# Patient Record
Sex: Female | Born: 1935 | Race: White | Hispanic: No | Smoking: Former smoker
Health system: Southern US, Community
[De-identification: ages and names within clinical notes are randomized; demographics above are authoritative.]

## PROBLEM LIST (undated history)

## (undated) DIAGNOSIS — Z8669 Personal history of other diseases of the nervous system and sense organs: Secondary | ICD-10-CM

## (undated) DIAGNOSIS — K5790 Diverticulosis of intestine, part unspecified, without perforation or abscess without bleeding: Secondary | ICD-10-CM

## (undated) DIAGNOSIS — F32A Depression, unspecified: Secondary | ICD-10-CM

## (undated) DIAGNOSIS — M316 Other giant cell arteritis: Secondary | ICD-10-CM

## (undated) DIAGNOSIS — E785 Hyperlipidemia, unspecified: Secondary | ICD-10-CM

## (undated) DIAGNOSIS — Z8719 Personal history of other diseases of the digestive system: Secondary | ICD-10-CM

## (undated) DIAGNOSIS — F329 Major depressive disorder, single episode, unspecified: Secondary | ICD-10-CM

## (undated) DIAGNOSIS — Z87442 Personal history of urinary calculi: Secondary | ICD-10-CM

## (undated) DIAGNOSIS — K449 Diaphragmatic hernia without obstruction or gangrene: Secondary | ICD-10-CM

## (undated) DIAGNOSIS — Z8616 Personal history of COVID-19: Secondary | ICD-10-CM

## (undated) DIAGNOSIS — K589 Irritable bowel syndrome without diarrhea: Secondary | ICD-10-CM

## (undated) DIAGNOSIS — Z860100 Personal history of colon polyps, unspecified: Secondary | ICD-10-CM

## (undated) DIAGNOSIS — C4490 Unspecified malignant neoplasm of skin, unspecified: Secondary | ICD-10-CM

## (undated) DIAGNOSIS — C50919 Malignant neoplasm of unspecified site of unspecified female breast: Secondary | ICD-10-CM

## (undated) HISTORY — DX: Personal history of other diseases of the digestive system: Z87.19

## (undated) HISTORY — DX: Diverticulosis of intestine, part unspecified, without perforation or abscess without bleeding: K57.90

## (undated) HISTORY — DX: Depression, unspecified: F32.A

## (undated) HISTORY — DX: Hyperlipidemia, unspecified: E78.5

## (undated) HISTORY — DX: Personal history of urinary calculi: Z87.442

## (undated) HISTORY — DX: Personal history of other diseases of the nervous system and sense organs: Z86.69

## (undated) HISTORY — DX: Personal history of colonic polyps: Z86.010

## (undated) HISTORY — DX: Malignant neoplasm of unspecified site of unspecified female breast: C50.919

## (undated) HISTORY — DX: Irritable bowel syndrome, unspecified: K58.9

## (undated) HISTORY — DX: Personal history of colon polyps, unspecified: Z86.0100

## (undated) HISTORY — DX: Diaphragmatic hernia without obstruction or gangrene: K44.9

## (undated) HISTORY — DX: Unspecified malignant neoplasm of skin, unspecified: C44.90

## (undated) HISTORY — DX: Other giant cell arteritis: M31.6

## (undated) HISTORY — DX: Major depressive disorder, single episode, unspecified: F32.9

## (undated) HISTORY — DX: Personal history of COVID-19: Z86.16

---

## 1981-05-29 HISTORY — PX: MASTECTOMY: SHX3

## 1983-05-30 DIAGNOSIS — C50919 Malignant neoplasm of unspecified site of unspecified female breast: Secondary | ICD-10-CM

## 1983-05-30 HISTORY — DX: Malignant neoplasm of unspecified site of unspecified female breast: C50.919

## 1998-05-29 HISTORY — PX: AUGMENTATION MAMMAPLASTY: SUR837

## 1998-10-28 ENCOUNTER — Other Ambulatory Visit: Admission: RE | Admit: 1998-10-28 | Discharge: 1998-10-28 | Payer: Self-pay | Admitting: Internal Medicine

## 1998-10-28 ENCOUNTER — Encounter: Payer: Self-pay | Admitting: Internal Medicine

## 1998-10-28 DIAGNOSIS — K449 Diaphragmatic hernia without obstruction or gangrene: Secondary | ICD-10-CM | POA: Insufficient documentation

## 1998-10-28 DIAGNOSIS — K219 Gastro-esophageal reflux disease without esophagitis: Secondary | ICD-10-CM | POA: Insufficient documentation

## 1998-10-28 DIAGNOSIS — D126 Benign neoplasm of colon, unspecified: Secondary | ICD-10-CM | POA: Insufficient documentation

## 1999-10-17 ENCOUNTER — Other Ambulatory Visit: Admission: RE | Admit: 1999-10-17 | Discharge: 1999-10-17 | Payer: Self-pay | Admitting: Obstetrics and Gynecology

## 2000-11-20 ENCOUNTER — Other Ambulatory Visit: Admission: RE | Admit: 2000-11-20 | Discharge: 2000-11-20 | Payer: Self-pay | Admitting: Obstetrics and Gynecology

## 2001-08-05 ENCOUNTER — Encounter: Payer: Self-pay | Admitting: Internal Medicine

## 2001-08-05 ENCOUNTER — Ambulatory Visit (HOSPITAL_COMMUNITY): Admission: RE | Admit: 2001-08-05 | Discharge: 2001-08-05 | Payer: Self-pay | Admitting: Internal Medicine

## 2001-12-17 ENCOUNTER — Other Ambulatory Visit: Admission: RE | Admit: 2001-12-17 | Discharge: 2001-12-17 | Payer: Self-pay | Admitting: Obstetrics and Gynecology

## 2004-03-02 ENCOUNTER — Other Ambulatory Visit: Admission: RE | Admit: 2004-03-02 | Discharge: 2004-03-02 | Payer: Self-pay | Admitting: Obstetrics and Gynecology

## 2007-02-28 ENCOUNTER — Ambulatory Visit: Payer: Self-pay | Admitting: Internal Medicine

## 2007-03-08 ENCOUNTER — Ambulatory Visit: Payer: Self-pay | Admitting: Internal Medicine

## 2007-03-08 DIAGNOSIS — K5730 Diverticulosis of large intestine without perforation or abscess without bleeding: Secondary | ICD-10-CM | POA: Insufficient documentation

## 2007-05-30 HISTORY — PX: LAPAROSCOPIC CHOLECYSTECTOMY: SUR755

## 2007-08-05 DIAGNOSIS — Z8719 Personal history of other diseases of the digestive system: Secondary | ICD-10-CM

## 2007-08-05 DIAGNOSIS — M810 Age-related osteoporosis without current pathological fracture: Secondary | ICD-10-CM | POA: Insufficient documentation

## 2007-08-05 DIAGNOSIS — M316 Other giant cell arteritis: Secondary | ICD-10-CM | POA: Insufficient documentation

## 2007-08-31 ENCOUNTER — Encounter (INDEPENDENT_AMBULATORY_CARE_PROVIDER_SITE_OTHER): Payer: Self-pay | Admitting: *Deleted

## 2007-08-31 ENCOUNTER — Inpatient Hospital Stay (HOSPITAL_COMMUNITY): Admission: EM | Admit: 2007-08-31 | Discharge: 2007-09-04 | Payer: Self-pay | Admitting: Emergency Medicine

## 2007-09-02 ENCOUNTER — Encounter (INDEPENDENT_AMBULATORY_CARE_PROVIDER_SITE_OTHER): Payer: Self-pay | Admitting: *Deleted

## 2007-09-03 ENCOUNTER — Encounter (INDEPENDENT_AMBULATORY_CARE_PROVIDER_SITE_OTHER): Payer: Self-pay | Admitting: Surgery

## 2007-09-04 ENCOUNTER — Encounter (INDEPENDENT_AMBULATORY_CARE_PROVIDER_SITE_OTHER): Payer: Self-pay | Admitting: *Deleted

## 2007-09-05 ENCOUNTER — Ambulatory Visit: Payer: Self-pay | Admitting: Internal Medicine

## 2007-12-20 ENCOUNTER — Telehealth: Payer: Self-pay | Admitting: Internal Medicine

## 2008-01-20 ENCOUNTER — Ambulatory Visit: Payer: Self-pay | Admitting: Internal Medicine

## 2008-04-28 ENCOUNTER — Telehealth: Payer: Self-pay | Admitting: Internal Medicine

## 2008-05-07 ENCOUNTER — Ambulatory Visit: Payer: Self-pay | Admitting: Internal Medicine

## 2008-05-08 LAB — CONVERTED CEMR LAB
ALT: 15 units/L (ref 0–35)
AST: 21 units/L (ref 0–37)
Albumin: 3.9 g/dL (ref 3.5–5.2)
Amylase: 110 units/L (ref 27–131)
Total Bilirubin: 1 mg/dL (ref 0.3–1.2)

## 2008-05-14 ENCOUNTER — Encounter: Payer: Self-pay | Admitting: Internal Medicine

## 2008-05-15 ENCOUNTER — Telehealth: Payer: Self-pay | Admitting: Internal Medicine

## 2008-05-26 ENCOUNTER — Telehealth: Payer: Self-pay | Admitting: Internal Medicine

## 2008-05-27 ENCOUNTER — Ambulatory Visit: Payer: Self-pay | Admitting: Internal Medicine

## 2008-05-27 ENCOUNTER — Telehealth: Payer: Self-pay | Admitting: Nurse Practitioner

## 2008-05-27 DIAGNOSIS — R110 Nausea: Secondary | ICD-10-CM

## 2008-05-27 DIAGNOSIS — K859 Acute pancreatitis without necrosis or infection, unspecified: Secondary | ICD-10-CM

## 2008-05-27 DIAGNOSIS — R197 Diarrhea, unspecified: Secondary | ICD-10-CM

## 2008-05-27 DIAGNOSIS — R1013 Epigastric pain: Secondary | ICD-10-CM

## 2008-05-27 LAB — CONVERTED CEMR LAB
BUN: 12 mg/dL (ref 6–23)
Chloride: 104 meq/L (ref 96–112)
Creatinine, Ser: 0.9 mg/dL (ref 0.4–1.2)
GFR calc non Af Amer: 66 mL/min

## 2008-06-02 ENCOUNTER — Encounter: Payer: Self-pay | Admitting: Internal Medicine

## 2008-06-08 ENCOUNTER — Ambulatory Visit: Payer: Self-pay | Admitting: Internal Medicine

## 2008-06-16 ENCOUNTER — Ambulatory Visit: Payer: Self-pay | Admitting: Internal Medicine

## 2008-06-16 ENCOUNTER — Encounter: Payer: Self-pay | Admitting: Internal Medicine

## 2008-06-17 ENCOUNTER — Encounter: Payer: Self-pay | Admitting: Internal Medicine

## 2008-09-09 ENCOUNTER — Telehealth: Payer: Self-pay | Admitting: Internal Medicine

## 2010-01-17 ENCOUNTER — Encounter: Payer: Self-pay | Admitting: Internal Medicine

## 2010-01-17 ENCOUNTER — Other Ambulatory Visit: Payer: Self-pay | Admitting: Internal Medicine

## 2010-01-17 ENCOUNTER — Telehealth: Payer: Self-pay | Admitting: Internal Medicine

## 2010-01-18 ENCOUNTER — Ambulatory Visit: Payer: Self-pay | Admitting: Internal Medicine

## 2010-01-18 ENCOUNTER — Encounter: Payer: Self-pay | Admitting: Internal Medicine

## 2010-06-30 NOTE — Progress Notes (Signed)
Summary: triage   Phone Note Call from Patient Call back at Home Phone [phone]   Caller: Patient Call For: Dr Juanda Chance Reason for Call: Talk to Nurse Summary of Call: Patient wants to be seen sooner than first available 9-21 for heaviness on her chest right between her breast, feels like a giant bubble that wont go away. Patient states that she took other family member Nexium and it helperd a little but concern because of the heaviness x 5 days. Initial call taken by: Tawni Levy,  January 17, 2010 10:26 AM  Follow-up for Phone Call        Patient  has had 5 day hx of "indigestions",  "feels like a large gas bubble that won't go away".  Descirbes pain as a heavyness and worse when she lifts her arms.  She is tired also. She started herself on Nexium this weekend with very minimal relief, "it is just not getting better".   Patient questioned about her cardiac hx and she states she has none.  She is asked to see her primary care MD today or go to an ER for evaluation to r/o cardiac causes of CP.  I have asked her to call me back if a cardiac cause of her pain has been ruled out to see Dr Juanda Chance.  Patient verbalized understanding and will call her primary care now. Follow-up by: Darcey Nora RN, CGRN,  January 17, 2010 11:16 AM  Additional Follow-up for Phone Call Additional follow up Details #1::        I have reviewed her chart. last EGD for similar Sx's 05/2008. ? bile gastritis? vs functional dyspepsia. I agree with being checked by her internist. If she is referred back to [place], please send Carafate 1 gm by mouth two times a day, #40, 1 refill and refill Dicyclomine 10 mg, # 40 1 by mouth two times a day, 1 refill. Additional Follow-up by: Hart Carwin MD,  January 17, 2010 7:04 PM    Additional Follow-up for Phone Call Additional follow up Details #2::    I called and spoke with the patient she is scheduled for a stress test this am.  I reviewed carafate and dicyclomine with her she  has both at home, and she will start on this.  She wants to wait to scheduled an appointment until after her stress test today. Follow-up by: Darcey Nora RN, CGRN,  January 18, 2010 8:47 AM  Additional Follow-up for Phone Call Additional follow up Details #3:: Details for Additional Follow-up Action Taken: I agree Additional Follow-up by: Hart Carwin MD,  January 18, 2010 9:21 PM

## 2010-10-11 NOTE — Discharge Summary (Signed)
NAMEBERTIE, MCCONATHY           ACCOUNT NO.:  [id number]   MEDICAL RECORD NO.:  [id number]          PATIENT TYPE:  INP   LOCATION:  5123                         FACILITY:  [facility]   PHYSICIAN:  Rachael Fee, MD   DATE OF BIRTH:  Apr 03, 1935   DATE OF ADMISSION:  08/30/2007  DATE OF DISCHARGE:  09/04/2007                               DISCHARGE SUMMARY   ADMITTING DIAGNOSES:  1. Biliary pancreatitis.  2. History of diverticulosis.  3. History of temporal arteritis with secondary myalgias.  4. Remote history of breast cancer with bilateral mastectomy and      reconstruction at least 25 years previously.  5. She has a history of adenomatous colon polyps and in her latest      colonoscopy October 2003, she had some mild diverticular changes in      the left colon.  The study itself was incomplete and terminated at      the hepatic flexure.  A PE of October 2003 showed markedly      redundant colon, but no masses.   DISCHARGE DIAGNOSES:  1. Biliary pancreatitis, resolved, probably passed a common bile duct      stone.  The patient did not require ERCP or MRCP.  2. Gallstones.  3. Status post laparoscopic cholecystectomy on September 03, 2007, by Dr.      Darnell Level.  Intraoperative cholangiogram showed no abnormality      nor filling defects.  4. Hyperglycemia in the setting of acute pancreatitis, the sugars were      normalizing as the patient's pancreatitis resolved.   CONSULTATIONS:  Dr. Darnell Level from general surgery.   PROCEDURES:  Laparoscopic cholecystectomy with intraoperative  cholangiogram by Dr. Gerrit Friends.   BRIEF HISTORY:  Ms. Elgin is a nice 76 year old female who presented  to the emergency room after 24 hours of intense, 10/10, epigastric  abdominal pain.  This began acutely.  It did not radiate.  It was  associated with nausea.  She did not throw up until she arrived at the  emergency room where she had a single episode of emesis.  She had no  fevers or  chills, and she denied diarrhea.  Up until the time of the  acute symptoms, she had been doing well and did not require any antacid  therapy.  She had not treated herself with any particular medication at  home prior to coming to the emergency room.  She was anorexic and she  had not been eating much in the last 24 hours prior to arrival.  A CT  scan of the abdomen and pelvis showed an inflammatory process centered  around the gallbladder and pancreas.  Differential included acute  cholecystitis and pancreatitis.  There was a large gallstone in the  gallbladder fundus.  There was no pericholecystic fluid.  The common  bile duct measured 8 mm.  There was no obvious stone in the common bile  duct.  The pancreatic duct and intrahepatic ducts were of normal  diameter and appearance.  The patient did then have an ultrasound of the  abdomen showing gallstones.  No gallbladder wall  thickening and a  prominent common bile duct, but no visualization of common duct stone.  Her pancreatic enzymes were particularly elevated with the initial  lipase measuring 4277.  LFTs were not terribly elevated.  The bilirubin  was 1.1, alkaline phos was normal.  The AST was 47, and the ALT was 30.  The patient was initially seen and was to be admitted by the [facility] team.  Dr. Christella Hartigan have the patient transferred to the [facility]  service because this was obviously a very focused GI problem.   IMAGING STUDIES:  Those are noted above in the HPI.  The intraoperative  cholangiogram showed no evidence for retained common bile duct stone.   LABORATORIES:  Initial white blood cell count 6.6.  It was 6.0 on  recheck.  Hemoglobin 12, hematocrit 35.2.  MCV 91.1.  Platelets 231,000.  Sodium 142, potassium 3.6.  Chloride 108, CO2 27.  Glucose ranged from a  high of 153 down to a low of 110.  BUN was 13, creatinine 0.9 on  admission, on recheck the BUN was 3, and creatinine 0.7.  Total  bilirubin went from 1.1 to 0.9.   Alkaline phosphatase range was 79 to  96.  AST range 18 to 47.  ALT range 15 to 30.  Albumin nadir of 2.7.  Calcium 8.2.  Amylase went from 454 to 106.  Lipase dropped from 4277 to  38.  Urinalysis was negative.   HOSPITAL COURSE:  1. Biliary pancreatitis.  The patient was initially kept n.p.o. with      the exception of ice chips.  Diet was advanced to clear liquids.      She then had surgery and after surgery she was tolerating a regular      diet.  The patient required some pain control, but quickly      decelerated in her need for p.r.n. Dilaudid.  She had no further      episodes of vomiting.  Chemistries to follow her pancreatic enzymes      and LFTs were obtained regularly and showed consistent improvement.      Because the LFTs were never significantly elevated, and she      improved so quickly,  decision was made to study the bile duct with      an intraoperative cholangiogram.  This study failed to confirm any      problems with the common bile duct.  It is felt that she probably      had a gallstone which she passed on her own.  2. Cholelithiasis.  The patient underwent laparoscopic cholecystectomy      without incidence on April 7.  By April 8, she was feeling great.      She tolerated clear liquid diet and then a regular diet and was      felt safe and ready for discharge home in improved condition in the      afternoon of April 8.  3. Hyperglycemia.  The patient's serum blood glucose did reach a high      of 153 on April 3 when she first came in.  Thereafter, on recheck,      it was 110 and then 116.  Given her age and the general      population's proclivity for diabetes mellitus type 2, she will need      to be monitored for elevated blood sugars on routine office visits      in the future.  She  did not require any treatment with sliding      scale insulin during this admission.   MEDICATIONS AT DISCHARGE:  1. Ibuprofen 600 mg one p.o. t.i.d. p.r.n. A prescription for  30 of      these without refills was provided by surgery.  2. Vicodin 5/325 mg one to two p.o. q.4 h. p.r.n. pain, 40 of these      were dispensed without refills by general surgery.  3. Bentyl 50 mg one p.o. q. 6 h. p.r.n.  4. Prempro 0.425 mg one p.o. daily.  5. Ultracet one p.o. up to t.i.d. p.r.n. not to be used in conjunction      with the Vicodin.  This was a preexisting prescription.  6. Diet, regular.  If she has diarrhea, she should start avoiding      excessive fatty foods in the diet.   FOLLOW-UP APPOINTMENTS:  Followup appointment with Dr. Gerrit Friends.  The  appointment will be arranged, but occur on or about April 14.  The  surgery office will call the patient with the appointment.  The patient  is to call the surgeon if she has fever over 101.5, new or increased  belly pain, nausea, vomiting, diarrhea, or constipation, or any bloody  or pussy looking discharge from the wounds.  She can return to work in 2  weeks.  She can also apply ice pack to the incision if she has soreness  or discomfort that is milder in nature.  The patient is allowed to  shower  beginning September 05, 2007 at which point it will be okay to remove the  outer bandages.  The surgical tape strips should be allowed to fall off  on their own.  She is not to lift anything more than 10 pounds for 2  weeks and not drive for 1 week.      Jennye Moccasin, PA-C      Rachael Fee, MD  Electronically Signed    SG/MEDQ  D:  09/04/2007  T:  09/05/2007  Job:  [id number]   cc:   Erven Colla. Juanda Chance, MD  Velora Heckler, MD

## 2010-10-11 NOTE — Op Note (Signed)
[name], [name]           ACCOUNT NO.:  [id number]   MEDICAL RECORD NO.:  [id number]          PATIENT TYPE:  INP   LOCATION:  5123                         FACILITY:  [facility]   PHYSICIAN:  Velora Heckler, MD      DATE OF BIRTH:  06-12-1934   DATE OF PROCEDURE:  09/03/2007  DATE OF DISCHARGE:                               OPERATIVE REPORT   PREOPERATIVE DIAGNOSES:  1. Biliary pancreatitis.  2. Cholelithiasis.   POSTOPERATIVE DIAGNOSES:  1. Biliary pancreatitis.  2. Cholelithiasis.   PROCEDURE:  Laparoscopic cholecystectomy with intraoperative  cholangiography.   SURGEON:  Velora Heckler, MD, FACS   ASSISTANT:  Magnus Ivan, RNFA   ANESTHESIA:  General.   ESTIMATED BLOOD LOSS:  Minimal.   PREPARATION:  Betadine.   COMPLICATIONS:  None.   INDICATIONS:  The patient is a 76 year old white female admitted on the  medical service with abdominal pain.  Workup revealed pancreatitis with  an elevated serum lipase level.  Ultrasound showed multiple gallstones.  The patient was seen in consultation by gastroenterology and by general  surgery.  The patient's laboratory studies normalized over the ensuing  48 hours.  A decision was made to proceed with cholecystectomy with  cholangiography and, if necessary, follow that with ERCP.   BODY OF REPORT:  The procedure was done in OR #17 at the [facility].  The patient is brought to the operating room, placed  in a supine position on the operating room table.  Following  administration of general anesthesia, the patient is prepped and draped  in the usual strict aseptic fashion.  After ascertaining that an  adequate level of anesthesia had been achieved, an infraumbilical  incision was made with a #15 blade.  Dissection is carried down to the  fascia.  Fascia is incised in the midline and the peritoneal cavity is  entered cautiously.  A 0 Vicryl pursestring suture is placed in the  fascia.  A Hasson cannula  is introduced and secured with a pursestring  suture.  Abdomen is insufflated with carbon dioxide.  Laparoscope is  introduced and the abdomen explored.  There are some adhesions  throughout the pelvis and lower midline.  The liver is multilobulated  but does not appear cirrhotic.  Operative ports are placed along the  right costal margin in the midline, midclavicular line and anterior  axillary line.  Fundus of the gallbladder is grasped and retracted  cephalad.  There are some omental adhesions to the undersurface of the  liver, which are gently taken down and hemostasis obtained with the  electrocautery.  The peritoneum is incised at the neck of the  gallbladder.  The cystic duct is dissected out.  The cystic artery is  dissected out, doubly clipped, and divided.  A clip is placed at the  junction to the cystic duct and the gallbladder.  The cystic duct is  incised.  Clear yellow bile emanates from the cystic duct.  A Cook  cholangiography catheter is introduced through a stab wound in the right  upper quadrant and inserted into the cystic duct.  It is secured with a  Ligaclip.  Using C-arm fluoroscopy, real-time cholangiography is  performed.  There is rapid filling of a dilated common bile duct.  There  is free flow distally into the duodenum.  There is reflux of contrast  into the distal pancreatic duct.  There is also reflux of contrast into  both the right and left hepatic ductal systems.  There are no filling  defects.  There is no sign of obstruction.  Clip is withdrawn, Cook  catheter is removed.  Cystic duct is clipped with four Ligaclips and  divided.  Posterior branch of the cystic artery is doubly clipped and  divided.  Gallbladder is then excised from the gallbladder bed using the  hook electrocautery for hemostasis.  Gallbladder is completely excised  and then removed through the umbilical port without difficulty.  It  contains multiple gallstones.  The 0 Vicryl  pursestring suture is tied  securely.  Right upper quadrant is irrigated with warm saline, which is  evacuated.  Good hemostasis is noted.  Ports are removed under direct  vision.  Good hemostasis is noted at all port sites.  Pneumoperitoneum  is released.  The port sites are anesthetized with local anesthetic.  All wounds are closed with interrupted 4-0 Vicryl subcuticular sutures.  Wounds are washed and dried and Benzoin and Steri-Strips are applied.  Sterile dressings are applied.  The patient is awakened from anesthesia  and brought to the recovery room in stable condition.  The patient  tolerated the procedure well.      Velora Heckler, MD  Electronically Signed     TMG/MEDQ  D:  09/03/2007  T:  09/03/2007  Job:  [id number]   cc:   Altha Harm, MD  Rachael Fee, MD

## 2010-10-11 NOTE — H&P (Signed)
[name], GUEST           ACCOUNT NO.:  [id number]   MEDICAL RECORD NO.:  [id number]          PATIENT TYPE:  EMS   LOCATION:  MAJO                         FACILITY:  [facility]   PHYSICIAN:  Altha Harm, MDDATE OF BIRTH:  01-Sep-1934   DATE OF ADMISSION:  08/30/2007  DATE OF DISCHARGE:                              HISTORY & PHYSICAL   CHIEF COMPLAINT:  Abdominal pain and vomiting.   HISTORY OF PRESENT ILLNESS:  This is a 76 year old lady who appears  younger than her stated age, who presents to the emergency room after 1  day of acute onset epigastric pain rated 10/10.  The patient states that  the pain is in the epigastric area, nonradiating and associated with  nausea.  The patient states that she initially did not have any emesis,  however just at her arrival to the emergency room, she had 1 episode of  emesis in the waiting room and 1 while in the emergency room.  The  patient denies any fever or chills.  She denies any diarrhea.   The patient has a particularly unremarkable past medical history except  for diverticulosis and temporal arteritis.  The patient has never had  any episodes like this in the past.   PAST MEDICAL HISTORY:  As noted above, significant for:  1. Diverticulosis.  2. History of temporal arteritis with resultant myalgias.  3. Bilateral mastectomy approximately 25 years ago for breast cancer.   FAMILY HISTORY:  Not relevant in a patient at this age.   SOCIAL HISTORY:  The patient resides by herself.  She denies any  tobacco, alcohol or drug use.   CURRENT MEDICATIONS:  1. Bentyl.  2. Prempro.  3. Ultracet, dose is unknown.   ALLERGIES:  NEOSPORIN.   PRIMARY CARE PHYSICIAN:  Yates Decamp, MD in [place], [place].   GASTROENTEROLOGIST:  Hedwig Morton. Juanda Chance, MD.   REVIEW OF SYSTEMS:  The 37 systems are reviewed and all systems are  negative except as noted in the HPI.   DIAGNOSTICS:  The patient had a CT of the abdomen and pelvis  with  contrast which showed inflammatory process centered around the  gallbladder and pancreas.  Differential includes acute cholecystitis and  pancreatitis including gallstone pancreatitis.  Stranding around the  hepatic flexure likely related to the gallbladder or pancreas.  Less  likely diverticulitis.  No acute pelvic process.  There is a large 14 mm  gallstone in the gallbladder fundus.  The gallbladder wall is hyperemic.  The gallbladder is not dilated.  There is pericholecystic fluid.  The  common bile duct is dilated 8 mm to the pancreatic head.  The pancreas  itself does not demonstrate ductal dilatation.   LABORATORY DATA:  White blood cell count 6.6, hemoglobin 14.3,  hematocrit 42.1, platelets 302, sodium 137, potassium 3.9, chloride 101,  bicarb 26, BUN 13, creatinine 0.93, lipase 4,277.  Urinalysis was done  with no elements present consistent with urinary tract infection.   PHYSICAL EXAMINATION:  GENERAL:  The patient is lying in bed comfortably  and nontoxic appearing.  VITAL SIGNS:  Temperature 98.4, blood pressure 125/52, heart  rate 79,  respiratory rate 16, sats 98% on room air.  HEENT:  She is normocephalic, atraumatic.  Pupils are equal, round, and  reactive to light and accommodation.  Extraocular movements are intact.  Fundi are benign.  Oropharynx is moist.  No exudate, erythema or lesions  are noted.  NECK:  Trachea is midline.  No masses.  No thyromegaly.  No JVD.  No  carotid bruit.  RESPIRATORY:  The patient has a normal respiratory effort.  Equal  excursion bilaterally.  No wheezing or rhonchi noted.  CARDIOVASCULAR:  She has got a normal S1 and S2.  No murmurs, rubs or  gallops are noted.  PMI is nondisplaced.  No heaves or thrills on  palpation.  ABDOMEN:  Obese, soft.  The patient has epigastric tenderness.  Negative  Murphy's sign.  No masses.  No hepatosplenomegaly noted.  The patient  has decreased bowel sounds.  LYMPH NODE SURVEY:  She has got no  cervical, axillary or inguinal  lymphadenopathy noted.  MUSCULOSKELETAL:  She has got no warmth, swelling or erythema around the  joints.  Mild kyphosis noted, but no spinal tenderness.  PSYCHIATRIC:  She is alert and oriented x 3.  Good insight and  cognition.  Good recent and remote recall.  NEUROLOGIC:  She has got no focal neurological deficits.  Cranial nerves  II-XII are grossly intact.  Sensation is intact to light touch and  proprioception.  Strength is 4/4 in bilateral upper and lower  extremities.  DTRs are 2+ bilaterally in the lower extremity.   IMPRESSION:  This is a patient who presents with gallstone pancreatitis.  The patient will be made n.p.o. for right now as the pain appears to be  significant.  The patient will be given IV fluid hydration and pain  medication.  The patient seems to be responding well to boluses of pain  on a p.r.n. basis and I will not start the patient on a PCA pump.  However if the pain escalates, then we will proceed to a PCA pump for  better pain control.  I will also seek a GI and possibly speak to the  surgeons about removal of the gallbladder.  However in the meantime, we  will get an ultrasound of the abdomen to better evaluate the gallbladder  and seek better dimensions of the dilatation.  The patient will be  admitted to a med/surge bed.  Further decisions on care will be  dependent upon the initial response to therapy.      Altha Harm, MD  Electronically Signed     MAM/MEDQ  D:  08/31/2007  T:  08/31/2007  Job:  [id number]   cc:   Hedwig Morton. Juanda Chance, MD

## 2010-10-11 NOTE — Assessment & Plan Note (Signed)
[facility]                         GASTROENTEROLOGY OFFICE NOTE   NAME:Solazzo, SHAWNTAVIA SAUNDERS                  MRN:          [id number]  DATE:02/28/2007                            DOB:          09-Nov-1934    Ms. Sobotta is a very nice 76 year old white female here to discuss  repeat colonoscopy.  She had an acute episode of gastroenteritis the  first week in August, which was characterized by nausea, vomiting, and  some diarrhea.  She stayed in bed for about three days but slowly  recovered eventually, and she is now back to normal except for  occasional belching and flatulence.  She denies any rectal bleeding.  Her weight has stabilized.  In fact, she has gradually put on about 10  pounds since I saw her the last time, five years ago.   She has a positive family history of colon cancer in her mother and a  personal history of adenomatous polyp of the colon, based on the  colonoscopy in June, 2000.  The last colonoscopy in 2003 was incomplete  due to hepatic flexure because of tortuosity.  She has subsequently had  a barium enema which showed a normal right colon, but she did have a  marked redundancy of the colon with some diverticulosis documented on  barium enema.   MEDICATIONS:  1. Tramadol 50 mg p.o. daily.  2. Prednisone, started recently for temporal arteritis, 5 mg p.o.      daily.  3. Prempro of 0.45 mg p.o. daily.  4. Multivitamins.  5. Calcium supplements.  6. Vitamin D.  7. Boniva, monthly.   PHYSICAL EXAMINATION:  Blood pressure 144/52, pulse 80.  Weight 155  pounds.  Alert and oriented in no distress.  She did not appear to be cushingoid.  LUNGS:  Clear to auscultation.  COR:  Normal S1 and S2.  ABDOMEN:  Soft with normoactive bowel sounds.  No tenderness.  Left and  right lower quadrants were normal.  RECTAL:  Not done.  EXTREMITIES:  No edema.   IMPRESSION:  92. A 76 year old white female, status post recent  gastroenteritis,      fully recovered.  2. A positive family history of colon cancer in a direct relative.      Patient is due for repeat colonoscopy.  3. A personal history of tubular adenoma of the colon on colonoscopy      in June, 2000.   PLAN:  1. Colonoscopy is scheduled.  We will use a pediatric scope to      facilitate the passage of the scope into the cecum.  She prefers to      have an Osmoprep to avoid using a large amount of liquids.  2. Activia yogurt or probiotic to stabilize bacterial flora.  3. Gaviscon 1 or 2 tablets p.r.n. indigestion.     Hedwig Morton. Juanda Chance, MD  Electronically Signed    DMB/MedQ  DD: 02/28/2007  DT: 02/28/2007  Job #: [id number]   cc:   Jeani Hawking

## 2010-10-11 NOTE — Consult Note (Signed)
[name], Robbins           ACCOUNT NO.:  [id number]   MEDICAL RECORD NO.:  [id number]          PATIENT TYPE:  INP   LOCATION:  5123                         FACILITY:  [facility]   PHYSICIAN:  Velora Heckler, MD      DATE OF BIRTH:  1935/03/22   DATE OF CONSULTATION:  09/02/2007  DATE OF DISCHARGE:                                 CONSULTATION   REASON FOR CONSULTATION:  Biliary pancreatitis and cholelithiasis.   HISTORY OF PRESENT ILLNESS:  [name] is a 76 year old female  patient admitted August 31, 2007 through the ER because of complaints of  abdominal pain.  She was found to have an elevated lipase to 4277.  CT  of the abdomen and pelvis revealed a small amount of fluid in Morison's  pouch and at the tail of the pancreas consistent with pancreatitis.  Ultrasound revealed gallbladder wall thickening, large gallstones some  up to 2.2 within the gallbladder, common bile duct dilated 10.4 mm,  pancreatic duct was normal.  The patient has been admitted by GI, placed  on clear liquids.  Her lipase has subsequently decreased to the normal  range of 35.  Amylase is mildly elevated 131.  Gastroenterology has  requested surgical evaluation for cholecystectomy.   REVIEW OF SYSTEMS:  As per the history present illness.  CONSTITUTIONAL:  No fevers.  No chills.  No myalgias.  GI:  The patient reports about 1  months' worth of postprandial indigestion and reflux.  She thought it  was because of the food she was eating.  She has had nocturnal symptoms  most frequently.  Otherwise review of systems all of the categories of  review of systems are within normal limits.   FAMILY MEDICAL HISTORY:  Significant for colon cancer.  No coronary  artery disease.   SOCIAL HISTORY:  The patient lives alone.  No alcohol.  No tobacco.   PAST MEDICAL HISTORY:  1. Diverticulosis.  2. Temporal arteritis and myalgias.  Question history of polymyalgia      rheumatica.   PAST SURGICAL HISTORY:   Bilateral mastectomy 25 years ago with breast  reconstruction and implant.   ALLERGIES:  NEOSPORIN.   CURRENT MEDICATIONS:  Premarin, Lovenox, Provera, Protonix, Dilaudid and  Zofran.   PHYSICAL EXAMINATION:  CONSTITUTIONAL/GENERAL:  Pleasant female patient  who appears much younger than stated age who still reports epigastric  pain, but no nausea or vomiting.  VITAL SIGNS:  Temperature 98.5, BP 120/65, pulse 78 and regular,  respirations 19.  EYES:  Sclerae are noninjected, nonicteric.  Conjunctivae are pink.  Pupils are equal and to light.  EARS, NOSE, MOUTH AND THROAT:  Ears are symmetrical in appearance.  Nose  is midline without rhinorrhea.  Mouth is pink and moist.  NECK:  Trachea is midline.  Thyroid is nonpalpable.  No appreciable  masses in the neck.  CHEST:  Respiratory effort is normal and nonlabored.  Bilateral lung  sounds are clear to auscultation anteriorly.  CARDIOVASCULAR:  Heart sounds are S1.  No rubs, murmurs, thrills.  No  gallops.  No JVD.  No peripheral edema.  Pulses regular and  non  tachycardiac.  Carotid, radial and pedal pulses are 1+ bilaterally.  CHEST AND BREASTS:  Exam was deferred.  GI:  Abdomen is soft.  Bowel sounds are present.  She is tender in the  epigastric region without rebound.  Minimal guarding.  No  hepatosplenomegaly noted.  LYMPHATIC EXAM:  Deferred.  MUSCULOSKELETAL:  Extremities symmetrical in appearance without clubbing  or cyanosis.  SKIN:  Normal in appearance without any obvious rashes, lesions, masses,  moles or scars of concern.  NEUROLOGIC:  Cranial nerves II-XII grossly intact.  Sensation in the  upper and lower extremities is grossly intact.  Gait and DTRs were not  assessed.  PSYCHIATRIC:  The patient is oriented to person, place, time and  situation.  Her affect is appropriate to current situation.   LABORATORY DATA:  Lipase is now 35, amylase 131.  LFTs are and have been  normal since admission.  Sodium 121, potassium  3.9, CO2 30, BUN 5,  creatinine 0.87, glucose 110.  White count 6900, hemoglobin 12.6,  platelets 229,000.  Diagnostic CT of the abdomen and pelvis and  ultrasound of the abdomen is noted in the history of present illness.   IMPRESSION:  1. Biliary pancreatitis, resolving.  2. Cholelithiasis.   PLAN:  1. I have discussed with Dr. Gerrit Friends who is in the OR.  Because the      patient's amylase is still mildly elevated and she is having      epigastric pain, I will defer operative intervention until this      normalizes.  2. Will go and follow up C-MET, CBC and pancreatic enzymes in the      morning.  3. She is to be n.p.o. after midnight in the event we can proceed with      operative intervention on September 03, 2007.  4. Hold Lovenox tonight.  She is due a 10:00 p.m. dose.      Allison L. Rennis Harding, N.P.      Velora Heckler, MD  Electronically Signed    ALE/MEDQ  D:  09/02/2007  T:  09/02/2007  Job:  [id number]   cc:   Rachael Fee, MD  Yates Decamp

## 2010-10-12 ENCOUNTER — Other Ambulatory Visit: Payer: Self-pay | Admitting: Internal Medicine

## 2010-10-14 ENCOUNTER — Other Ambulatory Visit: Payer: Self-pay | Admitting: Internal Medicine

## 2010-10-14 MED ORDER — DICYCLOMINE HCL 10 MG PO CAPS: 10.0000 mg | ORAL_CAPSULE | Freq: Three times a day (TID) | ORAL | Status: DC | PRN

## 2010-10-14 NOTE — Telephone Encounter (Signed)
Dr Juanda Chance- Patient would like refills of dicyclomine. She has not had any since 2010 (we have not seen her in the office since 2009 and for egd in 05-2008). She states that since "butterbeans and corn are coming into season" she will need more medicine. She has scheduled next available office visit for 11/28/10. Do you want me to refill rx until that date or have her wait until her appointment.

## 2010-10-14 NOTE — Telephone Encounter (Signed)
OK to refill Dicyclomine.

## 2010-10-14 NOTE — Procedures (Signed)
[facility]  Patient:    [name], [name] Visit Number: [id number] MRN: [id number]          Service Type: END Location: ENDO Attending Physician:  Mervin Hack Dictated by:   Hedwig Morton. Juanda Chance, M.D. [facility] Admit Date:  08/05/2001   CC:         Trevor Iha, M.D.  Yates Decamp, M.D., [place], [place]   Procedure Report  PROCEDURE:  Colonoscopy.  INDICATIONS:  This 76 year old white female has a positive family history of colon cancer in her mother and personal history of colon polyps on previous colonoscopy more than five years ago.  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  She is undergoing for follow-up of colon polyps as well as for colorectal screening.  INSTRUMENT:  Olympus single-channel videoscope.  PREMEDICATION:  Versed 10 mg IV, Demerol 100 mg IV.  DESCRIPTION OF PROCEDURE:  The Olympus single-channel videoscope was passed under direct vision through the rectum to the sigmoid colon.  The patient was monitored by pulse oximetry.  Oxygen saturations were normal.  The prep was excellent.  Anal canal and rectal ampulla were normal.  Sigmoid colon was somewhat tortuous with large hypertrophied folds but no definite diverticula. Descending colon appeared normal.  Colonoscope passed easily through descending colon up to the splenic flexure and transverse colon, but the hepatic flexure was difficult to negotiate.  The patient was turned in left and right decubitus positions.  Pressure was applied, but we were unable to advance around the hepatic flexure into the ascending colon.  Significant pressure was exerted to advance the scope.  At that point I thought there was too much risk in possible perforation.  At that point we terminated the procedure.  The patient tolerated the procedure well.  No polyps were found.  IMPRESSION: 1. Prediverticular changes of the left colon. 2. Incomplete exam to hepatic  flexure.  PLAN: 1. Barium enema unprepped today. 2. High fiber diet. 3. Repeat colon exam in five years versus barium enema.  This will be decided    at the time. Dictated by:   Hedwig Morton. Juanda Chance, M.D. [facility] Attending Physician:  Mervin Hack DD:  08/05/01 TD:  08/05/01 Job: [id number]

## 2010-11-28 ENCOUNTER — Ambulatory Visit (INDEPENDENT_AMBULATORY_CARE_PROVIDER_SITE_OTHER): Payer: Medicare Other | Admitting: Internal Medicine

## 2010-11-28 ENCOUNTER — Encounter: Payer: Self-pay | Admitting: Internal Medicine

## 2010-11-28 VITALS — BP 120/62 | HR 68 | Ht 66.0 in | Wt 158.0 lb

## 2010-11-28 DIAGNOSIS — K589 Irritable bowel syndrome without diarrhea: Secondary | ICD-10-CM

## 2010-11-28 DIAGNOSIS — R1013 Epigastric pain: Secondary | ICD-10-CM

## 2010-11-28 NOTE — Progress Notes (Signed)
[name] 04-12-1935 MRN [id number]        History of Present Illness:  This is a 76 year old white female with irritable bowel syndrome, temporal arteritis, status post bilateral mastectomy. She has a  family history of colon cancer in her mother and personal history of tubular  adenoma on colonoscopy in 2000. She has  a history of gallstone pancreatitis in 2009, s/p lap chole. She has no specific complaints other than weight gain or 13 pound, which she attributes to eating too much sweets. She claims that she lost her taste sensation while on Prednisone  for T.A. And only sweet taste satisfies her.. She has occasional leakage of stool and incomplete evacuation. Her last colonoscopy October 2008 showed moderately severe diverticulosis. Last upper endoscopy in January 2010 showed small reducible hiatal hernia and bile reflux. She does not take any PPI's.   Past Medical History  Diagnosis Date  . Diverticulosis   . Adenomatous colon polyp   . GERD (gastroesophageal reflux disease)   . Hiatal hernia   . IBS (irritable bowel syndrome)   . Osteoporosis   . Temporal arteritis   . History of pancreatitis    Past Surgical History  Procedure Date  . Mastectomy     bilateral  . Cholecystectomy     reports that she has quit smoking. She has never used smokeless tobacco. She reports that she does not drink alcohol or use illicit drugs. family history includes Colon cancer in her mother. Allergies  Allergen Reactions  . Triple Antibiotic     REACTION: blisters skin        Review of Systems: Occasional dysphagia. Epigastric discomfort in the mornings irregular bowel habits denies rectal bleeding  The remainder of the 10  point ROS is negative except as outlined in H&P   Physical Exam: General appearance  Well developed in no distress Eyes- non icteric HEENT nontraumatic, normocephalic Mouth no lesions, tongue papillated, no cheilosis Neck supple without adenopathy,  thyroid not enlarged, no carotid bruits, no JVD Lungs Clear to auscultation bilaterally Cor normal S1 normal S2, regular rhythm , no murmur,  quiet precordium Abdomen soft nontender abdomen with normal active bowel sounds. Normal liver at costal margin. No distention Rectal: Not done Extremities no pedal edema Skin no lesions Neurological alert and oriented x3 Psychological normal mood and  affect  Assessment and Plan:  Problems #1 family history of colon cancer. Will be due for repeat colonoscopy in October 2013  Problem #2 epigastric discomfort likely related to gastroesophageal reflux. Recommend Pepcid a.c. 20-40 mg at bedtime when necessary  Problem #3 leakage of stool. Suspect the rectocele and incompetent rectal sphincter. She will try  probiotic and fiber.   11/28/2010 [name]

## 2011-02-21 LAB — DIFFERENTIAL
Basophils Absolute: 0
Basophils Relative: 0
Basophils Relative: 0
Lymphocytes Relative: 15
Lymphocytes Relative: 21
Lymphocytes Relative: 23
Lymphs Abs: 1
Lymphs Abs: 1.4
Monocytes Absolute: 0.3
Monocytes Absolute: 0.6
Monocytes Relative: 10
Monocytes Relative: 4
Neutro Abs: 4
Neutro Abs: 4.8
Neutro Abs: 5.3
Neutrophils Relative %: 66
Neutrophils Relative %: 70

## 2011-02-21 LAB — COMPREHENSIVE METABOLIC PANEL
Albumin: 3 — ABNORMAL LOW
Albumin: 3.8
Alkaline Phosphatase: 79
Alkaline Phosphatase: 96
BUN: 13
BUN: 3 — ABNORMAL LOW
BUN: 5 — ABNORMAL LOW
Calcium: 8.2 — ABNORMAL LOW
Calcium: 9.4
Chloride: 104
Creatinine, Ser: 0.79
Creatinine, Ser: 0.87
Creatinine, Ser: 0.93
Glucose, Bld: 110 — ABNORMAL HIGH
Glucose, Bld: 116 — ABNORMAL HIGH
Potassium: 3.9
Total Bilirubin: 0.8
Total Protein: 5.7 — ABNORMAL LOW
Total Protein: 6.8

## 2011-02-21 LAB — LIPID PANEL
Cholesterol: 175
HDL: 50
LDL Cholesterol: 109 — ABNORMAL HIGH
Total CHOL/HDL Ratio: 3.5

## 2011-02-21 LAB — URINALYSIS, ROUTINE W REFLEX MICROSCOPIC
Glucose, UA: NEGATIVE
Hgb urine dipstick: NEGATIVE
Specific Gravity, Urine: 1.014

## 2011-02-21 LAB — HEPATIC FUNCTION PANEL
Albumin: 2.7 — ABNORMAL LOW
Indirect Bilirubin: 0.7
Total Protein: 5.5 — ABNORMAL LOW

## 2011-02-21 LAB — CBC
HCT: 35.2 — ABNORMAL LOW
HCT: 37.2
HCT: 42.1
Hemoglobin: 12
Hemoglobin: 12.6
MCHC: 34.2
MCV: 91.8
Platelets: 229
Platelets: 231
Platelets: 302
RDW: 12.8
RDW: 13.1
RDW: 13.3
WBC: 6.9

## 2011-02-21 LAB — LIPASE, BLOOD: Lipase: 90 — ABNORMAL HIGH

## 2011-02-21 LAB — AMYLASE: Amylase: 131

## 2011-04-03 ENCOUNTER — Other Ambulatory Visit: Payer: Self-pay | Admitting: Internal Medicine

## 2011-12-26 ENCOUNTER — Other Ambulatory Visit: Payer: Self-pay | Admitting: Obstetrics and Gynecology

## 2012-02-01 ENCOUNTER — Encounter: Payer: Self-pay | Admitting: Internal Medicine

## 2012-04-17 ENCOUNTER — Encounter: Payer: Self-pay | Admitting: Internal Medicine

## 2012-05-17 ENCOUNTER — Ambulatory Visit (AMBULATORY_SURGERY_CENTER): Payer: Medicare Other | Admitting: *Deleted

## 2012-05-17 VITALS — Ht 66.0 in | Wt 170.8 lb

## 2012-05-17 DIAGNOSIS — Z1211 Encounter for screening for malignant neoplasm of colon: Secondary | ICD-10-CM

## 2012-05-17 DIAGNOSIS — Z800 Family history of malignant neoplasm of digestive organs: Secondary | ICD-10-CM

## 2012-05-17 MED ORDER — MOVIPREP 100 G PO SOLR: ORAL | Status: DC

## 2012-06-11 ENCOUNTER — Ambulatory Visit (AMBULATORY_SURGERY_CENTER): Payer: Medicare Other | Admitting: Internal Medicine

## 2012-06-11 ENCOUNTER — Encounter: Payer: Self-pay | Admitting: Internal Medicine

## 2012-06-11 ENCOUNTER — Other Ambulatory Visit: Payer: Self-pay | Admitting: Internal Medicine

## 2012-06-11 VITALS — BP 164/67 | HR 86 | Temp 98.5°F | Resp 13 | Ht 66.0 in | Wt 170.0 lb

## 2012-06-11 DIAGNOSIS — D126 Benign neoplasm of colon, unspecified: Secondary | ICD-10-CM

## 2012-06-11 DIAGNOSIS — Z800 Family history of malignant neoplasm of digestive organs: Secondary | ICD-10-CM

## 2012-06-11 DIAGNOSIS — Z1211 Encounter for screening for malignant neoplasm of colon: Secondary | ICD-10-CM

## 2012-06-11 DIAGNOSIS — Z86010 Personal history of colonic polyps: Secondary | ICD-10-CM

## 2012-06-11 MED ORDER — SODIUM CHLORIDE 0.9 % IV SOLN: 500.0000 mL | INTRAVENOUS | Status: DC

## 2012-06-11 NOTE — Patient Instructions (Addendum)

## 2012-06-11 NOTE — Progress Notes (Signed)
To RR stable 

## 2012-06-11 NOTE — Progress Notes (Signed)
Called to room to assist during endoscopic procedure.  Patient ID and intended procedure confirmed with present staff. Received instructions for my participation in the procedure from the performing physician.  

## 2012-06-11 NOTE — Progress Notes (Signed)
Patient did not experience any of the following events: a burn prior to discharge; a fall within the facility; wrong site/side/patient/procedure/implant event; or a hospital transfer or hospital admission upon discharge from the facility. (G8907) Patient did not have preoperative order for IV antibiotic SSI prophylaxis. (G8918)  

## 2012-06-11 NOTE — Op Note (Signed)
[facility] [address] [place], [address]   COLONOSCOPY PROCEDURE REPORT  PATIENT: Maily, Debarge  MR#: [id number] BIRTHDATE: September 15, 1934 , 77  yrs. old GENDER: Female ENDOSCOPIST: Hart Carwin, MD REFERRED BY:  Yates Decamp, III, M.D. PROCEDURE DATE:  06/11/2012 PROCEDURE:   Colonoscopy with cold biopsy polypectomy ASA CLASS:   Class II INDICATIONS:Patient's immediate family history of colon cancer ,2000-adenomatous  polyp, prior colon 2003 and 2008, mother with colon cancer. MEDICATIONS: MAC sedation, administered by CRNA and propofol (Diprivan) 200mg  IV  DESCRIPTION OF PROCEDURE:   After the risks and benefits and of the procedure were explained, informed consent was obtained.  A digital rectal exam revealed no abnormalities of the rectum.    The LB [id number]-[id number]  endoscope was introduced through the anus and advanced to the cecum, which was identified by both the appendix and ileocecal valve .  The quality of the prep was good, using MoviPrep .  The instrument was then slowly withdrawn as the colon was fully examined.     COLON FINDINGS: Two sessile polyps ranging between 3-80mm in size were found.at 5 cm in the rectum  A polypectomy was performed with cold forceps.  The resection was complete and the polyp tissue was completely retrieved. There was mild diverticulosis of the sigmoid colon, haustral folds were hypertrophied    Retroflexed views revealed no abnormalities.     The scope was then withdrawn from the patient and the procedure completed.  COMPLICATIONS: There were no complications. ENDOSCOPIC IMPRESSION: Two sessile polyps ranging between 3-57mm in size were found; polypectomy was performed with cold forceps mild to moderate sigmoid diverticulosis- hypertrophied haustral folds, no obstruction  RECOMMENDATIONS: 1.  Await pathology results 2.  High fiber diet 3. Psyllium fiber 1 tsp daily   REPEAT EXAM: In 5 year(s)  for  Colonoscopy.  cc:  _______________________________ eSignedHart Carwin, MD 06/11/2012 11:05 AM     PATIENT NAME:  Therese, Rocco MR#: [id number]

## 2012-06-12 ENCOUNTER — Telehealth: Payer: Self-pay

## 2012-06-12 NOTE — Telephone Encounter (Signed)
Left message on answering machine. 

## 2012-06-17 ENCOUNTER — Encounter: Payer: Self-pay | Admitting: Internal Medicine

## 2012-08-11 ENCOUNTER — Observation Stay: Payer: Self-pay | Admitting: Internal Medicine

## 2012-08-11 ENCOUNTER — Ambulatory Visit: Payer: Self-pay | Admitting: Urology

## 2012-08-11 LAB — PROTIME-INR: INR: 0.9

## 2012-08-11 LAB — CBC WITH DIFFERENTIAL/PLATELET
Eosinophil #: 0 10*3/uL (ref 0.0–0.7)
MCH: 30.8 pg (ref 26.0–34.0)
MCHC: 33 g/dL (ref 32.0–36.0)
MCV: 93 fL (ref 80–100)
Monocyte %: 6.1 %
Platelet: 241 10*3/uL (ref 150–440)
RBC: 4.76 10*6/uL (ref 3.80–5.20)
RDW: 13.6 % (ref 11.5–14.5)

## 2012-08-11 LAB — URINALYSIS, COMPLETE
Bilirubin,UR: NEGATIVE
Blood: NEGATIVE
Ph: 9 (ref 4.5–8.0)
Protein: NEGATIVE
RBC,UR: <1 /HPF (ref 0–5)

## 2012-08-11 LAB — COMPREHENSIVE METABOLIC PANEL
Albumin: 3.7 g/dL (ref 3.4–5.0)
Anion Gap: 8 (ref 7–16)
BUN: 14 mg/dL (ref 7–18)
Co2: 26 mmol/L (ref 21–32)
Creatinine: 1.12 mg/dL (ref 0.60–1.30)
EGFR (African American): 56 — ABNORMAL LOW
EGFR (Non-African Amer.): 48 — ABNORMAL LOW
Potassium: 3.9 mmol/L (ref 3.5–5.1)
SGOT(AST): 26 U/L (ref 15–37)
Total Protein: 7.4 g/dL (ref 6.4–8.2)

## 2012-08-11 LAB — LIPASE, BLOOD: Lipase: 151 U/L (ref 73–393)

## 2012-08-12 LAB — BASIC METABOLIC PANEL
Anion Gap: 5 — ABNORMAL LOW (ref 7–16)
Co2: 28 mmol/L (ref 21–32)
Creatinine: 0.78 mg/dL (ref 0.60–1.30)
EGFR (African American): >60
Sodium: 140 mmol/L (ref 136–145)

## 2012-08-12 LAB — CBC WITH DIFFERENTIAL/PLATELET
Basophil #: 0 10*3/uL (ref 0.0–0.1)
Eosinophil #: 0.1 10*3/uL (ref 0.0–0.7)
HGB: 12.9 g/dL (ref 12.0–16.0)
Lymphocyte %: 31.1 %
MCV: 94 fL (ref 80–100)
Monocyte #: 0.5 x10 3/mm (ref 0.2–0.9)
Neutrophil #: 3.1 10*3/uL (ref 1.4–6.5)
Neutrophil %: 57.4 %
RDW: 13.5 % (ref 11.5–14.5)
WBC: 5.3 10*3/uL (ref 3.6–11.0)

## 2013-03-19 ENCOUNTER — Encounter: Payer: Self-pay | Admitting: Neurology

## 2013-03-19 ENCOUNTER — Ambulatory Visit (INDEPENDENT_AMBULATORY_CARE_PROVIDER_SITE_OTHER): Payer: Medicare Other | Admitting: Neurology

## 2013-03-19 VITALS — BP 163/67 | HR 75 | Ht 64.0 in | Wt 166.0 lb

## 2013-03-19 DIAGNOSIS — R430 Anosmia: Secondary | ICD-10-CM | POA: Insufficient documentation

## 2013-03-19 DIAGNOSIS — R439 Unspecified disturbances of smell and taste: Secondary | ICD-10-CM

## 2013-03-19 NOTE — Progress Notes (Signed)
[facility]    Provider:  Dr Hosie Poisson Referring Provider: Rafael Bihari, MD Primary Care Physician:  Elmo Putt, MD  CC:  Anosmia  HPI:  [name] is a 78 y.o. female here as a referral from Dr. Dan Humphreys for anosmia  First noted around 1 year ago, developed progressive loss of smell. Now completely gone. Also notes loss of sense of taste. Can taste sweet/acidic things but little else, specifically notes difficulty with tasting vegetables. Denies any headaches, no visual changes, no dizziness, no focal weakness or motor changes. No prior strokes or TIAs. Has history of chronic sinusitis and rhinorrhea. No known history of nasal polyps, no use of nasal sprays.   On long term steroids for temporal ateritis, notes having a cataract on right eye but no other visual concerns.   Review of Systems: Out of a complete 14 system review, the patient complains of only the following symptoms, and all other reviewed systems are negative. Positive for easy bruising swelling in legs ringing in ears  History   Social History  . Marital Status: Widowed    Spouse Name: N/A    Number of Children: 2  . Years of Education: college   Occupational History  . engineering   . Retired    Social History Main Topics  . Smoking status: Former Smoker    Quit date: 05/17/2005  . Smokeless tobacco: Never Used  . Alcohol Use: No  . Drug Use: No  . Sexual Activity: Not on file   Other Topics Concern  . Not on file   Social History Narrative   Patient lives at home alone.    Patient is retired.    Patient has some college.    Patient has 2 children.    Family History  Problem Relation Age of Onset  . Colon cancer Mother 78  . Stomach cancer Neg Hx     Past Medical History  Diagnosis Date  . Diverticulosis   . Adenomatous colon polyp   . GERD (gastroesophageal reflux disease)   . Hiatal hernia   . IBS (irritable bowel syndrome)   . Osteoporosis   .  Temporal arteritis   . History of pancreatitis     Past Surgical History  Procedure Laterality Date  . Mastectomy  1983    bilateral  . Laparoscopic cholecystectomy  2009    Current Outpatient Prescriptions  Medication Sig Dispense Refill  . ALPRAZolam (XANAX) 0.25 MG tablet As needed       . Biotin 1000 MCG tablet Take 1,000 mcg by mouth 2 (two) times daily.        . Cholecalciferol (VITAMIN D3) 1000 UNITS CAPS Take by mouth 2 (two) times daily.        . Cyanocobalamin (B-12 PO) Take by mouth daily.      Marland Kitchen dicyclomine (BENTYL) 10 MG capsule TAKE 1 CAPSULE (10 MG TOTAL) BY MOUTH 3 TIMES DAILY AS NEEDED  90 capsule  3  . predniSONE (DELTASONE) 5 MG tablet Take 5 mg by mouth daily.       Marland Kitchen PREMPRO 0.45-1.5 MG per tablet One daily by mouth       . traMADol (ULTRAM) 50 MG tablet One tablet by mouth twice daily        No current facility-administered medications for this visit.    Allergies as of 03/19/2013 - Review Complete 03/19/2013  Allergen Reaction Noted  . Neomycin-bacitracin zn-polymyx  06/08/2008    Vitals: BP 163/67  Pulse 75  Ht 5\' 4"  (1.626 m)  Wt 166 lb (75.297 kg)  BMI 28.48 kg/m2 Last Weight:  Wt Readings from Last 1 Encounters:  03/19/13 166 lb (75.297 kg)   Last Height:   Ht Readings from Last 1 Encounters:  03/19/13 5\' 4"  (1.626 m)     Physical exam: Exam: Gen: NAD, conversant Eyes: anicteric sclerae, moist conjunctivae HENT: Atraumatic, oropharynx clear, no visible nasal polyps Neck: Trachea midline; supple,  Lungs: CTA, no wheezing, rales, rhonic                          CV: RRR, no MRG Abdomen: Soft, non-tender;  Extremities: No peripheral edema  Skin: Normal temperature, no rash,  Psych: Appropriate affect, pleasant  Neuro: MS: AA&Ox3, appropriately interactive, normal affect   Attention: WORLD backwards  Speech: fluent w/o paraphasic error  Memory: good recent and remote recall  CN: Unable to smell coffee with either nostril.  Unable to distinguish taste of sugar bilaterally, PERRL, EOMI no nystagmus, no ptosis, sensation intact to LT V1-V3 bilat, face symmetric, no weakness, hearing grossly intact, palate elevates symmetrically, shoulder shrug 5/5 bilat,  tongue protrudes midline, no fasiculations noted.  Motor: normal bulk and tone Strength: 5/5  In all extremities  Coord: rapid alternating and point-to-point (FNF, HTS) movements intact.  Reflexes: symmetrical, bilat downgoing toes  Sens: LT intact in all extremities  Gait: posture, stance, stride and arm-swing normal. Tandem gait intact with some misteps. Able to walk on heels and toes. Romberg absent.   Assessment:  After physical and neurologic examination, review of laboratory studies, imaging, neurophysiology testing and pre-existing records, assessment will be reviewed on the problem list.  Plan:  Treatment plan and additional workup will be reviewed under Problem List.  1)Anosmia  78y/o presenting for initial evaluation of loss of sense of smell and taste. Physical exam pertinent for inability to distinguish smell of coffee and taste of sugar, otherwise unremarkable. Unclear etiology. Have low suspicion of central CNS process due to otherwise normal exam and lack of supporting features. Suspect likely related to chronic sinusitis and aging process. Would hold off on brain imaging at this time and reconsider if change in clinical picture. May benefit from ENT evaluation but patient wishes to hold off at this time. Follow up as needed.

## 2013-03-19 NOTE — Patient Instructions (Signed)
Overall you are doing fairly well but I do want to suggest a few things today:   Remember to drink plenty of fluid, eat healthy meals and do not skip any meals. Try to eat protein with a every meal and eat a healthy snack such as fruit or nuts in between meals. Try to keep a regular sleep-wake schedule and try to exercise daily, particularly in the form of walking, 20-30 minutes a day, if you can.   We will see you back as needed. Please call us with any interim questions, concerns, problems, updates or refill requests.   My clinical assistant and will answer any of your questions and relay your messages to me and also relay most of my messages to you.   Our phone number is [phone]. We also have an after hours call service for urgent matters and there is a physician on-call for urgent questions. For any emergencies you know to call 911 or go to the nearest emergency room

## 2013-04-12 ENCOUNTER — Ambulatory Visit: Payer: Self-pay | Admitting: Otolaryngology

## 2014-01-07 DIAGNOSIS — M810 Age-related osteoporosis without current pathological fracture: Secondary | ICD-10-CM | POA: Insufficient documentation

## 2014-01-07 DIAGNOSIS — K839 Disease of biliary tract, unspecified: Secondary | ICD-10-CM | POA: Insufficient documentation

## 2014-01-07 DIAGNOSIS — M1990 Unspecified osteoarthritis, unspecified site: Secondary | ICD-10-CM | POA: Insufficient documentation

## 2014-04-14 DIAGNOSIS — E785 Hyperlipidemia, unspecified: Secondary | ICD-10-CM | POA: Insufficient documentation

## 2014-07-27 DIAGNOSIS — Z85828 Personal history of other malignant neoplasm of skin: Secondary | ICD-10-CM | POA: Insufficient documentation

## 2014-07-27 DIAGNOSIS — Z87898 Personal history of other specified conditions: Secondary | ICD-10-CM | POA: Insufficient documentation

## 2014-09-18 NOTE — H&P (Signed)
PATIENT NAME:  [name], [name] MR#:  [id number] DATE OF BIRTH:  [birth year]  DATE OF ADMISSION:  08/11/2012  ADMITTING PHYSICIAN: Gladstone Lighter, MD   PRIMARY CARE PHYSICIAN: John B. Gilford Rile III, MD    CHIEF COMPLAINT: Abdominal pain.  HISTORY OF PRESENT ILLNESS: The patient is a pleasant 80 year old Caucasian female with past medical history significant for remote history of breast cancer, status post bilateral mastectomy and finished tamoxifen therapy, giant cell arteritis on maintenance prednisone treatment, presented to the hospital secondary to sudden onset of intense right lower quadrant abdominal pain associated with nausea and vomiting. The patient appears to be in pain and is resting after Dilaudid medication, and most of the history is obtained by daughter at bedside. According to daughter, the patient has been doing well up until this morning. She had her coffee this morning, went to the bathroom, had a bowel movement, following which she started having intense right lower quadrant abdominal pain associated with nausea and an episode of bilious vomiting when she came to the hospital.  She denies any fevers or chills. She did not eat anything unusual last night.  No recent travel.  Her face was completely flushed , lips blue according to daughter when she came in and she was writhing in pain in the bed.   Initial thought was she probably had perforated abdomen diverticulitis, and then she had a CT of the abdomen which showed a 4 mm right renal stone. She is being admitted for pain management secondary to renal colic. No prior history of renal stones. She had an impacted gallbladder stone resulting in biliary pancreatitis for which she required urgent cholecystectomy a few years ago. She has already used 1 dose of morphine and 2 doses of IV Dilaudid and still rates her pain as 6 to 7 out of 10 at this time. She is being admitted for pain management.   PAST MEDICAL HISTORY: 1. Giant cell  arteritis.  2. Osteoarthritis.  3. History of breast cancer, status post bilateral mastectomy.   PAST SURGICAL HISTORY: 1. Bilateral mastectomy.  2. Temporal artery biopsy.  3. Cholecystectomy.   HOME MEDICATIONS:   1. Prednisone 5 mg p.o. daily.  2. Tramadol 50 mg p.o. q. a.m. p.r.n. for pain.  3. Prempro 0.45 mg daily.  4. Multivitamin 1 tablet p.o. daily.  5. Xanax 0.25 mg p.r.n. for anxiety.  6. Bentyl 10 mg p.r.n. for abdominal cramps.   ALLERGIES: No known drug allergies.   SOCIAL HISTORY: She lives at home by herself, very independent at baseline. No smoking or alcohol use.   FAMILY HISTORY: Both parents died from old age. No significant heart disease or cancer is running in the family.   REVIEW OF SYSTEMS:   CONSTITUTIONAL: No fever, fatigue, or weakness.  EYES: No blurred vision, double vision, inflammation, glaucoma, or cataracts.  ENT: No tinnitus, ear pain, hearing loss, epistaxis or discharge.  RESPIRATORY: No cough, wheeze, hemoptysis or COPD.  CARDIOVASCULAR: No chest pain, orthopnea, edema, arrhythmia, palpitations or syncope.  GASTROINTESTINAL: Positive for nausea and vomiting. No diarrhea. Positive for abdominal pain. No hematemesis.  GENITOURINARY: No dysuria, hematuria. Positive for  renal calculus.  ENDOCRINE: No polyuria, nocturia, thyroid problems, heat or cold intolerance.  HEMATOLOGY: No anemia, easy bruising or bleeding.  SKIN: No acne, rash or lesions.  MUSCULOSKELETAL: No neck pain, back pain. Positive for numbness of the left foot that started during the pain.  Positive for arthritis.  NEUROLOGICAL: No CVA, transient ischemic attack, seizures or  ataxia.   PSYCHOLOGICAL: No anxiety, insomnia or depression.  PHYSICAL EXAMINATION: VITAL SIGNS: Temperature 97.5 degrees Fahrenheit, pulse 66, blood pressure 191/51, pulse oximetry 96% on room air. Respiratory rate is 20.   GENERAL: A well built, well nourished female lying in bed in moderate distress  secondary to pain.  HEENT: Normocephalic, atraumatic. Pupils are equal, round, reacting to light. Anicteric sclerae. Extraocular movements intact. Oropharynx clear without erythema, mass or exudates. Very dry mucous membranes seen.  NECK: Supple. No thyromegaly, JVD or carotid bruits.  No lymphadenopathy.  LUNGS: Moving air bilaterally. No wheeze or crackles. No use of accessory muscles for breathing.  CARDIOVASCULAR: S1, S2, regular rate and rhythm. No murmurs, rubs, or gallops.  ABDOMEN: Soft except for tenderness in the right lower quadrant radiating down to the groin with positive CVA tenderness.  Voluntary guarding present. No rigidity or rebound tenderness. Normal bowel sounds.  EXTREMITIES: No pedal edema. No clubbing or cyanosis, 2+ dorsalis pedis pulses palpable bilaterally.  SKIN: No acne, rash or lesions.  LYMPHATICS: No cervical or inguinal lymphadenopathy.  NEUROLOGIC: Cranial nerves intact. Motor power is 5 x 5 in all 4 extremities and 2+ deep tendon reflexes bilateral lower extremities, equal, and no sensory deficits.  PSYCHOLOGICAL: The patient is awake, alert, oriented x 3.   LABORATORY, DIAGNOSTIC AND RADIOLOGICAL DATA:  WBC 8.0, hemoglobin 14.6, hematocrit 44.3, platelet count 241 now.  Sodium 139, potassium 3.9, chloride 105, bicarbonate 26, BUN 14, creatinine 1.12, glucose 140, calcium of 8.6.  ALT 20, AST 26, alk phos 109, total bili 0.7, albumin of 3.7. INR is 0.9. Lipase is 151.   CT of the abdomen and pelvis showing a 4 mm calculus in the distal right ureter causing mild proximal obstruction. Extravasation of excreted contrast material into soft tissue adjacent to the right renal pelvis and right ureter is likely related to forniceal rupture.  Abdomen, flat and erect, showing nonobstructive bowel gas pattern.  Chest x-ray showing no acute cardiopulmonary disease.   ASSESSMENT AND PLAN:  A 80 year old female with no significant past medical history other than giant cell  arteritis and remote history of breast cancer, presents with severe right-sided abdominal pain and CT of the abdomen showing renal stone.   1. Renal colic with intractable pain, 4 mm stone which is nonobstructing other than mild dilatation of right ureter and right renal pelvis present:  We will admit under observation for pain management, start IV fluids, pain medications and nausea medications. Strain the urine for stone, and then if we catch the stone we will send it for test. Urology consult. No acute urological needs at this time per Dr. Maudie Mercury, who was consulted from the ER.  2. Temporal arteritis: Follow up with Dr. Jefm Bryant, on p.o. prednisone.  3. Arthritis: Tramadol p.r.n.  4. Hypertension: No known history of hypertension, elevated blood pressure here secondary to pain. We will do IV hydralazine p.r.n.  5. Gastrointestinal and deep vein thrombosis prophylaxis: On Protonix and TED stockings.   CODE STATUS:  FULL CODE.    TIME SPENT ON ADMISSION: 50 minutes.   ____________________________ Gladstone Lighter, MD rk:cb D: 08/11/2012 13:52:35 ET T: 08/11/2012 16:39:56 ET JOB#: [id number]  cc: Gladstone Lighter, MD, <Dictator> John B. Sarina Ser, MD Gladstone Lighter MD ELECTRONICALLY SIGNED 08/13/2012 12:59

## 2014-09-18 NOTE — Consult Note (Signed)
Urology Consultation Report for Consultation: Obstructing right UVJ calculus MD: Gladstone Lighter, M.D.MD: Darcella Cheshire, M.D. John B. Sarina Ser, M.D. 80 y.o. WF admitted 08/11/2012 with refractory right ureteral colic. Pt denies past h/o stones or FH of stones. Developed the acute onset of 10/10 steady, sharp pain in the RLQ following two large, formed BM's (no blood per rectum). Unrelieved with laying down and taking Bentyl. Fell to her knees due to the pain when attempting to get out of bed. EMS called and the pt was brought to [facility] ER. CT Abd/Pelvis with IV Contrast revealed an obstructing 72mm right UVJ stone with mild hydro and probable forniceal rupture. a 19mm LUP renal calculus was also detected. The pt required multiple doses of parenteral narcotics in the ER and so was admitted fo IV hydration and pain control. Pt developed recurrent 5/10 aching pain at 7pm which was relieved completely with parenteral narcotics. Pt denies any dysuria, gross hematuria, F/C. as per the H&P by Dr. Tressia Miners reviewed with the pt and confirmed with the following additional details: Cervical DJD/DDD with radiculopathy (burning left chest wall pain with muscular fasciculations x 56mos. Treated with ?Neurontin with resolution x 2 weeks now)h/o Skin Cancer 15 yrs ago (pt does not know which type)The prednisone is the last part of a taper for a flare of temporal arteritis.The Tramadol is for chronic (7-8 yrs) total body aching pain (?fibromyalgia vs Temporal Arteritis, per pt)h/o osteoporosis (?steroid-related, per pt)Pt takes the Xanax bid chronically due to anxiety tha results from taking the Tramadol.Takes Vit D, ? 5000 international unit po qDayTakes Vit B12 qDayNeosporin causes skin blistering.Former smoker (d/c'd x 10 yrs; 1/2 ppd x 10 yrs)FH of Colon Cancer (mother).FH of Scleroderma (father) T (97.53F), BP (151/64), P (93), RR (17), O2 sat (94% on 2L [place])WDWN WF in NADNC/AT, EOMI, anictericno masses or bruits: CTA, nL  resp effortRRR w/out M/G/R, 2+ radial pulses b/LNT/NT, no CVAT, NABS x4, no palpable masses/organomegalynL ROM, no edemanon-focalA&O x4, pleaseant, cooperativeno lesions about the head and neck; no cervicalsupraclavicular adenopathy BUN/Cr (14/1.12), Glucose (140), Ca++ (8.6), K+ (3.9); WBC (8k), UA neg (pH 9.0, SG 1.025)Studies (personally reviewed):37mm RIght UVJ Stone visible35mm LUP Stone; 64mm right distal ureteral stone, just proximal to the UVJ with mild to mod hydro  25mm Right Distal Ureteral Stone - no current indications for acute urologic intervention. Excellent pain control currently on parenteral narcotics.50mm LUP Renal Stone - asymptomatic  Trial of PO pain medication (oxycodone)Alpha blockade for medical expulsive therapy (tamsulosin 0.4mg  po qDay after the same meal)Strain all urine 4. May be d/c'd to home if with reasonable pain control on PO oxycodonef/u with Dr. Bernardo Heater you for the opportunity to participate in the care of this most pleasant woman.  Electronic Signatures: Darcella Cheshire (MD)  (Signed on 16-Mar-14 22:46)  Authored  Last Updated: 16-Mar-14 22:46 by Darcella Cheshire (MD)

## 2014-10-05 ENCOUNTER — Telehealth: Payer: Self-pay | Admitting: Internal Medicine

## 2014-10-05 NOTE — Telephone Encounter (Signed)
Left a message for patient to call back. 

## 2014-10-05 NOTE — Telephone Encounter (Signed)
Patient states for about a month she has had left side abdominal pain and constipation. She reports a hx of IBS. Recent UTI also. Scheduled with Dr. Olevia Perches per patient request(only wants MD) on 10/20/14 2:30 PM.

## 2014-10-19 ENCOUNTER — Encounter: Payer: Self-pay | Admitting: *Deleted

## 2014-10-20 ENCOUNTER — Ambulatory Visit (INDEPENDENT_AMBULATORY_CARE_PROVIDER_SITE_OTHER): Payer: Medicare Other | Admitting: Internal Medicine

## 2014-10-20 ENCOUNTER — Encounter: Payer: Self-pay | Admitting: Internal Medicine

## 2014-10-20 VITALS — BP 164/68 | HR 84 | Ht 64.0 in | Wt 163.8 lb

## 2014-10-20 DIAGNOSIS — K5901 Slow transit constipation: Secondary | ICD-10-CM

## 2014-10-20 DIAGNOSIS — R1012 Left upper quadrant pain: Secondary | ICD-10-CM | POA: Diagnosis not present

## 2014-10-20 MED ORDER — DICYCLOMINE HCL 20 MG PO TABS: 20.0000 mg | ORAL_TABLET | Freq: Three times a day (TID) | ORAL | Status: DC

## 2014-10-20 NOTE — Progress Notes (Signed)
.   [name] July 22, 1934 [id number]  Note: This dictation was prepared with Dragon digital system. Any transcriptional errors that result from this procedure are unintentional.   History of Present Illness: This is a 80 year old white female with irritable bowel syndrome now complaining of abdominal pain mostly on the  left side. She has known moderately severe diverticulosis documented  on last colonoscopy in 2014 when she also had a hyperplastic polyp removed. Prior colonoscopies in 2003 and 2008 confirmed diverticulosis. There is a family history of colon cancer in her mother. She has personal history of  breast cancer and a history of temporal arteritis. She has noticed small amount of leakage of the stool on intermittent basis. She also has occasional constipation. She has seen her for gastroesophageal reflux. Upper endoscopy in January 2010 showed small hiatal hernia and bile reflux. She had prior cholecystectomy complicated by biliary  Pancreatitis. She fell off the ladder last year  Injuring her back  And having ongoing pain.She has not been  able to exercise and move. She has become more constipated. She uses glycerin suppository for help evacuation of the stool    Past Medical History  Diagnosis Date  . Diverticulosis   . Tubular adenoma of colon   . GERD (gastroesophageal reflux disease)   . Hiatal hernia   . IBS (irritable bowel syndrome)   . Osteoporosis   . Temporal arteritis   . History of pancreatitis     Past Surgical History  Procedure Laterality Date  . Mastectomy  1983    bilateral  . Laparoscopic cholecystectomy  2009    Allergies  Allergen Reactions  . Escitalopram     Deveioped a twitch  . Neomycin-Bacitracin Zn-Polymyx     REACTION: blisters skin    Family history and social history have been reviewed.  Review of Systems: Left upper quadrant abdominal pain.  The remainder of the 10 point ROS is negative except as outlined in the  H&P  Physical Exam: General Appearance Well developed, in no distress Eyes  Non icteric  HEENT  Non traumatic, normocephalic  Mouth No lesion, tongue papillated, no cheilosis Neck Supple without adenopathy, thyroid not enlarged, no carotid bruits, no JVD Lungs Clear to auscultation bilaterally COR Normal S1, normal S2, regular rhythm, no murmur, quiet precordium Abdomen soft but very tender in left lower and left middle quadrant. No fullness no rebound. Upper abdomen unremarkable. Liver edge at costal margin. Postcholecystectomy scars Rectal decreased rectal sphincter tone. Small amount of  soft Hemoccult-negative stool Extremities  No pedal edema Skin No lesions Neurological Alert and oriented x 3 Psychological Normal mood and affect  Assessment and Plan:   80 year old, white female with  irritable bowel syndrome, now with  constipation aggravated by recent back injury.I encouraged her to start to exercise  to buildup the pelvic muscles. She will also begin Benefiber 1 heaping teaspoon daily and generic probiotics. We will refill dicyclomine 10 mg to take when necessary crampy abdominal pain. She may use Colace  when necessary constipation. I will see her in 3 months    Delfin Edis 10/20/2014

## 2014-10-20 NOTE — Patient Instructions (Addendum)
We will send in your prescription to your pharmacy Follow up in 3 months  Use Probiotic daily  Use Colace and Benfiber dailyDr Lisette Grinder

## 2014-11-03 ENCOUNTER — Encounter: Payer: Self-pay | Admitting: *Deleted

## 2014-11-04 ENCOUNTER — Encounter: Payer: Self-pay | Admitting: Urology

## 2014-11-04 ENCOUNTER — Ambulatory Visit (INDEPENDENT_AMBULATORY_CARE_PROVIDER_SITE_OTHER): Payer: Medicare Other | Admitting: Urology

## 2014-11-04 VITALS — BP 153/66 | HR 73 | Ht 65.0 in | Wt 164.5 lb

## 2014-11-04 DIAGNOSIS — IMO0001 Reserved for inherently not codable concepts without codable children: Secondary | ICD-10-CM

## 2014-11-04 DIAGNOSIS — N952 Postmenopausal atrophic vaginitis: Secondary | ICD-10-CM | POA: Diagnosis not present

## 2014-11-04 DIAGNOSIS — R399 Unspecified symptoms and signs involving the genitourinary system: Secondary | ICD-10-CM | POA: Diagnosis not present

## 2014-11-04 DIAGNOSIS — N8110 Cystocele, unspecified: Secondary | ICD-10-CM | POA: Diagnosis not present

## 2014-11-04 DIAGNOSIS — IMO0002 Reserved for concepts with insufficient information to code with codable children: Secondary | ICD-10-CM

## 2014-11-04 DIAGNOSIS — N200 Calculus of kidney: Secondary | ICD-10-CM | POA: Insufficient documentation

## 2014-11-04 LAB — URINALYSIS, COMPLETE
BILIRUBIN UA: NEGATIVE
Glucose, UA: NEGATIVE
Ketones, UA: NEGATIVE
Nitrite, UA: NEGATIVE
Protein, UA: NEGATIVE
Specific Gravity, UA: 1.02 (ref 1.005–1.030)
UUROB: 0.2 mg/dL (ref 0.2–1.0)
pH, UA: 5.5 (ref 5.0–7.5)

## 2014-11-04 LAB — MICROSCOPIC EXAMINATION: Bacteria, UA: NONE SEEN

## 2014-11-04 LAB — BLADDER SCAN AMB NON-IMAGING: Scan Result: 0

## 2014-11-04 MED ORDER — ESTROGENS, CONJUGATED 0.625 MG/GM VA CREA: 1.0000 | TOPICAL_CREAM | Freq: Every day | VAGINAL | Status: DC

## 2014-11-04 NOTE — Progress Notes (Signed)
11/04/2014 1:53 PM   [name] May 25, 1935 [id number]  Referring provider: Madelyn Brunner, MD [facility] [place], [place] [address]  Chief Complaint  Patient presents with  . Urinary Tract Infection    "urine is thick"    HPI: Mrs. Eddleman is a 80 year old white female who presents today at the request of her primary care office for "thick urine."  She has noticed this phenomenon since March 2016.  She finds it difficult to postpone urination, she has trouble starting her urinary stream and the stream is weak. As she describes it, "it feels like I'm peeing syrup."  She states she's been having a low-grade fever over the last several months and is wondering if it is a result of  her urinary symptoms. She has mild urge incontinence, she does not experience nocturia. She did spontaneously passed a kidney stone 2 years ago. She did not see a urologist for the stone.  She had 4-10RBCs/hpf on 08/31/2014 at her PCP's office. Today's UA, she has 0-2RBC's/hpf and 6-10WBC's/hpf.   She denies any gross hematuria. She does not endorse a h/o recurrent UTI's.  She is currently not experiencing chills, nausea, vomiting, suprapubic pain and/or dysuria.  Urine Culture, Routine - [facility]4/08/2014  [facility]  Component Name Value Range  Urine Culture, Routine - [facility] Final report   Result 1 - [facility] No growth    Specimen  Urine - Urine, Clean Catch   Result Narrative  Performed at:  [address], [place], [place]  [id number] Lab Director: Lindon Romp MD, Phone:  [phone]          PMH: Past Medical History  Diagnosis Date  . Diverticulosis   . Tubular adenoma of colon   . GERD (gastroesophageal reflux disease)   . Hiatal hernia   . IBS (irritable bowel syndrome)   . Osteoporosis   . Temporal arteritis   . History of pancreatitis   . History of colon polyps   . Depression   . History of  nephrolithiasis   . Hyperlipidemia   . Breast cancer   . History of Bell's palsy     Surgical History: Past Surgical History  Procedure Laterality Date  . Mastectomy  1983    bilateral  . Laparoscopic cholecystectomy  2009    Home Medications:    Medication List       This list is accurate as of: 11/04/14  1:53 PM.  Always use your most recent med list.               ALPRAZolam 0.25 MG tablet  Commonly known as:  XANAX  As needed     B-12 PO  Take by mouth daily.     Biotin 1000 MCG tablet  Take 1,000 mcg by mouth 2 (two) times daily.     dicyclomine 20 MG tablet  Commonly known as:  BENTYL  Take 1 tablet (20 mg total) by mouth 3 (three) times daily before meals.     MULTI-VITAMINS Tabs  Take by mouth.     predniSONE 5 MG tablet  Commonly known as:  DELTASONE  Take 5 mg by mouth daily.     traMADol 50 MG tablet  Commonly known as:  ULTRAM  One tablet by mouth twice daily     Vitamin D3 1000 UNITS Caps  Take by mouth 2 (two) times daily.        Allergies:  Allergies  Allergen Reactions  . Escitalopram     Deveioped a twitch  . Neomycin-Bacitracin Zn-Polymyx     REACTION: blisters skin    Family History: Family History  Problem Relation Age of Onset  . Colon cancer Mother 79  . Stomach cancer Neg Hx   . Scleroderma Father     Social History:  reports that she quit smoking about 9 years ago. She has never used smokeless tobacco. She reports that she does not drink alcohol or use illicit drugs.  ROS: Urological Symptom Review  Patient is experiencing the following symptoms: Hard to postpone urination Leakage of urine Trouble starting stream Urinary tract infection Weak stream   Review of Systems  Gastrointestinal (upper)  : Negative for upper GI symptoms  Gastrointestinal (lower) : Constipation  Constitutional : Fever Night Sweats Fatigue  Skin: Negative for skin symptoms  Eyes: Negative for eye  symptoms  Ear/Nose/Throat : Negative for Ear/Nose/Throat symptoms  Hematologic/Lymphatic: Negative for Hematologic/Lymphatic symptoms  Cardiovascular : Negative for cardiovascular symptoms  Respiratory : Negative for respiratory symptoms  Endocrine: Negative for endocrine symptoms  Musculoskeletal: Negative for musculoskeletal symptoms  Neurological: Negative for neurological symptoms  Psychologic: Negative for psychiatric symptoms   Physical Exam: BP 153/66 mmHg  Pulse 73  Ht 5\' 5"  (1.651 m)  Wt 164 lb 8 oz (74.617 kg)  BMI 27.37 kg/m2  Constitutional:  Alert and oriented, No acute distress. HEENT: [place] AT, moist mucus membranes.  Trachea midline, no masses. Cardiovascular: No clubbing, cyanosis, or edema. Respiratory: Normal respiratory effort, no increased work of breathing. GI: Abdomen is soft, nontender, nondistended, no abdominal masses GU: Atrophic lesions of external genitalia, urethral meatus with tiny carunclel, no discharge (Minimal urethral hypermobility without demonstrable SUI), urethra normal, bladder non-tender, no palpable masses (Fairly good vaginal vault support, no evidence of apical descent, Grade II cystocele and rectocele with Valsalva), normal vaginal walls with atrophic mucosa, no lesions, normal cervix, no motion tenderness or discharge and no adnexal tenderness or palpable masses. Skin: No rashes, bruises or suspicious lesions. Lymph: No cervical or inguinal adenopathy. Neurologic: Grossly intact, no focal deficits, moving all 4 extremities. Psychiatric: Normal mood and affect.  Laboratory Data: Lab Results  Component Value Date   WBC 5.3 08/12/2012   HGB 12.9 08/12/2012   HCT 38.5 08/12/2012   MCV 94 08/12/2012   PLT 237 08/12/2012    Lab Results  Component Value Date   CREATININE 0.78 08/12/2012    No results found for: PSA  No results found for: TESTOSTERONE  No results found for: HGBA1C  Urinalysis    Component Value  Date/Time   COLORURINE YELLOW 08/30/2007 2131   APPEARANCEUR CLOUDY* 08/30/2007 2131   LABSPEC 1.014 08/30/2007 2131   PHURINE 7.5 08/30/2007 2131   GLUCOSEU Negative 11/04/2014 0845   HGBUR NEGATIVE 08/30/2007 2131   BILIRUBINUR Negative 11/04/2014 0845   BILIRUBINUR NEGATIVE 08/30/2007 2131   KETONESUR 40* 08/30/2007 2131   PROTEINUR NEGATIVE 08/30/2007 2131   UROBILINOGEN 1.0 08/30/2007 2131   NITRITE Negative 11/04/2014 0845   NITRITE NEGATIVE 08/30/2007 2131   LEUKOCYTESUR 2+* 11/04/2014 0845   LEUKOCYTESUR  08/30/2007 2131    NEGATIVE MICROSCOPIC NOT DONE ON URINES WITH NEGATIVE PROTEIN, BLOOD, LEUKOCYTES, NITRITE, OR GLUCOSE <1000 mg/dL.    Pertinent Imaging: Results for ANALIAH, DRUM (MRN [id number]) as of 11/04/2014 09:00  Ref. Range 11/04/2014 08:55  Scan Result Unknown 0    Assessment & Plan:    1. UTI symptoms- Patient's description of feeling  like she's peeing syrup is most likely due to urinary hesitancy caused by her cystocele. I don't believe her urine has actual thickness to it, but rather urine is coming out at a slow velocity. - Urinalysis, Complete - CULTURE, URINE COMPREHENSIVE - Bladder Scan (Post Void Residual) in office  2. Atrophic vaginitis- Patient was given a sample of vaginal estrogen cream (Premarin) and instructed to apply 0.5mg  (pea-sized amount)  just inside the vaginal introitus with a finger-tip every night for two weeks and then Monday, Wednesday and Friday nights.  I explained to the patient that vaginally administered estrogen, which causes only a slight increase in the blood estrogen levels, have fewer contraindications and adverse systemic effects that oral HT.  I have also sent a prescription to her pharmacy. She will call the office if she finds the prescription cost prohibitive and we will call in the compounded estrogen to [place].  She will return in 3 months for symptom recheck and exam.  3. Cystocele- Patient was found to have a  grade 2 cystocele on exam. She had 0 cc PVR. The stress urinary incontinence. We discussed physical therapy to try to strengthen her pelvic muscles and/or referral to gynecology for a pessary fitting. But since she is not having any leakage or high residuals she would like to defer those options at this time.  4. Hypertension- Patient to follow-up with PCP.  Return in about 3 months (around 02/04/2015) for exam.  Zara Council, [facility]  [facility] [address] [place], [place] [address] [phone]

## 2014-11-06 LAB — CULTURE, URINE COMPREHENSIVE

## 2014-11-09 ENCOUNTER — Telehealth: Payer: Self-pay

## 2014-11-09 NOTE — Telephone Encounter (Signed)
Spoke with pt and made aware of negative urine cx. Pt voiced understanding. Cw,lpn

## 2014-11-09 NOTE — Telephone Encounter (Signed)
-----   Message from Nori Riis, PA-C sent at 11/06/2014  5:00 PM EDT ----- UCx is negative.  I will see her in 3 months.

## 2015-02-04 ENCOUNTER — Ambulatory Visit: Payer: Self-pay | Admitting: Urology

## 2015-08-03 ENCOUNTER — Encounter: Payer: Self-pay | Admitting: Internal Medicine

## 2016-05-02 ENCOUNTER — Encounter (INDEPENDENT_AMBULATORY_CARE_PROVIDER_SITE_OTHER): Payer: Self-pay

## 2016-05-02 ENCOUNTER — Ambulatory Visit (INDEPENDENT_AMBULATORY_CARE_PROVIDER_SITE_OTHER): Payer: Medicare Other | Admitting: Vascular Surgery

## 2016-05-02 ENCOUNTER — Encounter (INDEPENDENT_AMBULATORY_CARE_PROVIDER_SITE_OTHER): Payer: Self-pay | Admitting: Vascular Surgery

## 2016-05-02 VITALS — BP 167/68 | HR 73 | Resp 16 | Ht 65.0 in | Wt 170.4 lb

## 2016-05-02 DIAGNOSIS — M316 Other giant cell arteritis: Secondary | ICD-10-CM

## 2016-05-02 DIAGNOSIS — M79605 Pain in left leg: Secondary | ICD-10-CM

## 2016-05-02 DIAGNOSIS — M79609 Pain in unspecified limb: Secondary | ICD-10-CM | POA: Insufficient documentation

## 2016-05-02 DIAGNOSIS — M79604 Pain in right leg: Secondary | ICD-10-CM

## 2016-05-02 DIAGNOSIS — E785 Hyperlipidemia, unspecified: Secondary | ICD-10-CM

## 2016-05-02 NOTE — Patient Instructions (Signed)
Peripheral Vascular Disease Peripheral vascular disease (PVD) is a disease of the blood vessels that are not part of your heart and brain. A simple term for PVD is poor circulation. In most cases, PVD narrows the blood vessels that carry blood from your heart to the rest of your body. This can result in a decreased supply of blood to your arms, legs, and internal organs, like your stomach or kidneys. However, it most often affects a person's lower legs and feet. There are two types of PVD.  Organic PVD. This is the more common type. It is caused by damage to the structure of blood vessels.  Functional PVD. This is caused by conditions that make blood vessels contract and tighten (spasm).  Without treatment, PVD tends to get worse over time. PVD can also lead to acute ischemic limb. This is when an arm or limb suddenly has trouble getting enough blood. This is a medical emergency. What are the causes? Each type of PVD has many different causes. The most common cause of PVD is buildup of a fatty material (plaque) inside of your arteries (atherosclerosis). Small amounts of plaque can break off from the walls of the blood vessels and become lodged in a smaller artery. This blocks blood flow and can cause acute ischemic limb. Other common causes of PVD include:  Blood clots that form inside of blood vessels.  Injuries to blood vessels.  Diseases that cause inflammation of blood vessels or cause blood vessel spasms.  Health behaviors and health history that increase your risk of developing PVD.  What increases the risk? You may have a greater risk of PVD if you:  Have a family history of PVD.  Have certain medical conditions, including: ? High cholesterol. ? Diabetes. ? High blood pressure (hypertension). ? Coronary heart disease. ? Past problems with blood clots. ? Past injury, such as burns or a broken bone. These may have damaged blood vessels in your limbs. ? Buerger disease. This is  caused by inflamed blood vessels in your hands and feet. ? Some forms of arthritis. ? Rare birth defects that affect the arteries in your legs.  Use tobacco.  Do not get enough exercise.  Are obese.  Are age 50 or older.  What are the signs or symptoms? PVD may cause many different symptoms. Your symptoms depend on what part of your body is not getting enough blood. Some common signs and symptoms include:  Cramps in your lower legs. This may be a symptom of poor leg circulation (claudication).  Pain and weakness in your legs while you are physically active that goes away when you rest (intermittent claudication).  Leg pain when at rest.  Leg numbness, tingling, or weakness.  Coldness in a leg or foot, especially when compared with the other leg.  Skin or hair changes. These can include: ? Hair loss. ? Shiny skin. ? Pale or bluish skin. ? Thick toenails.  Inability to get or maintain an erection (erectile dysfunction).  People with PVD are more prone to developing ulcers and sores on their toes, feet, or legs. These may take longer than normal to heal. How is this diagnosed? Your health care provider may diagnose PVD from your signs and symptoms. The health care provider will also do a physical exam. You may have tests to find out what is causing your PVD and determine its severity. Tests may include:  Blood pressure recordings from your arms and legs and measurements of the strength of your pulses (  pulse volume recordings).  Imaging studies using sound waves to take pictures of the blood flow through your blood vessels (Doppler ultrasound).  Injecting a dye into your blood vessels before having imaging studies using: ? X-rays (angiogram or arteriogram). ? Computer-generated X-rays (CT angiogram). ? A powerful electromagnetic field and a computer (magnetic resonance angiogram or MRA).  How is this treated? Treatment for PVD depends on the cause of your condition and the  severity of your symptoms. It also depends on your age. Underlying causes need to be treated and controlled. These include long-lasting (chronic) conditions, such as diabetes, high cholesterol, and high blood pressure. You may need to first try making lifestyle changes and taking medicines. Surgery may be needed if these do not work. Lifestyle changes may include:  Quitting smoking.  Exercising regularly.  Following a low-fat, low-cholesterol diet.  Medicines may include:  Blood thinners to prevent blood clots.  Medicines to improve blood flow.  Medicines to improve your blood cholesterol levels.  Surgical procedures may include:  A procedure that uses an inflated balloon to open a blocked artery and improve blood flow (angioplasty).  A procedure to put in a tube (stent) to keep a blocked artery open (stent implant).  Surgery to reroute blood flow around a blocked artery (peripheral bypass surgery).  Surgery to remove dead tissue from an infected wound on the affected limb.  Amputation. This is surgical removal of the affected limb. This may be necessary in cases of acute ischemic limb that are not improved through medical or surgical treatments.  Follow these instructions at home:  Take medicines only as directed by your health care provider.  Do not use any tobacco products, including cigarettes, chewing tobacco, or electronic cigarettes. If you need help quitting, ask your health care provider.  Lose weight if you are overweight, and maintain a healthy weight as directed by your health care provider.  Eat a diet that is low in fat and cholesterol. If you need help, ask your health care provider.  Exercise regularly. Ask your health care provider to suggest some good activities for you.  Use compression stockings or other mechanical devices as directed by your health care provider.  Take good care of your feet. ? Wear comfortable shoes that fit well. ? Check your feet  often for any cuts or sores. Contact a health care provider if:  You have cramps in your legs while walking.  You have leg pain when you are at rest.  You have coldness in a leg or foot.  Your skin changes.  You have erectile dysfunction.  You have cuts or sores on your feet that are not healing. Get help right away if:  Your arm or leg turns cold and blue.  Your arms or legs become red, warm, swollen, painful, or numb.  You have chest pain or trouble breathing.  You suddenly have weakness in your face, arm, or leg.  You become very confused or lose the ability to speak.  You suddenly have a very bad headache or lose your vision. This information is not intended to replace advice given to you by your health care provider. Make sure you discuss any questions you have with your health care provider. Document Released: 06/22/2004 Document Revised: 10/21/2015 Document Reviewed: 10/23/2013 Elsevier Interactive Patient Education  2017 [employer]  

## 2016-05-02 NOTE — Assessment & Plan Note (Signed)
lipid control important in reducing the progression of atherosclerotic disease.   

## 2016-05-02 NOTE — Progress Notes (Signed)
Patient ID: [name], female   DOB: [birth year], 81 y.o.   MRN: [id number]  Chief Complaint  Patient presents with  . New Patient (Initial Visit)    HPI [name] is a 81 y.o. female.  I am asked to see the patient by Dr. Jefm Bryant for evaluation of leg pain and possible PAD.  The patient reports cramping and weakness in her lower leg in the calf and ankle area with short distances of walking. It is more prominent in the left lower extremity, but she also notices some similar symptoms to a less severe extent on the right. This has been steadily worsening over the past several months. She did have an episode where she fell off of the lateral landing on her back, and this did seem to markedly worsen her symptoms. She has a previous history of temporal arteritis and is on steroids. She has not had any bypass surgery or stenting for vascular disease to her knowledge. She does not have ulceration or infection. She does not describe ischemic rest pain symptoms. The pain comes on after only about 50-70 feet of walking. When she stops and rests, the pain goes away.   Past Medical History:  Diagnosis Date  . Breast cancer ([place])   . Depression   . Diverticulosis   . GERD (gastroesophageal reflux disease)   . Hiatal hernia   . History of Bell's palsy   . History of colon polyps   . History of nephrolithiasis   . History of pancreatitis   . Hyperlipidemia   . IBS (irritable bowel syndrome)   . Osteoporosis   . Temporal arteritis ([place])   . Tubular adenoma of colon     Past Surgical History:  Procedure Laterality Date  . LAPAROSCOPIC CHOLECYSTECTOMY  2009  . MASTECTOMY  1983   bilateral    Family History  Problem Relation Age of Onset  . Colon cancer Mother 4  . Scleroderma Father   . Stomach cancer Neg Hx   No bleeding or clotting disorders  Social History Social History  Substance Use Topics  . Smoking status: Former Smoker    Quit date: 05/17/2005  .  Smokeless tobacco: Never Used     Comment: quit 11 years ago  . Alcohol use No  No IV drug use  Allergies  Allergen Reactions  . Escitalopram     Deveioped a twitch  . Neomycin-Bacitracin Zn-Polymyx     REACTION: blisters skin    Current Outpatient Prescriptions  Medication Sig Dispense Refill  . ALPRAZolam (XANAX) 0.25 MG tablet As needed     . Biotin 1000 MCG tablet Take 1,000 mcg by mouth 2 (two) times daily.      . Cholecalciferol (VITAMIN D3) 1000 UNITS CAPS Take by mouth 2 (two) times daily.      . Cyanocobalamin (B-12 PO) Take by mouth daily.    . Multiple Vitamin (MULTI-VITAMINS) TABS Take by mouth.    . predniSONE (DELTASONE) 5 MG tablet Take 5 mg by mouth daily.     . traMADol (ULTRAM) 50 MG tablet One tablet by mouth twice daily     . conjugated estrogens (PREMARIN) vaginal cream Place 1 Applicatorful vaginally daily. Patient was given a sample of vaginal estrogen cream and instructed to apply 0.5mg  (pea-sized amount)  just inside the vaginal introitus with a finger-tip every night for two weeks and then Monday, Wednesday and Friday nights. (Patient not taking: Reported on 05/02/2016) 42.5 g 12  .  dicyclomine (BENTYL) 20 MG tablet Take 1 tablet (20 mg total) by mouth 3 (three) times daily before meals. (Patient not taking: Reported on 05/02/2016) 90 tablet 3   No current facility-administered medications for this visit.       REVIEW OF SYSTEMS (Negative unless checked)  Constitutional: [] Weight loss  [] Fever  [] Chills Cardiac: [] Chest pain   [] Chest pressure   [] Palpitations   [] Shortness of breath when laying flat   [] Shortness of breath at rest   [] Shortness of breath with exertion. Vascular:  [x] Pain in legs with walking   [] Pain in legs at rest   [] Pain in legs when laying flat   [x] Claudication   [] Pain in feet when walking  [] Pain in feet at rest  [] Pain in feet when laying flat   [] History of DVT   [] Phlebitis   [] Swelling in legs   [x] Varicose veins   [] Non-healing  ulcers Pulmonary:   [] Uses home oxygen   [] Productive cough   [] Hemoptysis   [] Wheeze  [] COPD   [] Asthma Neurologic:  [] Dizziness  [] Blackouts   [] Seizures   [] History of stroke   [] History of TIA  [] Aphasia   [] Temporary blindness   [] Dysphagia   [] Weakness or numbness in arms   [] Weakness or numbness in legs Musculoskeletal:  [] Arthritis   [] Joint swelling   [] Joint pain   [x] Low back pain Hematologic:  [] Easy bruising  [] Easy bleeding   [] Hypercoagulable state   [] Anemic  [] Hepatitis Gastrointestinal:  [] Blood in stool   [] Vomiting blood  [] Gastroesophageal reflux/heartburn   [] Abdominal pain Genitourinary:  [] Chronic kidney disease   [] Difficult urination  [] Frequent urination  [] Burning with urination   [] Hematuria Skin:  [] Rashes   [] Ulcers   [] Wounds Psychological:  [] History of anxiety   []  History of major depression.    Physical Exam BP (!) 167/68   Pulse 73   Resp 16   Ht 5\' 5"  (1.651 m)   Wt 170 lb 6.4 oz (77.3 kg)   BMI 28.36 kg/m  Gen:  WD/WN, NAD. Appears younger than stated age Head: [place]/AT, No temporalis wasting. Prominent temp pulse not noted. Ear/Nose/Throat: Hearing grossly intact, nares w/o erythema or drainage, oropharynx w/o Erythema/Exudate Eyes: Conjunctiva clear, sclera non-icteric  Neck: trachea midline.  No JVD.  Pulmonary:  Good air movement, no use of accessory muscles, respirations nonlabored Cardiac: RRR, normal S1, S2 Vascular:  Vessel Right Left  Radial Palpable Palpable  Ulnar Palpable Palpable  Brachial Palpable Palpable  Carotid Palpable, without bruit Palpable, without bruit  Aorta Not palpable N/A  Femoral Palpable Palpable  Popliteal Palpable Palpable  PT 1+ Palpable 1+ Palpable  DP Palpable 1+ Palpable   Gastrointestinal: soft, non-tender/non-distended. No guarding/reflex. No masses, surgical incisions, or scars. Musculoskeletal: M/S 5/5 throughout.  Extremities without ischemic changes.  No deformity or atrophy. 1+ bilateral lower  extremity edema. Stasis changes present worse on the left leg on the right leg. Diffuse varicosities bilaterally a little worse on the right than the left. Neurologic: Sensation grossly intact in extremities.  Symmetrical.  Speech is fluent. Motor exam as listed above. Psychiatric: Judgment intact, Mood & affect appropriate for pt's clinical situation. Dermatologic: No rashes or ulcers noted.  No cellulitis or open wounds. Lymph : No Cervical, Axillary, or Inguinal lymphadenopathy.   Radiology No results found.  Labs No results found for this or any previous visit (from the past 2160 hour(s)).  Assessment/Plan:  TEMPORAL ARTERITIS Takes prednisone daily.  HLD (hyperlipidemia) lipid control important in reducing the progression of  atherosclerotic disease.   Pain in limb We had a long discussion today regarding the pathophysiology and natural history of peripheral arterial disease. She has lower extremity symptoms that are not entirely clear in their etiology. The characteristic as far pain certainly sound as if they could be coming from peripheral arterial disease, but neurogenic claudication is also a possibility particularly given her previous back injury. I have discussed the differences between neurogenic and arterial claudication. I have recommended an evaluation of her arterial system as that is much easier to assess for the neurogenic claudication. Given her age and previous tobacco use that is certainly a possibility. I will see her back with her noninvasive studies in the near future to discuss the results and determine further treatment options.      Leotis Pain 05/02/2016, 9:55 AM   This note was created with Dragon medical transcription system.  Any errors from dictation are unintentional.

## 2016-05-02 NOTE — Assessment & Plan Note (Signed)
We had a long discussion today regarding the pathophysiology and natural history of peripheral arterial disease. She has lower extremity symptoms that are not entirely clear in their etiology. The characteristic as far pain certainly sound as if they could be coming from peripheral arterial disease, but neurogenic claudication is also a possibility particularly given her previous back injury. I have discussed the differences between neurogenic and arterial claudication. I have recommended an evaluation of her arterial system as that is much easier to assess for the neurogenic claudication. Given her age and previous tobacco use that is certainly a possibility. I will see her back with her noninvasive studies in the near future to discuss the results and determine further treatment options.

## 2016-05-02 NOTE — Assessment & Plan Note (Signed)
Takes prednisone daily.

## 2016-05-10 ENCOUNTER — Encounter (INDEPENDENT_AMBULATORY_CARE_PROVIDER_SITE_OTHER): Payer: Self-pay | Admitting: Vascular Surgery

## 2016-05-10 ENCOUNTER — Ambulatory Visit (INDEPENDENT_AMBULATORY_CARE_PROVIDER_SITE_OTHER): Payer: Medicare Other

## 2016-05-10 ENCOUNTER — Ambulatory Visit (INDEPENDENT_AMBULATORY_CARE_PROVIDER_SITE_OTHER): Payer: Medicare Other | Admitting: Vascular Surgery

## 2016-05-10 VITALS — BP 148/65 | HR 97 | Resp 16

## 2016-05-10 DIAGNOSIS — M79605 Pain in left leg: Secondary | ICD-10-CM

## 2016-05-10 DIAGNOSIS — M79604 Pain in right leg: Secondary | ICD-10-CM

## 2016-05-10 DIAGNOSIS — E785 Hyperlipidemia, unspecified: Secondary | ICD-10-CM

## 2016-05-10 DIAGNOSIS — I739 Peripheral vascular disease, unspecified: Secondary | ICD-10-CM | POA: Insufficient documentation

## 2016-05-10 NOTE — Progress Notes (Signed)
Subjective:    Patient ID: [name], female    DOB: [birth year], 81 y.o.   MRN: [id number] Chief Complaint  Patient presents with  . Follow-up   Patient presents to review vascular studies. She was last seen on 05/02/16 for evaluation of leg pain and possible PAD. The patient reports cramping and weakness in her left lower leg in the calf and ankle area with short distances of walking. It is more prominent in the left lower extremity, but she also notices some similar symptoms to a less severe extent on the right. This has been steadily worsening over the past several months. She did have an episode where she fell off of the lateral landing on her back, and this did seem to markedly worsen her symptoms. She has a previous history of temporal arteritis and is on steroids. She has not had any bypass surgery or stenting for vascular disease to her knowledge. She does not have ulceration or infection. She does not describe ischemic rest pain symptoms. The pain comes on after only about 50-70 feet of walking. When she stops and rests, the pain goes away. The patient underwent an ABI which showed Right ABI: 1.14 and Left 0.54 (no previous for comparison).   Review of Systems  Constitutional: Negative.   HENT: Negative.   Eyes: Negative.   Respiratory: Negative.   Cardiovascular:       Left Lower Extremity Claudication  Gastrointestinal: Negative.   Endocrine: Negative.   Genitourinary: Negative.   Musculoskeletal: Negative.   Skin: Negative.   Allergic/Immunologic: Negative.   Neurological: Negative.   Hematological: Negative.   Psychiatric/Behavioral: Negative.       Objective:   Physical Exam Gen:  WD/WN, NAD. Appears younger than stated age Head: [place]/AT, No temporalis wasting. Prominent temp pulse not noted. Ear/Nose/Throat: Hearing grossly intact, nares w/o erythema or drainage, oropharynx w/o Erythema/Exudate Eyes: Conjunctiva clear, sclera non-icteric  Neck: trachea  midline.  No JVD.  Pulmonary:  Good air movement, no use of accessory muscles, respirations nonlabored Cardiac: RRR, normal S1, S2 Vascular:  Vessel Right Left  Radial Palpable Palpable  Ulnar Palpable Palpable  Brachial Palpable Palpable  Carotid Palpable, without bruit Palpable, without bruit  Aorta Not palpable N/A  Femoral Palpable Palpable  Popliteal Palpable Palpable  PT 1+ Palpable 1+ Palpable  DP Palpable 1+ Palpable   Gastrointestinal: soft, non-tender/non-distended. No guarding/reflex. No masses, surgical incisions, or scars. Musculoskeletal: M/S 5/5 throughout.  Extremities without ischemic changes.  No deformity or atrophy. 1+ bilateral lower extremity edema. Stasis changes present worse on the left leg on the right leg. Diffuse varicosities bilaterally a little worse on the right than the left. Neurologic: Sensation grossly intact in extremities.  Symmetrical.  Speech is fluent. Motor exam as listed above. Psychiatric: Judgment intact, Mood & affect appropriate for pt's clinical situation. Dermatologic: No rashes or ulcers noted.  No cellulitis or open wounds. Lymph : No Cervical, Axillary, or Inguinal lymphadenopathy  BP (!) 148/65   Pulse 97   Resp 16   Past Medical History:  Diagnosis Date  . Breast cancer ([place])   . Depression   . Diverticulosis   . GERD (gastroesophageal reflux disease)   . Hiatal hernia   . History of Bell's palsy   . History of colon polyps   . History of nephrolithiasis   . History of pancreatitis   . Hyperlipidemia   . IBS (irritable bowel syndrome)   . Osteoporosis   . Temporal arteritis ([place])   .  Tubular adenoma of colon    Social History   Social History  . Marital status: Widowed    Spouse name: N/A  . Number of children: 2  . Years of education: college   Occupational History  . engineering   . Retired    Social History Main Topics  . Smoking status: Former Smoker    Quit date: 05/17/2005  . Smokeless tobacco: Never  Used     Comment: quit 11 years ago  . Alcohol use No  . Drug use: No  . Sexual activity: Not on file   Other Topics Concern  . Not on file   Social History Narrative   Patient lives at home alone.    Patient is retired.    Patient has some college.    Patient has 2 children.   Past Surgical History:  Procedure Laterality Date  . LAPAROSCOPIC CHOLECYSTECTOMY  2009  . MASTECTOMY  1983   bilateral   Family History  Problem Relation Age of Onset  . Colon cancer Mother 76  . Scleroderma Father   . Stomach cancer Neg Hx    Allergies  Allergen Reactions  . Escitalopram     Deveioped a twitch  . Neomycin-Bacitracin Zn-Polymyx     REACTION: blisters skin      Assessment & Plan:  Patient presents to review vascular studies. She was last seen on 05/02/16 for evaluation of leg pain and possible PAD. The patient reports cramping and weakness in her left lower leg in the calf and ankle area with short distances of walking. It is more prominent in the left lower extremity, but she also notices some similar symptoms to a less severe extent on the right. This has been steadily worsening over the past several months. She did have an episode where she fell off of the lateral landing on her back, and this did seem to markedly worsen her symptoms. She has a previous history of temporal arteritis and is on steroids. She has not had any bypass surgery or stenting for vascular disease to her knowledge. She does not have ulceration or infection. She does not describe ischemic rest pain symptoms. The pain comes on after only about 50-70 feet of walking. When she stops and rests, the pain goes away. The patient underwent an ABI which showed Right ABI: 1.14 and Left 0.54 (no previous for comparison).  1. PAD (peripheral artery disease) ([place]) - New Patient symptomatic with severe arterial disease on ABI to LLE. Recommend LLE angiogram in an effort to revascularize the extremity. Patient is very hesitant  and nervous to move forward to angiogram. Will order LLE arterial duplex. Based on those results will revisit need for angiogram.  I have discussed with the patient at length the risk factors for and pathogenesis of atherosclerotic disease and encouraged a healthy diet, regular exercise regimen and blood pressure / glucose control.  The patient was encouraged to call the office in the interim if she experiences any claudication like symptoms, rest pain or ulcers to her feet / toes.  - VAS [place] LOWER EXTREMITY ARTERIAL DUPLEX; Future  2. Hyperlipidemia, unspecified hyperlipidemia type - Stable Encouraged good control as its slows the progression of atherosclerotic disease  3. Pain in both lower extremities - Stable Patient scheduled for LLE arterial duplex to further assess PAD.   Current Outpatient Prescriptions on File Prior to Visit  Medication Sig Dispense Refill  . ALPRAZolam (XANAX) 0.25 MG tablet As needed     . Biotin  1000 MCG tablet Take 1,000 mcg by mouth 2 (two) times daily.      . Cholecalciferol (VITAMIN D3) 1000 UNITS CAPS Take by mouth 2 (two) times daily.      Marland Kitchen conjugated estrogens (PREMARIN) vaginal cream Place 1 Applicatorful vaginally daily. Patient was given a sample of vaginal estrogen cream and instructed to apply 0.5mg  (pea-sized amount)  just inside the vaginal introitus with a finger-tip every night for two weeks and then Monday, Wednesday and Friday nights. 42.5 g 12  . Cyanocobalamin (B-12 PO) Take by mouth daily.    Marland Kitchen dicyclomine (BENTYL) 20 MG tablet Take 1 tablet (20 mg total) by mouth 3 (three) times daily before meals. 90 tablet 3  . Multiple Vitamin (MULTI-VITAMINS) TABS Take by mouth.    . predniSONE (DELTASONE) 5 MG tablet Take 5 mg by mouth daily.     . traMADol (ULTRAM) 50 MG tablet One tablet by mouth twice daily      No current facility-administered medications on file prior to visit.    There are no Patient Instructions on file for this visit. Return  for LLE Arterial Duplex.  Kamika Goodloe A Nikia Mangino, PA-C

## 2016-07-24 ENCOUNTER — Ambulatory Visit (INDEPENDENT_AMBULATORY_CARE_PROVIDER_SITE_OTHER): Payer: Medicare Other | Admitting: Vascular Surgery

## 2016-07-24 ENCOUNTER — Ambulatory Visit (INDEPENDENT_AMBULATORY_CARE_PROVIDER_SITE_OTHER): Payer: Medicare Other

## 2016-07-24 DIAGNOSIS — I739 Peripheral vascular disease, unspecified: Secondary | ICD-10-CM | POA: Diagnosis not present

## 2016-08-01 ENCOUNTER — Encounter (INDEPENDENT_AMBULATORY_CARE_PROVIDER_SITE_OTHER): Payer: Self-pay | Admitting: Vascular Surgery

## 2016-08-01 ENCOUNTER — Ambulatory Visit (INDEPENDENT_AMBULATORY_CARE_PROVIDER_SITE_OTHER): Payer: Medicare Other | Admitting: Vascular Surgery

## 2016-08-01 VITALS — BP 150/63 | HR 99 | Resp 16 | Ht 66.0 in | Wt 173.0 lb

## 2016-08-01 DIAGNOSIS — I70212 Atherosclerosis of native arteries of extremities with intermittent claudication, left leg: Secondary | ICD-10-CM | POA: Diagnosis not present

## 2016-08-01 DIAGNOSIS — E785 Hyperlipidemia, unspecified: Secondary | ICD-10-CM

## 2016-08-01 DIAGNOSIS — I70219 Atherosclerosis of native arteries of extremities with intermittent claudication, unspecified extremity: Secondary | ICD-10-CM | POA: Insufficient documentation

## 2016-08-01 NOTE — Progress Notes (Signed)
MRN : [id number]  [name] is a 82 y.o. (08-13-1934) female who presents with chief complaint of  Chief Complaint  Patient presents with  . Re-evaluation    Follow up to discuss left leg angio  .  History of Present Illness: Patient returns today in follow up of PAD. She does have claudication symptoms and a moderate distance of walking currently. She had an area on her left leg which is now healed. She has known moderate severely reduced ABI on the left of 0.54 with a normal ABI on the right of 1. Her symptoms are stable and not worsened from her last visit 3 months ago.   Current Outpatient Prescriptions on File Prior to Visit  Medication Sig Dispense Refill  . ALPRAZolam (XANAX) 0.25 MG tablet As needed     . Biotin 1000 MCG tablet Take 1,000 mcg by mouth 2 (two) times daily.      . Cholecalciferol (VITAMIN D3) 1000 UNITS CAPS Take by mouth 2 (two) times daily.      Marland Kitchen conjugated estrogens (PREMARIN) vaginal cream Place 1 Applicatorful vaginally daily. Patient was given a sample of vaginal estrogen cream and instructed to apply 0.5mg  (pea-sized amount)  just inside the vaginal introitus with a finger-tip every night for two weeks and then Monday, Wednesday and Friday nights. 42.5 g 12  . Cyanocobalamin (B-12 PO) Take by mouth daily.    Marland Kitchen dicyclomine (BENTYL) 20 MG tablet Take 1 tablet (20 mg total) by mouth 3 (three) times daily before meals. 90 tablet 3  . Multiple Vitamin (MULTI-VITAMINS) TABS Take by mouth.    . predniSONE (DELTASONE) 5 MG tablet Take 5 mg by mouth daily.     . traMADol (ULTRAM) 50 MG tablet One tablet by mouth twice daily        Review of Systems  Constitutional: Negative.   HENT: Negative.   Eyes: Negative.   Respiratory: Negative.   Cardiovascular:       Left Lower Extremity Claudication  Gastrointestinal: Negative.   Endocrine: Negative.   Genitourinary: Negative.   Musculoskeletal: Negative.   Skin: Negative.     Allergic/Immunologic: Negative.   Neurological: Negative.   Hematological: Negative.   Psychiatric/Behavioral: Negative.       Objective:   Physical Exam Gen: WD/WN, NAD. Appears younger than stated age Head: [place]/AT, No temporalis wasting. Prominent temp pulse not noted. Ear/Nose/Throat: Hearing grossly intact, nares w/o erythema or drainage, oropharynx w/o Erythema/Exudate Eyes: Conjunctiva clear, sclera non-icteric  Neck: trachea midline. No JVD.  Pulmonary: Good air movement, no use of accessory muscles, respirations nonlabored Cardiac: RRR, normal S1, S2 Vascular:  Vessel Right Left  Radial Palpable Palpable  Ulnar Palpable Palpable  Brachial Palpable Palpable  Carotid Palpable, without bruit Palpable, without bruit  Aorta Not palpable N/A  Femoral Palpable Palpable  Popliteal Palpable Palpable  PT 1+ Palpable 1+ Palpable  DP Palpable 1+ Palpable   Gastrointestinal: soft, non-tender/non-distended. No guarding/reflex. No masses, surgical incisions, or scars. Musculoskeletal: M/S 5/5 throughout. Extremities without ischemic changes. No deformity or atrophy. 1+ bilateral lower extremityedema.Stasis changes present worse on the left leg on the right leg. Diffuse varicosities bilaterally a little worse on the right than the left. Neurologic: Sensation grossly intact in extremities. Symmetrical. Speech is fluent. Motor exam as listed above. Psychiatric: Judgment intact, Mood & affect appropriate for pt's clinical situation. Dermatologic: No rashes or ulcers noted. No cellulitis or open wounds. Lymph : No Cervical, Axillary, or Inguinal lymphadenopathy  BP Marland Kitchen)  148/65   Pulse 97   Resp 16       Past Medical History:  Diagnosis Date  . Breast cancer ([place])   . Depression   . Diverticulosis   . GERD (gastroesophageal reflux disease)   . Hiatal hernia   . History of Bell's palsy   . History of colon polyps   . History of nephrolithiasis   . History of  pancreatitis   . Hyperlipidemia   . IBS (irritable bowel syndrome)   . Osteoporosis   . Temporal arteritis ([place])   . Tubular adenoma of colon    Social History        Social History  . Marital status: Widowed    Spouse name: N/A  . Number of children: 2  . Years of education: college   Occupational History  . engineering   . Retired          Social History Main Topics  . Smoking status: Former Smoker    Quit date: 05/17/2005  . Smokeless tobacco: Never Used     Comment: quit 11 years ago  . Alcohol use No  . Drug use: No  . Sexual activity: Not on file       Other Topics Concern  . Not on file      Social History Narrative   Patient lives at home alone.    Patient is retired.    Patient has some college.    Patient has 2 children.        Past Surgical History:  Procedure Laterality Date  . LAPAROSCOPIC CHOLECYSTECTOMY  2009  . MASTECTOMY  1983   bilateral        Family History  Problem Relation Age of Onset  . Colon cancer Mother 60  . Scleroderma Father   . Stomach cancer Neg Hx         Allergies  Allergen Reactions  . Escitalopram     Deveioped a twitch  . Neomycin-Bacitracin Zn-Polymyx     REACTION: blisters skin      Labs No results found for this or any previous visit (from the past 2160 hour(s)).  Radiology No results found.    Assessment/Plan  HLD (hyperlipidemia) lipid control important in reducing the progression of atherosclerotic disease.    Atherosclerosis of native arteries of extremity with intermittent claudication ([place]) The patient has reasonably stable, moderate claudication symptoms. We had a long discussion today about options. He does not currently have any limb threatening symptoms. She would like to continue conservative therapy and not have intervention at this point. That is certainly reasonable. I have recommended a scheduled exercise regimen to try to improve her walking  distances. She should take an aspirin daily. We will recheck noninvasive studies in 3 months.    Leotis Pain, MD  08/01/2016 1:24 PM    This note was created with Dragon medical transcription system.  Any errors from dictation are purely unintentional

## 2016-08-01 NOTE — Assessment & Plan Note (Signed)
lipid control important in reducing the progression of atherosclerotic disease.   

## 2016-08-01 NOTE — Assessment & Plan Note (Signed)
The patient has reasonably stable, moderate claudication symptoms. We had a long discussion today about options. He does not currently have any limb threatening symptoms. She would like to continue conservative therapy and not have intervention at this point. That is certainly reasonable. I have recommended a scheduled exercise regimen to try to improve her walking distances. She should take an aspirin daily. We will recheck noninvasive studies in 3 months.

## 2016-11-03 ENCOUNTER — Encounter (INDEPENDENT_AMBULATORY_CARE_PROVIDER_SITE_OTHER): Payer: Medicare Other

## 2016-11-03 ENCOUNTER — Ambulatory Visit (INDEPENDENT_AMBULATORY_CARE_PROVIDER_SITE_OTHER): Payer: Medicare Other | Admitting: Vascular Surgery

## 2016-11-07 ENCOUNTER — Other Ambulatory Visit (INDEPENDENT_AMBULATORY_CARE_PROVIDER_SITE_OTHER): Payer: Medicare Other

## 2016-11-07 ENCOUNTER — Ambulatory Visit (INDEPENDENT_AMBULATORY_CARE_PROVIDER_SITE_OTHER): Payer: Medicare Other | Admitting: Vascular Surgery

## 2016-11-07 ENCOUNTER — Encounter (INDEPENDENT_AMBULATORY_CARE_PROVIDER_SITE_OTHER): Payer: Self-pay | Admitting: Vascular Surgery

## 2016-11-07 VITALS — BP 189/79 | HR 78 | Resp 15 | Ht 65.0 in | Wt 176.0 lb

## 2016-11-07 DIAGNOSIS — I739 Peripheral vascular disease, unspecified: Secondary | ICD-10-CM

## 2016-11-07 DIAGNOSIS — E785 Hyperlipidemia, unspecified: Secondary | ICD-10-CM

## 2016-11-07 DIAGNOSIS — I70212 Atherosclerosis of native arteries of extremities with intermittent claudication, left leg: Secondary | ICD-10-CM

## 2016-11-07 NOTE — Progress Notes (Signed)
Subjective:    Patient ID: [name], female    DOB: [birth year], 82 y.o.   MRN: [id number] Chief Complaint  Patient presents with  . Re-evaluation    Ultrasound follow up   Patient presents for 3 month follow-up. Patient is without complaint with the exception of some minor cramping in her left lower extremity calf. Endorses a history of intermittent cramping with activity localized to the left calf. Patient states that this does not always happen during activity patient denies any rest pain or ulceration to her lower extremity. The patient underwent a bilateral lower extremity arterial duplex which was notable for triphasic waveforms the length of the right lower extremity with an ABI of 1.04. Patient's left lower extremity is biphasic to the distal superficial femoral artery when it then transitions to triphasic distally with ABI 0.71. We'll compared to 05/10/2016 ABIs this is an improvement.   Review of Systems  Constitutional: Negative.   HENT: Negative.   Eyes: Negative.   Respiratory: Negative.   Cardiovascular:       Left lower extremity intermittent calf cramping  Gastrointestinal: Negative.   Endocrine: Negative.   Genitourinary: Negative.   Musculoskeletal: Negative.   Skin: Negative.   Allergic/Immunologic: Negative.   Neurological: Negative.   Hematological: Negative.   Psychiatric/Behavioral: Negative.       Objective:   Physical Exam  Constitutional: She is oriented to person, place, and time. She appears well-developed and well-nourished.  HENT:  Head: Normocephalic and atraumatic.  Eyes: Conjunctivae are normal. Pupils are equal, round, and reactive to light.  Neck: Normal range of motion.  Cardiovascular: Normal rate, regular rhythm, normal heart sounds and intact distal pulses.   Pulses:      Radial pulses are 2+ on the right side, and 2+ on the left side.       Dorsalis pedis pulses are 2+ on the right side, and 2+ on the left side.   Posterior tibial pulses are 2+ on the right side, and 2+ on the left side.  Pulmonary/Chest: Effort normal.  Musculoskeletal: Normal range of motion. She exhibits edema (Mild edema noted bilaterally).  Neurological: She is alert and oriented to person, place, and time.  Skin: Skin is warm and dry.  Psychiatric: She has a normal mood and affect. Her behavior is normal. Judgment and thought content normal.  Vitals reviewed.  BP (!) 189/79 (BP Location: Right Arm)   Pulse 78   Resp 15   Ht 5\' 5"  (1.651 m)   Wt 176 lb (79.8 kg)   BMI 29.29 kg/m   Past Medical History:  Diagnosis Date  . Breast cancer ([place])   . Depression   . Diverticulosis   . GERD (gastroesophageal reflux disease)   . Hiatal hernia   . History of Bell's palsy   . History of colon polyps   . History of nephrolithiasis   . History of pancreatitis   . Hyperlipidemia   . IBS (irritable bowel syndrome)   . Osteoporosis   . Temporal arteritis ([place])   . Tubular adenoma of colon    Social History   Social History  . Marital status: Widowed    Spouse name: N/A  . Number of children: 2  . Years of education: college   Occupational History  . engineering   . Retired    Social History Main Topics  . Smoking status: Former Smoker    Quit date: 05/17/2005  . Smokeless tobacco: Never Used  Comment: quit 11 years ago  . Alcohol use No  . Drug use: No  . Sexual activity: Not on file   Other Topics Concern  . Not on file   Social History Narrative   Patient lives at home alone.    Patient is retired.    Patient has some college.    Patient has 2 children.   Past Surgical History:  Procedure Laterality Date  . LAPAROSCOPIC CHOLECYSTECTOMY  2009  . MASTECTOMY  1983   bilateral   Family History  Problem Relation Age of Onset  . Colon cancer Mother 29  . Scleroderma Father   . Stomach cancer Neg Hx    Allergies  Allergen Reactions  . Benzalkonium Chloride     Other reaction(s): Unknown  .  Escitalopram     Deveioped a twitch  . Neomycin-Bacitracin Zn-Polymyx     REACTION: blisters skin      Assessment & Plan:  Patient presents for 3 month follow-up. Patient is without complaint with the exception of some minor cramping in her left lower extremity calf. Endorses a history of intermittent cramping with activity localized to the left calf. Patient states that this does not always happen during activity patient denies any rest pain or ulceration to her lower extremity. The patient underwent a bilateral lower extremity arterial duplex which was notable for triphasic waveforms the length of the right lower extremity with an ABI of 1.04. Patient's left lower extremity is biphasic to the distal superficial femoral artery when it then transitions to triphasic distally with ABI 0.71. We'll compared to 05/10/2016 ABIs this is an improvement.  1. PAD (peripheral artery disease) ([place]) - Stable Patient with improvement in left lower extremity ABI. Patient does experience minor intermittent left lower extremity calf cramping. The patient states that this does not interfere with her ability to function on a daily basis and she is not ready followed with cholangiogram.  Will bring the patient back in 6 months for repeat ABI and bilateral lower extremity arterial duplex.  - VAS [place] ABI WITH/WO TBI; Future - VAS [place] LOWER EXTREMITY ARTERIAL DUPLEX; Future  2. Hyperlipidemia, unspecified hyperlipidemia type - stable Encouraged good control as its slows the progression of atherosclerotic disease  Current Outpatient Prescriptions on File Prior to Visit  Medication Sig Dispense Refill  . ALPRAZolam (XANAX) 0.25 MG tablet As needed     . Biotin 1000 MCG tablet Take 1,000 mcg by mouth 2 (two) times daily.      . Cholecalciferol (VITAMIN D3) 1000 UNITS CAPS Take by mouth 2 (two) times daily.      . Cyanocobalamin (B-12 PO) Take by mouth daily.    Marland Kitchen dicyclomine (BENTYL) 20 MG tablet Take 1 tablet (20 mg  total) by mouth 3 (three) times daily before meals. 90 tablet 3  . Multiple Vitamin (MULTI-VITAMINS) TABS Take by mouth.    . predniSONE (DELTASONE) 5 MG tablet Take 5 mg by mouth daily.     . traMADol (ULTRAM) 50 MG tablet One tablet by mouth twice daily     . conjugated estrogens (PREMARIN) vaginal cream Place 1 Applicatorful vaginally daily. Patient was given a sample of vaginal estrogen cream and instructed to apply 0.5mg  (pea-sized amount)  just inside the vaginal introitus with a finger-tip every night for two weeks and then Monday, Wednesday and Friday nights. (Patient not taking: Reported on 11/07/2016) 42.5 g 12   No current facility-administered medications on file prior to visit.     There  are no Patient Instructions on file for this visit. No Follow-up on file.   Rutha Melgoza A Garyson Stelly, PA-C

## 2016-12-25 ENCOUNTER — Ambulatory Visit (INDEPENDENT_AMBULATORY_CARE_PROVIDER_SITE_OTHER): Payer: Medicare Other

## 2016-12-25 ENCOUNTER — Telehealth: Payer: Self-pay | Admitting: *Deleted

## 2016-12-25 ENCOUNTER — Ambulatory Visit (INDEPENDENT_AMBULATORY_CARE_PROVIDER_SITE_OTHER): Payer: Medicare Other | Admitting: Podiatry

## 2016-12-25 ENCOUNTER — Encounter: Payer: Self-pay | Admitting: Podiatry

## 2016-12-25 DIAGNOSIS — M779 Enthesopathy, unspecified: Secondary | ICD-10-CM

## 2016-12-25 DIAGNOSIS — I739 Peripheral vascular disease, unspecified: Secondary | ICD-10-CM

## 2016-12-25 NOTE — Telephone Encounter (Addendum)
-----   Message from [place] sent at 12/25/2016  1:57 PM EDT ----- Regarding: Vascular referral This is a  pt, but Dr. Milinda Pointer would like for her to be seen at [facility], instead of [facility]. Can you set this one up for this pt please??   She needs a dopplers and consult! Thanks!! Faxed orders to [facility] - Doppler lab, and referral, clinicals and demographics to main office.12/26/2016-Faxed referral, clinicals and demographics to main fax, and dopplers to dopplers lab.

## 2016-12-25 NOTE — Progress Notes (Signed)
   Subjective:    Patient ID: [name], female    DOB: 1935-02-28, 82 y.o.   MRN: [id number]  HPI: She presents today with a chief complaint of pain to her ankle and leg. She states that she is had 2 years worth of pain to her left leg symptoms fall she took 2 years ago from a letter. She states that I can walk about 200 feet in my calf locks up it is excruciatingly painful and then she consented for a while and then walk another 200 feet before her leg started to hurt again. She states that she has seen vascular doctors who states that her venous status is good but she says that there is only about 50% of the vascular status on the left side I soon she is meaning that she has either an ABI of 0.5 or she has a 50% stenosis.  Review of Systems  All other systems reviewed and are negative.      Objective:   Physical Exam: Vital signs are stable alert and oriented 3. Pulses are palpable right nonpalpable left capillary fill time is immediate bilaterally. The left leg is a little cooler than the right. There is more edema in the left leg and that of the right. She has no reproducible pain of the medial ankle lateral ankle or the calf. There is no pain on medial and lateral compression of the calf. Cutaneous evaluation demonstrates no open lesion. Orthopedic evaluation rectus foot type normal structure.      Assessment & Plan:  Peripheral vascular disease with symptomatic claudication.  Plan: She is slightly discouraged with her current vascular doctors so we will refer her to doctors in [place]. I will follow up with her once the results are in.

## 2016-12-25 NOTE — Telephone Encounter (Signed)
-----   Message from Rip Harbour, [facility] sent at 12/25/2016  4:55 PM EDT ----- Regarding: RE: Vascular referral Arterial - claudication too  Thanks! ----- Message ----- From: Andres Ege, RN Sent: 12/25/2016   4:12 PM To: Rip Harbour, PM[facility] Subject: RE: Vascular referral                          Caryl Pina, I don't have clinicals for today yet, so is it to be arterials or venous dopplers. Marcy Siren ----- Message ----- From: Rip Harbour, PMAC Sent: 12/25/2016   1:57 PM To: Andres Ege, RN Subject: Vascular referral                              This is a [place] pt, but Dr. Milinda Pointer would like for her to be seen at [facility] and V[facility], instead of [facility]. Can you set this one up for this pt please??   She needs a dopplers and consult! Thanks!!

## 2017-01-11 ENCOUNTER — Encounter (HOSPITAL_COMMUNITY): Payer: Medicare Other

## 2017-02-20 ENCOUNTER — Ambulatory Visit (INDEPENDENT_AMBULATORY_CARE_PROVIDER_SITE_OTHER): Payer: Medicare Other | Admitting: Vascular Surgery

## 2017-02-20 ENCOUNTER — Encounter (INDEPENDENT_AMBULATORY_CARE_PROVIDER_SITE_OTHER): Payer: Self-pay | Admitting: Vascular Surgery

## 2017-02-20 VITALS — BP 141/67 | HR 86 | Resp 16 | Wt 174.8 lb

## 2017-02-20 DIAGNOSIS — M316 Other giant cell arteritis: Secondary | ICD-10-CM | POA: Diagnosis not present

## 2017-02-20 DIAGNOSIS — E785 Hyperlipidemia, unspecified: Secondary | ICD-10-CM

## 2017-02-20 DIAGNOSIS — I70212 Atherosclerosis of native arteries of extremities with intermittent claudication, left leg: Secondary | ICD-10-CM | POA: Diagnosis not present

## 2017-02-20 NOTE — Progress Notes (Signed)
MRN : [id number]  [name] is a 82 y.o. (04-Apr-1935) female who presents with chief complaint of  Chief Complaint  Patient presents with  . Leg Pain  .  History of Present Illness: Patient returns today in follow up of leg pain. It sounds like her left leg claudication symptoms are worsening. She says at times she will wake up in the middle the night and her foot is numb. She does not have any right leg claudication symptoms. This would correlate with her noninvasive studies from several months ago showed moderate arterial disease on the left and no significant arterial disease on the right. She does not have ulceration or infection. The pain is mostly in her calf and ankle area.  Current Outpatient Prescriptions  Medication Sig Dispense Refill  . ALPRAZolam (XANAX) 0.25 MG tablet As needed     . Biotin 1000 MCG tablet Take 1,000 mcg by mouth 2 (two) times daily.      . Cholecalciferol (VITAMIN D3) 1000 UNITS CAPS Take by mouth 2 (two) times daily.      . Cyanocobalamin (B-12 PO) Take by mouth daily.    Marland Kitchen dicyclomine (BENTYL) 20 MG tablet Take 1 tablet (20 mg total) by mouth 3 (three) times daily before meals. 90 tablet 3  . estradiol (ESTRACE) 1 MG tablet   0  . LOPREEZA 0.5-0.1 MG tablet   1  . medroxyPROGESTERone (PROVERA) 2.5 MG tablet   0  . predniSONE (DELTASONE) 5 MG tablet Take 5 mg by mouth daily.     . traMADol (ULTRAM) 50 MG tablet One tablet by mouth twice daily     . amoxicillin-clavulanate (AUGMENTIN) 875-125 MG tablet take 1 tablet by mouth twice a day for 7 days  0  . fluticasone (FLONASE) 50 MCG/ACT nasal spray instill 1 spray into each nostril twice a day  0  . Multiple Vitamin (MULTI-VITAMINS) TABS Take by mouth.     No current facility-administered medications for this visit.     Past Medical History:  Diagnosis Date  . Breast cancer ([place])   . Depression   . Diverticulosis   . GERD (gastroesophageal reflux disease)   . Hiatal hernia   . History  of Bell's palsy   . History of colon polyps   . History of nephrolithiasis   . History of pancreatitis   . Hyperlipidemia   . IBS (irritable bowel syndrome)   . Osteoporosis   . Temporal arteritis ([place])   . Tubular adenoma of colon     Past Surgical History:  Procedure Laterality Date  . LAPAROSCOPIC CHOLECYSTECTOMY  2009  . MASTECTOMY  1983   bilateral     Family History  Problem Relation Age of Onset  . Colon cancer Mother 46  . Scleroderma Father   . Stomach cancer Neg Hx   No bleeding or clotting disorders  Social History       Social History  Substance Use Topics  . Smoking status: Former Smoker    Quit date: 05/17/2005  . Smokeless tobacco: Never Used     Comment: quit 11 years ago  . Alcohol use No  No IV drug use       Allergies  Allergen Reactions  . Escitalopram     Deveioped a twitch  . Neomycin-Bacitracin Zn-Polymyx     REACTION: blisters skin       REVIEW OF SYSTEMS (Negative unless checked)  Constitutional: [] Weight loss  [] Fever  [] Chills Cardiac: [] Chest pain   []   Chest pressure   [] Palpitations   [] Shortness of breath when laying flat   [] Shortness of breath at rest   [] Shortness of breath with exertion. Vascular:  [x] Pain in legs with walking   [] Pain in legs at rest   [] Pain in legs when laying flat   [x] Claudication   [] Pain in feet when walking  [] Pain in feet at rest  [] Pain in feet when laying flat   [] History of DVT   [] Phlebitis   [] Swelling in legs   [x] Varicose veins   [] Non-healing ulcers Pulmonary:   [] Uses home oxygen   [] Productive cough   [] Hemoptysis   [] Wheeze  [] COPD   [] Asthma Neurologic:  [] Dizziness  [] Blackouts   [] Seizures   [] History of stroke   [] History of TIA  [] Aphasia   [] Temporary blindness   [] Dysphagia   [] Weakness or numbness in arms   [] Weakness or numbness in legs Musculoskeletal:  [] Arthritis   [] Joint swelling   [] Joint pain   [x] Low back pain Hematologic:  [] Easy bruising  [] Easy  bleeding   [] Hypercoagulable state   [] Anemic  [] Hepatitis Gastrointestinal:  [] Blood in stool   [] Vomiting blood  [] Gastroesophageal reflux/heartburn   [] Abdominal pain Genitourinary:  [] Chronic kidney disease   [] Difficult urination  [] Frequent urination  [] Burning with urination   [] Hematuria Skin:  [] Rashes   [] Ulcers   [] Wounds Psychological:  [] History of anxiety   []  History of major depression.      Physical Examination  BP (!) 141/67 (BP Location: Right Arm)   Pulse 86   Resp 16   Wt 174 lb 12.8 oz (79.3 kg)   BMI 29.09 kg/m  Gen:  WD/WN, NAD. Appears younger than stated age Head: [place]/AT, No temporalis wasting. Ear/Nose/Throat: Hearing grossly intact, nares w/o erythema or drainage, trachea midline Eyes: Conjunctiva clear. Sclera non-icteric Neck: Supple.  No JVD.  Pulmonary:  Good air movement, no use of accessory muscles.  Cardiac: RRR, normal S1, S2 Vascular:  Vessel Right Left  Radial Palpable Palpable                          PT Palpable Trace Palpable  DP Palpable Palpable    Musculoskeletal: M/S 5/5 throughout.  No deformity or atrophy. 1+ left lower extremity edema. Neurologic: Sensation grossly intact in extremities.  Symmetrical.  Speech is fluent.  Psychiatric: Judgment intact, Mood & affect appropriate for pt's clinical situation. Dermatologic: No rashes or ulcers noted.  No cellulitis or open wounds.       Labs No results found for this or any previous visit (from the past 2160 hour(s)).  Radiology No results found.    Assessment/Plan TEMPORAL ARTERITIS Takes prednisone daily.  HLD (hyperlipidemia) lipid control important in reducing the progression of atherosclerotic disease.  Atherosclerosis of native arteries of extremity with intermittent claudication ([place]) Sounds like the patient's claudication symptoms have worsened. She does not have any ulceration or infection or obvious acute limb threat. Some of the numbness with waking  could be the early stages of rest pain. We discussed today options including proceeding with intervention versus continued exercise and medical regimen. She does not want have an intervention at current and would like to keep her follow-up appointment in 2-3 months with noninvasive studies. I have told her if she develops clear ischemic rest pain, nonhealing ulceration, or tissue breakdown she is to contact our office immediately and at that point I would strongly recommend revascularization.    Leotis Pain, MD  02/20/2017 10:54 AM    This note was created with Dragon medical transcription system.  Any errors from dictation are purely unintentional

## 2017-02-20 NOTE — Patient Instructions (Signed)

## 2017-02-20 NOTE — Assessment & Plan Note (Signed)
Sounds like the patient's claudication symptoms have worsened. She does not have any ulceration or infection or obvious acute limb threat. Some of the numbness with waking could be the early stages of rest pain. We discussed today options including proceeding with intervention versus continued exercise and medical regimen. She does not want have an intervention at current and would like to keep her follow-up appointment in 2-3 months with noninvasive studies. I have told her if she develops clear ischemic rest pain, nonhealing ulceration, or tissue breakdown she is to contact our office immediately and at that point I would strongly recommend revascularization.

## 2017-04-16 ENCOUNTER — Encounter: Payer: Self-pay | Admitting: *Deleted

## 2017-04-16 ENCOUNTER — Emergency Department
Admission: EM | Admit: 2017-04-16 | Discharge: 2017-04-16 | Disposition: A | Discharge status: Home / Self Care | Payer: Medicare Other | Attending: Emergency Medicine | Admitting: Emergency Medicine

## 2017-04-16 ENCOUNTER — Emergency Department: Payer: Medicare Other

## 2017-04-16 DIAGNOSIS — R531 Weakness: Secondary | ICD-10-CM | POA: Insufficient documentation

## 2017-04-16 DIAGNOSIS — Z87891 Personal history of nicotine dependence: Secondary | ICD-10-CM | POA: Diagnosis not present

## 2017-04-16 DIAGNOSIS — I2510 Atherosclerotic heart disease of native coronary artery without angina pectoris: Secondary | ICD-10-CM | POA: Insufficient documentation

## 2017-04-16 DIAGNOSIS — R110 Nausea: Secondary | ICD-10-CM | POA: Diagnosis present

## 2017-04-16 LAB — COMPREHENSIVE METABOLIC PANEL
ALBUMIN: 3.7 g/dL (ref 3.5–5.0)
ALT: 12 U/L — ABNORMAL LOW (ref 14–54)
AST: 25 U/L (ref 15–41)
Alkaline Phosphatase: 94 U/L (ref 38–126)
Anion gap: 11 (ref 5–15)
BUN: 11 mg/dL (ref 6–20)
CHLORIDE: 101 mmol/L (ref 101–111)
CO2: 25 mmol/L (ref 22–32)
Calcium: 8.6 mg/dL — ABNORMAL LOW (ref 8.9–10.3)
Creatinine, Ser: 1 mg/dL (ref 0.44–1.00)
GFR calc Af Amer: >60 mL/min (ref >60)
GFR calc non Af Amer: 52 mL/min — ABNORMAL LOW (ref >60)
GLUCOSE: 90 mg/dL (ref 65–99)
POTASSIUM: 3.8 mmol/L (ref 3.5–5.1)
Sodium: 137 mmol/L (ref 135–145)
Total Bilirubin: 1.5 mg/dL — ABNORMAL HIGH (ref 0.3–1.2)
Total Protein: 7 g/dL (ref 6.5–8.1)

## 2017-04-16 LAB — CBC WITH DIFFERENTIAL/PLATELET
BASOS ABS: 0 10*3/uL (ref 0–0.1)
BASOS PCT: 1 %
EOS PCT: 1 %
Eosinophils Absolute: 0 10*3/uL (ref 0–0.7)
HCT: 42.9 % (ref 35.0–47.0)
Hemoglobin: 14 g/dL (ref 12.0–16.0)
Lymphocytes Relative: 25 %
Lymphs Abs: 0.9 10*3/uL — ABNORMAL LOW (ref 1.0–3.6)
MCH: 29.8 pg (ref 26.0–34.0)
MCHC: 32.6 g/dL (ref 32.0–36.0)
MCV: 91.6 fL (ref 80.0–100.0)
MONO ABS: 0.5 10*3/uL (ref 0.2–0.9)
Monocytes Relative: 13 %
NEUTROS ABS: 2.3 10*3/uL (ref 1.4–6.5)
Neutrophils Relative %: 60 %
PLATELETS: 215 10*3/uL (ref 150–440)
RBC: 4.68 MIL/uL (ref 3.80–5.20)
RDW: 14.6 % — AB (ref 11.5–14.5)
WBC: 3.7 10*3/uL (ref 3.6–11.0)

## 2017-04-16 LAB — URINALYSIS, COMPLETE (UACMP) WITH MICROSCOPIC
BACTERIA UA: NONE SEEN
Bilirubin Urine: NEGATIVE
Glucose, UA: NEGATIVE mg/dL
Hgb urine dipstick: NEGATIVE
KETONES UR: NEGATIVE mg/dL
LEUKOCYTES UA: NEGATIVE
Nitrite: NEGATIVE
PROTEIN: NEGATIVE mg/dL
Specific Gravity, Urine: 1.003 — ABNORMAL LOW (ref 1.005–1.030)
WBC, UA: NONE SEEN WBC/hpf (ref 0–5)
pH: 7 (ref 5.0–8.0)

## 2017-04-16 LAB — SEDIMENTATION RATE: Sed Rate: 26 mm/hr (ref 0–30)

## 2017-04-16 LAB — TROPONIN I

## 2017-04-16 MED ORDER — SODIUM CHLORIDE 0.9 % IV SOLN
1000.0000 mL | Freq: Once | INTRAVENOUS | Status: AC
  Administered 2017-04-16: 1000 mL via INTRAVENOUS

## 2017-04-16 MED ORDER — ONDANSETRON 4 MG PO TBDP: 4.0000 mg | ORAL_TABLET | Freq: Three times a day (TID) | ORAL | 0 refills | Status: DC | PRN

## 2017-04-16 MED ORDER — KETOROLAC TROMETHAMINE 30 MG/ML IJ SOLN
15.0000 mg | Freq: Once | INTRAMUSCULAR | Status: AC
  Administered 2017-04-16: 15 mg via INTRAVENOUS
  Filled 2017-04-16: qty 1

## 2017-04-16 MED ORDER — METOCLOPRAMIDE HCL 5 MG/ML IJ SOLN
10.0000 mg | Freq: Once | INTRAMUSCULAR | Status: AC
Start: 2017-04-16 — End: 2017-04-16
  Administered 2017-04-16: 10 mg via INTRAVENOUS
  Filled 2017-04-16: qty 2

## 2017-04-16 NOTE — ED Notes (Signed)
Pt verbalized understanding of discharge instructions. NAD at this time. 

## 2017-04-16 NOTE — ED Notes (Signed)
Pt given ice chips per request. edp ok with.

## 2017-04-16 NOTE — ED Provider Notes (Signed)
[facility] Emergency Department Provider Note       Time seen: ----------------------------------------- 8:55 AM on 04/16/2017 -----------------------------------------   I have reviewed the triage vital signs and the nursing notes.  HISTORY   Chief Complaint Weakness and Nausea    HPI [name] is a 82 y.o. female with a history of depression, temporal arteritis and peripheral vascular disease who presents to the ED for weakness and nausea.  Patient states she was treated in the last month for a sinus infection and had taken antibiotics but is not currently on any.  She was told her sed rate was going up and she continues to feel weak and nauseous.  She is also having a frontal headache.  Pain is 7 out of 10, nothing makes it better or worse.  She main complaint right now is weakness and lack of energy.  Currently she is on 10 mg of prednisone a day.  Past Medical History:  Diagnosis Date  . Breast cancer ([place])   . Depression   . Diverticulosis   . GERD (gastroesophageal reflux disease)   . Hiatal hernia   . History of Bell's palsy   . History of colon polyps   . History of nephrolithiasis   . History of pancreatitis   . Hyperlipidemia   . IBS (irritable bowel syndrome)   . Osteoporosis   . Temporal arteritis ([place])   . Tubular adenoma of colon     Patient Active Problem List   Diagnosis Date Noted  . Atherosclerosis of native arteries of extremity with intermittent claudication ([place]) 08/01/2016  . PAD (peripheral artery disease) ([place]) 05/10/2016  . Pain in limb 05/02/2016  . Atrophic vaginitis 11/04/2014  . Cystocele, grade 2 11/04/2014  . Nephrolithiasis 11/04/2014  . H/O neoplasm 07/27/2014  . History of nonmelanoma skin cancer 07/27/2014  . Hyperlipidemia, unspecified 04/14/2014  . Osteoporosis 01/07/2014  . Osteoarthritis 01/07/2014  . Biliary tract disorder 01/07/2014  . Anosmia 03/19/2013  . PANCREATITIS 05/27/2008  .  NAUSEA 05/27/2008  . DIARRHEA 05/27/2008  . ABDOMINAL PAIN-EPIGASTRIC 05/27/2008  . Temporal arteritis ([place]) 08/05/2007  . OSTEOPOROSIS 08/05/2007  . IRRITABLE BOWEL SYNDROME, HX OF 08/05/2007  . DIVERTICULOSIS, COLON 03/08/2007  . COLONIC POLYPS, ADENOMATOUS 10/28/1998  . GERD 10/28/1998  . HIATAL HERNIA 10/28/1998    Past Surgical History:  Procedure Laterality Date  . LAPAROSCOPIC CHOLECYSTECTOMY  2009  . MASTECTOMY  1983   bilateral    Allergies Benzalkonium chloride; Escitalopram; and Neomycin-bacitracin zn-polymyx  Social History Social History   Tobacco Use  . Smoking status: Former Smoker    Last attempt to quit: 05/17/2005    Years since quitting: 11.9  . Smokeless tobacco: Never Used  . Tobacco comment: quit 11 years ago  Substance Use Topics  . Alcohol use: No    Alcohol/week: 0.0 oz  . Drug use: No    Review of Systems Constitutional: Negative for fever. Eyes: Negative for vision changes ENT:  Negative for congestion, sore throat Cardiovascular: Negative for chest pain. Respiratory: Negative for shortness of breath. Gastrointestinal: Negative for abdominal pain, positive for nausea Genitourinary: Negative for dysuria. Musculoskeletal: Negative for back pain. Skin: Negative for rash. Neurological: Positive for headache and weakness  All systems negative/normal/unremarkable except as stated in the HPI  ____________________________________________   PHYSICAL EXAM:  VITAL SIGNS: ED Triage Vitals [04/16/17 0843]  Enc Vitals Group     BP (!) 146/47     Pulse Rate 94     Resp  18     Temp 98.4 F (36.9 C)     Temp Source Oral     SpO2 98 %     Weight 174 lb (78.9 kg)     Height 5\' 5"  (1.651 m)     Head Circumference      Peak Flow      Pain Score 7     Pain Loc      Pain Edu?      Excl. in [place]?     Constitutional: Alert and oriented. Well appearing and in no distress. Eyes: Conjunctivae are normal. Normal extraocular movements. ENT    Head: Normocephalic and atraumatic.  No tenderness over the temporal arteries   Nose: No congestion/rhinnorhea.   Mouth/Throat: Mucous membranes are moist.   Neck: No stridor. Cardiovascular: Normal rate, regular rhythm. No murmurs, rubs, or gallops. Respiratory: Normal respiratory effort without tachypnea nor retractions. Breath sounds are clear and equal bilaterally. No wheezes/rales/rhonchi. Gastrointestinal: Soft and nontender. Normal bowel sounds Musculoskeletal: Nontender with normal range of motion in extremities. No lower extremity tenderness nor edema. Neurologic:  Normal speech and language. No gross focal neurologic deficits are appreciated.  Strength, sensation, cranial nerves appears to be normal. Skin:  Skin is warm, dry and intact. No rash noted. Psychiatric: Mood and affect are normal. Speech and behavior are normal.  ___________________________________________  ED COURSE:  Pertinent labs & imaging results that were available during my care of the patient were reviewed by me and considered in my medical decision making (see chart for details). Patient presents for weakness and nausea, we will assess with labs and imaging as indicated.   Procedures ____________________________________________   LABS (pertinent positives/negatives)  Labs Reviewed  CBC WITH DIFFERENTIAL/PLATELET - Abnormal; Notable for the following components:      Result Value   RDW 14.6 (*)    Lymphs Abs 0.9 (*)    All other components within normal limits  COMPREHENSIVE METABOLIC PANEL - Abnormal; Notable for the following components:   Calcium 8.6 (*)    ALT 12 (*)    Total Bilirubin 1.5 (*)    GFR calc non Af Amer 52 (*)    All other components within normal limits  URINALYSIS, COMPLETE (UACMP) WITH MICROSCOPIC - Abnormal; Notable for the following components:   Color, Urine STRAW (*)    APPearance CLEAR (*)    Specific Gravity, Urine 1.003 (*)    Squamous Epithelial / LPF 0-5 (*)     All other components within normal limits  TROPONIN I  SEDIMENTATION RATE  CBG MONITORING, ED    RADIOLOGY Images were viewed by me  CT head Is negative ____________________________________________  DIFFERENTIAL DIAGNOSIS   Dehydration, electrolyte abnormality, occult infection, medication side effect, MI, CVA, temporal arteritis  FINAL ASSESSMENT AND PLAN  Weakness, nausea   Plan: Patient had presented for weakness and nausea. Patient's labs were reassuring. Patient's imaging was also negative.  Unclear etiology for her symptoms, perhaps medication related.  She will be given Zofran to take as needed for nausea.  She did receive a liter of fluids and is stable for outpatient follow-up.   Earleen Newport, MD   Note: This note was generated in part or whole with voice recognition software. Voice recognition is usually quite accurate but there are transcription errors that can and very often do occur. I apologize for any typographical errors that were not detected and corrected.     Earleen Newport, MD 04/16/17 1150

## 2017-04-16 NOTE — ED Triage Notes (Signed)
States she recently had a sinus infection and was on abx, states hx of temporal arteritis, states she was told her sed rate was going up and she continues to feel weak and nauseas

## 2017-04-16 NOTE — ED Notes (Signed)
Pt returns from CT at this time.

## 2017-04-16 NOTE — ED Notes (Signed)
Patient transported to CT 

## 2017-05-15 ENCOUNTER — Ambulatory Visit (INDEPENDENT_AMBULATORY_CARE_PROVIDER_SITE_OTHER): Payer: Medicare Other | Admitting: Vascular Surgery

## 2017-05-15 ENCOUNTER — Encounter (INDEPENDENT_AMBULATORY_CARE_PROVIDER_SITE_OTHER): Payer: Medicare Other

## 2017-05-17 ENCOUNTER — Other Ambulatory Visit: Payer: Self-pay | Admitting: Rheumatology

## 2017-05-17 DIAGNOSIS — R899 Unspecified abnormal finding in specimens from other organs, systems and tissues: Secondary | ICD-10-CM

## 2017-05-24 ENCOUNTER — Ambulatory Visit
Admission: RE | Admit: 2017-05-24 | Discharge: 2017-05-24 | Disposition: A | Discharge status: Home / Self Care | Payer: Medicare Other | Source: Ambulatory Visit | Attending: Rheumatology | Admitting: Rheumatology

## 2017-05-24 DIAGNOSIS — L74519 Primary focal hyperhidrosis, unspecified: Secondary | ICD-10-CM | POA: Diagnosis not present

## 2017-05-24 DIAGNOSIS — N200 Calculus of kidney: Secondary | ICD-10-CM | POA: Diagnosis not present

## 2017-05-24 DIAGNOSIS — K449 Diaphragmatic hernia without obstruction or gangrene: Secondary | ICD-10-CM | POA: Diagnosis not present

## 2017-05-24 DIAGNOSIS — R899 Unspecified abnormal finding in specimens from other organs, systems and tissues: Secondary | ICD-10-CM

## 2017-05-24 DIAGNOSIS — K5792 Diverticulitis of intestine, part unspecified, without perforation or abscess without bleeding: Secondary | ICD-10-CM | POA: Diagnosis not present

## 2017-06-07 ENCOUNTER — Encounter: Payer: Self-pay | Admitting: Internal Medicine

## 2017-06-14 ENCOUNTER — Emergency Department: Payer: Medicare Other

## 2017-06-14 ENCOUNTER — Other Ambulatory Visit: Payer: Self-pay

## 2017-06-14 ENCOUNTER — Inpatient Hospital Stay
Admission: EM | Admit: 2017-06-14 | Discharge: 2017-06-17 | DRG: 392 | Disposition: A | Discharge status: Home / Self Care | Payer: Medicare Other | Attending: Internal Medicine | Admitting: Internal Medicine

## 2017-06-14 DIAGNOSIS — Z79890 Hormone replacement therapy (postmenopausal): Secondary | ICD-10-CM

## 2017-06-14 DIAGNOSIS — Z87891 Personal history of nicotine dependence: Secondary | ICD-10-CM

## 2017-06-14 DIAGNOSIS — R4182 Altered mental status, unspecified: Secondary | ICD-10-CM | POA: Diagnosis present

## 2017-06-14 DIAGNOSIS — E860 Dehydration: Secondary | ICD-10-CM | POA: Diagnosis not present

## 2017-06-14 DIAGNOSIS — Z86010 Personal history of colonic polyps: Secondary | ICD-10-CM

## 2017-06-14 DIAGNOSIS — F411 Generalized anxiety disorder: Secondary | ICD-10-CM | POA: Diagnosis present

## 2017-06-14 DIAGNOSIS — E876 Hypokalemia: Secondary | ICD-10-CM | POA: Diagnosis present

## 2017-06-14 DIAGNOSIS — R531 Weakness: Secondary | ICD-10-CM | POA: Diagnosis not present

## 2017-06-14 DIAGNOSIS — K5792 Diverticulitis of intestine, part unspecified, without perforation or abscess without bleeding: Secondary | ICD-10-CM | POA: Diagnosis not present

## 2017-06-14 DIAGNOSIS — K630 Abscess of intestine: Secondary | ICD-10-CM | POA: Diagnosis not present

## 2017-06-14 DIAGNOSIS — N12 Tubulo-interstitial nephritis, not specified as acute or chronic: Secondary | ICD-10-CM

## 2017-06-14 DIAGNOSIS — Z853 Personal history of malignant neoplasm of breast: Secondary | ICD-10-CM | POA: Diagnosis not present

## 2017-06-14 DIAGNOSIS — M316 Other giant cell arteritis: Secondary | ICD-10-CM | POA: Diagnosis present

## 2017-06-14 DIAGNOSIS — Z7982 Long term (current) use of aspirin: Secondary | ICD-10-CM

## 2017-06-14 DIAGNOSIS — K5720 Diverticulitis of large intestine with perforation and abscess without bleeding: Secondary | ICD-10-CM | POA: Diagnosis not present

## 2017-06-14 DIAGNOSIS — K5732 Diverticulitis of large intestine without perforation or abscess without bleeding: Principal | ICD-10-CM

## 2017-06-14 LAB — CBC
HCT: 42.2 % (ref 35.0–47.0)
HEMOGLOBIN: 13.9 g/dL (ref 12.0–16.0)
MCH: 29.8 pg (ref 26.0–34.0)
MCHC: 33 g/dL (ref 32.0–36.0)
MCV: 90.1 fL (ref 80.0–100.0)
Platelets: 240 10*3/uL (ref 150–440)
RBC: 4.68 MIL/uL (ref 3.80–5.20)
RDW: 15 % — ABNORMAL HIGH (ref 11.5–14.5)
WBC: 2.5 10*3/uL — ABNORMAL LOW (ref 3.6–11.0)

## 2017-06-14 LAB — URINALYSIS, COMPLETE (UACMP) WITH MICROSCOPIC
Bilirubin Urine: NEGATIVE
GLUCOSE, UA: NEGATIVE mg/dL
Hgb urine dipstick: NEGATIVE
Ketones, ur: NEGATIVE mg/dL
Nitrite: NEGATIVE
PROTEIN: NEGATIVE mg/dL
Specific Gravity, Urine: 1.023 (ref 1.005–1.030)
pH: 6 (ref 5.0–8.0)

## 2017-06-14 LAB — COMPREHENSIVE METABOLIC PANEL
ALT: 9 U/L — AB (ref 14–54)
AST: 23 U/L (ref 15–41)
Albumin: 3.5 g/dL (ref 3.5–5.0)
Alkaline Phosphatase: 59 U/L (ref 38–126)
Anion gap: 10 (ref 5–15)
BUN: 7 mg/dL (ref 6–20)
CHLORIDE: 104 mmol/L (ref 101–111)
CO2: 24 mmol/L (ref 22–32)
CREATININE: 1.04 mg/dL — AB (ref 0.44–1.00)
Calcium: 8 mg/dL — ABNORMAL LOW (ref 8.9–10.3)
GFR calc Af Amer: 57 mL/min — ABNORMAL LOW (ref >60)
GFR, EST NON AFRICAN AMERICAN: 49 mL/min — AB (ref >60)
Glucose, Bld: 132 mg/dL — ABNORMAL HIGH (ref 65–99)
Potassium: 3.9 mmol/L (ref 3.5–5.1)
Sodium: 138 mmol/L (ref 135–145)
Total Bilirubin: 0.8 mg/dL (ref 0.3–1.2)
Total Protein: 6.4 g/dL — ABNORMAL LOW (ref 6.5–8.1)

## 2017-06-14 MED ORDER — PIPERACILLIN-TAZOBACTAM 3.375 G IVPB 30 MIN
3.3750 g | Freq: Once | INTRAVENOUS | Status: AC
  Administered 2017-06-14: 3.375 g via INTRAVENOUS
  Filled 2017-06-14: qty 50

## 2017-06-14 MED ORDER — SODIUM CHLORIDE 0.9 % IV SOLN
INTRAVENOUS | Status: DC
  Administered 2017-06-14 – 2017-06-16 (×5): via INTRAVENOUS

## 2017-06-14 MED ORDER — ONDANSETRON HCL 4 MG PO TABS: 4.0000 mg | ORAL_TABLET | Freq: Four times a day (QID) | ORAL | Status: DC | PRN

## 2017-06-14 MED ORDER — IOPAMIDOL (ISOVUE-370) INJECTION 76%
75.0000 mL | Freq: Once | INTRAVENOUS | Status: AC | PRN
  Administered 2017-06-14: 75 mL via INTRAVENOUS

## 2017-06-14 MED ORDER — FENTANYL CITRATE (PF) 100 MCG/2ML IJ SOLN
50.0000 ug | Freq: Once | INTRAMUSCULAR | Status: AC
  Administered 2017-06-14: 50 ug via INTRAVENOUS
  Filled 2017-06-14: qty 2

## 2017-06-14 MED ORDER — ACETAMINOPHEN 650 MG RE SUPP: 650.0000 mg | Freq: Four times a day (QID) | RECTAL | Status: DC | PRN

## 2017-06-14 MED ORDER — PREDNISONE 10 MG PO TABS
10.0000 mg | ORAL_TABLET | Freq: Every day | ORAL | Status: DC
  Administered 2017-06-15 – 2017-06-17 (×3): 10 mg via ORAL
  Filled 2017-06-14 (×3): qty 1

## 2017-06-14 MED ORDER — ENOXAPARIN SODIUM 40 MG/0.4ML ~~LOC~~ SOLN
40.0000 mg | SUBCUTANEOUS | Status: DC
  Administered 2017-06-14 – 2017-06-16 (×3): 40 mg via SUBCUTANEOUS
  Filled 2017-06-14 (×3): qty 0.4

## 2017-06-14 MED ORDER — ASPIRIN EC 81 MG PO TBEC
81.0000 mg | DELAYED_RELEASE_TABLET | Freq: Every day | ORAL | Status: DC
  Administered 2017-06-15 – 2017-06-17 (×3): 81 mg via ORAL
  Filled 2017-06-14 (×3): qty 1

## 2017-06-14 MED ORDER — IOPAMIDOL (ISOVUE-300) INJECTION 61%
30.0000 mL | Freq: Once | INTRAVENOUS | Status: AC | PRN
  Administered 2017-06-14: 30 mL via ORAL

## 2017-06-14 MED ORDER — VITAMIN D 1000 UNITS PO TABS
2000.0000 [IU] | ORAL_TABLET | Freq: Every day | ORAL | Status: DC
  Administered 2017-06-15 – 2017-06-17 (×3): 2000 [IU] via ORAL
  Filled 2017-06-14 (×4): qty 2

## 2017-06-14 MED ORDER — DICYCLOMINE HCL 20 MG PO TABS
20.0000 mg | ORAL_TABLET | Freq: Three times a day (TID) | ORAL | Status: DC
  Administered 2017-06-15 – 2017-06-17 (×6): 20 mg via ORAL
  Filled 2017-06-14 (×9): qty 1

## 2017-06-14 MED ORDER — ESTRADIOL 1 MG PO TABS
1.0000 mg | ORAL_TABLET | Freq: Every day | ORAL | Status: DC
  Administered 2017-06-15 – 2017-06-17 (×3): 1 mg via ORAL
  Filled 2017-06-14 (×3): qty 1

## 2017-06-14 MED ORDER — ACETAMINOPHEN 325 MG PO TABS: 650.0000 mg | ORAL_TABLET | Freq: Four times a day (QID) | ORAL | Status: DC | PRN

## 2017-06-14 MED ORDER — SODIUM CHLORIDE 0.9 % IV BOLUS (SEPSIS)
1000.0000 mL | Freq: Once | INTRAVENOUS | Status: AC
  Administered 2017-06-14: 1000 mL via INTRAVENOUS

## 2017-06-14 MED ORDER — ONDANSETRON HCL 4 MG/2ML IJ SOLN: 4.0000 mg | Freq: Four times a day (QID) | INTRAMUSCULAR | Status: DC | PRN

## 2017-06-14 MED ORDER — PIPERACILLIN-TAZOBACTAM 3.375 G IVPB
3.3750 g | Freq: Three times a day (TID) | INTRAVENOUS | Status: DC
  Administered 2017-06-15 – 2017-06-17 (×7): 3.375 g via INTRAVENOUS
  Filled 2017-06-14 (×10): qty 50

## 2017-06-14 MED ORDER — ALPRAZOLAM 0.5 MG PO TABS
0.2500 mg | ORAL_TABLET | Freq: Two times a day (BID) | ORAL | Status: DC | PRN
  Administered 2017-06-14 – 2017-06-15 (×2): 0.25 mg via ORAL
  Filled 2017-06-14 (×2): qty 1

## 2017-06-14 NOTE — ED Triage Notes (Signed)
Pt presentst oday via [facility] from home for generalized weakness. Pt has dx with diverticulitis 2 weeks ago and has been taking Cipro and Levaquin for 2 weeks. Pt denies n/v/d. Pt is afebrile A&ox4

## 2017-06-14 NOTE — Consult Note (Signed)
SURGICAL CONSULTATION NOTE (initial) - cpt: 99254  HISTORY OF PRESENT ILLNESS (HPI):  83 y.o. female presented to [facility] ED for evaluation of generalized fatigue/lethargy and altered mental status/confusion. Patient reports she 2 weeks ago started oral Cipro and Flagyl for acute sigmoid colonic diverticulitis. Since starting the antibiotics, patient reports (and her daughter at bedside confirms) that she has continued to feel better except for developing diarrhea about 1 week after starting the antibiotics. The diarrhea has improved over the past 2 days or so, but over the past 2 days patient's daughter describes the patient "hasn't been herself" with increasing confusion and lethargy, adding that this is sometimes how patient gets "when she is dehydrated". While in the ED, patient and her daughter describe that patient's color, alertness, and energy have all improved with IV fluids she's been receiving. Patient otherwise describes mild improved LLQ abdominal pain and denies N/V, fever/chills, CP, or SOB. She also takes 5 mg Prednisone daily for temporal arteritis. Of note, patient and her daughter also affirm a history of multiple UTI's and prior pyelonephritis. Though patient is unsure whether she's experienced recent burning with urination or urinary frequency, patient's daughter says a UTI with her recent diarrhea wouldn't surprise her.  Surgery is consulted by ED physician Dr. Mariea Clonts in this context for evaluation and management of sigmoid colonic diverticulitis with small pericolonic abscess.  PAST MEDICAL HISTORY (PMH):  Past Medical History:  Diagnosis Date  . Breast cancer ([place])   . Depression   . Diverticulosis   . GERD (gastroesophageal reflux disease)   . Hiatal hernia   . History of Bell's palsy   . History of colon polyps   . History of nephrolithiasis   . History of pancreatitis   . Hyperlipidemia   . IBS (irritable bowel syndrome)   . Osteoporosis   . Temporal arteritis ([place])    . Tubular adenoma of colon      PAST SURGICAL HISTORY ([place]):  Past Surgical History:  Procedure Laterality Date  . LAPAROSCOPIC CHOLECYSTECTOMY  2009  . MASTECTOMY  1983   bilateral     MEDICATIONS:  Prior to Admission medications   Medication Sig Start Date End Date Taking? Authorizing Provider  ALPRAZolam (XANAX) 0.25 MG tablet Take 0.25 mg by mouth 2 (two) times daily as needed.  11/03/10  Yes [provider]  aspirin EC 81 MG tablet Take 81 mg daily by mouth.   Yes [provider]  Cholecalciferol (VITAMIN D3) 2000 units capsule Take 2,000 Units daily by mouth.    Yes [provider]  ciprofloxacin (CIPRO) 500 MG tablet Take 500 mg by mouth 2 (two) times daily. 06/01/17  Yes [provider]  Cyanocobalamin (B-12 PO) Take 2,500 mcg daily by mouth.    Yes [provider]  dicyclomine (BENTYL) 20 MG tablet Take 1 tablet (20 mg total) by mouth 3 (three) times daily before meals. 10/20/14  Yes Lafayette Dragon, MD  estradiol (ESTRACE) 1 MG tablet Take 1 mg daily by mouth.  10/11/16  Yes [provider]  metroNIDAZOLE (FLAGYL) 500 MG tablet Take 500 mg by mouth 3 (three) times daily. 06/01/17  Yes [provider]  predniSONE (DELTASONE) 5 MG tablet Take 10 mg daily by mouth.    Yes [provider]  traMADol (ULTRAM) 50 MG tablet Take 50 mg 2 (two) times daily by mouth. One tablet by mouth twice daily  11/21/10  Yes [provider]  ondansetron (ZOFRAN ODT) 4 MG disintegrating tablet Take  1 tablet (4 mg total) every 8 (eight) hours as needed by mouth for nausea or vomiting. Patient not taking: Reported on 06/14/2017 04/16/17   Earleen Newport, MD     ALLERGIES:  Allergies  Allergen Reactions  . Benzalkonium Chloride     Other reaction(s): Unknown  . Escitalopram     Deveioped a twitch  . Neomycin-Bacitracin Zn-Polymyx     Other reaction(s): Other (See Comments) REACTION: blisters skin REACTION: blisters  skin     SOCIAL HISTORY:  Social History   Socioeconomic History  . Marital status: Widowed    Spouse name: Not on file  . Number of children: 2  . Years of education: college  . Highest education level: Not on file  Social Needs  . Financial resource strain: Not on file  . Food insecurity - worry: Not on file  . Food insecurity - inability: Not on file  . Transportation needs - medical: Not on file  . Transportation needs - non-medical: Not on file  Occupational History  . Occupation: Engineer, production  . Occupation: Retired  Tobacco Use  . Smoking status: Former Smoker    Last attempt to quit: 05/17/2005    Years since quitting: 12.0  . Smokeless tobacco: Never Used  . Tobacco comment: quit 11 years ago  Substance and Sexual Activity  . Alcohol use: No    Alcohol/week: 0.0 oz  . Drug use: No  . Sexual activity: Not on file  Other Topics Concern  . Not on file  Social History Narrative   Patient lives at home alone.    Patient is retired.    Patient has some college.    Patient has 2 children.    The patient currently resides (home / rehab facility / nursing home): Home The patient normally is (ambulatory / bedbound): Ambulatory   FAMILY HISTORY:  Family History  Problem Relation Age of Onset  . Colon cancer Mother 3  . Scleroderma Father   . Stomach cancer Neg Hx      REVIEW OF SYSTEMS:  Constitutional: denies weight loss, fever, chills, or sweats  Eyes: denies any other vision changes, history of eye injury  ENT: denies sore throat, hearing problems  Respiratory: denies shortness of breath, wheezing  Cardiovascular: denies chest pain, palpitations  Gastrointestinal: abdominal pain, N/V, and bowel function as per HPI Genitourinary: denies burning with urination or urinary frequency Musculoskeletal: denies any other joint pains or cramps  Skin: denies any other rashes or skin discolorations  Neurological: weakness as per HPI, denies any other headache or  dizziness  Psychiatric: anxiety baseline, denies any other depression  All other review of systems were negative   VITAL SIGNS:  Temp:  [97.8 F (36.6 C)] 97.8 F (36.6 C) (01/17 1518) Pulse Rate:  [74-93] 74 (01/17 1900) Resp:  [18-24] 19 (01/17 1900) BP: (136-144)/(51-76) 141/69 (01/17 1900) SpO2:  [98 %-100 %] 98 % (01/17 1900) Weight:  [162 lb (73.5 kg)] 162 lb (73.5 kg) (01/17 1518)     Height: 5\' 6"  (167.6 cm) Weight: 162 lb (73.5 kg) BMI (Calculated): 26.16   INTAKE/OUTPUT:  This shift: No intake/output data recorded.  Last 2 shifts: @IOLAST2SHIFTS @   PHYSICAL EXAM:  Constitutional:  -- Normal body habitus  -- Awake, alert, and oriented x3, no apparent distress Eyes:  -- Pupils equally round and reactive to light  -- No scleral icterus, B/L no occular discharge Ear, nose, throat: -- Neck is FROM WNL -- No jugular venous distension  Pulmonary:  --  No wheezes or rhales -- Equal breath sounds bilaterally -- Breathing non-labored at rest Cardiovascular:  -- S1, S2 present  -- No pericardial rubs  Gastrointestinal:  -- Abdomen soft and non-distended with mild-/moderate- LLQ abdominal tenderness to palpation, no guarding or rebound tenderness -- No abdominal masses appreciated, pulsatile or otherwise  Musculoskeletal and Integumentary:  -- Wounds or skin discoloration: None appreciated -- Extremities: B/L UE and LE FROM, hands and feet warm, no edema  Neurologic:  -- Motor function: Intact and symmetric -- Sensation: Intact and symmetric Psychiatric:  -- Mood and affect WNL  Labs:  CBC Latest Ref Rng & Units 06/14/2017 04/16/2017 08/12/2012  WBC 3.6 - 11.0 K/uL 2.5(L) 3.7 5.3  Hemoglobin 12.0 - 16.0 g/dL 13.9 14.0 12.9  Hematocrit 35.0 - 47.0 % 42.2 42.9 38.5  Platelets 150 - 440 K/uL 240 215 237   CMP Latest Ref Rng & Units 06/14/2017 04/16/2017 08/12/2012  Glucose 65 - 99 mg/dL 132(H) 90 80  BUN 6 - 20 mg/dL 7 11 11   Creatinine 0.44 - 1.00 mg/dL 1.04(H) 1.00  0.78  Sodium 135 - 145 mmol/L 138 137 140  Potassium 3.5 - 5.1 mmol/L 3.9 3.8 3.9  Chloride 101 - 111 mmol/L 104 101 107  CO2 22 - 32 mmol/L 24 25 28   Calcium 8.9 - 10.3 mg/dL 8.0(L) 8.6(L) 7.9(L)  Total Protein 6.5 - 8.1 g/dL 6.4(L) 7.0 -  Total Bilirubin 0.3 - 1.2 mg/dL 0.8 1.5(H) -  Alkaline Phos 38 - 126 U/L 59 94 -  AST 15 - 41 U/L 23 25 -  ALT 14 - 54 U/L 9(L) 12(L) -   Imaging studies:  CT Abdomen and Pelvis with IV Contrast (06/14/2017) - personally reviewed and discussed with patient and her daughter Small to moderate hiatal hernia. Stomach otherwise unremarkable. Duodenum is normally positioned as is the ligament of [place]. No small bowel wall thickening. No small bowel dilatation. The terminal ileum is normal. The appendix is normal. Diverticular changes are noted in the left colon. There is a persistent focus of probable small abscess adjacent to the colon on image 58 series 2 measuring 9 x 20 mm and too small to drain. This appears decreased when comparing to the prior study and is better demonstrated given the intravenous contrast used on today's study.  No adrenal nodule or mass. Subtle areas of segmental edema in the kidneys bilaterally are compatible with pyelonephritis. 2 mm nonobstructing stone is identified in the interpolar left kidney and another 2-3 mm nonobstructing stone is identified in the upper pole the left kidney. No evidence for hydroureter. Bladder is distended.  Assessment/Plan: (ICD-10's: K57.20, N10, E86.0) 83 y.o. female with altered mental status and lethargy/fatigue attributable to hypovolemia/dehydration following recent diarrhea associated with ongoing antibiotics for and complicated by subacute sigmoid colonic diverticulitis with small pericolonic abscess (too small to drain or to require drainage), pyelonephritis, neutropenia, and by pertinent comorbidities including HLD, osteoporosis, recurrent UTI's, GERD with small hiatal hernia, irritable  bowel syndrome, chronic low-dose steroids for temporal arteritis, generalized anxiety disorder, and major depression disorder.   - follow up morning WBC  - pain control prn (minimize narcotics)  - IV fluids and antibiotics (Zosyn started in ED)  - monitor cognitive status/function, abdominal exam, and bowel function   - medical management of comorbidities as per medical team   - will follow patient's progress along with primary team  - DVT prophylaxis, ambulation encouraged  All of the above findings and recommendations were discussed with the patient  and her daughter, and all of patient's and her family's questions were answered to their expressed satisfaction.  Thank you for the opportunity to participate in this patient's care.   -- Marilynne Drivers Rosana Hoes, MD, [place]: [facility] - Partnering for exceptional care. Office: [phone]

## 2017-06-14 NOTE — ED Notes (Signed)
MD Davis at bedside

## 2017-06-14 NOTE — Progress Notes (Signed)
ANTIBIOTIC CONSULT NOTE - INITIAL  Pharmacy Consult for Zosyn Indication: intra-abdominal infection  Allergies  Allergen Reactions  . Benzalkonium Chloride     Other reaction(s): Unknown  . Escitalopram     Deveioped a twitch  . Neomycin-Bacitracin Zn-Polymyx     Other reaction(s): Other (See Comments) REACTION: blisters skin REACTION: blisters skin    Patient Measurements: Height: 5\' 6"  (167.6 cm) Weight: 162 lb (73.5 kg) IBW/kg (Calculated) : 59.3 Adjusted Body Weight:   Vital Signs: Temp: 97.8 F (36.6 C) (01/17 1518) Temp Source: Oral (01/17 1518) BP: 147/53 (01/17 2000) Pulse Rate: 80 (01/17 2000) Intake/Output from previous day: No intake/output data recorded. Intake/Output from this shift: No intake/output data recorded.  Labs: Recent Labs    06/14/17 1702  WBC 2.5*  HGB 13.9  PLT 240  CREATININE 1.04*   Estimated Creatinine Clearance: 44.3 mL/min (A) (by C-G formula based on SCr of 1.04 mg/dL (H)). No results for input(s): VANCOTROUGH, VANCOPEAK, VANCORANDOM, GENTTROUGH, GENTPEAK, GENTRANDOM, TOBRATROUGH, TOBRAPEAK, TOBRARND, AMIKACINPEAK, AMIKACINTROU, AMIKACIN in the last 72 hours.   Microbiology: No results found for this or any previous visit (from the past 720 hour(s)).  Medical History: Past Medical History:  Diagnosis Date  . Breast cancer ([place])   . Depression   . Diverticulosis   . GERD (gastroesophageal reflux disease)   . Hiatal hernia   . History of Bell's palsy   . History of colon polyps   . History of nephrolithiasis   . History of pancreatitis   . Hyperlipidemia   . IBS (irritable bowel syndrome)   . Osteoporosis   . Temporal arteritis ([place])   . Tubular adenoma of colon     Medications:   (Not in a hospital admission) Scheduled:  . enoxaparin (LOVENOX) injection  40 mg Subcutaneous Q24H   Assessment: Pharmacy consulted to dose and monitor Zosyn in this 83 year old female being treated for intra-abdominal  infection.  Goal of Therapy:    Plan:  Will start Zosyn 3.375 g IV q8 hours.   Rainey Kahrs D 06/14/2017,8:19 PM

## 2017-06-14 NOTE — ED Notes (Signed)
Second urine specimen sent to the lab.

## 2017-06-14 NOTE — ED Provider Notes (Signed)
[facility] Emergency Department Provider Note  ____________________________________________  Time seen: Approximately 3:52 PM  I have reviewed the triage vital signs and the nursing notes.   HISTORY  Chief Complaint Weakness    HPI [name] is a 83 y.o. female who lives independently currently under treatment for diverticulitis presenting with confusion, continued left lower quadrant pain and shaking chills.  The patient reports that she underwent CT examination 2 weeks ago and has been under treatment with daily Cipro and Flagyl for the past 2 weeks.  Last week, she did have some associated loose stool and diarrhea, which was attributed to the antibiotic use and has completely resolved.  She is now having normal stools but continues to have occasional discomfort in the left lower quadrant.  She has not had any nausea or vomiting, dysuria, urinary frequency.  However, she has had confusion and is "mixing up my days" which is grossly abnormal for her.  Past Medical History:  Diagnosis Date  . Breast cancer ([place])   . Depression   . Diverticulosis   . GERD (gastroesophageal reflux disease)   . Hiatal hernia   . History of Bell's palsy   . History of colon polyps   . History of nephrolithiasis   . History of pancreatitis   . Hyperlipidemia   . IBS (irritable bowel syndrome)   . Osteoporosis   . Temporal arteritis ([place])   . Tubular adenoma of colon     Patient Active Problem List   Diagnosis Date Noted  . Atherosclerosis of native arteries of extremity with intermittent claudication ([place]) 08/01/2016  . PAD (peripheral artery disease) ([place]) 05/10/2016  . Pain in limb 05/02/2016  . Atrophic vaginitis 11/04/2014  . Cystocele, grade 2 11/04/2014  . Nephrolithiasis 11/04/2014  . H/O neoplasm 07/27/2014  . History of nonmelanoma skin cancer 07/27/2014  . Hyperlipidemia, unspecified 04/14/2014  . Osteoporosis 01/07/2014  . Osteoarthritis  01/07/2014  . Biliary tract disorder 01/07/2014  . Anosmia 03/19/2013  . PANCREATITIS 05/27/2008  . NAUSEA 05/27/2008  . DIARRHEA 05/27/2008  . ABDOMINAL PAIN-EPIGASTRIC 05/27/2008  . Temporal arteritis ([place]) 08/05/2007  . OSTEOPOROSIS 08/05/2007  . IRRITABLE BOWEL SYNDROME, HX OF 08/05/2007  . DIVERTICULOSIS, COLON 03/08/2007  . COLONIC POLYPS, ADENOMATOUS 10/28/1998  . GERD 10/28/1998  . HIATAL HERNIA 10/28/1998    Past Surgical History:  Procedure Laterality Date  . LAPAROSCOPIC CHOLECYSTECTOMY  2009  . MASTECTOMY  1983   bilateral    Current Outpatient Rx  . Order #: [id number] Class: Historical Med  . Order #: [id number] Class: Historical Med  . Order #: [id number] Class: Historical Med  . Order #: [id number] Class: Historical Med  . Order #: [id number] Class: Historical Med  . Order #: [id number] Class: Historical Med  . Order #: [id number] Class: Normal  . Order #: [id number] Class: Historical Med  . Order #: [id number] Class: Historical Med  . Order #: [id number] Class: Historical Med  . Order #: [id number] Class: Historical Med  . Order #: [id number] Class: Print  . Order #: [id number] Class: Historical Med  . Order #: [id number] Class: Historical Med    Allergies Benzalkonium chloride; Escitalopram; and Neomycin-bacitracin zn-polymyx  Family History  Problem Relation Age of Onset  . Colon cancer Mother 50  . Scleroderma Father   . Stomach cancer Neg Hx     Social History Social History   Tobacco Use  . Smoking status: Former Smoker    Last attempt to quit: 05/17/2005    Years since quitting: 12.0  . Smokeless tobacco: Never Used  .  Tobacco comment: quit 11 years ago  Substance Use Topics  . Alcohol use: No    Alcohol/week: 0.0 oz  . Drug use: No    Review of Systems Constitutional: Positive shaking chills and general malaise.  Positive altered mental status. Eyes: No visual changes.  No eye discharge. ENT: No sore throat. No congestion or rhinorrhea. Cardiovascular: Denies  chest pain. Denies palpitations. Respiratory: Denies shortness of breath.  No cough. Gastrointestinal: Positive left lower quadrant abdominal pain.  No nausea, no vomiting.  +diarrhea, now resolved..  No constipation. Genitourinary: Negative for dysuria.  No urinary frequency. Musculoskeletal: Negative for back pain. Skin: Negative for rash. Neurological: Negative for headaches. No focal numbness, tingling or weakness.     ____________________________________________   PHYSICAL EXAM:  VITAL SIGNS: ED Triage Vitals [06/14/17 1518]  Enc Vitals Group     BP (!) 143/76     Pulse Rate 93     Resp 19     Temp 97.8 F (36.6 C)     Temp Source Oral     SpO2 98 %     Weight 162 lb (73.5 kg)     Height 5\' 6"  (1.676 m)     Head Circumference      Peak Flow      Pain Score 2     Pain Loc      Pain Edu?      Excl. in [place]?     Constitutional: The patient is alert and oriented x3 but when I ask her questions about the last several days, she is "mixed up."  The patient is tearful but nontoxic.   Eyes: Conjunctivae are normal.  EOMI. No scleral icterus. Head: Atraumatic. Nose: No congestion/rhinnorhea. Mouth/Throat: Mucous membranes are mildly dry.  Neck: No stridor.  Supple.  No JVD.  No meningismus. Cardiovascular: Normal rate, regular rhythm. No murmurs, rubs or gallops.  Respiratory: Normal respiratory effort.  No accessory muscle use or retractions. Lungs CTAB.  No wheezes, rales or ronchi. Gastrointestinal: Soft, and nondistended.  Tenderness to palpation in the left lower quadrant.  No guarding or rebound.  No peritoneal signs. Musculoskeletal: No LE edema.  Neurologic:  A&Ox3.  Speech is clear.  Face and smile are symmetric.  EOMI.  Moves all extremities well. Skin:  Skin is warm, dry and intact. No rash noted. Psychiatric: The patient is tearful.  ____________________________________________   LABS (all labs ordered are listed, but only abnormal results are  displayed)  Labs Reviewed  CBC - Abnormal; Notable for the following components:      Result Value   WBC 2.5 (*)    RDW 15.0 (*)    All other components within normal limits  COMPREHENSIVE METABOLIC PANEL - Abnormal; Notable for the following components:   Glucose, Bld 132 (*)    Creatinine, Ser 1.04 (*)    Calcium 8.0 (*)    Total Protein 6.4 (*)    ALT 9 (*)    GFR calc non Af Amer 49 (*)    GFR calc Af Amer 57 (*)    All other components within normal limits  CULTURE, BLOOD (ROUTINE X 2)  CULTURE, BLOOD (ROUTINE X 2)  URINALYSIS, COMPLETE (UACMP) WITH MICROSCOPIC   ____________________________________________  EKG  ED ECG REPORT I, Eula Listen, the attending physician, personally viewed and interpreted this ECG.   Date: 06/14/2017  EKG Time: 1521  Rate: 76  Rhythm: normal sinus rhythm  Axis: normal  Intervals:none  ST&T Change: No STEMI  ____________________________________________  RADIOLOGY  Ct Abdomen Pelvis W Contrast  Result Date: 06/14/2017 CLINICAL DATA:  Generalized weakness.  History of diverticulitis. EXAM: CT ABDOMEN AND PELVIS WITH CONTRAST TECHNIQUE: Multidetector CT imaging of the abdomen and pelvis was performed using the standard protocol following bolus administration of intravenous contrast. CONTRAST:  59mL ISOVUE-370 IOPAMIDOL (ISOVUE-370) INJECTION 76% COMPARISON:  None. FINDINGS: Lower chest: Small to moderate hiatal hernia. Otherwise unremarkable. Hepatobiliary: No focal abnormality within the liver parenchyma. Gallbladder surgically absent. Mild distention of the common bile duct is within normal limits for patient age. Pancreas: No focal mass lesion. No dilatation of the main duct. No intraparenchymal cyst. No peripancreatic edema. Spleen: 9 mm subcapsular low-density lesion in the spleen is stable since prior study and likely reflects a tiny cyst or pseudocyst. Adrenals/Urinary Tract: No adrenal nodule or mass. Subtle areas of segmental  edema in the kidneys bilaterally are compatible with pyelonephritis. 2 mm nonobstructing stone is identified in the interpolar left kidney and another 2-3 mm nonobstructing stone is identified in the upper pole the left kidney. No evidence for hydroureter. Bladder is distended. Stomach/Bowel: Small to moderate hiatal hernia. Stomach otherwise unremarkable. Duodenum is normally positioned as is the ligament of Treitz. No small bowel wall thickening. No small bowel dilatation. The terminal ileum is normal. The appendix is normal. Diverticular changes are noted in the left colon. There is a persistent focus of probable small abscess adjacent to the colon on image 58 series 2 measuring 9 x 20 mm and too small to drain. This appears decreased when comparing to the prior study and is better demonstrated given the intravenous contrast used on today's study. Vascular/Lymphatic: There is abdominal aortic atherosclerosis without aneurysm. There is no gastrohepatic or hepatoduodenal ligament lymphadenopathy. No intraperitoneal or retroperitoneal lymphadenopathy. No pelvic sidewall lymphadenopathy. Reproductive: The uterus has normal CT imaging appearance. There is no adnexal mass. Other: No intraperitoneal free fluid. Musculoskeletal: Bone windows reveal no worrisome lytic or sclerotic osseous lesions. IMPRESSION: 1. Areas of segmental edema in both kidneys compatible with pyelonephritis. No evidence for intrarenal abscess. 2. Persistent edema/inflammation associated with the sigmoid colon superimposed on a background of diverticulosis. Imaging features remain compatible with diverticulitis and there is a small para colonic abscess (9 x 20 mm, too small to percutaneously drain), better demonstrated on today's study with intravenous contrast material. 3. Aortic Atherosclerois (ICD10-170.0) 4. Small to moderate hiatal hernia. Electronically Signed   By: Misty Stanley M.D.   On: 06/14/2017 18:24     ____________________________________________   PROCEDURES  Procedure(s) performed: None  Procedures  Critical Care performed: No ____________________________________________   INITIAL IMPRESSION / ASSESSMENT AND PLAN / ED COURSE  Pertinent labs & imaging results that were available during my care of the patient were reviewed by me and considered in my medical decision making (see chart for details).  83 y.o. female currently under treatment with Cipro and vaginal for diverticulitis presenting with ongoing left lower quadrant pain, now with confusion.  Overall, the patient is hemodynamically stable and afebrile.  She does continue to have left lower quadrant tenderness on my examination and I suspect she has undertreated diverticulitis.  We will get a CT scan to evaluate for any complication including abscess or perforation.  UTI, especially in the setting of recent diarrheal illness, is also on my differential for her confusion.  We will get a screening EKG.  She will receive intravenous fluids for her dehydration, and fentanyl for pain control.  Plan admission for continued treatment.  ----------------------------------------- 6:32 PM  on 06/14/2017 -----------------------------------------  Patient has continued to be hemodynamically stable.  She does have a low white blood cell count of 2.5, which is likely an infectious reaction.  Her electrolytes are reassuring and she is mildly renally insufficient, likely due to dehydration.  Her CT scan does show inflammation that would be consistent with pyelonephritis, as well as a small pericolonic abscess that is 9 x 22 mm, too small to percutaneously drain.  I spoken with Dr. Burt Knack, the general surgeon on call, regarding her abscess and he will come consult on the patient.  I will plan to admit the patient to the hospitalist for continued antibiotics and serial examinations.   ____________________________________________  FINAL  CLINICAL IMPRESSION(S) / ED DIAGNOSES  Final diagnoses:  Diverticulitis of large intestine with complication  Abscess of sigmoid colon  Pyelonephritis  Altered mental status, unspecified altered mental status type         NEW MEDICATIONS STARTED DURING THIS VISIT:  New Prescriptions   No medications on file      Eula Listen, MD 06/14/17 1911

## 2017-06-14 NOTE — ED Notes (Signed)
Pt is NAD at this time skin is warm and dry, resting in bed. Pt is tearful stating, "I waited too long to go to doctor." Pt is awaiting EDP

## 2017-06-14 NOTE — H&P (Signed)
[facility] [place] NAME: [name]    MR#:  [id number]  DATE OF BIRTH:  April 28, 1935  DATE OF ADMISSION:  06/14/2017  PRIMARY CARE PHYSICIAN: Baxter Hire, MD   REQUESTING/REFERRING PHYSICIAN: Eula Listen, MD  CHIEF COMPLAINT:   Chief Complaint  Patient presents with  . Weakness    HISTORY OF PRESENT ILLNESS: [name]  is a 83 y.o. female with a known history of breast cancer, GERD, hyperlipidemia, history of temporal arteritis presenting with complaining of weakness and some confusion.  Patient stated that she started having some left lower quadrant abdominal pain few weeks ago and 2 weeks ago she was diagnosed with diverticulitis by her primary care provider.  She was started on oral Cipro and Flagyl.  She subsequently developed diarrhea for a few days.  Then she continued to take the medication the diarrhea has resolved now she has semisolid stools however she continued to have abdominal discomfort.  She states that she was also was feeling like she was not getting her days straight.  Patient comes to the ER and had a CT of the abdomen shows diverticulitis with small abscess.  The ED physician discussed the case with the surgeon who stated that patient needed to be admitted to the medicine service with IV antibiotics.  Patient denies any fevers or chills.  Denies any urinary frequency or urgency.       PAST MEDICAL HISTORY:   Past Medical History:  Diagnosis Date  . Breast cancer ([place])   . Depression   . Diverticulosis   . GERD (gastroesophageal reflux disease)   . Hiatal hernia   . History of Bell's palsy   . History of colon polyps   . History of nephrolithiasis   . History of pancreatitis   . Hyperlipidemia   . IBS (irritable bowel syndrome)   . Osteoporosis   . Temporal arteritis ([place])   . Tubular adenoma of colon     PAST SURGICAL HISTORY:  Past Surgical History:  Procedure Laterality Date  .  LAPAROSCOPIC CHOLECYSTECTOMY  2009  . MASTECTOMY  1983   bilateral    SOCIAL HISTORY:  Social History   Tobacco Use  . Smoking status: Former Smoker    Last attempt to quit: 05/17/2005    Years since quitting: 12.0  . Smokeless tobacco: Never Used  . Tobacco comment: quit 11 years ago  Substance Use Topics  . Alcohol use: No    Alcohol/week: 0.0 oz    FAMILY HISTORY:  Family History  Problem Relation Age of Onset  . Colon cancer Mother 34  . Scleroderma Father   . Stomach cancer Neg Hx     DRUG ALLERGIES:  Allergies  Allergen Reactions  . Benzalkonium Chloride     Other reaction(s): Unknown  . Escitalopram     Deveioped a twitch  . Neomycin-Bacitracin Zn-Polymyx     Other reaction(s): Other (See Comments) REACTION: blisters skin REACTION: blisters skin    REVIEW OF SYSTEMS:   CONSTITUTIONAL: No fever, fatigue or weakness.  EYES: No blurred or double vision.  EARS, NOSE, AND THROAT: No tinnitus or ear pain.  RESPIRATORY: No cough, shortness of breath, wheezing or hemoptysis.  CARDIOVASCULAR: No chest pain, orthopnea, edema.  GASTROINTESTINAL: No nausea, vomiting, diarrhea or positive abdominal pain.  GENITOURINARY: No dysuria, hematuria.  ENDOCRINE: No polyuria, nocturia,  HEMATOLOGY: No anemia, easy bruising or bleeding SKIN: No rash or lesion. MUSCULOSKELETAL: No joint pain or arthritis.  NEUROLOGIC: No tingling, numbness, weakness.  PSYCHIATRY: No anxiety or depression.   MEDICATIONS AT HOME:  Prior to Admission medications   Medication Sig Start Date End Date Taking? Authorizing Provider  ALPRAZolam (XANAX) 0.25 MG tablet Take 0.25 mg by mouth 2 (two) times daily as needed.  11/03/10  Yes [provider]  aspirin EC 81 MG tablet Take 81 mg daily by mouth.   Yes [provider]  Cholecalciferol (VITAMIN D3) 2000 units capsule Take 2,000 Units daily by mouth.    Yes [provider]  ciprofloxacin (CIPRO) 500 MG tablet Take 500  mg by mouth 2 (two) times daily. 06/01/17  Yes [provider]  Cyanocobalamin (B-12 PO) Take 2,500 mcg daily by mouth.    Yes [provider]  dicyclomine (BENTYL) 20 MG tablet Take 1 tablet (20 mg total) by mouth 3 (three) times daily before meals. 10/20/14  Yes Lafayette Dragon, MD  estradiol (ESTRACE) 1 MG tablet Take 1 mg daily by mouth.  10/11/16  Yes [provider]  metroNIDAZOLE (FLAGYL) 500 MG tablet Take 500 mg by mouth 3 (three) times daily. 06/01/17  Yes [provider]  predniSONE (DELTASONE) 5 MG tablet Take 10 mg daily by mouth.    Yes [provider]  traMADol (ULTRAM) 50 MG tablet Take 50 mg 2 (two) times daily by mouth. One tablet by mouth twice daily  11/21/10  Yes [provider]  ondansetron (ZOFRAN ODT) 4 MG disintegrating tablet Take 1 tablet (4 mg total) every 8 (eight) hours as needed by mouth for nausea or vomiting. Patient not taking: Reported on 06/14/2017 04/16/17   Earleen Newport, MD      PHYSICAL EXAMINATION:   VITAL SIGNS: Blood pressure (!) 141/69, pulse 74, temperature 97.8 F (36.6 C), temperature source Oral, resp. rate 19, height 5\' 6"  (1.676 m), weight 162 lb (73.5 kg), SpO2 98 %.  GENERAL:  83 y.o.-year-old patient lying in the bed with no acute distress.  EYES: Pupils equal, round, reactive to light and accommodation. No scleral icterus. Extraocular muscles intact.  HEENT: Head atraumatic, normocephalic. Oropharynx and nasopharynx clear.  NECK:  Supple, no jugular venous distention. No thyroid enlargement, no tenderness.  LUNGS: Normal breath sounds bilaterally, no wheezing, rales,rhonchi or crepitation. No use of accessory muscles of respiration.  CARDIOVASCULAR: S1, S2 normal. No murmurs, rubs, or gallops.  ABDOMEN: Soft, left lower quadrant tenderness nondistended. Bowel sounds present. No organomegaly or mass.  EXTREMITIES: No pedal edema, cyanosis, or clubbing.  NEUROLOGIC: Cranial nerves II  through XII are intact. Muscle strength 5/5 in all extremities. Sensation intact. Gait not checked.  PSYCHIATRIC: The patient is alert and oriented x 3.  SKIN: No obvious rash, lesion, or ulcer.   LABORATORY PANEL:   CBC Recent Labs  Lab 06/14/17 1702  WBC 2.5*  HGB 13.9  HCT 42.2  PLT 240  MCV 90.1  MCH 29.8  MCHC 33.0  RDW 15.0*   ------------------------------------------------------------------------------------------------------------------  Chemistries  Recent Labs  Lab 06/14/17 1702  NA 138  K 3.9  CL 104  CO2 24  GLUCOSE 132*  BUN 7  CREATININE 1.04*  CALCIUM 8.0*  AST 23  ALT 9*  ALKPHOS 59  BILITOT 0.8   ------------------------------------------------------------------------------------------------------------------ estimated creatinine clearance is 44.3 mL/min (A) (by C-G formula based on SCr of 1.04 mg/dL (H)). ------------------------------------------------------------------------------------------------------------------ No results for input(s): TSH, T4TOTAL, T3FREE, THYROIDAB in the last 72 hours.  Invalid input(s): FREET3   Coagulation profile No results for input(s):  INR, PROTIME in the last 168 hours. ------------------------------------------------------------------------------------------------------------------- No results for input(s): DDIMER in the last 72 hours. -------------------------------------------------------------------------------------------------------------------  Cardiac Enzymes No results for input(s): CKMB, TROPONINI, MYOGLOBIN in the last 168 hours.  Invalid input(s): CK ------------------------------------------------------------------------------------------------------------------ Invalid input(s): POCBNP  ---------------------------------------------------------------------------------------------------------------  Urinalysis    Component Value Date/Time   COLORURINE YELLOW (A) 06/14/2017 1702    APPEARANCEUR HAZY (A) 06/14/2017 1702   APPEARANCEUR Clear 11/04/2014 0845   LABSPEC 1.023 06/14/2017 1702   LABSPEC 1.025 08/11/2012 1358   PHURINE 6.0 06/14/2017 1702   GLUCOSEU NEGATIVE 06/14/2017 1702   GLUCOSEU Negative 08/11/2012 1358   HGBUR NEGATIVE 06/14/2017 1702   BILIRUBINUR NEGATIVE 06/14/2017 1702   BILIRUBINUR Negative 11/04/2014 0845   BILIRUBINUR Negative 08/11/2012 1358   KETONESUR NEGATIVE 06/14/2017 1702   PROTEINUR NEGATIVE 06/14/2017 1702   UROBILINOGEN 1.0 08/30/2007 2131   NITRITE NEGATIVE 06/14/2017 1702   LEUKOCYTESUR SMALL (A) 06/14/2017 1702   LEUKOCYTESUR 2+ (A) 11/04/2014 0845   LEUKOCYTESUR Negative 08/11/2012 1358     RADIOLOGY: Ct Abdomen Pelvis W Contrast  Result Date: 06/14/2017 CLINICAL DATA:  Generalized weakness.  History of diverticulitis. EXAM: CT ABDOMEN AND PELVIS WITH CONTRAST TECHNIQUE: Multidetector CT imaging of the abdomen and pelvis was performed using the standard protocol following bolus administration of intravenous contrast. CONTRAST:  59mL ISOVUE-370 IOPAMIDOL (ISOVUE-370) INJECTION 76% COMPARISON:  None. FINDINGS: Lower chest: Small to moderate hiatal hernia. Otherwise unremarkable. Hepatobiliary: No focal abnormality within the liver parenchyma. Gallbladder surgically absent. Mild distention of the common bile duct is within normal limits for patient age. Pancreas: No focal mass lesion. No dilatation of the main duct. No intraparenchymal cyst. No peripancreatic edema. Spleen: 9 mm subcapsular low-density lesion in the spleen is stable since prior study and likely reflects a tiny cyst or pseudocyst. Adrenals/Urinary Tract: No adrenal nodule or mass. Subtle areas of segmental edema in the kidneys bilaterally are compatible with pyelonephritis. 2 mm nonobstructing stone is identified in the interpolar left kidney and another 2-3 mm nonobstructing stone is identified in the upper pole the left kidney. No evidence for hydroureter. Bladder is  distended. Stomach/Bowel: Small to moderate hiatal hernia. Stomach otherwise unremarkable. Duodenum is normally positioned as is the ligament of Treitz. No small bowel wall thickening. No small bowel dilatation. The terminal ileum is normal. The appendix is normal. Diverticular changes are noted in the left colon. There is a persistent focus of probable small abscess adjacent to the colon on image 58 series 2 measuring 9 x 20 mm and too small to drain. This appears decreased when comparing to the prior study and is better demonstrated given the intravenous contrast used on today's study. Vascular/Lymphatic: There is abdominal aortic atherosclerosis without aneurysm. There is no gastrohepatic or hepatoduodenal ligament lymphadenopathy. No intraperitoneal or retroperitoneal lymphadenopathy. No pelvic sidewall lymphadenopathy. Reproductive: The uterus has normal CT imaging appearance. There is no adnexal mass. Other: No intraperitoneal free fluid. Musculoskeletal: Bone windows reveal no worrisome lytic or sclerotic osseous lesions. IMPRESSION: 1. Areas of segmental edema in both kidneys compatible with pyelonephritis. No evidence for intrarenal abscess. 2. Persistent edema/inflammation associated with the sigmoid colon superimposed on a background of diverticulosis. Imaging features remain compatible with diverticulitis and there is a small para colonic abscess (9 x 20 mm, too small to percutaneously drain), better demonstrated on today's study with intravenous contrast material. 3. Aortic Atherosclerois (ICD10-170.0) 4. Small to moderate hiatal hernia. Electronically Signed   By: Misty Stanley M.D.   On: 06/14/2017 18:24    EKG:  Orders placed or performed during the hospital encounter of 06/14/17  . ED EKG  . ED EKG    IMPRESSION AND PLAN: Patient is a 83 year old white female with history of diverticulitis presenting with worsening abdominal pain  1.  Diverticulitis with small abscesses She has failed  outpatient antibiotic therapy I will place patient on Zosyn Surgery will see the patient We will place her on full liquid diet Give her IV fluids  2.  History of temporal arthritis Continue prednisone  3.  Generalized anxiety disorder continue alprazolam as needed  4.  Abnormal renal CT with evidence of possible pyelonephritis however urine is not that impressive I will send urine cultures  5.  Miscellaneous Lovenox for DVT prophylaxis   All the records are reviewed and case discussed with ED provider. Management plans discussed with the patient, family and they are in agreement.  CODE STATUS:    Code Status Orders  (From admission, onward)        Start     Ordered   06/14/17 1937  Full code  Continuous     06/14/17 1936    Code Status History    Date Active Date Inactive Code Status Order ID Comments User Context   This patient has a current code status but no historical code status.       TOTAL TIME TAKING CARE OF THIS PATIENT: 55 minutes.    Dustin Flock M.D on 06/14/2017 at 7:41 PM  Between 7am to 6pm - Pager - [phone]  After 6pm go to [email or web address] - password [email or web address] [facility]  Office  [phone]  CC: Primary care physician; Baxter Hire, MD

## 2017-06-15 DIAGNOSIS — K5720 Diverticulitis of large intestine with perforation and abscess without bleeding: Secondary | ICD-10-CM

## 2017-06-15 DIAGNOSIS — N12 Tubulo-interstitial nephritis, not specified as acute or chronic: Secondary | ICD-10-CM

## 2017-06-15 DIAGNOSIS — K630 Abscess of intestine: Secondary | ICD-10-CM

## 2017-06-15 DIAGNOSIS — K5792 Diverticulitis of intestine, part unspecified, without perforation or abscess without bleeding: Secondary | ICD-10-CM

## 2017-06-15 DIAGNOSIS — E860 Dehydration: Secondary | ICD-10-CM

## 2017-06-15 LAB — BASIC METABOLIC PANEL
Anion gap: 10 (ref 5–15)
BUN: 5 mg/dL — ABNORMAL LOW (ref 6–20)
CALCIUM: 7.3 mg/dL — AB (ref 8.9–10.3)
CO2: 21 mmol/L — ABNORMAL LOW (ref 22–32)
Chloride: 109 mmol/L (ref 101–111)
Creatinine, Ser: 0.86 mg/dL (ref 0.44–1.00)
GFR calc Af Amer: >60 mL/min (ref >60)
GLUCOSE: 82 mg/dL (ref 65–99)
Potassium: 2.9 mmol/L — ABNORMAL LOW (ref 3.5–5.1)
Sodium: 140 mmol/L (ref 135–145)

## 2017-06-15 LAB — CBC
HCT: 37.9 % (ref 35.0–47.0)
Hemoglobin: 12.4 g/dL (ref 12.0–16.0)
MCH: 29.2 pg (ref 26.0–34.0)
MCHC: 32.6 g/dL (ref 32.0–36.0)
MCV: 89.6 fL (ref 80.0–100.0)
PLATELETS: 185 10*3/uL (ref 150–440)
RBC: 4.23 MIL/uL (ref 3.80–5.20)
RDW: 14.8 % — ABNORMAL HIGH (ref 11.5–14.5)
WBC: 2 10*3/uL — AB (ref 3.6–11.0)

## 2017-06-15 LAB — MAGNESIUM: Magnesium: 1.8 mg/dL (ref 1.7–2.4)

## 2017-06-15 MED ORDER — ENSURE ENLIVE PO LIQD
237.0000 mL | Freq: Two times a day (BID) | ORAL | Status: DC
  Administered 2017-06-15 – 2017-06-16 (×2): 237 mL via ORAL

## 2017-06-15 MED ORDER — POTASSIUM CHLORIDE CRYS ER 20 MEQ PO TBCR
20.0000 meq | EXTENDED_RELEASE_TABLET | Freq: Two times a day (BID) | ORAL | Status: AC
  Administered 2017-06-15 – 2017-06-16 (×3): 20 meq via ORAL
  Filled 2017-06-15 (×3): qty 1

## 2017-06-15 NOTE — Progress Notes (Signed)
CC: Left lower quadrant abdominal pain Subjective: Patient is admitted with a tiny abscess and diverticulitis.  She was dehydrated yesterday and feels much better today she is mentating better and actually feels so much better with much less pain if any.  No nausea vomiting no fevers or chills  Objective: Vital signs in last 24 hours: Temp:  [97.6 F (36.4 C)-98.3 F (36.8 C)] 98.3 F (36.8 C) (01/18 1158) Pulse Rate:  [74-83] 80 (01/18 1158) Resp:  [16-32] 20 (01/18 1158) BP: (131-149)/(43-83) 136/53 (01/18 1158) SpO2:  [98 %-100 %] 99 % (01/18 1158) Last BM Date: 06/15/17  Intake/Output from previous day: 01/17 0701 - 01/18 0700 In: 743 [I.V.:743] Out: 400 [Urine:400] Intake/Output this shift: Total I/O In: 50 [IV Piggyback:50] Out: 700 [Urine:700]  Physical exam:  Awake alert and oriented vital signs are stable and reviewed afebrile abdomen is soft minimally tender in the left lower quadrant without guarding rebound or percussion tenderness but minimal edema to the calves  Lab Results: CBC  Recent Labs    06/14/17 1702 06/15/17 0442  WBC 2.5* 2.0*  HGB 13.9 12.4  HCT 42.2 37.9  PLT 240 185   BMET Recent Labs    06/14/17 1702 06/15/17 0442  NA 138 140  K 3.9 2.9*  CL 104 109  CO2 24 21*  GLUCOSE 132* 82  BUN 7 5*  CREATININE 1.04* 0.86  CALCIUM 8.0* 7.3*   PT/INR No results for input(s): LABPROT, INR in the last 72 hours. ABG No results for input(s): PHART, HCO3 in the last 72 hours.  Invalid input(s): PCO2, PO2  Studies/Results: Ct Abdomen Pelvis W Contrast  Result Date: 06/14/2017 CLINICAL DATA:  Generalized weakness.  History of diverticulitis. EXAM: CT ABDOMEN AND PELVIS WITH CONTRAST TECHNIQUE: Multidetector CT imaging of the abdomen and pelvis was performed using the standard protocol following bolus administration of intravenous contrast. CONTRAST:  7mL ISOVUE-370 IOPAMIDOL (ISOVUE-370) INJECTION 76% COMPARISON:  None. FINDINGS: Lower chest:  Small to moderate hiatal hernia. Otherwise unremarkable. Hepatobiliary: No focal abnormality within the liver parenchyma. Gallbladder surgically absent. Mild distention of the common bile duct is within normal limits for patient age. Pancreas: No focal mass lesion. No dilatation of the main duct. No intraparenchymal cyst. No peripancreatic edema. Spleen: 9 mm subcapsular low-density lesion in the spleen is stable since prior study and likely reflects a tiny cyst or pseudocyst. Adrenals/Urinary Tract: No adrenal nodule or mass. Subtle areas of segmental edema in the kidneys bilaterally are compatible with pyelonephritis. 2 mm nonobstructing stone is identified in the interpolar left kidney and another 2-3 mm nonobstructing stone is identified in the upper pole the left kidney. No evidence for hydroureter. Bladder is distended. Stomach/Bowel: Small to moderate hiatal hernia. Stomach otherwise unremarkable. Duodenum is normally positioned as is the ligament of Treitz. No small bowel wall thickening. No small bowel dilatation. The terminal ileum is normal. The appendix is normal. Diverticular changes are noted in the left colon. There is a persistent focus of probable small abscess adjacent to the colon on image 58 series 2 measuring 9 x 20 mm and too small to drain. This appears decreased when comparing to the prior study and is better demonstrated given the intravenous contrast used on today's study. Vascular/Lymphatic: There is abdominal aortic atherosclerosis without aneurysm. There is no gastrohepatic or hepatoduodenal ligament lymphadenopathy. No intraperitoneal or retroperitoneal lymphadenopathy. No pelvic sidewall lymphadenopathy. Reproductive: The uterus has normal CT imaging appearance. There is no adnexal mass. Other: No intraperitoneal free fluid. Musculoskeletal: Bone  windows reveal no worrisome lytic or sclerotic osseous lesions. IMPRESSION: 1. Areas of segmental edema in both kidneys compatible with  pyelonephritis. No evidence for intrarenal abscess. 2. Persistent edema/inflammation associated with the sigmoid colon superimposed on a background of diverticulosis. Imaging features remain compatible with diverticulitis and there is a small para colonic abscess (9 x 20 mm, too small to percutaneously drain), better demonstrated on today's study with intravenous contrast material. 3. Aortic Atherosclerois (ICD10-170.0) 4. Small to moderate hiatal hernia. Electronically Signed   By: Misty Stanley M.D.   On: 06/14/2017 18:24    Anti-infectives: Anti-infectives (From admission, onward)   Start     Dose/Rate Route Frequency Ordered Stop   06/15/17 0200  piperacillin-tazobactam (ZOSYN) IVPB 3.375 g     3.375 g 12.5 mL/hr over 240 Minutes Intravenous Every 8 hours 06/14/17 2018     06/14/17 1600  piperacillin-tazobactam (ZOSYN) IVPB 3.375 g     3.375 g 100 mL/hr over 30 Minutes Intravenous  Once 06/14/17 1552 06/14/17 1708      Assessment/Plan:  White blood cell count is remaining quite low at 2.0.  Studies were reviewed and discussed with Dr. Rosana Hoes.  No sign of the need for surgical intervention at this time would recommend continuing IV antibiotics and then ultimately sent home on oral antibiotics to be followed up in clinic.  Will follow while in the hospital.  Florene Glen, MD, FACS  06/15/2017

## 2017-06-15 NOTE — Progress Notes (Signed)
[facility] [place] NAME: Emmalee Solivan    MR#:  [id number]  DATE OF BIRTH:  [birth year]  SUBJECTIVE:   Patient to due to weakness and noted to have sigmoid diverticulitis on CT abdomen with a small abscess. Patient denies any abdominal pain, nausea, vomiting, fever, chills today. Tolerating a full liquid diet well. Patient feels overall weak still.  REVIEW OF SYSTEMS:    Review of Systems  Constitutional: Negative for chills and fever.  HENT: Negative for congestion and tinnitus.   Eyes: Negative for blurred vision and double vision.  Respiratory: Negative for cough, shortness of breath and wheezing.   Cardiovascular: Negative for chest pain, orthopnea and PND.  Gastrointestinal: Negative for abdominal pain, diarrhea, nausea and vomiting.  Genitourinary: Negative for dysuria and hematuria.  Neurological: Positive for weakness. Negative for dizziness, sensory change and focal weakness.  All other systems reviewed and are negative.   Nutrition: Full liquid Tolerating Diet: Yes Tolerating PT: Ambulatory   DRUG ALLERGIES:   Allergies  Allergen Reactions  . Benzalkonium Chloride     Other reaction(s): Unknown  . Escitalopram     Deveioped a twitch  . Neomycin-Bacitracin Zn-Polymyx     Other reaction(s): Other (See Comments) REACTION: blisters skin REACTION: blisters skin    VITALS:  Blood pressure (!) 136/53, pulse 80, temperature 98.3 F (36.8 C), temperature source Oral, resp. rate 20, height 5\' 6"  (1.676 m), weight 73.5 kg (162 lb), SpO2 99 %.  PHYSICAL EXAMINATION:   Physical Exam  GENERAL:  83 y.o.-year-old patient lying in bed in no acute distress.  EYES: Pupils equal, round, reactive to light and accommodation. No scleral icterus. Extraocular muscles intact.  HEENT: Head atraumatic, normocephalic. Oropharynx and nasopharynx clear.  NECK:  Supple, no jugular venous distention. No thyroid enlargement, no tenderness.   LUNGS: Normal breath sounds bilaterally, no wheezing, rales, rhonchi. No use of accessory muscles of respiration.  CARDIOVASCULAR: S1, S2 normal. No murmurs, rubs, or gallops.  ABDOMEN: Soft, mild left lower quadrant tenderness, no rebound, rigidity, nondistended. Bowel sounds present. No organomegaly or mass.  EXTREMITIES: No cyanosis, clubbing or edema b/l.    NEUROLOGIC: Cranial nerves II through XII are intact. No focal Motor or sensory deficits b/l.  PSYCHIATRIC: The patient is alert and oriented x 3.  SKIN: No obvious rash, lesion, or ulcer.    LABORATORY PANEL:   CBC Recent Labs  Lab 06/15/17 0442  WBC 2.0*  HGB 12.4  HCT 37.9  PLT 185   ------------------------------------------------------------------------------------------------------------------  Chemistries  Recent Labs  Lab 06/14/17 1702 06/15/17 0442  NA 138 140  K 3.9 2.9*  CL 104 109  CO2 24 21*  GLUCOSE 132* 82  BUN 7 5*  CREATININE 1.04* 0.86  CALCIUM 8.0* 7.3*  MG  --  1.8  AST 23  --   ALT 9*  --   ALKPHOS 59  --   BILITOT 0.8  --    ------------------------------------------------------------------------------------------------------------------  Cardiac Enzymes No results for input(s): TROPONINI in the last 168 hours. ------------------------------------------------------------------------------------------------------------------  RADIOLOGY:  Ct Abdomen Pelvis W Contrast  Result Date: 06/14/2017 CLINICAL DATA:  Generalized weakness.  History of diverticulitis. EXAM: CT ABDOMEN AND PELVIS WITH CONTRAST TECHNIQUE: Multidetector CT imaging of the abdomen and pelvis was performed using the standard protocol following bolus administration of intravenous contrast. CONTRAST:  79mL ISOVUE-370 IOPAMIDOL (ISOVUE-370) INJECTION 76% COMPARISON:  None. FINDINGS: Lower chest: Small to moderate hiatal hernia. Otherwise unremarkable. Hepatobiliary: No focal abnormality within  the liver parenchyma.  Gallbladder surgically absent. Mild distention of the common bile duct is within normal limits for patient age. Pancreas: No focal mass lesion. No dilatation of the main duct. No intraparenchymal cyst. No peripancreatic edema. Spleen: 9 mm subcapsular low-density lesion in the spleen is stable since prior study and likely reflects a tiny cyst or pseudocyst. Adrenals/Urinary Tract: No adrenal nodule or mass. Subtle areas of segmental edema in the kidneys bilaterally are compatible with pyelonephritis. 2 mm nonobstructing stone is identified in the interpolar left kidney and another 2-3 mm nonobstructing stone is identified in the upper pole the left kidney. No evidence for hydroureter. Bladder is distended. Stomach/Bowel: Small to moderate hiatal hernia. Stomach otherwise unremarkable. Duodenum is normally positioned as is the ligament of Treitz. No small bowel wall thickening. No small bowel dilatation. The terminal ileum is normal. The appendix is normal. Diverticular changes are noted in the left colon. There is a persistent focus of probable small abscess adjacent to the colon on image 58 series 2 measuring 9 x 20 mm and too small to drain. This appears decreased when comparing to the prior study and is better demonstrated given the intravenous contrast used on today's study. Vascular/Lymphatic: There is abdominal aortic atherosclerosis without aneurysm. There is no gastrohepatic or hepatoduodenal ligament lymphadenopathy. No intraperitoneal or retroperitoneal lymphadenopathy. No pelvic sidewall lymphadenopathy. Reproductive: The uterus has normal CT imaging appearance. There is no adnexal mass. Other: No intraperitoneal free fluid. Musculoskeletal: Bone windows reveal no worrisome lytic or sclerotic osseous lesions. IMPRESSION: 1. Areas of segmental edema in both kidneys compatible with pyelonephritis. No evidence for intrarenal abscess. 2. Persistent edema/inflammation associated with the sigmoid colon  superimposed on a background of diverticulosis. Imaging features remain compatible with diverticulitis and there is a small para colonic abscess (9 x 20 mm, too small to percutaneously drain), better demonstrated on today's study with intravenous contrast material. 3. Aortic Atherosclerois (ICD10-170.0) 4. Small to moderate hiatal hernia. Electronically Signed   By: Misty Stanley M.D.   On: 06/14/2017 18:24     ASSESSMENT AND PLAN:   82 year old female with past medical history of irritable bowel syndrome, osteoporosis, hyperlipidemia, GERD, history of breast cancer, history of nephrolithiasis who presented to the hospital due to weakness and abdominal pain and noted to have CT scan findings suggestive of acute diverticulitis.  1. Acute diverticulitis-patient on CT abdomen pelvis on admission noted to have a sigmoid diverticulitis and also a small para colonic abscess. -Continue IV antibiotics with Zosyn. The abscess is too small to drain percutaneously. Continue supportive care. -appreciate surgical input and no acute surgical indication presently. Continue supportive care as mentioned above with fluids, antibiotics and pain medications. -Patient tolerating a full liquid diet and currently afebrile and hemodynamically stable.  2. History of temporal arteritis-continue maintenance prednisone.  3. History of IBS-continue Bentyl.  4. Anxiety-continue Xanax as needed.  5. Hypokalemia-secondary to the underlying diarrhea from sigmoid diverticulitis. We'll continue supplemental repeat level in the morning.  Mg. Level normal.     All the records are reviewed and case discussed with Care Management/Social Worker. Management plans discussed with the patient, family and they are in agreement.  CODE STATUS: Full code  DVT Prophylaxis: Lovenox  TOTAL TIME TAKING CARE OF THIS PATIENT: 30 minutes.   POSSIBLE D/C IN 1-2 DAYS, DEPENDING ON CLINICAL CONDITION.   Henreitta Leber M.D on 06/15/2017 at  2:52 PM  Between 7am to 6pm - Pager - [phone]  After 6pm go to [email or web address] - password  [facility]  [facility] [place] [facility]  Office  [phone]  CC: Primary care physician; Baxter Hire, MD

## 2017-06-15 NOTE — Progress Notes (Signed)
Dr. Verdell Carmine notified K 2.9. See new orders.

## 2017-06-15 NOTE — Progress Notes (Signed)
Pharmacy Antibiotic Note  [name] is a 83 y.o. female admitted on 06/14/2017 with diverticulitis with abscesses.  Pharmacy has been consulted for Zosyn dosing.  Plan: Will contiue Zosyn 3.375g IV q8h (4 hour infusion).  Height: 5\' 6"  (167.6 cm) Weight: 162 lb (73.5 kg) IBW/kg (Calculated) : 59.3  Temp (24hrs), Avg:97.9 F (36.6 C), Min:97.6 F (36.4 C), Max:98.3 F (36.8 C)  Recent Labs  Lab 06/14/17 1702 06/15/17 0442  WBC 2.5* 2.0*  CREATININE 1.04* 0.86    Estimated Creatinine Clearance: 53.5 mL/min (by C-G formula based on SCr of 0.86 mg/dL).    Allergies  Allergen Reactions  . Benzalkonium Chloride     Other reaction(s): Unknown  . Escitalopram     Deveioped a twitch  . Neomycin-Bacitracin Zn-Polymyx     Other reaction(s): Other (See Comments) REACTION: blisters skin REACTION: blisters skin    Antimicrobials this admission: Zosyn 1/17 >>   Dose adjustments this admission:  Microbiology results: 1/17 BCx: NGTD  Thank you for allowing pharmacy to be a part of this patient's care.  Ulice Dash D 06/15/2017 1:33 PM

## 2017-06-16 DIAGNOSIS — K5732 Diverticulitis of large intestine without perforation or abscess without bleeding: Principal | ICD-10-CM

## 2017-06-16 LAB — CBC
HEMATOCRIT: 37.4 % (ref 35.0–47.0)
Hemoglobin: 12.1 g/dL (ref 12.0–16.0)
MCH: 29 pg (ref 26.0–34.0)
MCHC: 32.4 g/dL (ref 32.0–36.0)
MCV: 89.6 fL (ref 80.0–100.0)
PLATELETS: 196 10*3/uL (ref 150–440)
RBC: 4.18 MIL/uL (ref 3.80–5.20)
RDW: 15 % — ABNORMAL HIGH (ref 11.5–14.5)
WBC: 2.3 10*3/uL — AB (ref 3.6–11.0)

## 2017-06-16 LAB — BASIC METABOLIC PANEL
Anion gap: 7 (ref 5–15)
BUN: 5 mg/dL — ABNORMAL LOW (ref 6–20)
CALCIUM: 7.7 mg/dL — AB (ref 8.9–10.3)
CO2: 22 mmol/L (ref 22–32)
CREATININE: 0.91 mg/dL (ref 0.44–1.00)
Chloride: 112 mmol/L — ABNORMAL HIGH (ref 101–111)
GFR calc Af Amer: >60 mL/min (ref >60)
GFR, EST NON AFRICAN AMERICAN: 58 mL/min — AB (ref >60)
GLUCOSE: 81 mg/dL (ref 65–99)
Potassium: 3.3 mmol/L — ABNORMAL LOW (ref 3.5–5.1)
SODIUM: 141 mmol/L (ref 135–145)

## 2017-06-16 LAB — C DIFFICILE QUICK SCREEN W PCR REFLEX
C DIFFICILE (CDIFF) INTERP: NOT DETECTED
C DIFFICILE (CDIFF) TOXIN: NEGATIVE
C Diff antigen: NEGATIVE

## 2017-06-16 NOTE — Progress Notes (Signed)
CC: Diverticulitis Subjective: This patient admitted to the hospital with diverticulitis and UTI.  She is feeling better still has some pain some mild nausea fevers or chills Having diarrhea Objective: Vital signs in last 24 hours: Temp:  [97.7 F (36.5 C)-98.7 F (37.1 C)] 97.8 F (36.6 C) (01/19 0735) Pulse Rate:  [78-87] 81 (01/19 0735) Resp:  [17-20] 18 (01/19 0735) BP: (118-148)/(47-53) 148/50 (01/19 0735) SpO2:  [94 %-100 %] 100 % (01/19 0735) Last BM Date: 06/15/17  Intake/Output from previous day: 01/18 0701 - 01/19 0700 In: 1973.8 [P.O.:240; I.V.:1633.8; IV Piggyback:100] Out: 6962 [Urine:1575] Intake/Output this shift: Total I/O In: -  Out: 50 [Urine:50]  Physical exam:  Afebrile vital signs stable abdomen is soft only minimally tender in the left lower quadrant without peritoneal signs awake alert and oriented  Lab Results: CBC  Recent Labs    06/15/17 0442 06/16/17 0534  WBC 2.0* 2.3*  HGB 12.4 12.1  HCT 37.9 37.4  PLT 185 196   BMET Recent Labs    06/15/17 0442 06/16/17 0534  NA 140 141  K 2.9* 3.3*  CL 109 112*  CO2 21* 22  GLUCOSE 82 81  BUN 5* 5*  CREATININE 0.86 0.91  CALCIUM 7.3* 7.7*   PT/INR No results for input(s): LABPROT, INR in the last 72 hours. ABG No results for input(s): PHART, HCO3 in the last 72 hours.  Invalid input(s): PCO2, PO2  Studies/Results: Ct Abdomen Pelvis W Contrast  Result Date: 06/14/2017 CLINICAL DATA:  Generalized weakness.  History of diverticulitis. EXAM: CT ABDOMEN AND PELVIS WITH CONTRAST TECHNIQUE: Multidetector CT imaging of the abdomen and pelvis was performed using the standard protocol following bolus administration of intravenous contrast. CONTRAST:  63mL ISOVUE-370 IOPAMIDOL (ISOVUE-370) INJECTION 76% COMPARISON:  None. FINDINGS: Lower chest: Small to moderate hiatal hernia. Otherwise unremarkable. Hepatobiliary: No focal abnormality within the liver parenchyma. Gallbladder surgically absent. Mild  distention of the common bile duct is within normal limits for patient age. Pancreas: No focal mass lesion. No dilatation of the main duct. No intraparenchymal cyst. No peripancreatic edema. Spleen: 9 mm subcapsular low-density lesion in the spleen is stable since prior study and likely reflects a tiny cyst or pseudocyst. Adrenals/Urinary Tract: No adrenal nodule or mass. Subtle areas of segmental edema in the kidneys bilaterally are compatible with pyelonephritis. 2 mm nonobstructing stone is identified in the interpolar left kidney and another 2-3 mm nonobstructing stone is identified in the upper pole the left kidney. No evidence for hydroureter. Bladder is distended. Stomach/Bowel: Small to moderate hiatal hernia. Stomach otherwise unremarkable. Duodenum is normally positioned as is the ligament of Treitz. No small bowel wall thickening. No small bowel dilatation. The terminal ileum is normal. The appendix is normal. Diverticular changes are noted in the left colon. There is a persistent focus of probable small abscess adjacent to the colon on image 58 series 2 measuring 9 x 20 mm and too small to drain. This appears decreased when comparing to the prior study and is better demonstrated given the intravenous contrast used on today's study. Vascular/Lymphatic: There is abdominal aortic atherosclerosis without aneurysm. There is no gastrohepatic or hepatoduodenal ligament lymphadenopathy. No intraperitoneal or retroperitoneal lymphadenopathy. No pelvic sidewall lymphadenopathy. Reproductive: The uterus has normal CT imaging appearance. There is no adnexal mass. Other: No intraperitoneal free fluid. Musculoskeletal: Bone windows reveal no worrisome lytic or sclerotic osseous lesions. IMPRESSION: 1. Areas of segmental edema in both kidneys compatible with pyelonephritis. No evidence for intrarenal abscess. 2. Persistent edema/inflammation associated  with the sigmoid colon superimposed on a background of  diverticulosis. Imaging features remain compatible with diverticulitis and there is a small para colonic abscess (9 x 20 mm, too small to percutaneously drain), better demonstrated on today's study with intravenous contrast material. 3. Aortic Atherosclerois (ICD10-170.0) 4. Small to moderate hiatal hernia. Electronically Signed   By: Misty Stanley M.D.   On: 06/14/2017 18:24    Anti-infectives: Anti-infectives (From admission, onward)   Start     Dose/Rate Route Frequency Ordered Stop   06/15/17 0200  piperacillin-tazobactam (ZOSYN) IVPB 3.375 g     3.375 g 12.5 mL/hr over 240 Minutes Intravenous Every 8 hours 06/14/17 2018     06/14/17 1600  piperacillin-tazobactam (ZOSYN) IVPB 3.375 g     3.375 g 100 mL/hr over 30 Minutes Intravenous  Once 06/14/17 1552 06/14/17 1708      Assessment/Plan:  White blood cell count remains depressed.  Otherwise patient is improving.  Recommend continue IV antibiotics.  She has some diarrhea and checking C. difficile may be of value as well.  Florene Glen, MD, FACS  06/16/2017

## 2017-06-16 NOTE — Progress Notes (Signed)
[place] [facility] [place] NAME: Celena Lanius    MR#:  [id number]  DATE OF BIRTH:  11-Sep-1934  SUBJECTIVE:   Patient to due to weakness and noted to have sigmoid diverticulitis on CT abdomen with a small abscess. Patient denies any abdominal pain, nausea, vomiting, fever, chills today. Tolerating a full liquid diet well. Patient feels overall weak still.  REVIEW OF SYSTEMS:    Review of Systems  Constitutional: Negative for chills and fever.  HENT: Negative for congestion and tinnitus.   Eyes: Negative for blurred vision and double vision.  Respiratory: Negative for cough, shortness of breath and wheezing.   Cardiovascular: Negative for chest pain, orthopnea and PND.  Gastrointestinal: Negative for abdominal pain, diarrhea, nausea and vomiting.  Genitourinary: Negative for dysuria and hematuria.  Neurological: Positive for weakness. Negative for dizziness, sensory change and focal weakness.  All other systems reviewed and are negative.   Nutrition: Full liquid Tolerating Diet: Yes Tolerating PT: Ambulatory   DRUG ALLERGIES:   Allergies  Allergen Reactions  . Benzalkonium Chloride     Other reaction(s): Unknown  . Escitalopram     Deveioped a twitch  . Neomycin-Bacitracin Zn-Polymyx     Other reaction(s): Other (See Comments) REACTION: blisters skin REACTION: blisters skin    VITALS:  Blood pressure (!) 144/53, pulse 88, temperature 97.7 F (36.5 C), temperature source Oral, resp. rate 17, height 5\' 6"  (1.676 m), weight 73.5 kg (162 lb), SpO2 98 %.  PHYSICAL EXAMINATION:   Physical Exam  GENERAL:  83 y.o.-year-old patient lying in bed in no acute distress.  EYES: Pupils equal, round, reactive to light and accommodation. No scleral icterus. Extraocular muscles intact.  HEENT: Head atraumatic, normocephalic. Oropharynx and nasopharynx clear.  NECK:  Supple, no jugular venous distention. No thyroid enlargement, no tenderness.   LUNGS: Normal breath sounds bilaterally, no wheezing, rales, rhonchi. No use of accessory muscles of respiration.  CARDIOVASCULAR: S1, S2 normal. No murmurs, rubs, or gallops.  ABDOMEN: Soft, mild left lower quadrant tenderness, no rebound, rigidity, nondistended. Bowel sounds present. No organomegaly or mass.  EXTREMITIES: No cyanosis, clubbing or edema b/l.    NEUROLOGIC: Cranial nerves II through XII are intact. No focal Motor or sensory deficits b/l.  PSYCHIATRIC: The patient is alert and oriented x 3.  SKIN: No obvious rash, lesion, or ulcer.    LABORATORY PANEL:   CBC Recent Labs  Lab 06/16/17 0534  WBC 2.3*  HGB 12.1  HCT 37.4  PLT 196   ------------------------------------------------------------------------------------------------------------------  Chemistries  Recent Labs  Lab 06/14/17 1702 06/15/17 0442 06/16/17 0534  NA 138 140 141  K 3.9 2.9* 3.3*  CL 104 109 112*  CO2 24 21* 22  GLUCOSE 132* 82 81  BUN 7 5* 5*  CREATININE 1.04* 0.86 0.91  CALCIUM 8.0* 7.3* 7.7*  MG  --  1.8  --   AST 23  --   --   ALT 9*  --   --   ALKPHOS 59  --   --   BILITOT 0.8  --   --    ------------------------------------------------------------------------------------------------------------------  Cardiac Enzymes No results for input(s): TROPONINI in the last 168 hours. ------------------------------------------------------------------------------------------------------------------  RADIOLOGY:  Ct Abdomen Pelvis W Contrast  Result Date: 06/14/2017 CLINICAL DATA:  Generalized weakness.  History of diverticulitis. EXAM: CT ABDOMEN AND PELVIS WITH CONTRAST TECHNIQUE: Multidetector CT imaging of the abdomen and pelvis was performed using the standard protocol following bolus administration of intravenous contrast. CONTRAST:  62mL ISOVUE-370 IOPAMIDOL (ISOVUE-370) INJECTION 76% COMPARISON:  None. FINDINGS: Lower chest: Small to moderate hiatal hernia. Otherwise unremarkable.  Hepatobiliary: No focal abnormality within the liver parenchyma. Gallbladder surgically absent. Mild distention of the common bile duct is within normal limits for patient age. Pancreas: No focal mass lesion. No dilatation of the main duct. No intraparenchymal cyst. No peripancreatic edema. Spleen: 9 mm subcapsular low-density lesion in the spleen is stable since prior study and likely reflects a tiny cyst or pseudocyst. Adrenals/Urinary Tract: No adrenal nodule or mass. Subtle areas of segmental edema in the kidneys bilaterally are compatible with pyelonephritis. 2 mm nonobstructing stone is identified in the interpolar left kidney and another 2-3 mm nonobstructing stone is identified in the upper pole the left kidney. No evidence for hydroureter. Bladder is distended. Stomach/Bowel: Small to moderate hiatal hernia. Stomach otherwise unremarkable. Duodenum is normally positioned as is the ligament of Treitz. No small bowel wall thickening. No small bowel dilatation. The terminal ileum is normal. The appendix is normal. Diverticular changes are noted in the left colon. There is a persistent focus of probable small abscess adjacent to the colon on image 58 series 2 measuring 9 x 20 mm and too small to drain. This appears decreased when comparing to the prior study and is better demonstrated given the intravenous contrast used on today's study. Vascular/Lymphatic: There is abdominal aortic atherosclerosis without aneurysm. There is no gastrohepatic or hepatoduodenal ligament lymphadenopathy. No intraperitoneal or retroperitoneal lymphadenopathy. No pelvic sidewall lymphadenopathy. Reproductive: The uterus has normal CT imaging appearance. There is no adnexal mass. Other: No intraperitoneal free fluid. Musculoskeletal: Bone windows reveal no worrisome lytic or sclerotic osseous lesions. IMPRESSION: 1. Areas of segmental edema in both kidneys compatible with pyelonephritis. No evidence for intrarenal abscess. 2.  Persistent edema/inflammation associated with the sigmoid colon superimposed on a background of diverticulosis. Imaging features remain compatible with diverticulitis and there is a small para colonic abscess (9 x 20 mm, too small to percutaneously drain), better demonstrated on today's study with intravenous contrast material. 3. Aortic Atherosclerois (ICD10-170.0) 4. Small to moderate hiatal hernia. Electronically Signed   By: Misty Stanley M.D.   On: 06/14/2017 18:24     ASSESSMENT AND PLAN:   83 year old female with past medical history of irritable bowel syndrome, osteoporosis, hyperlipidemia, GERD, history of breast cancer, history of nephrolithiasis who presented to the hospital due to weakness and abdominal pain and noted to have CT scan findings suggestive of acute diverticulitis.  1. Acute diverticulitis-patient on CT abdomen pelvis on admission noted to have a sigmoid diverticulitis and also a small para colonic abscess. -Continue IV antibiotics with Zosyn. The abscess is too small to drain percutaneously. Continue supportive care. -appreciate surgical input and no acute surgical indication presently. Continue supportive care as mentioned above with fluids, antibiotics and pain medications. -Patient tolerating a full liquid diet and currently afebrile and hemodynamically stable. --Stool for c diff  2. History of temporal arteritis-continue maintenance prednisone.  3. History of IBS-continue Bentyl.  4. Anxiety-continue Xanax as needed.  5. Hypokalemia-secondary to the underlying diarrhea from sigmoid diverticulitis. We'll continue supplemental repeat level in the morning.  Mg. Level normal.     All the records are reviewed and case discussed with Care Management/Social Worker. Management plans discussed with the patient, family and they are in agreement.  CODE STATUS: Full code  DVT Prophylaxis: Lovenox  TOTAL TIME TAKING CARE OF THIS PATIENT: 30 minutes.   POSSIBLE D/C IN  1-2 DAYS, DEPENDING ON CLINICAL  CONDITION.   Fritzi Mandes M.D on 06/16/2017 at 1:26 PM  Between 7am to 6pm - Pager - [phone]  After 6pm go to [email or web address] - Proofreader  [facility] [place] [facility]  Office  [phone]  CC: Primary care physician; Baxter Hire, MD

## 2017-06-17 MED ORDER — ENSURE ENLIVE PO LIQD: 237.0000 mL | Freq: Two times a day (BID) | ORAL | 12 refills | Status: DC

## 2017-06-17 MED ORDER — AMOXICILLIN-POT CLAVULANATE 875-125 MG PO TABS
1.0000 | ORAL_TABLET | Freq: Two times a day (BID) | ORAL | Status: DC
  Administered 2017-06-17: 1 via ORAL
  Filled 2017-06-17 (×3): qty 1

## 2017-06-17 MED ORDER — AMOXICILLIN-POT CLAVULANATE 875-125 MG PO TABS: 1.0000 | ORAL_TABLET | Freq: Two times a day (BID) | ORAL | 0 refills | Status: AC

## 2017-06-17 NOTE — Progress Notes (Signed)
Discharge to home.  To car via auxillary in wheelchair.

## 2017-06-17 NOTE — Discharge Summary (Signed)
[facility] [place] NAME: Andalyn Heckstall    MR#:  [id number]  DATE OF BIRTH:  12-17-1934  DATE OF ADMISSION:  06/14/2017 ADMITTING PHYSICIAN: Dustin Flock, MD  DATE OF DISCHARGE: 06/17/2017  PRIMARY CARE PHYSICIAN: Baxter Hire, MD    ADMISSION DIAGNOSIS:  Pyelonephritis [N12] Altered mental status, unspecified altered mental status type [R41.82] Abscess of sigmoid colon [K63.0] Diverticulitis of large intestine with complication [X32.44]  DISCHARGE DIAGNOSIS:  Acute Sigmoid diverticulitis with small abscess--medical mnx  SECONDARY DIAGNOSIS:   Past Medical History:  Diagnosis Date  . Breast cancer ([place])   . Depression   . Diverticulosis   . GERD (gastroesophageal reflux disease)   . Hiatal hernia   . History of Bell's palsy   . History of colon polyps   . History of nephrolithiasis   . History of pancreatitis   . Hyperlipidemia   . IBS (irritable bowel syndrome)   . Osteoporosis   . Temporal arteritis ([place])   . Tubular adenoma of colon     HOSPITAL COURSE:  83 year old female with past medical history of irritable bowel syndrome, osteoporosis, hyperlipidemia, GERD, history of breast cancer, history of nephrolithiasis who presented to the hospital due to weakness and abdominal pain and noted to have CT scan findings suggestive of acute diverticulitis.  1. Acute diverticulitis-patient on CT abdomen pelvis on admission noted to have a sigmoid diverticulitis and also a small para colonic abscess. -Continue IV antibiotics with Zosyn---change to oral augmentin. The abscess is too small to drain percutaneously. Continue supportive care. -appreciate surgical input and no acute surgical indication presently.  -Patient tolerating a full liquid diet and currently afebrile and hemodynamically stable. --Stool for c diff--negative -pt had small normal stool  2. History of temporal arteritis-continue maintenance  prednisone.  3. History of IBS-continue Bentyl.  4. Anxiety-continue Xanax as needed.  5. Hypokalemia-secondary to the underlying diarrhea from sigmoid diverticulitis. We'll continue supplemental repeat level in the morning.  Mg. Level normal.    Overall improving. D/c home D/w dr cooper  CONSULTS OBTAINED:  Treatment Team:  Florene Glen, MD  DRUG ALLERGIES:   Allergies  Allergen Reactions  . Benzalkonium Chloride     Other reaction(s): Unknown  . Escitalopram     Deveioped a twitch  . Neomycin-Bacitracin Zn-Polymyx     Other reaction(s): Other (See Comments) REACTION: blisters skin REACTION: blisters skin    DISCHARGE MEDICATIONS:   Allergies as of 06/17/2017      Reactions   Benzalkonium Chloride    Other reaction(s): Unknown   Escitalopram    Deveioped a twitch   Neomycin-bacitracin Zn-polymyx    Other reaction(s): Other (See Comments) REACTION: blisters skin REACTION: blisters skin      Medication List    STOP taking these medications   ciprofloxacin 500 MG tablet Commonly known as:  CIPRO   metroNIDAZOLE 500 MG tablet Commonly known as:  FLAGYL     TAKE these medications   ALPRAZolam 0.25 MG tablet Commonly known as:  XANAX Take 0.25 mg by mouth 2 (two) times daily as needed.   amoxicillin-clavulanate 875-125 MG tablet Commonly known as:  AUGMENTIN Take 1 tablet by mouth every 12 (twelve) hours for 7 days.   aspirin EC 81 MG tablet Take 81 mg daily by mouth.   B-12 PO Take 2,500 mcg daily by mouth.   dicyclomine 20 MG tablet Commonly known as:  BENTYL Take 1 tablet (20 mg total) by mouth  3 (three) times daily before meals.   estradiol 1 MG tablet Commonly known as:  ESTRACE Take 1 mg daily by mouth.   feeding supplement (ENSURE ENLIVE) Liqd Take 237 mLs by mouth 2 (two) times daily between meals.   ondansetron 4 MG disintegrating tablet Commonly known as:  ZOFRAN ODT Take 1 tablet (4 mg total) every 8 (eight) hours as needed  by mouth for nausea or vomiting.   predniSONE 5 MG tablet Commonly known as:  DELTASONE Take 10 mg daily by mouth.   traMADol 50 MG tablet Commonly known as:  ULTRAM Take 50 mg 2 (two) times daily by mouth. One tablet by mouth twice daily   Vitamin D3 2000 units capsule Take 2,000 Units daily by mouth.       If you experience worsening of your admission symptoms, develop shortness of breath, life threatening emergency, suicidal or homicidal thoughts you must seek medical attention immediately by calling 911 or calling your MD immediately  if symptoms less severe.  You Must read complete instructions/literature along with all the possible adverse reactions/side effects for all the Medicines you take and that have been prescribed to you. Take any new Medicines after you have completely understood and accept all the possible adverse reactions/side effects.   Please note  You were cared for by a hospitalist during your hospital stay. If you have any questions about your discharge medications or the care you received while you were in the hospital after you are discharged, you can call the unit and asked to speak with the hospitalist on call if the hospitalist that took care of you is not available. Once you are discharged, your primary care physician will handle any further medical issues. Please note that NO REFILLS for any discharge medications will be authorized once you are discharged, as it is imperative that you return to your primary care physician (or establish a relationship with a primary care physician if you do not have one) for your aftercare needs so that they can reassess your need for medications and monitor your lab values. Today   SUBJECTIVE   Some weakness Had small normal BM  VITAL SIGNS:  Blood pressure (!) 140/47, pulse 94, temperature 97.9 F (36.6 C), temperature source Oral, resp. rate 18, height 5\' 6"  (1.676 m), weight 73.5 kg (162 lb), SpO2 96 %.  I/O:     Intake/Output Summary (Last 24 hours) at 06/17/2017 0906 Last data filed at 06/17/2017 0858 Gross per 24 hour  Intake 1080 ml  Output 3100 ml  Net -2020 ml    PHYSICAL EXAMINATION:  GENERAL:  83 y.o.-year-old patient lying in the bed with no acute distress.  EYES: Pupils equal, round, reactive to light and accommodation. No scleral icterus. Extraocular muscles intact.  HEENT: Head atraumatic, normocephalic. Oropharynx and nasopharynx clear.  NECK:  Supple, no jugular venous distention. No thyroid enlargement, no tenderness.  LUNGS: Normal breath sounds bilaterally, no wheezing, rales,rhonchi or crepitation. No use of accessory muscles of respiration.  CARDIOVASCULAR: S1, S2 normal. No murmurs, rubs, or gallops.  ABDOMEN: Soft, non-tender, non-distended. Bowel sounds present. No organomegaly or mass.  EXTREMITIES: No pedal edema, cyanosis, or clubbing.  NEUROLOGIC: Cranial nerves II through XII are intact. Muscle strength 5/5 in all extremities. Sensation intact. Gait not checked.  PSYCHIATRIC: The patient is alert and oriented x 3.  SKIN: No obvious rash, lesion, or ulcer.   DATA REVIEW:   CBC  Recent Labs  Lab 06/16/17 0534  WBC 2.3*  HGB 12.1  HCT 37.4  PLT 196    Chemistries  Recent Labs  Lab 06/14/17 1702 06/15/17 0442 06/16/17 0534  NA 138 140 141  K 3.9 2.9* 3.3*  CL 104 109 112*  CO2 24 21* 22  GLUCOSE 132* 82 81  BUN 7 5* 5*  CREATININE 1.04* 0.86 0.91  CALCIUM 8.0* 7.3* 7.7*  MG  --  1.8  --   AST 23  --   --   ALT 9*  --   --   ALKPHOS 59  --   --   BILITOT 0.8  --   --     Microbiology Results   Recent Results (from the past 240 hour(s))  Blood culture (routine x 2)     Status: None (Preliminary result)   Collection Time: 06/14/17  5:02 PM  Result Value Ref Range Status   Specimen Description BLOOD LEFT HAND  Final   Special Requests   Final    BOTTLES DRAWN AEROBIC AND ANAEROBIC Blood Culture adequate volume   Culture   Final    NO  GROWTH 3 DAYS Performed at [facility], [address] [place], [place] [address]    Report Status PENDING  Incomplete  Blood culture (routine x 2)     Status: None (Preliminary result)   Collection Time: 06/14/17  5:02 PM  Result Value Ref Range Status   Specimen Description BLOOD LEFT ARM  Final   Special Requests   Final    BOTTLES DRAWN AEROBIC AND ANAEROBIC Blood Culture adequate volume   Culture   Final    NO GROWTH 3 DAYS Performed at [facility], [address], [place], [place] [address]    Report Status PENDING  Incomplete  C difficile quick scan w PCR reflex     Status: None   Collection Time: 06/16/17  4:29 PM  Result Value Ref Range Status   C Diff antigen NEGATIVE NEGATIVE Final   C Diff toxin NEGATIVE NEGATIVE Final   C Diff interpretation No C. difficile detected.  Final    Comment: Performed at [facility], [address] [place], [place] [address]    RADIOLOGY:  No results found.   Management plans discussed with the patient, family and they are in agreement.  CODE STATUS:     Code Status Orders  (From admission, onward)        Start     Ordered   06/14/17 1937  Full code  Continuous     06/14/17 1936    Code Status History    Date Active Date Inactive Code Status Order ID Comments User Context   This patient has a current code status but no historical code status.    Advance Directive Documentation     Most Recent Value  Type of Advance Directive  Healthcare Power of Attorney, Living will  Pre-existing out of facility DNR order (yellow form or pink MOST form)  No data  "MOST" Form in Place?  No data      TOTAL TIME TAKING CARE OF THIS PATIENT: *40* minutes.    Fritzi Mandes M.D on 06/17/2017 at 9:06 AM  Between 7am to 6pm - Pager - [phone] After 6pm go to [email or web address] - password [email or web address] [facility]  Office  [phone]  CC: Primary care physician; Baxter Hire,  MD

## 2017-06-17 NOTE — Progress Notes (Signed)
Discharge instr reviewed with patient and her daughter.  Verb u/o of f/u appts

## 2017-06-17 NOTE — Discharge Instructions (Signed)
Full liquid and soft diet for few days

## 2017-06-19 LAB — CULTURE, BLOOD (ROUTINE X 2)
CULTURE: NO GROWTH
CULTURE: NO GROWTH
SPECIAL REQUESTS: ADEQUATE
Special Requests: ADEQUATE

## 2017-07-26 ENCOUNTER — Other Ambulatory Visit: Payer: Self-pay | Admitting: Internal Medicine

## 2017-07-27 ENCOUNTER — Other Ambulatory Visit: Payer: Self-pay | Admitting: Internal Medicine

## 2017-07-27 DIAGNOSIS — K5792 Diverticulitis of intestine, part unspecified, without perforation or abscess without bleeding: Secondary | ICD-10-CM

## 2017-07-30 ENCOUNTER — Ambulatory Visit
Admission: RE | Admit: 2017-07-30 | Discharge: 2017-07-30 | Disposition: A | Discharge status: Home / Self Care | Payer: Medicare Other | Source: Ambulatory Visit | Attending: Internal Medicine | Admitting: Internal Medicine

## 2017-07-30 DIAGNOSIS — N200 Calculus of kidney: Secondary | ICD-10-CM | POA: Insufficient documentation

## 2017-07-30 DIAGNOSIS — K5730 Diverticulosis of large intestine without perforation or abscess without bleeding: Secondary | ICD-10-CM | POA: Insufficient documentation

## 2017-07-30 DIAGNOSIS — K5792 Diverticulitis of intestine, part unspecified, without perforation or abscess without bleeding: Secondary | ICD-10-CM

## 2017-07-30 DIAGNOSIS — K449 Diaphragmatic hernia without obstruction or gangrene: Secondary | ICD-10-CM

## 2017-07-30 DIAGNOSIS — I700 Atherosclerosis of aorta: Secondary | ICD-10-CM

## 2017-07-30 LAB — POCT I-STAT CREATININE: Creatinine, Ser: 0.9 mg/dL (ref 0.44–1.00)

## 2017-07-30 MED ORDER — IOPAMIDOL (ISOVUE-300) INJECTION 61%
100.0000 mL | Freq: Once | INTRAVENOUS | Status: AC | PRN
  Administered 2017-07-30: 100 mL via INTRAVENOUS

## 2017-08-02 ENCOUNTER — Other Ambulatory Visit: Payer: Self-pay

## 2017-08-02 ENCOUNTER — Inpatient Hospital Stay
Admission: AD | Admit: 2017-08-02 | Discharge: 2017-08-04 | DRG: 392 | Disposition: A | Discharge status: Home / Self Care | Payer: Medicare Other | Source: Ambulatory Visit | Attending: Internal Medicine | Admitting: Internal Medicine

## 2017-08-02 DIAGNOSIS — Z87891 Personal history of nicotine dependence: Secondary | ICD-10-CM

## 2017-08-02 DIAGNOSIS — K5792 Diverticulitis of intestine, part unspecified, without perforation or abscess without bleeding: Secondary | ICD-10-CM | POA: Diagnosis present

## 2017-08-02 DIAGNOSIS — K5732 Diverticulitis of large intestine without perforation or abscess without bleeding: Principal | ICD-10-CM | POA: Diagnosis present

## 2017-08-02 DIAGNOSIS — Z853 Personal history of malignant neoplasm of breast: Secondary | ICD-10-CM

## 2017-08-02 DIAGNOSIS — Z888 Allergy status to other drugs, medicaments and biological substances status: Secondary | ICD-10-CM | POA: Diagnosis not present

## 2017-08-02 DIAGNOSIS — K589 Irritable bowel syndrome without diarrhea: Secondary | ICD-10-CM | POA: Diagnosis present

## 2017-08-02 DIAGNOSIS — M316 Other giant cell arteritis: Secondary | ICD-10-CM | POA: Diagnosis present

## 2017-08-02 DIAGNOSIS — Z79899 Other long term (current) drug therapy: Secondary | ICD-10-CM

## 2017-08-02 DIAGNOSIS — F419 Anxiety disorder, unspecified: Secondary | ICD-10-CM | POA: Diagnosis present

## 2017-08-02 DIAGNOSIS — Z9010 Acquired absence of unspecified breast and nipple: Secondary | ICD-10-CM

## 2017-08-02 DIAGNOSIS — Z7952 Long term (current) use of systemic steroids: Secondary | ICD-10-CM | POA: Diagnosis not present

## 2017-08-02 DIAGNOSIS — Z7982 Long term (current) use of aspirin: Secondary | ICD-10-CM

## 2017-08-02 LAB — CBC
HCT: 39.6 % (ref 35.0–47.0)
Hemoglobin: 12.8 g/dL (ref 12.0–16.0)
MCH: 29.5 pg (ref 26.0–34.0)
MCHC: 32.3 g/dL (ref 32.0–36.0)
MCV: 91.4 fL (ref 80.0–100.0)
PLATELETS: 265 10*3/uL (ref 150–440)
RBC: 4.33 MIL/uL (ref 3.80–5.20)
RDW: 16.1 % — AB (ref 11.5–14.5)
WBC: 1.8 10*3/uL — AB (ref 3.6–11.0)

## 2017-08-02 LAB — COMPREHENSIVE METABOLIC PANEL
ALBUMIN: 3.5 g/dL (ref 3.5–5.0)
ALT: 9 U/L — ABNORMAL LOW (ref 14–54)
ANION GAP: 11 (ref 5–15)
AST: 23 U/L (ref 15–41)
Alkaline Phosphatase: 62 U/L (ref 38–126)
BUN: 9 mg/dL (ref 6–20)
CHLORIDE: 104 mmol/L (ref 101–111)
CO2: 24 mmol/L (ref 22–32)
Calcium: 8.7 mg/dL — ABNORMAL LOW (ref 8.9–10.3)
Creatinine, Ser: 0.96 mg/dL (ref 0.44–1.00)
GFR calc Af Amer: >60 mL/min (ref >60)
GFR, EST NON AFRICAN AMERICAN: 54 mL/min — AB (ref >60)
Glucose, Bld: 100 mg/dL — ABNORMAL HIGH (ref 65–99)
POTASSIUM: 4 mmol/L (ref 3.5–5.1)
Sodium: 139 mmol/L (ref 135–145)
Total Bilirubin: 1 mg/dL (ref 0.3–1.2)
Total Protein: 7 g/dL (ref 6.5–8.1)

## 2017-08-02 LAB — C DIFFICILE QUICK SCREEN W PCR REFLEX
C DIFFICILE (CDIFF) INTERP: NOT DETECTED
C DIFFICLE (CDIFF) ANTIGEN: NEGATIVE
C Diff toxin: NEGATIVE

## 2017-08-02 LAB — MAGNESIUM: MAGNESIUM: 2.2 mg/dL (ref 1.7–2.4)

## 2017-08-02 MED ORDER — SODIUM CHLORIDE 0.9 % IV SOLN
INTRAVENOUS | Status: DC
  Administered 2017-08-02 – 2017-08-04 (×3): via INTRAVENOUS

## 2017-08-02 MED ORDER — ENSURE ENLIVE PO LIQD
237.0000 mL | Freq: Two times a day (BID) | ORAL | Status: DC
  Administered 2017-08-03: 237 mL via ORAL

## 2017-08-02 MED ORDER — PREDNISONE 20 MG PO TABS
10.0000 mg | ORAL_TABLET | Freq: Every day | ORAL | Status: DC
  Administered 2017-08-03: 10 mg via ORAL
  Filled 2017-08-02: qty 1

## 2017-08-02 MED ORDER — ASPIRIN EC 81 MG PO TBEC
81.0000 mg | DELAYED_RELEASE_TABLET | Freq: Every day | ORAL | Status: DC
  Administered 2017-08-03 – 2017-08-04 (×2): 81 mg via ORAL
  Filled 2017-08-02 (×2): qty 1

## 2017-08-02 MED ORDER — ONDANSETRON HCL 4 MG/2ML IJ SOLN: 4.0000 mg | Freq: Four times a day (QID) | INTRAMUSCULAR | Status: DC | PRN

## 2017-08-02 MED ORDER — CIPROFLOXACIN IN D5W 200 MG/100ML IV SOLN
200.0000 mg | Freq: Two times a day (BID) | INTRAVENOUS | Status: DC
  Administered 2017-08-02 – 2017-08-04 (×4): 200 mg via INTRAVENOUS
  Filled 2017-08-02 (×5): qty 100

## 2017-08-02 MED ORDER — ALPRAZOLAM 0.25 MG PO TABS
0.2500 mg | ORAL_TABLET | Freq: Two times a day (BID) | ORAL | Status: DC | PRN
  Administered 2017-08-02 – 2017-08-03 (×2): 0.25 mg via ORAL
  Filled 2017-08-02 (×2): qty 1

## 2017-08-02 MED ORDER — DOCUSATE SODIUM 100 MG PO CAPS: 100.0000 mg | ORAL_CAPSULE | Freq: Two times a day (BID) | ORAL | Status: DC | PRN

## 2017-08-02 MED ORDER — VITAMIN D 1000 UNITS PO TABS
2000.0000 [IU] | ORAL_TABLET | Freq: Every day | ORAL | Status: DC
  Administered 2017-08-03 – 2017-08-04 (×2): 2000 [IU] via ORAL
  Filled 2017-08-02 (×2): qty 2

## 2017-08-02 MED ORDER — DICYCLOMINE HCL 20 MG PO TABS
20.0000 mg | ORAL_TABLET | Freq: Three times a day (TID) | ORAL | Status: DC
  Administered 2017-08-03 – 2017-08-04 (×5): 20 mg via ORAL
  Filled 2017-08-02 (×8): qty 1

## 2017-08-02 MED ORDER — METRONIDAZOLE IN NACL 5-0.79 MG/ML-% IV SOLN
500.0000 mg | Freq: Three times a day (TID) | INTRAVENOUS | Status: DC
  Administered 2017-08-02 – 2017-08-04 (×6): 500 mg via INTRAVENOUS
  Filled 2017-08-02 (×7): qty 100

## 2017-08-02 MED ORDER — PANTOPRAZOLE SODIUM 40 MG PO TBEC
40.0000 mg | DELAYED_RELEASE_TABLET | Freq: Every day | ORAL | Status: DC
  Administered 2017-08-03 – 2017-08-04 (×2): 40 mg via ORAL
  Filled 2017-08-02 (×2): qty 1

## 2017-08-02 MED ORDER — HEPARIN SODIUM (PORCINE) 5000 UNIT/ML IJ SOLN
5000.0000 [IU] | Freq: Three times a day (TID) | INTRAMUSCULAR | Status: DC
  Administered 2017-08-02 – 2017-08-03 (×4): 5000 [IU] via SUBCUTANEOUS
  Filled 2017-08-02 (×4): qty 1

## 2017-08-02 MED ORDER — TRAMADOL HCL 50 MG PO TABS: 50.0000 mg | ORAL_TABLET | Freq: Two times a day (BID) | ORAL | Status: DC | PRN

## 2017-08-02 MED ORDER — ESTRADIOL 1 MG PO TABS
1.0000 mg | ORAL_TABLET | Freq: Every day | ORAL | Status: DC
  Administered 2017-08-03 – 2017-08-04 (×2): 1 mg via ORAL
  Filled 2017-08-02 (×2): qty 1

## 2017-08-02 NOTE — H&P (Signed)
[facility] [place] NAME: [name]    MR#:  [id number]  DATE OF BIRTH:  09/07/1934  DATE OF ADMISSION:  08/02/2017  PRIMARY CARE PHYSICIAN: Baxter Hire, MD   REQUESTING/REFERRING PHYSICIAN: Edwina Barth  CHIEF COMPLAINT:  No chief complaint on file.   HISTORY OF PRESENT ILLNESS: [name]  is a 83 y.o. female with a known history of breast cancer, diverticulosis, hiatal hernia, Bell's palsy, nephrolithiasis, hyperlipidemia, irritable bowel syndrome, tubular adenoma of colon, temporal arteritis- was admitted in hospital for diverticulitis and small pericolonic abscess in January 2019, surgery decided to manage medically, IV antibiotics were given and then she was discharged with oral antibiotic course. She started feeling slightly better after finishing the antibiotic course but could never tolerate regular diet and gradually started worsening again.her primary care doctor had prescribed her antibiotics again and after taking that she still continued to feel lower abdominal pain with loose watery diarrhea without her control, chills- but no nausea or vomiting. Also denies any blood in the stool. Her PMD had sent her for a CT scan and that showed Persistent diverticulitis but resolution of the abscess, and patient is still symptomatic in spite of treating twice with antibiotic course so he decided to send the patient for IV antibiotics and admission.  PAST MEDICAL HISTORY:   Past Medical History:  Diagnosis Date  . Breast cancer ([place])   . Depression   . Diverticulosis   . GERD (gastroesophageal reflux disease)   . Hiatal hernia   . History of Bell's palsy   . History of colon polyps   . History of nephrolithiasis   . History of pancreatitis   . Hyperlipidemia   . IBS (irritable bowel syndrome)   . Osteoporosis   . Temporal arteritis ([place])   . Tubular adenoma of colon     PAST SURGICAL HISTORY:  Past Surgical History:   Procedure Laterality Date  . LAPAROSCOPIC CHOLECYSTECTOMY  2009  . MASTECTOMY  1983   bilateral    SOCIAL HISTORY:  Social History   Tobacco Use  . Smoking status: Former Smoker    Last attempt to quit: 05/17/2005    Years since quitting: 12.2  . Smokeless tobacco: Never Used  . Tobacco comment: quit 11 years ago  Substance Use Topics  . Alcohol use: No    Alcohol/week: 0.0 oz    FAMILY HISTORY:  Family History  Problem Relation Age of Onset  . Colon cancer Mother 10  . Scleroderma Father   . Stomach cancer Neg Hx     DRUG ALLERGIES:  Allergies  Allergen Reactions  . Benzalkonium Chloride     Other reaction(s): Unknown  . Escitalopram     Deveioped a twitch  . Neomycin-Bacitracin Zn-Polymyx     Other reaction(s): Other (See Comments) REACTION: blisters skin REACTION: blisters skin    REVIEW OF SYSTEMS:   CONSTITUTIONAL: No fever, positive for fatigue or weakness.  EYES: No blurred or double vision.  EARS, NOSE, AND THROAT: No tinnitus or ear pain.  RESPIRATORY: No cough, shortness of breath, wheezing or hemoptysis.  CARDIOVASCULAR: No chest pain, orthopnea, edema.  GASTROINTESTINAL: No nausea, vomiting,she have diarrhea or abdominal pain.  GENITOURINARY: No dysuria, hematuria.  ENDOCRINE: No polyuria, nocturia,  HEMATOLOGY: No anemia, easy bruising or bleeding SKIN: No rash or lesion. MUSCULOSKELETAL: No joint pain or arthritis.   NEUROLOGIC: No tingling, numbness, weakness.  PSYCHIATRY: No anxiety or depression.   MEDICATIONS AT HOME:  Prior to Admission medications   Medication Sig Start Date End Date Taking? Authorizing Provider  ALPRAZolam (XANAX) 0.25 MG tablet Take 0.25 mg by mouth 2 (two) times daily as needed.  11/03/10   [provider]  aspirin EC 81 MG tablet Take 81 mg daily by mouth.    [provider]  Cholecalciferol (VITAMIN D3) 2000 units capsule Take 2,000 Units daily by mouth.     [provider]   Cyanocobalamin (B-12 PO) Take 2,500 mcg daily by mouth.     [provider]  dicyclomine (BENTYL) 20 MG tablet Take 1 tablet (20 mg total) by mouth 3 (three) times daily before meals. 10/20/14   Lafayette Dragon, MD  estradiol (ESTRACE) 1 MG tablet Take 1 mg daily by mouth.  10/11/16   [provider]  feeding supplement, ENSURE ENLIVE, (ENSURE ENLIVE) LIQD Take 237 mLs by mouth 2 (two) times daily between meals. 06/17/17   Fritzi Mandes, MD  ondansetron (ZOFRAN ODT) 4 MG disintegrating tablet Take 1 tablet (4 mg total) every 8 (eight) hours as needed by mouth for nausea or vomiting. Patient not taking: Reported on 06/14/2017 04/16/17   Earleen Newport, MD  predniSONE (DELTASONE) 5 MG tablet Take 10 mg daily by mouth.     [provider]  traMADol (ULTRAM) 50 MG tablet Take 50 mg 2 (two) times daily by mouth. One tablet by mouth twice daily  11/21/10   [provider]      PHYSICAL EXAMINATION:   VITAL SIGNS: Blood pressure (!) 138/52, pulse 82, temperature 97.7 F (36.5 C), temperature source Oral, resp. rate 16, SpO2 100 %.  GENERAL:  83 y.o.-year-old patient lying in the bed with no acute distress.  EYES: Pupils equal, round, reactive to light and accommodation. No scleral icterus. Extraocular muscles intact.  HEENT: Head atraumatic, normocephalic. Oropharynx and nasopharynx clear.  NECK:  Supple, no jugular venous distention. No thyroid enlargement, no tenderness.  LUNGS: Normal breath sounds bilaterally, no wheezing, rales,rhonchi or crepitation. No use of accessory muscles of respiration.  CARDIOVASCULAR: S1, S2 normal. No murmurs, rubs, or gallops.  ABDOMEN: Soft, mild left lower quadrant tender, nondistended. Bowel sounds present. No organomegaly or mass.  EXTREMITIES: No pedal edema, cyanosis, or clubbing.  NEUROLOGIC: Cranial nerves II through XII are intact. Muscle strength 5/5 in all extremities. Sensation intact. Gait not checked.  PSYCHIATRIC:  The patient is alert and oriented x 3.  SKIN: No obvious rash, lesion, or ulcer.   LABORATORY PANEL:   CBC No results for input(s): WBC, HGB, HCT, PLT, MCV, MCH, MCHC, RDW, LYMPHSABS, MONOABS, EOSABS, BASOSABS, BANDABS in the last 168 hours.  Invalid input(s): NEUTRABS, BANDSABD ------------------------------------------------------------------------------------------------------------------  Chemistries  Recent Labs  Lab 07/30/17 1043  CREATININE 0.90   ------------------------------------------------------------------------------------------------------------------ CrCl cannot be calculated (Unknown ideal weight.). ------------------------------------------------------------------------------------------------------------------ No results for input(s): TSH, T4TOTAL, T3FREE, THYROIDAB in the last 72 hours.  Invalid input(s): FREET3   Coagulation profile No results for input(s): INR, PROTIME in the last 168 hours. ------------------------------------------------------------------------------------------------------------------- No results for input(s): DDIMER in the last 72 hours. -------------------------------------------------------------------------------------------------------------------  Cardiac Enzymes No results for input(s): CKMB, TROPONINI, MYOGLOBIN in the last 168 hours.  Invalid input(s): CK ------------------------------------------------------------------------------------------------------------------ Invalid input(s): POCBNP  ---------------------------------------------------------------------------------------------------------------  Urinalysis    Component Value Date/Time   COLORURINE YELLOW (A) 06/14/2017 1702   APPEARANCEUR HAZY (A) 06/14/2017 1702   APPEARANCEUR Clear 11/04/2014 0845   LABSPEC 1.023 06/14/2017 1702   LABSPEC 1.025 08/11/2012 1358   PHURINE 6.0 06/14/2017 1702  GLUCOSEU NEGATIVE 06/14/2017 1702   GLUCOSEU Negative 08/11/2012  1358   HGBUR NEGATIVE 06/14/2017 1702   BILIRUBINUR NEGATIVE 06/14/2017 1702   BILIRUBINUR Negative 11/04/2014 0845   BILIRUBINUR Negative 08/11/2012 1358   KETONESUR NEGATIVE 06/14/2017 1702   PROTEINUR NEGATIVE 06/14/2017 1702   UROBILINOGEN 1.0 08/30/2007 2131   NITRITE NEGATIVE 06/14/2017 1702   LEUKOCYTESUR SMALL (A) 06/14/2017 1702   LEUKOCYTESUR 2+ (A) 11/04/2014 0845   LEUKOCYTESUR Negative 08/11/2012 1358     RADIOLOGY: No results found.  EKG: Orders placed or performed in visit on 01/17/10  . Converted [facility] EKG    IMPRESSION AND PLAN:  * Acute diverticulitis   Recurrent episode, received treatment once inpatient and then outpatient already.   We will treat with IV Cipro and Flagyl for now, get surgical consult as she may need surgical intervention.   IV fluids and symptomatic supportive care.  * recurrent diarrhea   This could be secondary to her diverticulitis, but she had been on antibiotics repeatedly and I would like to rule out C. Difficile also.  * temporal arteritis   Patient takes prednisone at home, I'll continue the same  * anxiety   Continue alprazolam as needed.  * as she is direct admission, there are no labs available currently, I have ordered her labs for now including magnesium. We will have her follow on them.  All the records are reviewed and case discussed with ED provider. Management plans discussed with the patient, family and they are in agreement.  CODE STATUS:full code. Code Status History    Date Active Date Inactive Code Status Order ID Comments User Context   06/14/2017 19:37 06/17/2017 18:12 Full Code [id number]  Dustin Flock, MD ED     Patient's daughter is in the room.  TOTAL TIME TAKING CARE OF THIS PATIENT: 45 minutes.    Vaughan Basta M.D on 08/02/2017   Between 7am to 6pm - Pager - [phone]  After 6pm go to [email or web address] - password [email or web address] [facility]  Office   [phone]  CC: Primary care physician; Baxter Hire, MD   Note: This dictation was prepared with Dragon dictation along with smaller phrase technology. Any transcriptional errors that result from this process are unintentional.

## 2017-08-02 NOTE — Consult Note (Signed)
Surgical Consultation  08/02/2017  [name] is an 83 y.o. female.   Referring Physician: Dr. Harrel Robbins  CC: Acute diverticulitis  HPI: This patient with multiple episodes of acute diverticulitis.  She has had ongoing left lower quadrant pain and has been on nearly continuous antibiotics since December.  She has been hospitalized multiple times and has had multiple CT scans.  She also had an abscess at one point which is resolved.  She has had unrelenting diarrhea as well.  She was tested for C. difficile in January and it was negative.  It may warrant rechecking this again.  Of additional significance patient has temporal arteritis and has been on prednisone for 16 years.  She is currently on 5 mg/day.  She is also leukopenic she states she had a colonoscopy by [name]  2 years ago  Past Medical History:  Diagnosis Date  . Breast cancer ([place])   . Depression   . Diverticulosis   . GERD (gastroesophageal reflux disease)   . Hiatal hernia   . History of Bell's palsy   . History of colon polyps   . History of nephrolithiasis   . History of pancreatitis   . Hyperlipidemia   . IBS (irritable bowel syndrome)   . Osteoporosis   . Temporal arteritis ([place])   . Tubular adenoma of colon     Past Surgical History:  Procedure Laterality Date  . LAPAROSCOPIC CHOLECYSTECTOMY  2009  . MASTECTOMY  1983   bilateral    Family History  Problem Relation Age of Onset  . Colon cancer Mother 59  . Scleroderma Father   . Stomach cancer Neg Hx     Social History:  reports that she quit smoking about 12 years ago. she has never used smokeless tobacco. She reports that she does not drink alcohol or use drugs. At age 74 she does not work. Allergies:  Allergies  Allergen Reactions  . Benzalkonium Chloride     Other reaction(s): Unknown  . Escitalopram     Deveioped a twitch  . Neomycin-Bacitracin Zn-Polymyx     Other reaction(s): Other (See Comments) REACTION: blisters  skin REACTION: blisters skin    Medications reviewed.   Review of Systems:   Review of Systems  Constitutional: Negative.   HENT: Negative.   Eyes: Negative.   Respiratory: Negative.   Cardiovascular: Negative.   Gastrointestinal: Positive for abdominal pain and diarrhea. Negative for blood in stool, constipation, heartburn, melena, nausea and vomiting.  Genitourinary: Negative.   Musculoskeletal: Negative.   Skin: Negative.   Neurological: Negative.   Endo/Heme/Allergies: Negative.   Psychiatric/Behavioral: Negative.      Physical Exam:  BP (!) 138/52 (BP Location: Right Arm)   Pulse 82   Temp 97.7 F (36.5 C) (Oral)   Resp 16   Ht 5' 5" (1.651 m)   Wt 160 lb 15 oz (73 kg)   SpO2 100%   BMI 26.78 kg/m   Physical Exam  Constitutional: She is oriented to person, place, and time and well-developed, well-nourished, and in no distress. No distress.  HENT:  Head: Normocephalic and atraumatic.  Eyes: Pupils are equal, round, and reactive to light. Right eye exhibits no discharge. Left eye exhibits no discharge. No scleral icterus.  Neck: Normal range of motion. No JVD present.  Cardiovascular: Normal rate.  Pulmonary/Chest: She is in respiratory distress.  Abdominal: Soft. She exhibits no distension. There is no tenderness. There is no rebound and no guarding.  Minimal if any  tenderness in the left lower quadrant certainly no peritoneal signs no mass  Musculoskeletal: Normal range of motion. She exhibits no edema or tenderness.  Lymphadenopathy:    She has no cervical adenopathy.  Neurological: She is alert and oriented to person, place, and time.  Skin: Skin is warm and dry. She is not diaphoretic. No erythema.  Psychiatric: Mood and affect normal.  Vitals reviewed.     Results for orders placed or performed during the Robbins encounter of 08/02/17 (from the past 48 hour(s))  CBC     Status: Abnormal   Collection Time: 08/02/17  5:58 PM  Result Value Ref  Range   WBC 1.8 (L) 3.6 - 11.0 K/uL   RBC 4.33 3.80 - 5.20 MIL/uL   Hemoglobin 12.8 12.0 - 16.0 g/dL   HCT 39.6 35.0 - 47.0 %   MCV 91.4 80.0 - 100.0 fL   MCH 29.5 26.0 - 34.0 pg   MCHC 32.3 32.0 - 36.0 g/dL   RDW 16.1 (H) 11.5 - 14.5 %   Platelets 265 150 - 440 K/uL    Comment: Performed at [facility], [address] [place], [place] [address]  Comprehensive metabolic panel     Status: Abnormal   Collection Time: 08/02/17  5:58 PM  Result Value Ref Range   Sodium 139 135 - 145 mmol/L   Potassium 4.0 3.5 - 5.1 mmol/L   Chloride 104 101 - 111 mmol/L   CO2 24 22 - 32 mmol/L   Glucose, Bld 100 (H) 65 - 99 mg/dL   BUN 9 6 - 20 mg/dL   Creatinine, Ser 0.96 0.44 - 1.00 mg/dL   Calcium 8.7 (L) 8.9 - 10.3 mg/dL   Total Protein 7.0 6.5 - 8.1 g/dL   Albumin 3.5 3.5 - 5.0 g/dL   AST 23 15 - 41 U/L   ALT 9 (L) 14 - 54 U/L   Alkaline Phosphatase 62 38 - 126 U/L   Total Bilirubin 1.0 0.3 - 1.2 mg/dL   GFR calc non Af Amer 54 (L) >60 mL/min   GFR calc Af Amer >60 >60 mL/min    Comment: (NOTE) The eGFR has been calculated using the CKD EPI equation. This calculation has not been validated in all clinical situations. eGFR's persistently <60 mL/min signify possible Chronic Kidney Disease.    Anion gap 11 5 - 15    Comment: Performed at [facility], [address] [place], [place] [address]  Magnesium     Status: None   Collection Time: 08/02/17  5:58 PM  Result Value Ref Range   Magnesium 2.2 1.7 - 2.4 mg/dL    Comment: Performed at [facility], [address] [place], [place] [address]   No results found.  Assessment/Plan:  Patient is profoundly leukopenic and on prednisone.  She has had multiple episodes of diverticulitis.  She warrants colon resection.  However I am very concerned and her ability to heal and anastomosis without leakage and the risk of reoperation with colostomy and hernia etc. with prednisone and the leukopenic state.  This all  needs to be addressed prior to entertaining any sort of colon resection.  I did discuss with her and her family member some of these conditions and options but I also mentioned that a second opinion with a colorectal surgeon at [name] might be indicated as well.  I will review this with Tonya. Dahlia Byes and reexamine the patient but currently she is not acutely ill and does not  require a Hartmann's procedure.  Florene Glen, MD, FACS

## 2017-08-03 DIAGNOSIS — K5792 Diverticulitis of intestine, part unspecified, without perforation or abscess without bleeding: Secondary | ICD-10-CM

## 2017-08-03 LAB — BASIC METABOLIC PANEL
ANION GAP: 11 (ref 5–15)
BUN: 10 mg/dL (ref 6–20)
CALCIUM: 8.2 mg/dL — AB (ref 8.9–10.3)
CHLORIDE: 108 mmol/L (ref 101–111)
CO2: 24 mmol/L (ref 22–32)
Creatinine, Ser: 0.9 mg/dL (ref 0.44–1.00)
GFR calc non Af Amer: 59 mL/min — ABNORMAL LOW (ref >60)
Glucose, Bld: 79 mg/dL (ref 65–99)
Potassium: 3.6 mmol/L (ref 3.5–5.1)
SODIUM: 143 mmol/L (ref 135–145)

## 2017-08-03 LAB — CBC
HCT: 38 % (ref 35.0–47.0)
HEMOGLOBIN: 12.5 g/dL (ref 12.0–16.0)
MCH: 30.1 pg (ref 26.0–34.0)
MCHC: 32.9 g/dL (ref 32.0–36.0)
MCV: 91.3 fL (ref 80.0–100.0)
Platelets: 208 10*3/uL (ref 150–440)
RBC: 4.16 MIL/uL (ref 3.80–5.20)
RDW: 15.5 % — ABNORMAL HIGH (ref 11.5–14.5)
WBC: 2 10*3/uL — AB (ref 3.6–11.0)

## 2017-08-03 MED ORDER — ENSURE ENLIVE PO LIQD
237.0000 mL | Freq: Every day | ORAL | Status: DC
  Administered 2017-08-04: 237 mL via ORAL

## 2017-08-03 MED ORDER — PREDNISONE 5 MG PO TABS
5.0000 mg | ORAL_TABLET | Freq: Every day | ORAL | Status: DC
  Administered 2017-08-04: 5 mg via ORAL
  Filled 2017-08-03: qty 1

## 2017-08-03 NOTE — Progress Notes (Signed)
Initial Nutrition Assessment  DOCUMENTATION CODES:   Not applicable  INTERVENTION:   - Ensure Enlive PO once daily, each supplement provides 350 kcal and 20 grams of protein - Encourage PO intake  NUTRITION DIAGNOSIS:   Inadequate oral intake related to poor appetite, other (see comment)(fear of diarrhea) as evidenced by per patient/family report.  GOAL:   Patient will meet greater than or equal to 90% of their needs  MONITOR:   Supplement acceptance, I & O's, Weight trends, PO intake  REASON FOR ASSESSMENT:   Malnutrition Screening Tool   ASSESSMENT:   83 year old female with PMH significant for breast cancer, diverticulosis, hiatal hernia, Bell's palsy, nephrolithiasis, HLD, GERD, depression, osteoporosis, IBS, and tubular adenoma of colon. Pt is leukopenic on steroids. Pt admitted in January 2019 for diverticulitis and small pericolonic abscess. Pt admitted for antibiotic treatment of recurrent acute diverticulitis.  Spoke with pt at bedside who reports having a poor appetite for the past 4 months. During this time, pt has also had recurrent diverticulitis. Pt states the she has been eating soft foods due to fear of diarrhea. Pt has enjoyed having the Ensure Enlive supplement during current admission but states she will only drink one daily. RD will modify order for once daily.  Pt states she ate a few bites of breakfast and had a sweet potato and a few bites of macaroni and cheese for lunch. Pt drinking vanilla milkshake from family member at time of visit.  Per pt, UBW is 160 lbs. Pt states that she weighed over 175 lbs recently but that the weight was due to swelling around her midsection and fluid retention. RD noted pt weighed 74.3 kg in May 2016 and gained weight up to 79.8 kg in June 2018. RD suspects weight fluctuations due to fluid fluctuations rather than actual weight loss.  I/O's: +1.2  Medications reviewed and include: 2000 unite vitamin D daily, 40 mg Protonix  daily, 5 mg prednisone daily, IV Cipro and Flagyl  Labs reviewed.  NUTRITION - FOCUSED PHYSICAL EXAM:    Most Recent Value  Orbital Region  No depletion  Upper Arm Region  No depletion  Thoracic and Lumbar Region  No depletion  Buccal Region  No depletion  Temple Region  No depletion  Clavicle Bone Region  Mild depletion  Clavicle and Acromion Bone Region  Mild depletion  Scapular Bone Region  No depletion  Dorsal Hand  No depletion  Patellar Region  No depletion  Anterior Thigh Region  No depletion  Posterior Calf Region  No depletion  Edema (RD Assessment)  Mild  Hair  Reviewed  Eyes  Reviewed  Mouth  Reviewed  Skin  Reviewed  Nails  Reviewed       Diet Order:  Diet regular Room service appropriate? Yes; Fluid consistency: Thin  EDUCATION NEEDS:   No education needs have been identified at this time  Skin:  Skin Assessment: Reviewed RN Assessment  Last BM:  08/01/16  Height:   Ht Readings from Last 1 Encounters:  08/02/17 5\' 5"  (1.651 m)    Weight:   Wt Readings from Last 1 Encounters:  08/02/17 160 lb 15 oz (73 kg)    Ideal Body Weight:  56.8 kg  BMI:  Body mass index is 26.78 kg/m.  Estimated Nutritional Needs:   Kcal:  1600-1800 kcal/day  Protein:  90-105 grams/day  Fluid:  1.6-1.8 L/day    Gaynell Face, MS, RD, LDN Pager: [phone] Weekend/After Hours: [phone]

## 2017-08-03 NOTE — Progress Notes (Signed)
83 year old female very active and functional presented with an acute exacerbation of chronic diverticulitis.  The patient is leukopenic and steroids.  She is doing well reports minimal pain today. CT scan personally reviewed showing evidence of diverticulitis. Discussed with the patient detail about her disease process.  I do recommend continuation of antibiotics and she will need an elective sigmoid colectomy but given her risk factors and her age I would probably perform a diverting loop ileostomy with a primary anastomosis. She wishes to think about it and will like to talk to Dr. Jefm Bryant his rheumatologist. Currently there is no need for emergent surgical intervention at this time. I will continue to follow her while she is in-house. I Spent about 40 minutes in this encounter with greater than 50% spent in counseling and coordination of her care

## 2017-08-03 NOTE — Progress Notes (Signed)
[name]: [name]    MR#:  [id number]  DATE OF BIRTH:  1934-06-06  SUBJECTIVE:  Pt denies abdominal pain She is tolerating po regular diet  REVIEW OF SYSTEMS:   Review of Systems  Constitutional: Negative for chills, fever and weight loss.  HENT: Negative for ear discharge, ear pain and nosebleeds.   Eyes: Negative for blurred vision, pain and discharge.  Respiratory: Negative for sputum production, shortness of breath, wheezing and stridor.   Cardiovascular: Negative for chest pain, palpitations, orthopnea and PND.  Gastrointestinal: Negative for abdominal pain, diarrhea, nausea and vomiting.  Genitourinary: Negative for frequency and urgency.  Musculoskeletal: Negative for back pain and joint pain.  Neurological: Positive for weakness. Negative for sensory change, speech change and focal weakness.  Psychiatric/Behavioral: Negative for depression and hallucinations. The patient is not nervous/anxious.    Tolerating Diet:yes Tolerating PT: ambulatory  DRUG ALLERGIES:   Allergies  Allergen Reactions  . Benzalkonium Chloride     Other reaction(s): Unknown  . Escitalopram     Deveioped a twitch  . Neomycin-Bacitracin Zn-Polymyx     Other reaction(s): Other (See Comments) REACTION: blisters skin REACTION: blisters skin    VITALS:  Blood pressure (!) 149/65, pulse 95, temperature 98.6 F (37 C), temperature source Oral, resp. rate 18, height 5\' 5"  (1.651 m), weight 73 kg (160 lb 15 oz), SpO2 98 %.  PHYSICAL EXAMINATION:   Physical Exam  GENERAL:  83 y.o.-year-old patient lying in the bed with no acute distress.  EYES: Pupils equal, round, reactive to light and accommodation. No scleral icterus. Extraocular muscles intact.  HEENT: Head atraumatic, normocephalic. Oropharynx and nasopharynx clear.  NECK:  Supple, no jugular venous distention. No thyroid enlargement, no tenderness.  LUNGS: Normal  breath sounds bilaterally, no wheezing, rales, rhonchi. No use of accessory muscles of respiration.  CARDIOVASCULAR: S1, S2 normal. No murmurs, rubs, or gallops.  ABDOMEN: Soft, nontender, nondistended. Bowel sounds present. No organomegaly or mass.  EXTREMITIES: No cyanosis, clubbing or edema b/l.    NEUROLOGIC: Cranial nerves II through XII are intact. No focal Motor or sensory deficits b/l.   PSYCHIATRIC:  patient is alert and oriented x 3.  SKIN: No obvious rash, lesion, or ulcer.   LABORATORY PANEL:  CBC Recent Labs  Lab 08/03/17 0404  WBC 2.0*  HGB 12.5  HCT 38.0  PLT 208    Chemistries  Recent Labs  Lab 08/02/17 1758 08/03/17 0404  NA 139 143  K 4.0 3.6  CL 104 108  CO2 24 24  GLUCOSE 100* 79  BUN 9 10  CREATININE 0.96 0.90  CALCIUM 8.7* 8.2*  MG 2.2  --   AST 23  --   ALT 9*  --   ALKPHOS 62  --   BILITOT 1.0  --    Cardiac Enzymes No results for input(s): TROPONINI in the last 168 hours. RADIOLOGY:  No results found. ASSESSMENT AND PLAN:   [name]  is a 83 y.o. female with a known history of breast cancer, diverticulosis, hiatal hernia, Bell's palsy, nephrolithiasis, hyperlipidemia, irritable bowel syndrome, tubular adenoma of colon, temporal arteritis on chronic steroids came to the ER with abd pain and diarrhea  * Acute sigmoid diverticulitis--recurrent   Recurrent episode, received treatment once inpatient and then outpatient already.   We will treat with IV Cipro and Flagyl for now, get surgical consult as she may need surgical intervention--recommends elective sigmoid colon resection. Pt  also advised second opinion with [facility] colorectal surgery -IV fluids and symptomatic supportive care.  * recurrent diarrhea   This could be secondary to her diverticulitis, but she had been on antibiotics repeatedly-c diff negative  * temporal arteritis   Patient takes prednisone at home--chronically. Currently on 5 mg qd  * anxiety   Continue  alprazolam as needed.  Case discussed with Care Management/Social Worker. Management plans discussed with the patient, family and they are in agreement.  CODE STATUS: full  DVT Prophylaxis: lovenox  TOTAL TIME TAKING CARE OF THIS PATIENT: 30 minutes.  >50% time spent on counselling and coordination of care  POSSIBLE D/C IN 1-2 DAYS, DEPENDING ON CLINICAL CONDITION.  Note: This dictation was prepared with Dragon dictation along with smaller phrase technology. Any transcriptional errors that result from this process are unintentional.  Fritzi Mandes M.D on 08/03/2017 at 3:26 PM  Between 7am to 6pm - Pager - [phone]  After 6pm go to [email or web address] - password [email or web address] [facility]  Office  [phone]  CC: Primary care physician; Baxter Hire, MDPatient ID: [name], female   DOB: [birth year], 83 y.o.   MRN: [id number]

## 2017-08-04 MED ORDER — METRONIDAZOLE 500 MG PO TABS
500.0000 mg | ORAL_TABLET | Freq: Three times a day (TID) | ORAL | Status: DC
  Filled 2017-08-04 (×2): qty 1

## 2017-08-04 MED ORDER — METRONIDAZOLE 500 MG PO TABS: 500.0000 mg | ORAL_TABLET | Freq: Three times a day (TID) | ORAL | 0 refills | Status: AC

## 2017-08-04 MED ORDER — CIPROFLOXACIN HCL 500 MG PO TABS: 500.0000 mg | ORAL_TABLET | Freq: Two times a day (BID) | ORAL | Status: DC

## 2017-08-04 MED ORDER — CIPROFLOXACIN HCL 500 MG PO TABS: 500.0000 mg | ORAL_TABLET | Freq: Two times a day (BID) | ORAL | 0 refills | Status: AC

## 2017-08-04 NOTE — Discharge Instructions (Signed)
Patient advised to get second opinion from [facility] colorectal surgeon

## 2017-08-04 NOTE — Progress Notes (Signed)
CC: diverticulitis Subjective: Feeling better, + bm. Improving pain  Objective: Vital signs in last 24 hours: Temp:  [98.6 F (37 C)-98.9 F (37.2 C)] 98.9 F (37.2 C) (03/09 0449) Pulse Rate:  [74-95] 74 (03/09 0449) Resp:  [16-20] 20 (03/09 0449) BP: (149-154)/(47-65) 154/54 (03/09 0449) SpO2:  [98 %] 98 % (03/09 0449) Last BM Date: 08/03/17  Intake/Output from previous day: 03/08 0701 - 03/09 0700 In: 2092.3 [P.O.:120; I.V.:1572.3; IV Piggyback:400] Out: 1050 [Urine:1050] Intake/Output this shift: Total I/O In: 174 [IV Piggyback:174] Out: -   Physical exam: NAD, awake alert Abd: soft, minimal tenderness , no peritonitis Ext: well perfused and warm   Lab Results: CBC  Recent Labs    08/02/17 1758 08/03/17 0404  WBC 1.8* 2.0*  HGB 12.8 12.5  HCT 39.6 38.0  PLT 265 208   BMET Recent Labs    08/02/17 1758 08/03/17 0404  NA 139 143  K 4.0 3.6  CL 104 108  CO2 24 24  GLUCOSE 100* 79  BUN 9 10  CREATININE 0.96 0.90  CALCIUM 8.7* 8.2*   PT/INR No results for input(s): LABPROT, INR in the last 72 hours. ABG No results for input(s): PHART, HCO3 in the last 72 hours.  Invalid input(s): PCO2, PO2  Studies/Results: No results found.  Anti-infectives: Anti-infectives (From admission, onward)   Start     Dose/Rate Route Frequency Ordered Stop   08/02/17 1800  metroNIDAZOLE (FLAGYL) IVPB 500 mg     500 mg 100 mL/hr over 60 Minutes Intravenous Every 8 hours 08/02/17 1747     08/02/17 1800  ciprofloxacin (CIPRO) IVPB 200 mg     200 mg 100 mL/hr over 60 Minutes Intravenous Every 12 hours 08/02/17 1747        Assessment/Plan:  improving diverticulitis May dc home no emergent surgical needs Elective sigmoidectomy as [place], MD, FACS  08/04/2017

## 2017-08-04 NOTE — Discharge Summary (Signed)
[name] at [facility] NAME: [name]    MR#:  [id number]  DATE OF BIRTH:  March 28, 1935  DATE OF ADMISSION:  08/02/2017 ADMITTING PHYSICIAN: Epifanio Lesches, MD  DATE OF DISCHARGE: 08/04/2017  PRIMARY CARE PHYSICIAN: Baxter Hire, MD    ADMISSION DIAGNOSIS:  divercticulitis   DISCHARGE DIAGNOSIS:  Recurrent sigmoid diverticulitis Severe diverticulosis sigmoid SECONDARY DIAGNOSIS:   Past Medical History:  Diagnosis Date  . Breast cancer ([place])   . Depression   . Diverticulosis   . GERD (gastroesophageal reflux disease)   . Hiatal hernia   . History of Bell's palsy   . History of colon polyps   . History of nephrolithiasis   . History of pancreatitis   . Hyperlipidemia   . IBS (irritable bowel syndrome)   . Osteoporosis   . Temporal arteritis ([place])   . Tubular adenoma of colon     HOSPITAL COURSE:   [name] a82 y.o.femalewith a known history of breast cancer, diverticulosis, hiatal hernia, Bell's palsy, nephrolithiasis, hyperlipidemia, irritable bowel syndrome, tubular adenoma of colon, temporal arteritis on chronic steroids came to the ER with abd pain and diarrhea  * Acute sigmoid diverticulitis--recurrent -recieved IV Cipro and Flagyl for now--change to oral  abxs - appreciate surgical consult as she may need surgical intervention--recommends elective sigmoid colon resection. Pt also advised second opinion with [facility] colorectal surgery -IV fluids and symptomatic supportive care.  * recurrent diarrhea This could be secondary to her diverticulitis, but she had been on antibiotics repeatedly-c diff negative  * temporal arteritis Patient takes prednisone at home--chronically. Currently on 5 mg qd  * anxiety Continue alprazolam as needed.  Overall improving.  Tolerating regular diet.  Had a BM.  Discharge to home okay from surgical standpoint with Dr. Dahlia Byes.  CONSULTS OBTAINED:   Treatment Team:  Jules Husbands, MD  DRUG ALLERGIES:   Allergies  Allergen Reactions  . Benzalkonium Chloride     Other reaction(s): Unknown  . Escitalopram     Deveioped a twitch  . Neomycin-Bacitracin Zn-Polymyx     Other reaction(s): Other (See Comments) REACTION: blisters skin REACTION: blisters skin    DISCHARGE MEDICATIONS:   Allergies as of 08/04/2017      Reactions   Benzalkonium Chloride    Other reaction(s): Unknown   Escitalopram    Deveioped a twitch   Neomycin-bacitracin Zn-polymyx    Other reaction(s): Other (See Comments) REACTION: blisters skin REACTION: blisters skin      Medication List    TAKE these medications   ALPRAZolam 0.25 MG tablet Commonly known as:  XANAX Take 0.25 mg by mouth 2 (two) times daily as needed.   aspirin EC 81 MG tablet Take 81 mg daily by mouth.   B-12 PO Take 2,500 mcg daily by mouth.   ciprofloxacin 500 MG tablet Commonly known as:  CIPRO Take 1 tablet (500 mg total) by mouth 2 (two) times daily for 8 days.   dicyclomine 20 MG tablet Commonly known as:  BENTYL Take 1 tablet (20 mg total) by mouth 3 (three) times daily before meals.   estradiol 1 MG tablet Commonly known as:  ESTRACE Take 1 mg daily by mouth.   feeding supplement (ENSURE ENLIVE) Liqd Take 237 mLs by mouth 2 (two) times daily between meals.   metroNIDAZOLE 500 MG tablet Commonly known as:  FLAGYL Take 1 tablet (500 mg total) by mouth every 8 (eight) hours for 8 days.   ondansetron 4  MG disintegrating tablet Commonly known as:  ZOFRAN ODT Take 1 tablet (4 mg total) every 8 (eight) hours as needed by mouth for nausea or vomiting.   predniSONE 5 MG tablet Commonly known as:  DELTASONE Take 5 mg by mouth daily.   traMADol 50 MG tablet Commonly known as:  ULTRAM Take 50 mg by mouth daily as needed. One tablet by mouth twice daily   Vitamin D3 2000 units capsule Take 2,000 Units daily by mouth.       If you experience worsening of  your admission symptoms, develop shortness of breath, life threatening emergency, suicidal or homicidal thoughts you must seek medical attention immediately by calling 911 or calling your MD immediately  if symptoms less severe.  You Must read complete instructions/literature along with all the possible adverse reactions/side effects for all the Medicines you take and that have been prescribed to you. Take any new Medicines after you have completely understood and accept all the possible adverse reactions/side effects.   Please note  You were cared for by a hospitalist during your hospital stay. If you have any questions about your discharge medications or the care you received while you were in the hospital after you are discharged, you can call the unit and asked to speak with the hospitalist on call if the hospitalist that took care of you is not available. Once you are discharged, your primary care physician will handle any further medical issues. Please note that NO REFILLS for any discharge medications will be authorized once you are discharged, as it is imperative that you return to your primary care physician (or establish a relationship with a primary care physician if you do not have one) for your aftercare needs so that they can reassess your need for medications and monitor your lab values. Today   SUBJECTIVE   No new complaints Had regular BM  VITAL SIGNS:  Blood pressure (!) 154/54, pulse 74, temperature 98.9 F (37.2 C), temperature source Oral, resp. rate 20, height 5\' 5"  (1.651 m), weight 73 kg (160 lb 15 oz), SpO2 98 %.  I/O:    Intake/Output Summary (Last 24 hours) at 08/04/2017 1254 Last data filed at 08/04/2017 1020 Gross per 24 hour  Intake 1729 ml  Output 800 ml  Net 929 ml    PHYSICAL EXAMINATION:  GENERAL:  83 y.o.-year-old patient lying in the bed with no acute distress.  EYES: Pupils equal, round, reactive to light and accommodation. No scleral icterus.  Extraocular muscles intact.  HEENT: Head atraumatic, normocephalic. Oropharynx and nasopharynx clear.  NECK:  Supple, no jugular venous distention. No thyroid enlargement, no tenderness.  LUNGS: Normal breath sounds bilaterally, no wheezing, rales,rhonchi or crepitation. No use of accessory muscles of respiration.  CARDIOVASCULAR: S1, S2 normal. No murmurs, rubs, or gallops.  ABDOMEN: Soft, non-tender, non-distended. Bowel sounds present. No organomegaly or mass.  EXTREMITIES: No pedal edema, cyanosis, or clubbing.  NEUROLOGIC: Cranial nerves II through XII are intact. Muscle strength 5/5 in all extremities. Sensation intact. Gait not checked.  PSYCHIATRIC: The patient is alert and oriented x 3.  SKIN: No obvious rash, lesion, or ulcer.   DATA REVIEW:   CBC  Recent Labs  Lab 08/03/17 0404  WBC 2.0*  HGB 12.5  HCT 38.0  PLT 208    Chemistries  Recent Labs  Lab 08/02/17 1758 08/03/17 0404  NA 139 143  K 4.0 3.6  CL 104 108  CO2 24 24  GLUCOSE 100* 79  BUN 9  10  CREATININE 0.96 0.90  CALCIUM 8.7* 8.2*  MG 2.2  --   AST 23  --   ALT 9*  --   ALKPHOS 62  --   BILITOT 1.0  --     Microbiology Results   Recent Results (from the past 240 hour(s))  C difficile quick scan w PCR reflex     Status: None   Collection Time: 08/02/17  8:03 PM  Result Value Ref Range Status   C Diff antigen NEGATIVE NEGATIVE Final   C Diff toxin NEGATIVE NEGATIVE Final   C Diff interpretation No C. difficile detected.  Final    Comment: Performed at [facility], [address] [place], [place] [address]    RADIOLOGY:  No results found.   Management plans discussed with the patient, family and they are in agreement.  CODE STATUS:     Code Status Orders  (From admission, onward)        Start     Ordered   08/02/17 1751  Full code  Continuous     08/02/17 1750    Code Status History    Date Active Date Inactive Code Status Order ID Comments User Context   06/14/2017  19:37 06/17/2017 18:12 Full Code [id number]  Dustin Flock, MD ED    Advance Directive Documentation     Most Recent Value  Type of Advance Directive  Healthcare Power of Attorney  Pre-existing out of facility DNR order (yellow form or pink MOST form)  No data  "MOST" Form in Place?  No data      TOTAL TIME TAKING CARE OF THIS PATIENT: *40* minutes.    Fritzi Mandes M.D on 08/04/2017 at 12:54 PM  Between 7am to 6pm - Pager - [phone] After 6pm go to [email or web address] - password [email or web address]  Office  [phone]  CC: Primary care physician; Baxter Hire, MD

## 2017-08-04 NOTE — Progress Notes (Signed)
Patient discharge teaching given, including activity, diet, follow-up appoints, and medications. Patient verbalized understanding of all discharge instructions. IV access was d/c'd. Vitals are stable. Skin is intact except as charted in most recent assessments. Pt to be escorted out by NT, to be driven home by family.  [name]  

## 2017-08-20 ENCOUNTER — Inpatient Hospital Stay: Payer: Medicare Other | Attending: Hematology and Oncology | Admitting: Hematology and Oncology

## 2017-08-20 ENCOUNTER — Other Ambulatory Visit: Payer: Self-pay

## 2017-08-20 ENCOUNTER — Encounter: Payer: Self-pay | Admitting: Hematology and Oncology

## 2017-08-20 VITALS — BP 152/67 | HR 92 | Temp 97.3°F | Resp 18 | Ht 64.57 in | Wt 156.4 lb

## 2017-08-20 DIAGNOSIS — D709 Neutropenia, unspecified: Secondary | ICD-10-CM | POA: Insufficient documentation

## 2017-08-20 DIAGNOSIS — Z79899 Other long term (current) drug therapy: Secondary | ICD-10-CM | POA: Diagnosis not present

## 2017-08-20 DIAGNOSIS — K5732 Diverticulitis of large intestine without perforation or abscess without bleeding: Secondary | ICD-10-CM

## 2017-08-20 DIAGNOSIS — Z800 Family history of malignant neoplasm of digestive organs: Secondary | ICD-10-CM

## 2017-08-20 DIAGNOSIS — Z87442 Personal history of urinary calculi: Secondary | ICD-10-CM | POA: Insufficient documentation

## 2017-08-20 DIAGNOSIS — R32 Unspecified urinary incontinence: Secondary | ICD-10-CM | POA: Diagnosis not present

## 2017-08-20 DIAGNOSIS — Z7952 Long term (current) use of systemic steroids: Secondary | ICD-10-CM | POA: Diagnosis not present

## 2017-08-20 DIAGNOSIS — Z85828 Personal history of other malignant neoplasm of skin: Secondary | ICD-10-CM | POA: Diagnosis not present

## 2017-08-20 DIAGNOSIS — Z87891 Personal history of nicotine dependence: Secondary | ICD-10-CM | POA: Insufficient documentation

## 2017-08-20 DIAGNOSIS — Z7982 Long term (current) use of aspirin: Secondary | ICD-10-CM | POA: Insufficient documentation

## 2017-08-20 DIAGNOSIS — Z86010 Personal history of colonic polyps: Secondary | ICD-10-CM | POA: Diagnosis not present

## 2017-08-20 DIAGNOSIS — Z853 Personal history of malignant neoplasm of breast: Secondary | ICD-10-CM | POA: Diagnosis not present

## 2017-08-20 DIAGNOSIS — D72819 Decreased white blood cell count, unspecified: Secondary | ICD-10-CM

## 2017-08-20 LAB — VITAMIN B12: Vitamin B-12: 3389 pg/mL — ABNORMAL HIGH (ref 180–914)

## 2017-08-20 LAB — CBC WITH DIFFERENTIAL/PLATELET
Basophils Absolute: 0 10*3/uL (ref 0–0.1)
Basophils Relative: 0 %
Eosinophils Absolute: 0 10*3/uL (ref 0–0.7)
Eosinophils Relative: 0 %
HEMATOCRIT: 40.8 % (ref 35.0–47.0)
HEMOGLOBIN: 13.3 g/dL (ref 12.0–16.0)
LYMPHS ABS: 0.5 10*3/uL — AB (ref 1.0–3.6)
LYMPHS PCT: 27 %
MCH: 29.9 pg (ref 26.0–34.0)
MCHC: 32.6 g/dL (ref 32.0–36.0)
MCV: 91.6 fL (ref 80.0–100.0)
Monocytes Absolute: 0.2 10*3/uL (ref 0.2–0.9)
Monocytes Relative: 9 %
NEUTROS ABS: 1.1 10*3/uL — AB (ref 1.4–6.5)
NEUTROS PCT: 64 %
Platelets: 221 10*3/uL (ref 150–440)
RBC: 4.45 MIL/uL (ref 3.80–5.20)
RDW: 16.1 % — ABNORMAL HIGH (ref 11.5–14.5)
WBC: 1.8 10*3/uL — AB (ref 3.6–11.0)

## 2017-08-20 LAB — TSH: TSH: 0.548 u[IU]/mL (ref 0.350–4.500)

## 2017-08-20 LAB — COMPREHENSIVE METABOLIC PANEL
ALK PHOS: 68 U/L (ref 38–126)
ALT: 12 U/L — ABNORMAL LOW (ref 14–54)
AST: 26 U/L (ref 15–41)
Albumin: 4 g/dL (ref 3.5–5.0)
Anion gap: 11 (ref 5–15)
BILIRUBIN TOTAL: 0.8 mg/dL (ref 0.3–1.2)
BUN: 10 mg/dL (ref 6–20)
CALCIUM: 8.8 mg/dL — AB (ref 8.9–10.3)
CO2: 25 mmol/L (ref 22–32)
Chloride: 100 mmol/L — ABNORMAL LOW (ref 101–111)
Creatinine, Ser: 0.87 mg/dL (ref 0.44–1.00)
Glucose, Bld: 103 mg/dL — ABNORMAL HIGH (ref 65–99)
Potassium: 3.8 mmol/L (ref 3.5–5.1)
Sodium: 136 mmol/L (ref 135–145)
Total Protein: 7.7 g/dL (ref 6.5–8.1)

## 2017-08-20 LAB — FOLATE: FOLATE: 12.1 ng/mL (ref >5.9)

## 2017-08-20 NOTE — Progress Notes (Signed)
[facility] day:  08/20/2017  Chief Complaint: [name] is a 83 y.o. female with neutropenia who is referred in consultation by Dr. Windell Moment for assessment and management.  HPI:   The patient presented to her PCP in 03/2017 for what she thought was a stomach virus. In December, patient was seen by her rheumatologist, where she was found to have an elevated WBC count.   She was admitted to [facility] from 06/14/2017 - 06/17/2017 with acute sigmoid diverticulitis with small abscess.  Abdomen and pelvic CT on 06/14/2017 revealed areas of segmental edema in both kidneys compatible with pyelonephritis.  There was no evidence for intrarenal abscess.  There was persistent edema/inflammation associated with the sigmoid colon superimposed on a background of diverticulosis. Imaging features remain compatible with diverticulitis.  There was a small para colonic abscess (9 x 20 mm, too small to percutaneously drain).  She was treated with Zosyn then discharged on Augmentin.    She was admitted to [facility] from 08/02/2017 - 08/04/2017 with diverticulitis.  She presented with abdominal pain and diarrhea. Abdominal and pelvic CT on 07/30/2017 revealed extensive sigmoid diverticulosis. Inflammatory stranding around the mid sigmoid colon was in a similar location to prior study compatible with diverticulitis. Previously seen pericolonic abscess was not visualized. She has a normal spleen.  She was treated with ciprofloxacin and Flagyl.    The patient is followed in the rheumatology clinic by Dr. Jefm Bryant for temporal arteritis.  She was diagnosed 17 years ago.  She presented with headache and temporal swelling.   She has been on long standing prednisone for over 16 years. She was on a higher doses (up to 80 mg/day).  She describes gaining 40 pounds and "swelling up".  She was on a slow taper (3 years to get to 20 mg/day). Her maintenance dose has been 2.5 mg a day.  Labs  are as follows: 08/30/2007:  Hematocrit 42.1, hemoglobin 14.3, MCV 90.3, platelets 302,000, WBC 6600 with an ANC of 5300. 08/12/2012:  Hematocrit 38.5, hemoglobin 12.9, MCV 94.0, platelets 237,000, WBC 5300 with an ANC of 3100. 04/16/2017:  Hematocrit 42.6, hemoglobin 14.0, MCV 91.6, platelets 215,000, WBC 3700 with an ANC of 2300. 06/14/2017:  Hematocrit 42.2, hemoglobin 13.9, MCV 90.1, platelets 240,000, WBC 2500. 07/24/2017:  Hematocrit 45.6, hemoglobin 14.5, MCV 93.1, platelets 246,000, WBC 1600 with an ANC of 880.  Diff:  56% segs, 32% lymphs, 11% monocytes, and 1.3% immature granulocytes. 08/02/2017:  Hematocrit 39.6, hemoglobin 12.8, MCV 91.4, platelets 265,000, WBC 1800. 08/03/2017:  Hematocrit 38.0, hemoglobin 12.5, MCV 91.3, platelets 208,000, WBC 2000.  Additional labs on 07/24/2017:  Creatinine 0.9, calcium 9.1, albumen 4.1, and normal LFTs.  Patient notes that she has chronically had an infection since 03/2017. She has had diverticulitis, vulvovaginal candidiasis, and a urinary tract infections. She has has a poor appetite recently. Patient is self limiting her diet citing that she is "afraid to eat" for fears of having diarrhea. Patient notes that as of Saturday, her bowel movements have been formed. She is having leakage at this point, which requires that she wear incontinence pads. She has only had a single bowel movement per day since Saturday. Prior to that, patient states, "I was going 24/7 it seemed. I couldn't hold it. It never stopped".   Patient is tired, and spends a great deal of her day sleeping. Patient has lost 20 pounds since 03/2017. Patient has constant rhinorrhea. She denies cold symptoms; no fevers, cough, or  shortness of breath. Patient has early morning arthritic symptoms. Patient takes Tramadol in the morning and "that works".   Last colonoscopy was "about a year ago" with Dr. Rayann Heman.   She is being considered for a bowel resection.  She denies any new medications  or herbal products.  She denies gingivitis.  She was diagnosed with bilateral DCIS in 1984 or 1985.  She underwent mastectomy.  She was on tamoxifen briefly.  She denies any family history of breast or ovarian cancer.  Her mother had colon cancer at the age of 70. Father passed away at age 34 from scleroderma. Brother died of post-surgical sepsis.    Past Medical History:  Diagnosis Date  . Breast cancer ([place])    1985  (bilateral mastectomy )  . Depression   . Diverticulosis   . GERD (gastroesophageal reflux disease)   . Hiatal hernia   . History of Bell's palsy   . History of colon polyps   . History of nephrolithiasis   . History of pancreatitis   . Hyperlipidemia   . IBS (irritable bowel syndrome)   . Osteoporosis   . Skin cancer   . Temporal arteritis ([place])   . Tubular adenoma of colon     Past Surgical History:  Procedure Laterality Date  . LAPAROSCOPIC CHOLECYSTECTOMY  2009  . MASTECTOMY  1983   bilateral    Family History  Problem Relation Age of Onset  . Colon cancer Mother 41  . Scleroderma Father 11  . Alcoholism Brother   . Stomach cancer Neg Hx     Social History:  reports that she quit smoking about 12 years ago. Her smoking use included cigarettes. She has a 7.50 pack-year smoking history. She has never used smokeless tobacco. She reports that she does not drink alcohol or use drugs. Patient smoked about 5 cigarettes a day for about 30 years; stopped in 2006. 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 Patient denies known exposures to radiation on toxins. Patient has 2 adult children. The patient is accompanied by her daughter [name]) today.  Allergies:  Allergies  Allergen Reactions  . Benzalkonium Chloride Dermatitis    Other reaction(s): Unknown  . Escitalopram     Deveioped a twitch  . Neomycin-Bacitracin Zn-Polymyx Dermatitis    Other reaction(s): Other (See Comments) REACTION: blisters skin REACTION: blisters skin REACTION:  blisters skin    Current Medications: Current Outpatient Medications  Medication Sig Dispense Refill  . ALPRAZolam (XANAX) 0.25 MG tablet Take 0.25 mg by mouth 2 (two) times daily as needed.     Marland Kitchen aspirin EC 81 MG tablet Take 81 mg daily by mouth.    . Biotin 1000 MCG tablet Take by mouth.    . Cholecalciferol (VITAMIN D3) 2000 units capsule Take 2,000 Units daily by mouth.     . Cyanocobalamin (B-12 PO) Take 2,500 mcg daily by mouth.     . estradiol (ESTRACE) 1 MG tablet Take 1 mg daily by mouth.   0  . feeding supplement, ENSURE ENLIVE, (ENSURE ENLIVE) LIQD Take 237 mLs by mouth 2 (two) times daily between meals. 237 mL 12  . fluticasone (FLONASE) 50 MCG/ACT nasal spray Place into the nose.    . medroxyPROGESTERone (PROVERA) 2.5 MG tablet Take 2.5 mg by mouth daily.     . predniSONE (DELTASONE) 5 MG tablet Take 5 mg by mouth daily.     . traMADol (ULTRAM) 50 MG tablet Take 50 mg by mouth daily as needed. One tablet  by mouth twice daily     . dicyclomine (BENTYL) 20 MG tablet Take 1 tablet (20 mg total) by mouth 3 (three) times daily before meals. (Patient not taking: Reported on 08/20/2017) 90 tablet 3  . ondansetron (ZOFRAN ODT) 4 MG disintegrating tablet Take 1 tablet (4 mg total) every 8 (eight) hours as needed by mouth for nausea or vomiting. (Patient not taking: Reported on 06/14/2017) 20 tablet 0   No current facility-administered medications for this visit.     Review of Systems:  GENERAL:  Feels tired.  Sleeps a lot.  No fevers or sweats. 20 pound weight loss since 03/2017. PERFORMANCE STATUS (ECOG):  2 HEENT:  Chronic rhinorrhea. Vision improving.  No visual changes, sore throat, mouth sores or tenderness. Lungs: No shortness of breath or cough.  No hemoptysis. Cardiac:  No chest pain, palpitations, orthopnea, or PND. GI:  Diarrhea; resolving (see HPI). Rare LLQ pain (sharp).  No nausea, vomiting, constipation, melena or hematochezia. GU:  No urgency, frequency, dysuria, or  hematuria. Musculoskeletal:  Early morning arthritic pains. No back pain.  No muscle tenderness.  Leakage. Extremities:  No pain or swelling. Skin:  No rashes or skin changes. Neuro:  No headache, numbness or weakness, balance or coordination issues. Endocrine:  No diabetes, thyroid issues, hot flashes or night sweats. Psych:  No mood changes, depression or anxiety.  Does not move in sleep then "hard to open hands". Pain:  3/10 - abdomen. Review of systems:  All other systems reviewed and found to be negative.  Physical Exam: Blood pressure (!) 152/67, pulse 92, temperature (!) 97.3 F (36.3 C), temperature source Tympanic, resp. rate 18, height 5' 4.57" (1.64 m), weight 156 lb 6.4 oz (70.9 kg). GENERAL:  Well developed, well nourished, elderly woman sitting comfortably in the exam room in no acute distress. MENTAL STATUS:  Alert and oriented to person, place and time. HEAD:  Short blonde hair.  Normocephalic, atraumatic, face symmetric, no Cushingoid features. EYES:  Blue eyes.  Pupils equal round and reactive to light and accomodation.  No conjunctivitis or scleral icterus. ENT:  Oropharynx clear without lesion.  Tongue normal. Mucous membranes moist.  RESPIRATORY:  Clear to auscultation without rales, wheezes or rhonchi. CARDIOVASCULAR:  Regular rate and rhythm without murmur, rub or gallop. ABDOMEN:  Soft, non-tender, with active bowel sounds, and no hepatosplenomegaly.  No masses. SKIN:  No rashes, ulcers or lesions. EXTREMITIES: No edema, no skin discoloration or tenderness.  No palpable cords. LYMPH NODES: No palpable cervical, supraclavicular, axillary or inguinal adenopathy  NEUROLOGICAL: Unremarkable. PSYCH:  Appropriate.   No visits with results within 3 Day(s) from this visit.  Latest known visit with results is:  Admission on 08/02/2017, Discharged on 08/04/2017  Component Date Value Ref Range Status  . WBC 08/02/2017 1.8* 3.6 - 11.0 K/uL Final  . RBC 08/02/2017 4.33   3.80 - 5.20 MIL/uL Final  . Hemoglobin 08/02/2017 12.8  12.0 - 16.0 g/dL Final  . HCT 08/02/2017 39.6  35.0 - 47.0 % Final  . MCV 08/02/2017 91.4  80.0 - 100.0 fL Final  . MCH 08/02/2017 29.5  26.0 - 34.0 pg Final  . MCHC 08/02/2017 32.3  32.0 - 36.0 g/dL Final  . RDW 08/02/2017 16.1* 11.5 - 14.5 % Final  . Platelets 08/02/2017 265  150 - 440 K/uL Final   Performed at [facility], [address] [place], [place] [address]  . Sodium 08/02/2017 139  135 - 145 mmol/L Final  . Potassium  08/02/2017 4.0  3.5 - 5.1 mmol/L Final  . Chloride 08/02/2017 104  101 - 111 mmol/L Final  . CO2 08/02/2017 24  22 - 32 mmol/L Final  . Glucose, Bld 08/02/2017 100* 65 - 99 mg/dL Final  . BUN 08/02/2017 9  6 - 20 mg/dL Final  . Creatinine, Ser 08/02/2017 0.96  0.44 - 1.00 mg/dL Final  . Calcium 08/02/2017 8.7* 8.9 - 10.3 mg/dL Final  . Total Protein 08/02/2017 7.0  6.5 - 8.1 g/dL Final  . Albumin 08/02/2017 3.5  3.5 - 5.0 g/dL Final  . AST 08/02/2017 23  15 - 41 U/L Final  . ALT 08/02/2017 9* 14 - 54 U/L Final  . Alkaline Phosphatase 08/02/2017 62  38 - 126 U/L Final  . Total Bilirubin 08/02/2017 1.0  0.3 - 1.2 mg/dL Final  . GFR calc non Af Amer 08/02/2017 54* >60 mL/min Final  . GFR calc Af Amer 08/02/2017 >60  >60 mL/min Final   Comment: (NOTE) The eGFR has been calculated using the CKD EPI equation. This calculation has not been validated in all clinical situations. eGFR's persistently <60 mL/min signify possible Chronic Kidney Disease.   Georgiann Hahn gap 08/02/2017 11  5 - 15 Final   Performed at [facility], [address] [place], [place] [address]  . C Diff antigen 08/02/2017 NEGATIVE  NEGATIVE Final  . C Diff toxin 08/02/2017 NEGATIVE  NEGATIVE Final  . C Diff interpretation 08/02/2017 No C. difficile detected.   Final   Performed at [facility], [address], [place], [place] [address]  . Magnesium 08/02/2017 2.2  1.7 - 2.4 mg/dL Final   Performed at [facility], [address] [place], [place] [address]  . Sodium 08/03/2017 143  135 - 145 mmol/L Final  . Potassium 08/03/2017 3.6  3.5 - 5.1 mmol/L Final  . Chloride 08/03/2017 108  101 - 111 mmol/L Final  . CO2 08/03/2017 24  22 - 32 mmol/L Final  . Glucose, Bld 08/03/2017 79  65 - 99 mg/dL Final  . BUN 08/03/2017 10  6 - 20 mg/dL Final  . Creatinine, Ser 08/03/2017 0.90  0.44 - 1.00 mg/dL Final  . Calcium 08/03/2017 8.2* 8.9 - 10.3 mg/dL Final  . GFR calc non Af Amer 08/03/2017 59* >60 mL/min Final  . GFR calc Af Amer 08/03/2017 >60  >60 mL/min Final   Comment: (NOTE) The eGFR has been calculated using the CKD EPI equation. This calculation has not been validated in all clinical situations. eGFR's persistently <60 mL/min signify possible Chronic Kidney Disease.   Georgiann Hahn gap 08/03/2017 11  5 - 15 Final   Performed at [facility], [address] [place], [place] [address]  . WBC 08/03/2017 2.0* 3.6 - 11.0 K/uL Final  . RBC 08/03/2017 4.16  3.80 - 5.20 MIL/uL Final  . Hemoglobin 08/03/2017 12.5  12.0 - 16.0 g/dL Final  . HCT 08/03/2017 38.0  35.0 - 47.0 % Final  . MCV 08/03/2017 91.3  80.0 - 100.0 fL Final  . MCH 08/03/2017 30.1  26.0 - 34.0 pg Final  . MCHC 08/03/2017 32.9  32.0 - 36.0 g/dL Final  . RDW 08/03/2017 15.5* 11.5 - 14.5 % Final  . Platelets 08/03/2017 208  150 - 440 K/uL Final   Performed at [facility], [address] [place], [place] [address]    Assessment:  QUEENA MONRREAL is a 83 y.o. female with progressive leukopenia since 03/2017.  She has had ongoing issues with diverticulitis and has been on multiple rounds of antibiotics.  She is being considered for a bowel resection.  She denies any new medications or herbal products.  She was diagnosed with temporal arteritis 17 years ago.  She has been on chronic prednisone.  Her current maintenance dose is 2.5 mg a day.  Symptomatically, she is fatigued.  She has lost 20 pounds since  03/2017.  Exam is unremarkable.  Plan: 1.  Discuss diagnosis and work-up of leukopenia.  Etiology may be secondary to infection and age (poor marrow reserve).  Diet has been poor, thus consider dietary issues or possible marrow replacement processes.  She has never been transfused and denies any history of hepatitis or HIV disease.  Patient has an underlying autoimmune disorder. 2.  Labs today: CBC with diff, CMP, myeloma panel, copper, B12, folate, TSH, ANA with reflex, HBV sAg, HBV cAb, HCV Ab, and HIV testing. Patient provides verbal consent for hepatitis and HIV testing.  3.  Peripheral smear for path review. 4.  Discuss potential need for bone marrow testing if labs workup inconclusive.  5.  RTC in 1 week for MD assessment, review of workup, and discussion regarding direction of therapy.    Honor Loh, NP  08/20/2017, 4:21 PM   I saw and evaluated the patient, participating in the key portions of the service and reviewing pertinent diagnostic studies and records.  I reviewed the nurse practitioner's note and agree with the findings and the plan.  The assessment and plan were discussed with the patient.  Multiple questions were asked by the patient and answered.   Nolon Stalls, MD 08/20/2017, 4:21 PM

## 2017-08-20 NOTE — Progress Notes (Signed)
Here for new pt evaluation accomp by daughter .

## 2017-08-21 ENCOUNTER — Telehealth: Payer: Self-pay | Admitting: *Deleted

## 2017-08-21 LAB — PATHOLOGIST SMEAR REVIEW

## 2017-08-21 LAB — HEPATITIS B CORE ANTIBODY, TOTAL: HEP B C TOTAL AB: NEGATIVE

## 2017-08-21 LAB — HEPATITIS C ANTIBODY: HCV Ab: 0.1 s/co ratio (ref 0.0–0.9)

## 2017-08-21 LAB — HEPATITIS B SURFACE ANTIGEN: HEP B S AG: NEGATIVE

## 2017-08-21 NOTE — Telephone Encounter (Signed)
Called patient to inform her that her B-12 levels are very high.  MD wants her to stop all oral supplements until further notice.  Patient understands.

## 2017-08-21 NOTE — Telephone Encounter (Signed)
-----   Message from Karen Kitchens, NP sent at 08/21/2017  1:12 PM EDT ----- B12 is very high. Stop oral supplement under further directed.   Gaspar Bidding  ----- Message ----- From: Buel Ream, [facility] [place] Sent: 08/20/2017   5:53 PM To: Karen Kitchens, NP

## 2017-08-22 LAB — MULTIPLE MYELOMA PANEL, SERUM
ALBUMIN/GLOB SERPL: 1 (ref 0.7–1.7)
ALPHA 1: 0.3 g/dL (ref 0.0–0.4)
Albumin SerPl Elph-Mcnc: 3.3 g/dL (ref 2.9–4.4)
Alpha2 Glob SerPl Elph-Mcnc: 0.9 g/dL (ref 0.4–1.0)
B-GLOBULIN SERPL ELPH-MCNC: 1.1 g/dL (ref 0.7–1.3)
GLOBULIN, TOTAL: 3.6 g/dL (ref 2.2–3.9)
Gamma Glob SerPl Elph-Mcnc: 1.2 g/dL (ref 0.4–1.8)
IgA: 285 mg/dL (ref 64–422)
IgG (Immunoglobin G), Serum: 1080 mg/dL (ref 700–1600)
IgM (Immunoglobulin M), Srm: 158 mg/dL (ref 26–217)
Total Protein ELP: 6.9 g/dL (ref 6.0–8.5)

## 2017-08-23 LAB — HIV ANTIBODY (ROUTINE TESTING W REFLEX): HIV SCREEN 4TH GENERATION: NONREACTIVE

## 2017-08-23 LAB — ANA W/REFLEX: ANA: NEGATIVE

## 2017-08-23 LAB — COPPER, SERUM: Copper: 137 ug/dL (ref 72–166)

## 2017-08-27 ENCOUNTER — Encounter: Payer: Self-pay | Admitting: Hematology and Oncology

## 2017-08-27 ENCOUNTER — Inpatient Hospital Stay: Payer: Medicare Other | Attending: Hematology and Oncology | Admitting: Hematology and Oncology

## 2017-08-27 VITALS — BP 149/83 | HR 97 | Temp 97.8°F | Resp 20 | Wt 154.6 lb

## 2017-08-27 DIAGNOSIS — Z800 Family history of malignant neoplasm of digestive organs: Secondary | ICD-10-CM | POA: Insufficient documentation

## 2017-08-27 DIAGNOSIS — Z7952 Long term (current) use of systemic steroids: Secondary | ICD-10-CM | POA: Insufficient documentation

## 2017-08-27 DIAGNOSIS — R21 Rash and other nonspecific skin eruption: Secondary | ICD-10-CM | POA: Insufficient documentation

## 2017-08-27 DIAGNOSIS — Z7982 Long term (current) use of aspirin: Secondary | ICD-10-CM | POA: Diagnosis not present

## 2017-08-27 DIAGNOSIS — D72819 Decreased white blood cell count, unspecified: Secondary | ICD-10-CM | POA: Diagnosis not present

## 2017-08-27 DIAGNOSIS — Z79899 Other long term (current) drug therapy: Secondary | ICD-10-CM | POA: Insufficient documentation

## 2017-08-27 DIAGNOSIS — R5383 Other fatigue: Secondary | ICD-10-CM | POA: Insufficient documentation

## 2017-08-27 DIAGNOSIS — D709 Neutropenia, unspecified: Secondary | ICD-10-CM | POA: Diagnosis present

## 2017-08-27 DIAGNOSIS — Z85828 Personal history of other malignant neoplasm of skin: Secondary | ICD-10-CM | POA: Insufficient documentation

## 2017-08-27 DIAGNOSIS — Z853 Personal history of malignant neoplasm of breast: Secondary | ICD-10-CM | POA: Diagnosis not present

## 2017-08-27 DIAGNOSIS — Z87891 Personal history of nicotine dependence: Secondary | ICD-10-CM

## 2017-08-27 DIAGNOSIS — R634 Abnormal weight loss: Secondary | ICD-10-CM | POA: Diagnosis not present

## 2017-08-27 NOTE — Progress Notes (Signed)
[facility] day:  08/27/2017  Chief Complaint: [name] is a 83 y.o. female with neutropenia who is seen for review of work-up and discussion regarding direction of therapy.  HPI:   The patient was last seen in the hematology clinic on 08/20/2017 for initial consultation. She had progressive leukopenia since 03/2017.  She had ongoing issues with diverticulitis and had been on multiple rounds of antibiotics.  She was being considered for a bowel resection.  She denied any new medications or herbal products.  She was on chronic low dose prednisone for temporal arteritis.  She underwent a work-up.  CBC revealed a hematocrit of 40.8, hemoglobin 13.3, MCV 91.6, platelets 221,000, WBC 1800 with an ANC of 1100.  CMP was unremarkable.  ANA was negative.  HIV antibody, hepatitis C antibody, hepatitis B core antibody total, and hepatitis B surface antigen were normal.  Folate and copper were normal.  B12 was 3389 (high).  SPEP revealed no monoclonal protein. TSH was normal.  Peripheral smear revealed leukopenia with absolute lymphopenia and neutropenia.  WBC morphology was unremarkable.  RBC and platelet morphology were unremarkable.  During the interim, patient has done well. She has a diffuse rash to her torso. Rash is dry. She denies new medications, soaps, cosmetics, foods, and medications.  Patient denies bruising and bleeding. Patient has no B symptoms. Her bowel movements are daily at this point, and she notes that her stools are formed. Patient denies bleeding; no hematochezia, melena, or gross hematuria.   Patient is eating "better". She has stopped her Ensure shakes. Her weight has decreased by 2 pounds. Patient complains of pain rated 2/10.    Past Medical History:  Diagnosis Date  . Breast cancer ([place])    1985  (bilateral mastectomy )  . Depression   . Diverticulosis   . GERD (gastroesophageal reflux disease)   . Hiatal hernia   .  History of Bell's palsy   . History of colon polyps   . History of nephrolithiasis   . History of pancreatitis   . Hyperlipidemia   . IBS (irritable bowel syndrome)   . Osteoporosis   . Skin cancer   . Temporal arteritis ([place])   . Tubular adenoma of colon     Past Surgical History:  Procedure Laterality Date  . LAPAROSCOPIC CHOLECYSTECTOMY  2009  . MASTECTOMY  1983   bilateral    Family History  Problem Relation Age of Onset  . Colon cancer Mother 69  . Scleroderma Father 20  . Alcoholism Brother   . Stomach cancer Neg Hx     Social History:  reports that she quit smoking about 12 years ago. Her smoking use included cigarettes. She has a 7.50 pack-year smoking history. She has never used smokeless tobacco. She reports that she does not drink alcohol or use drugs. Patient smoked about 5 cigarettes a day for about 30 years; stopped in 2006. 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 Patient denies known exposures to radiation on toxins. Patient has 2 adult children. The patient is accompanied by her daughter Levada Dy) today.  Allergies:  Allergies  Allergen Reactions  . Benzalkonium Chloride Dermatitis    Other reaction(s): Unknown  . Escitalopram     Deveioped a twitch  . Neomycin-Bacitracin Zn-Polymyx Dermatitis    Other reaction(s): Other (See Comments) REACTION: blisters skin REACTION: blisters skin REACTION: blisters skin    Current Medications: Current Outpatient Medications  Medication Sig Dispense Refill  .  ALPRAZolam (XANAX) 0.25 MG tablet Take 0.25 mg by mouth 2 (two) times daily as needed.     Marland Kitchen aspirin EC 81 MG tablet Take 81 mg daily by mouth.    . Biotin 1000 MCG tablet Take by mouth.    . Cholecalciferol (VITAMIN D3) 2000 units capsule Take 2,000 Units daily by mouth.     . Cyanocobalamin (B-12 PO) Take 2,500 mcg daily by mouth.     . dicyclomine (BENTYL) 20 MG tablet Take 1 tablet (20 mg total) by mouth 3 (three) times daily before meals.  90 tablet 3  . estradiol (ESTRACE) 1 MG tablet Take 1 mg daily by mouth.   0  . feeding supplement, ENSURE ENLIVE, (ENSURE ENLIVE) LIQD Take 237 mLs by mouth 2 (two) times daily between meals. 237 mL 12  . fluticasone (FLONASE) 50 MCG/ACT nasal spray Place into the nose.    . medroxyPROGESTERone (PROVERA) 2.5 MG tablet Take 2.5 mg by mouth daily.     . predniSONE (DELTASONE) 5 MG tablet Take 5 mg by mouth daily.     . traMADol (ULTRAM) 50 MG tablet Take 50 mg by mouth daily as needed. One tablet by mouth twice daily     . ondansetron (ZOFRAN ODT) 4 MG disintegrating tablet Take 1 tablet (4 mg total) every 8 (eight) hours as needed by mouth for nausea or vomiting. (Patient not taking: Reported on 06/14/2017) 20 tablet 0   No current facility-administered medications for this visit.     Review of Systems:  GENERAL:  Feels tired.  Sleeps a lot.  No fevers or sweats. 22 pound weight loss since 03/2017. Weight down 2 pounds since last visit.  PERFORMANCE STATUS (ECOG):  2 HEENT:  Chronic rhinorrhea. Vision improving.  No visual changes, sore throat, mouth sores or tenderness. Lungs: No shortness of breath or cough.  No hemoptysis. Cardiac:  No chest pain, palpitations, orthopnea, or PND. GI:  Diarrhea; resolving. Rare LLQ pain (sharp).  No nausea, vomiting, constipation, melena or hematochezia. GU:  No urgency, frequency, dysuria, or hematuria. Musculoskeletal:  Early morning arthritic pains. No back pain.  No muscle tenderness.  Leakage. Extremities:  No pain or swelling. Skin:  New maculopapular rash (diffuse).  Neuro:  No headache, numbness or weakness, balance or coordination issues. Endocrine:  No diabetes, thyroid issues, hot flashes or night sweats. Psych:  No mood changes, depression or anxiety.  Does not move in sleep then "hard to open hands". Pain:  2/10 - abdomen. Review of systems:  All other systems reviewed and found to be negative.  Physical Exam: Blood pressure (!) 149/83,  pulse 97, temperature 97.8 F (36.6 C), temperature source Tympanic, resp. rate 20, weight 154 lb 9 oz (70.1 kg). GENERAL:  Well developed, well nourished, elderly woman sitting comfortably in the exam room in no acute distress. MENTAL STATUS:  Alert and oriented to person, place and time. HEAD:  Short blonde hair.  Normocephalic, atraumatic, face symmetric, no Cushingoid features. EYES:  Blue eyes.  No conjunctivitis or scleral icterus. RESPIRATORY:  Clear to auscultation without rales, wheezes or rhonchi. CARDIOVASCULAR:  Regular rate and rhythm without murmur, rub or gallop. ABDOMEN:  Soft, non-tender, with active bowel sounds, and no hepatosplenomegaly.  No masses. SKIN:  Diffuse dry maculopapular rash. EXTREMITIES: No edema, no skin discoloration or tenderness.  No palpable cords. NEUROLOGICAL: Unremarkable. PSYCH:  Appropriate.   No visits with results within 3 Day(s) from this visit.  Latest known visit with results is:  Office  Visit on 08/20/2017  Component Date Value Ref Range Status  . WBC 08/20/2017 1.8* 3.6 - 11.0 K/uL Final  . RBC 08/20/2017 4.45  3.80 - 5.20 MIL/uL Final  . Hemoglobin 08/20/2017 13.3  12.0 - 16.0 g/dL Final  . HCT 08/20/2017 40.8  35.0 - 47.0 % Final  . MCV 08/20/2017 91.6  80.0 - 100.0 fL Final  . MCH 08/20/2017 29.9  26.0 - 34.0 pg Final  . MCHC 08/20/2017 32.6  32.0 - 36.0 g/dL Final  . RDW 08/20/2017 16.1* 11.5 - 14.5 % Final  . Platelets 08/20/2017 221  150 - 440 K/uL Final  . Neutrophils Relative % 08/20/2017 64  % Final  . Neutro Abs 08/20/2017 1.1* 1.4 - 6.5 K/uL Final  . Lymphocytes Relative 08/20/2017 27  % Final  . Lymphs Abs 08/20/2017 0.5* 1.0 - 3.6 K/uL Final  . Monocytes Relative 08/20/2017 9  % Final  . Monocytes Absolute 08/20/2017 0.2  0.2 - 0.9 K/uL Final  . Eosinophils Relative 08/20/2017 0  % Final  . Eosinophils Absolute 08/20/2017 0.0  0 - 0.7 K/uL Final  . Basophils Relative 08/20/2017 0  % Final  . Basophils Absolute  08/20/2017 0.0  0 - 0.1 K/uL Final   Performed at [facility], [address] [place], [place] [address]  . Sodium 08/20/2017 136  135 - 145 mmol/L Final  . Potassium 08/20/2017 3.8  3.5 - 5.1 mmol/L Final  . Chloride 08/20/2017 100* 101 - 111 mmol/L Final  . CO2 08/20/2017 25  22 - 32 mmol/L Final  . Glucose, Bld 08/20/2017 103* 65 - 99 mg/dL Final  . BUN 08/20/2017 10  6 - 20 mg/dL Final  . Creatinine, Ser 08/20/2017 0.87  0.44 - 1.00 mg/dL Final  . Calcium 08/20/2017 8.8* 8.9 - 10.3 mg/dL Final  . Total Protein 08/20/2017 7.7  6.5 - 8.1 g/dL Final  . Albumin 08/20/2017 4.0  3.5 - 5.0 g/dL Final  . AST 08/20/2017 26  15 - 41 U/L Final  . ALT 08/20/2017 12* 14 - 54 U/L Final  . Alkaline Phosphatase 08/20/2017 68  38 - 126 U/L Final  . Total Bilirubin 08/20/2017 0.8  0.3 - 1.2 mg/dL Final  . GFR calc non Af Amer 08/20/2017 >60  >60 mL/min Final  . GFR calc Af Amer 08/20/2017 >60  >60 mL/min Final   Comment: (NOTE) The eGFR has been calculated using the CKD EPI equation. This calculation has not been validated in all clinical situations. eGFR's persistently <60 mL/min signify possible Chronic Kidney Disease.   Georgiann Hahn gap 08/20/2017 11  5 - 15 Final   Performed at [facility], [address] [place], [place] [address]  . Copper 08/20/2017 137  72 - 166 ug/dL Final   Comment: (NOTE) This test was developed and its performance characteristics determined by [facility]. It has not been cleared or approved by the Food and Drug Administration.                                Detection Limit = 5 Performed At: [facility] [place], [place] [id number] Rush Farmer MD GQ:[id number] Performed at [facility], [address], [place], [place] [address]   . Folate 08/20/2017 12.1  >5.9 ng/mL Final   Performed at [facility], [address] [place], [place] [address]  . Hep B Core Total Ab 08/20/2017 Negative  Negative Final    Comment: (NOTE) Performed At: [facility] [place], [place] [id number] Rush Farmer MD [id number] Performed at [facility], [address], [place], [place] [address]   . Hepatitis B Surface Ag 08/20/2017 Negative  Negative Final   Comment: (NOTE) Performed At: [facility] [place], [place] [id number] Rush Farmer MD XI:[id number] Performed at [facility], [address], [place], [place] [address]   . HCV Ab 08/20/2017 0.1  0.0 - 0.9 s/co ratio Final   Comment: (NOTE)                                  Negative:     < 0.8                             Indeterminate: 0.8 - 0.9                                  Positive:     > 0.9 The CDC recommends that a positive HCV antibody result be followed up with a HCV Nucleic Acid Amplification test (937902). Performed At: [facility] [place], [place] [id number] Rush Farmer MD [id number] Performed at [facility], [address], [place], [place] [address]   . Vitamin B-12 08/20/2017 3,389* 180 - 914 pg/mL Final   Comment: (NOTE) This assay is not validated for testing neonatal or myeloproliferative syndrome specimens for Vitamin B12 levels. Performed at [facility], [place] [address] [place], [place] [address]   . Path Review 08/20/2017 Peripheral blood smear reviewed per clinician request.   Final   Comment: 83 year old patient undergoing evaluation for leukopenia and recurrent infections. Leukopenia with absolute lymphopenia and neutropenia. Unremarkable WBC morphology. Unremarkable platelet and RBC morphology. Dr. Mike Gip has ordered serologic tests for further evaluation and those results are pending. Reviewed by Dellia Nims Reuel Derby, M.D. Performed at [facility], [address], [place], [place] [address]   . Anit Nuclear Antibody(ANA) 08/20/2017 Negative  Negative Final   Comment: (NOTE) Performed At: [facility] [place], [place] [id number] Rush Farmer MD UD:[id number] Performed at [facility], [address] [place], [place] [address]   . IgG (Immunoglobin G), Serum 08/20/2017 1,080  700 - 1,600 mg/dL Final  . IgA 08/20/2017 285  64 - 422 mg/dL Final  . IgM (Immunoglobulin M), Srm 08/20/2017 158  26 - 217 mg/dL Final  . Total Protein ELP 08/20/2017 6.9  6.0 - 8.5 g/dL Corrected  . Albumin SerPl Elph-Mcnc 08/20/2017 3.3  2.9 - 4.4 g/dL Corrected  . Alpha 1 08/20/2017 0.3  0.0 - 0.4 g/dL Corrected  . Alpha2 Glob SerPl Elph-Mcnc 08/20/2017 0.9  0.4 - 1.0 g/dL Corrected  . B-Globulin SerPl Elph-Mcnc 08/20/2017 1.1  0.7 - 1.3 g/dL Corrected  . Gamma Glob SerPl Elph-Mcnc 08/20/2017 1.2  0.4 - 1.8 g/dL Corrected  . M Protein SerPl Elph-Mcnc 08/20/2017 Not Observed  Not Observed g/dL Corrected  . Globulin, Total 08/20/2017 3.6  2.2 - 3.9 g/dL Corrected  . Albumin/Glob SerPl 08/20/2017 1.0  0.7 - 1.7 Corrected  . IFE 1 08/20/2017 Comment   Corrected   An apparent normal immunofixation pattern.  . Please Note 08/20/2017 Comment   Corrected  Comment: (NOTE) Protein electrophoresis scan will follow via computer, mail, or courier delivery. Performed At: [facility] [place], [place] [id number] Rush Farmer MD [id number] Performed at [facility], [address], [place], [place] [address]   . HIV Screen 4th Generation wRfx 08/20/2017 Non Reactive  Non Reactive Final   Comment: (NOTE) Performed At: [facility] [place], [place] [id number] Rush Farmer MD YO:[id number] Performed at [facility], [address], [place], [place] [address]   . TSH 08/20/2017 0.548  0.350 - 4.500 uIU/mL Final   Comment: Performed by a 3rd Generation assay with a functional sensitivity of <=0.01 uIU/mL. Performed at [facility], [address] [place],  [address]     Assessment:   [name] is a 83 y.o. female with progressive leukopenia since 03/2017.  She has had ongoing issues with diverticulitis and has been on multiple rounds of antibiotics.  She is being considered for a bowel resection.  She denies any new medications or herbal products.  Work-up on 08/20/2017 revealed a hematocrit of 40.8, hemoglobin 13.3, MCV 91.6, platelets 221,000, WBC 1800 with an ANC of 1100.  CMP was unremarkable.  ANA was negative.  HIV antibody, hepatitis C antibody, hepatitis B core antibody total, and hepatitis B surface antigen were normal.  Folate and copper were normal.  B12 was 3389 (high).  SPEP revealed no monoclonal protein. TSH was normal.  She was diagnosed with temporal arteritis 17 years ago.  She has been on chronic prednisone.  Her current maintenance dose is 2.5 mg a day.  Symptomatically, she is fatigued.  Patient has a diffuse rash to her torso. She has lost 22 pounds since 03/2017 lost and 2 pounds since her last visit.  Exam reveals a diffuse maculopapular rash.   Plan: 1.  Review work-up.  Etiology of leukopenia unclear. 2.  Discuss bone marrow to further evaluate leukopenia.  3.  Discuss diffuse rash. Patient will need skin biopsy. Sees Dr. Evorn Gong. Appointment scheduled on 08/28/2017 at 1630.  4.  RTC 1 week after bone marrow for MD assessment and review of workup.    Honor Loh, NP  08/27/2017, 11:31 AM   I saw and evaluated the patient, participating in the key portions of the service and reviewing pertinent diagnostic studies and records.  I reviewed the nurse practitioner's note and agree with the findings and the plan.  The assessment and plan were discussed with the patient.  Multiple questions were asked by the patient and answered.   Nolon Stalls, MD 08/27/2017, 11:31 AM

## 2017-08-27 NOTE — Progress Notes (Signed)
Pt in for follow up, reports having a rash for 10 days extends under both arms and breasts.

## 2017-09-02 DIAGNOSIS — D72819 Decreased white blood cell count, unspecified: Secondary | ICD-10-CM | POA: Insufficient documentation

## 2017-09-02 DIAGNOSIS — R21 Rash and other nonspecific skin eruption: Secondary | ICD-10-CM | POA: Insufficient documentation

## 2017-09-03 ENCOUNTER — Other Ambulatory Visit: Payer: Self-pay | Admitting: Radiology

## 2017-09-04 ENCOUNTER — Ambulatory Visit
Admission: RE | Admit: 2017-09-04 | Discharge: 2017-09-04 | Disposition: A | Discharge status: Home / Self Care | Payer: Medicare Other | Source: Ambulatory Visit | Attending: Urgent Care | Admitting: Urgent Care

## 2017-09-04 ENCOUNTER — Other Ambulatory Visit (HOSPITAL_COMMUNITY)
Admission: RE | Admit: 2017-09-04 | Disposition: A | Discharge status: Home / Self Care | Payer: Medicare Other | Source: Ambulatory Visit | Attending: Hematology and Oncology | Admitting: Hematology and Oncology

## 2017-09-04 DIAGNOSIS — D72819 Decreased white blood cell count, unspecified: Secondary | ICD-10-CM | POA: Diagnosis not present

## 2017-09-04 LAB — CBC WITH DIFFERENTIAL/PLATELET
BASOS ABS: 0 10*3/uL (ref 0–0.1)
Basophils Relative: 1 %
EOS ABS: 0 10*3/uL (ref 0–0.7)
Eosinophils Relative: 1 %
HCT: 41.9 % (ref 35.0–47.0)
HEMOGLOBIN: 13.6 g/dL (ref 12.0–16.0)
LYMPHS ABS: 1 10*3/uL (ref 1.0–3.6)
LYMPHS PCT: 40 %
MCH: 29.6 pg (ref 26.0–34.0)
MCHC: 32.4 g/dL (ref 32.0–36.0)
MCV: 91.2 fL (ref 80.0–100.0)
Monocytes Absolute: 0.4 10*3/uL (ref 0.2–0.9)
Monocytes Relative: 14 %
NEUTROS PCT: 44 %
Neutro Abs: 1.1 10*3/uL — ABNORMAL LOW (ref 1.4–6.5)
Platelets: 236 10*3/uL (ref 150–440)
RBC: 4.59 MIL/uL (ref 3.80–5.20)
RDW: 15.7 % — ABNORMAL HIGH (ref 11.5–14.5)
WBC: 2.5 10*3/uL — AB (ref 3.6–11.0)

## 2017-09-04 LAB — PROTIME-INR
INR: 0.87
PROTHROMBIN TIME: 11.7 s (ref 11.4–15.2)

## 2017-09-04 MED ORDER — SODIUM CHLORIDE 0.9 % IV SOLN
INTRAVENOUS | Status: DC
  Administered 2017-09-04: 09:00:00 via INTRAVENOUS

## 2017-09-04 MED ORDER — MIDAZOLAM HCL 5 MG/5ML IJ SOLN
INTRAMUSCULAR | Status: AC | PRN
  Administered 2017-09-04: 1 mg via INTRAVENOUS

## 2017-09-04 MED ORDER — HEPARIN SOD (PORK) LOCK FLUSH 100 UNIT/ML IV SOLN
INTRAVENOUS | Status: AC
  Filled 2017-09-04: qty 5

## 2017-09-04 MED ORDER — FENTANYL CITRATE (PF) 100 MCG/2ML IJ SOLN
INTRAMUSCULAR | Status: AC
  Filled 2017-09-04: qty 2

## 2017-09-04 MED ORDER — FENTANYL CITRATE (PF) 100 MCG/2ML IJ SOLN
INTRAMUSCULAR | Status: AC | PRN
  Administered 2017-09-04: 50 ug via INTRAVENOUS

## 2017-09-04 MED ORDER — MIDAZOLAM HCL 5 MG/5ML IJ SOLN
INTRAMUSCULAR | Status: AC
  Filled 2017-09-04: qty 5

## 2017-09-04 NOTE — Procedures (Signed)
Pre-procedure Diagnosis: Leukopenia Post-procedure Diagnosis: Same  Technically successful CT guided bone marrow aspiration and biopsy of left iliac crest.   Complications: None Immediate  EBL: None  SignedSandi Mariscal Pager: [phone] 09/04/2017, 10:05 AM

## 2017-09-04 NOTE — Sedation Documentation (Signed)
Total sedation: Versed 1 mg IV, Fentanyl 50 mcg IV. Pt. Tolerated procedure well 

## 2017-09-04 NOTE — H&P (Signed)
Chief Complaint: Leukopenia  Referring Physician(s): Ellen Henri, Melissa  Supervising Physician: Sandi Mariscal  Patient Status: [facility] - Out-pt  History of Present Illness: [name] is a 83 y.o. female with progressive leukopenia since 03/2017.    Most recent WBC was 1.8 on 08/20/2017.  She has also had ongoing issues with diverticulitis and has been on multiple rounds of antibiotics.   She is being considered for a bowel resection.    She was diagnosed with temporal arteritis 17 years ago and has been on chronic prednisone.    She reports fatigue and weight loss of 20 pounds since 03/2017.  We are asked to perform a bone marrow biopsy.   Past Medical History:  Diagnosis Date  . Breast cancer ([place])    1985  (bilateral mastectomy )  . Depression   . Diverticulosis   . GERD (gastroesophageal reflux disease)   . Hiatal hernia   . History of Bell's palsy   . History of colon polyps   . History of nephrolithiasis   . History of pancreatitis   . Hyperlipidemia   . IBS (irritable bowel syndrome)   . Osteoporosis   . Skin cancer   . Temporal arteritis ([place])   . Tubular adenoma of colon     Past Surgical History:  Procedure Laterality Date  . LAPAROSCOPIC CHOLECYSTECTOMY  2009  . MASTECTOMY  1983   bilateral    Allergies: Benzalkonium chloride; Escitalopram; and Neomycin-bacitracin zn-polymyx  Medications: Prior to Admission medications   Medication Sig Start Date End Date Taking? Authorizing Provider  ALPRAZolam (XANAX) 0.25 MG tablet Take 0.25 mg by mouth 2 (two) times daily as needed.  11/03/10   [provider]  aspirin EC 81 MG tablet Take 81 mg daily by mouth.    [provider]  Biotin 1000 MCG tablet Take by mouth.    [provider]  Cholecalciferol (VITAMIN D3) 2000 units capsule Take 2,000 Units daily by mouth.     [provider]  Cyanocobalamin (B-12 PO) Take 2,500 mcg daily by mouth.      [provider]  dicyclomine (BENTYL) 20 MG tablet Take 1 tablet (20 mg total) by mouth 3 (three) times daily before meals. 10/20/14   Lafayette Dragon, MD  estradiol (ESTRACE) 1 MG tablet Take 1 mg daily by mouth.  10/11/16   [provider]  feeding supplement, ENSURE ENLIVE, (ENSURE ENLIVE) LIQD Take 237 mLs by mouth 2 (two) times daily between meals. 06/17/17   Fritzi Mandes, MD  fluticasone (FLONASE) 50 MCG/ACT nasal spray Place into the nose. 12/24/16   [provider]  medroxyPROGESTERone (PROVERA) 2.5 MG tablet Take 2.5 mg by mouth daily.  12/11/16   [provider]  ondansetron (ZOFRAN ODT) 4 MG disintegrating tablet Take 1 tablet (4 mg total) every 8 (eight) hours as needed by mouth for nausea or vomiting. Patient not taking: Reported on 06/14/2017 04/16/17   Earleen Newport, MD  predniSONE (DELTASONE) 5 MG tablet Take 5 mg by mouth daily.     [provider]  traMADol (ULTRAM) 50 MG tablet Take 50 mg by mouth daily as needed. One tablet by mouth twice daily  11/21/10   [provider]     Family History  Problem Relation Age of Onset  . Colon cancer Mother 66  . Scleroderma Father 73  . Alcoholism Brother   . Stomach cancer Neg Hx     Social History   Socioeconomic History  .  Marital status: Widowed    Spouse name: Not on file  . Number of children: 2  . Years of education: college  . Highest education level: Not on file  Occupational History  . 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 Financial resource strain: Not on file  . Food insecurity:    Worry: Not on file    Inability: Not on file  . Transportation needs:    Medical: Not on file    Non-medical: Not on file  Tobacco Use  . Smoking status: Former Smoker    Packs/day: 0.25    Years: 30.00    Pack years: 7.50    Types: Cigarettes    Last attempt to quit: 05/17/2005    Years since quitting: 12.3  . Smokeless tobacco: Never Used  .  Tobacco comment: quit 11 years ago  Substance and Sexual Activity  . Alcohol use: No    Alcohol/week: 0.0 oz  . Drug use: No  . Sexual activity: Not Currently  Lifestyle  . Physical activity:    Days per week: Not on file    Minutes per session: Not on file  . Stress: Not on file  Relationships  . Social connections:    Talks on phone: Not on file    Gets together: Not on file    Attends religious service: Not on file    Active member of club or organization: Not on file    Attends meetings of clubs or organizations: Not on file    Relationship status: Not on file  Other Topics Concern  . Not on file  Social History Narrative   Patient lives at home alone.    Patient is retired.    Patient has some college.    Patient has 2 children.    Review of Systems: A 12 point ROS discussed and pertinent positives are indicated in the HPI above.  All other systems are negative. Review of Systems  Vital Signs: BP (!) 145/65   Temp 97.8 F (36.6 C) (Oral)   Resp 18   Ht '5\' 5"'  (1.651 m)   SpO2 100%   BMI 25.72 kg/m   Physical Exam  Constitutional: She is oriented to person, place, and time.  Appears younger than chronological age, NAD  HENT:  Head: Normocephalic and atraumatic.  Eyes: EOM are normal.  Neck: Normal range of motion.  Cardiovascular: Normal rate and regular rhythm.  Pulmonary/Chest: Effort normal and breath sounds normal.  Abdominal: She exhibits no distension.  Musculoskeletal: Normal range of motion.  Neurological: She is alert and oriented to person, place, and time.  Skin: Skin is warm and dry.  Psychiatric: She has a normal mood and affect. Her behavior is normal. Judgment and thought content normal.  Vitals reviewed.   Imaging: No results found.  Labs:  CBC: Recent Labs    06/16/17 0534 08/02/17 1758 08/03/17 0404 08/20/17 1650  WBC 2.3* 1.8* 2.0* 1.8*  HGB 12.1 12.8 12.5 13.3  HCT 37.4 39.6 38.0 40.8  PLT 196 265 208 221    COAGS: No  results for input(s): INR, APTT in the last 8760 hours.  BMP: Recent Labs    06/16/17 0534 07/30/17 1043 08/02/17 1758 08/03/17 0404 08/20/17 1650  NA 141  --  139 143 136  K 3.3*  --  4.0 3.6 3.8  CL 112*  --  104 108 100*  CO2 22  --  '24 24 25  ' GLUCOSE 81  --  100* 79 103*  BUN 5*  --  '9 10 10  ' CALCIUM 7.7*  --  8.7* 8.2* 8.8*  CREATININE 0.91 0.90 0.96 0.90 0.87  GFRNONAA 58*  --  54* 59* >60  GFRAA >60  --  >60 >60 >60    LIVER FUNCTION TESTS: Recent Labs    04/16/17 0916 06/14/17 1702 08/02/17 1758 08/20/17 1650  BILITOT 1.5* 0.8 1.0 0.8  AST '25 23 23 26  ' ALT 12* 9* 9* 12*  ALKPHOS 94 59 62 68  PROT 7.0 6.4* 7.0 7.7  ALBUMIN 3.7 3.5 3.5 4.0    TUMOR MARKERS: No results for input(s): AFPTM, CEA, CA199, CHROMGRNA in the last 8760 hours.  Assessment and Plan:  Progressive leukopenia since 03/2017.    Will proceed with bone marrow biopsy today by Dr. Pascal Lux.  Risks and benefits discussed with the patient including, but not limited to bleeding, infection, damage to adjacent structures or low yield requiring additional tests.  All of the patient's questions were answered, patient is agreeable to proceed. Consent signed and in chart.  Thank you for this interesting consult.  I greatly enjoyed meeting [name] and look forward to participating in their care.  A copy of this report was sent to the requesting provider on this date.  Electronically Signed: Murrell Redden, PA-C 09/04/2017, 8:37 AM   I spent a total of  30 Minutes in face to face in clinical consultation, greater than 50% of which was counseling/coordinating care for bone marrow biopsy.

## 2017-09-04 NOTE — Discharge Instructions (Signed)
Needle Biopsy of the Bone  A bone biopsy is a procedure in which a small sample of bone is removed. The sample is taken with a needle. Then, the bone sample is looked at under a microscope to check for abnormalities. The sample is usually taken from a bone that is close to the skin. This procedure may be done to check for various problems with the bone. You may need this procedure if imaging tests or blood tests have indicated a possible problem. This procedure may be done to help determine if a bone tumor is cancerous (malignant). A bone biopsy can help to diagnose problems such as:  · Tumors of the bone (sarcomas) and bone marrow (multiple myeloma).  · Bone that forms abnormally (Paget disease).  · Noncancerous (benign) bone cysts.  · Bony growths.  · Infections in the bone.    Tell a health care provider about:  · Any allergies you have.  · All medicines you are taking, including vitamins, herbs, eye drops, creams, and over-the-counter medicines.  · Any problems you or family members have had with anesthetic medicines.  · Any blood disorders you have.  · Any surgeries you have had.  · Any medical conditions you have.  What are the risks?  Generally, this is a safe procedure. However, problems may occur, including:  · Excessive bleeding.  · Infection.  · Injury to surrounding tissue.    What happens before the procedure?  · Ask your health care provider about:  ? Changing or stopping your regular medicines. This is especially important if you are taking diabetes medicines or blood thinners.  ? Taking medicines such as aspirin and ibuprofen. These medicines can thin your blood. Do not take these medicines before your procedure if your health care provider instructs you not to.  · Follow instructions from your health care provider about eating or drinking restrictions.  · Plan to have someone take you home after the procedure.  · If you go home right after the procedure, plan to have someone with you for 24  hours.  What happens during the procedure?  · An IV tube may be inserted into one of your veins.  · The injection site will be cleaned with a germ-killing solution (antiseptic).  · You will be given one or more of the following:  ? A medicine to help you relax (sedative).  ? A medicine to numb the area (local anesthetic).  · The sample of bone will be removed by putting a large needle through the skin and into the bone.  · The needle will be removed.  · A bandage (dressing) will be placed over the insertion site and taped in place.  The procedure may vary among health care providers and hospitals.  What happens after the procedure?  · Your blood pressure, heart rate, breathing rate, and blood oxygen level will be monitored often until the medicines you were given have worn off.  · Return to your normal activities as told by your health care provider.  This information is not intended to replace advice given to you by your health care provider. Make sure you discuss any questions you have with your health care provider.  Document Released: 03/23/2004 Document Revised: 10/21/2015 Document Reviewed: 06/22/2014  Elsevier Interactive Patient Education © 2018 [employer]vier Inc[employer]

## 2017-09-10 ENCOUNTER — Inpatient Hospital Stay: Payer: Medicare Other | Admitting: Hematology and Oncology

## 2017-09-11 ENCOUNTER — Other Ambulatory Visit: Payer: Self-pay

## 2017-09-11 ENCOUNTER — Inpatient Hospital Stay: Payer: Medicare Other

## 2017-09-11 ENCOUNTER — Encounter: Payer: Self-pay | Admitting: Hematology and Oncology

## 2017-09-11 ENCOUNTER — Inpatient Hospital Stay: Payer: Medicare Other | Admitting: Hematology and Oncology

## 2017-09-11 VITALS — BP 178/72 | HR 103 | Temp 97.2°F | Wt 153.8 lb

## 2017-09-11 DIAGNOSIS — R61 Generalized hyperhidrosis: Secondary | ICD-10-CM

## 2017-09-11 DIAGNOSIS — R21 Rash and other nonspecific skin eruption: Secondary | ICD-10-CM

## 2017-09-11 DIAGNOSIS — Z85828 Personal history of other malignant neoplasm of skin: Secondary | ICD-10-CM | POA: Diagnosis not present

## 2017-09-11 DIAGNOSIS — Z800 Family history of malignant neoplasm of digestive organs: Secondary | ICD-10-CM

## 2017-09-11 DIAGNOSIS — D72819 Decreased white blood cell count, unspecified: Secondary | ICD-10-CM | POA: Diagnosis not present

## 2017-09-11 DIAGNOSIS — Z7982 Long term (current) use of aspirin: Secondary | ICD-10-CM

## 2017-09-11 DIAGNOSIS — Z79899 Other long term (current) drug therapy: Secondary | ICD-10-CM

## 2017-09-11 DIAGNOSIS — D709 Neutropenia, unspecified: Secondary | ICD-10-CM

## 2017-09-11 DIAGNOSIS — R5383 Other fatigue: Secondary | ICD-10-CM | POA: Diagnosis not present

## 2017-09-11 DIAGNOSIS — Z87891 Personal history of nicotine dependence: Secondary | ICD-10-CM | POA: Diagnosis not present

## 2017-09-11 DIAGNOSIS — L259 Unspecified contact dermatitis, unspecified cause: Secondary | ICD-10-CM

## 2017-09-11 DIAGNOSIS — L308 Other specified dermatitis: Secondary | ICD-10-CM

## 2017-09-11 DIAGNOSIS — Z853 Personal history of malignant neoplasm of breast: Secondary | ICD-10-CM

## 2017-09-11 DIAGNOSIS — Z7952 Long term (current) use of systemic steroids: Secondary | ICD-10-CM | POA: Diagnosis not present

## 2017-09-11 DIAGNOSIS — R634 Abnormal weight loss: Secondary | ICD-10-CM | POA: Diagnosis not present

## 2017-09-11 LAB — CBC WITH DIFFERENTIAL/PLATELET
Basophils Absolute: 0 10*3/uL (ref 0–0.1)
Basophils Relative: 1 %
Eosinophils Absolute: 0 10*3/uL (ref 0–0.7)
Eosinophils Relative: 0 %
HCT: 40.2 % (ref 35.0–47.0)
Hemoglobin: 13.6 g/dL (ref 12.0–16.0)
Lymphocytes Relative: 19 %
Lymphs Abs: 0.6 10*3/uL — ABNORMAL LOW (ref 1.0–3.6)
MCH: 31 pg (ref 26.0–34.0)
MCHC: 33.9 g/dL (ref 32.0–36.0)
MCV: 91.4 fL (ref 80.0–100.0)
Monocytes Absolute: 0.2 10*3/uL (ref 0.2–0.9)
Monocytes Relative: 5 %
Neutro Abs: 2.3 10*3/uL (ref 1.4–6.5)
Neutrophils Relative %: 75 %
Platelets: 267 10*3/uL (ref 150–440)
RBC: 4.4 MIL/uL (ref 3.80–5.20)
RDW: 15.3 % — ABNORMAL HIGH (ref 11.5–14.5)
WBC: 3.1 10*3/uL — ABNORMAL LOW (ref 3.6–11.0)

## 2017-09-11 LAB — CK: CK TOTAL: 36 U/L — AB (ref 38–234)

## 2017-09-11 NOTE — Progress Notes (Signed)
[facility] day:  09/11/2017  Chief Complaint: [name] is a 83 y.o. female with neutropenia who is seen for review of interval bone marrow and discussion regarding direction of therapy.  HPI:   The patient was last seen in the hematology clinic on 08/27/2017.  She was fatigued. She had a diffuse rash to her torso. She had lost 22 pounds since 03/2017 and 2 pounds since her last visit.  Exam revealed a diffuse maculopapular rash.   Bone marrow aspirate and biopsy on 09/04/2017 revealed a hypercellular marrow for age with trilineage hematopoiesis.  There was abundant mature neutrophils.  Significant dyspoiesis or increased blasts was not identified.  Flow cytometry revealed a predominance of T lymphocytes (16% of all cells) with no abnormal phenotype.  There was a minor B-cell population (13% of lymphocytes) with slight kappa light chain excess.  Cytogenetics are pending.  She underwent skin biopsies by Dr. Evorn Gong on 08/29/2017.  Rgiht upper lip cutaneous shave biopsy revealed squamous cell carcinoma in situ extending to edge of biopsy.  Repeat excision needed.  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  This pattern could be c/w lichen planus, lichenoid drug eruption, lichenoid variant of connective tissue disease, and pityriasis lichenoides.  During the interim, she has felt good.  She states that her night sweats stopped for awhile.  Sweats mostly involved her head.  She denies any adenopathy, bruising or bleeding.  She has had no infections.   Past Medical History:  Diagnosis Date  . Breast cancer ([place])    1985  (bilateral mastectomy )  . Depression   . Diverticulosis   . GERD (gastroesophageal reflux disease)   . Hiatal hernia   . History of Bell's palsy    . History of colon polyps   . History of nephrolithiasis   . History of pancreatitis   . Hyperlipidemia   . IBS (irritable bowel syndrome)   . Osteoporosis   . Skin cancer   . Temporal arteritis ([place])   . Tubular adenoma of colon     Past Surgical History:  Procedure Laterality Date  . LAPAROSCOPIC CHOLECYSTECTOMY  2009  . MASTECTOMY  1983   bilateral    Family History  Problem Relation Age of Onset  . Colon cancer Mother 6  . Scleroderma Father 58  . Alcoholism Brother   . Stomach cancer Neg Hx     Social History:  reports that she quit smoking about 12 years ago. Her smoking use included cigarettes. She has a 7.50 pack-year smoking history. She has never used smokeless tobacco. She reports that she does not drink alcohol or use drugs. Patient smoked about 5 cigarettes a day for about 30 years; stopped in 2006. 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 Patient denies known exposures to radiation on toxins. Patient has 2 adult children. The patient is accompanied by her son and his wife today.  Allergies:  Allergies  Allergen Reactions  . Benzalkonium Chloride Dermatitis    Other reaction(s): Unknown  . Escitalopram     Deveioped a twitch  . Neomycin-Bacitracin Zn-Polymyx Dermatitis    Other reaction(s): Other (See Comments) REACTION: blisters skin REACTION: blisters skin REACTION: blisters skin    Current Medications: Current Outpatient Medications  Medication Sig Dispense Refill  . aspirin EC 81 MG tablet Take 81 mg daily by  mouth.    . Biotin 1000 MCG tablet Take by mouth.    . Cholecalciferol (VITAMIN D3) 2000 units capsule Take 2,000 Units daily by mouth.     . estradiol (ESTRACE) 1 MG tablet Take 1 mg daily by mouth.   0  . feeding supplement, ENSURE ENLIVE, (ENSURE ENLIVE) LIQD Take 237 mLs by mouth 2 (two) times daily between meals. 237 mL 12  . medroxyPROGESTERone (PROVERA) 2.5 MG tablet Take 2.5 mg by mouth daily.     . predniSONE  (DELTASONE) 5 MG tablet Take 5 mg by mouth daily.     . traMADol (ULTRAM) 50 MG tablet Take 50 mg by mouth daily as needed. One tablet by mouth twice daily     . ALPRAZolam (XANAX) 0.25 MG tablet Take 0.25 mg by mouth 2 (two) times daily as needed.     . Cyanocobalamin (B-12 PO) Take 2,500 mcg daily by mouth.     . dicyclomine (BENTYL) 20 MG tablet Take 1 tablet (20 mg total) by mouth 3 (three) times daily before meals. (Patient not taking: Reported on 09/11/2017) 90 tablet 3  . fluticasone (FLONASE) 50 MCG/ACT nasal spray Place into the nose.    . ondansetron (ZOFRAN ODT) 4 MG disintegrating tablet Take 1 tablet (4 mg total) every 8 (eight) hours as needed by mouth for nausea or vomiting. (Patient not taking: Reported on 06/14/2017) 20 tablet 0   No current facility-administered medications for this visit.     Review of Systems:  GENERAL:  Feels good.  No fevers.  Night sweats stopped.  Head sweats.  Weight loss of 22 pounds since 03/2017. PERFORMANCE STATUS (ECOG):  1 HEENT:  Chronic runny nose.  No visual changes, sore throat, mouth sores or tenderness. Lungs: No shortness of breath or cough.  No hemoptysis. Cardiac:  No chest pain, palpitations, orthopnea, or PND. GI:  Bowel movements 6/week.  No nausea, vomiting, diarrhea, constipation, melena or hematochezia. GU:  No urgency, frequency, dysuria, or hematuria. Musculoskeletal:  No back pain.  Arthritis pain.  No muscle tenderness. Extremities:  No pain or swelling. Skin:  Diffuse maculopapular rash.  Skin biopsies (see HPI). Neuro:  No headache, numbness or weakness, balance or coordination issues. Endocrine:  No diabetes, thyroid issues, hot flashes or night sweats. Psych:  No mood changes, depression or anxiety. Pain:  No focal pain. Review of systems:  All other systems reviewed and found to be negative.   Physical Exam: Blood pressure (!) 178/72, pulse (!) 103, temperature (!) 97.2 F (36.2 C), temperature source Tympanic, weight  153 lb 12.8 oz (69.8 kg). GENERAL:  Well developed, well nourished, elderly woman sitting comfortably in the exam room in no acute distress. MENTAL STATUS:  Alert and oriented to person, place and time. HEAD: Short blonde hair.  Normocephalic, atraumatic, face symmetric, no Cushingoid features. EYES:  Blue eyes.  No conjunctivitis or scleral icterus. SKIN: Diffuse maculopapular rash.   NEUROLOGICAL: Unremarkable. PSYCH:  Appropriate.    Appointment on 09/11/2017  Component Date Value Ref Range Status  . Complement C4, Body Fluid 09/11/2017 33  14 - 44 mg/dL Final   Comment: (NOTE) Performed At: [facility] [place], [place] [id number] Rush Farmer MD [id number] Performed at [facility], [address] [place], [place] [address]   . ds DNA Ab 09/11/2017 2  0 - 9 IU/mL Final   Comment: (NOTE)  Negative      <5                                   Equivocal  5 - 9                                   Positive      >9   . Ribonucleic Protein 09/11/2017 <0.2  0.0 - 0.9 AI Final  . ENA SM Ab Ser-aCnc 09/11/2017 <0.2  0.0 - 0.9 AI Final  . Scleroderma (Scl-70) (ENA) Antibod* 09/11/2017 <0.2  0.0 - 0.9 AI Final  . SSA (Ro) (ENA) Antibody, IgG 09/11/2017 <0.2  0.0 - 0.9 AI Final  . SSB (La) (ENA) Antibody, IgG 09/11/2017 <0.2  0.0 - 0.9 AI Final  . Chromatin Ab SerPl-aCnc 09/11/2017 0.3  0.0 - 0.9 AI Final  . Anti JO-1 09/11/2017 <0.2  0.0 - 0.9 AI Final  . Centromere Ab Screen 09/11/2017 <0.2  0.0 - 0.9 AI Final  . See below: 09/11/2017 Comment   Final   Comment: (NOTE) Autoantibody                       Disease Association ------------------------------------------------------------                        Condition                  Frequency ---------------------   ------------------------   --------- Antinuclear Antibody,    SLE, mixed connective Direct (ANA-D)           tissue diseases ---------------------    ------------------------   --------- dsDNA                    SLE                        40 - 60% ---------------------   ------------------------   --------- Chromatin                Drug induced SLE                90%                         SLE                        48 - 97% ---------------------   ------------------------   --------- SSA (Ro)                 SLE                        25 - 35%                         Sjogren's Syndrome         40 - 70%                         Neonatal Lupus                 100% ---------------------   ------------------------   --------- SSB (La)  SLE                                                       10%                         Sjogren's Syndrome              30% ---------------------   -----------------------    --------- Sm (anti-Smith)          SLE                        15 - 30% ---------------------   -----------------------    --------- RNP                      Mixed Connective Tissue                         Disease                         95% (U1 nRNP,                SLE                        30 - 50% anti-ribonucleoprotein)  Polymyositis and/or                         Dermatomyositis                 20% ---------------------   ------------------------   --------- Scl-70 (antiDNA          Scleroderma (diffuse)      20 - 35% topoisomerase)           Crest                           13% ---------------------   ------------------------   --------- Jo-1                     Polymyositis and/or                         Dermatomyositis            20 - 40% ---------------------   ------------------------   --------- Centromere B             Scleroderma -                           Crest                         variant                         80% Performed At: [facility] [place], [place] [id number] Rush Farmer MD [phone] Performed at [facility], [address], [place],  [place] [address]   . C3 Complement 09/11/2017 167  82 - 167 mg/dL Final   Comment: (NOTE) Performed At: [facility]  [place], [place] [id number] Rush Farmer MD [id number] Performed at [facility], [address], [place], [place] [address]   . Aldolase 09/11/2017 4.1  3.3 - 10.3 U/L Final   Comment: (NOTE) Performed At: [facility] [place], [place] [id number] Rush Farmer MD [id number] Performed at [facility], [address], [place], [place] [address]   . Total CK 09/11/2017 36* 38 - 234 U/L Final   Performed at [facility], [address] [place], [place] [address]  . Anit Nuclear Antibody(ANA) 09/11/2017 Negative  Negative Final   Comment: (NOTE) Performed At: [facility] [place], [place] [id number] Rush Farmer MD [id number] Performed at [facility], [address], [place], [place] [address]   . WBC 09/11/2017 3.1* 3.6 - 11.0 K/uL Final  . RBC 09/11/2017 4.40  3.80 - 5.20 MIL/uL Final  . Hemoglobin 09/11/2017 13.6  12.0 - 16.0 g/dL Final  . HCT 09/11/2017 40.2  35.0 - 47.0 % Final  . MCV 09/11/2017 91.4  80.0 - 100.0 fL Final  . MCH 09/11/2017 31.0  26.0 - 34.0 pg Final  . MCHC 09/11/2017 33.9  32.0 - 36.0 g/dL Final  . RDW 09/11/2017 15.3* 11.5 - 14.5 % Final  . Platelets 09/11/2017 267  150 - 440 K/uL Final  . Neutrophils Relative % 09/11/2017 75  % Final  . Neutro Abs 09/11/2017 2.3  1.4 - 6.5 K/uL Final  . Lymphocytes Relative 09/11/2017 19  % Final  . Lymphs Abs 09/11/2017 0.6* 1.0 - 3.6 K/uL Final  . Monocytes Relative 09/11/2017 5  % Final  . Monocytes Absolute 09/11/2017 0.2  0.2 - 0.9 K/uL Final  . Eosinophils Relative 09/11/2017 0  % Final  . Eosinophils Absolute 09/11/2017 0.0  0 - 0.7 K/uL Final  . Basophils Relative 09/11/2017 1  % Final  . Basophils Absolute 09/11/2017 0.0  0 - 0.1 K/uL Final   Performed at [facility], [address], [place], [place] [address]    Assessment:  NOVELLE ADDAIR is a 83 y.o. female with progressive leukopenia since 03/2017.  She has had ongoing issues with diverticulitis and has been on multiple rounds of antibiotics.  She is being considered for a bowel resection.  She denies any new medications or herbal products.  Work-up on 08/20/2017 revealed a hematocrit of 40.8, hemoglobin 13.3, MCV 91.6, platelets 221,000, WBC 1800 with an ANC of 1100.  CMP was unremarkable.  ANA was negative.  HIV antibody, hepatitis C antibody, hepatitis B core antibody total, and hepatitis B surface antigen were normal.  Folate and copper were normal.  B12 was 3389 (high).  SPEP revealed no monoclonal protein. TSH was normal.  Bone marrow aspirate and biopsy on 09/04/2017 revealed a hypercellular marrow for age with trilineage hematopoiesis.  There was abundant mature neutrophils.  Significant dyspoiesis or increased blasts was not identified.  Flow cytometry revealed a predominance of T lymphocytes (16% of all cells) with no abnormal phenotype.  There was a minor B-cell population (13% of lymphocytes) with slight kappa light chain excess.  Cytogenetics is pending.  She was diagnosed with temporal arteritis 17 years ago.  She has been on chronic prednisone.  Her current maintenance dose is 2.5 mg a day.  She has a diffuse maculopapular rash.  Skin biopsy on 79/89/2119 revealed lichenoid interface dermatitis with patchy band-like infiltrate of lymphocytes in papillary dermis beneath an acanthotic epidermis that contained foci of  pankeratosis and hyperkeratosis.  This pattern could be c/w lichen planus, lichenoid drug eruption, lichenoid variant of connective tissue disease, and pityriasis lichenoides.  Symptomatically, she is fatigued.  Patient has a diffuse rash to her torso. She has lost 22 pounds since 03/2017.  She has some sweats.  Exam reveals a diffuse maculopapular rash.   Plan: 1.  Review bone  marrow.  Etiology of leukopenia unclear.  Suspect autoimmune. No maturation arrest.  No leukemia or lymphoma. 2.  Review skin biopsies.  Additional labs requested by Dr. Evorn Gong. 3.  Labs today:  CBC with diff, ANA with reflex, ENA, CK and aldolase. 4.  Schedule chest CT (non-contrast)- r/o adenopathy, thymoma. 5.  Contact Dr. Precious Reel. 6.  RTC in 1 week for MD assessment and to review results. CT needs to be done BEFORE visit.    Lequita Asal, MD  09/11/2017, 4:35 PM

## 2017-09-11 NOTE — Progress Notes (Signed)
Here for follow up. Biopsy of rash to mid abdomen done by Dr. Evorn Gong at [facility] dermatology.

## 2017-09-12 LAB — ANA COMPREHENSIVE PANEL
CHROMATIN AB SERPL-ACNC: 0.3 AI (ref 0.0–0.9)
ENA SM Ab Ser-aCnc: <0.2 AI (ref 0.0–0.9)
Ribonucleic Protein: <0.2 AI (ref 0.0–0.9)
SSA (Ro) (ENA) Antibody, IgG: <0.2 AI (ref 0.0–0.9)
SSB (La) (ENA) Antibody, IgG: <0.2 AI (ref 0.0–0.9)
Scleroderma (Scl-70) (ENA) Antibody, IgG: <0.2 AI (ref 0.0–0.9)
ds DNA Ab: 2 IU/mL (ref 0–9)

## 2017-09-12 LAB — ANA W/REFLEX: ANA: NEGATIVE

## 2017-09-12 LAB — ALDOLASE: ALDOLASE: 4.1 U/L (ref 3.3–10.3)

## 2017-09-12 LAB — C3 COMPLEMENT: C3 COMPLEMENT: 167 mg/dL (ref 82–167)

## 2017-09-12 LAB — C4 COMPLEMENT: COMPLEMENT C4, BODY FLUID: 33 mg/dL (ref 14–44)

## 2017-09-17 ENCOUNTER — Encounter (HOSPITAL_COMMUNITY): Payer: Self-pay | Admitting: Hematology and Oncology

## 2017-09-20 LAB — CHROMOSOME ANALYSIS, BONE MARROW

## 2017-09-21 ENCOUNTER — Other Ambulatory Visit: Payer: Self-pay

## 2017-09-21 ENCOUNTER — Emergency Department
Admission: EM | Admit: 2017-09-21 | Discharge: 2017-09-21 | Disposition: A | Discharge status: Home / Self Care | Payer: Medicare Other | Attending: Emergency Medicine | Admitting: Emergency Medicine

## 2017-09-21 ENCOUNTER — Emergency Department: Payer: Medicare Other

## 2017-09-21 ENCOUNTER — Ambulatory Visit
Admission: RE | Admit: 2017-09-21 | Discharge: 2017-09-21 | Disposition: A | Discharge status: Home / Self Care | Payer: Medicare Other | Source: Ambulatory Visit | Attending: Urgent Care | Admitting: Urgent Care

## 2017-09-21 DIAGNOSIS — I700 Atherosclerosis of aorta: Secondary | ICD-10-CM | POA: Insufficient documentation

## 2017-09-21 DIAGNOSIS — N200 Calculus of kidney: Secondary | ICD-10-CM | POA: Insufficient documentation

## 2017-09-21 DIAGNOSIS — D72819 Decreased white blood cell count, unspecified: Secondary | ICD-10-CM | POA: Insufficient documentation

## 2017-09-21 DIAGNOSIS — Z79899 Other long term (current) drug therapy: Secondary | ICD-10-CM | POA: Insufficient documentation

## 2017-09-21 DIAGNOSIS — R200 Anesthesia of skin: Secondary | ICD-10-CM | POA: Diagnosis not present

## 2017-09-21 DIAGNOSIS — Z87891 Personal history of nicotine dependence: Secondary | ICD-10-CM | POA: Insufficient documentation

## 2017-09-21 DIAGNOSIS — R410 Disorientation, unspecified: Secondary | ICD-10-CM | POA: Diagnosis not present

## 2017-09-21 DIAGNOSIS — Z9013 Acquired absence of bilateral breasts and nipples: Secondary | ICD-10-CM | POA: Diagnosis not present

## 2017-09-21 DIAGNOSIS — Z7982 Long term (current) use of aspirin: Secondary | ICD-10-CM | POA: Diagnosis not present

## 2017-09-21 DIAGNOSIS — Z853 Personal history of malignant neoplasm of breast: Secondary | ICD-10-CM | POA: Insufficient documentation

## 2017-09-21 DIAGNOSIS — Z85828 Personal history of other malignant neoplasm of skin: Secondary | ICD-10-CM | POA: Diagnosis not present

## 2017-09-21 DIAGNOSIS — R531 Weakness: Secondary | ICD-10-CM | POA: Diagnosis not present

## 2017-09-21 LAB — CBC WITH DIFFERENTIAL/PLATELET
Basophils Absolute: 0 10*3/uL (ref 0–0.1)
Basophils Relative: 1 %
EOS ABS: 0 10*3/uL (ref 0–0.7)
EOS PCT: 1 %
HCT: 37.4 % (ref 35.0–47.0)
Hemoglobin: 12.6 g/dL (ref 12.0–16.0)
LYMPHS ABS: 1 10*3/uL (ref 1.0–3.6)
Lymphocytes Relative: 49 %
MCH: 30.8 pg (ref 26.0–34.0)
MCHC: 33.7 g/dL (ref 32.0–36.0)
MCV: 91.5 fL (ref 80.0–100.0)
MONOS PCT: 17 %
Monocytes Absolute: 0.3 10*3/uL (ref 0.2–0.9)
Neutro Abs: 0.7 10*3/uL — ABNORMAL LOW (ref 1.4–6.5)
Neutrophils Relative %: 32 %
PLATELETS: 226 10*3/uL (ref 150–440)
RBC: 4.09 MIL/uL (ref 3.80–5.20)
RDW: 15.3 % — ABNORMAL HIGH (ref 11.5–14.5)
WBC: 2.1 10*3/uL — AB (ref 3.6–11.0)

## 2017-09-21 LAB — COMPREHENSIVE METABOLIC PANEL
ALK PHOS: 111 U/L (ref 38–126)
ALT: 12 U/L — AB (ref 14–54)
AST: 22 U/L (ref 15–41)
Albumin: 3.4 g/dL — ABNORMAL LOW (ref 3.5–5.0)
Anion gap: 5 (ref 5–15)
BUN: 8 mg/dL (ref 6–20)
CHLORIDE: 106 mmol/L (ref 101–111)
CO2: 28 mmol/L (ref 22–32)
CREATININE: 0.95 mg/dL (ref 0.44–1.00)
Calcium: 8.5 mg/dL — ABNORMAL LOW (ref 8.9–10.3)
GFR calc Af Amer: >60 mL/min (ref >60)
GFR, EST NON AFRICAN AMERICAN: 55 mL/min — AB (ref >60)
Glucose, Bld: 92 mg/dL (ref 65–99)
Potassium: 3.7 mmol/L (ref 3.5–5.1)
Sodium: 139 mmol/L (ref 135–145)
Total Bilirubin: 0.9 mg/dL (ref 0.3–1.2)
Total Protein: 6.6 g/dL (ref 6.5–8.1)

## 2017-09-21 LAB — PROTIME-INR
INR: 0.92
PROTHROMBIN TIME: 12.3 s (ref 11.4–15.2)

## 2017-09-21 LAB — TROPONIN I: Troponin I: <0.03 ng/mL (ref <0.03)

## 2017-09-21 LAB — AMMONIA: Ammonia: 9 umol/L (ref 9–35)

## 2017-09-21 NOTE — ED Triage Notes (Signed)
Pt arrives to ED via POV from home with c/o weakness, "fullness/pressure" on right side of face/head and excessive thirst. Daughter also reports some AMS.

## 2017-09-21 NOTE — ED Provider Notes (Signed)
[facility] Emergency Department Provider Note  ____________________________________________   First MD Initiated Contact with Patient 09/21/17 0601     (approximate)  I have reviewed the triage vital signs and the nursing notes.   HISTORY  Chief Complaint Weakness   HPI [name] is a 83 y.o. female is brought to the emergency department by her children with an episode of confusion and weakness this morning.  The patient is currently being worked up for leukopenia of unclear etiology.  According to the patient's daughter she has intermittent episodes of confusion at night and the daughter is concerned that her white blood cell count might be lower than devious.  The patient also reports a brief episode of numbness to the right side of her face.  No headache.  No double vision or blurred vision.  No ear pain.  No chest pain shortness of breath abdominal pain nausea or vomiting.  Her symptoms came on suddenly and have now resolved.  Nothing particular seemed to make them better.  They are clearly worse at night.  Past Medical History:  Diagnosis Date  . Breast cancer ([place])    1985  (bilateral mastectomy )  . Depression   . Diverticulosis   . GERD (gastroesophageal reflux disease)   . Hiatal hernia   . History of Bell's palsy   . History of colon polyps   . History of nephrolithiasis   . History of pancreatitis   . Hyperlipidemia   . IBS (irritable bowel syndrome)   . Osteoporosis   . Skin cancer   . Temporal arteritis ([place])   . Tubular adenoma of colon     Patient Active Problem List   Diagnosis Date Noted  . Leukopenia 09/02/2017  . Maculopapular rash 09/02/2017  . Diverticulitis 08/02/2017  . Diverticulitis of large intestine with complication   . Diverticulitis of large intestine with perforation and abscess without bleeding   . Pyelonephritis   . Dehydration   . Abscess of sigmoid colon   . Acute diverticulitis 06/14/2017  .  Atherosclerosis of native arteries of extremity with intermittent claudication ([place]) 08/01/2016  . PAD (peripheral artery disease) ([place]) 05/10/2016  . Pain in limb 05/02/2016  . Atrophic vaginitis 11/04/2014  . Cystocele, grade 2 11/04/2014  . Nephrolithiasis 11/04/2014  . H/O neoplasm 07/27/2014  . History of nonmelanoma skin cancer 07/27/2014  . Hyperlipidemia, unspecified 04/14/2014  . Osteoporosis 01/07/2014  . Osteoarthritis 01/07/2014  . Biliary tract disorder 01/07/2014  . Anosmia 03/19/2013  . PANCREATITIS 05/27/2008  . NAUSEA 05/27/2008  . DIARRHEA 05/27/2008  . ABDOMINAL PAIN-EPIGASTRIC 05/27/2008  . Temporal arteritis ([place]) 08/05/2007  . OSTEOPOROSIS 08/05/2007  . IRRITABLE BOWEL SYNDROME, HX OF 08/05/2007  . DIVERTICULOSIS, COLON 03/08/2007  . COLONIC POLYPS, ADENOMATOUS 10/28/1998  . GERD 10/28/1998  . HIATAL HERNIA 10/28/1998    Past Surgical History:  Procedure Laterality Date  . LAPAROSCOPIC CHOLECYSTECTOMY  2009  . MASTECTOMY  1983   bilateral    Prior to Admission medications   Medication Sig Start Date End Date Taking? Authorizing Provider  ALPRAZolam (XANAX) 0.25 MG tablet Take 0.25 mg by mouth 2 (two) times daily as needed.  11/03/10   [provider]  aspirin EC 81 MG tablet Take 81 mg daily by mouth.    [provider]  Biotin 1000 MCG tablet Take by mouth.    [provider]  Cholecalciferol (VITAMIN D3) 2000 units capsule Take 2,000 Units daily by mouth.  [provider]  Cyanocobalamin (B-12 PO) Take 2,500 mcg daily by mouth.     [provider]  dicyclomine (BENTYL) 20 MG tablet Take 1 tablet (20 mg total) by mouth 3 (three) times daily before meals. Patient not taking: Reported on 09/11/2017 10/20/14   Lafayette Dragon, MD  estradiol (ESTRACE) 1 MG tablet Take 1 mg daily by mouth.  10/11/16   [provider]  feeding supplement, ENSURE ENLIVE, (ENSURE ENLIVE) LIQD Take 237 mLs by mouth 2 (two)  times daily between meals. 06/17/17   Fritzi Mandes, MD  fluticasone (FLONASE) 50 MCG/ACT nasal spray Place into the nose. 12/24/16   [provider]  medroxyPROGESTERone (PROVERA) 2.5 MG tablet Take 2.5 mg by mouth daily.  12/11/16   [provider]  ondansetron (ZOFRAN ODT) 4 MG disintegrating tablet Take 1 tablet (4 mg total) every 8 (eight) hours as needed by mouth for nausea or vomiting. Patient not taking: Reported on 06/14/2017 04/16/17   Earleen Newport, MD  predniSONE (DELTASONE) 5 MG tablet Take 5 mg by mouth daily.     [provider]  traMADol (ULTRAM) 50 MG tablet Take 50 mg by mouth daily as needed. One tablet by mouth twice daily  11/21/10   [provider]    Allergies Benzalkonium chloride; Escitalopram; and Neomycin-bacitracin zn-polymyx  Family History  Problem Relation Age of Onset  . Colon cancer Mother 52  . Scleroderma Father 100  . Alcoholism Brother   . Stomach cancer Neg Hx     Social History Social History   Tobacco Use  . Smoking status: Former Smoker    Packs/day: 0.25    Years: 30.00    Pack years: 7.50    Types: Cigarettes    Last attempt to quit: 05/17/2005    Years since quitting: 12.3  . Smokeless tobacco: Never Used  . Tobacco comment: quit 11 years ago  Substance Use Topics  . Alcohol use: No    Alcohol/week: 0.0 oz  . Drug use: No    Review of Systems Constitutional: No fever/chills Eyes: No visual changes. ENT: No sore throat. Cardiovascular: Denies chest pain. Respiratory: Denies shortness of breath. Gastrointestinal: No abdominal pain.  No nausea, no vomiting.  No diarrhea.  No constipation. Genitourinary: Negative for dysuria. Musculoskeletal: Negative for back pain. Skin: Negative for rash. Neurological: Positive for numbness  ____________________________________________   PHYSICAL EXAM:  VITAL SIGNS: ED Triage Vitals  Enc Vitals Group     BP 09/21/17 0556 (!) 146/53     Pulse Rate  09/21/17 0556 80     Resp 09/21/17 0556 18     Temp 09/21/17 0556 98 F (36.7 C)     Temp Source 09/21/17 0556 Oral     SpO2 09/21/17 0556 98 %     Weight 09/21/17 0557 152 lb (68.9 kg)     Height 09/21/17 0557 5\' 5"  (1.651 m)     Head Circumference --      Peak Flow --      Pain Score 09/21/17 0556 5     Pain Loc --      Pain Edu? --      Excl. in [place]? --     Constitutional: Alert and oriented x4 well-appearing nontoxic no diaphoresis speaks full clear sentences Eyes: PERRL EOMI. Head: Atraumatic.  Normal tympanic membranes bilaterally Nose: No congestion/rhinnorhea. Mouth/Throat: No trismus Neck: No stridor.   Cardiovascular: Normal rate, regular rhythm. Grossly normal heart sounds.  Good peripheral circulation.  Respiratory: Normal respiratory effort.  No retractions. Lungs CTAB and moving good air Gastrointestinal: Soft nontender Musculoskeletal: No lower extremity edema   Neurologic:  Normal speech and language. No gross focal neurologic deficits are appreciated. Skin:  Skin is warm, dry and intact. No rash noted. Psychiatric: Mood and affect are normal. Speech and behavior are normal.    ____________________________________________   DIFFERENTIAL includes but not limited to  TIA, stroke, intracerebral hemorrhage, metabolic derangement ____________________________________________   LABS (all labs ordered are listed, but only abnormal results are displayed)  Labs Reviewed  COMPREHENSIVE METABOLIC PANEL - Abnormal; Notable for the following components:      Result Value   Calcium 8.5 (*)    Albumin 3.4 (*)    ALT 12 (*)    GFR calc non Af Amer 55 (*)    All other components within normal limits  CBC WITH DIFFERENTIAL/PLATELET - Abnormal; Notable for the following components:   WBC 2.1 (*)    RDW 15.3 (*)    Neutro Abs 0.7 (*)    All other components within normal limits  PROTIME-INR  TROPONIN I  AMMONIA  URINALYSIS, COMPLETE (UACMP) WITH MICROSCOPIC     Lab work reviewed by me with leukopenia although not neutropenic __________________________________________  EKG  ED ECG REPORT I, Darel Hong, the attending physician, personally viewed and interpreted this ECG.  Date: 09/21/2017 EKG Time:  Rate: 82 Rhythm: normal sinus rhythm QRS Axis: normal Intervals: normal ST/T Wave abnormalities: normal Narrative Interpretation: no evidence of acute ischemia  ____________________________________________  RADIOLOGY  Chest x-ray reviewed by me with no acute disease Head CT reviewed by me with no acute disease ____________________________________________   PROCEDURES  Procedure(s) performed: no  Procedures  Critical Care performed: no  Observation: no ____________________________________________   INITIAL IMPRESSION / ASSESSMENT AND PLAN / ED COURSE  Pertinent labs & imaging results that were available during my care of the patient were reviewed by me and considered in my medical decision making (see chart for details).  By the time I saw the patient family said her symptoms were nearly completely resolved.  Unclear etiology of confusion and generalized right facial numbness so a head CT obtained emergently given concern for stroke versus intracerebral mass versus hemorrhage.  Fortunately the imaging is negative.  She has no metabolic derangement.  She does have leukopenia although not significantly changed from previous.  The patient has a follow-up CT scan scheduled in 1 hour and family and the patient would like to keep her outpatient work-up.  Strict return precautions have been given and the patient verbalizes understanding and agreement with the plan.      ____________________________________________   FINAL CLINICAL IMPRESSION(S) / ED DIAGNOSES  Final diagnoses:  Weakness  Numbness      NEW MEDICATIONS STARTED DURING THIS VISIT:  Discharge Medication List as of 09/21/2017  7:11 AM       Note:  This  document was prepared using Dragon voice recognition software and may include unintentional dictation errors.     Darel Hong, MD 09/21/17 [phone]

## 2017-09-21 NOTE — Discharge Instructions (Signed)
Please keep your appointment for your CT scan today at 8 AM as scheduled and keep your appointment to follow-up with Dr. Mike Gip as scheduled.  Return to the emergency department sooner for any concerns whatsoever.  It was a pleasure to take care of you today, and thank you for coming to our emergency department.  If you have any questions or concerns before leaving please ask the nurse to grab me and I'm more than happy to go through your aftercare instructions again.  If you were prescribed any opioid pain medication today such as Norco, Vicodin, Percocet, morphine, hydrocodone, or oxycodone please make sure you do not drive when you are taking this medication as it can alter your ability to drive safely.  If you have any concerns once you are home that you are not improving or are in fact getting worse before you can make it to your follow-up appointment, please do not hesitate to call 911 and come back for further evaluation.  Darel Hong, MD  Results for orders placed or performed during the hospital encounter of 09/21/17  Comprehensive metabolic panel  Result Value Ref Range   Sodium 139 135 - 145 mmol/L   Potassium 3.7 3.5 - 5.1 mmol/L   Chloride 106 101 - 111 mmol/L   CO2 28 22 - 32 mmol/L   Glucose, Bld 92 65 - 99 mg/dL   BUN 8 6 - 20 mg/dL   Creatinine, Ser 0.95 0.44 - 1.00 mg/dL   Calcium 8.5 (L) 8.9 - 10.3 mg/dL   Total Protein 6.6 6.5 - 8.1 g/dL   Albumin 3.4 (L) 3.5 - 5.0 g/dL   AST 22 15 - 41 U/L   ALT 12 (L) 14 - 54 U/L   Alkaline Phosphatase 111 38 - 126 U/L   Total Bilirubin 0.9 0.3 - 1.2 mg/dL   GFR calc non Af Amer 55 (L) >60 mL/min   GFR calc Af Amer >60 >60 mL/min   Anion gap 5 5 - 15  CBC with Differential  Result Value Ref Range   WBC 2.1 (L) 3.6 - 11.0 K/uL   RBC 4.09 3.80 - 5.20 MIL/uL   Hemoglobin 12.6 12.0 - 16.0 g/dL   HCT 37.4 35.0 - 47.0 %   MCV 91.5 80.0 - 100.0 fL   MCH 30.8 26.0 - 34.0 pg   MCHC 33.7 32.0 - 36.0 g/dL   RDW 15.3 (H) 11.5 -  14.5 %   Platelets 226 150 - 440 K/uL   Neutrophils Relative % 32 %   Neutro Abs 0.7 (L) 1.4 - 6.5 K/uL   Lymphocytes Relative 49 %   Lymphs Abs 1.0 1.0 - 3.6 K/uL   Monocytes Relative 17 %   Monocytes Absolute 0.3 0.2 - 0.9 K/uL   Eosinophils Relative 1 %   Eosinophils Absolute 0.0 0 - 0.7 K/uL   Basophils Relative 1 %   Basophils Absolute 0.0 0 - 0.1 K/uL  Protime-INR  Result Value Ref Range   Prothrombin Time 12.3 11.4 - 15.2 seconds   INR 0.92   Troponin I  Result Value Ref Range   Troponin I <0.03 <0.03 ng/mL  Ammonia  Result Value Ref Range   Ammonia 9 9 - 35 umol/L   Dg Chest 2 View  Result Date: 09/21/2017 CLINICAL DATA:  Weakness.  Altered mental status. EXAM: CHEST - 2 VIEW COMPARISON:  08/11/2012 FINDINGS: Heart size and pulmonary vascularity are normal. Probable emphysematous changes in the lungs. No airspace disease or consolidation.  No blunting of costophrenic angles. No pneumothorax. Soft tissue attenuation over the mid lungs. Degenerative changes in the shoulders. IMPRESSION: Probable emphysematous changes in the lungs. No evidence of active pulmonary disease. Electronically Signed   By: Lucienne Capers M.D.   On: 09/21/2017 06:26   Ct Head Wo Contrast  Result Date: 09/21/2017 CLINICAL DATA:  Weakness, RIGHT facial pressure, altered mental status. History of breast cancer. EXAM: CT HEAD WITHOUT CONTRAST TECHNIQUE: Contiguous axial images were obtained from the base of the skull through the vertex without intravenous contrast. COMPARISON:  CT HEAD April 16, 2017 FINDINGS: BRAIN: No intraparenchymal hemorrhage, mass effect nor midline shift. The ventricles and sulci are normal for age. Patchy supratentorial white matter hypodensities less than expected for patient's age, though non-specific are most compatible with chronic small vessel ischemic disease. No acute large vascular territory infarcts. No abnormal extra-axial fluid collections. Basal cisterns are patent.  VASCULAR: Trace calcific atherosclerosis of the carotid siphons. SKULL: No skull fracture. Moderate temporomandibular osteoarthrosis. No significant scalp soft tissue swelling. SINUSES/ORBITS: The mastoid air-cells and included paranasal sinuses are well-aerated.The included ocular globes and orbital contents are non-suspicious. Status post bilateral ocular lens implants. OTHER: None. IMPRESSION: Normal noncontrast CT HEAD for age. Electronically Signed   By: Elon Alas M.D.   On: 09/21/2017 06:32   Ct Biopsy  Result Date: 09/04/2017 INDICATION: Leukopenia of uncertain etiology. Please perform CT-guided bone marrow biopsy for tissue diagnostic purposes. EXAM: CT-GUIDED BONE MARROW BIOPSY AND ASPIRATION MEDICATIONS: None ANESTHESIA/SEDATION: Fentanyl 50 mcg IV; Versed 1 mg IV Sedation Time: 10 Minutes; The patient was continuously monitored during the procedure by the interventional radiology nurse under my direct supervision. COMPLICATIONS: None immediate. PROCEDURE: Informed consent was obtained from the patient following an explanation of the procedure, risks, benefits and alternatives. The patient understands, agrees and consents for the procedure. All questions were addressed. A time out was performed prior to the initiation of the procedure. The patient was positioned prone and non-contrast localization CT was performed of the pelvis to demonstrate the iliac marrow spaces. The operative site was prepped and draped in the usual sterile fashion. Under sterile conditions and local anesthesia, a 22 gauge spinal needle was utilized for procedural planning. Next, an 11 gauge coaxial bone biopsy needle was advanced into the left iliac marrow space. Needle position was confirmed with CT imaging. Initially, bone marrow aspiration was performed. Next, a bone marrow biopsy was obtained with the 11 gauge outer bone marrow device. The 11 gauge coaxial bone biopsy needle was re-advanced into a slightly different  location within the left iliac marrow space, positioning was confirmed and an additional bone marrow biopsy was obtained. Samples were prepared with the cytotechnologist and deemed adequate. The needle was removed intact. Hemostasis was obtained with compression and a dressing was placed. The patient tolerated the procedure well without immediate post procedural complication. IMPRESSION: Successful CT guided left iliac bone marrow aspiration and core biopsy. Electronically Signed   By: Sandi Mariscal M.D.   On: 09/04/2017 10:33

## 2017-09-30 DIAGNOSIS — L308 Other specified dermatitis: Secondary | ICD-10-CM | POA: Insufficient documentation

## 2017-09-30 DIAGNOSIS — R61 Generalized hyperhidrosis: Secondary | ICD-10-CM | POA: Insufficient documentation

## 2017-09-30 DIAGNOSIS — R634 Abnormal weight loss: Secondary | ICD-10-CM | POA: Insufficient documentation

## 2017-09-30 DIAGNOSIS — L259 Unspecified contact dermatitis, unspecified cause: Secondary | ICD-10-CM | POA: Insufficient documentation

## 2017-09-30 NOTE — Progress Notes (Signed)
[facility] day:  10/01/2017   Chief Complaint: [name] is a 83 y.o. female with neutropenia who is seen for assessment and review of interval imaging.   HPI:  The patient was last seen in the hematology clinic on 09/11/2017. At that time, patient has felt well overall. Her night sweats were more intermittent and less frequent. She was having intermittent head sweating. She denies fevers, weight loss, or recurrent infections. Exam revealed a diffuse maculopapular rash. Additional lab studies were ordered.  Chest CT was ordered.  Labs on 09/11/2017 revealed a WBC of 3100 ([place] 2300). Hemoglobin 13.6, hematocrit 40.2, MCV 91.4, and platelets 267,000. ANA comprehensive panel was negative. CK was 36 (38-234).  Aldolase, C3 complement, and C4 complement were all normal.   Patient was seen in the [facility] ED on 09/21/2017 by Dr. Darel Hong for altered mental status and weakness. She reported RIGHT facial numbness.  She denied other focal neurological symptoms. WBC was 2100 ([place] 700). INR, troponin, and ammonia level all normal. EKG showed NSR without evidence of ST segment depression or elevation. CXR was normal.  Head CT revealed no acute intracranial abnormalities.  Patient and family wanted to be discharged in order to obtain CT scan ordered by oncology. Discharged home with strict return precautions.   Chest CT without contrast on 09/21/2017 provided no explanation for her symptoms. No evidence of metastatic breast cancer or other thoracic malignancy. Incidental finding of a 5 mm non-obstructing calculus in the upper pole of the LEFT kidney.   In the interim, patient has been "ok". She complains of more pain in the LEFT lower quadrant of her abdomen. Patient is eating more solid foods as of late. She continues to have intermittent head sweating. Her previously reported rash has resolved with the use of "that jar of stuff that Dr. Evorn Gong gave her".    She denies fevers and weight loss. She is eating well. Weight remains stable.   She denies pain in the clinic today.    Past Medical History:  Diagnosis Date  . Breast cancer ([facility])    1985  (bilateral mastectomy )  . Depression   . Diverticulosis   . GERD (gastroesophageal reflux disease)   . Hiatal hernia   . History of Bell's palsy   . History of colon polyps   . History of nephrolithiasis   . History of pancreatitis   . Hyperlipidemia   . IBS (irritable bowel syndrome)   . Osteoporosis   . Skin cancer   . Temporal arteritis ([place])   . Tubular adenoma of colon     Past Surgical History:  Procedure Laterality Date  . LAPAROSCOPIC CHOLECYSTECTOMY  2009  . MASTECTOMY  1983   bilateral    Family History  Problem Relation Age of Onset  . Colon cancer Mother 57  . Scleroderma Father 62  . Alcoholism Brother   . Stomach cancer Neg Hx     Social History:  reports that she quit smoking about 12 years ago. Her smoking use included cigarettes. She has a 7.50 pack-year smoking history. She has never used smokeless tobacco. She reports that she does not drink alcohol or use drugs. Patient smoked about 5 cigarettes a day for about 30 years; stopped in 2006. 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 Patient denies known exposures to radiation on toxins. Patient has 2 adult children. The patient is accompanied by her daughter today.  Allergies:  Allergies  Allergen  Reactions  . Benzalkonium Chloride Dermatitis    Other reaction(s): Unknown  . Escitalopram     Deveioped a twitch  . Neomycin-Bacitracin Zn-Polymyx Dermatitis    Other reaction(s): Other (See Comments) REACTION: blisters skin REACTION: blisters skin REACTION: blisters skin    Current Medications: Current Outpatient Medications  Medication Sig Dispense Refill  . ALPRAZolam (XANAX) 0.25 MG tablet Take 0.25 mg by mouth 2 (two) times daily as needed.     Marland Kitchen aspirin EC 81 MG tablet Take 81 mg  daily by mouth.    . Biotin 1000 MCG tablet Take by mouth.    . Cholecalciferol (VITAMIN D3) 2000 units capsule Take 2,000 Units daily by mouth.     . Cyanocobalamin (B-12 PO) Take 2,500 mcg daily by mouth.     . estradiol (ESTRACE) 1 MG tablet Take 1 mg daily by mouth.   0  . feeding supplement, ENSURE ENLIVE, (ENSURE ENLIVE) LIQD Take 237 mLs by mouth 2 (two) times daily between meals. 237 mL 12  . medroxyPROGESTERone (PROVERA) 2.5 MG tablet Take 2.5 mg by mouth daily.     . predniSONE (DELTASONE) 5 MG tablet Take 5 mg by mouth daily.     . traMADol (ULTRAM) 50 MG tablet Take 50 mg by mouth daily as needed. One tablet by mouth twice daily     . dicyclomine (BENTYL) 20 MG tablet Take 1 tablet (20 mg total) by mouth 3 (three) times daily before meals. (Patient not taking: Reported on 09/11/2017) 90 tablet 3  . fluticasone (FLONASE) 50 MCG/ACT nasal spray Place into the nose.    . ondansetron (ZOFRAN ODT) 4 MG disintegrating tablet Take 1 tablet (4 mg total) every 8 (eight) hours as needed by mouth for nausea or vomiting. (Patient not taking: Reported on 06/14/2017) 20 tablet 0   No current facility-administered medications for this visit.     Review of Systems  Constitutional: Positive for diaphoresis (head sweats; night sweats have resolved) and weight loss (Weight down 22 pounds since 03/2017). Negative for fever and malaise/fatigue.  HENT: Negative for nosebleeds and sore throat.        Chronic rhinorrhea  Eyes: Negative.   Respiratory: Negative for cough, hemoptysis, sputum production and shortness of breath.   Cardiovascular: Negative for chest pain, palpitations, orthopnea, leg swelling and PND.  Gastrointestinal: Positive for abdominal pain (LEFT lower quadrant). Negative for blood in stool, constipation, diarrhea, melena, nausea and vomiting.  Genitourinary: Negative for dysuria, frequency, hematuria and urgency.       Known 5 mm nephrolithiasis in LEFT kidney  Musculoskeletal:  Negative for back pain, falls, joint pain and myalgias.  Skin: Positive for rash (resolved). Negative for itching.  Neurological: Negative for dizziness, tremors, weakness and headaches.  Endo/Heme/Allergies: Does not bruise/bleed easily.  Psychiatric/Behavioral: Negative for depression, memory loss and suicidal ideas. The patient is not nervous/anxious and does not have insomnia.   All other systems reviewed and are negative.  Physical Exam: Blood pressure (!) 159/79, pulse (!) 106, temperature 99.4 F (37.4 C), temperature source Tympanic, weight 153 lb 7 oz (69.6 kg). GENERAL:  Well developed, well nourished, elderly woman sitting comfortably in the exam room in no acute distress. MENTAL STATUS:  Alert and oriented to person, place and time. HEAD:  Short blonde hair.  Normocephalic, atraumatic, face symmetric, no Cushingoid features. EYES:  Blue eyes.  Pupils equal round and reactive to light and accomodation.  No conjunctivitis or scleral icterus. ENT:  Oropharynx clear without lesion.  Tongue  normal. Mucous membranes moist.  RESPIRATORY:  Clear to auscultation without rales, wheezes or rhonchi. CARDIOVASCULAR:  Regular rate and rhythm without murmur, rub or gallop. ABDOMEN:  Soft, non-tender, with active bowel sounds, and no hepatosplenomegaly.  No masses. SKIN:  No rashes, ulcers or lesions. EXTREMITIES: No edema, no skin discoloration or tenderness.  No palpable cords. LYMPH NODES: No palpable cervical, supraclavicular, axillary or inguinal adenopathy  NEUROLOGICAL: Unremarkable. PSYCH:  Appropriate.    No visits with results within 3 Day(s) from this visit.  Latest known visit with results is:  Admission on 09/21/2017, Discharged on 09/21/2017  Component Date Value Ref Range Status  . Sodium 09/21/2017 139  135 - 145 mmol/L Final  . Potassium 09/21/2017 3.7  3.5 - 5.1 mmol/L Final  . Chloride 09/21/2017 106  101 - 111 mmol/L Final  . CO2 09/21/2017 28  22 - 32 mmol/L Final  .  Glucose, Bld 09/21/2017 92  65 - 99 mg/dL Final  . BUN 09/21/2017 8  6 - 20 mg/dL Final  . Creatinine, Ser 09/21/2017 0.95  0.44 - 1.00 mg/dL Final  . Calcium 09/21/2017 8.5* 8.9 - 10.3 mg/dL Final  . Total Protein 09/21/2017 6.6  6.5 - 8.1 g/dL Final  . Albumin 09/21/2017 3.4* 3.5 - 5.0 g/dL Final  . AST 09/21/2017 22  15 - 41 U/L Final  . ALT 09/21/2017 12* 14 - 54 U/L Final  . Alkaline Phosphatase 09/21/2017 111  38 - 126 U/L Final  . Total Bilirubin 09/21/2017 0.9  0.3 - 1.2 mg/dL Final  . GFR calc non Af Amer 09/21/2017 55* >60 mL/min Final  . GFR calc Af Amer 09/21/2017 >60  >60 mL/min Final   Comment: (NOTE) The eGFR has been calculated using the CKD EPI equation. This calculation has not been validated in all clinical situations. eGFR's persistently <60 mL/min signify possible Chronic Kidney Disease.   Georgiann Hahn gap 09/21/2017 5  5 - 15 Final   Performed at [facility], [address] [place], [place] [address]  . WBC 09/21/2017 2.1* 3.6 - 11.0 K/uL Final  . RBC 09/21/2017 4.09  3.80 - 5.20 MIL/uL Final  . Hemoglobin 09/21/2017 12.6  12.0 - 16.0 g/dL Final  . HCT 09/21/2017 37.4  35.0 - 47.0 % Final  . MCV 09/21/2017 91.5  80.0 - 100.0 fL Final  . MCH 09/21/2017 30.8  26.0 - 34.0 pg Final  . MCHC 09/21/2017 33.7  32.0 - 36.0 g/dL Final  . RDW 09/21/2017 15.3* 11.5 - 14.5 % Final  . Platelets 09/21/2017 226  150 - 440 K/uL Final  . Neutrophils Relative % 09/21/2017 32  % Final  . Neutro Abs 09/21/2017 0.7* 1.4 - 6.5 K/uL Final  . Lymphocytes Relative 09/21/2017 49  % Final  . Lymphs Abs 09/21/2017 1.0  1.0 - 3.6 K/uL Final  . Monocytes Relative 09/21/2017 17  % Final  . Monocytes Absolute 09/21/2017 0.3  0.2 - 0.9 K/uL Final  . Eosinophils Relative 09/21/2017 1  % Final  . Eosinophils Absolute 09/21/2017 0.0  0 - 0.7 K/uL Final  . Basophils Relative 09/21/2017 1  % Final  . Basophils Absolute 09/21/2017 0.0  0 - 0.1 K/uL Final   Performed at [facility], [address] [place], [place] [address]  . Prothrombin Time 09/21/2017 12.3  11.4 - 15.2 seconds Final  . INR 09/21/2017 0.92   Final   Performed at [facility], [address] [place], [place] [address]  .  Troponin I 09/21/2017 <0.03  <0.03 ng/mL Final   Performed at [facility], [address] [place], [place] [address]  . Ammonia 09/21/2017 9  9 - 35 umol/L Final   Performed at [facility], [address] [place], [place] [address]    Assessment:  [name] is a 83 y.o. female with progressive leukopenia since 03/2017.  She has had ongoing issues with diverticulitis and has been on multiple rounds of antibiotics.  She is being considered for a bowel resection.  She denies any new medications or herbal products.  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  Work-up on 08/20/2017 revealed a hematocrit of 40.8, hemoglobin 13.3, MCV 91.6, platelets 221,000, WBC 1800 with an ANC of 1100.  CMP was unremarkable.  ANA was negative.  HIV antibody, hepatitis C antibody, hepatitis B core antibody total, and hepatitis B surface antigen were normal.  Folate and copper were normal.  B12 was 3389 (high).  SPEP revealed no monoclonal protein. TSH was normal.  Bone marrow aspirate and biopsy on 09/04/2017 revealed a hypercellular marrow for age with trilineage hematopoiesis.  There was abundant mature neutrophils.  Significant dyspoiesis or increased blasts was not identified.  Flow cytometry revealed a predominance of T lymphocytes (16% of all cells) with no abnormal phenotype.  There was a minor B-cell population (13% of lymphocytes) with slight kappa light chain excess.  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  She was diagnosed with temporal arteritis 17 years ago.  She has been on chronic prednisone.  Her current maintenance dose is 2.5 mg a day.  She has a diffuse maculopapular rash.  Skin biopsy on 30/16/0109 revealed lichenoid  interface dermatitis with patchy band-like infiltrate of lymphocytes in papillary dermis beneath an acanthotic epidermis that contained foci of pankeratosis and hyperkeratosis.  This pattern could be c/w lichen planus, lichenoid drug eruption, lichenoid variant of connective tissue disease, and pityriasis lichenoides.  Head CT on 04.26/2019 revealed no acute intracranial abnormalities.  Chest CT on 09/21/2017 revealed no evidence of metastatic breast cancer or other thoracic malignancy. Incidental finding of a 5 mm non-obstructing calculus in the upper pole of the LEFT kidney.   Symptomatically, she is doing well. Rash has resolved with prescribed topical.  She is eating more solids foods, which seems to have increased LLQ abdominal pain. She has lost 22 pounds since 03/2017.  She has some head sweats.  Exam is stable.    Plan: 1.  Discuss interval ED visit. Head CT negative. Leukopenia persists ([place] 700).  2.  Review chest CT results - no evidence of metastatic breast cancer or other thoracic malignancy. 5 mm nephrolithiasis in LEFT kidney.  3.  Review labs from 09/11/2017. 4.  Discuss cytogenetics.  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  No evidence of MDS on marrow. 5.  Discuss leukopenia. Discuss likely autoimmune etiology.  Will remain on corticosteroid dose (2.5 mg prednisone) as prescribed by Dr. Jefm Bryant. Dicussed treatment with GCSF injections. Discuss initial GCSF every other day with monitoring of CBC.  Patient will need to take loratadine to prevent bone pain. Patient agrees with proposed plan of care.  6.  Preauthorize GCSF. Will call patient when medication approved.  7.  RTC in 1 week for MD assessment and labs (CBC with diff) and GCSF.    Honor Loh, NP  10/01/17, 4:20 PM  I saw and evaluated the patient, participating in the key portions of the service and reviewing pertinent  diagnostic studies and records.  I reviewed the nurse practitioner's note and agree with the findings and the plan.  The assessment and plan were discussed with the patient.  Multiple questions were asked by the patient and answered.   Nolon Stalls, MD 10/01/17, 4:20 PM

## 2017-10-01 ENCOUNTER — Encounter: Payer: Self-pay | Admitting: Hematology and Oncology

## 2017-10-01 ENCOUNTER — Inpatient Hospital Stay: Payer: Medicare Other | Attending: Hematology and Oncology | Admitting: Hematology and Oncology

## 2017-10-01 VITALS — BP 159/79 | HR 106 | Temp 99.4°F | Wt 153.4 lb

## 2017-10-01 DIAGNOSIS — N390 Urinary tract infection, site not specified: Secondary | ICD-10-CM | POA: Diagnosis not present

## 2017-10-01 DIAGNOSIS — I776 Arteritis, unspecified: Secondary | ICD-10-CM | POA: Diagnosis not present

## 2017-10-01 DIAGNOSIS — Z853 Personal history of malignant neoplasm of breast: Secondary | ICD-10-CM | POA: Insufficient documentation

## 2017-10-01 DIAGNOSIS — D709 Neutropenia, unspecified: Secondary | ICD-10-CM | POA: Insufficient documentation

## 2017-10-01 DIAGNOSIS — Z800 Family history of malignant neoplasm of digestive organs: Secondary | ICD-10-CM | POA: Insufficient documentation

## 2017-10-01 DIAGNOSIS — R1032 Left lower quadrant pain: Secondary | ICD-10-CM | POA: Diagnosis not present

## 2017-10-01 DIAGNOSIS — D72819 Decreased white blood cell count, unspecified: Secondary | ICD-10-CM

## 2017-10-01 DIAGNOSIS — Z87891 Personal history of nicotine dependence: Secondary | ICD-10-CM

## 2017-10-01 DIAGNOSIS — Z7952 Long term (current) use of systemic steroids: Secondary | ICD-10-CM | POA: Diagnosis not present

## 2017-10-01 DIAGNOSIS — Z7982 Long term (current) use of aspirin: Secondary | ICD-10-CM | POA: Insufficient documentation

## 2017-10-01 DIAGNOSIS — M316 Other giant cell arteritis: Secondary | ICD-10-CM

## 2017-10-01 DIAGNOSIS — Z79899 Other long term (current) drug therapy: Secondary | ICD-10-CM | POA: Diagnosis not present

## 2017-10-01 DIAGNOSIS — L259 Unspecified contact dermatitis, unspecified cause: Secondary | ICD-10-CM

## 2017-10-01 DIAGNOSIS — L308 Other specified dermatitis: Secondary | ICD-10-CM

## 2017-10-01 NOTE — Progress Notes (Signed)
Patient here today for CT results.  Offers no complaints today. 

## 2017-10-05 ENCOUNTER — Other Ambulatory Visit: Payer: Self-pay | Admitting: Urgent Care

## 2017-10-05 DIAGNOSIS — D709 Neutropenia, unspecified: Secondary | ICD-10-CM

## 2017-10-07 NOTE — Progress Notes (Signed)
[facility] day:  10/08/2017   Chief Complaint: [name] is a 83 y.o. female with neutropenia who is seen for 1 week assessment and initiation of GCSF.  HPI:  The patient was last seen in the hematology clinic on 10/01/2017. At that time, patient complained of pain in the LLQ of her abdomen. She attributed increase in pain to eating more solid foods. She continued to have head sweats (intermittent). Rash had improved with topical prescribed by dermatology. Exam was stable.  In the interim, patient is "wobbly" today. She was diagnosed with a urinary tract infection on Friday, 10/05/2017.  She is currently on a course of Cipro. Patient denies fevers. She is having chills. She continues to have LLQ abdominal pain and head sweats.   Patient eating ok. Weight is down 4 pounds. She denies pain.    Past Medical History:  Diagnosis Date  . Breast cancer ([place])    1985  (bilateral mastectomy )  . Depression   . Diverticulosis   . GERD (gastroesophageal reflux disease)   . Hiatal hernia   . History of Bell's palsy   . History of colon polyps   . History of nephrolithiasis   . History of pancreatitis   . Hyperlipidemia   . IBS (irritable bowel syndrome)   . Osteoporosis   . Skin cancer   . Temporal arteritis ([place])   . Tubular adenoma of colon     Past Surgical History:  Procedure Laterality Date  . LAPAROSCOPIC CHOLECYSTECTOMY  2009  . MASTECTOMY  1983   bilateral    Family History  Problem Relation Age of Onset  . Colon cancer Mother 35  . Scleroderma Father 49  . Alcoholism Brother   . Stomach cancer Neg Hx     Social History:  reports that she quit smoking about 12 years ago. Her smoking use included cigarettes. She has a 7.50 pack-year smoking history. She has never used smokeless tobacco. She reports that she does not drink alcohol or use drugs. Patient smoked about 5 cigarettes a day for about 30 years; stopped in 2006.  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 Patient denies known exposures to radiation on toxins. Patient has 2 adult children. The patient is accompanied by her daughter today.  Allergies:  Allergies  Allergen Reactions  . Benzalkonium Chloride Dermatitis    Other reaction(s): Unknown  . Escitalopram     Deveioped a twitch  . Neomycin-Bacitracin Zn-Polymyx Dermatitis    Other reaction(s): Other (See Comments) REACTION: blisters skin REACTION: blisters skin REACTION: blisters skin    Current Medications: Current Outpatient Medications  Medication Sig Dispense Refill  . ALPRAZolam (XANAX) 0.25 MG tablet Take 0.25 mg by mouth 2 (two) times daily as needed.     Marland Kitchen aspirin EC 81 MG tablet Take 81 mg daily by mouth.    . Biotin 1000 MCG tablet Take by mouth.    . Cholecalciferol (VITAMIN D3) 2000 units capsule Take 2,000 Units daily by mouth.     . ciprofloxacin (CIPRO) 500 MG/5ML (10%) suspension Take 500 mg by mouth 2 (two) times daily.    . Cyanocobalamin (B-12 PO) Take 2,500 mcg daily by mouth.     . estradiol (ESTRACE) 1 MG tablet Take 1 mg daily by mouth.   0  . feeding supplement, ENSURE ENLIVE, (ENSURE ENLIVE) LIQD Take 237 mLs by mouth 2 (two) times daily between meals. 237 mL 12  . fluticasone (FLONASE) 50  MCG/ACT nasal spray Place into the nose.    . medroxyPROGESTERone (PROVERA) 2.5 MG tablet Take 2.5 mg by mouth daily.     . predniSONE (DELTASONE) 5 MG tablet Take 5 mg by mouth daily.     . traMADol (ULTRAM) 50 MG tablet Take 50 mg by mouth daily as needed. One tablet by mouth twice daily     . dicyclomine (BENTYL) 20 MG tablet Take 1 tablet (20 mg total) by mouth 3 (three) times daily before meals. (Patient not taking: Reported on 09/11/2017) 90 tablet 3  . ondansetron (ZOFRAN ODT) 4 MG disintegrating tablet Take 1 tablet (4 mg total) every 8 (eight) hours as needed by mouth for nausea or vomiting. (Patient not taking: Reported on 06/14/2017) 20 tablet 0   No  current facility-administered medications for this visit.     Review of Systems  Constitutional: Positive for chills, diaphoresis (night sweats resolved) and weight loss (down 22 pounds since 03/2017; 4 pounds since last visit). Negative for fever and malaise/fatigue.       Feels "fine except not on game"  HENT: Negative.  Negative for congestion, ear pain, nosebleeds, sinus pain and sore throat.        Chronic rhinorrhea  Eyes: Negative.  Negative for blurred vision and redness.  Respiratory: Negative.  Negative for cough, hemoptysis, sputum production and shortness of breath.   Cardiovascular: Negative.  Negative for chest pain, palpitations, orthopnea, leg swelling and PND.  Gastrointestinal: Positive for abdominal pain (LLQ). Negative for blood in stool, constipation, diarrhea, melena, nausea and vomiting.  Genitourinary: Positive for dysuria, frequency and urgency. Negative for hematuria.       No CVA tenderness. Currently on Cipro for UTI. Known 29mm nephrolithiasis in LEFT kidney  Musculoskeletal: Negative for back pain, falls, joint pain and myalgias.  Skin: Positive for rash (resolved). Negative for itching.  Neurological: Positive for weakness ("wobbly" ). Negative for dizziness, tremors and headaches.  Endo/Heme/Allergies: Does not bruise/bleed easily.  Psychiatric/Behavioral: Negative for depression, memory loss and suicidal ideas. The patient is not nervous/anxious and does not have insomnia.   All other systems reviewed and are negative.  Physical Exam: Blood pressure (!) 154/69, pulse (!) 101, temperature 98.4 F (36.9 C), temperature source Tympanic, resp. rate 18, weight 149 lb 4 oz (67.7 kg). GENERAL:  Well developed, well nourished, woman sitting comfortably in the exam room in no acute distress. MENTAL STATUS:  Alert and oriented to person, place and time. HEAD:  Short blonde hair.  Normocephalic, atraumatic, face symmetric, no Cushingoid features. EYES:  Blue eyes.   Pupils equal round and reactive to light and accomodation.  No conjunctivitis or scleral icterus. ENT:  Oropharynx clear without lesion.  Tongue normal. Mucous membranes moist.  RESPIRATORY:  Clear to auscultation without rales, wheezes or rhonchi. CARDIOVASCULAR:  Regular rate and rhythm without murmur, rub or gallop. ABDOMEN:  Soft, non-tender, with active bowel sounds, and no hepatosplenomegaly.  No masses. SKIN:  No rashes, ulcers or lesions. EXTREMITIES: No edema, no skin discoloration or tenderness.  No palpable cords. LYMPH NODES: No palpable cervical, supraclavicular, axillary or inguinal adenopathy  NEUROLOGICAL: Unremarkable. PSYCH:  Appropriate.    Appointment on 10/08/2017  Component Date Value Ref Range Status  . WBC 10/08/2017 3.6  3.6 - 11.0 K/uL Final  . RBC 10/08/2017 4.26  3.80 - 5.20 MIL/uL Final  . Hemoglobin 10/08/2017 13.1  12.0 - 16.0 g/dL Final  . HCT 10/08/2017 39.1  35.0 - 47.0 % Final  . MCV  10/08/2017 91.8  80.0 - 100.0 fL Final  . MCH 10/08/2017 30.6  26.0 - 34.0 pg Final  . MCHC 10/08/2017 33.4  32.0 - 36.0 g/dL Final  . RDW 10/08/2017 13.8  11.5 - 14.5 % Final  . Platelets 10/08/2017 310  150 - 440 K/uL Final  . Neutrophils Relative % 10/08/2017 67  % Final  . Neutro Abs 10/08/2017 2.4  1.4 - 6.5 K/uL Final  . Lymphocytes Relative 10/08/2017 24  % Final  . Lymphs Abs 10/08/2017 0.9* 1.0 - 3.6 K/uL Final  . Monocytes Relative 10/08/2017 7  % Final  . Monocytes Absolute 10/08/2017 0.2  0.2 - 0.9 K/uL Final  . Eosinophils Relative 10/08/2017 1  % Final  . Eosinophils Absolute 10/08/2017 0.0  0 - 0.7 K/uL Final  . Basophils Relative 10/08/2017 1  % Final  . Basophils Absolute 10/08/2017 0.0  0 - 0.1 K/uL Final   Performed at [facility], [address] [place], [place] [address]    Assessment:  [name] is a 83 y.o. female with progressive leukopenia since 03/2017.  She has had ongoing issues with diverticulitis and has been on  multiple rounds of antibiotics.  She is being considered for a bowel resection.  She denies any new medications or herbal products.  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  Work-up on 08/20/2017 revealed a hematocrit of 40.8, hemoglobin 13.3, MCV 91.6, platelets 221,000, WBC 1800 with an ANC of 1100.  CMP was unremarkable.  ANA was negative.  HIV antibody, hepatitis C antibody, hepatitis B core antibody total, and hepatitis B surface antigen were normal.  Folate and copper were normal.  B12 was 3389 (high).  SPEP revealed no monoclonal protein. TSH was normal.  Bone marrow aspirate and biopsy on 09/04/2017 revealed a hypercellular marrow for age with trilineage hematopoiesis.  There was abundant mature neutrophils.  Significant dyspoiesis or increased blasts was not identified.  Flow cytometry revealed a predominance of T lymphocytes (16% of all cells) with no abnormal phenotype.  There was a minor B-cell population (13% of lymphocytes) with slight kappa light chain excess.  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  She was diagnosed with temporal arteritis 17 years ago.  She has been on chronic prednisone.  Her current maintenance dose is 2.5 mg a day.  She has a diffuse maculopapular rash.  Skin biopsy on 61/95/0932 revealed lichenoid interface dermatitis with patchy band-like infiltrate of lymphocytes in papillary dermis beneath an acanthotic epidermis that contained foci of pankeratosis and hyperkeratosis.  This pattern could be c/w lichen planus, lichenoid drug eruption, lichenoid variant of connective tissue disease, and pityriasis lichenoides.  Head CT on 04.26/2019 revealed no acute intracranial abnormalities.  Chest CT on 09/21/2017 revealed no evidence of metastatic breast cancer or other thoracic malignancy. Incidental finding of a 5 mm non-obstructing calculus in the upper pole of the LEFT kidney.   Symptomatically, she is currently on Cipro for a UTI.  She denies  fevers, but has chills. She is "wobbly".  Rash has resolved. She has continued LLQ abdominal pain. Weight is down 4 pounds. Exam is stable. WBC 3600 ([place] 2400).    Plan: 1.  Labs today: CBC with diff 2.  Discuss leukopenia. Discuss likely autoimmune etiology.  Will remain on corticosteroid dose (2.5 mg prednisone) as prescribed by Dr. Jefm Bryant. Dicussed treatment with GCSF injections. Discuss initial GCSF every other day with monitoring of CBC.  Patient will need to take loratadine to prevent bone  pain. Patient agrees with proposed plan of care. Counts up, which could be secondary to her acute infection. Will defer initial GCSF injection.   3.  RTC on Wednesday and Friday for labs (CBC with diff) and GCSF. 4.  RTC in 1 week for MD assessment and labs (CBC with diff) and GCSF.    Honor Loh, NP  10/08/17, 10:29 AM   I saw and evaluated the patient, participating in the key portions of the service and reviewing pertinent diagnostic studies and records.  I reviewed the nurse practitioner's note and agree with the findings and the plan.  The assessment and plan were discussed with the patient.  Several questions were asked by the patient and answered.   Nolon Stalls, MD 10/08/17, 10:29 AM

## 2017-10-08 ENCOUNTER — Inpatient Hospital Stay: Payer: Medicare Other

## 2017-10-08 ENCOUNTER — Inpatient Hospital Stay (HOSPITAL_BASED_OUTPATIENT_CLINIC_OR_DEPARTMENT_OTHER): Payer: Medicare Other | Admitting: Hematology and Oncology

## 2017-10-08 ENCOUNTER — Encounter: Payer: Self-pay | Admitting: Hematology and Oncology

## 2017-10-08 VITALS — BP 154/69 | HR 101 | Temp 98.4°F | Resp 18 | Wt 149.2 lb

## 2017-10-08 DIAGNOSIS — Z87891 Personal history of nicotine dependence: Secondary | ICD-10-CM | POA: Diagnosis not present

## 2017-10-08 DIAGNOSIS — Z7982 Long term (current) use of aspirin: Secondary | ICD-10-CM

## 2017-10-08 DIAGNOSIS — Z79899 Other long term (current) drug therapy: Secondary | ICD-10-CM

## 2017-10-08 DIAGNOSIS — D708 Other neutropenia: Secondary | ICD-10-CM

## 2017-10-08 DIAGNOSIS — R1032 Left lower quadrant pain: Secondary | ICD-10-CM

## 2017-10-08 DIAGNOSIS — D709 Neutropenia, unspecified: Secondary | ICD-10-CM

## 2017-10-08 DIAGNOSIS — Z800 Family history of malignant neoplasm of digestive organs: Secondary | ICD-10-CM | POA: Diagnosis not present

## 2017-10-08 DIAGNOSIS — N390 Urinary tract infection, site not specified: Secondary | ICD-10-CM

## 2017-10-08 DIAGNOSIS — Z7952 Long term (current) use of systemic steroids: Secondary | ICD-10-CM | POA: Diagnosis not present

## 2017-10-08 DIAGNOSIS — Z853 Personal history of malignant neoplasm of breast: Secondary | ICD-10-CM | POA: Diagnosis not present

## 2017-10-08 LAB — CBC WITH DIFFERENTIAL/PLATELET
BASOS ABS: 0 10*3/uL (ref 0–0.1)
Basophils Relative: 1 %
EOS ABS: 0 10*3/uL (ref 0–0.7)
Eosinophils Relative: 1 %
HCT: 39.1 % (ref 35.0–47.0)
HEMOGLOBIN: 13.1 g/dL (ref 12.0–16.0)
LYMPHS PCT: 24 %
Lymphs Abs: 0.9 10*3/uL — ABNORMAL LOW (ref 1.0–3.6)
MCH: 30.6 pg (ref 26.0–34.0)
MCHC: 33.4 g/dL (ref 32.0–36.0)
MCV: 91.8 fL (ref 80.0–100.0)
Monocytes Absolute: 0.2 10*3/uL (ref 0.2–0.9)
Monocytes Relative: 7 %
NEUTROS PCT: 67 %
Neutro Abs: 2.4 10*3/uL (ref 1.4–6.5)
Platelets: 310 10*3/uL (ref 150–440)
RBC: 4.26 MIL/uL (ref 3.80–5.20)
RDW: 13.8 % (ref 11.5–14.5)
WBC: 3.6 10*3/uL (ref 3.6–11.0)

## 2017-10-08 NOTE — Progress Notes (Signed)
Patient saw PCP on Friday for UTI.  Currently on Cipro 500 mg bid.  Otherwise, offers no complaints.

## 2017-10-10 ENCOUNTER — Inpatient Hospital Stay: Payer: Medicare Other

## 2017-10-10 DIAGNOSIS — D709 Neutropenia, unspecified: Secondary | ICD-10-CM | POA: Diagnosis not present

## 2017-10-10 DIAGNOSIS — D72819 Decreased white blood cell count, unspecified: Secondary | ICD-10-CM

## 2017-10-10 LAB — CBC WITH DIFFERENTIAL/PLATELET
Basophils Absolute: 0 10*3/uL (ref 0–0.1)
Basophils Relative: 0 %
Eosinophils Absolute: 0 10*3/uL (ref 0–0.7)
Eosinophils Relative: 0 %
HCT: 38.1 % (ref 35.0–47.0)
Hemoglobin: 12.6 g/dL (ref 12.0–16.0)
Lymphocytes Relative: 9 %
Lymphs Abs: 0.5 10*3/uL — ABNORMAL LOW (ref 1.0–3.6)
MCH: 30.4 pg (ref 26.0–34.0)
MCHC: 33.2 g/dL (ref 32.0–36.0)
MCV: 91.7 fL (ref 80.0–100.0)
Monocytes Absolute: 0.2 10*3/uL (ref 0.2–0.9)
Monocytes Relative: 4 %
Neutro Abs: 4.9 10*3/uL (ref 1.4–6.5)
Neutrophils Relative %: 87 %
Platelets: 305 10*3/uL (ref 150–440)
RBC: 4.15 MIL/uL (ref 3.80–5.20)
RDW: 13.9 % (ref 11.5–14.5)
WBC: 5.7 10*3/uL (ref 3.6–11.0)

## 2017-10-11 ENCOUNTER — Inpatient Hospital Stay
Admission: EM | Admit: 2017-10-11 | Discharge: 2017-10-18 | DRG: 329 | Disposition: A | Discharge status: Home Health | Payer: Medicare Other | Attending: Internal Medicine | Admitting: Internal Medicine

## 2017-10-11 ENCOUNTER — Other Ambulatory Visit: Payer: Self-pay

## 2017-10-11 ENCOUNTER — Emergency Department: Payer: Medicare Other

## 2017-10-11 ENCOUNTER — Encounter: Payer: Self-pay | Admitting: Emergency Medicine

## 2017-10-11 DIAGNOSIS — Z811 Family history of alcohol abuse and dependence: Secondary | ICD-10-CM | POA: Diagnosis not present

## 2017-10-11 DIAGNOSIS — N200 Calculus of kidney: Secondary | ICD-10-CM | POA: Diagnosis present

## 2017-10-11 DIAGNOSIS — Z800 Family history of malignant neoplasm of digestive organs: Secondary | ICD-10-CM

## 2017-10-11 DIAGNOSIS — Z85828 Personal history of other malignant neoplasm of skin: Secondary | ICD-10-CM | POA: Diagnosis not present

## 2017-10-11 DIAGNOSIS — Z7982 Long term (current) use of aspirin: Secondary | ICD-10-CM

## 2017-10-11 DIAGNOSIS — D709 Neutropenia, unspecified: Secondary | ICD-10-CM | POA: Diagnosis present

## 2017-10-11 DIAGNOSIS — Z881 Allergy status to other antibiotic agents status: Secondary | ICD-10-CM | POA: Diagnosis not present

## 2017-10-11 DIAGNOSIS — Z7952 Long term (current) use of systemic steroids: Secondary | ICD-10-CM | POA: Diagnosis not present

## 2017-10-11 DIAGNOSIS — I700 Atherosclerosis of aorta: Secondary | ICD-10-CM | POA: Diagnosis present

## 2017-10-11 DIAGNOSIS — K5720 Diverticulitis of large intestine with perforation and abscess without bleeding: Secondary | ICD-10-CM | POA: Diagnosis present

## 2017-10-11 DIAGNOSIS — K651 Peritoneal abscess: Secondary | ICD-10-CM | POA: Diagnosis present

## 2017-10-11 DIAGNOSIS — I739 Peripheral vascular disease, unspecified: Secondary | ICD-10-CM | POA: Diagnosis present

## 2017-10-11 DIAGNOSIS — A419 Sepsis, unspecified organism: Secondary | ICD-10-CM | POA: Diagnosis present

## 2017-10-11 DIAGNOSIS — E785 Hyperlipidemia, unspecified: Secondary | ICD-10-CM | POA: Diagnosis present

## 2017-10-11 DIAGNOSIS — Z9013 Acquired absence of bilateral breasts and nipples: Secondary | ICD-10-CM

## 2017-10-11 DIAGNOSIS — M316 Other giant cell arteritis: Secondary | ICD-10-CM | POA: Diagnosis present

## 2017-10-11 DIAGNOSIS — Z87891 Personal history of nicotine dependence: Secondary | ICD-10-CM | POA: Diagnosis not present

## 2017-10-11 DIAGNOSIS — N390 Urinary tract infection, site not specified: Secondary | ICD-10-CM | POA: Diagnosis present

## 2017-10-11 DIAGNOSIS — Z9049 Acquired absence of other specified parts of digestive tract: Secondary | ICD-10-CM

## 2017-10-11 DIAGNOSIS — K5740 Diverticulitis of both small and large intestine with perforation and abscess without bleeding: Secondary | ICD-10-CM | POA: Diagnosis present

## 2017-10-11 DIAGNOSIS — Z86010 Personal history of colonic polyps: Secondary | ICD-10-CM | POA: Diagnosis not present

## 2017-10-11 DIAGNOSIS — K219 Gastro-esophageal reflux disease without esophagitis: Secondary | ICD-10-CM | POA: Diagnosis present

## 2017-10-11 DIAGNOSIS — Z87442 Personal history of urinary calculi: Secondary | ICD-10-CM | POA: Diagnosis not present

## 2017-10-11 DIAGNOSIS — M810 Age-related osteoporosis without current pathological fracture: Secondary | ICD-10-CM | POA: Diagnosis present

## 2017-10-11 DIAGNOSIS — K660 Peritoneal adhesions (postprocedural) (postinfection): Secondary | ICD-10-CM | POA: Diagnosis present

## 2017-10-11 DIAGNOSIS — F329 Major depressive disorder, single episode, unspecified: Secondary | ICD-10-CM | POA: Diagnosis present

## 2017-10-11 DIAGNOSIS — I10 Essential (primary) hypertension: Secondary | ICD-10-CM | POA: Diagnosis present

## 2017-10-11 DIAGNOSIS — Z888 Allergy status to other drugs, medicaments and biological substances status: Secondary | ICD-10-CM | POA: Diagnosis not present

## 2017-10-11 DIAGNOSIS — E860 Dehydration: Secondary | ICD-10-CM | POA: Diagnosis present

## 2017-10-11 DIAGNOSIS — Z853 Personal history of malignant neoplasm of breast: Secondary | ICD-10-CM | POA: Diagnosis not present

## 2017-10-11 DIAGNOSIS — Z7951 Long term (current) use of inhaled steroids: Secondary | ICD-10-CM

## 2017-10-11 LAB — TROPONIN I

## 2017-10-11 LAB — URINALYSIS, COMPLETE (UACMP) WITH MICROSCOPIC
BILIRUBIN URINE: NEGATIVE
GLUCOSE, UA: NEGATIVE mg/dL
KETONES UR: 20 mg/dL — AB
NITRITE: NEGATIVE
PH: 6 (ref 5.0–8.0)
Protein, ur: 100 mg/dL — AB
Specific Gravity, Urine: 1.012 (ref 1.005–1.030)

## 2017-10-11 LAB — COMPREHENSIVE METABOLIC PANEL
ALBUMIN: 3.4 g/dL — AB (ref 3.5–5.0)
ALK PHOS: 90 U/L (ref 38–126)
ALT: 11 U/L — ABNORMAL LOW (ref 14–54)
AST: 24 U/L (ref 15–41)
Anion gap: 10 (ref 5–15)
BILIRUBIN TOTAL: 1.1 mg/dL (ref 0.3–1.2)
BUN: 10 mg/dL (ref 6–20)
CO2: 24 mmol/L (ref 22–32)
Calcium: 8.7 mg/dL — ABNORMAL LOW (ref 8.9–10.3)
Chloride: 103 mmol/L (ref 101–111)
Creatinine, Ser: 0.93 mg/dL (ref 0.44–1.00)
GFR calc Af Amer: >60 mL/min (ref >60)
GFR, EST NON AFRICAN AMERICAN: 56 mL/min — AB (ref >60)
Glucose, Bld: 100 mg/dL — ABNORMAL HIGH (ref 65–99)
POTASSIUM: 3.7 mmol/L (ref 3.5–5.1)
Sodium: 137 mmol/L (ref 135–145)
Total Protein: 7.5 g/dL (ref 6.5–8.1)

## 2017-10-11 LAB — CBC
HEMATOCRIT: 37.6 % (ref 35.0–47.0)
Hemoglobin: 12.9 g/dL (ref 12.0–16.0)
MCH: 31 pg (ref 26.0–34.0)
MCHC: 34.3 g/dL (ref 32.0–36.0)
MCV: 90.4 fL (ref 80.0–100.0)
PLATELETS: 309 10*3/uL (ref 150–440)
RBC: 4.16 MIL/uL (ref 3.80–5.20)
RDW: 13.8 % (ref 11.5–14.5)
WBC: 3.7 10*3/uL (ref 3.6–11.0)

## 2017-10-11 LAB — LACTIC ACID, PLASMA
Lactic Acid, Venous: 1.4 mmol/L (ref 0.5–1.9)
Lactic Acid, Venous: 1 mmol/L (ref 0.5–1.9)

## 2017-10-11 LAB — LIPASE, BLOOD: Lipase: 28 U/L (ref 11–51)

## 2017-10-11 MED ORDER — KCL IN DEXTROSE-NACL 20-5-0.45 MEQ/L-%-% IV SOLN
INTRAVENOUS | Status: DC
  Administered 2017-10-11 – 2017-10-15 (×9): via INTRAVENOUS
  Filled 2017-10-11 (×11): qty 1000

## 2017-10-11 MED ORDER — ACETAMINOPHEN 650 MG RE SUPP: 650.0000 mg | Freq: Four times a day (QID) | RECTAL | Status: DC | PRN

## 2017-10-11 MED ORDER — IOPAMIDOL (ISOVUE-370) INJECTION 76%
75.0000 mL | Freq: Once | INTRAVENOUS | Status: AC | PRN
  Administered 2017-10-11: 75 mL via INTRAVENOUS

## 2017-10-11 MED ORDER — LACTATED RINGERS IV BOLUS: 500.0000 mL | Freq: Once | INTRAVENOUS | Status: DC

## 2017-10-11 MED ORDER — ACETAMINOPHEN 325 MG PO TABS: 650.0000 mg | ORAL_TABLET | Freq: Four times a day (QID) | ORAL | Status: DC | PRN

## 2017-10-11 MED ORDER — HYDROCORTISONE NA SUCCINATE PF 100 MG IJ SOLR
100.0000 mg | Freq: Four times a day (QID) | INTRAMUSCULAR | Status: DC
  Administered 2017-10-11 – 2017-10-12 (×4): 100 mg via INTRAVENOUS
  Filled 2017-10-11 (×4): qty 2

## 2017-10-11 MED ORDER — FAMOTIDINE IN NACL 20-0.9 MG/50ML-% IV SOLN
20.0000 mg | INTRAVENOUS | Status: DC
  Administered 2017-10-11 – 2017-10-16 (×5): 20 mg via INTRAVENOUS
  Filled 2017-10-11 (×5): qty 50

## 2017-10-11 MED ORDER — VANCOMYCIN HCL IN DEXTROSE 1-5 GM/200ML-% IV SOLN
1000.0000 mg | Freq: Once | INTRAVENOUS | Status: AC
  Administered 2017-10-11: 1000 mg via INTRAVENOUS
  Filled 2017-10-11: qty 200

## 2017-10-11 MED ORDER — ONDANSETRON HCL 4 MG/2ML IJ SOLN
4.0000 mg | Freq: Four times a day (QID) | INTRAMUSCULAR | Status: DC | PRN
  Administered 2017-10-13 (×2): 4 mg via INTRAVENOUS
  Filled 2017-10-11 (×2): qty 2

## 2017-10-11 MED ORDER — PREDNISONE 2.5 MG PO TABS: 2.5000 mg | ORAL_TABLET | Freq: Every day | ORAL | Status: DC

## 2017-10-11 MED ORDER — ONDANSETRON HCL 4 MG PO TABS: 4.0000 mg | ORAL_TABLET | Freq: Four times a day (QID) | ORAL | Status: DC | PRN

## 2017-10-11 MED ORDER — HYDROCODONE-ACETAMINOPHEN 5-325 MG PO TABS: 1.0000 | ORAL_TABLET | ORAL | Status: DC | PRN

## 2017-10-11 MED ORDER — ALPRAZOLAM 0.25 MG PO TABS
0.2500 mg | ORAL_TABLET | Freq: Two times a day (BID) | ORAL | Status: DC | PRN
  Administered 2017-10-11 – 2017-10-17 (×8): 0.25 mg via ORAL
  Filled 2017-10-11 (×8): qty 1

## 2017-10-11 MED ORDER — FLUTICASONE PROPIONATE 50 MCG/ACT NA SUSP
1.0000 | Freq: Every day | NASAL | Status: DC | PRN
  Filled 2017-10-11: qty 16

## 2017-10-11 MED ORDER — PIPERACILLIN-TAZOBACTAM 3.375 G IVPB 30 MIN: 3.3750 g | Freq: Four times a day (QID) | INTRAVENOUS | Status: DC

## 2017-10-11 MED ORDER — PIPERACILLIN-TAZOBACTAM 3.375 G IVPB
3.3750 g | Freq: Three times a day (TID) | INTRAVENOUS | Status: DC
  Administered 2017-10-11 – 2017-10-17 (×17): 3.375 g via INTRAVENOUS
  Filled 2017-10-11 (×17): qty 50

## 2017-10-11 MED ORDER — PIPERACILLIN-TAZOBACTAM 3.375 G IVPB 30 MIN
3.3750 g | Freq: Once | INTRAVENOUS | Status: AC
  Administered 2017-10-11: 3.375 g via INTRAVENOUS
  Filled 2017-10-11: qty 50

## 2017-10-11 MED ORDER — MORPHINE SULFATE (PF) 2 MG/ML IV SOLN
2.0000 mg | INTRAVENOUS | Status: DC | PRN
  Administered 2017-10-13: 2 mg via INTRAVENOUS
  Filled 2017-10-11: qty 1

## 2017-10-11 NOTE — Care Management (Signed)
Patient shows frequent presentations to hospital; last stay was in March. I do not see that patient needed outpatient services through home health. CM team will need to follow. Patient presents this time from PCP with concerns for worsening infection.

## 2017-10-11 NOTE — ED Triage Notes (Signed)
Pt in from Dr. Tillman Sers office. He reports pt with intermittent episodes of diverticulitis and abd abscess. Pt worse today and he is concerned for increasing infection.   Pt daughter reports pt also neutropenic. Pt c/o pain to her abdomen left side intermittently.

## 2017-10-11 NOTE — ED Provider Notes (Signed)
[facility] Emergency Department Provider Note       Time seen: ----------------------------------------- 9:51 AM on 10/11/2017 -----------------------------------------   I have reviewed the triage vital signs and the nursing notes.  HISTORY   Chief Complaint Abdominal Robbins    HPI [name] is a 83 y.o. female with a history of breast cancer, depression, GERD, IBS, temporal arteritis who presents to the ED for Reiger's and weakness.  She is also having left-sided abdominal Robbins.  She was sent by wheelchair from [facility] due to the symptoms.  Daughter reports she is also neutropenic, has intermittent left-sided abdominal Robbins and was recently treated for UTI with 5 days of Cipro.  Patient states she cannot get warm and she cannot stop shivering.  She tells me she had a bone marrow biopsy for neutropenia but no specific etiology was discovered.  Past Medical History:  Diagnosis Date  . Breast cancer ([place])    1985  (bilateral mastectomy )  . Depression   . Diverticulosis   . GERD (gastroesophageal reflux disease)   . Hiatal hernia   . History of Bell's palsy   . History of colon polyps   . History of nephrolithiasis   . History of pancreatitis   . Hyperlipidemia   . IBS (irritable bowel syndrome)   . Osteoporosis   . Skin cancer   . Temporal arteritis ([place])   . Tubular adenoma of colon     Patient Active Problem List   Diagnosis Date Noted  . Weight loss 09/30/2017  . Night sweats 09/30/2017  . Interface dermatitis, lichenoid type 78/29/5621  . Leukopenia 09/02/2017  . Maculopapular rash 09/02/2017  . Diverticulitis 08/02/2017  . Diverticulitis of large intestine with complication   . Diverticulitis of large intestine with perforation and abscess without bleeding   . Pyelonephritis   . Dehydration   . Abscess of sigmoid colon   . Acute diverticulitis 06/14/2017  . Atherosclerosis of native arteries of extremity with  intermittent claudication ([place]) 08/01/2016  . PAD (peripheral artery disease) ([place]) 05/10/2016  . Robbins in limb 05/02/2016  . Atrophic vaginitis 11/04/2014  . Cystocele, grade 2 11/04/2014  . Nephrolithiasis 11/04/2014  . H/O neoplasm 07/27/2014  . History of nonmelanoma skin cancer 07/27/2014  . Hyperlipidemia, unspecified 04/14/2014  . Osteoporosis 01/07/2014  . Osteoarthritis 01/07/2014  . Biliary tract disorder 01/07/2014  . Anosmia 03/19/2013  . PANCREATITIS 05/27/2008  . NAUSEA 05/27/2008  . DIARRHEA 05/27/2008  . ABDOMINAL Robbins-EPIGASTRIC 05/27/2008  . Temporal arteritis ([place]) 08/05/2007  . OSTEOPOROSIS 08/05/2007  . IRRITABLE BOWEL SYNDROME, HX OF 08/05/2007  . DIVERTICULOSIS, COLON 03/08/2007  . COLONIC POLYPS, ADENOMATOUS 10/28/1998  . GERD 10/28/1998  . HIATAL HERNIA 10/28/1998    Past Surgical History:  Procedure Laterality Date  . LAPAROSCOPIC CHOLECYSTECTOMY  2009  . MASTECTOMY  1983   bilateral    Allergies Benzalkonium chloride; Escitalopram; and Neomycin-bacitracin zn-polymyx  Social History Social History   Tobacco Use  . Smoking status: Former Smoker    Packs/day: 0.25    Years: 30.00    Pack years: 7.50    Types: Cigarettes    Last attempt to quit: 05/17/2005    Years since quitting: 12.4  . Smokeless tobacco: Never Used  . Tobacco comment: quit 11 years ago  Substance Use Topics  . Alcohol use: No    Alcohol/week: 0.0 oz  . Drug use: No   Review of Systems Constitutional: Negative for fever, positive for chills Cardiovascular: Negative for  chest Robbins. Respiratory: Negative for shortness of breath. Gastrointestinal: Positive for abdominal Robbins Genitourinary: Positive for dysuria Musculoskeletal: Negative for back Robbins. Skin: Negative for rash. Neurological: Negative for headaches, positive for weakness  All systems negative/normal/unremarkable except as stated in the HPI  ____________________________________________   PHYSICAL  EXAM:  VITAL SIGNS: ED Triage Vitals [10/11/17 0912]  Enc Vitals Group     BP (!) 152/46     Pulse Rate 96     Resp 20     Temp 98.4 F (36.9 C)     Temp Source Oral     SpO2 98 %     Weight 149 lb (67.6 kg)     Height '5\' 5"'  (1.651 m)     Head Circumference      Peak Flow      Robbins Score 5     Robbins Loc      Robbins Edu?      Excl. in [place]?    Constitutional: Alert and oriented.  Found to moderate distress with Rigors Eyes: Conjunctivae are normal. Normal extraocular movements. ENT   Head: Normocephalic and atraumatic.   Nose: No congestion/rhinnorhea.   Mouth/Throat: Mucous membranes are moist.   Neck: No stridor. Cardiovascular: Rapid rate, regular rhythm. No murmurs, rubs, or gallops. Respiratory: Normal respiratory effort without tachypnea nor retractions. Breath sounds are clear and equal bilaterally. No wheezes/rales/rhonchi. Gastrointestinal: Lower quadrant and suprapubic tenderness, normal bowel sounds. Musculoskeletal: Nontender with normal range of motion in extremities. No lower extremity tenderness nor edema. Neurologic:  Normal speech and language. No gross focal neurologic deficits are appreciated.  Skin:  Skin is warm, dry and intact. No rash noted. Psychiatric: Mood and affect are normal. Speech and behavior are normal.  ____________________________________________  EKG: Interpreted by me.  Sinus rhythm rate 93 bpm, normal PR interval, normal QRS, normal QT.  ____________________________________________  ED COURSE:  As part of my medical decision making, I reviewed the following data within the [place] History obtained from family if available, nursing notes, old chart and ekg, as well as notes from prior ED visits. Patient presented for possible sepsis, we will assess with labs and imaging as indicated at this time.   Procedures ____________________________________________   LABS (pertinent positives/negatives)  Labs  Reviewed  COMPREHENSIVE METABOLIC PANEL - Abnormal; Notable for the following components:      Result Value   Glucose, Bld 100 (*)    Calcium 8.7 (*)    Albumin 3.4 (*)    ALT 11 (*)    GFR calc non Af Amer 56 (*)    All other components within normal limits  URINALYSIS, COMPLETE (UACMP) WITH MICROSCOPIC - Abnormal; Notable for the following components:   Color, Urine YELLOW (*)    APPearance CLOUDY (*)    Hgb urine dipstick MODERATE (*)    Ketones, ur 20 (*)    Protein, ur 100 (*)    Leukocytes, UA TRACE (*)    Bacteria, UA RARE (*)    All other components within normal limits  BLOOD GAS, VENOUS - Abnormal; Notable for the following components:   pCO2, Ven 41 (*)    All other components within normal limits  CULTURE, BLOOD (ROUTINE X 2)  CULTURE, BLOOD (ROUTINE X 2)  URINE CULTURE  LIPASE, BLOOD  CBC  LACTIC ACID, PLASMA  TROPONIN I  LACTIC ACID, PLASMA    RADIOLOGY Images were viewed by me  CT the abdomen pelvis with IV contrast IMPRESSION:  1. Mucosal  edema remains at the site of diverticulitis of the  sigmoid colon previously described. However there are now 3  pericolonic abscesses associated with that site of rectosigmoid  colon diverticulitis.  2. There is a new focus of mild diverticulitis involving the  junction of the distal descending and proximal sigmoid colon without  complication.  3. Edema of the dome of the urinary bladder probably due to the  adjacent abscess. No fistulous communication is evident.  4. Nonobstructing renal calculi on the left.  5. Moderate abdominal aortic atherosclerosis.   CRITICAL CARE Performed by: Laurence Aly   Total critical care time: 30 minutes  Critical care time was exclusive of separately billable procedures and treating other patients.  Critical care was necessary to treat or prevent imminent or life-threatening deterioration.  Critical care was time spent personally by me on the following activities:  development of treatment plan with patient and/or surrogate as well as nursing, discussions with consultants, evaluation of patient's response to treatment, examination of patient, obtaining history from patient or surrogate, ordering and performing treatments and interventions, ordering and review of laboratory studies, ordering and review of radiographic studies, pulse oximetry and re-evaluation of patient's condition.  ____________________________________________  DIFFERENTIAL DIAGNOSIS   Sepsis, UTI, diverticulitis, diverticular abscess, pyelonephritis, dehydration, electrolyte abnormality, neutropenic fever  FINAL ASSESSMENT AND PLAN  Diverticular abscess, cystitis   Plan: The patient had presented for intermittent episodes of diverticulitis and abdominal abscess. Patient's labs were surprisingly good with the exception of possible UTI. Patient's imaging did reveal 3 pericolonic abscesses.  We had initiated sepsis protocol with her shaking chills on arrival and given IV vancomycin and Zosyn.  I will discuss with the hospitalist and surgery for admission.   Laurence Aly, MD   Note: This note was generated in part or whole with voice recognition software. Voice recognition is usually quite accurate but there are transcription errors that can and very often do occur. I apologize for any typographical errors that were not detected and corrected.     Earleen Newport, MD 10/11/17 1126

## 2017-10-11 NOTE — ED Notes (Signed)
First Nurse Note:  Patient to ED via [place] from [facility] with complaints of shivering and weakness.  Given a warm blanket.

## 2017-10-11 NOTE — Progress Notes (Signed)
Family Meeting Note  Advance Directive:yes  Today a meeting took place with the Patient and patient's daughter.  Patient is able to participate   The following clinical team members were present during this meeting:MD  The following were discussed:Patient's diagnosis: Diverticulitis with abscess/peritonitis, neutropenia, GERD, history of breast cancer, abdominal pain, Patient's progosis: Unable to determine and Goals for treatment: Full Code  Additional follow-up to be provided: prn  Time spent during discussion:20 minutes  Gorden Harms, MD

## 2017-10-11 NOTE — H&P (Signed)
[facility] [place] NAME: [name]    MR#:  [id number]  DATE OF BIRTH:  08-22-1934  DATE OF ADMISSION:  10/11/2017  PRIMARY CARE PHYSICIAN: Baxter Hire, MD   REQUESTING/REFERRING PHYSICIAN:   CHIEF COMPLAINT:   Chief Complaint  Patient presents with  . Abdominal Pain    HISTORY OF PRESENT ILLNESS: [name]  is a 83 y.o. female with a known history per below which also includes chronic neutropenia followed by oncology, history of diverticulitis-2 episodes of exacerbation within the last year, chronic temporal arteritis on chronic steroids for 17 years, presents from [facility] to the emergency room for Reiger's, generalized weakness, fatigue, left-sided abdominal pain, ill feeling since Friday last week, started on Cipro for UTI at the time, in the emergency room urinalysis noted for ketones/trace leukocyte esterase, CT abdomen noted for sigmoid diverticulitis with 3 new abscesses, ED attending discussed the case with the patient's general surgeon/Dr. Ferrel Logan who recommended hospitalist admission with surgical consultation, patient evaluated in the emergency room, daughter at the bedside, patient now be admitted for acute recurrent sigmoid diverticulitis with intraperitoneal abscess and peritonitis.  PAST MEDICAL HISTORY:   Past Medical History:  Diagnosis Date  . Breast cancer ([place])    1985  (bilateral mastectomy )  . Depression   . Diverticulosis   . GERD (gastroesophageal reflux disease)   . Hiatal hernia   . History of Bell's palsy   . History of colon polyps   . History of nephrolithiasis   . History of pancreatitis   . Hyperlipidemia   . IBS (irritable bowel syndrome)   . Osteoporosis   . Skin cancer   . Temporal arteritis ([place])   . Tubular adenoma of colon     PAST SURGICAL HISTORY:  Past Surgical History:  Procedure Laterality Date  . LAPAROSCOPIC CHOLECYSTECTOMY  2009  . MASTECTOMY  1983    bilateral    SOCIAL HISTORY:  Social History   Tobacco Use  . Smoking status: Former Smoker    Packs/day: 0.25    Years: 30.00    Pack years: 7.50    Types: Cigarettes    Last attempt to quit: 05/17/2005    Years since quitting: 12.4  . Smokeless tobacco: Never Used  . Tobacco comment: quit 11 years ago  Substance Use Topics  . Alcohol use: No    Alcohol/week: 0.0 oz    FAMILY HISTORY:  Family History  Problem Relation Age of Onset  . Colon cancer Mother 94  . Scleroderma Father 92  . Alcoholism Brother   . Stomach cancer Neg Hx     DRUG ALLERGIES:  Allergies  Allergen Reactions  . Benzalkonium Chloride Dermatitis    Other reaction(s): Unknown  . Escitalopram     Deveioped a twitch  . Neomycin-Bacitracin Zn-Polymyx Dermatitis    Other reaction(s): Other (See Comments) REACTION: blisters skin REACTION: blisters skin REACTION: blisters skin    REVIEW OF SYSTEMS:   CONSTITUTIONAL: No fever, +fatigue, weakness.  EYES: No blurred or double vision.  EARS, NOSE, AND THROAT: No tinnitus or ear pain.  RESPIRATORY: No cough, shortness of breath, wheezing or hemoptysis.  CARDIOVASCULAR: No chest pain, orthopnea, edema.  GASTROINTESTINAL: + nausea,no vomiting, diarrhea, + abdominal pain.  GENITOURINARY: No dysuria, hematuria.  ENDOCRINE: No polyuria, nocturia,  HEMATOLOGY: No anemia, easy bruising or bleeding SKIN: No rash or lesion. MUSCULOSKELETAL: No joint pain or arthritis.   NEUROLOGIC: No tingling, numbness, weakness.  PSYCHIATRY:  No anxiety or depression.   MEDICATIONS AT HOME:  Prior to Admission medications   Medication Sig Start Date End Date Taking? Authorizing Provider  ALPRAZolam (XANAX) 0.25 MG tablet Take 0.25 mg by mouth 2 (two) times daily as needed.  11/03/10  Yes [provider]  aspirin EC 81 MG tablet Take 81 mg daily by mouth.   Yes [provider]  Biotin 1000 MCG tablet Take 1,000 mcg by mouth daily.    Yes [provider]  Cholecalciferol (VITAMIN D3) 2000 units capsule Take 2,000 Units daily by mouth.    Yes [provider]  Cyanocobalamin (B-12 PO) Take 2,500 mcg daily by mouth.    Yes [provider]  estradiol (ESTRACE) 1 MG tablet Take 1 mg daily by mouth.  10/11/16  Yes [provider]  feeding supplement, ENSURE ENLIVE, (ENSURE ENLIVE) LIQD Take 237 mLs by mouth 2 (two) times daily between meals. 06/17/17  Yes Fritzi Mandes, MD  medroxyPROGESTERone (PROVERA) 2.5 MG tablet Take 2.5 mg by mouth daily.  12/11/16  Yes [provider]  predniSONE (DELTASONE) 5 MG tablet Take 2.5 mg by mouth daily.    Yes [provider]  fluticasone (FLONASE) 50 MCG/ACT nasal spray Place 1 spray into the nose daily.  12/24/16   [provider]  traMADol (ULTRAM) 50 MG tablet Take 50 mg by mouth 2 (two) times daily as needed for moderate pain.  11/21/10   [provider]      PHYSICAL EXAMINATION:   VITAL SIGNS: Blood pressure (!) 131/50, pulse (!) 101, temperature 98.4 F (36.9 C), temperature source Oral, resp. rate 17, height 5\' 5"  (1.651 m), weight 67.6 kg (149 lb), SpO2 97 %.  GENERAL:  83 y.o.-year-old patient lying in the bed with no acute distress.  Frail appearing, toxic appearing EYES: Pupils equal, round, reactive to light and accommodation. No scleral icterus. Extraocular muscles intact.  HEENT: Head atraumatic, normocephalic. Oropharynx and nasopharynx clear.  NECK:  Supple, no jugular venous distention. No thyroid enlargement, no tenderness.  LUNGS: Normal breath sounds bilaterally, no wheezing, rales,rhonchi or crepitation. No use of accessory muscles of respiration.  CARDIOVASCULAR: S1, S2 normal. No murmurs, rubs, or gallops.  ABDOMEN: Left-sided peritonitis with rebound, soft, nondistended. Bowel sounds present. No organomegaly or mass.  EXTREMITIES: No pedal edema, cyanosis, or clubbing.  NEUROLOGIC: Cranial nerves II through XII are  intact. MAES. Gait not checked.  PSYCHIATRIC: The patient is alert and oriented x 3.  SKIN: No obvious rash, lesion, or ulcer.   LABORATORY PANEL:   CBC Recent Labs  Lab 10/08/17 0921 10/10/17 1017 10/11/17 0915  WBC 3.6 5.7 3.7  HGB 13.1 12.6 12.9  HCT 39.1 38.1 37.6  PLT 310 305 309  MCV 91.8 91.7 90.4  MCH 30.6 30.4 31.0  MCHC 33.4 33.2 34.3  RDW 13.8 13.9 13.8  LYMPHSABS 0.9* 0.5*  --   MONOABS 0.2 0.2  --   EOSABS 0.0 0.0  --   BASOSABS 0.0 0.0  --    ------------------------------------------------------------------------------------------------------------------  Chemistries  Recent Labs  Lab 10/11/17 0915  NA 137  K 3.7  CL 103  CO2 24  GLUCOSE 100*  BUN 10  CREATININE 0.93  CALCIUM 8.7*  AST 24  ALT 11*  ALKPHOS 90  BILITOT 1.1   ------------------------------------------------------------------------------------------------------------------ estimated creatinine clearance is 42.7 mL/min (by C-G formula based on SCr of 0.93 mg/dL). ------------------------------------------------------------------------------------------------------------------ No results for input(s): TSH, T4TOTAL, T3FREE, THYROIDAB in the last 72 hours.  Invalid input(s): FREET3   Coagulation profile No results for input(s): INR, PROTIME in the last 168 hours. ------------------------------------------------------------------------------------------------------------------- No results for input(s): DDIMER in the last 72 hours. -------------------------------------------------------------------------------------------------------------------  Cardiac Enzymes Recent Labs  Lab 10/11/17 0949  TROPONINI <0.03   ------------------------------------------------------------------------------------------------------------------ Invalid input(s): POCBNP  ---------------------------------------------------------------------------------------------------------------  Urinalysis     Component Value Date/Time   COLORURINE YELLOW (A) 10/11/2017 0949   APPEARANCEUR CLOUDY (A) 10/11/2017 0949   APPEARANCEUR Clear 11/04/2014 0845   LABSPEC 1.012 10/11/2017 0949   LABSPEC 1.025 08/11/2012 1358   PHURINE 6.0 10/11/2017 0949   GLUCOSEU NEGATIVE 10/11/2017 0949   GLUCOSEU Negative 08/11/2012 1358   HGBUR MODERATE (A) 10/11/2017 0949   BILIRUBINUR NEGATIVE 10/11/2017 0949   BILIRUBINUR Negative 11/04/2014 0845   BILIRUBINUR Negative 08/11/2012 1358   KETONESUR 20 (A) 10/11/2017 0949   PROTEINUR 100 (A) 10/11/2017 0949   UROBILINOGEN 1.0 08/30/2007 2131   NITRITE NEGATIVE 10/11/2017 0949   LEUKOCYTESUR TRACE (A) 10/11/2017 0949   LEUKOCYTESUR 2+ (A) 11/04/2014 0845   LEUKOCYTESUR Negative 08/11/2012 1358     RADIOLOGY: Ct Abdomen Pelvis W Contrast  Result Date: 10/11/2017 CLINICAL DATA:  Abdominal pain, leukopenia of uncertain etiology EXAM: CT ABDOMEN AND PELVIS WITH CONTRAST TECHNIQUE: Multidetector CT imaging of the abdomen and pelvis was performed using the standard protocol following bolus administration of intravenous contrast. CONTRAST:  38mL ISOVUE-370 IOPAMIDOL (ISOVUE-370) INJECTION 76% COMPARISON:  CT abdomen and pelvis of 07/30/2017 and CT abdomen and pelvis of 05/24/2017 FINDINGS: Lower chest: The lung bases remain clear. The heart is within normal limits in size. There is a small hiatal hernia again noted. Hepatobiliary: There is a low-attenuation anteriorly in the left lobe of liver on image 5 series 2 most likely representing a small cyst but difficult to evaluate on the present study. This does appear to have been present on the prior CT from 05/24/2017 and has not changed. Surgical clips are noted from prior cholecystectomy. Pancreas: The pancreas is normal in size and the pancreatic duct is not dilated. Spleen: The spleen is unremarkable with a small cyst again noted posteriorly and inferiorly. Adrenals/Urinary Tract: The adrenal glands appear normal. The  kidneys enhance and there are at least 2 nonobstructing left renal calculi present. No definite right renal calculi are seen. The larger left renal calculus is within the upper pole measuring 5 mm in diameter. On delayed images, the pelvocaliceal systems are unremarkable. The ureters appear normal in caliber. The urinary bladder is not well distended but no abnormality is evident. The larger of the pericolonic abscess is lies on the dome of the urinary bladder which appears somewhat thickened. No fistulous communication is seen and no air is noted within the urinary bladder. The urinary bladder wall thickening most likely is due to edema from the adjacent inflammatory process. Stomach/Bowel: The stomach is not well distended. No small bowel abnormality is seen. There is persistent mucosal thickening of the mid sigmoid colon where diverticulitis has recently been described. However there are now 3 low-attenuation areas which appear pericolonic most consistent with small abscesses. The larger is seen on image 62 series 2 measuring 3.4 x 2.5 cm. This collection contains air and fluid. A second collection is noted involving the sigmoid colon of 2.2 x 0.8 cm on image 57, with a third smaller collection of 11 mm more anteriorly on image 58. These are most consistent with pericolonic abscesses. Multiple rectosigmoid colon diverticula are again noted. In addition, there is now some pericolonic strandiness at the junction of  the distal descending and proximal sigmoid colon which was not present previously. This most likely represents mild acute diverticulitis without complicating features at that site. The right colon is unremarkable. The appendix and terminal ileum appear normal. Vascular/Lymphatic: The abdominal aorta is normal in caliber with moderate abdominal aortic atherosclerosis present. No adenopathy is seen. Reproductive: The uterus is normal in size. No adnexal lesion is seen. No free fluid is seen within the  pelvis. Other: None. Musculoskeletal: There is mild 5 mm anterolisthesis of L4 on L5 which appears to be due to degenerative change of the facet joints. The SI joints appear well corticated. IMPRESSION: 1. Mucosal edema remains at the site of diverticulitis of the sigmoid colon previously described. However there are now 3 pericolonic abscesses associated with that site of rectosigmoid colon diverticulitis. 2. There is a new focus of mild diverticulitis involving the junction of the distal descending and proximal sigmoid colon without complication. 3. Edema of the dome of the urinary bladder probably due to the adjacent abscess. No fistulous communication is evident. 4. Nonobstructing renal calculi on the left. 5. Moderate abdominal aortic atherosclerosis. Electronically Signed   By: Ivar Drape M.D.   On: 10/11/2017 11:21    EKG: Orders placed or performed during the hospital encounter of 10/11/17  . ED EKG 12-Lead  . ED EKG 12-Lead    IMPRESSION AND PLAN: *Acute recurrent sigmoid diverticulitis with multiple associated abscesses, peritonitis Admit to regular nursing floor bed, empiric Zosyn IV, IV fluids for rehydration, general surgery consulted for expert opinion, adult pain protocol, follow-up on cultures, continue close medical monitoring  *Acute urinary tract infection Exacerbated by above Empiric Zosyn as stated above, follow-up on cultures  *Chronic neutropenia Oncology consulted for expert opinion White count is normal and may be due to ongoing infection  *Acute possible adrenal insufficiency  patient on 17 years of chronic steroids for temporal arteritis Hold prednisone, start hydrocortisone every 6 hours for now  *Chronic GERD without esophagitis Stable PPI daily  *Acute dehydration IV fluids for rehydration   All the records are reviewed and case discussed with ED provider. Management plans discussed with the patient, family and they are in agreement.  CODE  STATUS:full Code Status History    Date Active Date Inactive Code Status Order ID Comments User Context   08/02/2017 1750 08/04/2017 1825 Full Code [id number]  Vaughan Basta, MD Inpatient   06/14/2017 1937 06/17/2017 1812 Full Code [id number]  Dustin Flock, MD ED       TOTAL TIME TAKING CARE OF THIS PATIENT: 45 minutes.    Avel Peace Salary M.D on 10/11/2017   Between 7am to 6pm - Pager - [phone]  After 6pm go to [email or web address] - password [email or web address] [facility]  Office  [phone]  CC: Primary care physician; Baxter Hire, MD   Note: This dictation was prepared with Dragon dictation along with smaller phrase technology. Any transcriptional errors that result from this process are unintentional.

## 2017-10-11 NOTE — H&P (View-Only) (Signed)
SURGICAL CONSULTATION NOTE   HISTORY OF PRESENT ILLNESS (HPI):  83 y.o. female presented to [facility] ED for evaluation of complicated diverticulits. Patient reports has been feeling bad with abdominal pain and weak since 7 days ago. The patient refers that she thought it was an urinary tract infection and started with Cipro at home. Even on antibiotic therapy patient has not been feeling better and ever weaker the past 4 days being on bed all the time. Pain has localized to the left lower quadrant and radiates sometimes to the suprapubic area. Pain has not improved with anything. Pain is aggravated with movement. Patient has not been able to eat much in the past few day only some soft diet.  Denies denies fever, refers chills.   Patient has history of multiple diverticulitis episodes treated with antibiotic therapy. Recently evaluated by myself at the clinic to discuss surgery alternatives. Due to multiple comorbidity, chronic steroid use and significant leukopenia of unknown etiology, surgery has been on hold until patient was optimized of his medical condition and improved her WBC count. Now patient with another episode of complicated diverticulitis.   Patient seen today at ER were CT scan was done. I personally reviewed the images seeing small intraluminal abscess which will not benefit of percutaneous drainage since are small and seem to be intraluminal.   Surgery is consulted by Dr. Jimmye Norman in this context for evaluation and management of complicated diverticulitis.  PAST MEDICAL HISTORY (PMH):  Past Medical History:  Diagnosis Date  . Breast cancer ([place])    1985  (bilateral mastectomy )  . Depression   . Diverticulosis   . GERD (gastroesophageal reflux disease)   . Hiatal hernia   . History of Bell's palsy   . History of colon polyps   . History of nephrolithiasis   . History of pancreatitis   . Hyperlipidemia   . IBS (irritable bowel syndrome)   . Osteoporosis   . Skin cancer   .  Temporal arteritis ([place])   . Tubular adenoma of colon      PAST SURGICAL HISTORY ([place]):  Past Surgical History:  Procedure Laterality Date  . LAPAROSCOPIC CHOLECYSTECTOMY  2009  . MASTECTOMY  1983   bilateral     MEDICATIONS:  Prior to Admission medications   Medication Sig Start Date End Date Taking? Authorizing Provider  ALPRAZolam (XANAX) 0.25 MG tablet Take 0.25 mg by mouth 2 (two) times daily as needed.  11/03/10  Yes [provider]  aspirin EC 81 MG tablet Take 81 mg daily by mouth.   Yes [provider]  Biotin 1000 MCG tablet Take 1,000 mcg by mouth daily.    Yes [provider]  Cholecalciferol (VITAMIN D3) 2000 units capsule Take 2,000 Units daily by mouth.    Yes [provider]  Cyanocobalamin (B-12 PO) Take 2,500 mcg daily by mouth.    Yes [provider]  estradiol (ESTRACE) 1 MG tablet Take 1 mg daily by mouth.  10/11/16  Yes [provider]  feeding supplement, ENSURE ENLIVE, (ENSURE ENLIVE) LIQD Take 237 mLs by mouth 2 (two) times daily between meals. 06/17/17  Yes Fritzi Mandes, MD  medroxyPROGESTERone (PROVERA) 2.5 MG tablet Take 2.5 mg by mouth daily.  12/11/16  Yes [provider]  predniSONE (DELTASONE) 5 MG tablet Take 2.5 mg by mouth daily.    Yes [provider]  fluticasone (FLONASE) 50 MCG/ACT nasal spray Place 1 spray into the nose daily.  12/24/16   [provider]  traMADol (ULTRAM) 50 MG tablet Take 50 mg by mouth 2 (two) times daily as needed for moderate pain.  11/21/10   [provider]     ALLERGIES:  Allergies  Allergen Reactions  . Benzalkonium Chloride Dermatitis    Other reaction(s): Unknown  . Escitalopram     Deveioped a twitch  . Neomycin-Bacitracin Zn-Polymyx Dermatitis    Other reaction(s): Other (See Comments) REACTION: blisters skin REACTION: blisters skin REACTION: blisters skin     SOCIAL HISTORY:  Social History   Socioeconomic History  .  Marital status: Widowed    Spouse name: Not on file  . Number of children: 2  . Years of education: college  . Highest education level: Not on file  Occupational History  . 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 Financial resource strain: Not on file  . Food insecurity:    Worry: Not on file    Inability: Not on file  . Transportation needs:    Medical: Not on file    Non-medical: Not on file  Tobacco Use  . Smoking status: Former Smoker    Packs/day: 0.25    Years: 30.00    Pack years: 7.50    Types: Cigarettes    Last attempt to quit: 05/17/2005    Years since quitting: 12.4  . Smokeless tobacco: Never Used  . Tobacco comment: quit 11 years ago  Substance and Sexual Activity  . Alcohol use: No    Alcohol/week: 0.0 oz  . Drug use: No  . Sexual activity: Not Currently  Lifestyle  . Physical activity:    Days per week: Not on file    Minutes per session: Not on file  . Stress: Not on file  Relationships  . Social connections:    Talks on phone: Not on file    Gets together: Not on file    Attends religious service: Not on file    Active member of club or organization: Not on file    Attends meetings of clubs or organizations: Not on file    Relationship status: Not on file  . Intimate partner violence:    Fear of current or ex partner: Not on file    Emotionally abused: Not on file    Physically abused: Not on file    Forced sexual activity: Not on file  Other Topics Concern  . Not on file  Social History Narrative   Patient lives at home alone.    Patient is retired.    Patient has some college.    Patient has 2 children.    The patient currently resides (home / rehab facility / nursing home): Home The patient normally is (ambulatory / bedbound): Ambulatory   FAMILY HISTORY:  Family History  Problem Relation Age of Onset  . Colon cancer Mother 22  . Scleroderma Father 80  . Alcoholism Brother   . Stomach cancer Neg Hx       REVIEW OF SYSTEMS:  Constitutional: Positive forweight loss, chills, or sweats. Denies fever Eyes: denies any other vision changes, history of eye injury  ENT: denies sore throat, hearing problems  Respiratory: denies shortness of breath, wheezing  Cardiovascular: denies chest pain, palpitations  Gastrointestinal: positive for abdominal pain, negative for N/V, (see HPI) Genitourinary: positive for burning with urination or urinary frequency Musculoskeletal: denies any other joint pains or cramps  Skin: denies any other rashes or skin discolorations  Neurological: denies any other headache, dizziness, weakness  Psychiatric: denies any other depression, anxiety   All other review of systems were negative   VITAL SIGNS:  Temp:  [98.4 F (36.9 C)-99.5 F (37.5 C)] 99.5 F (37.5 C) (05/16 1310) Pulse Rate:  [96-109] 98 (05/16 1310) Resp:  [16-24] 17 (05/16 1310) BP: (116-156)/(46-59) 116/46 (05/16 1310) SpO2:  [95 %-100 %] 100 % (05/16 1310) Weight:  [67.6 kg (149 lb)] 67.6 kg (149 lb) (05/16 0912)     Height: 5\' 5"  (165.1 cm) Weight: 67.6 kg (149 lb) BMI (Calculated): 24.79   PHYSICAL EXAM:  Constitutional:  -- Normal body habitus  -- Awake, alert, and oriented x3, generalized weakness  Eyes:  -- Pupils equally round and reactive to light  -- No scleral icterus  Ear, nose, and throat:  -- No jugular venous distension  Pulmonary:  -- No crackles  -- Equal breath sounds bilaterally -- Breathing non-labored at rest Cardiovascular:  -- S1, S2 present  -- No pericardial rubs Gastrointestinal:  -- Abdomen soft, significant tender to palpation on left lower quadrant, non-distended, no guarding. Positive for rebound tenderness on left lower quadrant -- No abdominal masses appreciated, pulsatile or otherwise  Musculoskeletal and Integumentary:  -- Wounds or skin discoloration: None appreciated -- Extremities:B/L UE and LE FROM, hands and feet warm,no edema  Neurologic:  --  Motor function: intact and symmetric. Strength 4/5 in all extremities -- Sensation: intact and symmetric   Labs:  CBC Latest Ref Rng & Units 10/11/2017 10/10/2017 10/08/2017  WBC 3.6 - 11.0 K/uL 3.7 5.7 3.6  Hemoglobin 12.0 - 16.0 g/dL 12.9 12.6 13.1  Hematocrit 35.0 - 47.0 % 37.6 38.1 39.1  Platelets 150 - 440 K/uL 309 305 310   CMP Latest Ref Rng & Units 10/11/2017 09/21/2017 08/20/2017  Glucose 65 - 99 mg/dL 100(H) 92 103(H)  BUN 6 - 20 mg/dL 10 8 10   Creatinine 0.44 - 1.00 mg/dL 0.93 0.95 0.87  Sodium 135 - 145 mmol/L 137 139 136  Potassium 3.5 - 5.1 mmol/L 3.7 3.7 3.8  Chloride 101 - 111 mmol/L 103 106 100(L)  CO2 22 - 32 mmol/L 24 28 25   Calcium 8.9 - 10.3 mg/dL 8.7(L) 8.5(L) 8.8(L)  Total Protein 6.5 - 8.1 g/dL 7.5 6.6 7.7  Total Bilirubin 0.3 - 1.2 mg/dL 1.1 0.9 0.8  Alkaline Phos 38 - 126 U/L 90 111 68  AST 15 - 41 U/L 24 22 26   ALT 14 - 54 U/L 11(L) 12(L) 12(L)   Imaging studies:  Abdominal CT scan reviewed and found with sigmoid diverticulitis with multiple pericolonic small abscesses. No free air, no free fluid. No bowel distention.   Assessment/Plan:  83 y.o. female with complicated diverticulitis, complicated by pertinent comorbidities including urinary tract infection, chronic neutropenia, chronic use of oral systemic steroid, suspected adrenal insufficiency. Patient with significant pain started on Zosyn. Agree with current treatment. A thorough discussion with patient and daughter was done and after orienting patient about the significant risk of surgery due to the mentioned comorbidities, patient refers that she cannot live like that anymore due to the frequent episodes of pain, weakness, deliriums. I oriented the patient that in this acute process the only alternative that I can offer is a sigmoidectomy with end colostomy. Patient oriented that any complication from surgery, bowel perforation, obstruction, adjacent organ injuries, pneumonia, DVT,cardiac complications, may  deteriorate patient condition. I consider taht if patient is optimized, the benefits of the surgery may outweigh the already high risk patient. Patient and her daughter refers that will  think about it. Also patient need to be optimized which I consider that with current management will be possible. I will discuss the case with Hospitalist, for clearance evaluation. Will discuss again tomorrow with patient and daughter surgical alternatives, benefits and risks.   Agree with nothing by mouth status, patient may chew ice chips. When pain improve may consider starting clear liquid diet.   All of the above findings and recommendations were discussed with the patient and her daughter, and all questions were answered to their expressed satisfaction.  Arnold Long, MD

## 2017-10-11 NOTE — Consult Note (Signed)
SURGICAL CONSULTATION NOTE   HISTORY OF PRESENT ILLNESS (HPI):  83 y.o. female presented to [facility] ED for evaluation of complicated diverticulits. Patient reports has been feeling bad with abdominal pain and weak since 7 days ago. The patient refers that she thought it was an urinary tract infection and started with Cipro at home. Even on antibiotic therapy patient has not been feeling better and ever weaker the past 4 days being on bed all the time. Pain has localized to the left lower quadrant and radiates sometimes to the suprapubic area. Pain has not improved with anything. Pain is aggravated with movement. Patient has not been able to eat much in the past few day only some soft diet.  Denies denies fever, refers chills.   Patient has history of multiple diverticulitis episodes treated with antibiotic therapy. Recently evaluated by myself at the clinic to discuss surgery alternatives. Due to multiple comorbidity, chronic steroid use and significant leukopenia of unknown etiology, surgery has been on hold until patient was optimized of his medical condition and improved her WBC count. Now patient with another episode of complicated diverticulitis.   Patient seen today at ER were CT scan was done. I personally reviewed the images seeing small intraluminal abscess which will not benefit of percutaneous drainage since are small and seem to be intraluminal.   Surgery is consulted by Dr. Jimmye Norman in this context for evaluation and management of complicated diverticulitis.  PAST MEDICAL HISTORY (PMH):  Past Medical History:  Diagnosis Date  . Breast cancer ([place])    1985  (bilateral mastectomy )  . Depression   . Diverticulosis   . GERD (gastroesophageal reflux disease)   . Hiatal hernia   . History of Bell's palsy   . History of colon polyps   . History of nephrolithiasis   . History of pancreatitis   . Hyperlipidemia   . IBS (irritable bowel syndrome)   . Osteoporosis   . Skin cancer   .  Temporal arteritis ([place])   . Tubular adenoma of colon      PAST SURGICAL HISTORY ([place]):  Past Surgical History:  Procedure Laterality Date  . LAPAROSCOPIC CHOLECYSTECTOMY  2009  . MASTECTOMY  1983   bilateral     MEDICATIONS:  Prior to Admission medications   Medication Sig Start Date End Date Taking? Authorizing Provider  ALPRAZolam (XANAX) 0.25 MG tablet Take 0.25 mg by mouth 2 (two) times daily as needed.  11/03/10  Yes [provider]  aspirin EC 81 MG tablet Take 81 mg daily by mouth.   Yes [provider]  Biotin 1000 MCG tablet Take 1,000 mcg by mouth daily.    Yes [provider]  Cholecalciferol (VITAMIN D3) 2000 units capsule Take 2,000 Units daily by mouth.    Yes [provider]  Cyanocobalamin (B-12 PO) Take 2,500 mcg daily by mouth.    Yes [provider]  estradiol (ESTRACE) 1 MG tablet Take 1 mg daily by mouth.  10/11/16  Yes [provider]  feeding supplement, ENSURE ENLIVE, (ENSURE ENLIVE) LIQD Take 237 mLs by mouth 2 (two) times daily between meals. 06/17/17  Yes Fritzi Mandes, MD  medroxyPROGESTERone (PROVERA) 2.5 MG tablet Take 2.5 mg by mouth daily.  12/11/16  Yes [provider]  predniSONE (DELTASONE) 5 MG tablet Take 2.5 mg by mouth daily.    Yes [provider]  fluticasone (FLONASE) 50 MCG/ACT nasal spray Place 1 spray into the nose daily.  12/24/16   [provider]  traMADol (ULTRAM) 50 MG tablet Take 50 mg by mouth 2 (two) times daily as needed for moderate pain.  11/21/10   [provider]     ALLERGIES:  Allergies  Allergen Reactions  . Benzalkonium Chloride Dermatitis    Other reaction(s): Unknown  . Escitalopram     Deveioped a twitch  . Neomycin-Bacitracin Zn-Polymyx Dermatitis    Other reaction(s): Other (See Comments) REACTION: blisters skin REACTION: blisters skin REACTION: blisters skin     SOCIAL HISTORY:  Social History   Socioeconomic History  .  Marital status: Widowed    Spouse name: Not on file  . Number of children: 2  . Years of education: college  . Highest education level: Not on file  Occupational History  . 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 Financial resource strain: Not on file  . Food insecurity:    Worry: Not on file    Inability: Not on file  . Transportation needs:    Medical: Not on file    Non-medical: Not on file  Tobacco Use  . Smoking status: Former Smoker    Packs/day: 0.25    Years: 30.00    Pack years: 7.50    Types: Cigarettes    Last attempt to quit: 05/17/2005    Years since quitting: 12.4  . Smokeless tobacco: Never Used  . Tobacco comment: quit 11 years ago  Substance and Sexual Activity  . Alcohol use: No    Alcohol/week: 0.0 oz  . Drug use: No  . Sexual activity: Not Currently  Lifestyle  . Physical activity:    Days per week: Not on file    Minutes per session: Not on file  . Stress: Not on file  Relationships  . Social connections:    Talks on phone: Not on file    Gets together: Not on file    Attends religious service: Not on file    Active member of club or organization: Not on file    Attends meetings of clubs or organizations: Not on file    Relationship status: Not on file  . Intimate partner violence:    Fear of current or ex partner: Not on file    Emotionally abused: Not on file    Physically abused: Not on file    Forced sexual activity: Not on file  Other Topics Concern  . Not on file  Social History Narrative   Patient lives at home alone.    Patient is retired.    Patient has some college.    Patient has 2 children.    The patient currently resides (home / rehab facility / nursing home): Home The patient normally is (ambulatory / bedbound): Ambulatory   FAMILY HISTORY:  Family History  Problem Relation Age of Onset  . Colon cancer Mother 47  . Scleroderma Father 45  . Alcoholism Brother   . Stomach cancer Neg Hx       REVIEW OF SYSTEMS:  Constitutional: Positive forweight loss, chills, or sweats. Denies fever Eyes: denies any other vision changes, history of eye injury  ENT: denies sore throat, hearing problems  Respiratory: denies shortness of breath, wheezing  Cardiovascular: denies chest pain, palpitations  Gastrointestinal: positive for abdominal pain, negative for N/V, (see HPI) Genitourinary: positive for burning with urination or urinary frequency Musculoskeletal: denies any other joint pains or cramps  Skin: denies any other rashes or skin discolorations  Neurological: denies any other headache, dizziness, weakness  Psychiatric: denies any other depression, anxiety   All other review of systems were negative   VITAL SIGNS:  Temp:  [98.4 F (36.9 C)-99.5 F (37.5 C)] 99.5 F (37.5 C) (05/16 1310) Pulse Rate:  [96-109] 98 (05/16 1310) Resp:  [16-24] 17 (05/16 1310) BP: (116-156)/(46-59) 116/46 (05/16 1310) SpO2:  [95 %-100 %] 100 % (05/16 1310) Weight:  [67.6 kg (149 lb)] 67.6 kg (149 lb) (05/16 0912)     Height: 5\' 5"  (165.1 cm) Weight: 67.6 kg (149 lb) BMI (Calculated): 24.79   PHYSICAL EXAM:  Constitutional:  -- Normal body habitus  -- Awake, alert, and oriented x3, generalized weakness  Eyes:  -- Pupils equally round and reactive to light  -- No scleral icterus  Ear, nose, and throat:  -- No jugular venous distension  Pulmonary:  -- No crackles  -- Equal breath sounds bilaterally -- Breathing non-labored at rest Cardiovascular:  -- S1, S2 present  -- No pericardial rubs Gastrointestinal:  -- Abdomen soft, significant tender to palpation on left lower quadrant, non-distended, no guarding. Positive for rebound tenderness on left lower quadrant -- No abdominal masses appreciated, pulsatile or otherwise  Musculoskeletal and Integumentary:  -- Wounds or skin discoloration: None appreciated -- Extremities:B/L UE and LE FROM, hands and feet warm,no edema  Neurologic:  --  Motor function: intact and symmetric. Strength 4/5 in all extremities -- Sensation: intact and symmetric   Labs:  CBC Latest Ref Rng & Units 10/11/2017 10/10/2017 10/08/2017  WBC 3.6 - 11.0 K/uL 3.7 5.7 3.6  Hemoglobin 12.0 - 16.0 g/dL 12.9 12.6 13.1  Hematocrit 35.0 - 47.0 % 37.6 38.1 39.1  Platelets 150 - 440 K/uL 309 305 310   CMP Latest Ref Rng & Units 10/11/2017 09/21/2017 08/20/2017  Glucose 65 - 99 mg/dL 100(H) 92 103(H)  BUN 6 - 20 mg/dL 10 8 10   Creatinine 0.44 - 1.00 mg/dL 0.93 0.95 0.87  Sodium 135 - 145 mmol/L 137 139 136  Potassium 3.5 - 5.1 mmol/L 3.7 3.7 3.8  Chloride 101 - 111 mmol/L 103 106 100(L)  CO2 22 - 32 mmol/L 24 28 25   Calcium 8.9 - 10.3 mg/dL 8.7(L) 8.5(L) 8.8(L)  Total Protein 6.5 - 8.1 g/dL 7.5 6.6 7.7  Total Bilirubin 0.3 - 1.2 mg/dL 1.1 0.9 0.8  Alkaline Phos 38 - 126 U/L 90 111 68  AST 15 - 41 U/L 24 22 26   ALT 14 - 54 U/L 11(L) 12(L) 12(L)   Imaging studies:  Abdominal CT scan reviewed and found with sigmoid diverticulitis with multiple pericolonic small abscesses. No free air, no free fluid. No bowel distention.   Assessment/Plan:  83 y.o. female with complicated diverticulitis, complicated by pertinent comorbidities including urinary tract infection, chronic neutropenia, chronic use of oral systemic steroid, suspected adrenal insufficiency. Patient with significant pain started on Zosyn. Agree with current treatment. A thorough discussion with patient and daughter was done and after orienting patient about the significant risk of surgery due to the mentioned comorbidities, patient refers that she cannot live like that anymore due to the frequent episodes of pain, weakness, deliriums. I oriented the patient that in this acute process the only alternative that I can offer is a sigmoidectomy with end colostomy. Patient oriented that any complication from surgery, bowel perforation, obstruction, adjacent organ injuries, pneumonia, DVT,cardiac complications, may  deteriorate patient condition. I consider taht if patient is optimized, the benefits of the surgery may outweigh the already high risk patient. Patient and her daughter refers that will  think about it. Also patient need to be optimized which I consider that with current management will be possible. I will discuss the case with Hospitalist, for clearance evaluation. Will discuss again tomorrow with patient and daughter surgical alternatives, benefits and risks.   Agree with nothing by mouth status, patient may chew ice chips. When pain improve may consider starting clear liquid diet.   All of the above findings and recommendations were discussed with the patient and her daughter, and all questions were answered to their expressed satisfaction.  Arnold Long, MD

## 2017-10-12 ENCOUNTER — Inpatient Hospital Stay: Payer: Medicare Other

## 2017-10-12 LAB — BASIC METABOLIC PANEL
ANION GAP: 7 (ref 5–15)
BUN: 7 mg/dL (ref 6–20)
CALCIUM: 8.2 mg/dL — AB (ref 8.9–10.3)
CHLORIDE: 107 mmol/L (ref 101–111)
CO2: 24 mmol/L (ref 22–32)
Creatinine, Ser: 0.86 mg/dL (ref 0.44–1.00)
GFR calc non Af Amer: >60 mL/min (ref >60)
Glucose, Bld: 214 mg/dL — ABNORMAL HIGH (ref 65–99)
POTASSIUM: 3.7 mmol/L (ref 3.5–5.1)
Sodium: 138 mmol/L (ref 135–145)

## 2017-10-12 LAB — CBC
HCT: 37.7 % (ref 35.0–47.0)
HEMOGLOBIN: 12.5 g/dL (ref 12.0–16.0)
MCH: 29.8 pg (ref 26.0–34.0)
MCHC: 33.2 g/dL (ref 32.0–36.0)
MCV: 89.7 fL (ref 80.0–100.0)
Platelets: 273 10*3/uL (ref 150–440)
RBC: 4.21 MIL/uL (ref 3.80–5.20)
RDW: 13.4 % (ref 11.5–14.5)
WBC: 1.8 10*3/uL — ABNORMAL LOW (ref 3.6–11.0)

## 2017-10-12 LAB — URINE CULTURE: Culture: NO GROWTH

## 2017-10-12 LAB — SURGICAL PCR SCREEN
MRSA, PCR: NEGATIVE
Staphylococcus aureus: NEGATIVE

## 2017-10-12 MED ORDER — MUPIROCIN 2 % EX OINT
1.0000 "application " | TOPICAL_OINTMENT | Freq: Two times a day (BID) | CUTANEOUS | Status: DC
  Filled 2017-10-12: qty 22

## 2017-10-12 NOTE — Progress Notes (Signed)
Inpatient Diabetes Program Recommendations  AACE/ADA: New Consensus Statement on Inpatient Glycemic Control (2019)  Target Ranges:  Prepandial:   less than 140 mg/dL      Peak postprandial:   less than 180 mg/dL (1-2 hours)      Critically ill patients:  140 - 180 mg/dL   Results for BETTI, GOODENOW (MRN [id number]) as of 10/12/2017 10:17  Ref. Range 10/11/2017 09:15 10/12/2017 05:28  Glucose Latest Ref Range: 65 - 99 mg/dL 100 (H) 214 (H)   Review of Glycemic Control  Diabetes history: No Outpatient Diabetes medications: NA Current orders for Inpatient glycemic control: None  Inpatient Diabetes Program Recommendations: Correction ([facility]): While inpatient and ordered steroids, please consider ordering CBGs with Novolog correction scale ACHS.  Thanks, Barnie Alderman, RN, MSN, CDE Diabetes Coordinator Inpatient Diabetes Program [phone] (Team Pager from 8am to 5pm)

## 2017-10-12 NOTE — Progress Notes (Signed)
Patient is visited by her own clergy from [place].  Chaplain reminded the patient that chaplains are available 24/7.

## 2017-10-12 NOTE — Progress Notes (Signed)
Initial Nutrition Assessment  DOCUMENTATION CODES:   Not applicable  INTERVENTION:   RD will order supplements when diet advanced.   If surgery is postponed until next week and pt unable to be placed on a full liquid diet in the interim, recommend initiation of TPN.  If TPN initiated, would recommend Clinimix 5/15 with electrolytes at goal rate of 53m/hr and 20% lipids at 280mhr x 12 hrs  Recommend add MVI and trace elements daily in TPN  Regimen would provide 1588kcal/day, 78g/day protein, and 180061molume  NUTRITION DIAGNOSIS:   Inadequate oral intake related to acute illness as evidenced by NPO status.  GOAL:   Patient will meet greater than or equal to 90% of their needs  MONITOR:   Diet advancement, Labs, Weight trends, Skin, I & O's  REASON FOR ASSESSMENT:   Malnutrition Screening Tool    ASSESSMENT:   83 27o. female with complicated diverticulitis, complicated by pertinent comorbidities including urinary tract infection, chronic neutropenia, chronic use of oral systemic steroid, suspected adrenal insufficiency.   Met with pt in room today, pt reports intermittent nausea, vomiting, diarrhea, and poor oral intake since November 2018. Pt found to have diverticulitis in 04/2017. Pt states that ever since then she has been having intermittent pain and a change in her taste and appetite. Pt reports that at baseline, she is a good eater and loves food. Per chart, pt has lost 26lbs(14%) over the past 6 months; this is significant given the time frame. Pt reports that the last time she ate was Tuesday 5/14. Pt does like Ensure and has been drinking them intermittently since November. Spoke with MD, plan is for pt to have colostomy sometime in the next several days. If surgery is postponed until next week and pt unable to be placed on a full liquid diet in the interim, recommend initiation of TPN. It will be beneficial to maximize pt's nutrition prior to surgery as she is  already with compromised immune function, poor nutrition x 6 months, on steroids, and of advanced age. Pt likely at low to moderate refeeding risk as she is already receiving IV dextrose.   Medications reviewed and include: solu-corteff, NaCl with 5% dextrose with KCl '@100ml' /hr, pepcid, zosyn  Labs reviewed: K 3.7 wnl, Ca 8.2(L) Wbc- 1.8(L)  NUTRITION - FOCUSED PHYSICAL EXAM:    Most Recent Value  Orbital Region  No depletion  Upper Arm Region  No depletion  Thoracic and Lumbar Region  No depletion  Buccal Region  No depletion  Temple Region  No depletion  Clavicle Bone Region  No depletion  Clavicle and Acromion Bone Region  No depletion  Scapular Bone Region  No depletion  Dorsal Hand  No depletion  Patellar Region  No depletion  Anterior Thigh Region  No depletion  Posterior Calf Region  No depletion  Edema (RD Assessment)  None  Hair  Reviewed  Eyes  Reviewed  Mouth  Reviewed  Skin  Reviewed  Nails  Reviewed     Diet Order:   Diet Order           Diet NPO time specified Except for: Sips with Meds  Diet effective now         EDUCATION NEEDS:   Education needs have been addressed  Skin:  Skin Assessment: Reviewed RN Assessment(Ecchymosis )  Last BM:  5/15  Height:   Ht Readings from Last 1 Encounters:  10/11/17 '5\' 5"'  (1.651 m)    Weight:   Wt Readings  from Last 1 Encounters:  10/12/17 149 lb 4.8 oz (67.7 kg)    Ideal Body Weight:  56.8 kg  BMI:  Body mass index is 24.84 kg/m.  Estimated Nutritional Needs:   Kcal:  1400-1600kcal/day   Protein:  68-81g/day   Fluid:  >1.4L/day   Koleen Distance MS, RD, LDN Pager #- [phone] Office#- [phone] After Hours Pager: [phone]

## 2017-10-12 NOTE — Progress Notes (Signed)
Patient ID: [name], female   DOB: 1934/11/09, 83 y.o.   MRN: [id number]  Patient evaluated today and found with some improvement. Still with significant pain on left lower quadrant but is more active and more comfortable. Patient is alert and oriented x 3  Vitals:   10/12/17 1331 10/12/17 2017  BP: (!) 154/58 (!) 144/67  Pulse: 89 94  Resp: 18 18  Temp: 97.7 F (36.5 C) 98.3 F (36.8 C)  SpO2: 100% 98%   General: alert, oriented x 3, more active, afebrile, more comfortable than yesterday. Abdomen: soft, depressible, moderate tenderness with rebound tenderness localized to the left lower quadrant. Non distended.   Patient and her daughter were oriented about surgical alternatives. A long discussion was taken. Patient refers that she is miserable with the recurrent symptoms and the multiple admission to the hospital. Patient gets to the point of getting altered mental status and sepsis signs. During the long discussion, the patient was oriented that in the acute process the only alternative that I can offer is a sigmoidectomy with end colostomy. Patient oriented that his comorbidities of chronic prednisone, chronic leukopenia and her advance age make her a high risk patient if any complications develop. Patient and daughter were oriented about possible complication such as: infection, bleeding, abscess, ileus, small bowel obstruction, injury to adjacent organs such as small bowel, kidney, ureter, bladder, vascular structures, spleen, among others. Internal medicine doctor performed the pre operative evaluation and assess patient is optimized for the procedure. Will schedule surgery for tomorrow.

## 2017-10-12 NOTE — Progress Notes (Addendum)
[place] [facility] [place] NAME: [name]    MR#:  [id number]  DATE OF BIRTH:  1934-08-13  SUBJECTIVE:  patient came in with increasing left lower abdominal pain. Found to have diverticulitis with possible abscess. No nausea vomiting  REVIEW OF SYSTEMS:   Review of Systems  Constitutional: Negative for chills, fever and weight loss.  HENT: Negative for ear discharge, ear pain and nosebleeds.   Eyes: Negative for blurred vision, pain and discharge.  Respiratory: Negative for sputum production, shortness of breath, wheezing and stridor.   Cardiovascular: Negative for chest pain, palpitations, orthopnea and PND.  Gastrointestinal: Positive for abdominal pain. Negative for diarrhea, nausea and vomiting.  Genitourinary: Negative for frequency and urgency.  Musculoskeletal: Negative for back pain and joint pain.  Neurological: Negative for sensory change, speech change, focal weakness and weakness.  Psychiatric/Behavioral: Negative for depression and hallucinations. The patient is not nervous/anxious.    Tolerating Diet: clear liquid Tolerating PT: ambulatory  DRUG ALLERGIES:   Allergies  Allergen Reactions  . Benzalkonium Chloride Dermatitis    Other reaction(s): Unknown  . Escitalopram     Deveioped a twitch  . Neomycin-Bacitracin Zn-Polymyx Dermatitis    Other reaction(s): Other (See Comments) REACTION: blisters skin REACTION: blisters skin REACTION: blisters skin    VITALS:  Blood pressure (!) 154/58, pulse 89, temperature 97.7 F (36.5 C), temperature source Oral, resp. rate 18, height 5\' 5"  (1.651 m), weight 67.7 kg (149 lb 4.8 oz), SpO2 100 %.  PHYSICAL EXAMINATION:   Physical Exam  GENERAL:  83 y.o.-year-old patient lying in the bed with no acute distress.  EYES: Pupils equal, round, reactive to light and accommodation. No scleral icterus. Extraocular muscles intact.  HEENT: Head atraumatic, normocephalic.  Oropharynx and nasopharynx clear.  NECK:  Supple, no jugular venous distention. No thyroid enlargement, no tenderness.  LUNGS: Normal breath sounds bilaterally, no wheezing, rales, rhonchi. No use of accessory muscles of respiration.  CARDIOVASCULAR: S1, S2 normal. No murmurs, rubs, or gallops.  ABDOMEN: Soft, nontender, nondistended. Bowel sounds present. No organomegaly or mass.  EXTREMITIES: No cyanosis, clubbing or edema b/l.    NEUROLOGIC: Cranial nerves II through XII are intact. No focal Motor or sensory deficits b/l.   PSYCHIATRIC:  patient is alert and oriented x 3.  SKIN: No obvious rash, lesion, or ulcer.   LABORATORY PANEL:  CBC Recent Labs  Lab 10/12/17 0528  WBC 1.8*  HGB 12.5  HCT 37.7  PLT 273    Chemistries  Recent Labs  Lab 10/11/17 0915 10/12/17 0528  NA 137 138  K 3.7 3.7  CL 103 107  CO2 24 24  GLUCOSE 100* 214*  BUN 10 7  CREATININE 0.93 0.86  CALCIUM 8.7* 8.2*  AST 24  --   ALT 11*  --   ALKPHOS 90  --   BILITOT 1.1  --    Cardiac Enzymes Recent Labs  Lab 10/11/17 0949  TROPONINI <0.03   RADIOLOGY:  Ct Abdomen Pelvis W Contrast  Result Date: 10/11/2017 CLINICAL DATA:  Abdominal pain, leukopenia of uncertain etiology EXAM: CT ABDOMEN AND PELVIS WITH CONTRAST TECHNIQUE: Multidetector CT imaging of the abdomen and pelvis was performed using the standard protocol following bolus administration of intravenous contrast. CONTRAST:  41mL ISOVUE-370 IOPAMIDOL (ISOVUE-370) INJECTION 76% COMPARISON:  CT abdomen and pelvis of 07/30/2017 and CT abdomen and pelvis of 05/24/2017 FINDINGS: Lower chest: The lung bases remain clear. The heart is within normal limits in size.  There is a small hiatal hernia again noted. Hepatobiliary: There is a low-attenuation anteriorly in the left lobe of liver on image 5 series 2 most likely representing a small cyst but difficult to evaluate on the present study. This does appear to have been present on the prior CT from  05/24/2017 and has not changed. Surgical clips are noted from prior cholecystectomy. Pancreas: The pancreas is normal in size and the pancreatic duct is not dilated. Spleen: The spleen is unremarkable with a small cyst again noted posteriorly and inferiorly. Adrenals/Urinary Tract: The adrenal glands appear normal. The kidneys enhance and there are at least 2 nonobstructing left renal calculi present. No definite right renal calculi are seen. The larger left renal calculus is within the upper pole measuring 5 mm in diameter. On delayed images, the pelvocaliceal systems are unremarkable. The ureters appear normal in caliber. The urinary bladder is not well distended but no abnormality is evident. The larger of the pericolonic abscess is lies on the dome of the urinary bladder which appears somewhat thickened. No fistulous communication is seen and no air is noted within the urinary bladder. The urinary bladder wall thickening most likely is due to edema from the adjacent inflammatory process. Stomach/Bowel: The stomach is not well distended. No small bowel abnormality is seen. There is persistent mucosal thickening of the mid sigmoid colon where diverticulitis has recently been described. However there are now 3 low-attenuation areas which appear pericolonic most consistent with small abscesses. The larger is seen on image 62 series 2 measuring 3.4 x 2.5 cm. This collection contains air and fluid. A second collection is noted involving the sigmoid colon of 2.2 x 0.8 cm on image 57, with a third smaller collection of 11 mm more anteriorly on image 58. These are most consistent with pericolonic abscesses. Multiple rectosigmoid colon diverticula are again noted. In addition, there is now some pericolonic strandiness at the junction of the distal descending and proximal sigmoid colon which was not present previously. This most likely represents mild acute diverticulitis without complicating features at that site. The  right colon is unremarkable. The appendix and terminal ileum appear normal. Vascular/Lymphatic: The abdominal aorta is normal in caliber with moderate abdominal aortic atherosclerosis present. No adenopathy is seen. Reproductive: The uterus is normal in size. No adnexal lesion is seen. No free fluid is seen within the pelvis. Other: None. Musculoskeletal: There is mild 5 mm anterolisthesis of L4 on L5 which appears to be due to degenerative change of the facet joints. The SI joints appear well corticated. IMPRESSION: 1. Mucosal edema remains at the site of diverticulitis of the sigmoid colon previously described. However there are now 3 pericolonic abscesses associated with that site of rectosigmoid colon diverticulitis. 2. There is a new focus of mild diverticulitis involving the junction of the distal descending and proximal sigmoid colon without complication. 3. Edema of the dome of the urinary bladder probably due to the adjacent abscess. No fistulous communication is evident. 4. Nonobstructing renal calculi on the left. 5. Moderate abdominal aortic atherosclerosis. Electronically Signed   By: Ivar Drape M.D.   On: 10/11/2017 11:21   ASSESSMENT AND PLAN:   [name]  is a 83 y.o. female with a known history per below which also includes chronic neutropenia followed by oncology, history of diverticulitis-2 episodes of exacerbation within the last year, chronic temporal arteritis on chronic steroids for 17 years, presents from [place] [facility] to the emergency room for generalized weakness, fatigue, left-sided abdominal pain, ill  feeling since Friday last week  *Acute recurrent sigmoid diverticulitis with multiple associated abscesses - empiric Zosyn IV, IV fluids for rehydration - general surgery consulted--appreciate input. Patient and family are now agreeable with sigmoid colectomy with an colostomy. - pain protocol prn -patient does not have any cardiac problems. She has no hypertension or  diabetes to COPD. She is at a low to intermediate risk for surgery. This was discussed with surgeon.  *mild neutropenia White count is normal and may be due to ongoing infection  *Chronic GERD without esophagitis Stable PPI daily  * temporal arthritis. Patient is on low dose prednisone. She follows with Dr. Jefm Bryant.  D/w dr Peyton Najjar and pt's dter  Case discussed with Care Management/Social Worker. Management plans discussed with the patient, family and they are in agreement.  CODE STATUS: FULL  DVT Prophylaxis: lovenox after surgery  TOTAL TIME TAKING CARE OF THIS PATIENT: *30* minutes.  >50% time spent on counselling and coordination of care  POSSIBLE D/C IN *few* DAYS, DEPENDING ON CLINICAL CONDITION.  Note: This dictation was prepared with Dragon dictation along with smaller phrase technology. Any transcriptional errors that result from this process are unintentional.  Fritzi Mandes M.D on 10/12/2017 at 2:20 PM  Between 7am to 6pm - Pager - [phone]  After 6pm go to [email or web address] - password [email or web address] [facility]  Office  [phone]  CC: Primary care physician; Baxter Hire, MDPatient ID: [name], female   DOB: 03/29/1935, 83 y.o.   MRN: [id number]

## 2017-10-13 ENCOUNTER — Inpatient Hospital Stay: Payer: Medicare Other | Admitting: Anesthesiology

## 2017-10-13 ENCOUNTER — Encounter: Payer: Self-pay | Admitting: Anesthesiology

## 2017-10-13 ENCOUNTER — Encounter
Admission: EM | Disposition: A | Discharge status: Home Health | Payer: Self-pay | Source: Home / Self Care | Attending: Internal Medicine

## 2017-10-13 DIAGNOSIS — K5720 Diverticulitis of large intestine with perforation and abscess without bleeding: Secondary | ICD-10-CM

## 2017-10-13 HISTORY — PX: COLON RESECTION SIGMOID: SHX6737

## 2017-10-13 HISTORY — PX: COLOSTOMY: SHX63

## 2017-10-13 SURGERY — COLECTOMY, SIGMOID, OPEN
Anesthesia: General

## 2017-10-13 MED ORDER — SUGAMMADEX SODIUM 200 MG/2ML IV SOLN
INTRAVENOUS | Status: AC
  Filled 2017-10-13: qty 2

## 2017-10-13 MED ORDER — LIDOCAINE HCL (PF) 2 % IJ SOLN
INTRAMUSCULAR | Status: AC
  Filled 2017-10-13: qty 10

## 2017-10-13 MED ORDER — SUGAMMADEX SODIUM 200 MG/2ML IV SOLN
INTRAVENOUS | Status: DC | PRN
  Administered 2017-10-13: 200 mg via INTRAVENOUS

## 2017-10-13 MED ORDER — HYDROMORPHONE HCL 1 MG/ML IJ SOLN
0.5000 mg | INTRAMUSCULAR | Status: DC | PRN
  Administered 2017-10-13 (×2): 0.5 mg via INTRAVENOUS

## 2017-10-13 MED ORDER — PROPOFOL 10 MG/ML IV BOLUS
INTRAVENOUS | Status: AC
  Filled 2017-10-13: qty 20

## 2017-10-13 MED ORDER — ACETAMINOPHEN 500 MG PO TABS
1000.0000 mg | ORAL_TABLET | Freq: Three times a day (TID) | ORAL | Status: DC
  Administered 2017-10-13 – 2017-10-18 (×12): 1000 mg via ORAL
  Filled 2017-10-13 (×13): qty 2

## 2017-10-13 MED ORDER — BUPIVACAINE LIPOSOME 1.3 % IJ SUSP
INTRAMUSCULAR | Status: AC
  Filled 2017-10-13: qty 20

## 2017-10-13 MED ORDER — FENTANYL CITRATE (PF) 100 MCG/2ML IJ SOLN
INTRAMUSCULAR | Status: AC
  Filled 2017-10-13: qty 2

## 2017-10-13 MED ORDER — ACETAMINOPHEN 10 MG/ML IV SOLN
INTRAVENOUS | Status: AC
  Filled 2017-10-13: qty 100

## 2017-10-13 MED ORDER — FENTANYL CITRATE (PF) 100 MCG/2ML IJ SOLN
25.0000 ug | INTRAMUSCULAR | Status: DC | PRN
  Administered 2017-10-13 (×2): 50 ug via INTRAVENOUS

## 2017-10-13 MED ORDER — SODIUM CHLORIDE FLUSH 0.9 % IV SOLN
INTRAVENOUS | Status: AC
  Filled 2017-10-13: qty 10

## 2017-10-13 MED ORDER — ROCURONIUM BROMIDE 100 MG/10ML IV SOLN
INTRAVENOUS | Status: DC | PRN
  Administered 2017-10-13: 40 mg via INTRAVENOUS

## 2017-10-13 MED ORDER — PROPOFOL 10 MG/ML IV BOLUS
INTRAVENOUS | Status: DC | PRN
  Administered 2017-10-13: 120 mg via INTRAVENOUS

## 2017-10-13 MED ORDER — LACTATED RINGERS IV SOLN
INTRAVENOUS | Status: DC | PRN
  Administered 2017-10-13: 13:00:00 via INTRAVENOUS

## 2017-10-13 MED ORDER — PREDNISONE 2.5 MG PO TABS
2.5000 mg | ORAL_TABLET | Freq: Every day | ORAL | Status: DC
  Administered 2017-10-14 – 2017-10-18 (×5): 2.5 mg via ORAL
  Filled 2017-10-13 (×5): qty 1

## 2017-10-13 MED ORDER — PHENYLEPHRINE HCL 10 MG/ML IJ SOLN
INTRAMUSCULAR | Status: DC | PRN
  Administered 2017-10-13: 100 ug via INTRAVENOUS
  Administered 2017-10-13 (×4): 200 ug via INTRAVENOUS

## 2017-10-13 MED ORDER — TRAMADOL HCL 50 MG PO TABS
50.0000 mg | ORAL_TABLET | Freq: Four times a day (QID) | ORAL | Status: AC | PRN
  Administered 2017-10-14: 50 mg via ORAL
  Filled 2017-10-13: qty 1

## 2017-10-13 MED ORDER — LIDOCAINE HCL (CARDIAC) PF 100 MG/5ML IV SOSY
PREFILLED_SYRINGE | INTRAVENOUS | Status: DC | PRN
  Administered 2017-10-13: 70 mg via INTRAVENOUS

## 2017-10-13 MED ORDER — PROMETHAZINE HCL 25 MG/ML IJ SOLN
INTRAMUSCULAR | Status: AC
  Filled 2017-10-13: qty 1

## 2017-10-13 MED ORDER — FENTANYL CITRATE (PF) 100 MCG/2ML IJ SOLN
INTRAMUSCULAR | Status: DC | PRN
  Administered 2017-10-13 (×4): 50 ug via INTRAVENOUS

## 2017-10-13 MED ORDER — GABAPENTIN 300 MG PO CAPS
300.0000 mg | ORAL_CAPSULE | Freq: Two times a day (BID) | ORAL | Status: DC
  Administered 2017-10-13 – 2017-10-18 (×10): 300 mg via ORAL
  Filled 2017-10-13 (×10): qty 1

## 2017-10-13 MED ORDER — BUPIVACAINE-EPINEPHRINE (PF) 0.25% -1:200000 IJ SOLN
INTRAMUSCULAR | Status: AC
  Filled 2017-10-13: qty 30

## 2017-10-13 MED ORDER — SODIUM CHLORIDE 0.9 % IJ SOLN
INTRAMUSCULAR | Status: AC
  Filled 2017-10-13: qty 50

## 2017-10-13 MED ORDER — KETOROLAC TROMETHAMINE 15 MG/ML IJ SOLN
15.0000 mg | Freq: Three times a day (TID) | INTRAMUSCULAR | Status: AC | PRN
  Administered 2017-10-13 – 2017-10-16 (×3): 15 mg via INTRAVENOUS
  Filled 2017-10-13 (×3): qty 1

## 2017-10-13 MED ORDER — ONDANSETRON HCL 4 MG/2ML IJ SOLN
INTRAMUSCULAR | Status: DC | PRN
  Administered 2017-10-13: 4 mg via INTRAVENOUS

## 2017-10-13 MED ORDER — PROMETHAZINE HCL 25 MG/ML IJ SOLN: 6.2500 mg | Freq: Once | INTRAMUSCULAR | Status: DC

## 2017-10-13 MED ORDER — DEXAMETHASONE SODIUM PHOSPHATE 10 MG/ML IJ SOLN
INTRAMUSCULAR | Status: DC | PRN
  Administered 2017-10-13: 10 mg via INTRAVENOUS

## 2017-10-13 MED ORDER — ONDANSETRON HCL 4 MG/2ML IJ SOLN: 4.0000 mg | Freq: Once | INTRAMUSCULAR | Status: DC | PRN

## 2017-10-13 MED ORDER — SODIUM CHLORIDE 0.9 % IV SOLN
INTRAVENOUS | Status: DC | PRN
  Administered 2017-10-13: 70 mL

## 2017-10-13 MED ORDER — HYDROMORPHONE HCL 1 MG/ML IJ SOLN
INTRAMUSCULAR | Status: AC
  Filled 2017-10-13: qty 1

## 2017-10-13 MED ORDER — ACETAMINOPHEN 10 MG/ML IV SOLN
INTRAVENOUS | Status: DC | PRN
  Administered 2017-10-13: 1000 mg via INTRAVENOUS

## 2017-10-13 SURGICAL SUPPLY — 57 items
APPLIER CLIP 11 MED OPEN (CLIP)
APPLIER CLIP 13 LRG OPEN (CLIP)
APR CLP LRG 13 20 CLIP (CLIP)
APR CLP MED 11 20 MLT OPN (CLIP)
CANISTER SUCT 1200ML W/VALVE (MISCELLANEOUS) ×3 IMPLANT
CATH URET ROBINSON 16FR STRL (CATHETERS) ×1 IMPLANT
CHLORAPREP W/TINT 26ML (MISCELLANEOUS) ×3 IMPLANT
CLIP APPLIE 11 MED OPEN (CLIP) IMPLANT
CLIP APPLIE 13 LRG OPEN (CLIP) IMPLANT
DRAPE LAPAROTOMY 100X77 ABD (DRAPES) ×3 IMPLANT
DRAPE LEGGINS SURG 28X43 STRL (DRAPES) ×3 IMPLANT
DRAPE UNDER BUTTOCK W/FLU (DRAPES) ×1 IMPLANT
DRSG OPSITE POSTOP 4X10 (GAUZE/BANDAGES/DRESSINGS) ×2 IMPLANT
DRSG OPSITE POSTOP 4X8 (GAUZE/BANDAGES/DRESSINGS) ×3 IMPLANT
ELECT BLADE 6.5 EXT (BLADE) ×3 IMPLANT
ELECT CAUTERY BLADE 6.4 (BLADE) ×3 IMPLANT
ELECT REM PT RETURN 9FT ADLT (ELECTROSURGICAL) ×3
ELECTRODE REM PT RTRN 9FT ADLT (ELECTROSURGICAL) ×1 IMPLANT
GLOVE BIO SURGEON STRL SZ7 (GLOVE) ×20 IMPLANT
GOWN STRL REUS W/ TWL LRG LVL3 (GOWN DISPOSABLE) ×4 IMPLANT
GOWN STRL REUS W/TWL LRG LVL3 (GOWN DISPOSABLE) ×18
HANDLE SUCTION POOLE (INSTRUMENTS) ×1 IMPLANT
HANDLE YANKAUER SUCT BULB TIP (MISCELLANEOUS) ×2 IMPLANT
HOLDER FOLEY CATH W/STRAP (MISCELLANEOUS) ×3 IMPLANT
KIT OSTOMY 2 PC DRNBL 2.25 STR (WOUND CARE) IMPLANT
KIT OSTOMY DRAINABLE 2.25 STR (WOUND CARE) ×3
KIT TURNOVER CYSTO (KITS) ×3 IMPLANT
LIGASURE IMPACT 36 18CM CVD LR (INSTRUMENTS) ×3 IMPLANT
NEEDLE HYPO 22GX1.5 SAFETY (NEEDLE) ×3 IMPLANT
NS IRRIG 1000ML POUR BTL (IV SOLUTION) ×3 IMPLANT
PACK BASIN MAJOR ARMC (MISCELLANEOUS) ×3 IMPLANT
PACK COLON CLEAN CLOSURE (MISCELLANEOUS) ×3 IMPLANT
PAD PREP 24X41 OB/GYN DISP (PERSONAL CARE ITEMS) ×3 IMPLANT
SOL PREP PVP 2OZ (MISCELLANEOUS)
SOLUTION PREP PVP 2OZ (MISCELLANEOUS) ×1 IMPLANT
SPONGE LAP 18X18 5 PK (GAUZE/BANDAGES/DRESSINGS) ×3 IMPLANT
STAPLER CUT CVD 40MM BLUE (STAPLE) ×2 IMPLANT
STAPLER CUT RELOAD BLUE (STAPLE) ×2 IMPLANT
STAPLER SKIN PROX 35W (STAPLE) IMPLANT
SUCTION POOLE HANDLE (INSTRUMENTS) ×3
SURGILUBE 2OZ TUBE FLIPTOP (MISCELLANEOUS) ×3 IMPLANT
SUT PDS AB 1 TP1 96 (SUTURE) ×4 IMPLANT
SUT PDS PLUS 0 (SUTURE) ×8
SUT PDS PLUS AB 0 CT-2 (SUTURE) ×4 IMPLANT
SUT PROLENE 2 0 FS (SUTURE) ×2 IMPLANT
SUT SILK 0 SH 30 (SUTURE) IMPLANT
SUT SILK 2 0 (SUTURE) ×3
SUT SILK 2 0SH CR/8 30 (SUTURE) ×3 IMPLANT
SUT SILK 2-0 18XBRD TIE 12 (SUTURE) ×1 IMPLANT
SUT VIC AB 3-0 SH 27 (SUTURE) ×6
SUT VIC AB 3-0 SH 27X BRD (SUTURE) ×2 IMPLANT
SUT VICRYL 3-0 CR8 SH (SUTURE) ×2 IMPLANT
SWAB DUAL CULTURE TRANS RED ST (MISCELLANEOUS) ×2 IMPLANT
SYR 20CC LL (SYRINGE) ×6 IMPLANT
SYRINGE IRR TOOMEY STRL 70CC (SYRINGE) ×3 IMPLANT
TOWEL OR 17X26 4PK STRL BLUE (TOWEL DISPOSABLE) ×3 IMPLANT
TRAY FOLEY MTR SLVR 16FR STAT (SET/KITS/TRAYS/PACK) ×3 IMPLANT

## 2017-10-13 NOTE — Op Note (Signed)
Preoperative diagnosis: Diverticular disease with perforation and abscess.  Postoperative diagnosis: Diverticular disease with perforation and abscess.  Procedure: Sigmoind and  Left colon resection with colostomy (Hartmann's procedure).  Splenic flexure takedown  Anesthesia: GETA  Surgeon: Dr. Windell Moment, MD  Assistant Surgeon: Dr. Dahlia Byes (due to the complexity of the case with the immunocompromised patient, chronic steroid, perforated diverticulitis with abscess, assistant surgeon was necessary.   Wound Classification: Dirty  Indications:  Patient is a 83 y.o. female with abdominal pain, history of fever, recurrent bouts of diverticulitis was found to have an acute perforation with contained abscess of the sigmoid colon. Due to the recurrent episodes and failure to be out of the hospital for 6 weeks without recurrence and the severity of the recurrence with episodes of sepsis, resection in this admission was indicated.  Description of procedure:  The patient was placed in the supine position and general endotracheal anesthesia was induced. A time-out was completed verifying correct patient, procedure, site, positioning, and implant(s) and/or special equipment prior to beginning this procedure. A Foley catheter and orogastric tube were placed. The abdomen was prepped and draped in the usual sterile fashion. A vertical midline incision was made from supraumbilical area to just above the pubis. This was deepened through the subcutaneous tissues and hemostasis was achieved with electrocautery. The linea alba was identified and incised and the peritoneal cavity entered. The abdomen was explored. Adhesions were lysed sharply under direct vision with Metzenbaum scissors. Localized peritonitis was found. The sigmoid colon was adhere to the uterus and bladder with contained abscess drained and cultured.  The small bowel was inspected and retracted to the right using a moist towel and self-retaining  retractor. Using electrocautery, the colon was freed from its peritoneal attachments along the line of Toldt proximally from the splenic flexure and distally to the pelvic inlet. Both ureters were identified and protected. Omentum was freed from the transverse colon and the splenic flexure was taken down from its adhesions to decrease the tension of the descending colon that will be used as colostomy.  Points of transection were selected proximally and distally. An induration was palpated on the descending colon and is suspected to be a mass. The point of transection was 5 cm proximal to that suspected mass.  The bowel was divided with the Contour cutting stapler. The peritoneum overlying the mesentery was then scored with electrocautery and the left colic artery was identified, double ligated with 2-0 vicryl sutures, and transected. The peritoneum overlying the mesentery was scored and remaining mesentery divided and ligated with Ligasure device.  The specimen was removed, proximal and distal ends tagged, and sent to pathology. The abdominal cavity was then copiously irrigated and hemostasis was checked.  The proximal colon reached easily to the proposed colostomy site without tension. A disk of skin was removed from the colostomy site in the left lower quadrant. The incision was deepened through all layers of the abdominal wall and dilated to admit two fingers. The colon was passed out through the ostomy site without torsion or tension.    A closed suction drain was placed in the pelvis and brought out through separate stab wounds lateral to the incision. These were secured with 2-0 prolene.  The fascia was closed with a running suture of PDS 1. The skin was closed with skin staples.  The colostomy was matured with multiple interrupted sutures of 3-0 Vicryl. An ostomy bag was applied.  The patient tolerated the procedure well and was taken to the  postanesthesia care unit in stable condition.   Specimen:  Descending and Sigmoid colon  Complications: None  EBL: 100 mL

## 2017-10-13 NOTE — Transfer of Care (Signed)
Immediate Anesthesia Transfer of Care Note  Patient: [name]  Procedure(s) Performed: COLON RESECTION SIGMOID (N/A ) COLOSTOMY (N/A )  Patient Location: PACU  Anesthesia Type:General  Level of Consciousness: awake, alert  and patient cooperative  Airway & Oxygen Therapy: Patient Spontanous Breathing and Patient connected to nasal cannula oxygen  Post-op Assessment: Report given to RN and Post -op Vital signs reviewed and stable  Post vital signs: Reviewed and stable  Last Vitals:  Vitals Value Taken Time  BP 148/42 10/13/2017  3:36 PM  Temp    Pulse 91 10/13/2017  3:38 PM  Resp 21 10/13/2017  3:38 PM  SpO2 100 % 10/13/2017  3:38 PM  Vitals shown include unvalidated device data.  Last Pain:  Vitals:   10/13/17 0800  TempSrc:   PainSc: 0-No pain         Complications: No apparent anesthesia complications

## 2017-10-13 NOTE — Anesthesia Procedure Notes (Signed)
Procedure Name: Intubation Date/Time: 10/13/2017 1:00 PM Performed by: Sherrine Maples, CRNA Pre-anesthesia Checklist: Patient identified, Emergency Drugs available, Suction available, Patient being monitored and Timeout performed Patient Re-evaluated:Patient Re-evaluated prior to induction Oxygen Delivery Method: Circle system utilized Preoxygenation: Pre-oxygenation with 100% oxygen Induction Type: IV induction Ventilation: Mask ventilation without difficulty Laryngoscope Size: Miller and 2 Grade View: Grade I Tube type: Oral Tube size: 7.0 mm Number of attempts: 1 Airway Equipment and Method: Stylet Placement Confirmation: ETT inserted through vocal cords under direct vision and positive ETCO2 Secured at: 21 cm Tube secured with: Tape Dental Injury: Teeth and Oropharynx as per pre-operative assessment

## 2017-10-13 NOTE — Anesthesia Preprocedure Evaluation (Signed)
Anesthesia Evaluation  Patient identified by MRN, date of birth, ID band Patient awake    Reviewed: Allergy & Precautions, H&P , NPO status , Patient's Chart, lab work & pertinent test results, reviewed documented beta blocker date and time   History of Anesthesia Complications Negative for: history of anesthetic complications  Airway Mallampati: I  TM Distance: >3 FB Neck ROM: full    Dental  (+) Dental Advidsory Given, Caps, Teeth Intact   Pulmonary neg pulmonary ROS, former smoker,           Cardiovascular Exercise Tolerance: Good negative cardio ROS       Neuro/Psych PSYCHIATRIC DISORDERS Depression negative neurological ROS     GI/Hepatic Neg liver ROS, hiatal hernia, GERD  ,  Endo/Other  negative endocrine ROS  Renal/GU Renal disease (kidney stones)  negative genitourinary   Musculoskeletal   Abdominal   Peds  Hematology negative hematology ROS (+)   Anesthesia Other Findings Past Medical History: No date: Breast cancer ([facility])     Comment:  1985  (bilateral mastectomy ) No date: Depression No date: Diverticulosis No date: GERD (gastroesophageal reflux disease) No date: Hiatal hernia No date: History of Bell's palsy No date: History of colon polyps No date: History of nephrolithiasis No date: History of pancreatitis No date: Hyperlipidemia No date: IBS (irritable bowel syndrome) No date: Osteoporosis No date: Skin cancer No date: Temporal arteritis ([facility]) No date: Tubular adenoma of colon   Reproductive/Obstetrics negative OB ROS                             Anesthesia Physical Anesthesia Plan  ASA: II  Anesthesia Plan: General   Post-op Pain Management:    Induction: Intravenous  PONV Risk Score and Plan: 3 and Ondansetron and Dexamethasone  Airway Management Planned: Oral ETT  Additional Equipment:   Intra-op Plan:   Post-operative Plan: Extubation in  OR  Informed Consent: I have reviewed the patients History and Physical, chart, labs and discussed the procedure including the risks, benefits and alternatives for the proposed anesthesia with the patient or authorized representative who has indicated his/her understanding and acceptance.   Dental Advisory Given  Plan Discussed with: Anesthesiologist, CRNA and Surgeon  Anesthesia Plan Comments:         Anesthesia Quick Evaluation

## 2017-10-13 NOTE — Progress Notes (Signed)
Daughter is asking if Toradol can be ordered as Morphine has caused some confusion in the past.  Dr. Windell Moment was paged, notified and provided an order for Toradol.

## 2017-10-13 NOTE — Anesthesia Post-op Follow-up Note (Signed)
Anesthesia QCDR form completed.        

## 2017-10-13 NOTE — Anesthesia Postprocedure Evaluation (Signed)
Anesthesia Post Note  Patient: [name]  Procedure(s) Performed: COLON RESECTION SIGMOID (N/A ) COLOSTOMY (N/A )  Patient location during evaluation: PACU Anesthesia Type: General Level of consciousness: awake and alert Pain management: pain level controlled Vital Signs Assessment: post-procedure vital signs reviewed and stable Respiratory status: spontaneous breathing, nonlabored ventilation, respiratory function stable and patient connected to nasal cannula oxygen Cardiovascular status: blood pressure returned to baseline and stable Postop Assessment: no apparent nausea or vomiting Anesthetic complications: no     Last Vitals:  Vitals:   10/13/17 1713 10/13/17 1848  BP: (!) 160/77 (!) 131/55  Pulse: 90 85  Resp:    Temp: (!) 36.4 C (!) 36.4 C  SpO2: 96% 96%    Last Pain:  Vitals:   10/13/17 1848  TempSrc: Oral  PainSc:                  Martha Clan

## 2017-10-13 NOTE — Progress Notes (Signed)
[facility] [place] NAME: [name]    MR#:  [id number]  DATE OF BIRTH:  June 12, 1934  SUBJECTIVE:  I'm ready for the surgery  REVIEW OF SYSTEMS:   Review of Systems  Constitutional: Negative for chills, fever and weight loss.  HENT: Negative for ear discharge, ear pain and nosebleeds.   Eyes: Negative for blurred vision, pain and discharge.  Respiratory: Negative for sputum production, shortness of breath, wheezing and stridor.   Cardiovascular: Negative for chest pain, palpitations, orthopnea and PND.  Gastrointestinal: Positive for abdominal pain. Negative for diarrhea, nausea and vomiting.  Genitourinary: Negative for frequency and urgency.  Musculoskeletal: Negative for back pain and joint pain.  Neurological: Negative for sensory change, speech change, focal weakness and weakness.  Psychiatric/Behavioral: Negative for depression and hallucinations. The patient is not nervous/anxious.    Tolerating Diet: clear liquid Tolerating PT: ambulatory  DRUG ALLERGIES:   Allergies  Allergen Reactions  . Benzalkonium Chloride Dermatitis    Other reaction(s): Unknown  . Escitalopram     Deveioped a twitch  . Neomycin-Bacitracin Zn-Polymyx Dermatitis    Other reaction(s): Other (See Comments) REACTION: blisters skin REACTION: blisters skin REACTION: blisters skin    VITALS:  Blood pressure (!) 133/58, pulse 84, temperature 97.7 F (36.5 C), temperature source Oral, resp. rate 20, height 5\' 5"  (1.651 m), weight 67.7 kg (149 lb 4.8 oz), SpO2 99 %.  PHYSICAL EXAMINATION:   Physical Exam  GENERAL:  83 y.o.-year-old patient lying in the bed with no acute distress.  EYES: Pupils equal, round, reactive to light and accommodation. No scleral icterus. Extraocular muscles intact.  HEENT: Head atraumatic, normocephalic. Oropharynx and nasopharynx clear.  NECK:  Supple, no jugular venous distention. No thyroid enlargement, no  tenderness.  LUNGS: Normal breath sounds bilaterally, no wheezing, rales, rhonchi. No use of accessory muscles of respiration.  CARDIOVASCULAR: S1, S2 normal. No murmurs, rubs, or gallops.  ABDOMEN: Soft, nontender, nondistended. Bowel sounds present. No organomegaly or mass.  EXTREMITIES: No cyanosis, clubbing or edema b/l.    NEUROLOGIC: Cranial nerves II through XII are intact. No focal Motor or sensory deficits b/l.   PSYCHIATRIC:  patient is alert and oriented x 3.  SKIN: No obvious rash, lesion, or ulcer.   LABORATORY PANEL:  CBC Recent Labs  Lab 10/12/17 0528  WBC 1.8*  HGB 12.5  HCT 37.7  PLT 273    Chemistries  Recent Labs  Lab 10/11/17 0915 10/12/17 0528  NA 137 138  K 3.7 3.7  CL 103 107  CO2 24 24  GLUCOSE 100* 214*  BUN 10 7  CREATININE 0.93 0.86  CALCIUM 8.7* 8.2*  AST 24  --   ALT 11*  --   ALKPHOS 90  --   BILITOT 1.1  --    Cardiac Enzymes Recent Labs  Lab 10/11/17 0949  TROPONINI <0.03   RADIOLOGY:  No results found. ASSESSMENT AND PLAN:   Keiarra Charon  is a 83 y.o. female with a known history per below which also includes chronic neutropenia followed by oncology, history of diverticulitis-2 episodes of exacerbation within the last year, chronic temporal arteritis on chronic steroids for 17 years, presents from [place] [facility] to the emergency room for generalized weakness, fatigue, left-sided abdominal pain, ill feeling since Friday last week  *Acute recurrent sigmoid diverticulitis with multiple associated abscesses - empiric Zosyn IV -IV fluids for rehydration - general surgery consulted--appreciate input. Patient and family are now agreeable with  sigmoid colectomy with an colostomy-- scheduled for today. - pain protocol prn -patient does not have any cardiac problems. She has no hypertension or diabetes to COPD. She is at a low to intermediate risk for surgery. This was discussed with surgeon.  *mild neutropenia White count is  normal and may be due to ongoing infection  *Chronic GERD without esophagitis Stable PPI daily  * temporal arthritis. Patient is on low dose prednisone. She follows with Dr. Jefm Bryant.  D/w dr Peyton Najjar and pt's dter  Case discussed with Care Management/Social Worker. Management plans discussed with the patient, family and they are in agreement.  CODE STATUS: FULL  DVT Prophylaxis: lovenox after surgery  TOTAL TIME TAKING CARE OF THIS PATIENT: *30* minutes.  >50% time spent on counselling and coordination of care  POSSIBLE D/C IN *few* DAYS, DEPENDING ON CLINICAL CONDITION.  Note: This dictation was prepared with Dragon dictation along with smaller phrase technology. Any transcriptional errors that result from this process are unintentional.  Fritzi Mandes M.D on 10/13/2017 at 1:39 PM  Between 7am to 6pm - Pager - [phone]  After 6pm go to [email or web address] - password [email or web address] [facility]  Office  [phone]  CC: Primary care physician; Baxter Hire, MDPatient ID: [name], female   DOB: 03/24/1935, 83 y.o.   MRN: [id number]

## 2017-10-13 NOTE — Interval H&P Note (Signed)
History and Physical Interval Note:  10/13/2017 12:41 PM  [name]  has presented today for surgery, with the diagnosis of diverticulitis  The various methods of treatment have been discussed with the patient and family. After consideration of risks, benefits and other options for treatment, the patient has consented to  Procedure(s): COLON RESECTION SIGMOID (N/A) COLOSTOMY (N/A) as a surgical intervention .  The patient's history has been reviewed, patient examined, no change in status, stable for surgery.  I have reviewed the patient's chart and labs.  Questions were answered to the patient's satisfaction.     Herbert Pun

## 2017-10-14 ENCOUNTER — Encounter: Payer: Self-pay | Admitting: General Surgery

## 2017-10-14 LAB — CBC WITH DIFFERENTIAL/PLATELET
Basophils Absolute: 0 10*3/uL (ref 0–0.1)
Basophils Relative: 0 %
Eosinophils Absolute: 0 10*3/uL (ref 0–0.7)
Eosinophils Relative: 0 %
HEMATOCRIT: 35.7 % (ref 35.0–47.0)
Hemoglobin: 11.9 g/dL — ABNORMAL LOW (ref 12.0–16.0)
Lymphocytes Relative: 7 %
Lymphs Abs: 0.6 10*3/uL — ABNORMAL LOW (ref 1.0–3.6)
MCH: 30.4 pg (ref 26.0–34.0)
MCHC: 33.3 g/dL (ref 32.0–36.0)
MCV: 91.3 fL (ref 80.0–100.0)
MONO ABS: 0.4 10*3/uL (ref 0.2–0.9)
MONOS PCT: 5 %
NEUTROS ABS: 7.1 10*3/uL — AB (ref 1.4–6.5)
NEUTROS PCT: 88 %
Platelets: 265 10*3/uL (ref 150–440)
RBC: 3.91 MIL/uL (ref 3.80–5.20)
RDW: 14.2 % (ref 11.5–14.5)
WBC: 8.1 10*3/uL (ref 3.6–11.0)

## 2017-10-14 LAB — COMPREHENSIVE METABOLIC PANEL
ALT: 23 U/L (ref 14–54)
ANION GAP: 6 (ref 5–15)
AST: 48 U/L — ABNORMAL HIGH (ref 15–41)
Albumin: 2.6 g/dL — ABNORMAL LOW (ref 3.5–5.0)
Alkaline Phosphatase: 71 U/L (ref 38–126)
BILIRUBIN TOTAL: 0.6 mg/dL (ref 0.3–1.2)
BUN: 11 mg/dL (ref 6–20)
CO2: 23 mmol/L (ref 22–32)
Calcium: 7.7 mg/dL — ABNORMAL LOW (ref 8.9–10.3)
Chloride: 111 mmol/L (ref 101–111)
Creatinine, Ser: 0.98 mg/dL (ref 0.44–1.00)
GFR calc Af Amer: >60 mL/min (ref >60)
GFR calc non Af Amer: 53 mL/min — ABNORMAL LOW (ref >60)
Glucose, Bld: 189 mg/dL — ABNORMAL HIGH (ref 65–99)
POTASSIUM: 4.1 mmol/L (ref 3.5–5.1)
Sodium: 140 mmol/L (ref 135–145)
TOTAL PROTEIN: 5.8 g/dL — AB (ref 6.5–8.1)

## 2017-10-14 MED ORDER — ENOXAPARIN SODIUM 40 MG/0.4ML ~~LOC~~ SOLN
40.0000 mg | SUBCUTANEOUS | Status: DC
Start: 2017-10-14 — End: 2017-10-18
  Administered 2017-10-14 – 2017-10-17 (×4): 40 mg via SUBCUTANEOUS
  Filled 2017-10-14 (×4): qty 0.4

## 2017-10-14 NOTE — Progress Notes (Signed)
Patient ID: [name], female   DOB: Feb 20, 1935, 83 y.o.   MRN: [id number]     [facility] Hospital Day(s): 3.   Post op day(s): 1 Day Post-Op.   Interval History: Patient seen and examined, no acute events or new complaints overnight. Patient reports still has significant pain but is feeling better than yesterday. Denies nausea or vomiting. Has not been out of bed yet. Denies passing gas per ostomy.   Vital signs in last 24 hours: [min-max] current  Temp:  [97.5 F (36.4 C)-98.4 F (36.9 C)] 97.5 F (36.4 C) (05/19 0551) Pulse Rate:  [74-92] 74 (05/19 0551) Resp:  [17-24] 20 (05/19 0551) BP: (126-165)/(42-77) 126/44 (05/19 0551) SpO2:  [96 %-100 %] 100 % (05/19 0551)     Height: 5\' 5"  (165.1 cm) Weight: 67.7 kg (149 lb 4.8 oz) BMI (Calculated): 24.84   Physical Exam:  Constitutional: alert, cooperative and no distress  Respiratory: breathing non-labored at rest  Cardiovascular: regular rate and sinus rhythm  Gastrointestinal: soft, moderate tenderness on lower quadrants, and non-distended. Ostomy pink and patent. Drain on right lower quadrant in place with dark bloody material.   Labs:  CBC Latest Ref Rng & Units 10/14/2017 10/12/2017 10/11/2017  WBC 3.6 - 11.0 K/uL 8.1 1.8(L) 3.7  Hemoglobin 12.0 - 16.0 g/dL 11.9(L) 12.5 12.9  Hematocrit 35.0 - 47.0 % 35.7 37.7 37.6  Platelets 150 - 440 K/uL 265 273 309   CMP Latest Ref Rng & Units 10/14/2017 10/12/2017 10/11/2017  Glucose 65 - 99 mg/dL 189(H) 214(H) 100(H)  BUN 6 - 20 mg/dL 11 7 10   Creatinine 0.44 - 1.00 mg/dL 0.98 0.86 0.93  Sodium 135 - 145 mmol/L 140 138 137  Potassium 3.5 - 5.1 mmol/L 4.1 3.7 3.7  Chloride 101 - 111 mmol/L 111 107 103  CO2 22 - 32 mmol/L 23 24 24   Calcium 8.9 - 10.3 mg/dL 7.7(L) 8.2(L) 8.7(L)  Total Protein 6.5 - 8.1 g/dL 5.8(L) - 7.5  Total Bilirubin 0.3 - 1.2 mg/dL 0.6 - 1.1  Alkaline Phos 38 - 126 U/L 71 - 90  AST 15 - 41 U/L 48(H) - 24  ALT 14 - 54 U/L 23 - 11(L)    Imaging  studies: No new pertinent imaging studies   Assessment/Plan:  83 y.o. female with complicated diverticulitis, recurrent with abscess 1 Day Post-Op s/p Hartman's procedure, complicated by pertinent comorbidities including chronic neutropenia, chronic prednisone, HTN.  Patient today doing a little bit better. Still with significant pain. Not nauseous. Tolerated sips of water. The ostomy has not started passing gas yet. Encourage the patient to get out of bed to a chair today. The urine output is adequate with yellow urine. Adequate JP output, no sign of active bleeding or abscess formation.   Foley needs to stay in place due to significant manipulation around the bladder with abscess connected to the bladder. No sign of fistula.   Arnold Long, MD

## 2017-10-14 NOTE — Progress Notes (Signed)
[name] at [place] NAME: [name]    MR#:  [id number]  DATE OF BIRTH:  10-23-1934  SUBJECTIVE:  patient doing well postop day one. She tolerated clear liquid diet. Some abdominal pain at surgical site. Daughter in the room.  REVIEW OF SYSTEMS:   Review of Systems  Constitutional: Negative for chills, fever and weight loss.  HENT: Negative for ear discharge, ear pain and nosebleeds.   Eyes: Negative for blurred vision, pain and discharge.  Respiratory: Negative for sputum production, shortness of breath, wheezing and stridor.   Cardiovascular: Negative for chest pain, palpitations, orthopnea and PND.  Gastrointestinal: Positive for abdominal pain. Negative for diarrhea, nausea and vomiting.  Genitourinary: Negative for frequency and urgency.  Musculoskeletal: Negative for back pain and joint pain.  Neurological: Negative for sensory change, speech change, focal weakness and weakness.  Psychiatric/Behavioral: Negative for depression and hallucinations. The patient is not nervous/anxious.    Tolerating Diet: clear liquid Tolerating PT: ambulatory  DRUG ALLERGIES:   Allergies  Allergen Reactions  . Benzalkonium Chloride Dermatitis    Other reaction(s): Unknown  . Escitalopram     Deveioped a twitch  . Neomycin-Bacitracin Zn-Polymyx Dermatitis    Other reaction(s): Other (See Comments) REACTION: blisters skin REACTION: blisters skin REACTION: blisters skin    VITALS:  Blood pressure (!) 126/44, pulse 74, temperature (!) 97.5 F (36.4 C), temperature source Oral, resp. rate 20, height 5\' 5"  (1.651 m), weight 67.7 kg (149 lb 4.8 oz), SpO2 100 %.  PHYSICAL EXAMINATION:   Physical Exam  GENERAL:  83 y.o.-year-old patient lying in the bed with no acute distress.  EYES: Pupils equal, round, reactive to light and accommodation. No scleral icterus. Extraocular muscles intact.  HEENT: Head atraumatic, normocephalic.  Oropharynx and nasopharynx clear.  NECK:  Supple, no jugular venous distention. No thyroid enlargement, no tenderness.  LUNGS: Normal breath sounds bilaterally, no wheezing, rales, rhonchi. No use of accessory muscles of respiration.  CARDIOVASCULAR: S1, S2 normal. No murmurs, rubs, or gallops.  ABDOMEN: Soft, nontender, nondistended. Bowel sounds present. No organomegaly or mass. Colostomy+, dressing present EXTREMITIES: No cyanosis, clubbing or edema b/l.    NEUROLOGIC: Cranial nerves II through XII are intact. No focal Motor or sensory deficits b/l.   PSYCHIATRIC:  patient is alert and oriented x 3.  SKIN: No obvious rash, lesion, or ulcer.   LABORATORY PANEL:  CBC Recent Labs  Lab 10/14/17 0428  WBC 8.1  HGB 11.9*  HCT 35.7  PLT 265    Chemistries  Recent Labs  Lab 10/14/17 0428  NA 140  K 4.1  CL 111  CO2 23  GLUCOSE 189*  BUN 11  CREATININE 0.98  CALCIUM 7.7*  AST 48*  ALT 23  ALKPHOS 71  BILITOT 0.6   Cardiac Enzymes Recent Labs  Lab 10/11/17 0949  TROPONINI <0.03   RADIOLOGY:  No results found. ASSESSMENT AND PLAN:   [name]  is a 83 y.o. female with a known history per below which also includes chronic neutropenia followed by oncology, history of diverticulitis-2 episodes of exacerbation within the last year, chronic temporal arteritis on chronic steroids for 17 years, presents from [place] [facility] to the emergency room for generalized weakness, fatigue, left-sided abdominal pain, ill feeling since Friday last week  *Acute recurrent sigmoid diverticulitis with multiple associated abscesses - empiric Zosyn IV -IV fluids for rehydration - general surgery consulted--appreciate input.  -POD#1 Sigmoind and  Left colon resection with colostomy (  Hartmann's procedure).  Splenic flexure takedown - pain protocol prn  *mild neutropenia White count is now normal and may be due to ongoing infection  *Chronic GERD without esophagitis Stable PPI  daily  * temporal arthritis. Patient is on low dose prednisone. She follows with Dr. Jefm Bryant.  D/w dr Peyton Najjar and pt's dter  Case discussed with Care Management/Social Worker. Management plans discussed with the patient, family and they are in agreement.  CODE STATUS: FULL  DVT Prophylaxis: lovenox after surgery  TOTAL TIME TAKING CARE OF THIS PATIENT: *30* minutes.  >50% time spent on counselling and coordination of care  POSSIBLE D/C IN *few* DAYS, DEPENDING ON CLINICAL CONDITION.  Note: This dictation was prepared with Dragon dictation along with smaller phrase technology. Any transcriptional errors that result from this process are unintentional.  Fritzi Mandes M.D on 10/14/2017 at 12:40 PM  Between 7am to 6pm - Pager - [phone]  After 6pm go to [email or web address] - password [email or web address] [facility]  Office  [phone]  CC: Primary care physician; Baxter Hire, MDPatient ID: [name], female   DOB: 09/09/1934, 83 y.o.   MRN: [id number]

## 2017-10-15 ENCOUNTER — Inpatient Hospital Stay: Payer: Medicare Other

## 2017-10-15 ENCOUNTER — Inpatient Hospital Stay: Payer: Medicare Other | Admitting: Hematology and Oncology

## 2017-10-15 MED ORDER — SODIUM CHLORIDE 0.9 % IV SOLN
INTRAVENOUS | Status: AC
  Administered 2017-10-15 – 2017-10-16 (×2): via INTRAVENOUS

## 2017-10-15 MED ORDER — ADULT MULTIVITAMIN W/MINERALS CH
1.0000 | ORAL_TABLET | Freq: Every day | ORAL | Status: DC
  Administered 2017-10-15 – 2017-10-18 (×4): 1 via ORAL
  Filled 2017-10-15 (×4): qty 1

## 2017-10-15 MED ORDER — BOOST / RESOURCE BREEZE PO LIQD CUSTOM
1.0000 | Freq: Three times a day (TID) | ORAL | Status: DC
  Administered 2017-10-15 – 2017-10-16 (×3): 1 via ORAL

## 2017-10-15 NOTE — Progress Notes (Signed)
Patient ID: [name], female   DOB: 1934/07/08, 83 y.o.   MRN: [id number]      [facility] Hospital Day(s): 4.   Post op day(s): 2 Days Post-Op.   Interval History: Patient seen and examined, no acute events or new complaints overnight. Patient reports pain controlled. Denies nausea or vomiting. Denies passing gas or stool through the colostomy.   Vital signs in last 24 hours: [min-max] current  Temp:  [97.5 F (36.4 C)-97.8 F (36.6 C)] 97.5 F (36.4 C) (05/20 0456) Pulse Rate:  [63-75] 75 (05/20 0456) Resp:  [18-20] 18 (05/20 0456) BP: (125-137)/(51-62) 125/51 (05/20 0456) SpO2:  [100 %] 100 % (05/20 0456)     Height: 5\' 5"  (165.1 cm) Weight: 67.7 kg (149 lb 4.8 oz) BMI (Calculated): 24.84   Intake/Output this shift:  Total I/O In: 248.6 [I.V.:248.6] Out: 10 [Drains:10]   Physical Exam:  Constitutional: alert, cooperative and no distress  Respiratory: breathing non-labored at rest  Cardiovascular: regular rate and sinus rhythm  Gastrointestinal: soft, mild-tender, and non-distended. Ostomy pink and patent.   Labs:  CBC Latest Ref Rng & Units 10/14/2017 10/12/2017 10/11/2017  WBC 3.6 - 11.0 K/uL 8.1 1.8(L) 3.7  Hemoglobin 12.0 - 16.0 g/dL 11.9(L) 12.5 12.9  Hematocrit 35.0 - 47.0 % 35.7 37.7 37.6  Platelets 150 - 440 K/uL 265 273 309   CMP Latest Ref Rng & Units 10/14/2017 10/12/2017 10/11/2017  Glucose 65 - 99 mg/dL 189(H) 214(H) 100(H)  BUN 6 - 20 mg/dL 11 7 10   Creatinine 0.44 - 1.00 mg/dL 0.98 0.86 0.93  Sodium 135 - 145 mmol/L 140 138 137  Potassium 3.5 - 5.1 mmol/L 4.1 3.7 3.7  Chloride 101 - 111 mmol/L 111 107 103  CO2 22 - 32 mmol/L 23 24 24   Calcium 8.9 - 10.3 mg/dL 7.7(L) 8.2(L) 8.7(L)  Total Protein 6.5 - 8.1 g/dL 5.8(L) - 7.5  Total Bilirubin 0.3 - 1.2 mg/dL 0.6 - 1.1  Alkaline Phos 38 - 126 U/L 71 - 90  AST 15 - 41 U/L 48(H) - 24  ALT 14 - 54 U/L 23 - 11(L)   Imaging studies: No new pertinent imaging studies  Assessment/Plan:  83  y.o. female  2 Days Post-Op   Acute diverticulitis with abscess status post Hartmans procedure -Recovering properly. Passed gas tonight. Will progress diet. Will continue abx therapy. -Will continue with foley catheter due to severe manipulation of the bladder during surgery. Adequate urine output. No blood on urine.  Patient ambulated today out of the room.  Adequate progress.   Arnold Long, MD

## 2017-10-15 NOTE — Progress Notes (Signed)
[facility] at [place] NAME: [name]    MR#:  [id number]  DATE OF BIRTH:  1935-02-27  SUBJECTIVE:  patient doing well postop day #2. She tolerated clear liquid diet. Some abdominal pain at surgical site. Daughter in the room.  REVIEW OF SYSTEMS:   Review of Systems  Constitutional: Negative for chills, fever and weight loss.  HENT: Negative for ear discharge, ear pain and nosebleeds.   Eyes: Negative for blurred vision, pain and discharge.  Respiratory: Negative for sputum production, shortness of breath, wheezing and stridor.   Cardiovascular: Negative for chest pain, palpitations, orthopnea and PND.  Gastrointestinal: Positive for abdominal pain. Negative for diarrhea, nausea and vomiting.  Genitourinary: Negative for frequency and urgency.  Musculoskeletal: Negative for back pain and joint pain.  Neurological: Negative for sensory change, speech change, focal weakness and weakness.  Psychiatric/Behavioral: Negative for depression and hallucinations. The patient is not nervous/anxious.    Tolerating Diet: clear liquid Tolerating PT: ambulatory  DRUG ALLERGIES:   Allergies  Allergen Reactions  . Benzalkonium Chloride Dermatitis    Other reaction(s): Unknown  . Escitalopram     Deveioped a twitch  . Neomycin-Bacitracin Zn-Polymyx Dermatitis    Other reaction(s): Other (See Comments) REACTION: blisters skin REACTION: blisters skin REACTION: blisters skin    VITALS:  Blood pressure (!) 149/60, pulse 85, temperature 98 F (36.7 C), temperature source Oral, resp. rate 14, height 5\' 5"  (1.651 m), weight 67.7 kg (149 lb 4.8 oz), SpO2 100 %.  PHYSICAL EXAMINATION:   Physical Exam  GENERAL:  83 y.o.-year-old patient lying in the bed with no acute distress.  EYES: Pupils equal, round, reactive to light and accommodation. No scleral icterus. Extraocular muscles intact.  HEENT: Head atraumatic, normocephalic. Oropharynx  and nasopharynx clear.  NECK:  Supple, no jugular venous distention. No thyroid enlargement, no tenderness.  LUNGS: Normal breath sounds bilaterally, no wheezing, rales, rhonchi. No use of accessory muscles of respiration.  CARDIOVASCULAR: S1, S2 normal. No murmurs, rubs, or gallops.  ABDOMEN: Soft, nontender, nondistended. Bowel sounds present. No organomegaly or mass. Colostomy+, dressing present EXTREMITIES: No cyanosis, clubbing or edema b/l.    NEUROLOGIC: Cranial nerves II through XII are intact. No focal Motor or sensory deficits b/l.   PSYCHIATRIC:  patient is alert and oriented x 3.  SKIN: No obvious rash, lesion, or ulcer.   LABORATORY PANEL:  CBC Recent Labs  Lab 10/14/17 0428  WBC 8.1  HGB 11.9*  HCT 35.7  PLT 265    Chemistries  Recent Labs  Lab 10/14/17 0428  NA 140  K 4.1  CL 111  CO2 23  GLUCOSE 189*  BUN 11  CREATININE 0.98  CALCIUM 7.7*  AST 48*  ALT 23  ALKPHOS 71  BILITOT 0.6   Cardiac Enzymes Recent Labs  Lab 10/11/17 0949  TROPONINI <0.03   RADIOLOGY:  No results found. ASSESSMENT AND PLAN:   [name]  is a 83 y.o. female with a known history per below which also includes chronic neutropenia followed by oncology, history of diverticulitis-2 episodes of exacerbation within the last year, chronic temporal arteritis on chronic steroids for 17 years, presents from [place] [facility] to the emergency room for generalized weakness, fatigue, left-sided abdominal pain, ill feeling since Friday last week  *Acute recurrent sigmoid diverticulitis with multiple associated abscesses - empiric Zosyn IV -POD# 2 s/p  Sigmoind and  Left colon resection with colostomy (Hartmann's procedure) with Splenic flexure takedown - pain  protocol prn  *mild neutropenia White count is now normal and may be due to ongoing infection  *Chronic GERD without esophagitis Stable PPI daily  * temporal arthritis. Patient is on low dose prednisone. She follows  with Dr. Jefm Bryant.  Ambulate patient.  D/w dr Peyton Najjar and pt's dter  Case discussed with Care Management/Social Worker. Management plans discussed with the patient, family and they are in agreement.  CODE STATUS: FULL  DVT Prophylaxis: lovenox after surgery  TOTAL TIME TAKING CARE OF THIS PATIENT: *30* minutes.  >50% time spent on counselling and coordination of care  POSSIBLE D/C IN *few* DAYS, DEPENDING ON CLINICAL CONDITION.  Note: This dictation was prepared with Dragon dictation along with smaller phrase technology. Any transcriptional errors that result from this process are unintentional.  Fritzi Mandes M.D on 10/15/2017 at 1:54 PM  Between 7am to 6pm - Pager - [phone]  After 6pm go to [email or web address] - password [email or web address] [facility]  Office  [phone]  CC: Primary care physician; Baxter Hire, MDPatient ID: [name], female   DOB: 06/16/1934, 83 y.o.   MRN: [id number]

## 2017-10-15 NOTE — Progress Notes (Signed)
Nutrition Follow Up Note   DOCUMENTATION CODES:   Not applicable  INTERVENTION:   Boost Breeze po TID, each supplement provides 250 kcal and 9 grams of protein   MVI daily   NUTRITION DIAGNOSIS:   Inadequate oral intake related to acute illness as evidenced by NPO status. - advanced to clears  GOAL:   Patient will meet greater than or equal to 90% of their needs  - not met  MONITOR:   PO intake, Supplement acceptance, Diet advancement, Labs, Weight trends, Skin, I & O's  ASSESSMENT:   83 y.o. female with complicated diverticulitis, complicated by pertinent comorbidities including urinary tract infection, chronic neutropenia, chronic use of oral systemic steroid, suspected adrenal insufficiency.   Pt s/p sigmoind andleft colon resection with colostomy (Hartmann's procedure) with Splenic flexure takedown 5/18  Pt advanced to clear liquids on 5/18; eating only sips and bites of meals. No ostomy output in bag. No new weight since 5/17; recommend obtain new weight. RD will order Boost Breeze to help pt meet her estimated needs. Pt without adequate nutrition for 6 days. If unable to advance pt past clear liquids in 1-2 days recommend TPN. Pt likely at moderate refeeding risk; recommend monitor K, Mg, and P labs.   Medications reviewed and include: prednisone, NaCl @ 98m/hr, lovenox, pepcid, zosyn  Labs reviewed:   Diet Order:   Diet Order           Diet clear liquid Room service appropriate? Yes; Fluid consistency: Thin  Diet effective now         EDUCATION NEEDS:   Education needs have been addressed  Skin:  Skin Assessment: Reviewed RN Assessment(Ecchymosis, abdominal incision )  Last BM:  5/16  Height:   Ht Readings from Last 1 Encounters:  10/11/17 _0  (1.651 m)    Weight:   Wt Readings from Last 1 Encounters:  10/12/17 149 lb 4.8 oz (67.7 kg)    Ideal Body Weight:  56.8 kg  BMI:  Body mass index is 24.84 kg/m.  Estimated Nutritional Needs:    Kcal:  1400-1600kcal/day   Protein:  75-88g/day   Fluid:  >1.4L/day   CKoleen DistanceMS, RD, LDN Pager #- [phone]#- [phone] Hours Pager: [phone]

## 2017-10-16 LAB — CULTURE, BLOOD (ROUTINE X 2)
Culture: NO GROWTH
Culture: NO GROWTH
SPECIAL REQUESTS: ADEQUATE
SPECIAL REQUESTS: ADEQUATE

## 2017-10-16 LAB — SURGICAL PATHOLOGY

## 2017-10-16 MED ORDER — ENSURE ENLIVE PO LIQD
237.0000 mL | Freq: Two times a day (BID) | ORAL | Status: DC
  Administered 2017-10-16 – 2017-10-18 (×5): 237 mL via ORAL

## 2017-10-16 MED ORDER — FAMOTIDINE 20 MG PO TABS
20.0000 mg | ORAL_TABLET | Freq: Every day | ORAL | Status: DC
  Administered 2017-10-17 – 2017-10-18 (×2): 20 mg via ORAL
  Filled 2017-10-16 (×2): qty 1

## 2017-10-16 NOTE — Evaluation (Signed)
Physical Therapy Evaluation Patient Details Name: [name] MRN: [id number] DOB: 09/16/1934 Today's Date: 10/16/2017   History of Present Illness  Pt is an 83 y.o. female with a known history including chronic neutropenia followed by oncology, history of diverticulitis-2 episodes of exacerbation within the last year, chronic temporal arteritis on chronic steroids for 17 years, presents from [facility] to the emergency room for Reiger's, generalized weakness, fatigue, left-sided abdominal pain, recently started on Cipro for UTI.  In the emergency room urinalysis noted for ketones/trace leukocyte esterase, CT abdomen noted for sigmoid diverticulitis with 3 new abscesses, ED attending discussed the case with the patient's general surgeon/Dr. Ferrel Logan who recommended hospitalist admission with surgical consultation, patient evaluated in the emergency room, daughter at the bedside, patient now be admitted for acute recurrent sigmoid diverticulitis with intraperitoneal abscess and peritonitis.  Pt now s/p sigmoid and  left colon resection with colostomy (Hartmann's procedure) and splenic flexure takedown.    Clinical Impression  Pt presents with mild deficits in strength, transfers, mobility, gait, balance, and activity tolerance.  Pt training provided for transfers and bed mobility using the log roll technique for decreased abdominal strain.  Pt ambulated both with and without UE support with slow, cautious cadence and short B step length.  Pt with noticeable decrease in stability while walking without holding the IV pole that presented with mild drifting left/right and a slower cadence.  Pt overall did well during the session but will benefit from HHPT services upon discharge to safely address above deficits for decreased caregiver assistance and eventual return to PLOF.      Follow Up Recommendations Home health PT;Supervision for mobility/OOB    Equipment Recommendations  None recommended  by PT    Recommendations for Other Services       Precautions / Restrictions Precautions Precautions: Fall Restrictions Weight Bearing Restrictions: No      Mobility  Bed Mobility Overal bed mobility: Needs Assistance Bed Mobility: Supine to Sit;Sit to Supine     Supine to sit: Supervision Sit to supine: Supervision   General bed mobility comments: Bed mobility training with log roll technique with min verbal cues for sequencing  Transfers Overall transfer level: Needs assistance Equipment used: None Transfers: Sit to/from Stand Sit to Stand: Supervision         General transfer comment: Good control and stability during transfers  Ambulation/Gait Ambulation/Gait assistance: Supervision Ambulation Distance (Feet): 200 Feet Assistive device: IV Pole;None Gait Pattern/deviations: Step-through pattern;Decreased step length - right;Decreased step length - left;Drifts right/left Gait velocity: Decreased   General Gait Details: Slow, cautious cadence with short B step length with holding the IV pole and without UE support  Stairs            Wheelchair Mobility    Modified Rankin (Stroke Patients Only)       Balance Overall balance assessment: Mild deficits observed, not formally tested                                           Pertinent Vitals/Pain Pain Assessment: No/denies pain    Home Living Family/patient expects to be discharged to:: Private residence Living Arrangements: Alone Available Help at Discharge: Family;Friend(s);Available PRN/intermittently;Available 24 hours/day(24 hours/day supervision for the first several days and then intermittent help) Type of Home: House       Home Layout: One level Home Equipment: Walker - 2 wheels  Prior Function Level of Independence: Independent         Comments: Ind Amb without AD, no fall history, Ind with ADLs     Hand Dominance   Dominant Hand: Right     Extremity/Trunk Assessment   Upper Extremity Assessment Upper Extremity Assessment: Overall WFL for tasks assessed    Lower Extremity Assessment Lower Extremity Assessment: Generalized weakness       Communication   Communication: No difficulties  Cognition Arousal/Alertness: Awake/alert Behavior During Therapy: WFL for tasks assessed/performed Overall Cognitive Status: Within Functional Limits for tasks assessed                                        General Comments      Exercises     Assessment/Plan    PT Assessment Patient needs continued PT services  PT Problem List Decreased strength;Decreased activity tolerance;Decreased mobility;Decreased knowledge of use of DME       PT Treatment Interventions DME instruction;Stair training;Gait training;Functional mobility training;Balance training;Therapeutic exercise;Therapeutic activities;Patient/family education    PT Goals (Current goals can be found in the Care Plan section)  Acute Rehab PT Goals Patient Stated Goal: Improved strength PT Goal Formulation: With patient Time For Goal Achievement: 10/29/17 Potential to Achieve Goals: Good    Frequency Min 2X/week   Barriers to discharge        Co-evaluation               AM-PAC PT "6 Clicks" Daily Activity  Outcome Measure Difficulty turning over in bed (including adjusting bedclothes, sheets and blankets)?: A Little Difficulty moving from lying on back to sitting on the side of the bed? : A Little Difficulty sitting down on and standing up from a chair with arms (e.g., wheelchair, bedside commode, etc,.)?: None Help needed moving to and from a bed to chair (including a wheelchair)?: None Help needed walking in hospital room?: A Little Help needed climbing 3-5 steps with a railing? : A Little 6 Click Score: 20    End of Session Equipment Utilized During Treatment: Gait belt(With careful placement of gait belt to avoid surgical  site) Activity Tolerance: Patient tolerated treatment well Patient left: in chair;with call bell/phone within reach Nurse Communication: Mobility status PT Visit Diagnosis: Difficulty in walking, not elsewhere classified (R26.2);Muscle weakness (generalized) (M62.81)    Time: 3614-4315 PT Time Calculation (min) (ACUTE ONLY): 25 min   Charges:   PT Evaluation $PT Eval Low Complexity: 1 Low PT Treatments $Therapeutic Activity: 8-22 mins   PT G Codes:        DRoyetta Asal PT, DPT 10/16/17, 4:16 PM

## 2017-10-16 NOTE — Progress Notes (Signed)
[place] at [place] NAME: [name]    MR#:  [id number]  DATE OF BIRTH:  1934/10/04  SUBJECTIVE:  patient doing well postop day # 3. She tolerated clear liquid diet. Some abdominal pain at surgical site. Daughter in the room.  REVIEW OF SYSTEMS:   Review of Systems  Constitutional: Negative for chills, fever and weight loss.  HENT: Negative for ear discharge, ear pain and nosebleeds.   Eyes: Negative for blurred vision, pain and discharge.  Respiratory: Negative for sputum production, shortness of breath, wheezing and stridor.   Cardiovascular: Negative for chest pain, palpitations, orthopnea and PND.  Gastrointestinal: Positive for abdominal pain. Negative for diarrhea, nausea and vomiting.  Genitourinary: Negative for frequency and urgency.  Musculoskeletal: Negative for back pain and joint pain.  Neurological: Negative for sensory change, speech change, focal weakness and weakness.  Psychiatric/Behavioral: Negative for depression and hallucinations. The patient is not nervous/anxious.    Tolerating Diet: clear liquid Tolerating PT: ambulatory  DRUG ALLERGIES:   Allergies  Allergen Reactions  . Benzalkonium Chloride Dermatitis    Other reaction(s): Unknown  . Escitalopram     Deveioped a twitch  . Neomycin-Bacitracin Zn-Polymyx Dermatitis    Other reaction(s): Other (See Comments) REACTION: blisters skin REACTION: blisters skin REACTION: blisters skin    VITALS:  Blood pressure (!) 146/55, pulse 87, temperature 98.2 F (36.8 C), temperature source Oral, resp. rate 16, height 5\' 5"  (1.651 m), weight 67.7 kg (149 lb 4.8 oz), SpO2 98 %.  PHYSICAL EXAMINATION:   Physical Exam  GENERAL:  83 y.o.-year-old patient lying in the bed with no acute distress.  EYES: Pupils equal, round, reactive to light and accommodation. No scleral icterus. Extraocular muscles intact.  HEENT: Head atraumatic, normocephalic.  Oropharynx and nasopharynx clear.  NECK:  Supple, no jugular venous distention. No thyroid enlargement, no tenderness.  LUNGS: Normal breath sounds bilaterally, no wheezing, rales, rhonchi. No use of accessory muscles of respiration.  CARDIOVASCULAR: S1, S2 normal. No murmurs, rubs, or gallops.  ABDOMEN: Soft, nontender, nondistended. Bowel sounds present. No organomegaly or mass. Colostomy+, dressing present EXTREMITIES: No cyanosis, clubbing or edema b/l.    NEUROLOGIC: Cranial nerves II through XII are intact. No focal Motor or sensory deficits b/l.   PSYCHIATRIC:  patient is alert and oriented x 3.  SKIN: No obvious rash, lesion, or ulcer.   LABORATORY PANEL:  CBC Recent Labs  Lab 10/14/17 0428  WBC 8.1  HGB 11.9*  HCT 35.7  PLT 265    Chemistries  Recent Labs  Lab 10/14/17 0428  NA 140  K 4.1  CL 111  CO2 23  GLUCOSE 189*  BUN 11  CREATININE 0.98  CALCIUM 7.7*  AST 48*  ALT 23  ALKPHOS 71  BILITOT 0.6   Cardiac Enzymes Recent Labs  Lab 10/11/17 0949  TROPONINI <0.03   RADIOLOGY:  No results found. ASSESSMENT AND PLAN:   [name]  is a 83 y.o. female with a known history per below which also includes chronic neutropenia followed by oncology, history of diverticulitis-2 episodes of exacerbation within the last year, chronic temporal arteritis on chronic steroids for 17 years, presents from [facility] to the emergency room for generalized weakness, fatigue, left-sided abdominal pain, ill feeling since Friday last week  *Acute recurrent sigmoid diverticulitis with multiple associated abscesses - empiric Zosyn IV -POD# 3 s/p  Sigmoind and  Left colon resection with colostomy (Hartmann's procedure) with Splenic flexure takedown -  pain protocol prn -now on FLD  *mild neutropenia White count is now normal and may be due to ongoing infection  *Chronic GERD without esophagitis Stable PPI daily  * temporal arthritis. Patient is on low dose  prednisone. She follows with Dr. Jefm Bryant.  Ambulate patient.  D/w dr Peyton Najjar and pt's dter  Case discussed with Care Management/Social Worker. Management plans discussed with the patient, family and they are in agreement.  CODE STATUS: FULL  DVT Prophylaxis: lovenox after surgery  TOTAL TIME TAKING CARE OF THIS PATIENT: *30* minutes.  >50% time spent on counselling and coordination of care  POSSIBLE D/C IN *few* DAYS, DEPENDING ON CLINICAL CONDITION.  Note: This dictation was prepared with Dragon dictation along with smaller phrase technology. Any transcriptional errors that result from this process are unintentional.  Fritzi Mandes M.D on 10/16/2017 at 3:36 PM  Between 7am to 6pm - Pager - [phone]  After 6pm go to [email or web address] - password [email or web address] [facility]  Office  [phone]  CC: Primary care physician; Baxter Hire, MDPatient ID: [name], female   DOB: Jul 03, 1934, 83 y.o.   MRN: [id number]

## 2017-10-16 NOTE — Care Management Important Message (Signed)
Copy of signed IM left in patient's room.    

## 2017-10-16 NOTE — Progress Notes (Signed)
Patient ID: [name], female   DOB: 24-Aug-1934, 83 y.o.   MRN: [id number]     [place] Hospital Day(s): 5.   Post op day(s): 3 Days Post-Op.   Interval History: Patient seen and examined, no acute events or new complaints overnight. Patient reports feeling tired today, denies nausea and vomiting. Refer tolerating clear liquid.   Vital signs in last 24 hours: [min-max] current  Temp:  [97.7 F (36.5 C)-98.3 F (36.8 C)] 97.7 F (36.5 C) (05/21 0534) Pulse Rate:  [75-85] 76 (05/21 0534) Resp:  [14-20] 20 (05/21 0534) BP: (149-151)/(51-60) 149/59 (05/21 0534) SpO2:  [99 %-100 %] 99 % (05/21 0534)     Height: 5\' 5"  (165.1 cm) Weight: 67.7 kg (149 lb 4.8 oz) BMI (Calculated): 24.84   Intake/Output this shift:  Total I/O In: 240 [P.O.:240] Out: 10 [Drains:10]   Physical Exam:  Constitutional: alert, cooperative and no distress  Respiratory: breathing non-labored at rest  Cardiovascular: regular rate and sinus rhythm  Gastrointestinal: soft, mild-tender, and non-distended. Ostomy pink and patent with scant fecal material and air in bag.   Labs:  CBC Latest Ref Rng & Units 10/14/2017 10/12/2017 10/11/2017  WBC 3.6 - 11.0 K/uL 8.1 1.8(L) 3.7  Hemoglobin 12.0 - 16.0 g/dL 11.9(L) 12.5 12.9  Hematocrit 35.0 - 47.0 % 35.7 37.7 37.6  Platelets 150 - 440 K/uL 265 273 309   CMP Latest Ref Rng & Units 10/14/2017 10/12/2017 10/11/2017  Glucose 65 - 99 mg/dL 189(H) 214(H) 100(H)  BUN 6 - 20 mg/dL 11 7 10   Creatinine 0.44 - 1.00 mg/dL 0.98 0.86 0.93  Sodium 135 - 145 mmol/L 140 138 137  Potassium 3.5 - 5.1 mmol/L 4.1 3.7 3.7  Chloride 101 - 111 mmol/L 111 107 103  CO2 22 - 32 mmol/L 23 24 24   Calcium 8.9 - 10.3 mg/dL 7.7(L) 8.2(L) 8.7(L)  Total Protein 6.5 - 8.1 g/dL 5.8(L) - 7.5  Total Bilirubin 0.3 - 1.2 mg/dL 0.6 - 1.1  Alkaline Phos 38 - 126 U/L 71 - 90  AST 15 - 41 U/L 48(H) - 24  ALT 14 - 54 U/L 23 - 11(L)   Imaging studies: No new pertinent imaging  studies  Assessment/Plan:  83 y.o. female 3 Days Post-Op   Acute diverticulitis with abscess status post Hartmans procedure -Recovering properly. Passed gas yesterday and continue passing gasses today with scant fecal materia. Diet progressed to full liquid today to see how she tolerates. Will continue abx therapy. Seropurulent drain, getting more clear.  -Ostomy nurse evaluating the patient. Will teach how to use and take care of ostomy and appliances.  -Will continue with foley catheter due to severe manipulation of the bladder during surgery. Adequate urine output. No blood on urine.  Encourage to ambulate. PT and OT evaluation and recommendations.  Adequate progress.   Arnold Long, MD

## 2017-10-16 NOTE — Consult Note (Addendum)
[place] Nurse ostomy consult note Colostomy surgery performed 5/18; consult requested for [place] today Stoma type/location:  Stoma is red and viable, slightly above skin level, 1 1/4 inches Peristomal assessment: intact skin surrounding Output: Small amt liquid brown stool  Ostomy pouching: 1pc.  Education provided: Demonstrated pouch change using barrier ring to assist with maintaining a seal, and one piece pouch.  Pt asked appropriate questions and participated in pouch application.  She was able to open and close velcro to empty.  Supplies left at the bedside for staff nurse use if leakage occurs.  Will plan to see if family members want to come tomorrow for a teaching session. Enrolled patient in [place] program: No Julien Girt MSN, [place], [place], [place], [place]

## 2017-10-17 LAB — CREATININE, SERUM
Creatinine, Ser: 0.94 mg/dL (ref 0.44–1.00)
GFR calc Af Amer: >60 mL/min (ref >60)
GFR calc non Af Amer: 55 mL/min — ABNORMAL LOW (ref >60)

## 2017-10-17 MED ORDER — FLUCONAZOLE 100 MG PO TABS
200.0000 mg | ORAL_TABLET | Freq: Once | ORAL | Status: AC
  Administered 2017-10-17: 200 mg via ORAL
  Filled 2017-10-17: qty 2

## 2017-10-17 MED ORDER — AMOXICILLIN-POT CLAVULANATE 875-125 MG PO TABS
1.0000 | ORAL_TABLET | Freq: Two times a day (BID) | ORAL | Status: DC
  Administered 2017-10-17 – 2017-10-18 (×3): 1 via ORAL
  Filled 2017-10-17 (×3): qty 1

## 2017-10-17 NOTE — Care Management (Signed)
Patient admitted with Acute diverticulitis with abscess status post Hartmans procedure and creation of ostomy.  Patient lives at home alone.  Daughter lives locally for support. PT has seen patient and recommends home health PT. Patient agreeable to home health services.  Will need RN ant PT.  No medical equipment recommended. Patient states that she does not have a preference of home health agencies. Heads up referral made to [facility] with [place].

## 2017-10-17 NOTE — Progress Notes (Signed)
[place] at [place] NAME: Cristol Engdahl    MR#:  [id number]  DATE OF BIRTH:  1935/01/11  SUBJECTIVE:  patient doing well postop day # 4 She tolerated full liquid diet--. Some abdominal pain at surgical site.  A bit emotional this am---answered all questions REVIEW OF SYSTEMS:   Review of Systems  Constitutional: Negative for chills, fever and weight loss.  HENT: Negative for ear discharge, ear pain and nosebleeds.   Eyes: Negative for blurred vision, pain and discharge.  Respiratory: Negative for sputum production, shortness of breath, wheezing and stridor.   Cardiovascular: Negative for chest pain, palpitations, orthopnea and PND.  Gastrointestinal: Positive for abdominal pain. Negative for diarrhea, nausea and vomiting.  Genitourinary: Negative for frequency and urgency.  Musculoskeletal: Negative for back pain and joint pain.  Neurological: Negative for sensory change, speech change, focal weakness and weakness.  Psychiatric/Behavioral: Negative for depression and hallucinations. The patient is not nervous/anxious.    Tolerating Diet: soft diet Tolerating PT: ambulatory  DRUG ALLERGIES:   Allergies  Allergen Reactions  . Benzalkonium Chloride Dermatitis    Other reaction(s): Unknown  . Escitalopram     Deveioped a twitch  . Neomycin-Bacitracin Zn-Polymyx Dermatitis    Other reaction(s): Other (See Comments) REACTION: blisters skin REACTION: blisters skin REACTION: blisters skin    VITALS:  Blood pressure (!) 156/53, pulse 77, temperature 97.6 F (36.4 C), temperature source Oral, resp. rate 20, height 5\' 5"  (1.651 m), weight 67.7 kg (149 lb 4.8 oz), SpO2 98 %.  PHYSICAL EXAMINATION:   Physical Exam  GENERAL:  83 y.o.-year-old patient lying in the bed with no acute distress.  EYES: Pupils equal, round, reactive to light and accommodation. No scleral icterus. Extraocular muscles intact.  HEENT: Head atraumatic,  normocephalic. Oropharynx and nasopharynx clear.  NECK:  Supple, no jugular venous distention. No thyroid enlargement, no tenderness.  LUNGS: Normal breath sounds bilaterally, no wheezing, rales, rhonchi. No use of accessory muscles of respiration.  CARDIOVASCULAR: S1, S2 normal. No murmurs, rubs, or gallops.  ABDOMEN: Soft, nontender, nondistended. Bowel sounds present. No organomegaly or mass. Colostomy+, dressing present, drain+ EXTREMITIES: No cyanosis, clubbing or edema b/l.    NEUROLOGIC: Cranial nerves II through XII are intact. No focal Motor or sensory deficits b/l.   PSYCHIATRIC:  patient is alert and oriented x 3.  SKIN: No obvious rash, lesion, or ulcer.   LABORATORY PANEL:  CBC Recent Labs  Lab 10/14/17 0428  WBC 8.1  HGB 11.9*  HCT 35.7  PLT 265    Chemistries  Recent Labs  Lab 10/14/17 0428 10/17/17 1000  NA 140  --   K 4.1  --   CL 111  --   CO2 23  --   GLUCOSE 189*  --   BUN 11  --   CREATININE 0.98 0.94  CALCIUM 7.7*  --   AST 48*  --   ALT 23  --   ALKPHOS 71  --   BILITOT 0.6  --    Cardiac Enzymes Recent Labs  Lab 10/11/17 0949  TROPONINI <0.03   RADIOLOGY:  No results found. ASSESSMENT AND PLAN:   Evarose Altland  is a 83 y.o. female with a known history per below which also includes chronic neutropenia followed by oncology, history of diverticulitis-2 episodes of exacerbation within the last year, chronic temporal arteritis on chronic steroids for 17 years, presents from [facility] to the emergency room for generalized weakness, fatigue, left-sided  abdominal pain, ill feeling since Friday last week  *Acute recurrent sigmoid diverticulitis with multiple associated abscesses - empiric Zosyn IV -POD# 4 s/p  Sigmoind and  Left colon resection with colostomy (Hartmann's procedure) with Splenic flexure takedown - pain protocol prn -now on FLD---today soft diet  *mild neutropenia White count is now normal and may be due to ongoing  infection  *Chronic GERD without esophagitis Stable PPI daily  * temporal arthritis. Patient is on low dose prednisone. She follows with Dr. Jefm Bryant.  Ambulate patient.--pt recommends HHPT  D/w dr Peyton Najjar and pt'  Case discussed with Care Management/Social Worker. Management plans discussed with the patient, family and they are in agreement.  CODE STATUS: FULL  DVT Prophylaxis: lovenox after surgery  TOTAL TIME TAKING CARE OF THIS PATIENT: *30* minutes.  >50% time spent on counselling and coordination of care  POSSIBLE D/C IN AM DEPENDING ON CLINICAL CONDITION.  Note: This dictation was prepared with Dragon dictation along with smaller phrase technology. Any transcriptional errors that result from this process are unintentional.  Fritzi Mandes M.D on 10/17/2017 at 12:08 PM  Between 7am to 6pm - Pager - [phone]  After 6pm go to [email or web address] - password [email or web address] [facility]  Office  [phone]  CC: Primary care physician; Baxter Hire, MDPatient ID: [name], female   DOB: 05-21-1935, 83 y.o.   MRN: [id number]

## 2017-10-17 NOTE — Consult Note (Addendum)
[place] Nurse ostomy consult note Colostomy surgery performed 5/18  Stoma type/location:  Stoma is red and viable, slightly above skin level, 1 1/4 inches Peristomal assessment: intact skin surrounding Output: Small amt liquid brown stool  Ostomy pouching: 2 pc.  Education provided: Demonstrated pouch change using barrier ring to assist with maintaining a seal, and two piece pouching system.  Pt asked appropriate questions and assisted with pouch application.  She was able to open and close velcro to empty.  4 sets of supplies left at the bedside with educational materials.  Daughter present for teaching session. Recommend home health for additional assistance after discharge. Enrolled patient in [place] program: Yes Julien Girt MSN, RN, [place], [place], [place]

## 2017-10-18 LAB — AEROBIC/ANAEROBIC CULTURE W GRAM STAIN (SURGICAL/DEEP WOUND)

## 2017-10-18 LAB — AEROBIC/ANAEROBIC CULTURE (SURGICAL/DEEP WOUND)

## 2017-10-18 MED ORDER — ADULT MULTIVITAMIN W/MINERALS CH: 1.0000 | ORAL_TABLET | Freq: Every day | ORAL | 0 refills | Status: DC

## 2017-10-18 NOTE — Progress Notes (Signed)
Physical Therapy Treatment Patient Details Name: [name] MRN: [id number] DOB: March 11, 1935 Today's Date: 10/18/2017    History of Present Illness Pt is an 83 y.o. female with a known history including chronic neutropenia followed by oncology, history of diverticulitis-2 episodes of exacerbation within the last year, chronic temporal arteritis on chronic steroids for 17 years, presents from [facility] to the emergency room for Reiger's, generalized weakness, fatigue, left-sided abdominal pain, recently started on Cipro for UTI.  In the emergency room urinalysis noted for ketones/trace leukocyte esterase, CT abdomen noted for sigmoid diverticulitis with 3 new abscesses, ED attending discussed the case with the patient's general surgeon/Dr. Ferrel Logan who recommended hospitalist admission with surgical consultation, patient evaluated in the emergency room, daughter at the bedside, patient now be admitted for acute recurrent sigmoid diverticulitis with intraperitoneal abscess and peritonitis.  Pt now s/p sigmoid and  left colon resection with colostomy (Hartmann's procedure) and splenic flexure takedown.    PT Comments    Pt anticipating discharge home today.  Has been ambulating to/from bathroom without assist and demonstrated on during session.  Pt was able to ambulate around nursing unit this am without AD and supervision.  She did have one minor LOB at 150 but she was able to recover with min guard.  Pt stated she has a RW at home.  Pt was encouraged to use walker for outside ambulation or in community until strength increases.  Overall she did well and voices no further concerns for therapy.   Follow Up Recommendations  Home health PT;Supervision for mobility/OOB     Equipment Recommendations  None recommended by PT    Recommendations for Other Services       Precautions / Restrictions Precautions Precautions: Fall Restrictions Weight Bearing Restrictions: No    Mobility  Bed  Mobility Overal bed mobility: Modified Independent                Transfers Overall transfer level: Modified independent                  Ambulation/Gait Ambulation/Gait assistance: Modified independent (Device/Increase time);Min guard Ambulation Distance (Feet): 200 Feet Assistive device: None Gait Pattern/deviations: Step-through pattern Gait velocity: Decreased       Stairs             Wheelchair Mobility    Modified Rankin (Stroke Patients Only)       Balance Overall balance assessment: Mild deficits observed, not formally tested                                          Cognition Arousal/Alertness: Awake/alert Behavior During Therapy: WFL for tasks assessed/performed Overall Cognitive Status: Within Functional Limits for tasks assessed                                        Exercises      General Comments General comments (skin integrity, edema, etc.): does well short distances.  Once LOB recovered with min guard in hallway at 130'      Pertinent Vitals/Pain Pain Assessment: No/denies pain    Home Living                      Prior Function  PT Goals (current goals can now be found in the care plan section) Progress towards PT goals: Progressing toward goals    Frequency    Min 2X/week      PT Plan Current plan remains appropriate    Co-evaluation              AM-PAC PT "6 Clicks" Daily Activity  Outcome Measure  Difficulty turning over in bed (including adjusting bedclothes, sheets and blankets)?: None Difficulty moving from lying on back to sitting on the side of the bed? : None Difficulty sitting down on and standing up from a chair with arms (e.g., wheelchair, bedside commode, etc,.)?: None Help needed moving to and from a bed to chair (including a wheelchair)?: None Help needed walking in hospital room?: A Little Help needed climbing 3-5 steps with a  railing? : A Little 6 Click Score: 22    End of Session Equipment Utilized During Treatment: Gait belt Activity Tolerance: Patient tolerated treatment well Patient left: in bed;with call bell/phone within reach         Time: 1013-1021 PT Time Calculation (min) (ACUTE ONLY): 8 min  Charges:  $Gait Training: 8-22 mins                    G Codes:      Chesley Noon, PTA 10/18/17, 10:25 AM

## 2017-10-18 NOTE — Progress Notes (Signed)
Patient discharge teaching given, including activity, diet, follow-up appoints, and medications. Patient verbalized understanding of all discharge instructions. IV access was d/c'd. Vitals are stable. Skin is intact except as charted in most recent assessments. Measuring cup for ostomy, paper pads, wipes, and gloves were given to pt to help her when she empties and changes her ostomy bag. Pt to be escorted out by volunteer, to be driven home by family.  Graydon Fofana [employer]

## 2017-10-18 NOTE — Progress Notes (Signed)
Patient ID: [name], female   DOB: 1934-11-23, 83 y.o.   MRN: [id number]     [place] Hospital Day(s): 7.   Post op day(s): 5 Days Post-Op.   Interval History: Patient seen and examined, no acute events or new complaints overnight. Patient reports tolerated soft diet yesterday. Patient eating small amounts frequent meals. Denies nausea or vomiting. Refers has an episode of urinary incontinence. Patient oriented about doing pelvic floor excercises.  Vital signs in last 24 hours: [min-max] current  Temp:  [97.5 F (36.4 C)-98.1 F (36.7 C)] 97.5 F (36.4 C) (05/23 0453) Pulse Rate:  [77-91] 91 (05/23 0453) Resp:  [18] 18 (05/23 0453) BP: (152-160)/(53-62) 152/56 (05/23 0453) SpO2:  [97 %-98 %] 97 % (05/23 0453)     Height: 5\' 5"  (165.1 cm) Weight: 67.7 kg (149 lb 4.8 oz) BMI (Calculated): 24.84    Physical Exam:  Constitutional: alert, cooperative and no distress  HENT: normocephalic without obvious abnormality  Eyes: PERRL, EOM's grossly intact and symmetric  Respiratory: breathing non-labored at rest  Cardiovascular: regular rate and sinus rhythm  Gastrointestinal: soft, non-tender, and non-distended. Wound dry and clean. Ostomy pink and patent with adequate stool on bag. JP removed today.   Labs:  CBC Latest Ref Rng & Units 10/14/2017 10/12/2017 10/11/2017  WBC 3.6 - 11.0 K/uL 8.1 1.8(L) 3.7  Hemoglobin 12.0 - 16.0 g/dL 11.9(L) 12.5 12.9  Hematocrit 35.0 - 47.0 % 35.7 37.7 37.6  Platelets 150 - 440 K/uL 265 273 309   CMP Latest Ref Rng & Units 10/17/2017 10/14/2017 10/12/2017  Glucose 65 - 99 mg/dL - 189(H) 214(H)  BUN 6 - 20 mg/dL - 11 7  Creatinine 0.44 - 1.00 mg/dL 0.94 0.98 0.86  Sodium 135 - 145 mmol/L - 140 138  Potassium 3.5 - 5.1 mmol/L - 4.1 3.7  Chloride 101 - 111 mmol/L - 111 107  CO2 22 - 32 mmol/L - 23 24  Calcium 8.9 - 10.3 mg/dL - 7.7(L) 8.2(L)  Total Protein 6.5 - 8.1 g/dL - 5.8(L) -  Total Bilirubin 0.3 - 1.2 mg/dL - 0.6 -  Alkaline  Phos 38 - 126 U/L - 71 -  AST 15 - 41 U/L - 48(H) -  ALT 14 - 54 U/L - 23 -   Imaging studies: No new pertinent imaging studies   Assessment/Plan:  83 y.o. female 5 Days Post-Op   Acute diverticulitis with abscess status post Hartmans procedure -Recovering properly. Adequate stool on colostomy bag. Tolerated soft diet yesterday. JP removed. Pain under control.  -Patient trained on how to use the ostomy bag.  -Foley catheter removed yesterday, had incontinence which is expected. Oriented to do pelvic floor exercises.  -Ambulating with help. PT and OT to be continued at home.  Adequate progress. -From surgical standpoint may be discharged if medically stable.   Arnold Long, MD

## 2017-10-18 NOTE — Discharge Summary (Signed)
[facility] NAME: [name]    MR#:  [id number]  DATE OF BIRTH:  20-Mar-1935  DATE OF ADMISSION:  10/11/2017 ADMITTING PHYSICIAN: Gorden Harms, MD  DATE OF DISCHARGE:10/18/2017  PRIMARY CARE PHYSICIAN: Baxter Hire, MD    ADMISSION DIAGNOSIS:  Pericolonic abscess due to diverticulitis [K57.20]  DISCHARGE DIAGNOSIS:  Diverticular abscess, recurrent s/p Sigmoidectomy and colostomy   SECONDARY DIAGNOSIS:   Past Medical History:  Diagnosis Date  . Breast cancer ([place])    1985  (bilateral mastectomy )  . Depression   . Diverticulosis   . GERD (gastroesophageal reflux disease)   . Hiatal hernia   . History of Bell's palsy   . History of colon polyps   . History of nephrolithiasis   . History of pancreatitis   . Hyperlipidemia   . IBS (irritable bowel syndrome)   . Osteoporosis   . Skin cancer   . Temporal arteritis ([place])   . Tubular adenoma of colon     HOSPITAL COURSE:   [name] a83 y.o.femalewith a known history per below which also includes chronic neutropenia followed by oncology, history of diverticulitis-2 episodes of exacerbation within the last year, chronic temporal arteritis on chronic steroids for 17 years, presentsfrom [facility] to the emergency room for generalized weakness, fatigue, left-sided abdominal pain, ill feeling since Friday last week  *Acute recurrent sigmoid diverticulitis with multiple associated abscesses - empiric Zosyn IV--augmentin ( completed course) -POD# 5 s/p  Sigmoind andLeft colon resection with colostomy (Hartmann's procedure) with Splenic flexure takedown - pain protocol prn -now on FLD---tolerates soft diet  *mild neutropenia Wbc 8.1  *Chronic GERD without esophagitis Stable PPI daily  * temporal arthritis. Patient is on low dose prednisone. She follows with Dr. Jefm Bryant.  Ambulate patient.--pt recommends HHPT D/c home.  [place] with surgery.   CONSULTS OBTAINED:  Treatment Team:  Herbert Pun, MD  DRUG ALLERGIES:   Allergies  Allergen Reactions  . Benzalkonium Chloride Dermatitis    Other reaction(s): Unknown  . Escitalopram     Deveioped a twitch  . Neomycin-Bacitracin Zn-Polymyx Dermatitis    Other reaction(s): Other (See Comments) REACTION: blisters skin REACTION: blisters skin REACTION: blisters skin    DISCHARGE MEDICATIONS:   Allergies as of 10/18/2017      Reactions   Benzalkonium Chloride Dermatitis   Other reaction(s): Unknown   Escitalopram    Deveioped a twitch   Neomycin-bacitracin Zn-polymyx Dermatitis   Other reaction(s): Other (See Comments) REACTION: blisters skin REACTION: blisters skin REACTION: blisters skin      Medication List    STOP taking these medications   estradiol 1 MG tablet Commonly known as:  ESTRACE   medroxyPROGESTERone 2.5 MG tablet Commonly known as:  PROVERA     TAKE these medications   ALPRAZolam 0.25 MG tablet Commonly known as:  XANAX Take 0.25 mg by mouth 2 (two) times daily as needed.   aspirin EC 81 MG tablet Take 81 mg daily by mouth.   B-12 PO Take 2,500 mcg daily by mouth.   Biotin 1000 MCG tablet Take 1,000 mcg by mouth daily.   feeding supplement (ENSURE ENLIVE) Liqd Take 237 mLs by mouth 2 (two) times daily between meals.   fluticasone 50 MCG/ACT nasal spray Commonly known as:  FLONASE Place 1 spray into the nose daily.   multivitamin with minerals Tabs tablet Take 1 tablet by mouth daily.   predniSONE 5 MG tablet Commonly known as:  DELTASONE Take 2.5 mg by mouth daily.   traMADol 50 MG tablet Commonly known as:  ULTRAM Take 50 mg by mouth 2 (two) times daily as needed for moderate pain.   Vitamin D3 2000 units capsule Take 2,000 Units daily by mouth.       If you experience worsening of your admission symptoms, develop shortness of breath, life threatening emergency, suicidal or homicidal  thoughts you must seek medical attention immediately by calling 911 or calling your MD immediately  if symptoms less severe.  You Must read complete instructions/literature along with all the possible adverse reactions/side effects for all the Medicines you take and that have been prescribed to you. Take any new Medicines after you have completely understood and accept all the possible adverse reactions/side effects.   Please note  You were cared for by a hospitalist during your hospital stay. If you have any questions about your discharge medications or the care you received while you were in the hospital after you are discharged, you can call the unit and asked to speak with the hospitalist on call if the hospitalist that took care of you is not available. Once you are discharged, your primary care physician will handle any further medical issues. Please note that NO REFILLS for any discharge medications will be authorized once you are discharged, as it is imperative that you return to your primary care physician (or establish a relationship with a primary care physician if you do not have one) for your aftercare needs so that they can reassess your need for medications and monitor your lab values. Today   SUBJECTIVE   Doing well  VITAL SIGNS:  Blood pressure (!) 152/56, pulse 91, temperature (!) 97.5 F (36.4 C), temperature source Oral, resp. rate 18, height 5\' 5"  (1.651 m), weight 67.7 kg (149 lb 4.8 oz), SpO2 97 %.  I/O:    Intake/Output Summary (Last 24 hours) at 10/18/2017 1025 Last data filed at 10/18/2017 0452 Gross per 24 hour  Intake 80 ml  Output 595 ml  Net -515 ml    PHYSICAL EXAMINATION:  GENERAL:  83 y.o.-year-old patient lying in the bed with no acute distress.  EYES: Pupils equal, round, reactive to light and accommodation. No scleral icterus. Extraocular muscles intact.  HEENT: Head atraumatic, normocephalic. Oropharynx and nasopharynx clear.  NECK:  Supple, no  jugular venous distention. No thyroid enlargement, no tenderness.  LUNGS: Normal breath sounds bilaterally, no wheezing, rales,rhonchi or crepitation. No use of accessory muscles of respiration.  CARDIOVASCULAR: S1, S2 normal. No murmurs, rubs, or gallops.  ABDOMEN: Soft, non-tender, non-distended. Bowel sounds present. Colostomy Left Lower quadrant.  EXTREMITIES: No pedal edema, cyanosis, or clubbing.  NEUROLOGIC: Cranial nerves II through XII are intact. Muscle strength 5/5 in all extremities. Sensation intact. Gait not checked.  PSYCHIATRIC: The patient is alert and oriented x 3.  SKIN: No obvious rash, lesion, or ulcer.   DATA REVIEW:   CBC  Recent Labs  Lab 10/14/17 0428  WBC 8.1  HGB 11.9*  HCT 35.7  PLT 265    Chemistries  Recent Labs  Lab 10/14/17 0428 10/17/17 1000  NA 140  --   K 4.1  --   CL 111  --   CO2 23  --   GLUCOSE 189*  --   BUN 11  --   CREATININE 0.98 0.94  CALCIUM 7.7*  --   AST 48*  --   ALT 23  --   ALKPHOS 71  --  BILITOT 0.6  --     Microbiology Results   Recent Results (from the past 240 hour(s))  Blood Culture (routine x 2)     Status: None   Collection Time: 10/11/17  9:49 AM  Result Value Ref Range Status   Specimen Description BLOOD RIGHT [facility]  Final   Special Requests   Final    BOTTLES DRAWN AEROBIC AND ANAEROBIC Blood Culture adequate volume   Culture   Final    NO GROWTH 5 DAYS Performed at [facility], [address] [place], [place] [address]    Report Status 10/16/2017 FINAL  Final  Blood Culture (routine x 2)     Status: None   Collection Time: 10/11/17  9:49 AM  Result Value Ref Range Status   Specimen Description BLOOD LEFT [facility]  Final   Special Requests   Final    BOTTLES DRAWN AEROBIC AND ANAEROBIC Blood Culture adequate volume   Culture   Final    NO GROWTH 5 DAYS Performed at [facility], [address], [place], [place] [address]    Report Status 10/16/2017 FINAL  Final  Urine culture      Status: None   Collection Time: 10/11/17  9:49 AM  Result Value Ref Range Status   Specimen Description   Final    URINE, RANDOM Performed at [facility], [address], [place], [place] [address]    Special Requests   Final    NONE Performed at [facility], [address] [place], [place] [address]    Culture   Final    NO GROWTH Performed at [facility], [place] [address], [place], [place] [address]    Report Status 10/12/2017 FINAL  Final  Surgical PCR screen     Status: None   Collection Time: 10/12/17  9:32 PM  Result Value Ref Range Status   MRSA, PCR NEGATIVE NEGATIVE Final   Staphylococcus aureus NEGATIVE NEGATIVE Final    Comment: (NOTE) The Xpert SA Assay (FDA approved for NASAL specimens in patients 41 years of age and older), is one component of a comprehensive surveillance program. It is not intended to diagnose infection nor to guide or monitor treatment. Performed at [facility], [address], [place], [place] [address]   Aerobic/Anaerobic Culture (surgical/deep wound)     Status: None (Preliminary result)   Collection Time: 10/13/17  1:38 PM  Result Value Ref Range Status   Specimen Description   Final    WOUND Performed at [facility], [address], [place], [place] [address]    Special Requests   Final    diverticular abscess Performed at [facility], [address] [place],  [address]    Gram Stain   Final    ABUNDANT WBC PRESENT, PREDOMINANTLY PMN NO ORGANISMS SEEN    Culture   Final    RARE CANDIDA ALBICANS FEW BACTEROIDES VULGATUS BETA LACTAMASE POSITIVE Performed at [facility], [place] [address], [place],  [address]    Report Status PENDING  Incomplete    RADIOLOGY:  No results found.   Management plans discussed with the patient, family and they are in agreement.  CODE STATUS:     Code Status Orders  (From admission, onward)        Start      Ordered   10/11/17 1308  Full code  Continuous     10/11/17 1307    Code Status History  Date Active Date Inactive Code Status Order ID Comments User Context   08/02/2017 1750 08/04/2017 1825 Full Code [id number]  Vaughan Basta, MD Inpatient   06/14/2017 1937 06/17/2017 1812 Full Code [id number]  Dustin Flock, MD ED    Advance Directive Documentation     Most Recent Value  Type of Advance Directive  Healthcare Power of Attorney, Living will  Pre-existing out of facility DNR order (yellow form or pink MOST form)  -  "MOST" Form in Place?  -      TOTAL TIME TAKING CARE OF THIS PATIENT: *40* minutes.    Fritzi Mandes M.D on 10/18/2017 at 10:25 AM  Between 7am to 6pm - Pager - [phone] After 6pm go to [email or web address] - password [email or web address] [facility]  Office  [phone]  CC: Primary care physician; Baxter Hire, MD

## 2017-10-18 NOTE — Progress Notes (Addendum)
Nutrition Follow Up Note   DOCUMENTATION CODES:   Not applicable  INTERVENTION:   Continue Ensure Enlive po BID, each supplement provides 350 kcal and 20 grams of protein  MVI daily   Recommend check K, Mg, and P labs as pt at moderate risk for refeeding  NUTRITION DIAGNOSIS:   Inadequate oral intake related to acute illness as evidenced by NPO status. - pt tolerating soft diet   GOAL:   Patient will meet greater than or equal to 90% of their needs  - progressing   MONITOR:   PO intake, Supplement acceptance, Diet advancement, Labs, Weight trends, Skin, I & O's  ASSESSMENT:   83 y.o. female with complicated diverticulitis, complicated by pertinent comorbidities including urinary tract infection, chronic neutropenia, chronic use of oral systemic steroid, suspected adrenal insufficiency.   Pt s/p sigmoind andleft colon resection with colostomy (Hartmann's procedure) with Splenic flexure takedown 5/18  Pt advanced to soft diet 5/22; pt eating small frequent meals and drinking Ensure. No new weight since 5/17; will request new weight. Pt +8.5L since admit. Pt passing flatus and small amounts of stool in her ostomy bag. JP drain removed. Continue supplements and MVI. Recommend check K, Mg, and P labs as pt at moderate risk for refeeding  Medications reviewed and include: augmentin, lovenox, pepcid, MVI, prednisone  Labs reviewed:   Diet Order:   Diet Order           DIET SOFT Room service appropriate? Yes; Fluid consistency: Thin  Diet effective now         EDUCATION NEEDS:   Education needs have been addressed  Skin:  Skin Assessment: Reviewed RN Assessment(Ecchymosis, abdominal incision )  Last BM:  5/23- colostomy   Height:   Ht Readings from Last 1 Encounters:  10/11/17 5\' 5"  (1.651 m)    Weight:   Wt Readings from Last 1 Encounters:  10/12/17 149 lb 4.8 oz (67.7 kg)    Ideal Body Weight:  56.8 kg  BMI:  Body mass index is 24.84 kg/m.  Estimated  Nutritional Needs:   Kcal:  1400-1600kcal/day   Protein:  75-88g/day   Fluid:  >1.4L/day   Koleen Distance MS, RD, LDN Pager #- [phone] Office#- [phone] After Hours Pager: [phone]

## 2017-10-18 NOTE — Care Management Important Message (Signed)
Copy of signed IM left in patient's room.    

## 2017-10-18 NOTE — Discharge Instructions (Signed)
Colostomy care per protocol

## 2017-10-18 NOTE — Care Management Note (Signed)
Case Management Note  Patient Details  Name: POLA FURNO MRN: [id number] Date of Birth: 01/28/1935   Corene Cornea with [facility] Notified of discharge.   Subjective/Objective:                    Action/Plan:   Expected Discharge Date:  10/18/17               Expected Discharge Plan:  [place]  In-House Referral:     Discharge planning Services  CM Consult  Post Acute Care Choice:  Home Health Choice offered to:  Patient  DME Arranged:    DME Agency:     HH Arranged:  RN, PT [place] Agency:  [facility]  Status of Service:  Completed, signed off  If discussed at [place] of Stay Meetings, dates discussed:    Additional Comments:  Beverly Sessions, RN 10/18/2017, 2:39 PM

## 2017-10-19 LAB — BLOOD GAS, VENOUS
ACID-BASE EXCESS: 1.2 mmol/L (ref 0.0–2.0)
BICARBONATE: 26 mmol/L (ref 20.0–28.0)
PATIENT TEMPERATURE: 37
pCO2, Ven: 41 mmHg — ABNORMAL LOW (ref 44.0–60.0)
pH, Ven: 7.41 (ref 7.250–7.430)

## 2017-12-18 ENCOUNTER — Encounter: Payer: Self-pay | Admitting: Hematology and Oncology

## 2018-02-04 ENCOUNTER — Inpatient Hospital Stay: Payer: Medicare Other | Admitting: Hematology and Oncology

## 2018-02-04 ENCOUNTER — Other Ambulatory Visit: Payer: Self-pay | Admitting: Hematology and Oncology

## 2018-02-04 ENCOUNTER — Inpatient Hospital Stay: Payer: Medicare Other | Attending: Hematology and Oncology

## 2018-02-04 ENCOUNTER — Encounter: Payer: Self-pay | Admitting: Hematology and Oncology

## 2018-02-04 VITALS — BP 164/80 | HR 97 | Temp 97.0°F | Resp 18 | Wt 150.4 lb

## 2018-02-04 DIAGNOSIS — D72819 Decreased white blood cell count, unspecified: Secondary | ICD-10-CM

## 2018-02-04 DIAGNOSIS — Z853 Personal history of malignant neoplasm of breast: Secondary | ICD-10-CM | POA: Diagnosis not present

## 2018-02-04 DIAGNOSIS — D709 Neutropenia, unspecified: Secondary | ICD-10-CM

## 2018-02-04 DIAGNOSIS — Z79899 Other long term (current) drug therapy: Secondary | ICD-10-CM | POA: Diagnosis not present

## 2018-02-04 DIAGNOSIS — R531 Weakness: Secondary | ICD-10-CM | POA: Diagnosis not present

## 2018-02-04 DIAGNOSIS — Z7982 Long term (current) use of aspirin: Secondary | ICD-10-CM

## 2018-02-04 DIAGNOSIS — Z85828 Personal history of other malignant neoplasm of skin: Secondary | ICD-10-CM | POA: Insufficient documentation

## 2018-02-04 DIAGNOSIS — Z7952 Long term (current) use of systemic steroids: Secondary | ICD-10-CM

## 2018-02-04 DIAGNOSIS — Z87891 Personal history of nicotine dependence: Secondary | ICD-10-CM | POA: Diagnosis not present

## 2018-02-04 DIAGNOSIS — Z800 Family history of malignant neoplasm of digestive organs: Secondary | ICD-10-CM | POA: Insufficient documentation

## 2018-02-04 DIAGNOSIS — M316 Other giant cell arteritis: Secondary | ICD-10-CM | POA: Insufficient documentation

## 2018-02-04 DIAGNOSIS — D708 Other neutropenia: Secondary | ICD-10-CM

## 2018-02-04 LAB — CBC WITH DIFFERENTIAL/PLATELET
Basophils Absolute: 0 10*3/uL (ref 0–0.1)
Basophils Relative: 1 %
Eosinophils Absolute: 0 10*3/uL (ref 0–0.7)
Eosinophils Relative: 2 %
HCT: 39.4 % (ref 35.0–47.0)
Hemoglobin: 12.8 g/dL (ref 12.0–16.0)
Lymphocytes Relative: 47 %
Lymphs Abs: 1 10*3/uL (ref 1.0–3.6)
MCH: 28.9 pg (ref 26.0–34.0)
MCHC: 32.5 g/dL (ref 32.0–36.0)
MCV: 89 fL (ref 80.0–100.0)
Monocytes Absolute: 0.2 10*3/uL (ref 0.2–0.9)
Monocytes Relative: 10 %
Neutro Abs: 0.9 10*3/uL — ABNORMAL LOW (ref 1.4–6.5)
Neutrophils Relative %: 40 %
Platelets: 257 10*3/uL (ref 150–440)
RBC: 4.43 MIL/uL (ref 3.80–5.20)
RDW: 14.6 % — ABNORMAL HIGH (ref 11.5–14.5)
WBC: 2.2 10*3/uL — ABNORMAL LOW (ref 3.6–11.0)

## 2018-02-04 NOTE — Progress Notes (Signed)
Patient states she had to have an emergency colostomy in May.  Patient states she had abceses in her stomach that went into her colon.  She is doing well.  Having some problems with accepting the colostomy.  States she is very fortunate and contributes Dr. Ferrel Logan with saving her life.

## 2018-02-04 NOTE — Patient Instructions (Signed)
Filgrastim, G-CSF injection  What is this medicine?  FILGRASTIM, G-CSF (fil GRA stim) is a granulocyte colony-stimulating factor that stimulates the growth of neutrophils, a type of white blood cell (WBC) important in the body's fight against infection. It is used to reduce the incidence of fever and infection in patients with certain types of cancer who are receiving chemotherapy that affects the bone marrow, to stimulate blood cell production for removal of WBCs from the body prior to a bone marrow transplantation, to reduce the incidence of fever and infection in patients who have severe chronic neutropenia, and to improve survival outcomes following high-dose radiation exposure that is toxic to the bone marrow.  This medicine may be used for other purposes; ask your health care provider or pharmacist if you have questions.  COMMON BRAND NAME(S): Neupogen, Zarxio  What should I tell my health care provider before I take this medicine?  They need to know if you have any of these conditions:  -kidney disease  -latex allergy  -ongoing radiation therapy  -sickle cell disease  -an unusual or allergic reaction to filgrastim, pegfilgrastim, other medicines, foods, dyes, or preservatives  -pregnant or trying to get pregnant  -breast-feeding  How should I use this medicine?  This medicine is for injection under the skin or infusion into a vein. As an infusion into a vein, it is usually given by a health care professional in a hospital or clinic setting. If you get this medicine at home, you will be taught how to prepare and give this medicine. Refer to the Instructions for Use that come with your medication packaging. Use exactly as directed. Take your medicine at regular intervals. Do not take your medicine more often than directed.  It is important that you put your used needles and syringes in a special sharps container. Do not put them in a trash can. If you do not have a sharps container, call your pharmacist or  healthcare provider to get one.  Talk to your pediatrician regarding the use of this medicine in children. While this drug may be prescribed for children as young as 7 months for selected conditions, precautions do apply.  Overdosage: If you think you have taken too much of this medicine contact a poison control center or emergency room at once.  NOTE: This medicine is only for you. Do not share this medicine with others.  What if I miss a dose?  It is important not to miss your dose. Call your doctor or health care professional if you miss a dose.  What may interact with this medicine?  This medicine may interact with the following medications:  -medicines that may cause a release of neutrophils, such as lithium  This list may not describe all possible interactions. Give your health care provider a list of all the medicines, herbs, non-prescription drugs, or dietary supplements you use. Also tell them if you smoke, drink alcohol, or use illegal drugs. Some items may interact with your medicine.  What should I watch for while using this medicine?  You may need blood work done while you are taking this medicine.  What side effects may I notice from receiving this medicine?  Side effects that you should report to your doctor or health care professional as soon as possible:  -allergic reactions like skin rash, itching or hives, swelling of the face, lips, or tongue  -dizziness or feeling faint  -fever  -pain, redness, or irritation at site where injected  -pinpoint red spots   on the skin  -shortness of breath or breathing problems  -signs and symptoms of kidney injury like trouble passing urine, change in the amount of urine, or red or dark-brown urine  -stomach or side pain, or pain at the shoulder  -swelling  -tiredness  -  unusual bleeding or bruising  Side effects that usually do not require medical attention (report to your doctor or health care professional if they continue or are bothersome):  -bone  pain  -headache  -muscle pain  This list may not describe all possible side effects. Call your doctor for medical advice about side effects. You may report side effects to FDA at 1-800-FDA-1088.  Where should I keep my medicine?  Keep out of the reach of children.  Store in a refrigerator between 2 and 8 degrees C (36 and 46 degrees F). Do not freeze. Keep in carton to protect from light. Throw away this medicine if vials or syringes are left out of the refrigerator for more than 24 hours. Throw away any unused medicine after the expiration date.  NOTE: This sheet is a summary. It may not cover all possible information. If you have questions about this medicine, talk to your doctor, pharmacist, or health care provider.  © 2018 [employer]vier/Gold Standard (2014-12-02 10:57:13)

## 2018-02-04 NOTE — Progress Notes (Signed)
[facility] day:  02/04/2018   Chief Complaint: [name] is a 83 y.o. female with neutropenia who is seen for 4 month assessment.  HPI:  The patient was last seen in the hematology clinic on 10/08/2017.  At that time, she was currently on ciprofloxacin for a UTI.  She denied fevers, but has chills. She was "wobbly".  Rash had resolved. She had continued LLQ abdominal pain. Weight was down 4 pounds. Exam was stable. WBC was 3600 ([place] 2400).    She was to begin GCSF with follow-up counts.  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  She presented with generalized fatigue and left sided abdominal pain.  Abdomen and pelvic CT revealed 3 pericolonic abscesses associated with diverticulitis of the rectosigmoid colon.  She underwent sigmoid and left colon resection with colostomy (Hartmann's procedure).  She was treated with Zosyn then switched to Augmentin to complete an outpatient course of antibiotics.  While hospitalized WBC was 1800 - 8100.  ANC was 7100.  CBC on 10/25/2017:  Hematocrit 38.8, hemoglobin 11.9, MCV 96.8, platelets 319,000, WBC 6000.  [place] 4470. CBC on 01/22/2018:  Hematocrit 43.7, hemoglobin 13.2, MCV 94.4, platelets 252,000, WBC 2100.  [place] 1050.  She was seen by Dr. Jefm Bryant on 01/31/2018.  Notes reviewed.  Temporal arteritis was quiet.  She was on prednisone 2.5 mg a day.  During the interim, she has done well.  She denies any other infections.  She notes some soreness to the right side of her head.  She may have a cold.  She has clear nasal discharge.   Past Medical History:  Diagnosis Date  . Breast cancer ([place])    1985  (bilateral mastectomy )  . Depression   . Diverticulosis   . GERD (gastroesophageal reflux disease)   . Hiatal hernia   . History of Bell's palsy   . History of colon polyps   . History of nephrolithiasis   . History of pancreatitis   .  Hyperlipidemia   . IBS (irritable bowel syndrome)   . Osteoporosis   . Skin cancer   . Temporal arteritis ([place])   . Tubular adenoma of colon     Past Surgical History:  Procedure Laterality Date  . COLON RESECTION SIGMOID N/A 10/13/2017   Procedure: COLON RESECTION SIGMOID;  Surgeon: Herbert Pun, MD;  Location: [facility] ORS;  Service: General;  Laterality: N/A;  . COLOSTOMY N/A 10/13/2017   Procedure: COLOSTOMY;  Surgeon: Herbert Pun, MD;  Location: [facility] ORS;  Service: General;  Laterality: N/A;  . LAPAROSCOPIC CHOLECYSTECTOMY  2009  . MASTECTOMY  1983   bilateral    Family History  Problem Relation Age of Onset  . Colon cancer Mother 7  . Scleroderma Father 54  . Alcoholism Brother   . Stomach cancer Neg Hx     Social History:  reports that she quit smoking about 12 years ago. Her smoking use included cigarettes. She has a 7.50 pack-year smoking history. She has never used smokeless tobacco. She reports that she does not drink alcohol or use drugs. Patient smoked about 5 cigarettes a day for about 30 years; stopped in 2006. 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 Patient denies known exposures to radiation on toxins. Patient has 2 adult children. The patient is alone today.  Allergies:  Allergies  Allergen Reactions  . Benzalkonium Chloride Dermatitis    Other reaction(s): Unknown  .  Escitalopram     Deveioped a twitch  . Neomycin-Bacitracin Zn-Polymyx Dermatitis    Other reaction(s): Other (See Comments) REACTION: blisters skin REACTION: blisters skin REACTION: blisters skin    Current Medications: Current Outpatient Medications  Medication Sig Dispense Refill  . ALPRAZolam (XANAX) 0.25 MG tablet Take 0.25 mg by mouth 2 (two) times daily as needed.     Marland Kitchen aspirin EC 81 MG tablet Take 81 mg daily by mouth.    . Biotin 1000 MCG tablet Take 1,000 mcg by mouth daily.     . Cholecalciferol (VITAMIN D3) 2000 units capsule Take 2,000 Units  daily by mouth.     . Cyanocobalamin (B-12 PO) Take 2,500 mcg daily by mouth.     . fluticasone (FLONASE) 50 MCG/ACT nasal spray Place 1 spray into the nose daily.     . Ostomy Supplies (COLOPLAST SKIN BARRIER) WAFR Use 1 each every other day    . Ostomy Supplies (PREMIER DRAINABLE) Pouch MISC Use 1 Bag every other day    . Ostomy Supplies (SKIN PREP WIPES) MISC Use 1 each every other day    . predniSONE (DELTASONE) 5 MG tablet Take 2.5 mg by mouth daily.     . traMADol (ULTRAM) 50 MG tablet Take 50 mg by mouth 2 (two) times daily as needed for moderate pain.      No current facility-administered medications for this visit.     Review of Systems  Constitutional: Positive for weight loss (1 pound). Negative for chills, diaphoresis, fever and malaise/fatigue.       Feels "fine".  HENT: Positive for congestion (possible early cold symptoms). Negative for ear discharge, ear pain, hearing loss, nosebleeds, sinus pain, sore throat and tinnitus.        Chronic rhinorrhea  Eyes: Negative.  Negative for blurred vision, double vision, photophobia, pain, discharge and redness.  Respiratory: Negative.  Negative for cough, hemoptysis, sputum production and shortness of breath.   Cardiovascular: Negative.  Negative for chest pain, palpitations, orthopnea, leg swelling and PND.  Gastrointestinal: Negative.  Negative for abdominal pain, blood in stool, constipation, diarrhea, melena, nausea and vomiting.       Colostomy.  Genitourinary: Negative.  Negative for dysuria, frequency, hematuria and urgency.  Musculoskeletal: Negative.  Negative for back pain, falls, joint pain, myalgias and neck pain.  Skin: Negative.  Negative for itching and rash.  Neurological: Positive for headaches (right sided temporal pain). Negative for dizziness, tingling, tremors, sensory change, speech change and weakness.  Endo/Heme/Allergies: Negative.  Does not bruise/bleed easily.  Psychiatric/Behavioral: Negative for  depression, memory loss and suicidal ideas. The patient is not nervous/anxious and does not have insomnia.   All other systems reviewed and are negative.  Physical Exam: Blood pressure (!) 164/80, pulse 97, temperature (!) 97 F (36.1 C), temperature source Tympanic, resp. rate 18, weight 150 lb 6 oz (68.2 kg). GENERAL:  Well developed, well nourished, woman sitting comfortably in the exam room in no acute distress. MENTAL STATUS:  Alert and oriented to person, place and time. HEAD:  Short blonde hair.  Normocephalic, atraumatic, face symmetric, no Cushingoid features. EYES:  Blue eyes.  Pupils equal round and reactive to light and accomodation.  No conjunctivitis or scleral icterus. ENT:  Oropharynx clear without lesion.  Tongue normal. Mucous membranes moist.  RESPIRATORY:  Clear to auscultation without rales, wheezes or rhonchi. CARDIOVASCULAR:  Regular rate and rhythm without murmur, rub or gallop. ABDOMEN:  Colostomy.  Soft, non-tender, with active bowel sounds, and no  hepatosplenomegaly.  No masses. SKIN:  No rashes, ulcers or lesions. EXTREMITIES: No edema, no skin discoloration or tenderness.  No palpable cords. LYMPH NODES: No palpable cervical, supraclavicular, axillary or inguinal adenopathy  NEUROLOGICAL: Unremarkable. PSYCH:  Appropriate.    Appointment on 02/04/2018  Component Date Value Ref Range Status  . WBC 02/04/2018 2.2* 3.6 - 11.0 K/uL Final  . RBC 02/04/2018 4.43  3.80 - 5.20 MIL/uL Final  . Hemoglobin 02/04/2018 12.8  12.0 - 16.0 g/dL Final  . HCT 02/04/2018 39.4  35.0 - 47.0 % Final  . MCV 02/04/2018 89.0  80.0 - 100.0 fL Final  . MCH 02/04/2018 28.9  26.0 - 34.0 pg Final  . MCHC 02/04/2018 32.5  32.0 - 36.0 g/dL Final  . RDW 02/04/2018 14.6* 11.5 - 14.5 % Final  . Platelets 02/04/2018 257  150 - 440 K/uL Final  . Neutrophils Relative % 02/04/2018 40  % Final  . Neutro Abs 02/04/2018 0.9* 1.4 - 6.5 K/uL Final  . Lymphocytes Relative 02/04/2018 47  % Final  .  Lymphs Abs 02/04/2018 1.0  1.0 - 3.6 K/uL Final  . Monocytes Relative 02/04/2018 10  % Final  . Monocytes Absolute 02/04/2018 0.2  0.2 - 0.9 K/uL Final  . Eosinophils Relative 02/04/2018 2  % Final  . Eosinophils Absolute 02/04/2018 0.0  0 - 0.7 K/uL Final  . Basophils Relative 02/04/2018 1  % Final  . Basophils Absolute 02/04/2018 0.0  0 - 0.1 K/uL Final   Performed at [facility], [address] [place], [place] [address]    Assessment:  Tonya STALDER is a 83 y.o. female with progressive leukopenia since 03/2017.  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  She denies any new medications or herbal products.  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  Work-up on 08/20/2017 revealed a hematocrit of 40.8, hemoglobin 13.3, MCV 91.6, platelets 221,000, WBC 1800 with an ANC of 1100.  CMP was unremarkable.  ANA was negative.  HIV antibody, hepatitis C antibody, hepatitis B core antibody total, and hepatitis B surface antigen were normal.  Folate and copper were normal.  B12 was 3389 (high).  SPEP revealed no monoclonal protein. TSH was normal.  Bone marrow aspirate and biopsy on 09/04/2017 revealed a hypercellular marrow for age with trilineage hematopoiesis.  There was abundant mature neutrophils.  Significant dyspoiesis or increased blasts was not identified.  Flow cytometry revealed a predominance of T lymphocytes (16% of all cells) with no abnormal phenotype.  There was a minor B-cell population (13% of lymphocytes) with slight kappa light chain excess.  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  She was diagnosed with temporal arteritis 17 years ago.  She has been on chronic prednisone.  Her current maintenance dose is 2.5 mg a day.  She has a diffuse maculopapular rash.  Skin biopsy on 35/36/1443 revealed lichenoid interface dermatitis with patchy band-like infiltrate of lymphocytes in  papillary dermis beneath an acanthotic epidermis that contained foci of pankeratosis and hyperkeratosis.  This pattern could be c/w lichen planus, lichenoid drug eruption, lichenoid variant of connective tissue disease, and pityriasis lichenoides.  Head CT on 04.26/2019 revealed no acute intracranial abnormalities.  Chest CT on 09/21/2017 revealed no evidence of metastatic breast cancer or other thoracic malignancy. Incidental finding of a 5 mm non-obstructing calculus in the upper pole of the LEFT kidney.   She was admitted to [facility] from 10/11/2017 - 10/18/2017 with  pericolonic abscess due to diverticulitis.  She underwent sigmoid and left colon resection with colostomy (Hartmann's procedure) on 10/13/2017.  She was treated with Zosyn then switched to Augmentin to complete an outpatient course of antibiotics.  While hospitalized WBC was 1800 - 8100.  ANC was 7100.  Symptomatically, she is doing well.  She has some right sided temporal soreness.  Exam is unremarkable.    Plan: 1.  Labs today:  CBC with diff. 2.  Leukopenia:  Review bone marrow with Dr. Gari Crown.  No evidence of MDS.  Marrow was hypercellular.  Send to [facility] for second opinion.  Suspect leukopenia is autoimmune.  She remains on prednisone 2.5 mg/day per Dr. Jefm Bryant.  WBC appropriately increased post surgery.  Patient would like to try GCSF injections to prevent future infections. 3.  Preauth Neupogen (GCSF). 4.  Await pathology review of bone marrow from [facility]. 5.  RTC in 2 weeks for MD assessment, labs (CBC with diff, CMP), and +/- Neupogen.   Lequita Asal, MD  02/04/18, 3:17 PM

## 2018-02-15 ENCOUNTER — Telehealth: Payer: Self-pay | Admitting: *Deleted

## 2018-02-15 NOTE — Telephone Encounter (Signed)
-----   Message from Karen Kitchens, NP sent at 02/14/2018  4:54 PM EDT ----- Neutropenic. Sees [place] next week to discuss marrow re-read from [facility]. Will likely need GCSF.   Gaspar Bidding  ----- Message ----- From: Shirlean Kelly, RN Sent: 02/14/2018   4:33 PM EDT To: Karen Kitchens, NP  Would you look at these labs and tell me what to tell the patient.  There were a few abnormals. ----- Message ----- From: Donnita Falls, RN Sent: 02/14/2018   9:25 AM EDT To: Shirlean Kelly, RN  Pt called wants lab results please call her   Ts  tw

## 2018-02-15 NOTE — Telephone Encounter (Signed)
Called patient to inform her that she is neutropenic.  MD would like to see her next week.  Patient states she received a call yesterday and has an appointment to see [place] on Tuesday.

## 2018-02-18 NOTE — Progress Notes (Signed)
[facility] day:  02/19/2018   Chief Complaint: [name] is a 83 y.o. female with neutropenia who is seen for 2 week assessment.  HPI:  The patient was last seen in the hematology clinic on 02/04/2018.  At that time, patient was doing well overall. She denies any recent infections. She complained of a "soreness" to the RIGHT side of her head; PMH (+) for GCA. She also complained for cold symptoms with (+) clear nasal discharge. Exam was unremarkable. WBC 2200 ([place] 900).   Bone marrow was sent to [facility] pathology lab for a second opinion diagnostic review. [facility] marrow results revealed a hypercellular marrow with trilineage hematopoiesis showing no significant morphologic dyspoiesis and no increased blasts. Alternative causes should be considered including drug/toxin, autoimmune, infection, and nutritional deficiency.  In the interim, patient is doing well overall. She is having problems with her seasonal allergies. She is using Sudafed, which is effective. Patient complains of generalized weakness in her lower extremities. Patient denies that she has experienced any B symptoms. She denies any interval infections. She denies any issues with her colostomy. Patient states, "I am psychologically having problems with it". Patient is not a candidate for ostomy revision per patient.   Patient advises that she maintains an adequate appetite. She is eating well. Weight today is 154 lb 2 oz (69.9 kg), which compared to her last visit to the clinic, represents a  4 pound increase.  Patient denies pain in the clinic today.   Past Medical History:  Diagnosis Date  . Breast cancer ([place])    1985  (bilateral mastectomy )  . Depression   . Diverticulosis   . GERD (gastroesophageal reflux disease)   . Hiatal hernia   . History of Bell's palsy   . History of colon polyps   . History of nephrolithiasis   . History of pancreatitis   . Hyperlipidemia   . IBS  (irritable bowel syndrome)   . Osteoporosis   . Skin cancer   . Temporal arteritis ([place])   . Tubular adenoma of colon     Past Surgical History:  Procedure Laterality Date  . COLON RESECTION SIGMOID N/A 10/13/2017   Procedure: COLON RESECTION SIGMOID;  Surgeon: Herbert Pun, MD;  Location: [facility] ORS;  Service: General;  Laterality: N/A;  . COLOSTOMY N/A 10/13/2017   Procedure: COLOSTOMY;  Surgeon: Herbert Pun, MD;  Location: [facility] ORS;  Service: General;  Laterality: N/A;  . LAPAROSCOPIC CHOLECYSTECTOMY  2009  . MASTECTOMY  1983   bilateral    Family History  Problem Relation Age of Onset  . Colon cancer Mother 38  . Scleroderma Father 38  . Alcoholism Brother   . Stomach cancer Neg Hx     Social History:  reports that she quit smoking about 12 years ago. Her smoking use included cigarettes. She has a 7.50 pack-year smoking history. She has never used smokeless tobacco. She reports that she does not drink alcohol or use drugs. Patient smoked about 5 cigarettes a day for about 30 years; stopped in 2006. 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 Patient denies known exposures to radiation on toxins. Patient has 2 adult children. The patient is alone today.  Allergies:  Allergies  Allergen Reactions  . Benzalkonium Chloride Dermatitis    Other reaction(s): Unknown  . Escitalopram     Deveioped a twitch  . Neomycin-Bacitracin Zn-Polymyx Dermatitis    Other reaction(s): Other (See Comments) REACTION: blisters skin REACTION:  blisters skin REACTION: blisters skin    Current Medications: Current Outpatient Medications  Medication Sig Dispense Refill  . ALPRAZolam (XANAX) 0.25 MG tablet Take 0.25 mg by mouth 2 (two) times daily as needed.     Marland Kitchen aspirin EC 81 MG tablet Take 81 mg daily by mouth.    . Biotin 1000 MCG tablet Take 1,000 mcg by mouth daily.     . Cholecalciferol (VITAMIN D3) 2000 units capsule Take 2,000 Units daily by mouth.     .  Cyanocobalamin (B-12 PO) Take 2,500 mcg daily by mouth.     . fluticasone (FLONASE) 50 MCG/ACT nasal spray Place 1 spray into the nose daily.     . Ostomy Supplies (COLOPLAST SKIN BARRIER) WAFR Use 1 each every other day    . Ostomy Supplies (PREMIER DRAINABLE) Pouch MISC Use 1 Bag every other day    . Ostomy Supplies (SKIN PREP WIPES) MISC Use 1 each every other day    . predniSONE (DELTASONE) 5 MG tablet Take 2.5 mg by mouth daily.     . traMADol (ULTRAM) 50 MG tablet Take 50 mg by mouth 2 (two) times daily as needed for moderate pain.      No current facility-administered medications for this visit.     Review of Systems  Constitutional: Negative for diaphoresis, fever, malaise/fatigue and weight loss (up 4 pounds).  HENT: Negative.  Negative for nosebleeds, sinus pain and sore throat.        Chronic rhinorrhea  Eyes: Negative.   Respiratory: Negative for cough, hemoptysis, sputum production and shortness of breath.   Cardiovascular: Negative for chest pain, palpitations, orthopnea, leg swelling and PND.  Gastrointestinal: Negative for abdominal pain, blood in stool, constipation, diarrhea, melena, nausea and vomiting.       Colostomy  Genitourinary: Negative for dysuria, frequency, hematuria and urgency.  Musculoskeletal: Negative for back pain, falls, joint pain and myalgias.  Skin: Negative for itching and rash.       Slow healing wound to LEFT distal tib/fib area; no s/s of infection; scabbed  Neurological: Positive for weakness (BLE) and headaches (RIGHT temoporal pain. PMH (+) for GCA). Negative for dizziness and tremors.  Endo/Heme/Allergies: Positive for environmental allergies (seasonal ). Does not bruise/bleed easily.  Psychiatric/Behavioral: Negative for depression, memory loss and suicidal ideas. The patient is not nervous/anxious and does not have insomnia.   All other systems reviewed and are negative.  Performance status (ECOG): 1 - Symptomatic but completely  ambulatory  Vital Signs: BP (!) 147/77 (BP Location: Left Arm, Patient Position: Sitting)   Pulse 85   Temp 99 F (37.2 C) (Tympanic)   Resp 18   Wt 154 lb 2 oz (69.9 kg)   BMI 25.65 kg/m   Physical Exam  Constitutional: She is oriented to person, place, and time and well-developed, well-nourished, and in no distress.  HENT:  Head: Normocephalic and atraumatic.  Nose: Rhinorrhea present. No sinus tenderness.  Mouth/Throat: Mucous membranes are normal.  Short blonde hair  Eyes: Pupils are equal, round, and reactive to light. EOM are normal. No scleral icterus.  Blue eyes  Neck: Normal range of motion. Neck supple. No tracheal deviation present. No thyromegaly present.  Cardiovascular: Normal rate, regular rhythm and normal heart sounds. Exam reveals no gallop and no friction rub.  No murmur heard. Pulmonary/Chest: Effort normal and breath sounds normal. No respiratory distress. She has no wheezes. She has no rales.  Abdominal: Soft. Bowel sounds are normal. She exhibits no distension. There  is no tenderness.  LLQ colostomy  Musculoskeletal: Normal range of motion. She exhibits no edema or tenderness.  Neurological: She is alert and oriented to person, place, and time.  Skin: Skin is warm and dry. Abrasion (scabbed areas to LEFT distal tib/fib; slow healing; no erythema or signs of infection) noted. No rash noted. No erythema.  Psychiatric: Mood, affect and judgment normal.  Nursing note and vitals reviewed.   Orders Only on 02/19/2018  Component Date Value Ref Range Status  . Sodium 02/19/2018 140  135 - 145 mmol/L Final  . Potassium 02/19/2018 3.8  3.5 - 5.1 mmol/L Final  . Chloride 02/19/2018 105  98 - 111 mmol/L Final  . CO2 02/19/2018 25  22 - 32 mmol/L Final  . Glucose, Bld 02/19/2018 94  70 - 99 mg/dL Final  . BUN 02/19/2018 17  8 - 23 mg/dL Final  . Creatinine, Ser 02/19/2018 0.88  0.44 - 1.00 mg/dL Final  . Calcium 02/19/2018 9.0  8.9 - 10.3 mg/dL Final  . Total  Protein 02/19/2018 7.1  6.5 - 8.1 g/dL Final  . Albumin 02/19/2018 3.8  3.5 - 5.0 g/dL Final  . AST 02/19/2018 24  15 - 41 U/L Final  . ALT 02/19/2018 13  0 - 44 U/L Final  . Alkaline Phosphatase 02/19/2018 110  38 - 126 U/L Final  . Total Bilirubin 02/19/2018 0.7  0.3 - 1.2 mg/dL Final  . GFR calc non Af Amer 02/19/2018 60* >60 mL/min Final  . GFR calc Af Amer 02/19/2018 >60  >60 mL/min Final   Comment: (NOTE) The eGFR has been calculated using the CKD EPI equation. This calculation has not been validated in all clinical situations. eGFR's persistently <60 mL/min signify possible Chronic Kidney Disease.   Georgiann Hahn gap 02/19/2018 10  5 - 15 Final   Performed at [facility], [address] [place], [place] [address]  . WBC 02/19/2018 2.5* 3.6 - 11.0 K/uL Final  . RBC 02/19/2018 4.53  3.80 - 5.20 MIL/uL Final  . Hemoglobin 02/19/2018 13.2  12.0 - 16.0 g/dL Final  . HCT 02/19/2018 40.0  35.0 - 47.0 % Final  . MCV 02/19/2018 88.3  80.0 - 100.0 fL Final  . MCH 02/19/2018 29.2  26.0 - 34.0 pg Final  . MCHC 02/19/2018 33.0  32.0 - 36.0 g/dL Final  . RDW 02/19/2018 14.8* 11.5 - 14.5 % Final  . Platelets 02/19/2018 239  150 - 440 K/uL Final  . Neutrophils Relative % 02/19/2018 64  % Final  . Neutro Abs 02/19/2018 1.6  1.4 - 6.5 K/uL Final  . Lymphocytes Relative 02/19/2018 27  % Final  . Lymphs Abs 02/19/2018 0.7* 1.0 - 3.6 K/uL Final  . Monocytes Relative 02/19/2018 7  % Final  . Monocytes Absolute 02/19/2018 0.2  0.2 - 0.9 K/uL Final  . Eosinophils Relative 02/19/2018 1  % Final  . Eosinophils Absolute 02/19/2018 0.0  0 - 0.7 K/uL Final  . Basophils Relative 02/19/2018 1  % Final  . Basophils Absolute 02/19/2018 0.0  0 - 0.1 K/uL Final   Performed at [facility], [address], [place], [place] [address]    Assessment:  KEALANI LECKEY is a 83 y.o. female with progressive leukopenia since 03/2017.  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  She denies any new medications or herbal products.  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  Work-up on 08/20/2017  revealed a hematocrit of 40.8, hemoglobin 13.3, MCV 91.6, platelets 221,000, WBC 1800 with an ANC of 1100.  CMP was unremarkable.  ANA was negative.  HIV antibody, hepatitis C antibody, hepatitis B core antibody total, and hepatitis B surface antigen were normal.  Folate and copper were normal.  B12 was 3389 (high).  SPEP revealed no monoclonal protein. TSH was normal.  Bone marrow aspirate and biopsy on 09/04/2017 revealed a hypercellular marrow for age with trilineage hematopoiesis.  There was abundant mature neutrophils.  Significant dyspoiesis or increased blasts was not identified.  Flow cytometry revealed a predominance of T lymphocytes (16% of all cells) with no abnormal phenotype.  There was a minor B-cell population (13% of lymphocytes) with slight kappa light chain excess.  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  She was diagnosed with temporal arteritis 17 years ago.  She has been on chronic prednisone.  Her current maintenance dose is 2.5 mg a day.  She has a diffuse maculopapular rash.  Skin biopsy on 74/25/9563 revealed lichenoid interface dermatitis with patchy band-like infiltrate of lymphocytes in papillary dermis beneath an acanthotic epidermis that contained foci of pankeratosis and hyperkeratosis.  This pattern could be c/w lichen planus, lichenoid drug eruption, lichenoid variant of connective tissue disease, and pityriasis lichenoides.  Head CT on 04.26/2019 revealed no acute intracranial abnormalities.  Chest CT on 09/21/2017 revealed no evidence of metastatic breast cancer or other thoracic malignancy. Incidental finding of a 5 mm non-obstructing calculus in the upper pole of the LEFT kidney.   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  She underwent sigmoid and left colon resection with colostomy (Hartmann's procedure) on 10/13/2017.  She was treated with Zosyn then switched to Augmentin to complete an outpatient course of antibiotics.  While hospitalized WBC was 1800 - 8100.  ANC was 7100.  Symptomatically, she is doing well overall. She denies any acute concerns. Patient denies B symptoms or recent infections. Exam reveal a slow healing wound to her LEFT distal tib/fib without signs of infection.  Plan: 1. Labs today: CBC with diff, CMP. 2. Leukopenia  Labs reviewed. WBC 2500 (ANC 1600; ALC 700).  Review [facility] pathology results.   Discuss likely autoimmune etiology. Remains on chronic systemic steroids (prednisone 2.5 mg daily) as prescribed by rheumatology Jefm Bryant, MD) for temporal arteritis.  Discussed with Dr. Jefm Bryant today.  Review options to treat autoimmune leukopenia (steroids, IVIG, GCSF).  Patient wishes to avoid higher dose steroids.  Review use of GCSF injections to support blood counts. Side effects discussed. Patient wishes to proceed with injections as needed.  No GCSF today as ANC > 1500. 3. Discuss influenza and pneumonia vaccinations. Advised patient to hold off at this time due to low ANC/ALC.  4. RTC on Tuesdays for labs (CBC with diff) and +/- GCSF for ANC < 1500. 5. If patient receives GCSF on Tuesdays, then she will check Friday labs (CBC with diff) and +/- GCSF for ANC < 1500. 6. RTC in 6 weeks for MD assessment, labs (CBC with diff, CMP) and +/- GCSF.    Honor Loh, NP  02/19/18, 10:55 AM   I saw and evaluated the patient, participating in the key portions of the service and reviewing pertinent diagnostic studies and records.  I reviewed the nurse practitioner's note and agree with the findings and the plan.  The assessment and plan were discussed with the patient.  Multiple questions were asked by the patient and  answered.   Nolon Stalls, MD 02/19/18, 10:55 AM

## 2018-02-19 ENCOUNTER — Inpatient Hospital Stay: Payer: Medicare Other

## 2018-02-19 ENCOUNTER — Encounter: Payer: Self-pay | Admitting: Hematology and Oncology

## 2018-02-19 ENCOUNTER — Other Ambulatory Visit: Payer: Self-pay

## 2018-02-19 ENCOUNTER — Inpatient Hospital Stay: Payer: Medicare Other | Admitting: Hematology and Oncology

## 2018-02-19 VITALS — BP 147/77 | HR 85 | Temp 99.0°F | Resp 18 | Wt 154.1 lb

## 2018-02-19 DIAGNOSIS — Z85828 Personal history of other malignant neoplasm of skin: Secondary | ICD-10-CM

## 2018-02-19 DIAGNOSIS — M316 Other giant cell arteritis: Secondary | ICD-10-CM

## 2018-02-19 DIAGNOSIS — D708 Other neutropenia: Secondary | ICD-10-CM

## 2018-02-19 DIAGNOSIS — Z853 Personal history of malignant neoplasm of breast: Secondary | ICD-10-CM

## 2018-02-19 DIAGNOSIS — D709 Neutropenia, unspecified: Secondary | ICD-10-CM

## 2018-02-19 DIAGNOSIS — Z800 Family history of malignant neoplasm of digestive organs: Secondary | ICD-10-CM

## 2018-02-19 DIAGNOSIS — Z7952 Long term (current) use of systemic steroids: Secondary | ICD-10-CM

## 2018-02-19 DIAGNOSIS — D72819 Decreased white blood cell count, unspecified: Secondary | ICD-10-CM

## 2018-02-19 DIAGNOSIS — R531 Weakness: Secondary | ICD-10-CM | POA: Diagnosis not present

## 2018-02-19 DIAGNOSIS — Z79899 Other long term (current) drug therapy: Secondary | ICD-10-CM

## 2018-02-19 DIAGNOSIS — Z7982 Long term (current) use of aspirin: Secondary | ICD-10-CM

## 2018-02-19 DIAGNOSIS — Z87891 Personal history of nicotine dependence: Secondary | ICD-10-CM

## 2018-02-19 LAB — CBC WITH DIFFERENTIAL/PLATELET
Basophils Absolute: 0 10*3/uL (ref 0–0.1)
Basophils Relative: 1 %
Eosinophils Absolute: 0 10*3/uL (ref 0–0.7)
Eosinophils Relative: 1 %
HCT: 40 % (ref 35.0–47.0)
Hemoglobin: 13.2 g/dL (ref 12.0–16.0)
Lymphocytes Relative: 27 %
Lymphs Abs: 0.7 10*3/uL — ABNORMAL LOW (ref 1.0–3.6)
MCH: 29.2 pg (ref 26.0–34.0)
MCHC: 33 g/dL (ref 32.0–36.0)
MCV: 88.3 fL (ref 80.0–100.0)
Monocytes Absolute: 0.2 10*3/uL (ref 0.2–0.9)
Monocytes Relative: 7 %
Neutro Abs: 1.6 10*3/uL (ref 1.4–6.5)
Neutrophils Relative %: 64 %
Platelets: 239 10*3/uL (ref 150–440)
RBC: 4.53 MIL/uL (ref 3.80–5.20)
RDW: 14.8 % — ABNORMAL HIGH (ref 11.5–14.5)
WBC: 2.5 10*3/uL — ABNORMAL LOW (ref 3.6–11.0)

## 2018-02-19 LAB — COMPREHENSIVE METABOLIC PANEL
ALT: 13 U/L (ref 0–44)
AST: 24 U/L (ref 15–41)
Albumin: 3.8 g/dL (ref 3.5–5.0)
Alkaline Phosphatase: 110 U/L (ref 38–126)
Anion gap: 10 (ref 5–15)
BUN: 17 mg/dL (ref 8–23)
CO2: 25 mmol/L (ref 22–32)
Calcium: 9 mg/dL (ref 8.9–10.3)
Chloride: 105 mmol/L (ref 98–111)
Creatinine, Ser: 0.88 mg/dL (ref 0.44–1.00)
GFR calc Af Amer: >60 mL/min (ref >60)
GFR calc non Af Amer: 60 mL/min — ABNORMAL LOW (ref >60)
Glucose, Bld: 94 mg/dL (ref 70–99)
Potassium: 3.8 mmol/L (ref 3.5–5.1)
Sodium: 140 mmol/L (ref 135–145)
Total Bilirubin: 0.7 mg/dL (ref 0.3–1.2)
Total Protein: 7.1 g/dL (ref 6.5–8.1)

## 2018-02-19 NOTE — Progress Notes (Signed)
Patent states her allergies have flared up.  States she feels weak and not able to do very much.

## 2018-02-26 ENCOUNTER — Other Ambulatory Visit: Payer: Self-pay | Admitting: Hematology and Oncology

## 2018-02-26 ENCOUNTER — Inpatient Hospital Stay: Payer: Medicare Other | Attending: Hematology and Oncology

## 2018-02-26 ENCOUNTER — Inpatient Hospital Stay: Payer: Medicare Other

## 2018-02-26 DIAGNOSIS — D709 Neutropenia, unspecified: Secondary | ICD-10-CM | POA: Insufficient documentation

## 2018-02-26 DIAGNOSIS — D708 Other neutropenia: Secondary | ICD-10-CM

## 2018-02-26 LAB — CBC WITH DIFFERENTIAL/PLATELET
Basophils Absolute: 0 10*3/uL (ref 0–0.1)
Basophils Relative: 1 %
Eosinophils Absolute: 0 10*3/uL (ref 0–0.7)
Eosinophils Relative: 1 %
HCT: 41.3 % (ref 35.0–47.0)
Hemoglobin: 13.4 g/dL (ref 12.0–16.0)
Lymphocytes Relative: 31 %
Lymphs Abs: 0.8 10*3/uL — ABNORMAL LOW (ref 1.0–3.6)
MCH: 28.7 pg (ref 26.0–34.0)
MCHC: 32.4 g/dL (ref 32.0–36.0)
MCV: 88.7 fL (ref 80.0–100.0)
Monocytes Absolute: 0.2 10*3/uL (ref 0.2–0.9)
Monocytes Relative: 9 %
Neutro Abs: 1.6 10*3/uL (ref 1.4–6.5)
Neutrophils Relative %: 58 %
Platelets: 248 10*3/uL (ref 150–440)
RBC: 4.66 MIL/uL (ref 3.80–5.20)
RDW: 14.8 % — ABNORMAL HIGH (ref 11.5–14.5)
WBC: 2.7 10*3/uL — ABNORMAL LOW (ref 3.6–11.0)

## 2018-03-01 ENCOUNTER — Inpatient Hospital Stay: Payer: Medicare Other

## 2018-03-01 DIAGNOSIS — D708 Other neutropenia: Secondary | ICD-10-CM

## 2018-03-01 DIAGNOSIS — D72819 Decreased white blood cell count, unspecified: Secondary | ICD-10-CM

## 2018-03-01 DIAGNOSIS — D709 Neutropenia, unspecified: Secondary | ICD-10-CM | POA: Diagnosis not present

## 2018-03-01 LAB — CBC WITH DIFFERENTIAL/PLATELET
Basophils Absolute: 0 10*3/uL (ref 0–0.1)
Basophils Relative: 1 %
Eosinophils Absolute: 0 10*3/uL (ref 0–0.7)
Eosinophils Relative: 1 %
HCT: 39.6 % (ref 35.0–47.0)
Hemoglobin: 12.9 g/dL (ref 12.0–16.0)
Lymphocytes Relative: 38 %
Lymphs Abs: 0.7 10*3/uL — ABNORMAL LOW (ref 1.0–3.6)
MCH: 29.1 pg (ref 26.0–34.0)
MCHC: 32.7 g/dL (ref 32.0–36.0)
MCV: 89.1 fL (ref 80.0–100.0)
Monocytes Absolute: 0.1 10*3/uL — ABNORMAL LOW (ref 0.2–0.9)
Monocytes Relative: 7 %
Neutro Abs: 1.1 10*3/uL — ABNORMAL LOW (ref 1.4–6.5)
Neutrophils Relative %: 53 %
Platelets: 252 10*3/uL (ref 150–440)
RBC: 4.44 MIL/uL (ref 3.80–5.20)
RDW: 14.9 % — ABNORMAL HIGH (ref 11.5–14.5)
WBC: 2 10*3/uL — ABNORMAL LOW (ref 3.6–11.0)

## 2018-03-01 MED ORDER — TBO-FILGRASTIM 480 MCG/0.8ML ~~LOC~~ SOSY
300.0000 ug | PREFILLED_SYRINGE | Freq: Once | SUBCUTANEOUS | Status: AC
  Administered 2018-03-01: 300 ug via SUBCUTANEOUS

## 2018-03-05 ENCOUNTER — Inpatient Hospital Stay: Payer: Medicare Other

## 2018-03-05 DIAGNOSIS — D709 Neutropenia, unspecified: Secondary | ICD-10-CM | POA: Diagnosis not present

## 2018-03-05 DIAGNOSIS — D708 Other neutropenia: Secondary | ICD-10-CM

## 2018-03-05 LAB — CBC WITH DIFFERENTIAL/PLATELET
Abs Immature Granulocytes: 0 10*3/uL (ref 0.00–0.07)
Basophils Absolute: 0 10*3/uL (ref 0.0–0.1)
Basophils Relative: 0 %
Eosinophils Absolute: 0 10*3/uL (ref 0.0–0.5)
Eosinophils Relative: 0 %
HCT: 44.6 % (ref 36.0–46.0)
Hemoglobin: 13.6 g/dL (ref 12.0–15.0)
Lymphocytes Relative: 30 %
Lymphs Abs: 0.8 10*3/uL (ref 0.7–4.0)
MCH: 28.2 pg (ref 26.0–34.0)
MCHC: 30.5 g/dL (ref 30.0–36.0)
MCV: 92.3 fL (ref 80.0–100.0)
Monocytes Absolute: 0.1 10*3/uL (ref 0.1–1.0)
Monocytes Relative: 4 %
Neutro Abs: 1.7 10*3/uL (ref 1.7–7.7)
Neutrophils Relative %: 66 %
Platelets: 227 10*3/uL (ref 150–400)
RBC: 4.83 MIL/uL (ref 3.87–5.11)
RDW: 14.4 % (ref 11.5–15.5)
Smear Review: ADEQUATE
WBC: 2.6 10*3/uL — ABNORMAL LOW (ref 4.0–10.5)
nRBC: 0 % (ref 0.0–0.2)

## 2018-03-08 ENCOUNTER — Other Ambulatory Visit: Payer: Self-pay

## 2018-03-08 ENCOUNTER — Inpatient Hospital Stay: Payer: Medicare Other

## 2018-03-08 DIAGNOSIS — D72819 Decreased white blood cell count, unspecified: Secondary | ICD-10-CM

## 2018-03-08 DIAGNOSIS — D709 Neutropenia, unspecified: Secondary | ICD-10-CM | POA: Diagnosis not present

## 2018-03-08 LAB — CBC WITH DIFFERENTIAL/PLATELET
Abs Immature Granulocytes: 0.05 10*3/uL (ref 0.00–0.07)
BASOS PCT: 1 %
Basophils Absolute: 0 10*3/uL (ref 0.0–0.1)
EOS ABS: 0 10*3/uL (ref 0.0–0.5)
EOS PCT: 1 %
HCT: 43.5 % (ref 36.0–46.0)
Hemoglobin: 13.5 g/dL (ref 12.0–15.0)
IMMATURE GRANULOCYTES: 2 %
Lymphocytes Relative: 37 %
Lymphs Abs: 0.8 10*3/uL (ref 0.7–4.0)
MCH: 28.4 pg (ref 26.0–34.0)
MCHC: 31 g/dL (ref 30.0–36.0)
MCV: 91.6 fL (ref 80.0–100.0)
MONO ABS: 0.2 10*3/uL (ref 0.1–1.0)
MONOS PCT: 8 %
NEUTROS PCT: 51 %
Neutro Abs: 1.1 10*3/uL — ABNORMAL LOW (ref 1.7–7.7)
PLATELETS: 211 10*3/uL (ref 150–400)
RBC: 4.75 MIL/uL (ref 3.87–5.11)
RDW: 14.6 % (ref 11.5–15.5)
WBC: 2.2 10*3/uL — ABNORMAL LOW (ref 4.0–10.5)
nRBC: 0 % (ref 0.0–0.2)

## 2018-03-08 MED ORDER — TBO-FILGRASTIM 480 MCG/0.8ML ~~LOC~~ SOSY
300.0000 ug | PREFILLED_SYRINGE | Freq: Once | SUBCUTANEOUS | Status: AC
  Administered 2018-03-08: 300 ug via SUBCUTANEOUS

## 2018-03-12 ENCOUNTER — Inpatient Hospital Stay: Payer: Medicare Other

## 2018-03-12 ENCOUNTER — Other Ambulatory Visit: Payer: Self-pay

## 2018-03-12 DIAGNOSIS — D709 Neutropenia, unspecified: Secondary | ICD-10-CM | POA: Diagnosis not present

## 2018-03-12 DIAGNOSIS — D708 Other neutropenia: Secondary | ICD-10-CM

## 2018-03-12 LAB — CBC WITH DIFFERENTIAL/PLATELET
Abs Immature Granulocytes: 0.14 10*3/uL — ABNORMAL HIGH (ref 0.00–0.07)
Basophils Absolute: 0 10*3/uL (ref 0.0–0.1)
Basophils Relative: 0 %
Eosinophils Absolute: 0 10*3/uL (ref 0.0–0.5)
Eosinophils Relative: 1 %
HCT: 45.1 % (ref 36.0–46.0)
Hemoglobin: 13.5 g/dL (ref 12.0–15.0)
Immature Granulocytes: 4 %
Lymphocytes Relative: 27 %
Lymphs Abs: 1.1 10*3/uL (ref 0.7–4.0)
MCH: 27.7 pg (ref 26.0–34.0)
MCHC: 29.9 g/dL — ABNORMAL LOW (ref 30.0–36.0)
MCV: 92.4 fL (ref 80.0–100.0)
Monocytes Absolute: 0.4 10*3/uL (ref 0.1–1.0)
Monocytes Relative: 9 %
Neutro Abs: 2.4 10*3/uL (ref 1.7–7.7)
Neutrophils Relative %: 59 %
Platelets: 220 10*3/uL (ref 150–400)
RBC: 4.88 MIL/uL (ref 3.87–5.11)
RDW: 14.6 % (ref 11.5–15.5)
WBC: 4 10*3/uL (ref 4.0–10.5)
nRBC: 0 % (ref 0.0–0.2)

## 2018-03-15 ENCOUNTER — Inpatient Hospital Stay: Payer: Medicare Other

## 2018-03-19 ENCOUNTER — Inpatient Hospital Stay: Payer: Medicare Other

## 2018-03-19 DIAGNOSIS — D72819 Decreased white blood cell count, unspecified: Secondary | ICD-10-CM

## 2018-03-19 DIAGNOSIS — D708 Other neutropenia: Secondary | ICD-10-CM

## 2018-03-19 DIAGNOSIS — D709 Neutropenia, unspecified: Secondary | ICD-10-CM | POA: Diagnosis not present

## 2018-03-19 LAB — CBC WITH DIFFERENTIAL/PLATELET
Abs Immature Granulocytes: 0.03 10*3/uL (ref 0.00–0.07)
Basophils Absolute: 0 10*3/uL (ref 0.0–0.1)
Basophils Relative: 0 %
Eosinophils Absolute: 0 10*3/uL (ref 0.0–0.5)
Eosinophils Relative: 0 %
HCT: 42.2 % (ref 36.0–46.0)
Hemoglobin: 12.9 g/dL (ref 12.0–15.0)
Immature Granulocytes: 1 %
Lymphocytes Relative: 30 %
Lymphs Abs: 0.7 10*3/uL (ref 0.7–4.0)
MCH: 28 pg (ref 26.0–34.0)
MCHC: 30.6 g/dL (ref 30.0–36.0)
MCV: 91.5 fL (ref 80.0–100.0)
Monocytes Absolute: 0.3 10*3/uL (ref 0.1–1.0)
Monocytes Relative: 11 %
Neutro Abs: 1.3 10*3/uL — ABNORMAL LOW (ref 1.7–7.7)
Neutrophils Relative %: 58 %
Platelets: 237 10*3/uL (ref 150–400)
RBC: 4.61 MIL/uL (ref 3.87–5.11)
RDW: 14.9 % (ref 11.5–15.5)
WBC: 2.3 10*3/uL — ABNORMAL LOW (ref 4.0–10.5)
nRBC: 0 % (ref 0.0–0.2)

## 2018-03-19 MED ORDER — TBO-FILGRASTIM 300 MCG/0.5ML ~~LOC~~ SOSY
300.0000 ug | PREFILLED_SYRINGE | Freq: Once | SUBCUTANEOUS | Status: AC
  Administered 2018-03-19: 300 ug via SUBCUTANEOUS
  Filled 2018-03-19: qty 0.5

## 2018-03-19 MED ORDER — TBO-FILGRASTIM 480 MCG/0.8ML ~~LOC~~ SOSY: 300.0000 ug | PREFILLED_SYRINGE | Freq: Once | SUBCUTANEOUS | Status: DC

## 2018-03-22 ENCOUNTER — Other Ambulatory Visit: Payer: Self-pay

## 2018-03-22 ENCOUNTER — Inpatient Hospital Stay: Payer: Medicare Other

## 2018-03-22 DIAGNOSIS — D709 Neutropenia, unspecified: Secondary | ICD-10-CM | POA: Diagnosis not present

## 2018-03-22 DIAGNOSIS — D708 Other neutropenia: Secondary | ICD-10-CM

## 2018-03-22 LAB — CBC WITH DIFFERENTIAL/PLATELET
Abs Immature Granulocytes: 0.11 10*3/uL — ABNORMAL HIGH (ref 0.00–0.07)
Basophils Absolute: 0 10*3/uL (ref 0.0–0.1)
Basophils Relative: 1 %
Eosinophils Absolute: 0 10*3/uL (ref 0.0–0.5)
Eosinophils Relative: 1 %
HCT: 43 % (ref 36.0–46.0)
Hemoglobin: 13.1 g/dL (ref 12.0–15.0)
Immature Granulocytes: 3 %
Lymphocytes Relative: 29 %
Lymphs Abs: 0.9 10*3/uL (ref 0.7–4.0)
MCH: 28.4 pg (ref 26.0–34.0)
MCHC: 30.5 g/dL (ref 30.0–36.0)
MCV: 93.1 fL (ref 80.0–100.0)
Monocytes Absolute: 0.2 10*3/uL (ref 0.1–1.0)
Monocytes Relative: 7 %
Neutro Abs: 1.9 10*3/uL (ref 1.7–7.7)
Neutrophils Relative %: 59 %
Platelets: 246 10*3/uL (ref 150–400)
RBC: 4.62 MIL/uL (ref 3.87–5.11)
RDW: 14.9 % (ref 11.5–15.5)
WBC: 3.2 10*3/uL — ABNORMAL LOW (ref 4.0–10.5)
nRBC: 0 % (ref 0.0–0.2)

## 2018-03-26 ENCOUNTER — Inpatient Hospital Stay: Payer: Medicare Other

## 2018-03-26 DIAGNOSIS — D708 Other neutropenia: Secondary | ICD-10-CM

## 2018-03-26 DIAGNOSIS — D709 Neutropenia, unspecified: Secondary | ICD-10-CM | POA: Diagnosis not present

## 2018-03-26 DIAGNOSIS — D72819 Decreased white blood cell count, unspecified: Secondary | ICD-10-CM

## 2018-03-26 LAB — CBC WITH DIFFERENTIAL/PLATELET
Abs Immature Granulocytes: 0.03 10*3/uL (ref 0.00–0.07)
Basophils Absolute: 0 10*3/uL (ref 0.0–0.1)
Basophils Relative: 1 %
Eosinophils Absolute: 0 10*3/uL (ref 0.0–0.5)
Eosinophils Relative: 1 %
HCT: 42.9 % (ref 36.0–46.0)
Hemoglobin: 13.1 g/dL (ref 12.0–15.0)
Immature Granulocytes: 1 %
Lymphocytes Relative: 33 %
Lymphs Abs: 0.8 10*3/uL (ref 0.7–4.0)
MCH: 28.1 pg (ref 26.0–34.0)
MCHC: 30.5 g/dL (ref 30.0–36.0)
MCV: 91.9 fL (ref 80.0–100.0)
Monocytes Absolute: 0.3 10*3/uL (ref 0.1–1.0)
Monocytes Relative: 12 %
Neutro Abs: 1.3 10*3/uL — ABNORMAL LOW (ref 1.7–7.7)
Neutrophils Relative %: 52 %
Platelets: 223 10*3/uL (ref 150–400)
RBC: 4.67 MIL/uL (ref 3.87–5.11)
RDW: 14.9 % (ref 11.5–15.5)
WBC: 2.5 10*3/uL — ABNORMAL LOW (ref 4.0–10.5)
nRBC: 0 % (ref 0.0–0.2)

## 2018-03-26 MED ORDER — TBO-FILGRASTIM 480 MCG/0.8ML ~~LOC~~ SOSY
300.0000 ug | PREFILLED_SYRINGE | Freq: Once | SUBCUTANEOUS | Status: AC
  Administered 2018-03-26: 300 ug via SUBCUTANEOUS

## 2018-03-29 ENCOUNTER — Inpatient Hospital Stay: Payer: Medicare Other

## 2018-03-29 ENCOUNTER — Other Ambulatory Visit: Payer: Self-pay

## 2018-03-29 ENCOUNTER — Inpatient Hospital Stay: Payer: Medicare Other | Attending: Hematology and Oncology

## 2018-03-29 DIAGNOSIS — D708 Other neutropenia: Secondary | ICD-10-CM | POA: Insufficient documentation

## 2018-03-29 DIAGNOSIS — M316 Other giant cell arteritis: Secondary | ICD-10-CM | POA: Insufficient documentation

## 2018-03-29 DIAGNOSIS — Z7952 Long term (current) use of systemic steroids: Secondary | ICD-10-CM | POA: Insufficient documentation

## 2018-03-29 LAB — CBC WITH DIFFERENTIAL/PLATELET
Abs Immature Granulocytes: 0.51 10*3/uL — ABNORMAL HIGH (ref 0.00–0.07)
Basophils Absolute: 0 10*3/uL (ref 0.0–0.1)
Basophils Relative: 0 %
Eosinophils Absolute: 0 10*3/uL (ref 0.0–0.5)
Eosinophils Relative: 0 %
HCT: 45.5 % (ref 36.0–46.0)
Hemoglobin: 13.7 g/dL (ref 12.0–15.0)
Immature Granulocytes: 8 %
Lymphocytes Relative: 13 %
Lymphs Abs: 0.8 10*3/uL (ref 0.7–4.0)
MCH: 28.1 pg (ref 26.0–34.0)
MCHC: 30.1 g/dL (ref 30.0–36.0)
MCV: 93.2 fL (ref 80.0–100.0)
Monocytes Absolute: 0.2 10*3/uL (ref 0.1–1.0)
Monocytes Relative: 4 %
Neutro Abs: 4.7 10*3/uL (ref 1.7–7.7)
Neutrophils Relative %: 75 %
Platelets: 222 10*3/uL (ref 150–400)
RBC: 4.88 MIL/uL (ref 3.87–5.11)
RDW: 15.1 % (ref 11.5–15.5)
WBC: 6.2 10*3/uL (ref 4.0–10.5)
nRBC: 0 % (ref 0.0–0.2)

## 2018-04-02 ENCOUNTER — Inpatient Hospital Stay (HOSPITAL_BASED_OUTPATIENT_CLINIC_OR_DEPARTMENT_OTHER): Payer: Medicare Other | Admitting: Hematology and Oncology

## 2018-04-02 ENCOUNTER — Inpatient Hospital Stay: Payer: Medicare Other

## 2018-04-02 ENCOUNTER — Encounter: Payer: Self-pay | Admitting: Hematology and Oncology

## 2018-04-02 VITALS — BP 147/72 | HR 87 | Temp 97.4°F | Resp 18 | Wt 155.0 lb

## 2018-04-02 DIAGNOSIS — D708 Other neutropenia: Secondary | ICD-10-CM

## 2018-04-02 DIAGNOSIS — D72818 Other decreased white blood cell count: Secondary | ICD-10-CM

## 2018-04-02 DIAGNOSIS — M316 Other giant cell arteritis: Secondary | ICD-10-CM | POA: Diagnosis not present

## 2018-04-02 DIAGNOSIS — Z7952 Long term (current) use of systemic steroids: Secondary | ICD-10-CM | POA: Diagnosis not present

## 2018-04-02 LAB — CBC WITH DIFFERENTIAL/PLATELET
Abs Immature Granulocytes: 0.18 10*3/uL — ABNORMAL HIGH (ref 0.00–0.07)
Basophils Absolute: 0 10*3/uL (ref 0.0–0.1)
Basophils Relative: 0 %
Eosinophils Absolute: 0 10*3/uL (ref 0.0–0.5)
Eosinophils Relative: 1 %
HCT: 42.5 % (ref 36.0–46.0)
Hemoglobin: 13.1 g/dL (ref 12.0–15.0)
Immature Granulocytes: 6 %
Lymphocytes Relative: 20 %
Lymphs Abs: 0.6 10*3/uL — ABNORMAL LOW (ref 0.7–4.0)
MCH: 28.3 pg (ref 26.0–34.0)
MCHC: 30.8 g/dL (ref 30.0–36.0)
MCV: 91.8 fL (ref 80.0–100.0)
Monocytes Absolute: 0.2 10*3/uL (ref 0.1–1.0)
Monocytes Relative: 8 %
Neutro Abs: 2 10*3/uL (ref 1.7–7.7)
Neutrophils Relative %: 65 %
Platelets: 211 10*3/uL (ref 150–400)
RBC: 4.63 MIL/uL (ref 3.87–5.11)
RDW: 15.1 % (ref 11.5–15.5)
WBC: 3.1 10*3/uL — ABNORMAL LOW (ref 4.0–10.5)
nRBC: 0 % (ref 0.0–0.2)

## 2018-04-02 LAB — COMPREHENSIVE METABOLIC PANEL
ALT: 13 U/L (ref 0–44)
AST: 21 U/L (ref 15–41)
Albumin: 4 g/dL (ref 3.5–5.0)
Alkaline Phosphatase: 120 U/L (ref 38–126)
Anion gap: 7 (ref 5–15)
BUN: 18 mg/dL (ref 8–23)
CO2: 27 mmol/L (ref 22–32)
Calcium: 9 mg/dL (ref 8.9–10.3)
Chloride: 102 mmol/L (ref 98–111)
Creatinine, Ser: 0.85 mg/dL (ref 0.44–1.00)
GFR calc Af Amer: >60 mL/min (ref >60)
GFR calc non Af Amer: >60 mL/min (ref >60)
Glucose, Bld: 114 mg/dL — ABNORMAL HIGH (ref 70–99)
Potassium: 4.1 mmol/L (ref 3.5–5.1)
Sodium: 136 mmol/L (ref 135–145)
Total Bilirubin: 1.2 mg/dL (ref 0.3–1.2)
Total Protein: 7.2 g/dL (ref 6.5–8.1)

## 2018-04-02 NOTE — Progress Notes (Signed)
Patient has had a hernia come up at her stoma site. She has been to see Dr. Ferrel Logan.  Otherwise offers no complaints.

## 2018-04-02 NOTE — Progress Notes (Signed)
[facility] day:  04/02/2018   Chief Complaint: [name] is a 83 y.o. female with neutropenia who is seen for 6 week assessment.  HPI:  The patient was last seen in the hematology clinic on 02/19/2018.  At that time, she was doing well. She denied any acute concerns. Patient denied B symptoms or recent infections. Exam revealed a slow healing wound to her LEFT distal tib/fib without signs of infection.  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  She was on steroids for temporal arteritis.  Decision was made to pursue a trial of GCSF.  CBC has been followed.  WBC was 2500 (ANC 1600) on 02/19/2018, 2700 (ANC 1600) on 02/26/2018, 2000 (ANC 1100) on 03/01/2018, 2600 (ANC 1700) on 03/05/2018, 2200 (ANC 1100) on 03/08/2018, 4000 (ANC 2400) on 03/12/2018, 2300 ([place] 1300) on 03/19/2018, 3200 ([place] 1900) on 03/22/2018, 2500 ([place] 1300) on 03/20/2018, 2500 ([place] 1300) on 03/26/2018, and 6200 ([place] 4700) on 03/29/2018.  She has received GCSF 300 mcg when her Needles has been < 1500 on 03/01/2018, 03/08/2018, 03/19/2018, and 03/26/2018.  During the interim, patient has been doing well for the most part.  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  Patient has been seen in follow-up consult by surgery Windell Moment, MD). She advises that there are no planned interventions at this time.  Patient denies that she has experienced any B symptoms. She denies any interval infections.  She denies any nausea or vomiting. Stools noted to be liquid brown in colostomy bag.  Patient advises that she maintains an adequate appetite. She is eating well. Weight today is 155 lb (70.3 kg), which compared to her last visit to the clinic, represents a 1 pound weight increase.  Patient denies pain in the clinic today.   Past Medical History:   Diagnosis Date  . Breast cancer ([place])    1985  (bilateral mastectomy )  . Depression   . Diverticulosis   . GERD (gastroesophageal reflux disease)   . Hiatal hernia   . History of Bell's palsy   . History of colon polyps   . History of nephrolithiasis   . History of pancreatitis   . Hyperlipidemia   . IBS (irritable bowel syndrome)   . Osteoporosis   . Skin cancer   . Temporal arteritis ([place])   . Tubular adenoma of colon     Past Surgical History:  Procedure Laterality Date  . COLON RESECTION SIGMOID N/A 10/13/2017   Procedure: COLON RESECTION SIGMOID;  Surgeon: Herbert Pun, MD;  Location: [facility] ORS;  Service: General;  Laterality: N/A;  . COLOSTOMY N/A 10/13/2017   Procedure: COLOSTOMY;  Surgeon: Herbert Pun, MD;  Location: [facility] ORS;  Service: General;  Laterality: N/A;  . LAPAROSCOPIC CHOLECYSTECTOMY  2009  . MASTECTOMY  1983   bilateral    Family History  Problem Relation Age of Onset  . Colon cancer Mother 32  . Scleroderma Father 33  . Alcoholism Brother   . Stomach cancer Neg Hx     Social History:  reports that she quit smoking about 12 years ago. Her smoking use included cigarettes. She has a 7.50 pack-year smoking history. She has never used smokeless tobacco. She reports that she does not drink alcohol or use drugs. Patient smoked about 5 cigarettes a day for about 30 years; stopped in 2006. 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 Patient denies known exposures to radiation on toxins. Patient has 2 adult children. The patient is alone today.  Allergies:  Allergies  Allergen Reactions  . Benzalkonium Chloride Dermatitis    Other reaction(s): Unknown  . Escitalopram     Deveioped a twitch  . Neomycin-Bacitracin Zn-Polymyx Dermatitis    Other reaction(s): Other (See Comments) REACTION: blisters skin REACTION: blisters skin REACTION: blisters skin    Current Medications: Current Outpatient Medications  Medication Sig  Dispense Refill  . ALPRAZolam (XANAX) 0.25 MG tablet Take 0.25 mg by mouth 2 (two) times daily as needed.     Marland Kitchen aspirin EC 81 MG tablet Take 81 mg daily by mouth.    . Biotin 1000 MCG tablet Take 1,000 mcg by mouth daily.     . Cholecalciferol (VITAMIN D3) 2000 units capsule Take 2,000 Units daily by mouth.     . Cyanocobalamin (B-12 PO) Take 2,500 mcg daily by mouth.     . fluticasone (FLONASE) 50 MCG/ACT nasal spray Place 1 spray into the nose daily.     . Ostomy Supplies (COLOPLAST SKIN BARRIER) WAFR Use 1 each every other day    . Ostomy Supplies (PREMIER DRAINABLE) Pouch MISC Use 1 Bag every other day    . Ostomy Supplies (SKIN PREP WIPES) MISC Use 1 each every other day    . predniSONE (DELTASONE) 5 MG tablet Take 2.5 mg by mouth daily.     . traMADol (ULTRAM) 50 MG tablet Take 50 mg by mouth 2 (two) times daily as needed for moderate pain.      No current facility-administered medications for this visit.     Review of Systems  Constitutional: Negative.  Negative for chills, diaphoresis, fever, malaise/fatigue and weight loss (up 1 pound).       "I feel good".  HENT: Negative for congestion, nosebleeds, sinus pain and sore throat.        Chronic rhinorrhea  Eyes: Negative.  Negative for double vision, photophobia, pain, discharge and redness.  Respiratory: Negative for cough, hemoptysis, sputum production and shortness of breath.   Cardiovascular: Negative for chest pain, palpitations, orthopnea, leg swelling and PND.  Gastrointestinal: Positive for abdominal pain (herniated area slightly tender). Negative for blood in stool, constipation, diarrhea, melena, nausea and vomiting.       Colostomy. LLQ peri-stomal hernia.  Genitourinary: Negative for dysuria, frequency, hematuria and urgency.  Musculoskeletal: Negative for back pain, falls, joint pain and myalgias.  Skin: Negative.  Negative for itching and rash.  Neurological: Positive for headaches (chronic RIGHT sided; PMH (+) for  GCA). Negative for dizziness, tremors, focal weakness and weakness.  Endo/Heme/Allergies: Positive for environmental allergies (seasonal). Does not bruise/bleed easily.  Psychiatric/Behavioral: Negative for depression and memory loss. The patient is not nervous/anxious and does not have insomnia.   All other systems reviewed and are negative.  Performance status (ECOG): 1 - Symptomatic but completely ambulatory  Vital Signs: BP (!) 147/72 (BP Location: Left Arm, Patient Position: Sitting)   Pulse 87   Temp (!) 97.4 F (36.3 C) (Tympanic)   Resp 18   Wt 155 lb (70.3 kg)   BMI 25.79 kg/m   Physical Exam  Constitutional: She is oriented to person, place, and time and well-developed, well-nourished, and in no distress. No distress.  HENT:  Head: Normocephalic and atraumatic.  Nose: Rhinorrhea present.  Mouth/Throat: Oropharynx is clear and moist and mucous membranes are normal.  Short blonde hair.  Eyes: Pupils are  equal, round, and reactive to light. Conjunctivae and EOM are normal. Right eye exhibits no discharge. Left eye exhibits no discharge. No scleral icterus.  Neck: Normal range of motion. Neck supple. No JVD present.  Cardiovascular: Normal rate, regular rhythm, normal heart sounds and intact distal pulses. Exam reveals no gallop and no friction rub.  No murmur heard. Pulmonary/Chest: Effort normal and breath sounds normal. No respiratory distress. She has no wheezes. She has no rales.  Abdominal: Soft. Bowel sounds are normal. She exhibits no distension and no mass. There is no abdominal tenderness. There is no rebound and no guarding. A hernia (peri-stomal) is present.  LLQ colostomy  Musculoskeletal: Normal range of motion.        General: No tenderness or edema.  Lymphadenopathy:    She has no cervical adenopathy.    She has no axillary adenopathy.       Right: No inguinal and no supraclavicular adenopathy present.       Left: No inguinal and no supraclavicular  adenopathy present.  Neurological: She is alert and oriented to person, place, and time. Gait normal.  Skin: Skin is warm and dry. No rash noted. She is not diaphoretic. No erythema.  Psychiatric: Mood, affect and judgment normal.  Nursing note and vitals reviewed.   Appointment on 04/02/2018  Component Date Value Ref Range Status  . WBC 04/02/2018 3.1* 4.0 - 10.5 K/uL Final  . RBC 04/02/2018 4.63  3.87 - 5.11 MIL/uL Final  . Hemoglobin 04/02/2018 13.1  12.0 - 15.0 g/dL Final  . HCT 04/02/2018 42.5  36.0 - 46.0 % Final  . MCV 04/02/2018 91.8  80.0 - 100.0 fL Final  . MCH 04/02/2018 28.3  26.0 - 34.0 pg Final  . MCHC 04/02/2018 30.8  30.0 - 36.0 g/dL Final  . RDW 04/02/2018 15.1  11.5 - 15.5 % Final  . Platelets 04/02/2018 211  150 - 400 K/uL Final  . nRBC 04/02/2018 0.0  0.0 - 0.2 % Final  . Neutrophils Relative % 04/02/2018 65  % Final  . Neutro Abs 04/02/2018 2.0  1.7 - 7.7 K/uL Final  . Lymphocytes Relative 04/02/2018 20  % Final  . Lymphs Abs 04/02/2018 0.6* 0.7 - 4.0 K/uL Final  . Monocytes Relative 04/02/2018 8  % Final  . Monocytes Absolute 04/02/2018 0.2  0.1 - 1.0 K/uL Final  . Eosinophils Relative 04/02/2018 1  % Final  . Eosinophils Absolute 04/02/2018 0.0  0.0 - 0.5 K/uL Final  . Basophils Relative 04/02/2018 0  % Final  . Basophils Absolute 04/02/2018 0.0  0.0 - 0.1 K/uL Final  . WBC Morphology 04/02/2018 METAS NOTED ON SMEAR   Final  . Immature Granulocytes 04/02/2018 6  % Final   Increased IG's, likely caused by Bone Marrow Colony Stimulating Factor received within 30 days.  . Abs Immature Granulocytes 04/02/2018 0.18* 0.00 - 0.07 K/uL Final   Performed at [facility], [address] [place], [place] [address]  . Sodium 04/02/2018 136  135 - 145 mmol/L Final  . Potassium 04/02/2018 4.1  3.5 - 5.1 mmol/L Final  . Chloride 04/02/2018 102  98 - 111 mmol/L Final  . CO2 04/02/2018 27  22 - 32 mmol/L Final  . Glucose, Bld 04/02/2018 114* 70 - 99 mg/dL Final  .  BUN 04/02/2018 18  8 - 23 mg/dL Final  . Creatinine, Ser 04/02/2018 0.85  0.44 - 1.00 mg/dL Final  . Calcium 04/02/2018 9.0  8.9 - 10.3 mg/dL Final  .  Total Protein 04/02/2018 7.2  6.5 - 8.1 g/dL Final  . Albumin 04/02/2018 4.0  3.5 - 5.0 g/dL Final  . AST 04/02/2018 21  15 - 41 U/L Final  . ALT 04/02/2018 13  0 - 44 U/L Final  . Alkaline Phosphatase 04/02/2018 120  38 - 126 U/L Final  . Total Bilirubin 04/02/2018 1.2  0.3 - 1.2 mg/dL Final  . GFR calc non Af Amer 04/02/2018 >60  >60 mL/min Final  . GFR calc Af Amer 04/02/2018 >60  >60 mL/min Final   Comment: (NOTE) The eGFR has been calculated using the CKD EPI equation. This calculation has not been validated in all clinical situations. eGFR's persistently <60 mL/min signify possible Chronic Kidney Disease.   Georgiann Hahn gap 04/02/2018 7  5 - 15 Final   Performed at [facility], [address] [place], [place] [address]    Assessment:  LARK LANGENFELD is a 83 y.o. female with progressive leukopenia since 03/2017.  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  She denies any new medications or herbal products.  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  Work-up on 08/20/2017 revealed a hematocrit of 40.8, hemoglobin 13.3, MCV 91.6, platelets 221,000, WBC 1800 with an ANC of 1100.  CMP was unremarkable.  ANA was negative.  HIV antibody, hepatitis C antibody, hepatitis B core antibody total, and hepatitis B surface antigen were normal.  Folate and copper were normal.  B12 was 3389 (high).  SPEP revealed no monoclonal protein. TSH was normal.  Bone marrow aspirate and biopsy on 09/04/2017 revealed a hypercellular marrow for age with trilineage hematopoiesis.  There was abundant mature neutrophils.  Significant dyspoiesis or increased blasts was not identified.  Flow cytometry revealed a predominance of T lymphocytes (16% of all cells) with no abnormal  phenotype.  There was a minor B-cell population (13% of lymphocytes) with slight kappa light chain excess.  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  She was diagnosed with temporal arteritis 17 years ago.  She has been on chronic prednisone.  Her current maintenance dose is 2.5 mg a day.  She has a diffuse maculopapular rash.  Skin biopsy on 44/96/7591 revealed lichenoid interface dermatitis with patchy band-like infiltrate of lymphocytes in papillary dermis beneath an acanthotic epidermis that contained foci of pankeratosis and hyperkeratosis.  This pattern could be c/w lichen planus, lichenoid drug eruption, lichenoid variant of connective tissue disease, and pityriasis lichenoides.  Head CT on 04.26/2019 revealed no acute intracranial abnormalities.  Chest CT on 09/21/2017 revealed no evidence of metastatic breast cancer or other thoracic malignancy. Incidental finding of a 5 mm non-obstructing calculus in the upper pole of the LEFT kidney.   She was admitted to [facility] from 10/11/2017 - 10/18/2017 with pericolonic abscess due to diverticulitis.  She underwent sigmoid and left colon resection with colostomy (Hartmann's procedure) on 10/13/2017.  She was treated with Zosyn then switched to Augmentin to complete an outpatient course of antibiotics.  While hospitalized WBC was 1800 - 8100.  ANC was 7100.  She began a GCSF trial (300 mcg SQ q week if ANC < 1500).  She last received GCSF on 03/26/2018  Symptomatically, she is doing well overall.  She complains of developing a peristomal hernia to her LLQ colostomy site.  She has been evaluated by surgery Windell Moment, MD), with no planned intervention at this time.  She denies any B symptoms or recent infections.  Exam reveals  the aforementioned peristomal hernia, which is slightly tender to palpation.  WBC 3100 ([place] 2000).  Plan: 1. Labs today:  CBC with diff, CMP. 2. Leukopenia:  Labs reviewed.  WBC 3100 ([place] 2000, ALC  600).  Discuss that persistent leukopenia is likely autoimmune in nature.    Patient remains on chronic systemic steroids (prednisone 2.5 mg daily) as prescribed by rheumatology Jefm Bryant, MD) for GCA.  Review options to treat autoimmune leukopenia (steroids, IVIG, GCSF).    Patient wishes to avoid higher doses of steroids.  Discussed continuation of GCSF injections to support blood counts.  No G-CSF today as ANC is > 2000. 3. Peristomal hernia:  Acute development.  Area is slightly tender to palpation.  Not affecting normal functioning of colostomy.  Surgery has evaluated area, and there are no plans for immediate intervention at this time.  Follow-up with surgery Windell Moment, MD) for ongoing evaluation and intervention as needed. 4. Prophylactic immunization against influenza:  Patient requesting annual influenza vaccination.   Discussed risks and benefits of vaccination.   Given the fact the patient is chronically immunocompromised, and on chronic steroid therapy, patient is at increased risk for negative side effects related to vaccination.    Patient advised to discuss influenza prophylaxis with rheumatology Jefm Bryant, MD).   5. RTC on Tuesdays for labs (CBC with diff) and +/- GCSF for ANC < 1500. 6. RTC in 6 weeks for MD assessment, labs (CBC with diff, CMP) and +/- GCSF.    Honor Loh, NP  04/02/18, 12:08 PM   I saw and evaluated the patient, participating in the key portions of the service and reviewing pertinent diagnostic studies and records.  I reviewed the nurse practitioner's note and agree with the findings and the plan.  The assessment and plan were discussed with the patient.  Several questions were asked by the patient and answered.   Nolon Stalls, MD 04/02/2018, 12:08 PM

## 2018-04-09 ENCOUNTER — Inpatient Hospital Stay: Payer: Medicare Other

## 2018-04-09 DIAGNOSIS — D72819 Decreased white blood cell count, unspecified: Secondary | ICD-10-CM

## 2018-04-09 DIAGNOSIS — D708 Other neutropenia: Secondary | ICD-10-CM

## 2018-04-09 LAB — CBC WITH DIFFERENTIAL/PLATELET
Abs Immature Granulocytes: 0.06 10*3/uL (ref 0.00–0.07)
Basophils Absolute: 0 10*3/uL (ref 0.0–0.1)
Basophils Relative: 1 %
Eosinophils Absolute: 0 10*3/uL (ref 0.0–0.5)
Eosinophils Relative: 1 %
HCT: 44 % (ref 36.0–46.0)
Hemoglobin: 13.4 g/dL (ref 12.0–15.0)
Immature Granulocytes: 3 %
Lymphocytes Relative: 41 %
Lymphs Abs: 1 10*3/uL (ref 0.7–4.0)
MCH: 28.1 pg (ref 26.0–34.0)
MCHC: 30.5 g/dL (ref 30.0–36.0)
MCV: 92.2 fL (ref 80.0–100.0)
Monocytes Absolute: 0.3 10*3/uL (ref 0.1–1.0)
Monocytes Relative: 12 %
Neutro Abs: 1 10*3/uL — ABNORMAL LOW (ref 1.7–7.7)
Neutrophils Relative %: 42 %
Platelets: 251 10*3/uL (ref 150–400)
RBC: 4.77 MIL/uL (ref 3.87–5.11)
RDW: 14.9 % (ref 11.5–15.5)
WBC: 2.4 10*3/uL — ABNORMAL LOW (ref 4.0–10.5)
nRBC: 0 % (ref 0.0–0.2)

## 2018-04-09 MED ORDER — TBO-FILGRASTIM 480 MCG/0.8ML ~~LOC~~ SOSY
300.0000 ug | PREFILLED_SYRINGE | Freq: Once | SUBCUTANEOUS | Status: AC
  Administered 2018-04-09: 300 ug via SUBCUTANEOUS

## 2018-04-16 ENCOUNTER — Inpatient Hospital Stay: Payer: Medicare Other

## 2018-04-16 DIAGNOSIS — D72819 Decreased white blood cell count, unspecified: Secondary | ICD-10-CM

## 2018-04-16 DIAGNOSIS — D708 Other neutropenia: Secondary | ICD-10-CM | POA: Diagnosis not present

## 2018-04-16 LAB — CBC WITH DIFFERENTIAL/PLATELET
Abs Immature Granulocytes: 0.03 10*3/uL (ref 0.00–0.07)
Basophils Absolute: 0 10*3/uL (ref 0.0–0.1)
Basophils Relative: 1 %
Eosinophils Absolute: 0.1 10*3/uL (ref 0.0–0.5)
Eosinophils Relative: 2 %
HCT: 44.8 % (ref 36.0–46.0)
Hemoglobin: 13.5 g/dL (ref 12.0–15.0)
Immature Granulocytes: 1 %
Lymphocytes Relative: 50 %
Lymphs Abs: 1.4 10*3/uL (ref 0.7–4.0)
MCH: 27.7 pg (ref 26.0–34.0)
MCHC: 30.1 g/dL (ref 30.0–36.0)
MCV: 92 fL (ref 80.0–100.0)
Monocytes Absolute: 0.3 10*3/uL (ref 0.1–1.0)
Monocytes Relative: 12 %
Neutro Abs: 0.9 10*3/uL — ABNORMAL LOW (ref 1.7–7.7)
Neutrophils Relative %: 34 %
Platelets: 240 10*3/uL (ref 150–400)
RBC: 4.87 MIL/uL (ref 3.87–5.11)
RDW: 15 % (ref 11.5–15.5)
WBC: 2.8 10*3/uL — ABNORMAL LOW (ref 4.0–10.5)
nRBC: 0 % (ref 0.0–0.2)

## 2018-04-16 MED ORDER — TBO-FILGRASTIM 480 MCG/0.8ML ~~LOC~~ SOSY
300.0000 ug | PREFILLED_SYRINGE | Freq: Once | SUBCUTANEOUS | Status: AC
  Administered 2018-04-16: 300 ug via SUBCUTANEOUS

## 2018-04-18 ENCOUNTER — Other Ambulatory Visit: Payer: Self-pay | Admitting: General Surgery

## 2018-04-18 DIAGNOSIS — K469 Unspecified abdominal hernia without obstruction or gangrene: Principal | ICD-10-CM

## 2018-04-18 DIAGNOSIS — K9409 Other complications of colostomy: Secondary | ICD-10-CM

## 2018-04-23 ENCOUNTER — Inpatient Hospital Stay: Payer: Medicare Other

## 2018-04-23 DIAGNOSIS — D708 Other neutropenia: Secondary | ICD-10-CM | POA: Diagnosis not present

## 2018-04-23 DIAGNOSIS — D72819 Decreased white blood cell count, unspecified: Secondary | ICD-10-CM

## 2018-04-23 LAB — CBC WITH DIFFERENTIAL/PLATELET
Abs Immature Granulocytes: 0.01 10*3/uL (ref 0.00–0.07)
Basophils Absolute: 0 10*3/uL (ref 0.0–0.1)
Basophils Relative: 1 %
Eosinophils Absolute: 0.1 10*3/uL (ref 0.0–0.5)
Eosinophils Relative: 2 %
HCT: 42.6 % (ref 36.0–46.0)
Hemoglobin: 13.2 g/dL (ref 12.0–15.0)
Immature Granulocytes: 0 %
Lymphocytes Relative: 48 %
Lymphs Abs: 1.2 10*3/uL (ref 0.7–4.0)
MCH: 28.6 pg (ref 26.0–34.0)
MCHC: 31 g/dL (ref 30.0–36.0)
MCV: 92.2 fL (ref 80.0–100.0)
Monocytes Absolute: 0.4 10*3/uL (ref 0.1–1.0)
Monocytes Relative: 16 %
Neutro Abs: 0.8 10*3/uL — ABNORMAL LOW (ref 1.7–7.7)
Neutrophils Relative %: 33 %
Platelets: 211 10*3/uL (ref 150–400)
RBC: 4.62 MIL/uL (ref 3.87–5.11)
RDW: 14.9 % (ref 11.5–15.5)
WBC: 2.4 10*3/uL — ABNORMAL LOW (ref 4.0–10.5)
nRBC: 0 % (ref 0.0–0.2)

## 2018-04-23 MED ORDER — TBO-FILGRASTIM 480 MCG/0.8ML ~~LOC~~ SOSY
300.0000 ug | PREFILLED_SYRINGE | Freq: Once | SUBCUTANEOUS | Status: AC
  Administered 2018-04-23: 300 ug via SUBCUTANEOUS

## 2018-04-30 ENCOUNTER — Inpatient Hospital Stay: Payer: Medicare Other

## 2018-04-30 ENCOUNTER — Ambulatory Visit
Admission: RE | Admit: 2018-04-30 | Discharge: 2018-04-30 | Disposition: A | Discharge status: Home / Self Care | Payer: Medicare Other | Source: Ambulatory Visit | Attending: General Surgery | Admitting: General Surgery

## 2018-04-30 ENCOUNTER — Inpatient Hospital Stay: Payer: Medicare Other | Attending: Hematology and Oncology

## 2018-04-30 DIAGNOSIS — K469 Unspecified abdominal hernia without obstruction or gangrene: Secondary | ICD-10-CM | POA: Insufficient documentation

## 2018-04-30 DIAGNOSIS — K9409 Other complications of colostomy: Secondary | ICD-10-CM | POA: Insufficient documentation

## 2018-04-30 DIAGNOSIS — D708 Other neutropenia: Secondary | ICD-10-CM | POA: Insufficient documentation

## 2018-04-30 DIAGNOSIS — D72819 Decreased white blood cell count, unspecified: Secondary | ICD-10-CM

## 2018-04-30 LAB — CBC WITH DIFFERENTIAL/PLATELET
Abs Immature Granulocytes: 0.04 10*3/uL (ref 0.00–0.07)
Basophils Absolute: 0 10*3/uL (ref 0.0–0.1)
Basophils Relative: 1 %
Eosinophils Absolute: 0 10*3/uL (ref 0.0–0.5)
Eosinophils Relative: 1 %
HCT: 45.4 % (ref 36.0–46.0)
Hemoglobin: 13.8 g/dL (ref 12.0–15.0)
Immature Granulocytes: 2 %
Lymphocytes Relative: 36 %
Lymphs Abs: 0.9 10*3/uL (ref 0.7–4.0)
MCH: 28.1 pg (ref 26.0–34.0)
MCHC: 30.4 g/dL (ref 30.0–36.0)
MCV: 92.5 fL (ref 80.0–100.0)
Monocytes Absolute: 0.3 10*3/uL (ref 0.1–1.0)
Monocytes Relative: 12 %
Neutro Abs: 1.3 10*3/uL — ABNORMAL LOW (ref 1.7–7.7)
Neutrophils Relative %: 48 %
Platelets: 254 10*3/uL (ref 150–400)
RBC: 4.91 MIL/uL (ref 3.87–5.11)
RDW: 15 % (ref 11.5–15.5)
WBC: 2.6 10*3/uL — ABNORMAL LOW (ref 4.0–10.5)
nRBC: 0 % (ref 0.0–0.2)

## 2018-04-30 MED ORDER — IOPAMIDOL (ISOVUE-300) INJECTION 61%
100.0000 mL | Freq: Once | INTRAVENOUS | Status: AC | PRN
  Administered 2018-04-30: 100 mL via INTRAVENOUS

## 2018-04-30 MED ORDER — TBO-FILGRASTIM 480 MCG/0.8ML ~~LOC~~ SOSY
300.0000 ug | PREFILLED_SYRINGE | Freq: Once | SUBCUTANEOUS | Status: AC
  Administered 2018-04-30: 300 ug via SUBCUTANEOUS

## 2018-05-07 ENCOUNTER — Inpatient Hospital Stay: Payer: Medicare Other

## 2018-05-07 DIAGNOSIS — D708 Other neutropenia: Secondary | ICD-10-CM

## 2018-05-07 DIAGNOSIS — D72819 Decreased white blood cell count, unspecified: Secondary | ICD-10-CM

## 2018-05-07 LAB — CBC WITH DIFFERENTIAL/PLATELET
Abs Immature Granulocytes: 0.05 10*3/uL (ref 0.00–0.07)
Basophils Absolute: 0 10*3/uL (ref 0.0–0.1)
Basophils Relative: 1 %
Eosinophils Absolute: 0.1 10*3/uL (ref 0.0–0.5)
Eosinophils Relative: 2 %
HCT: 45.8 % (ref 36.0–46.0)
Hemoglobin: 13.9 g/dL (ref 12.0–15.0)
Immature Granulocytes: 2 %
Lymphocytes Relative: 40 %
Lymphs Abs: 1.2 10*3/uL (ref 0.7–4.0)
MCH: 28.1 pg (ref 26.0–34.0)
MCHC: 30.3 g/dL (ref 30.0–36.0)
MCV: 92.5 fL (ref 80.0–100.0)
Monocytes Absolute: 0.3 10*3/uL (ref 0.1–1.0)
Monocytes Relative: 12 %
Neutro Abs: 1.3 10*3/uL — ABNORMAL LOW (ref 1.7–7.7)
Neutrophils Relative %: 43 %
Platelets: 252 10*3/uL (ref 150–400)
RBC: 4.95 MIL/uL (ref 3.87–5.11)
RDW: 15.3 % (ref 11.5–15.5)
WBC: 2.9 10*3/uL — ABNORMAL LOW (ref 4.0–10.5)
nRBC: 0 % (ref 0.0–0.2)

## 2018-05-07 MED ORDER — TBO-FILGRASTIM 480 MCG/0.8ML ~~LOC~~ SOSY
300.0000 ug | PREFILLED_SYRINGE | Freq: Once | SUBCUTANEOUS | Status: AC
  Administered 2018-05-07: 300 ug via SUBCUTANEOUS

## 2018-05-14 ENCOUNTER — Encounter: Payer: Self-pay | Admitting: Hematology and Oncology

## 2018-05-14 ENCOUNTER — Inpatient Hospital Stay: Payer: Medicare Other

## 2018-05-14 ENCOUNTER — Other Ambulatory Visit: Payer: Self-pay | Admitting: Hematology and Oncology

## 2018-05-14 ENCOUNTER — Inpatient Hospital Stay (HOSPITAL_BASED_OUTPATIENT_CLINIC_OR_DEPARTMENT_OTHER): Payer: Medicare Other | Admitting: Hematology and Oncology

## 2018-05-14 VITALS — BP 159/84 | HR 84 | Temp 97.4°F | Resp 18 | Wt 158.4 lb

## 2018-05-14 DIAGNOSIS — Z7982 Long term (current) use of aspirin: Secondary | ICD-10-CM | POA: Diagnosis not present

## 2018-05-14 DIAGNOSIS — Z79899 Other long term (current) drug therapy: Secondary | ICD-10-CM

## 2018-05-14 DIAGNOSIS — D708 Other neutropenia: Secondary | ICD-10-CM

## 2018-05-14 DIAGNOSIS — Z9013 Acquired absence of bilateral breasts and nipples: Secondary | ICD-10-CM

## 2018-05-14 DIAGNOSIS — Z853 Personal history of malignant neoplasm of breast: Secondary | ICD-10-CM | POA: Diagnosis not present

## 2018-05-14 LAB — COMPREHENSIVE METABOLIC PANEL
ALT: 16 U/L (ref 0–44)
AST: 23 U/L (ref 15–41)
Albumin: 4.2 g/dL (ref 3.5–5.0)
Alkaline Phosphatase: 126 U/L (ref 38–126)
Anion gap: 11 (ref 5–15)
BUN: 18 mg/dL (ref 8–23)
CO2: 27 mmol/L (ref 22–32)
Calcium: 9.5 mg/dL (ref 8.9–10.3)
Chloride: 103 mmol/L (ref 98–111)
Creatinine, Ser: 0.93 mg/dL (ref 0.44–1.00)
GFR calc Af Amer: >60 mL/min (ref >60)
GFR calc non Af Amer: 58 mL/min — ABNORMAL LOW (ref >60)
Glucose, Bld: 100 mg/dL — ABNORMAL HIGH (ref 70–99)
Potassium: 4.1 mmol/L (ref 3.5–5.1)
Sodium: 141 mmol/L (ref 135–145)
Total Bilirubin: 1 mg/dL (ref 0.3–1.2)
Total Protein: 7.5 g/dL (ref 6.5–8.1)

## 2018-05-14 LAB — CBC WITH DIFFERENTIAL/PLATELET
Abs Immature Granulocytes: 0.03 10*3/uL (ref 0.00–0.07)
Basophils Absolute: 0 10*3/uL (ref 0.0–0.1)
Basophils Relative: 0 %
Eosinophils Absolute: 0.1 10*3/uL (ref 0.0–0.5)
Eosinophils Relative: 2 %
HCT: 44.9 % (ref 36.0–46.0)
Hemoglobin: 13.6 g/dL (ref 12.0–15.0)
Immature Granulocytes: 1 %
Lymphocytes Relative: 32 %
Lymphs Abs: 0.9 10*3/uL (ref 0.7–4.0)
MCH: 28 pg (ref 26.0–34.0)
MCHC: 30.3 g/dL (ref 30.0–36.0)
MCV: 92.6 fL (ref 80.0–100.0)
Monocytes Absolute: 0.3 10*3/uL (ref 0.1–1.0)
Monocytes Relative: 11 %
Neutro Abs: 1.6 10*3/uL — ABNORMAL LOW (ref 1.7–7.7)
Neutrophils Relative %: 54 %
Platelets: 238 10*3/uL (ref 150–400)
RBC: 4.85 MIL/uL (ref 3.87–5.11)
RDW: 14.9 % (ref 11.5–15.5)
WBC: 3 10*3/uL — ABNORMAL LOW (ref 4.0–10.5)
nRBC: 0 % (ref 0.0–0.2)

## 2018-05-14 NOTE — Progress Notes (Signed)
Patient states she has developed a hernia at her ostomy site.  States she is not sleeping well. Wakes up every morning around 3 and finds it hard to get back to sleep.  Offers no other complaints.

## 2018-05-14 NOTE — Progress Notes (Signed)
[facility] day:  05/14/2018   Chief Complaint: [name] is a 83 y.o. 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  HPI:  The patient was last seen in the hematology clinic on 04/02/2018.  At that time, she was doing well overall.  She complained of a developing peristomal hernia to her LLQ colostomy site.  She had been evaluated by surgery Windell Moment, MD), with no planned intervention at this time.  She denied any B symptoms or recent infections.  Exam revealed the peristomal hernia, which was slightly tender to palpation.  WBC was 3100 ([place] 2000).   Counts have been followed.  WBC has ranged between 2400 - 3100.  Dora has ranged between 800 - 2000.  She has received GCSF weekly x 5 (if ANC < 1500).  During the interim, patient is doing well overall. She denies any acute concerns. Patient with LLQ abdominal hernia. Plans are for surgical repair "once her white cells cooperative".  Area is intermittently tender, however no concern for incarceration/stangulation. Patient denies that she has experienced any B symptoms. She denies any interval infections.   Patient advises that she maintains an adequate appetite. She is eating well. Weight today is 158 lb 7 oz (71.9 kg), which compared to her last visit to the clinic, represents a 3 pound increase.    Patient denies pain in the clinic today.   Past Medical History:  Diagnosis Date  . Breast cancer ([place]) 1985   bilateral mastectomies  . Depression   . Diverticulosis   . GERD (gastroesophageal reflux disease)   . Hiatal hernia   . History of Bell's palsy   . History of colon polyps   . History of nephrolithiasis   . History of pancreatitis   . Hyperlipidemia   . IBS (irritable bowel syndrome)   . Osteoporosis   . Skin cancer   . Temporal arteritis ([place])   . Tubular adenoma of colon     Past Surgical History:  Procedure Laterality Date  . COLON  RESECTION SIGMOID N/A 10/13/2017   Procedure: COLON RESECTION SIGMOID;  Surgeon: Herbert Pun, MD;  Location: [facility] ORS;  Service: General;  Laterality: N/A;  . COLOSTOMY N/A 10/13/2017   Procedure: COLOSTOMY;  Surgeon: Herbert Pun, MD;  Location: [facility] ORS;  Service: General;  Laterality: N/A;  . LAPAROSCOPIC CHOLECYSTECTOMY  2009  . MASTECTOMY  1983   bilateral    Family History  Problem Relation Age of Onset  . Colon cancer Mother 86  . Scleroderma Father 69  . Alcoholism Brother   . Stomach cancer Neg Hx     Social History:  reports that she quit smoking about 13 years ago. Her smoking use included cigarettes. She has a 7.50 pack-year smoking history. She has never used smokeless tobacco. She reports that she does not drink alcohol or use drugs. Patient smoked about 5 cigarettes a day for about 30 years; stopped in 2006. 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 Patient denies known exposures to radiation on toxins. Patient has 2 adult children. The patient is alone today.  Allergies:  Allergies  Allergen Reactions  . Benzalkonium Chloride Dermatitis    Other reaction(s): Unknown  . Escitalopram     Deveioped a twitch  . Neomycin-Bacitracin Zn-Polymyx Dermatitis    Other reaction(s): Other (See Comments) REACTION: blisters skin REACTION: blisters skin REACTION: blisters skin    Current Medications: Current Outpatient Medications  Medication  Sig Dispense Refill  . ALPRAZolam (XANAX) 0.25 MG tablet Take 0.25 mg by mouth 2 (two) times daily as needed.     Marland Kitchen aspirin EC 81 MG tablet Take 81 mg daily by mouth.    . Biotin 1000 MCG tablet Take 1,000 mcg by mouth daily.     . Cholecalciferol (VITAMIN D3) 2000 units capsule Take 2,000 Units daily by mouth.     . Cyanocobalamin (B-12 PO) Take 2,500 mcg daily by mouth.     . fluticasone (FLONASE) 50 MCG/ACT nasal spray Place 1 spray into the nose daily.     . Ostomy Supplies (COLOPLAST SKIN BARRIER)  WAFR Use 1 each every other day    . Ostomy Supplies (PREMIER DRAINABLE) Pouch MISC Use 1 Bag every other day    . Ostomy Supplies (SKIN PREP WIPES) MISC Use 1 each every other day    . predniSONE (DELTASONE) 5 MG tablet Take 2.5 mg by mouth daily.     . traMADol (ULTRAM) 50 MG tablet Take 50 mg by mouth 2 (two) times daily as needed for moderate pain.      No current facility-administered medications for this visit.     Review of Systems  Constitutional: Negative.  Negative for chills, diaphoresis, fever, malaise/fatigue and weight loss (up 3 pounds).       "I'm good".  HENT: Negative for congestion, ear pain, nosebleeds, sinus pain, sore throat and tinnitus.        Chronic rhinorrhea.  Eyes: Negative.  Negative for double vision, photophobia, pain, discharge and redness.  Respiratory: Negative.  Negative for cough, hemoptysis, sputum production and shortness of breath.   Cardiovascular: Negative.  Negative for chest pain, palpitations, orthopnea, leg swelling and PND.  Gastrointestinal: Positive for abdominal pain (hernia slightly tender). Negative for blood in stool, constipation, diarrhea, melena, nausea and vomiting.       Colostomy. LLQ peri-stomal hernia.  Planning hernia surgery after 1st of year.  Genitourinary: Negative.  Negative for dysuria, frequency and hematuria.  Musculoskeletal: Negative.  Negative for back pain, falls, joint pain, myalgias and neck pain.  Skin: Negative.  Negative for rash.  Neurological: Positive for headaches (chronic RIGHT sided; PMH (+) for GCA). Negative for dizziness, tingling, tremors, sensory change, speech change, focal weakness and weakness.  Endo/Heme/Allergies: Positive for environmental allergies (seasonal). Does not bruise/bleed easily.  Psychiatric/Behavioral: Negative.  Negative for depression and memory loss. The patient is not nervous/anxious and does not have insomnia.   All other systems reviewed and are negative.  Performance status  (ECOG): 1  Vital Signs: BP (!) 159/84 (BP Location: Left Arm, Patient Position: Sitting)   Pulse 84   Temp (!) 97.4 F (36.3 C) (Tympanic)   Resp 18   Wt 158 lb 7 oz (71.9 kg)   BMI 26.37 kg/m   Physical Exam  Constitutional: She is oriented to person, place, and time and well-developed, well-nourished, and in no distress. No distress.  HENT:  Head: Normocephalic and atraumatic.  Mouth/Throat: Oropharynx is clear and moist and mucous membranes are normal. No oropharyngeal exudate.  Short blonde hair.  Eyes: Pupils are equal, round, and reactive to light. Conjunctivae and EOM are normal. No scleral icterus.  Blue eyes.  Neck: Neck supple. No JVD present.  Cardiovascular: Normal rate, regular rhythm, normal heart sounds and intact distal pulses. Exam reveals no gallop and no friction rub.  No murmur heard. Pulmonary/Chest: Effort normal and breath sounds normal. No respiratory distress. She has no wheezes. She has  no rales.  Abdominal: Soft. Bowel sounds are normal. She exhibits no distension and no mass. There is no abdominal tenderness. There is no rebound and no guarding. A hernia (peri-stomal) is present.  LLQ colostomy  Musculoskeletal: Normal range of motion.        General: No tenderness or edema.  Lymphadenopathy:    She has no cervical adenopathy.    She has no axillary adenopathy.       Right: No inguinal and no supraclavicular adenopathy present.       Left: No inguinal and no supraclavicular adenopathy present.  Neurological: She is alert and oriented to person, place, and time. Gait normal.  Skin: Skin is warm and dry. No rash noted. She is not diaphoretic. No erythema. No pallor.  Psychiatric: Mood, affect and judgment normal.  Nursing note and vitals reviewed.   Appointment on 05/14/2018  Component Date Value Ref Range Status  . Sodium 05/14/2018 141  135 - 145 mmol/L Final  . Potassium 05/14/2018 4.1  3.5 - 5.1 mmol/L Final  . Chloride 05/14/2018 103  98 - 111  mmol/L Final  . CO2 05/14/2018 27  22 - 32 mmol/L Final  . Glucose, Bld 05/14/2018 100* 70 - 99 mg/dL Final  . BUN 05/14/2018 18  8 - 23 mg/dL Final  . Creatinine, Ser 05/14/2018 0.93  0.44 - 1.00 mg/dL Final  . Calcium 05/14/2018 9.5  8.9 - 10.3 mg/dL Final  . Total Protein 05/14/2018 7.5  6.5 - 8.1 g/dL Final  . Albumin 05/14/2018 4.2  3.5 - 5.0 g/dL Final  . AST 05/14/2018 23  15 - 41 U/L Final  . ALT 05/14/2018 16  0 - 44 U/L Final  . Alkaline Phosphatase 05/14/2018 126  38 - 126 U/L Final  . Total Bilirubin 05/14/2018 1.0  0.3 - 1.2 mg/dL Final  . GFR calc non Af Amer 05/14/2018 58* >60 mL/min Final  . GFR calc Af Amer 05/14/2018 >60  >60 mL/min Final  . Anion gap 05/14/2018 11  5 - 15 Final   Performed at [facility], [address] [place], [place] [address]  . WBC 05/14/2018 3.0* 4.0 - 10.5 K/uL Final  . RBC 05/14/2018 4.85  3.87 - 5.11 MIL/uL Final  . Hemoglobin 05/14/2018 13.6  12.0 - 15.0 g/dL Final  . HCT 05/14/2018 44.9  36.0 - 46.0 % Final  . MCV 05/14/2018 92.6  80.0 - 100.0 fL Final  . MCH 05/14/2018 28.0  26.0 - 34.0 pg Final  . MCHC 05/14/2018 30.3  30.0 - 36.0 g/dL Final  . RDW 05/14/2018 14.9  11.5 - 15.5 % Final  . Platelets 05/14/2018 238  150 - 400 K/uL Final  . nRBC 05/14/2018 0.0  0.0 - 0.2 % Final  . Neutrophils Relative % 05/14/2018 54  % Final  . Neutro Abs 05/14/2018 1.6* 1.7 - 7.7 K/uL Final  . Lymphocytes Relative 05/14/2018 32  % Final  . Lymphs Abs 05/14/2018 0.9  0.7 - 4.0 K/uL Final  . Monocytes Relative 05/14/2018 11  % Final  . Monocytes Absolute 05/14/2018 0.3  0.1 - 1.0 K/uL Final  . Eosinophils Relative 05/14/2018 2  % Final  . Eosinophils Absolute 05/14/2018 0.1  0.0 - 0.5 K/uL Final  . Basophils Relative 05/14/2018 0  % Final  . Basophils Absolute 05/14/2018 0.0  0.0 - 0.1 K/uL Final  . Smear Review 05/14/2018 SMEAR SCANNED   Final  . Immature Granulocytes 05/14/2018 1  % Final  .  Abs Immature Granulocytes 05/14/2018 0.03  0.00  - 0.07 K/uL Final   Performed at [facility], [address] [place], [place] [address]    Assessment:  TERISSA HAFFEY is a 83 y.o. female with progressive leukopenia since 03/2017.  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  She denies any new medications or herbal products.  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  Work-up on 08/20/2017 revealed a hematocrit of 40.8, hemoglobin 13.3, MCV 91.6, platelets 221,000, WBC 1800 with an ANC of 1100.  CMP was unremarkable.  ANA was negative.  HIV antibody, hepatitis C antibody, hepatitis B core antibody total, and hepatitis B surface antigen were normal.  Folate and copper were normal.  B12 was 3389 (high).  SPEP revealed no monoclonal protein. TSH was normal.  Bone marrow aspirate and biopsy on 09/04/2017 revealed a hypercellular marrow for age with trilineage hematopoiesis.  There was abundant mature neutrophils.  Significant dyspoiesis or increased blasts was not identified.  Flow cytometry revealed a predominance of T lymphocytes (16% of all cells) with no abnormal phenotype.  There was a minor B-cell population (13% of lymphocytes) with slight kappa light chain excess.  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  She was diagnosed with temporal arteritis 17 years ago.  She has been on chronic prednisone.  Her current maintenance dose is 2.5 mg a day.  She has a diffuse maculopapular rash.  Skin biopsy on 03/50/0938 revealed lichenoid interface dermatitis with patchy band-like infiltrate of lymphocytes in papillary dermis beneath an acanthotic epidermis that contained foci of pankeratosis and hyperkeratosis.  This pattern could be c/w lichen planus, lichenoid drug eruption, lichenoid variant of connective tissue disease, and pityriasis lichenoides.  Head CT on 04.26/2019 revealed no acute intracranial abnormalities.  Chest CT on 09/21/2017  revealed no evidence of metastatic breast cancer or other thoracic malignancy. Incidental finding of a 5 mm non-obstructing calculus in the upper pole of the LEFT kidney.   She was admitted to [facility] from 10/11/2017 - 10/18/2017 with pericolonic abscess due to diverticulitis.  She underwent sigmoid and left colon resection with colostomy (Hartmann's procedure) on 10/13/2017.  She was treated with Zosyn then switched to Augmentin to complete an outpatient course of antibiotics.  While hospitalized WBC was 1800 - 8100.  ANC was 7100.  She began a GCSF trial (300 mcg SQ q week if ANC < 1500).  She last received GCSF on 05/07/2018  Symptomatically, she is doing well.  She has had no interval infections.  She plans to have hernia surgery after the first of the year.  Plan: 1. Labs today:  CBC with diff, CMP, LDH, SPEP, FLCA. 2. Leukopenia: Labs reviewed.  WBC 3000 (ANC 1600, ALC 900). Etiology of leukopenia is likely autoimmune.   Patient continues on chronic systemic steroids (prednisone 2.5 mg daily) as prescribed by Dr. Jefm Bryant for [place]. Patient has an excellent response to minimal GCSF support.  Plan to continue. No G-CSF today as ANC is > 1500. 3. Peristomal hernia: Patient notes plans for surgery next year. Discuss GCSF support as needed to avoid infection. Patient to follow-up with surgery (Dr. Windell Moment) for ongoing evaluation and intervention. 4. RTC on Tuesdays for labs (CBC with diff) and +/- GCSF for ANC < 1500. 5. RTC in 6 weeks for MD assessment and labs (CBC with diff, CMP), and +/- GCSF.    Honor Loh, NP  05/14/18, 11:35 AM   I saw  and evaluated the patient, participating in the key portions of the service and reviewing pertinent diagnostic studies and records.  I reviewed the nurse practitioner's note and agree with the findings and the plan.  The assessment and plan were discussed with the patient.  Several questions were asked by the patient and answered.   Nolon Stalls, MD 05/14/18, 11:35 AM

## 2018-05-15 LAB — KAPPA/LAMBDA LIGHT CHAINS
Kappa free light chain: 18.5 mg/L (ref 3.3–19.4)
Kappa, lambda light chain ratio: 1.36 (ref 0.26–1.65)
Lambda free light chains: 13.6 mg/L (ref 5.7–26.3)

## 2018-05-21 ENCOUNTER — Other Ambulatory Visit: Payer: Self-pay | Admitting: Hematology and Oncology

## 2018-05-21 ENCOUNTER — Inpatient Hospital Stay: Payer: Medicare Other

## 2018-05-21 ENCOUNTER — Other Ambulatory Visit: Payer: Self-pay

## 2018-05-21 DIAGNOSIS — D708 Other neutropenia: Secondary | ICD-10-CM

## 2018-05-21 DIAGNOSIS — D72819 Decreased white blood cell count, unspecified: Secondary | ICD-10-CM

## 2018-05-21 LAB — MULTIPLE MYELOMA PANEL, SERUM
Albumin SerPl Elph-Mcnc: 3.9 g/dL (ref 2.9–4.4)
Albumin/Glob SerPl: 1.3 (ref 0.7–1.7)
Alpha 1: 0.3 g/dL (ref 0.0–0.4)
Alpha2 Glob SerPl Elph-Mcnc: 0.9 g/dL (ref 0.4–1.0)
B-Globulin SerPl Elph-Mcnc: 1.2 g/dL (ref 0.7–1.3)
Gamma Glob SerPl Elph-Mcnc: 0.9 g/dL (ref 0.4–1.8)
Globulin, Total: 3.2 g/dL (ref 2.2–3.9)
IgA: 259 mg/dL (ref 64–422)
IgG (Immunoglobin G), Serum: 1005 mg/dL (ref 700–1600)
IgM (Immunoglobulin M), Srm: 129 mg/dL (ref 26–217)
Total Protein ELP: 7.1 g/dL (ref 6.0–8.5)

## 2018-05-21 LAB — CBC WITH DIFFERENTIAL/PLATELET
Abs Immature Granulocytes: 0.01 10*3/uL (ref 0.00–0.07)
Basophils Absolute: 0 10*3/uL (ref 0.0–0.1)
Basophils Relative: 0 %
Eosinophils Absolute: 0 10*3/uL (ref 0.0–0.5)
Eosinophils Relative: 1 %
HCT: 42.8 % (ref 36.0–46.0)
Hemoglobin: 13.2 g/dL (ref 12.0–15.0)
Immature Granulocytes: 0 %
Lymphocytes Relative: 36 %
Lymphs Abs: 0.9 10*3/uL (ref 0.7–4.0)
MCH: 28.4 pg (ref 26.0–34.0)
MCHC: 30.8 g/dL (ref 30.0–36.0)
MCV: 92.2 fL (ref 80.0–100.0)
Monocytes Absolute: 0.4 10*3/uL (ref 0.1–1.0)
Monocytes Relative: 15 %
Neutro Abs: 1.2 10*3/uL — ABNORMAL LOW (ref 1.7–7.7)
Neutrophils Relative %: 48 %
Platelets: 227 10*3/uL (ref 150–400)
RBC: 4.64 MIL/uL (ref 3.87–5.11)
RDW: 15 % (ref 11.5–15.5)
WBC: 2.5 10*3/uL — ABNORMAL LOW (ref 4.0–10.5)
nRBC: 0 % (ref 0.0–0.2)

## 2018-05-21 MED ORDER — TBO-FILGRASTIM 480 MCG/0.8ML ~~LOC~~ SOSY
300.0000 ug | PREFILLED_SYRINGE | Freq: Once | SUBCUTANEOUS | Status: AC
  Administered 2018-05-21: 300 ug via SUBCUTANEOUS

## 2018-05-28 ENCOUNTER — Inpatient Hospital Stay: Payer: Medicare Other

## 2018-05-28 ENCOUNTER — Other Ambulatory Visit: Payer: Self-pay

## 2018-05-28 DIAGNOSIS — D708 Other neutropenia: Secondary | ICD-10-CM

## 2018-05-28 DIAGNOSIS — D72819 Decreased white blood cell count, unspecified: Secondary | ICD-10-CM

## 2018-05-28 LAB — DIFFERENTIAL
Abs Immature Granulocytes: 0.14 10*3/uL — ABNORMAL HIGH (ref 0.00–0.07)
Basophils Absolute: 0 10*3/uL (ref 0.0–0.1)
Basophils Relative: 0 %
Eosinophils Absolute: 0 10*3/uL (ref 0.0–0.5)
Eosinophils Relative: 1 %
Immature Granulocytes: 4 %
Lymphocytes Relative: 23 %
Lymphs Abs: 0.8 10*3/uL (ref 0.7–4.0)
Monocytes Absolute: 0.2 10*3/uL (ref 0.1–1.0)
Monocytes Relative: 6 %
Neutro Abs: 2.1 10*3/uL (ref 1.7–7.7)
Neutrophils Relative %: 66 %

## 2018-05-28 LAB — CBC
HCT: 43.8 % (ref 36.0–46.0)
Hemoglobin: 13.5 g/dL (ref 12.0–15.0)
MCH: 28.4 pg (ref 26.0–34.0)
MCHC: 30.8 g/dL (ref 30.0–36.0)
MCV: 92 fL (ref 80.0–100.0)
Platelets: 233 10*3/uL (ref 150–400)
RBC: 4.76 MIL/uL (ref 3.87–5.11)
RDW: 14.6 % (ref 11.5–15.5)
WBC: 3.4 10*3/uL — ABNORMAL LOW (ref 4.0–10.5)
nRBC: 0 % (ref 0.0–0.2)

## 2018-06-04 ENCOUNTER — Inpatient Hospital Stay: Payer: Medicare Other

## 2018-06-04 ENCOUNTER — Other Ambulatory Visit: Payer: Self-pay

## 2018-06-04 ENCOUNTER — Inpatient Hospital Stay: Payer: Medicare Other | Attending: Hematology and Oncology

## 2018-06-04 DIAGNOSIS — Z853 Personal history of malignant neoplasm of breast: Secondary | ICD-10-CM | POA: Diagnosis not present

## 2018-06-04 DIAGNOSIS — Z85828 Personal history of other malignant neoplasm of skin: Secondary | ICD-10-CM | POA: Diagnosis not present

## 2018-06-04 DIAGNOSIS — D72819 Decreased white blood cell count, unspecified: Secondary | ICD-10-CM

## 2018-06-04 DIAGNOSIS — Z87891 Personal history of nicotine dependence: Secondary | ICD-10-CM | POA: Diagnosis not present

## 2018-06-04 DIAGNOSIS — Z800 Family history of malignant neoplasm of digestive organs: Secondary | ICD-10-CM | POA: Diagnosis not present

## 2018-06-04 DIAGNOSIS — D708 Other neutropenia: Secondary | ICD-10-CM

## 2018-06-04 DIAGNOSIS — Z79899 Other long term (current) drug therapy: Secondary | ICD-10-CM | POA: Diagnosis not present

## 2018-06-04 DIAGNOSIS — Z7952 Long term (current) use of systemic steroids: Secondary | ICD-10-CM | POA: Diagnosis not present

## 2018-06-04 DIAGNOSIS — Z7982 Long term (current) use of aspirin: Secondary | ICD-10-CM | POA: Diagnosis not present

## 2018-06-04 DIAGNOSIS — Z933 Colostomy status: Secondary | ICD-10-CM | POA: Diagnosis not present

## 2018-06-04 LAB — CBC WITH DIFFERENTIAL/PLATELET
Abs Immature Granulocytes: 0 10*3/uL (ref 0.00–0.07)
Basophils Absolute: 0 10*3/uL (ref 0.0–0.1)
Basophils Relative: 0 %
Eosinophils Absolute: 0 10*3/uL (ref 0.0–0.5)
Eosinophils Relative: 1 %
HCT: 42.2 % (ref 36.0–46.0)
Hemoglobin: 13 g/dL (ref 12.0–15.0)
Immature Granulocytes: 0 %
Lymphocytes Relative: 38 %
Lymphs Abs: 0.9 10*3/uL (ref 0.7–4.0)
MCH: 28.2 pg (ref 26.0–34.0)
MCHC: 30.8 g/dL (ref 30.0–36.0)
MCV: 91.5 fL (ref 80.0–100.0)
Monocytes Absolute: 0.3 10*3/uL (ref 0.1–1.0)
Monocytes Relative: 15 %
Neutro Abs: 1 10*3/uL — ABNORMAL LOW (ref 1.7–7.7)
Neutrophils Relative %: 46 %
Platelets: 215 10*3/uL (ref 150–400)
RBC: 4.61 MIL/uL (ref 3.87–5.11)
RDW: 14.6 % (ref 11.5–15.5)
WBC: 2.3 10*3/uL — ABNORMAL LOW (ref 4.0–10.5)
nRBC: 0 % (ref 0.0–0.2)

## 2018-06-04 MED ORDER — TBO-FILGRASTIM 480 MCG/0.8ML ~~LOC~~ SOSY
300.0000 ug | PREFILLED_SYRINGE | Freq: Once | SUBCUTANEOUS | Status: AC
  Administered 2018-06-04: 300 ug via SUBCUTANEOUS

## 2018-06-11 ENCOUNTER — Inpatient Hospital Stay: Payer: Medicare Other

## 2018-06-11 DIAGNOSIS — D708 Other neutropenia: Secondary | ICD-10-CM

## 2018-06-11 LAB — CBC WITH DIFFERENTIAL/PLATELET
Abs Immature Granulocytes: 0.01 10*3/uL (ref 0.00–0.07)
Basophils Absolute: 0 10*3/uL (ref 0.0–0.1)
Basophils Relative: 0 %
Eosinophils Absolute: 0 10*3/uL (ref 0.0–0.5)
Eosinophils Relative: 0 %
HCT: 45.6 % (ref 36.0–46.0)
Hemoglobin: 13.9 g/dL (ref 12.0–15.0)
Immature Granulocytes: 0 %
Lymphocytes Relative: 27 %
Lymphs Abs: 0.7 10*3/uL (ref 0.7–4.0)
MCH: 28.3 pg (ref 26.0–34.0)
MCHC: 30.5 g/dL (ref 30.0–36.0)
MCV: 92.7 fL (ref 80.0–100.0)
Monocytes Absolute: 0.1 10*3/uL (ref 0.1–1.0)
Monocytes Relative: 4 %
Neutro Abs: 1.8 10*3/uL (ref 1.7–7.7)
Neutrophils Relative %: 69 %
Platelets: 223 10*3/uL (ref 150–400)
RBC: 4.92 MIL/uL (ref 3.87–5.11)
RDW: 14.7 % (ref 11.5–15.5)
WBC: 2.6 10*3/uL — ABNORMAL LOW (ref 4.0–10.5)
nRBC: 0 % (ref 0.0–0.2)

## 2018-06-18 ENCOUNTER — Inpatient Hospital Stay: Payer: Medicare Other

## 2018-06-18 DIAGNOSIS — D708 Other neutropenia: Secondary | ICD-10-CM | POA: Diagnosis not present

## 2018-06-18 DIAGNOSIS — D72819 Decreased white blood cell count, unspecified: Secondary | ICD-10-CM

## 2018-06-18 LAB — CBC WITH DIFFERENTIAL/PLATELET
Abs Immature Granulocytes: 0.01 10*3/uL (ref 0.00–0.07)
Basophils Absolute: 0 10*3/uL (ref 0.0–0.1)
Basophils Relative: 0 %
Eosinophils Absolute: 0 10*3/uL (ref 0.0–0.5)
Eosinophils Relative: 1 %
HCT: 43.9 % (ref 36.0–46.0)
Hemoglobin: 13.5 g/dL (ref 12.0–15.0)
Immature Granulocytes: 0 %
Lymphocytes Relative: 38 %
Lymphs Abs: 0.9 10*3/uL (ref 0.7–4.0)
MCH: 28.5 pg (ref 26.0–34.0)
MCHC: 30.8 g/dL (ref 30.0–36.0)
MCV: 92.8 fL (ref 80.0–100.0)
Monocytes Absolute: 0.2 10*3/uL (ref 0.1–1.0)
Monocytes Relative: 9 %
Neutro Abs: 1.3 10*3/uL — ABNORMAL LOW (ref 1.7–7.7)
Neutrophils Relative %: 52 %
Platelets: 212 10*3/uL (ref 150–400)
RBC: 4.73 MIL/uL (ref 3.87–5.11)
RDW: 14.7 % (ref 11.5–15.5)
WBC: 2.4 10*3/uL — ABNORMAL LOW (ref 4.0–10.5)
nRBC: 0 % (ref 0.0–0.2)

## 2018-06-18 MED ORDER — TBO-FILGRASTIM 480 MCG/0.8ML ~~LOC~~ SOSY
300.0000 ug | PREFILLED_SYRINGE | Freq: Once | SUBCUTANEOUS | Status: AC
  Administered 2018-06-18: 300 ug via SUBCUTANEOUS

## 2018-06-24 NOTE — Progress Notes (Signed)
[facility] day:  06/25/2018   Chief Complaint: [name] is a 84 y.o. 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  HPI:  The patient was last seen in the hematology clinic on 05/14/2018.  At that time, she was doing well.  She had no interval infections.  She planned to have hernia surgery after the first of the year.  SPEP and FLCA were normal.  Hematocrit was 44.9, hemoglobin 13.6, MCV 92.6, platelets 238,000, WBC 3000 with an ANC of 1600.   CBC has been followed weekly.  WBC was 3400 ([place] 2100) on 05/28/2018, 2300 ([place] 1000) on 06/04/2018, 2600 ([place] 1800) on 06/11/2018, and 2400 ([place] 1300) on 06/18/2018.  She received GCSF on 05/21/2018, 01/107/2020, and 06/18/2018.  During the interim, patient has been feeling "fine". She has no acute concerns. She is feeling generally well. She describes 1 day of bone pain associated with GCSF injections. She has recently started taking the recommended loratadine, which helps tremendously. Patient denies that she has experienced any B symptoms. She denies any interval infections.   Patient has been seen in consult by Dr. Zachery Dauer regarding peri-stomal hernia repair. Patient advising that she was told that she was going to be referred to a surgeon at [facility] for evaluation and subsequent surgery.   Patient advises that she maintains an adequate appetite. She is eating well. Weight today is 160 lb 11.2 oz (72.9 kg), which compared to her last visit to the clinic, represents a 2 pound increase.    Patient denies pain in the clinic today.   Past Medical History:  Diagnosis Date  . Breast cancer ([place]) 1985   bilateral mastectomies  . Depression   . Diverticulosis   . GERD (gastroesophageal reflux disease)   . Hiatal hernia   . History of Bell's palsy   . History of colon polyps   . History of nephrolithiasis   . History of pancreatitis   . Hyperlipidemia   . IBS  (irritable bowel syndrome)   . Osteoporosis   . Skin cancer   . Temporal arteritis ([place])   . Tubular adenoma of colon     Past Surgical History:  Procedure Laterality Date  . COLON RESECTION SIGMOID N/A 10/13/2017   Procedure: COLON RESECTION SIGMOID;  Surgeon: Herbert Pun, MD;  Location: [facility] ORS;  Service: General;  Laterality: N/A;  . COLOSTOMY N/A 10/13/2017   Procedure: COLOSTOMY;  Surgeon: Herbert Pun, MD;  Location: [facility] ORS;  Service: General;  Laterality: N/A;  . LAPAROSCOPIC CHOLECYSTECTOMY  2009  . MASTECTOMY  1983   bilateral    Family History  Problem Relation Age of Onset  . Colon cancer Mother 70  . Scleroderma Father 50  . Alcoholism Brother   . Stomach cancer Neg Hx     Social History:  reports that she quit smoking about 13 years ago. Her smoking use included cigarettes. She has a 7.50 pack-year smoking history. She has never used smokeless tobacco. She reports that she does not drink alcohol or use drugs. Patient smoked about 5 cigarettes a day for about 30 years; stopped in 2006. 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 Patient denies known exposures to radiation on toxins. Patient has 2 adult children. The patient is alone today.  Allergies:  Allergies  Allergen Reactions  . Benzalkonium Chloride Dermatitis    Other reaction(s): Unknown  . Escitalopram     Deveioped a twitch  .  Neomycin-Bacitracin Zn-Polymyx Dermatitis    Other reaction(s): Other (See Comments) REACTION: blisters skin REACTION: blisters skin REACTION: blisters skin    Current Medications: Current Outpatient Medications  Medication Sig Dispense Refill  . aspirin EC 81 MG tablet Take 81 mg daily by mouth.    . Biotin 1000 MCG tablet Take 1,000 mcg by mouth daily.     . Cholecalciferol (VITAMIN D3) 2000 units capsule Take 2,000 Units daily by mouth.     . Cyanocobalamin (B-12 PO) Take 2,500 mcg daily by mouth.     . predniSONE (DELTASONE) 5 MG tablet  Take 2.5 mg by mouth daily.     . traMADol (ULTRAM) 50 MG tablet Take 50 mg by mouth daily.     Marland Kitchen ALPRAZolam (XANAX) 0.25 MG tablet Take 0.25 mg by mouth 2 (two) times daily as needed.     . fluticasone (FLONASE) 50 MCG/ACT nasal spray Place 1 spray into the nose daily.     . Ostomy Supplies (COLOPLAST SKIN BARRIER) WAFR Use 1 each every other day    . Ostomy Supplies (PREMIER DRAINABLE) Pouch MISC Use 1 Bag every other day    . Ostomy Supplies (SKIN PREP WIPES) MISC Use 1 each every other day     No current facility-administered medications for this visit.     Review of Systems  Constitutional: Negative.  Negative for chills, diaphoresis, fever, malaise/fatigue and weight loss (up 2 pounds).       Feels "good".  HENT: Negative.  Negative for congestion, ear pain, nosebleeds, sinus pain, sore throat and tinnitus.   Eyes: Negative.  Negative for blurred vision, double vision and photophobia.  Respiratory: Negative.  Negative for cough, hemoptysis, sputum production and shortness of breath.   Cardiovascular: Negative.  Negative for chest pain, palpitations, orthopnea, leg swelling and PND.  Gastrointestinal: Negative for abdominal pain, blood in stool, constipation, diarrhea, heartburn, melena, nausea and vomiting.       Colostomy. LLQ peri-stomal hernia.  Planning hernia surgery [facility] referral).  Genitourinary: Negative.  Negative for dysuria, frequency, hematuria and urgency.  Musculoskeletal: Negative.  Negative for back pain, falls, joint pain, myalgias and neck pain.  Skin: Negative.  Negative for itching and rash.  Neurological: Negative.  Negative for dizziness, tingling, tremors, sensory change, speech change, focal weakness, weakness and headaches.  Endo/Heme/Allergies: Positive for environmental allergies (seasonal). Does not bruise/bleed easily.  Psychiatric/Behavioral: Negative.  Negative for memory loss. The patient is not nervous/anxious and does not have insomnia.   All other  systems reviewed and are negative.  Performance status (ECOG): 1  Vital Signs: BP (!) 169/79 (BP Location: Left Arm, Patient Position: Sitting)   Pulse 83   Temp 98.1 F (36.7 C) (Tympanic)   Resp 18   Ht '5\' 5"'  (1.651 m)   Wt 160 lb 11.2 oz (72.9 kg)   SpO2 99%   BMI 26.74 kg/m   Physical Exam  Constitutional: She is oriented to person, place, and time and well-developed, well-nourished, and in no distress. No distress.  HENT:  Head: Normocephalic and atraumatic.  Mouth/Throat: Oropharynx is clear and moist and mucous membranes are normal. No oropharyngeal exudate.  Short blonde hair.  Eyes: Pupils are equal, round, and reactive to light. Conjunctivae and EOM are normal. No scleral icterus.  Blue eyes.  Neck: Normal range of motion. Neck supple. No JVD present.  Cardiovascular: Normal rate, regular rhythm and normal heart sounds. Exam reveals no gallop and no friction rub.  No murmur heard. Pulmonary/Chest: Breath  sounds normal. No respiratory distress. She has no wheezes. She has no rales.  Abdominal: Soft. Bowel sounds are normal. She exhibits no distension and no mass. There is no abdominal tenderness. There is no rebound and no guarding. A hernia (peri-stomal) is present.  LLQ colostomy.  Musculoskeletal: Normal range of motion.        General: No tenderness or edema.  Lymphadenopathy:    She has no cervical adenopathy.    She has no axillary adenopathy.       Right: No supraclavicular adenopathy present.       Left: No supraclavicular adenopathy present.  Neurological: She is alert and oriented to person, place, and time. Gait normal.  Skin: Skin is warm and dry. No rash noted. She is not diaphoretic. No erythema. No pallor.  Psychiatric: Mood, affect and judgment normal.  Nursing note and vitals reviewed.   Appointment on 06/25/2018  Component Date Value Ref Range Status  . Sodium 06/25/2018 142  135 - 145 mmol/L Final  . Potassium 06/25/2018 4.6  3.5 - 5.1 mmol/L  Final  . Chloride 06/25/2018 104  98 - 111 mmol/L Final  . CO2 06/25/2018 27  22 - 32 mmol/L Final  . Glucose, Bld 06/25/2018 110* 70 - 99 mg/dL Final  . BUN 06/25/2018 15  8 - 23 mg/dL Final  . Creatinine, Ser 06/25/2018 0.92  0.44 - 1.00 mg/dL Final  . Calcium 06/25/2018 9.4  8.9 - 10.3 mg/dL Final  . Total Protein 06/25/2018 7.9  6.5 - 8.1 g/dL Final  . Albumin 06/25/2018 4.3  3.5 - 5.0 g/dL Final  . AST 06/25/2018 28  15 - 41 U/L Final  . ALT 06/25/2018 18  0 - 44 U/L Final  . Alkaline Phosphatase 06/25/2018 118  38 - 126 U/L Final  . Total Bilirubin 06/25/2018 1.0  0.3 - 1.2 mg/dL Final  . GFR calc non Af Amer 06/25/2018 58* >60 mL/min Final  . GFR calc Af Amer 06/25/2018 >60  >60 mL/min Final  . Anion gap 06/25/2018 11  5 - 15 Final   Performed at [facility], [address] [place],  [address]  . WBC 06/25/2018 2.5* 4.0 - 10.5 K/uL Final  . RBC 06/25/2018 4.98  3.87 - 5.11 MIL/uL Final  . Hemoglobin 06/25/2018 14.2  12.0 - 15.0 g/dL Final  . HCT 06/25/2018 46.1* 36.0 - 46.0 % Final  . MCV 06/25/2018 92.6  80.0 - 100.0 fL Final  . MCH 06/25/2018 28.5  26.0 - 34.0 pg Final  . MCHC 06/25/2018 30.8  30.0 - 36.0 g/dL Final  . RDW 06/25/2018 14.4  11.5 - 15.5 % Final  . Platelets 06/25/2018 224  150 - 400 K/uL Final  . nRBC 06/25/2018 0.0  0.0 - 0.2 % Final  . Neutrophils Relative % 06/25/2018 61  % Final  . Neutro Abs 06/25/2018 1.5* 1.7 - 7.7 K/uL Final  . Lymphocytes Relative 06/25/2018 31  % Final  . Lymphs Abs 06/25/2018 0.8  0.7 - 4.0 K/uL Final  . Monocytes Relative 06/25/2018 7  % Final  . Monocytes Absolute 06/25/2018 0.2  0.1 - 1.0 K/uL Final  . Eosinophils Relative 06/25/2018 1  % Final  . Eosinophils Absolute 06/25/2018 0.0  0.0 - 0.5 K/uL Final  . Basophils Relative 06/25/2018 0  % Final  . Basophils Absolute 06/25/2018 0.0  0.0 - 0.1 K/uL Final  . Immature Granulocytes 06/25/2018 0  % Final  . Abs Immature Granulocytes 06/25/2018 0.00  0.00 - 0.07  K/uL Final   Performed at [facility], [address] [place], [place] [address]    Assessment:  QUANESHA KLIMASZEWSKI is a 84 y.o. female with progressive leukopenia since 03/2017.  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  She denies any new medications or herbal products.  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  Work-up on 08/20/2017 revealed a hematocrit of 40.8, hemoglobin 13.3, MCV 91.6, platelets 221,000, WBC 1800 with an ANC of 1100.  CMP was unremarkable.  ANA was negative.  HIV antibody, hepatitis C antibody, hepatitis B core antibody total, and hepatitis B surface antigen were normal.  Folate and copper were normal.  B12 was 3389 (high).  SPEP revealed no monoclonal protein. TSH was normal.  Bone marrow aspirate and biopsy on 09/04/2017 revealed a hypercellular marrow for age with trilineage hematopoiesis.  There was abundant mature neutrophils.  Significant dyspoiesis or increased blasts was not identified.  Flow cytometry revealed a predominance of T lymphocytes (16% of all cells) with no abnormal phenotype.  There was a minor B-cell population (13% of lymphocytes) with slight kappa light chain excess.  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  She was diagnosed with temporal arteritis 17 years ago.  She has been on chronic prednisone.  Her current maintenance dose is 2.5 mg a day.  She has a diffuse maculopapular rash.  Skin biopsy on 76/72/0947 revealed lichenoid interface dermatitis with patchy band-like infiltrate of lymphocytes in papillary dermis beneath an acanthotic epidermis that contained foci of pankeratosis and hyperkeratosis.  This pattern could be c/w lichen planus, lichenoid drug eruption, lichenoid variant of connective tissue disease, and pityriasis lichenoides.  Head CT on 04.26/2019 revealed no acute intracranial abnormalities.  Chest CT on 09/21/2017 revealed no  evidence of metastatic breast cancer or other thoracic malignancy. Incidental finding of a 5 mm non-obstructing calculus in the upper pole of the LEFT kidney.   She was admitted to [facility] from 10/11/2017 - 10/18/2017 with pericolonic abscess due to diverticulitis.  She underwent sigmoid and left colon resection with colostomy (Hartmann's procedure) on 10/13/2017.  She was treated with Zosyn then switched to Augmentin to complete an outpatient course of antibiotics.  While hospitalized WBC was 1800 - 8100.  ANC was 7100.  [place] not checked when WBC was 1800.  She began a GCSF trial (300 mcg SQ q week if ANC < 1500).  She last received GCSF on 06/18/2018  Symptomatically, she continues to do well.  She has had no infections.  [place] has remained adequate with intermittent GCSF.  ANC is 1500.  Plan: 1. Labs today:  CBC with diff, CMP. 2. Leukopenia: Labs reviewed.  WBC is 2500 with an [place] of 1500. Etiology of leukopenia is likely autoimmune.   Patient remains on chronic systemic steroids (prednisone 2.5 mg daily) as prescribed by Dr. Jefm Bryant for [place]. Patient continues to have an excellent response to minimal GCSF support.    She typically requires GCSF every other week to maintain an [place] >= 1500. No G-CSF today as ANC is > 1500. 3. Peristomal hernia: Patient notes plans for surgery. Patient has met with Dr. Windell Moment and per her report is being referred to [facility] for surgery. Anticipate GCSF pre and post surgery support to increase ANC per surgery goal and avoid infection. Anticipate coordinating care with surgery. 4. Discuss transition to [place].  Patient wishes to stay in [place].  Patient will be seen  by Dr. Rogue Bussing. 5. RTC on Tuesdays for labs (CBC with diff) and +/- GCSF for ANC < 1500. 6. RTC in 4 weeks for MD assessment (Dr Rogue Bussing), labs (CBC with diff, CMP), and +/- GCSF.    Honor Loh, NP  06/25/18, 10:42 AM   I saw and evaluated the patient, participating in the key portions  of the service and reviewing pertinent diagnostic studies and records.  I reviewed the nurse practitioner's note and agree with the findings and the plan.  The assessment and plan were discussed with the patient.  Several questions were asked by the patient and answered.   Nolon Stalls, MD 06/25/18, 10:42 AM

## 2018-06-25 ENCOUNTER — Encounter: Payer: Self-pay | Admitting: Hematology and Oncology

## 2018-06-25 ENCOUNTER — Inpatient Hospital Stay: Payer: Medicare Other

## 2018-06-25 ENCOUNTER — Inpatient Hospital Stay: Payer: Medicare Other | Admitting: Hematology and Oncology

## 2018-06-25 VITALS — BP 169/79 | HR 83 | Temp 98.1°F | Resp 18 | Ht 65.0 in | Wt 160.7 lb

## 2018-06-25 DIAGNOSIS — Z79899 Other long term (current) drug therapy: Secondary | ICD-10-CM

## 2018-06-25 DIAGNOSIS — Z85828 Personal history of other malignant neoplasm of skin: Secondary | ICD-10-CM

## 2018-06-25 DIAGNOSIS — D708 Other neutropenia: Secondary | ICD-10-CM

## 2018-06-25 DIAGNOSIS — Z853 Personal history of malignant neoplasm of breast: Secondary | ICD-10-CM

## 2018-06-25 DIAGNOSIS — Z87891 Personal history of nicotine dependence: Secondary | ICD-10-CM

## 2018-06-25 DIAGNOSIS — Z7952 Long term (current) use of systemic steroids: Secondary | ICD-10-CM | POA: Diagnosis not present

## 2018-06-25 DIAGNOSIS — Z7982 Long term (current) use of aspirin: Secondary | ICD-10-CM | POA: Diagnosis not present

## 2018-06-25 DIAGNOSIS — Z933 Colostomy status: Secondary | ICD-10-CM

## 2018-06-25 DIAGNOSIS — Z800 Family history of malignant neoplasm of digestive organs: Secondary | ICD-10-CM

## 2018-06-25 DIAGNOSIS — D709 Neutropenia, unspecified: Secondary | ICD-10-CM | POA: Diagnosis not present

## 2018-06-25 LAB — COMPREHENSIVE METABOLIC PANEL
ALT: 18 U/L (ref 0–44)
AST: 28 U/L (ref 15–41)
Albumin: 4.3 g/dL (ref 3.5–5.0)
Alkaline Phosphatase: 118 U/L (ref 38–126)
Anion gap: 11 (ref 5–15)
BUN: 15 mg/dL (ref 8–23)
CO2: 27 mmol/L (ref 22–32)
Calcium: 9.4 mg/dL (ref 8.9–10.3)
Chloride: 104 mmol/L (ref 98–111)
Creatinine, Ser: 0.92 mg/dL (ref 0.44–1.00)
GFR calc Af Amer: >60 mL/min (ref >60)
GFR calc non Af Amer: 58 mL/min — ABNORMAL LOW (ref >60)
Glucose, Bld: 110 mg/dL — ABNORMAL HIGH (ref 70–99)
Potassium: 4.6 mmol/L (ref 3.5–5.1)
Sodium: 142 mmol/L (ref 135–145)
Total Bilirubin: 1 mg/dL (ref 0.3–1.2)
Total Protein: 7.9 g/dL (ref 6.5–8.1)

## 2018-06-25 LAB — CBC WITH DIFFERENTIAL/PLATELET
Abs Immature Granulocytes: 0 10*3/uL (ref 0.00–0.07)
Basophils Absolute: 0 10*3/uL (ref 0.0–0.1)
Basophils Relative: 0 %
Eosinophils Absolute: 0 10*3/uL (ref 0.0–0.5)
Eosinophils Relative: 1 %
HCT: 46.1 % — ABNORMAL HIGH (ref 36.0–46.0)
Hemoglobin: 14.2 g/dL (ref 12.0–15.0)
Immature Granulocytes: 0 %
Lymphocytes Relative: 31 %
Lymphs Abs: 0.8 10*3/uL (ref 0.7–4.0)
MCH: 28.5 pg (ref 26.0–34.0)
MCHC: 30.8 g/dL (ref 30.0–36.0)
MCV: 92.6 fL (ref 80.0–100.0)
Monocytes Absolute: 0.2 10*3/uL (ref 0.1–1.0)
Monocytes Relative: 7 %
Neutro Abs: 1.5 10*3/uL — ABNORMAL LOW (ref 1.7–7.7)
Neutrophils Relative %: 61 %
Platelets: 224 10*3/uL (ref 150–400)
RBC: 4.98 MIL/uL (ref 3.87–5.11)
RDW: 14.4 % (ref 11.5–15.5)
WBC: 2.5 10*3/uL — ABNORMAL LOW (ref 4.0–10.5)
nRBC: 0 % (ref 0.0–0.2)

## 2018-06-25 NOTE — Progress Notes (Signed)
No new changes noted today 

## 2018-06-27 ENCOUNTER — Other Ambulatory Visit: Payer: Self-pay | Admitting: Obstetrics and Gynecology

## 2018-06-27 DIAGNOSIS — R928 Other abnormal and inconclusive findings on diagnostic imaging of breast: Secondary | ICD-10-CM

## 2018-07-02 ENCOUNTER — Inpatient Hospital Stay: Payer: Medicare Other | Attending: Internal Medicine

## 2018-07-02 ENCOUNTER — Inpatient Hospital Stay: Payer: Medicare Other

## 2018-07-02 DIAGNOSIS — K435 Parastomal hernia without obstruction or  gangrene: Secondary | ICD-10-CM | POA: Diagnosis not present

## 2018-07-02 DIAGNOSIS — Z853 Personal history of malignant neoplasm of breast: Secondary | ICD-10-CM | POA: Diagnosis not present

## 2018-07-02 DIAGNOSIS — D708 Other neutropenia: Secondary | ICD-10-CM

## 2018-07-02 DIAGNOSIS — D709 Neutropenia, unspecified: Secondary | ICD-10-CM | POA: Insufficient documentation

## 2018-07-02 DIAGNOSIS — Z79899 Other long term (current) drug therapy: Secondary | ICD-10-CM | POA: Diagnosis not present

## 2018-07-02 DIAGNOSIS — D72819 Decreased white blood cell count, unspecified: Secondary | ICD-10-CM

## 2018-07-02 DIAGNOSIS — Z87891 Personal history of nicotine dependence: Secondary | ICD-10-CM | POA: Insufficient documentation

## 2018-07-02 DIAGNOSIS — Z7982 Long term (current) use of aspirin: Secondary | ICD-10-CM | POA: Insufficient documentation

## 2018-07-02 LAB — CBC WITH DIFFERENTIAL/PLATELET
Abs Immature Granulocytes: 0.05 10*3/uL (ref 0.00–0.07)
Basophils Absolute: 0 10*3/uL (ref 0.0–0.1)
Basophils Relative: 0 %
Eosinophils Absolute: 0 10*3/uL (ref 0.0–0.5)
Eosinophils Relative: 2 %
HCT: 43.9 % (ref 36.0–46.0)
Hemoglobin: 13.4 g/dL (ref 12.0–15.0)
Immature Granulocytes: 2 %
Lymphocytes Relative: 41 %
Lymphs Abs: 1.1 10*3/uL (ref 0.7–4.0)
MCH: 28.2 pg (ref 26.0–34.0)
MCHC: 30.5 g/dL (ref 30.0–36.0)
MCV: 92.2 fL (ref 80.0–100.0)
Monocytes Absolute: 0.3 10*3/uL (ref 0.1–1.0)
Monocytes Relative: 11 %
Neutro Abs: 1.2 10*3/uL — ABNORMAL LOW (ref 1.7–7.7)
Neutrophils Relative %: 44 %
Platelets: 198 10*3/uL (ref 150–400)
RBC: 4.76 MIL/uL (ref 3.87–5.11)
RDW: 14.4 % (ref 11.5–15.5)
WBC: 2.6 10*3/uL — ABNORMAL LOW (ref 4.0–10.5)
nRBC: 0 % (ref 0.0–0.2)

## 2018-07-02 MED ORDER — TBO-FILGRASTIM 480 MCG/0.8ML ~~LOC~~ SOSY
300.0000 ug | PREFILLED_SYRINGE | Freq: Once | SUBCUTANEOUS | Status: AC
  Administered 2018-07-02: 300 ug via SUBCUTANEOUS

## 2018-07-08 ENCOUNTER — Ambulatory Visit
Admission: RE | Admit: 2018-07-08 | Discharge: 2018-07-08 | Disposition: A | Discharge status: Home / Self Care | Payer: Medicare Other | Source: Ambulatory Visit | Attending: Obstetrics and Gynecology | Admitting: Obstetrics and Gynecology

## 2018-07-08 ENCOUNTER — Ambulatory Visit: Payer: Medicare Other

## 2018-07-08 ENCOUNTER — Other Ambulatory Visit: Payer: Self-pay | Admitting: Obstetrics and Gynecology

## 2018-07-08 DIAGNOSIS — R928 Other abnormal and inconclusive findings on diagnostic imaging of breast: Secondary | ICD-10-CM

## 2018-07-09 ENCOUNTER — Inpatient Hospital Stay: Payer: Medicare Other

## 2018-07-09 DIAGNOSIS — D709 Neutropenia, unspecified: Secondary | ICD-10-CM | POA: Diagnosis not present

## 2018-07-09 DIAGNOSIS — D708 Other neutropenia: Secondary | ICD-10-CM

## 2018-07-09 DIAGNOSIS — D72819 Decreased white blood cell count, unspecified: Secondary | ICD-10-CM

## 2018-07-09 LAB — CBC WITH DIFFERENTIAL/PLATELET
Abs Immature Granulocytes: 0.28 10*3/uL — ABNORMAL HIGH (ref 0.00–0.07)
Basophils Absolute: 0 10*3/uL (ref 0.0–0.1)
Basophils Relative: 0 %
Eosinophils Absolute: 0 10*3/uL (ref 0.0–0.5)
Eosinophils Relative: 1 %
HCT: 43.4 % (ref 36.0–46.0)
Hemoglobin: 13.1 g/dL (ref 12.0–15.0)
Immature Granulocytes: 10 %
Lymphocytes Relative: 25 %
Lymphs Abs: 0.7 10*3/uL (ref 0.7–4.0)
MCH: 27.9 pg (ref 26.0–34.0)
MCHC: 30.2 g/dL (ref 30.0–36.0)
MCV: 92.5 fL (ref 80.0–100.0)
Monocytes Absolute: 0.2 10*3/uL (ref 0.1–1.0)
Monocytes Relative: 7 %
Neutro Abs: 1.6 10*3/uL — ABNORMAL LOW (ref 1.7–7.7)
Neutrophils Relative %: 57 %
Platelets: 193 10*3/uL (ref 150–400)
RBC: 4.69 MIL/uL (ref 3.87–5.11)
RDW: 14.8 % (ref 11.5–15.5)
WBC: 2.8 10*3/uL — ABNORMAL LOW (ref 4.0–10.5)
nRBC: 0 % (ref 0.0–0.2)

## 2018-07-09 MED ORDER — TBO-FILGRASTIM 480 MCG/0.8ML ~~LOC~~ SOSY
300.0000 ug | PREFILLED_SYRINGE | Freq: Once | SUBCUTANEOUS | Status: AC
  Administered 2018-07-09: 300 ug via SUBCUTANEOUS

## 2018-07-16 ENCOUNTER — Inpatient Hospital Stay: Payer: Medicare Other | Attending: Internal Medicine

## 2018-07-16 ENCOUNTER — Inpatient Hospital Stay: Payer: Medicare Other

## 2018-07-16 DIAGNOSIS — D709 Neutropenia, unspecified: Secondary | ICD-10-CM | POA: Diagnosis not present

## 2018-07-16 DIAGNOSIS — Z79899 Other long term (current) drug therapy: Secondary | ICD-10-CM | POA: Diagnosis not present

## 2018-07-16 DIAGNOSIS — D708 Other neutropenia: Secondary | ICD-10-CM

## 2018-07-16 DIAGNOSIS — D72819 Decreased white blood cell count, unspecified: Secondary | ICD-10-CM

## 2018-07-16 LAB — CBC WITH DIFFERENTIAL/PLATELET
Abs Immature Granulocytes: 0.02 10*3/uL (ref 0.00–0.07)
Basophils Absolute: 0 10*3/uL (ref 0.0–0.1)
Basophils Relative: 0 %
Eosinophils Absolute: 0 10*3/uL (ref 0.0–0.5)
Eosinophils Relative: 1 %
HCT: 46.2 % — ABNORMAL HIGH (ref 36.0–46.0)
Hemoglobin: 14.4 g/dL (ref 12.0–15.0)
Immature Granulocytes: 1 %
Lymphocytes Relative: 48 %
Lymphs Abs: 1.4 10*3/uL (ref 0.7–4.0)
MCH: 28.7 pg (ref 26.0–34.0)
MCHC: 31.2 g/dL (ref 30.0–36.0)
MCV: 92.2 fL (ref 80.0–100.0)
Monocytes Absolute: 0.4 10*3/uL (ref 0.1–1.0)
Monocytes Relative: 14 %
Neutro Abs: 1 10*3/uL — ABNORMAL LOW (ref 1.7–7.7)
Neutrophils Relative %: 36 %
Platelets: 234 10*3/uL (ref 150–400)
RBC: 5.01 MIL/uL (ref 3.87–5.11)
RDW: 14.9 % (ref 11.5–15.5)
WBC: 2.8 10*3/uL — ABNORMAL LOW (ref 4.0–10.5)
nRBC: 0 % (ref 0.0–0.2)

## 2018-07-16 MED ORDER — TBO-FILGRASTIM 480 MCG/0.8ML ~~LOC~~ SOSY
300.0000 ug | PREFILLED_SYRINGE | Freq: Once | SUBCUTANEOUS | Status: AC
  Administered 2018-07-16: 300 ug via SUBCUTANEOUS

## 2018-07-17 ENCOUNTER — Other Ambulatory Visit: Payer: Self-pay

## 2018-07-17 ENCOUNTER — Inpatient Hospital Stay: Payer: Medicare Other

## 2018-07-17 ENCOUNTER — Encounter: Payer: Self-pay | Admitting: Internal Medicine

## 2018-07-17 ENCOUNTER — Inpatient Hospital Stay (HOSPITAL_BASED_OUTPATIENT_CLINIC_OR_DEPARTMENT_OTHER): Payer: Medicare Other | Admitting: Internal Medicine

## 2018-07-17 DIAGNOSIS — D72819 Decreased white blood cell count, unspecified: Secondary | ICD-10-CM

## 2018-07-17 DIAGNOSIS — D709 Neutropenia, unspecified: Secondary | ICD-10-CM | POA: Diagnosis not present

## 2018-07-17 DIAGNOSIS — Z79899 Other long term (current) drug therapy: Secondary | ICD-10-CM | POA: Diagnosis not present

## 2018-07-17 DIAGNOSIS — Z7982 Long term (current) use of aspirin: Secondary | ICD-10-CM

## 2018-07-17 DIAGNOSIS — D708 Other neutropenia: Secondary | ICD-10-CM | POA: Insufficient documentation

## 2018-07-17 DIAGNOSIS — K435 Parastomal hernia without obstruction or  gangrene: Secondary | ICD-10-CM

## 2018-07-17 DIAGNOSIS — Z87891 Personal history of nicotine dependence: Secondary | ICD-10-CM

## 2018-07-17 DIAGNOSIS — Z853 Personal history of malignant neoplasm of breast: Secondary | ICD-10-CM

## 2018-07-17 MED ORDER — TBO-FILGRASTIM 480 MCG/0.8ML ~~LOC~~ SOSY
300.0000 ug | PREFILLED_SYRINGE | Freq: Once | SUBCUTANEOUS | Status: AC
  Administered 2018-07-17: 300 ug via SUBCUTANEOUS

## 2018-07-17 NOTE — Progress Notes (Signed)
[place] NOTE  Patient Care Team: Baxter Hire, MD as PCP - General (Internal Medicine) Emmaline Kluver., MD (Rheumatology) Dasher, Rayvon Char, MD (Dermatology)  CHIEF COMPLAINTS/PURPOSE OF CONSULTATION: Autoimmune neutropenia  #Autoimmune neutropenia-weekly Granix/CBC [Dr.Crocoran]; BMBx- no malignancy [Bone marrow aspirate and biopsy on 09/04/2017 revealed a hypercellular marrow for age with trilineage hematopoiesis.  There was abundant mature neutrophils.  Significant dyspoiesis or increased blasts was not identified.  Flow cytometry revealed a predominance of T lymphocytes (16% of all cells) with no abnormal phenotype.  There was a minor B-cell population (13% of lymphocytes) with slight kappa light chain excess.  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   #   temporal arteritis [2003] chronic prednisone/ 2.5 mg a day.  No history exists.   This is my first interaction with the patient as patient's primary oncologist has been [place]. I reviewed the patient's prior charts/pertinent labs/imaging in detail; findings are summarized above.    HISTORY OF PRESENTING ILLNESS:  [name] 84 y.o.  female upper history of autoimmune neutropenia is here for follow-up/anxious about her upcoming surgery for parastomal hernia/possible takedown of colostomy.  Patient has been evaluated at [facility] above mentioned surgery plan on February 21st.   She last had an injection of Granix yesterday on 2/18th.   Patient has not had any fevers or chills.  No nausea no vomiting.  Anxious.   Review of Systems  Constitutional: Negative for chills, diaphoresis, fever, malaise/fatigue and weight loss.  HENT: Negative for nosebleeds and sore throat.   Eyes: Negative for double vision.  Respiratory: Negative for cough, hemoptysis, sputum production, shortness of breath and wheezing.   Cardiovascular: Negative for  chest pain, palpitations, orthopnea and leg swelling.  Gastrointestinal: Positive for abdominal pain. Negative for blood in stool, constipation, diarrhea, heartburn, melena, nausea and vomiting.       Around parastomal hernia.  Genitourinary: Negative for dysuria, frequency and urgency.  Musculoskeletal: Negative for back pain and joint pain.  Skin: Negative.  Negative for itching and rash.  Neurological: Negative for dizziness, tingling, focal weakness, weakness and headaches.  Endo/Heme/Allergies: Does not bruise/bleed easily.  Psychiatric/Behavioral: Negative for depression. The patient is nervous/anxious. The patient does not have insomnia.      MEDICAL HISTORY:  Past Medical History:  Diagnosis Date  . Breast cancer ([place]) 1985   bilateral mastectomies  . Depression    1 time specific situation that she did not know how to deal wiht  . Diverticulosis   . Hiatal hernia   . History of Bell's palsy   . History of colon polyps   . History of nephrolithiasis   . History of pancreatitis   . Hyperlipidemia   . IBS (irritable bowel syndrome)   . Osteoporosis    osteopenia in neck  . Skin cancer   . Temporal arteritis ([place])     SURGICAL HISTORY: Past Surgical History:  Procedure Laterality Date  . AUGMENTATION MAMMAPLASTY Bilateral 2000  . COLON RESECTION SIGMOID N/A 10/13/2017   Procedure: COLON RESECTION SIGMOID;  Surgeon: Herbert Pun, MD;  Location: [facility] ORS;  Service: General;  Laterality: N/A;  . COLOSTOMY N/A 10/13/2017   Procedure: COLOSTOMY;  Surgeon: Herbert Pun, MD;  Location: [facility] ORS;  Service: General;  Laterality: N/A;  . LAPAROSCOPIC CHOLECYSTECTOMY  2009  . MASTECTOMY  1983   bilateral    SOCIAL HISTORY: Social History   Socioeconomic History  . Marital status: Widowed  Spouse name: Not on file  . Number of children: 2  . Years of education: college  . Highest education level: Not on file  Occupational History  . Occupation:  Engineer, production  . Occupation: Retired  Scientific laboratory technician  . Financial resource strain: Not on file  . Food insecurity:    Worry: Not on file    Inability: Not on file  . Transportation needs:    Medical: Not on file    Non-medical: Not on file  Tobacco Use  . Smoking status: Former Smoker    Packs/day: 0.25    Years: 30.00    Pack years: 7.50    Types: Cigarettes    Last attempt to quit: 05/17/2005    Years since quitting: 13.1  . Smokeless tobacco: Never Used  . Tobacco comment: quit 11 years ago  Substance and Sexual Activity  . Alcohol use: No    Alcohol/week: 0.0 standard drinks  . Drug use: No  . Sexual activity: Not Currently  Lifestyle  . Physical activity:    Days per week: Not on file    Minutes per session: Not on file  . Stress: Not on file  Relationships  . Social connections:    Talks on phone: Not on file    Gets together: Not on file    Attends religious service: Not on file    Active member of club or organization: Not on file    Attends meetings of clubs or organizations: Not on file    Relationship status: Not on file  . Intimate partner violence:    Fear of current or ex partner: Not on file    Emotionally abused: Not on file    Physically abused: Not on file    Forced sexual activity: Not on file  Other Topics Concern  . Not on file  Social History Narrative   Patient lives at home alone.    Patient is retired.    Patient has some college.    Patient has 2 children.    FAMILY HISTORY: Family History  Problem Relation Age of Onset  . Colon cancer Mother 74  . Scleroderma Father 53  . Alcoholism Brother   . Stomach cancer Neg Hx     ALLERGIES:  is allergic to benzalkonium chloride; escitalopram; and neomycin-bacitracin zn-polymyx.  MEDICATIONS:  Current Outpatient Medications  Medication Sig Dispense Refill  . ALPRAZolam (XANAX) 0.25 MG tablet Take 0.25 mg by mouth 2 (two) times daily as needed.     Marland Kitchen aspirin EC 81 MG tablet Take 81 mg  daily by mouth.    . Biotin 1000 MCG tablet Take 1,000 mcg by mouth daily.     . Cholecalciferol (VITAMIN D3) 2000 units capsule Take 2,000 Units daily by mouth.     . Cyanocobalamin (B-12 PO) Take 2,500 mcg daily by mouth.     . Ostomy Supplies (COLOPLAST SKIN BARRIER) WAFR Use 1 each every other day    . Ostomy Supplies (PREMIER DRAINABLE) Pouch MISC Use 1 Bag every other day    . Ostomy Supplies (SKIN PREP WIPES) MISC Use 1 each every other day    . predniSONE (DELTASONE) 5 MG tablet Take 2.5 mg by mouth daily.     . traMADol (ULTRAM) 50 MG tablet Take 50 mg by mouth daily.      No current facility-administered medications for this visit.       Marland Kitchen  PHYSICAL EXAMINATION: ECOG PERFORMANCE STATUS: 0 - Asymptomatic  Vitals:  07/17/18 1023  BP: (!) 152/78  Pulse: 99  Resp: 18  Temp: 97.8 F (36.6 C)   Filed Weights   07/17/18 1023  Weight: 160 lb (72.6 kg)    Physical Exam  Constitutional: She is oriented to person, place, and time and well-developed, well-nourished, and in no distress.  Alone.  HENT:  Head: Normocephalic and atraumatic.  Mouth/Throat: Oropharynx is clear and moist. No oropharyngeal exudate.  Eyes: Pupils are equal, round, and reactive to light.  Neck: Normal range of motion. Neck supple.  Cardiovascular: Normal rate and regular rhythm.  Pulmonary/Chest: No respiratory distress. She has no wheezes.  Abdominal: Soft. Bowel sounds are normal. She exhibits no distension and no mass. There is no abdominal tenderness. There is no rebound and no guarding.  Colostomy.  Parastomal hernia.  Musculoskeletal: Normal range of motion.        General: No tenderness or edema.  Neurological: She is alert and oriented to person, place, and time.  Skin: Skin is warm.  Psychiatric: Affect normal.  Anxious.     LABORATORY DATA:  I have reviewed the data as listed Lab Results  Component Value Date   WBC 2.8 (L) 07/16/2018   HGB 14.4 07/16/2018   HCT 46.2 (H)  07/16/2018   MCV 92.2 07/16/2018   PLT 234 07/16/2018   Recent Labs    04/02/18 1104 05/14/18 0935 06/25/18 0943  NA 136 141 142  K 4.1 4.1 4.6  CL 102 103 104  CO2 27 27 27   GLUCOSE 114* 100* 110*  BUN 18 18 15   CREATININE 0.85 0.93 0.92  CALCIUM 9.0 9.5 9.4  GFRNONAA >60 58* 58*  GFRAA >60 >60 >60  PROT 7.2 7.5 7.9  ALBUMIN 4.0 4.2 4.3  AST 21 23 28   ALT 13 16 18   ALKPHOS 120 126 118  BILITOT 1.2 1.0 1.0    RADIOGRAPHIC STUDIES: I have personally reviewed the radiological images as listed and agreed with the findings in the report. Mm Diag Breast Tomo Uni Right  Result Date: 07/08/2018 CLINICAL DATA:  84 year old patient with history of bilateral subcutaneous nipple sparing mastectomies in bilateral breast implants had a recent screening mammogram with possible mass identified in the right breast on the MLO view. EXAM: DIGITAL DIAGNOSTIC UNILATERAL RIGHT MAMMOGRAM WITH CAD AND TOMO COMPARISON:  June 25, 2018 ACR Breast Density Category b: There are scattered areas of fibroglandular density. FINDINGS: Spot compression views of the superior right breast with tomography, with the implant displaced, show no persistent mass. Given history of mastectomy, only a small amount of subcutaneous tissue is present. Multiple skin folds are seen. 90 degree lateral view of the right breast is negative. Mammographic images were processed with CAD. IMPRESSION: No evidence of malignancy in the right breast. Possible mass identified on recent screening mammogram was likely related to overlapping skin folds. RECOMMENDATION: Screening mammogram in one year.(Code:SM-B-01Y) I have discussed the findings and recommendations with the patient. Results were also provided in writing at the conclusion of the visit. If applicable, a reminder letter will be sent to the patient regarding the next appointment. BI-RADS CATEGORY  1: Negative. Electronically Signed   By: Curlene Dolphin M.D.   On: 07/08/2018 11:24     ASSESSMENT & PLAN:   Autoimmune neutropenia (HCC) # Autoimmune neutropenia- once a week- if ANC < 1.5- granix; patient's [place] on February 18 1.2.  Patient has had Granix yesterday February 18.  Proceed with Granix today.  Hopefully this will keep the [place]  above 1.5 for the upcoming surgery on February 21.   # parastomal hernia on 2/21st-   #I left a message for patient's surgeon Dr. Audie Clear to discuss regarding the patient's hematologic situation.  Patient is also asked to call surgeon's office to inquire about the inclement weather.   # 25 minutes face-to-face with the patient discussing the above plan of care; more than 50% of time spent on prognosis/ natural history; counseling and coordination.   # DISPOSITION: # granix today # follow up with MD on 2/25- cbc/granix-Dr.B  All questions were answered. The patient knows to call the clinic with any problems, questions or concerns.    Cammie Sickle, MD 07/17/2018 9:42 PM

## 2018-07-17 NOTE — Progress Notes (Signed)
Low white count, no fevers and no infections that she knows of

## 2018-07-17 NOTE — Assessment & Plan Note (Deleted)
#   Neutropenia- Gaston Islam

## 2018-07-17 NOTE — Assessment & Plan Note (Addendum)
#   Autoimmune neutropenia- once a week- if ANC < 1.5- granix; patient's [place] on February 18 1.2.  Patient has had Granix yesterday February 18.  Proceed with Granix today.  Hopefully this will keep the [place] above 1.5 for the upcoming surgery on February 21.   # parastomal hernia on 2/21st-   #I left a message for patient's surgeon Dr. Audie Clear to discuss regarding the patient's hematologic situation.  Patient is also asked to call surgeon's office to inquire about the inclement weather.   # 25 minutes face-to-face with the patient discussing the above plan of care; more than 50% of time spent on prognosis/ natural history; counseling and coordination.   # DISPOSITION: # granix today # follow up with MD on 2/25- cbc/granix-Dr.B

## 2018-07-23 ENCOUNTER — Inpatient Hospital Stay: Payer: Medicare Other

## 2018-07-23 ENCOUNTER — Inpatient Hospital Stay: Payer: Medicare Other | Admitting: Internal Medicine

## 2018-08-19 ENCOUNTER — Telehealth: Payer: Self-pay | Admitting: *Deleted

## 2018-08-19 ENCOUNTER — Other Ambulatory Visit: Payer: Self-pay

## 2018-08-19 ENCOUNTER — Inpatient Hospital Stay: Payer: Medicare Other | Attending: Internal Medicine

## 2018-08-19 DIAGNOSIS — D72819 Decreased white blood cell count, unspecified: Secondary | ICD-10-CM | POA: Insufficient documentation

## 2018-08-19 DIAGNOSIS — D708 Other neutropenia: Secondary | ICD-10-CM

## 2018-08-19 LAB — CBC WITH DIFFERENTIAL/PLATELET
Abs Immature Granulocytes: 0.02 10*3/uL (ref 0.00–0.07)
Basophils Absolute: 0 10*3/uL (ref 0.0–0.1)
Basophils Relative: 0 %
Eosinophils Absolute: 0 10*3/uL (ref 0.0–0.5)
Eosinophils Relative: 0 %
HCT: 43.2 % (ref 36.0–46.0)
Hemoglobin: 13.3 g/dL (ref 12.0–15.0)
Immature Granulocytes: 1 %
Lymphocytes Relative: 34 %
Lymphs Abs: 1 10*3/uL (ref 0.7–4.0)
MCH: 29 pg (ref 26.0–34.0)
MCHC: 30.8 g/dL (ref 30.0–36.0)
MCV: 94.3 fL (ref 80.0–100.0)
Monocytes Absolute: 0.2 10*3/uL (ref 0.1–1.0)
Monocytes Relative: 7 %
Neutro Abs: 1.6 10*3/uL — ABNORMAL LOW (ref 1.7–7.7)
Neutrophils Relative %: 58 %
PLATELETS: 213 10*3/uL (ref 150–400)
RBC: 4.58 MIL/uL (ref 3.87–5.11)
RDW: 14.9 % (ref 11.5–15.5)
SMEAR REVIEW: NORMAL
WBC: 2.8 10*3/uL — ABNORMAL LOW (ref 4.0–10.5)
nRBC: 0 % (ref 0.0–0.2)

## 2018-08-19 NOTE — Telephone Encounter (Signed)
Daughter called reporting that patient is 5 weeks post op and has been told that patient cannot get appointment for her neutropenia. Concern regarding her possibility for infection. Discussed with Dr B and he has ordered a CBC be drawn and then we will go from there

## 2018-08-20 ENCOUNTER — Other Ambulatory Visit: Payer: Self-pay | Admitting: Internal Medicine

## 2018-08-20 ENCOUNTER — Telehealth: Payer: Self-pay | Admitting: Internal Medicine

## 2018-08-20 DIAGNOSIS — D708 Other neutropenia: Secondary | ICD-10-CM

## 2018-08-20 NOTE — Addendum Note (Signed)
Addended by: Sabino Gasser on: 08/20/2018 02:43 PM   Modules accepted: Orders

## 2018-08-20 NOTE — Progress Notes (Signed)
x

## 2018-08-20 NOTE — Telephone Encounter (Signed)
I Spoke to patient regarding results of absolute neutrophil count of 1.6; hold Granix.  #Recommend checking CBC with differential weekly/Tuesdays x 8; possibly Granix  #Follow-up with me in 8 weeks/CBC/possible Granix  Thanks, GB

## 2018-08-27 ENCOUNTER — Inpatient Hospital Stay: Payer: Medicare Other

## 2018-08-27 ENCOUNTER — Other Ambulatory Visit: Payer: Self-pay

## 2018-08-27 DIAGNOSIS — D72819 Decreased white blood cell count, unspecified: Secondary | ICD-10-CM | POA: Diagnosis not present

## 2018-08-27 DIAGNOSIS — D708 Other neutropenia: Secondary | ICD-10-CM

## 2018-08-27 LAB — CBC WITH DIFFERENTIAL/PLATELET
Abs Immature Granulocytes: 0.01 10*3/uL (ref 0.00–0.07)
Basophils Absolute: 0 10*3/uL (ref 0.0–0.1)
Basophils Relative: 1 %
Eosinophils Absolute: 0 10*3/uL (ref 0.0–0.5)
Eosinophils Relative: 1 %
HCT: 41 % (ref 36.0–46.0)
Hemoglobin: 12.5 g/dL (ref 12.0–15.0)
Immature Granulocytes: 1 %
Lymphocytes Relative: 28 %
Lymphs Abs: 0.5 10*3/uL — ABNORMAL LOW (ref 0.7–4.0)
MCH: 28.9 pg (ref 26.0–34.0)
MCHC: 30.5 g/dL (ref 30.0–36.0)
MCV: 94.7 fL (ref 80.0–100.0)
Monocytes Absolute: 0.2 10*3/uL (ref 0.1–1.0)
Monocytes Relative: 9 %
Neutro Abs: 1.2 10*3/uL — ABNORMAL LOW (ref 1.7–7.7)
Neutrophils Relative %: 60 %
Platelets: 216 10*3/uL (ref 150–400)
RBC: 4.33 MIL/uL (ref 3.87–5.11)
RDW: 14.9 % (ref 11.5–15.5)
WBC: 2 10*3/uL — ABNORMAL LOW (ref 4.0–10.5)
nRBC: 0 % (ref 0.0–0.2)

## 2018-08-27 MED ORDER — TBO-FILGRASTIM 480 MCG/0.8ML ~~LOC~~ SOSY
300.0000 ug | PREFILLED_SYRINGE | Freq: Once | SUBCUTANEOUS | Status: AC
  Administered 2018-08-27: 300 ug via SUBCUTANEOUS

## 2018-09-02 ENCOUNTER — Other Ambulatory Visit: Payer: Self-pay

## 2018-09-03 ENCOUNTER — Other Ambulatory Visit: Payer: Self-pay

## 2018-09-03 ENCOUNTER — Inpatient Hospital Stay: Payer: Medicare Other | Attending: Internal Medicine

## 2018-09-03 ENCOUNTER — Inpatient Hospital Stay: Payer: Medicare Other

## 2018-09-03 DIAGNOSIS — D72819 Decreased white blood cell count, unspecified: Secondary | ICD-10-CM

## 2018-09-03 DIAGNOSIS — M316 Other giant cell arteritis: Secondary | ICD-10-CM | POA: Insufficient documentation

## 2018-09-03 DIAGNOSIS — D708 Other neutropenia: Secondary | ICD-10-CM | POA: Diagnosis present

## 2018-09-03 LAB — CBC WITH DIFFERENTIAL/PLATELET
Abs Immature Granulocytes: 0.06 10*3/uL (ref 0.00–0.07)
Basophils Absolute: 0 10*3/uL (ref 0.0–0.1)
Basophils Relative: 0 %
Eosinophils Absolute: 0 10*3/uL (ref 0.0–0.5)
Eosinophils Relative: 1 %
HCT: 40.9 % (ref 36.0–46.0)
Hemoglobin: 12.6 g/dL (ref 12.0–15.0)
Immature Granulocytes: 2 %
Lymphocytes Relative: 35 %
Lymphs Abs: 0.9 10*3/uL (ref 0.7–4.0)
MCH: 28.6 pg (ref 26.0–34.0)
MCHC: 30.8 g/dL (ref 30.0–36.0)
MCV: 93 fL (ref 80.0–100.0)
Monocytes Absolute: 0.2 10*3/uL (ref 0.1–1.0)
Monocytes Relative: 8 %
Neutro Abs: 1.4 10*3/uL — ABNORMAL LOW (ref 1.7–7.7)
Neutrophils Relative %: 54 %
Platelets: 259 10*3/uL (ref 150–400)
RBC: 4.4 MIL/uL (ref 3.87–5.11)
RDW: 14.6 % (ref 11.5–15.5)
WBC: 2.5 10*3/uL — ABNORMAL LOW (ref 4.0–10.5)
nRBC: 0 % (ref 0.0–0.2)

## 2018-09-03 MED ORDER — TBO-FILGRASTIM 480 MCG/0.8ML ~~LOC~~ SOSY
300.0000 ug | PREFILLED_SYRINGE | Freq: Once | SUBCUTANEOUS | Status: AC
  Administered 2018-09-03: 300 ug via SUBCUTANEOUS

## 2018-09-10 ENCOUNTER — Other Ambulatory Visit: Payer: Self-pay

## 2018-09-10 ENCOUNTER — Inpatient Hospital Stay: Payer: Medicare Other

## 2018-09-10 ENCOUNTER — Telehealth: Payer: Self-pay | Admitting: Internal Medicine

## 2018-09-10 DIAGNOSIS — D708 Other neutropenia: Secondary | ICD-10-CM

## 2018-09-10 DIAGNOSIS — D72819 Decreased white blood cell count, unspecified: Secondary | ICD-10-CM

## 2018-09-10 DIAGNOSIS — M316 Other giant cell arteritis: Secondary | ICD-10-CM | POA: Diagnosis not present

## 2018-09-10 LAB — CBC WITH DIFFERENTIAL/PLATELET
Abs Immature Granulocytes: 0.07 10*3/uL (ref 0.00–0.07)
Basophils Absolute: 0 10*3/uL (ref 0.0–0.1)
Basophils Relative: 0 %
Eosinophils Absolute: 0 10*3/uL (ref 0.0–0.5)
Eosinophils Relative: 0 %
HCT: 40.8 % (ref 36.0–46.0)
Hemoglobin: 12.7 g/dL (ref 12.0–15.0)
Immature Granulocytes: 3 %
Lymphocytes Relative: 33 %
Lymphs Abs: 0.8 10*3/uL (ref 0.7–4.0)
MCH: 28.7 pg (ref 26.0–34.0)
MCHC: 31.1 g/dL (ref 30.0–36.0)
MCV: 92.3 fL (ref 80.0–100.0)
Monocytes Absolute: 0.2 10*3/uL (ref 0.1–1.0)
Monocytes Relative: 10 %
Neutro Abs: 1.3 10*3/uL — ABNORMAL LOW (ref 1.7–7.7)
Neutrophils Relative %: 54 %
Platelets: 238 10*3/uL (ref 150–400)
RBC: 4.42 MIL/uL (ref 3.87–5.11)
RDW: 14.8 % (ref 11.5–15.5)
Smear Review: ADEQUATE
WBC: 2.4 10*3/uL — ABNORMAL LOW (ref 4.0–10.5)
nRBC: 0 % (ref 0.0–0.2)

## 2018-09-10 MED ORDER — TBO-FILGRASTIM 480 MCG/0.8ML ~~LOC~~ SOSY
300.0000 ug | PREFILLED_SYRINGE | Freq: Once | SUBCUTANEOUS | Status: AC
  Administered 2018-09-10: 12:00:00 300 ug via SUBCUTANEOUS

## 2018-09-10 NOTE — Telephone Encounter (Signed)
Keep labs/shots as planned; change MD to Virtual Visit. Thx

## 2018-09-16 ENCOUNTER — Other Ambulatory Visit: Payer: Self-pay

## 2018-09-17 ENCOUNTER — Inpatient Hospital Stay: Payer: Medicare Other

## 2018-09-17 ENCOUNTER — Other Ambulatory Visit: Payer: Self-pay

## 2018-09-17 DIAGNOSIS — D72819 Decreased white blood cell count, unspecified: Secondary | ICD-10-CM

## 2018-09-17 DIAGNOSIS — D708 Other neutropenia: Secondary | ICD-10-CM

## 2018-09-17 DIAGNOSIS — M316 Other giant cell arteritis: Secondary | ICD-10-CM | POA: Diagnosis not present

## 2018-09-17 LAB — CBC WITH DIFFERENTIAL/PLATELET
Abs Immature Granulocytes: 0.01 10*3/uL (ref 0.00–0.07)
Basophils Absolute: 0 10*3/uL (ref 0.0–0.1)
Basophils Relative: 0 %
Eosinophils Absolute: 0 10*3/uL (ref 0.0–0.5)
Eosinophils Relative: 1 %
HCT: 41 % (ref 36.0–46.0)
Hemoglobin: 12.7 g/dL (ref 12.0–15.0)
Immature Granulocytes: 0 %
Lymphocytes Relative: 32 %
Lymphs Abs: 0.7 10*3/uL (ref 0.7–4.0)
MCH: 28.7 pg (ref 26.0–34.0)
MCHC: 31 g/dL (ref 30.0–36.0)
MCV: 92.8 fL (ref 80.0–100.0)
Monocytes Absolute: 0.2 10*3/uL (ref 0.1–1.0)
Monocytes Relative: 8 %
Neutro Abs: 1.3 10*3/uL — ABNORMAL LOW (ref 1.7–7.7)
Neutrophils Relative %: 59 %
Platelets: 228 10*3/uL (ref 150–400)
RBC: 4.42 MIL/uL (ref 3.87–5.11)
RDW: 14.6 % (ref 11.5–15.5)
WBC: 2.3 10*3/uL — ABNORMAL LOW (ref 4.0–10.5)
nRBC: 0 % (ref 0.0–0.2)

## 2018-09-17 MED ORDER — TBO-FILGRASTIM 480 MCG/0.8ML ~~LOC~~ SOSY
300.0000 ug | PREFILLED_SYRINGE | Freq: Once | SUBCUTANEOUS | Status: AC
  Administered 2018-09-17: 12:00:00 300 ug via SUBCUTANEOUS

## 2018-09-24 ENCOUNTER — Other Ambulatory Visit: Payer: Self-pay

## 2018-09-24 ENCOUNTER — Inpatient Hospital Stay: Payer: Medicare Other

## 2018-09-24 DIAGNOSIS — D708 Other neutropenia: Secondary | ICD-10-CM

## 2018-09-24 DIAGNOSIS — D72819 Decreased white blood cell count, unspecified: Secondary | ICD-10-CM

## 2018-09-24 DIAGNOSIS — M316 Other giant cell arteritis: Secondary | ICD-10-CM | POA: Diagnosis not present

## 2018-09-24 LAB — CBC WITH DIFFERENTIAL/PLATELET
Abs Immature Granulocytes: 0.08 10*3/uL — ABNORMAL HIGH (ref 0.00–0.07)
Basophils Absolute: 0 10*3/uL (ref 0.0–0.1)
Basophils Relative: 1 %
Eosinophils Absolute: 0 10*3/uL (ref 0.0–0.5)
Eosinophils Relative: 1 %
HCT: 43.3 % (ref 36.0–46.0)
Hemoglobin: 13.4 g/dL (ref 12.0–15.0)
Immature Granulocytes: 4 %
Lymphocytes Relative: 37 %
Lymphs Abs: 0.9 10*3/uL (ref 0.7–4.0)
MCH: 28.5 pg (ref 26.0–34.0)
MCHC: 30.9 g/dL (ref 30.0–36.0)
MCV: 92.1 fL (ref 80.0–100.0)
Monocytes Absolute: 0.2 10*3/uL (ref 0.1–1.0)
Monocytes Relative: 10 %
Neutro Abs: 1.1 10*3/uL — ABNORMAL LOW (ref 1.7–7.7)
Neutrophils Relative %: 47 %
Platelets: 252 10*3/uL (ref 150–400)
RBC: 4.7 MIL/uL (ref 3.87–5.11)
RDW: 14.6 % (ref 11.5–15.5)
WBC: 2.3 10*3/uL — ABNORMAL LOW (ref 4.0–10.5)
nRBC: 0 % (ref 0.0–0.2)

## 2018-09-24 MED ORDER — TBO-FILGRASTIM 480 MCG/0.8ML ~~LOC~~ SOSY
300.0000 ug | PREFILLED_SYRINGE | Freq: Once | SUBCUTANEOUS | Status: AC
  Administered 2018-09-24: 11:00:00 300 ug via SUBCUTANEOUS

## 2018-09-30 ENCOUNTER — Other Ambulatory Visit: Payer: Self-pay

## 2018-10-01 ENCOUNTER — Inpatient Hospital Stay: Payer: Medicare Other | Attending: Internal Medicine

## 2018-10-01 ENCOUNTER — Other Ambulatory Visit: Payer: Self-pay

## 2018-10-01 ENCOUNTER — Inpatient Hospital Stay: Payer: Medicare Other

## 2018-10-01 DIAGNOSIS — D709 Neutropenia, unspecified: Secondary | ICD-10-CM | POA: Insufficient documentation

## 2018-10-01 DIAGNOSIS — M316 Other giant cell arteritis: Secondary | ICD-10-CM | POA: Insufficient documentation

## 2018-10-01 DIAGNOSIS — D708 Other neutropenia: Secondary | ICD-10-CM

## 2018-10-01 LAB — CBC WITH DIFFERENTIAL/PLATELET
Abs Immature Granulocytes: 0.02 10*3/uL (ref 0.00–0.07)
Basophils Absolute: 0 10*3/uL (ref 0.0–0.1)
Basophils Relative: 0 %
Eosinophils Absolute: 0 10*3/uL (ref 0.0–0.5)
Eosinophils Relative: 1 %
HCT: 42.5 % (ref 36.0–46.0)
Hemoglobin: 13 g/dL (ref 12.0–15.0)
Immature Granulocytes: 1 %
Lymphocytes Relative: 32 %
Lymphs Abs: 0.9 10*3/uL (ref 0.7–4.0)
MCH: 28.3 pg (ref 26.0–34.0)
MCHC: 30.6 g/dL (ref 30.0–36.0)
MCV: 92.6 fL (ref 80.0–100.0)
Monocytes Absolute: 0.3 10*3/uL (ref 0.1–1.0)
Monocytes Relative: 11 %
Neutro Abs: 1.5 10*3/uL — ABNORMAL LOW (ref 1.7–7.7)
Neutrophils Relative %: 55 %
Platelets: 237 10*3/uL (ref 150–400)
RBC: 4.59 MIL/uL (ref 3.87–5.11)
RDW: 14.8 % (ref 11.5–15.5)
WBC: 2.8 10*3/uL — ABNORMAL LOW (ref 4.0–10.5)
nRBC: 0 % (ref 0.0–0.2)

## 2018-10-08 ENCOUNTER — Inpatient Hospital Stay: Payer: Medicare Other

## 2018-10-08 ENCOUNTER — Other Ambulatory Visit: Payer: Self-pay

## 2018-10-08 DIAGNOSIS — D708 Other neutropenia: Secondary | ICD-10-CM

## 2018-10-08 DIAGNOSIS — D709 Neutropenia, unspecified: Secondary | ICD-10-CM | POA: Diagnosis not present

## 2018-10-08 LAB — CBC WITH DIFFERENTIAL/PLATELET
Abs Immature Granulocytes: 0.04 10*3/uL (ref 0.00–0.07)
Basophils Absolute: 0 10*3/uL (ref 0.0–0.1)
Basophils Relative: 0 %
Eosinophils Absolute: 0 10*3/uL (ref 0.0–0.5)
Eosinophils Relative: 0 %
HCT: 41.3 % (ref 36.0–46.0)
Hemoglobin: 12.9 g/dL (ref 12.0–15.0)
Immature Granulocytes: 2 %
Lymphocytes Relative: 28 %
Lymphs Abs: 0.7 10*3/uL (ref 0.7–4.0)
MCH: 28.8 pg (ref 26.0–34.0)
MCHC: 31.2 g/dL (ref 30.0–36.0)
MCV: 92.2 fL (ref 80.0–100.0)
Monocytes Absolute: 0.2 10*3/uL (ref 0.1–1.0)
Monocytes Relative: 8 %
Neutro Abs: 1.5 10*3/uL — ABNORMAL LOW (ref 1.7–7.7)
Neutrophils Relative %: 62 %
Platelets: 240 10*3/uL (ref 150–400)
RBC: 4.48 MIL/uL (ref 3.87–5.11)
RDW: 14.7 % (ref 11.5–15.5)
WBC: 2.5 10*3/uL — ABNORMAL LOW (ref 4.0–10.5)
nRBC: 0 % (ref 0.0–0.2)

## 2018-10-14 ENCOUNTER — Other Ambulatory Visit: Payer: Self-pay

## 2018-10-15 ENCOUNTER — Other Ambulatory Visit: Payer: Self-pay

## 2018-10-15 ENCOUNTER — Inpatient Hospital Stay: Payer: Medicare Other

## 2018-10-15 ENCOUNTER — Inpatient Hospital Stay (HOSPITAL_BASED_OUTPATIENT_CLINIC_OR_DEPARTMENT_OTHER): Payer: Medicare Other | Admitting: Internal Medicine

## 2018-10-15 ENCOUNTER — Encounter: Payer: Self-pay | Admitting: Internal Medicine

## 2018-10-15 DIAGNOSIS — D709 Neutropenia, unspecified: Secondary | ICD-10-CM | POA: Diagnosis not present

## 2018-10-15 DIAGNOSIS — D708 Other neutropenia: Secondary | ICD-10-CM

## 2018-10-15 DIAGNOSIS — D72819 Decreased white blood cell count, unspecified: Secondary | ICD-10-CM

## 2018-10-15 DIAGNOSIS — M316 Other giant cell arteritis: Secondary | ICD-10-CM | POA: Diagnosis not present

## 2018-10-15 LAB — CBC WITH DIFFERENTIAL/PLATELET
Abs Immature Granulocytes: 0.01 10*3/uL (ref 0.00–0.07)
Basophils Absolute: 0 10*3/uL (ref 0.0–0.1)
Basophils Relative: 0 %
Eosinophils Absolute: 0 10*3/uL (ref 0.0–0.5)
Eosinophils Relative: 1 %
HCT: 42.9 % (ref 36.0–46.0)
Hemoglobin: 13.3 g/dL (ref 12.0–15.0)
Immature Granulocytes: 0 %
Lymphocytes Relative: 43 %
Lymphs Abs: 1 10*3/uL (ref 0.7–4.0)
MCH: 28.5 pg (ref 26.0–34.0)
MCHC: 31 g/dL (ref 30.0–36.0)
MCV: 92.1 fL (ref 80.0–100.0)
Monocytes Absolute: 0.2 10*3/uL (ref 0.1–1.0)
Monocytes Relative: 11 %
Neutro Abs: 1 10*3/uL — ABNORMAL LOW (ref 1.7–7.7)
Neutrophils Relative %: 45 %
Platelets: 226 10*3/uL (ref 150–400)
RBC: 4.66 MIL/uL (ref 3.87–5.11)
RDW: 14.6 % (ref 11.5–15.5)
WBC: 2.3 10*3/uL — ABNORMAL LOW (ref 4.0–10.5)
nRBC: 0 % (ref 0.0–0.2)

## 2018-10-15 MED ORDER — TBO-FILGRASTIM 480 MCG/0.8ML ~~LOC~~ SOSY
300.0000 ug | PREFILLED_SYRINGE | Freq: Once | SUBCUTANEOUS | Status: AC
  Administered 2018-10-15: 300 ug via SUBCUTANEOUS

## 2018-10-15 NOTE — Progress Notes (Signed)
I connected with [name] on 10/15/18 at  1:00 PM EDT by telephone visit and verified that I am speaking with the correct person using two identifiers.  I discussed the limitations, risks, security and privacy concerns of performing an evaluation and management service by telemedicine and the availability of in-person appointments. I also discussed with the patient that there may be a patient responsible charge related to this service. The patient expressed understanding and agreed to proceed.    Other persons participating in the visit and their role in the encounter: none  Patient's location: home  Provider's location: home    No history exists.     Chief Complaint: neutropenia on granix.     History of present illness:[name] 84 y.o.  female with history of likely autoimmune [prior history of infections]-currently on Granix.  Patient underwent parastomal hernia surgery in spring of this year.  Recovery was uneventful.  Patient has not had any infections through the last few months.  Her appetite is good.  No fevers or chills.  Observation/objective: Absolute neutrophil count 1.0.  Assessment and plan: Autoimmune neutropenia (HCC) # Autoimmune neutropenia- once a week- if ANC < 1.5- granix; patient's [place] on February 18 1.2. continue Granix weekly.   # Hx of temporal arteritis- [ x 16 years on low dose ]  # Covid precautions again reviewed with pt. Discussed with pt/ re: vaccinations [pt had previous "flu" from flu vaccine]  # DISPOSITION: # weekly cbc/granix x 12 times [Tuesdays] # follow up with MD in 12 week cbc-bmp/granix-Dr.B    Follow-up instructions:  I discussed the assessment and treatment plan with the patient.  The patient was provided an opportunity to ask questions and all were answered.  The patient agreed with the plan and demonstrated understanding of instructions.  The patient was advised to call back or seek an in person evaluation if  the symptoms worsen or if the condition fails to improve as anticipated.  I provided 12 minutes of non face-to-face telephone visit time during this encounter, and > 50% was spent counseling as documented under my assessment & plan.   Dr. Charlaine Dalton [place] at [facility] 10/15/2018 1:25 PM

## 2018-10-15 NOTE — Assessment & Plan Note (Addendum)
#   Autoimmune neutropenia- once a week- if ANC < 1.5- granix; patient's [place] on February 18 1.2. continue Granix weekly.   # Hx of temporal arteritis- [ x 16 years on low dose ]  # Covid precautions again reviewed with pt. Discussed with pt/ re: vaccinations [pt had previous "flu" from flu vaccine]  # DISPOSITION: # weekly cbc/granix x 12 times [Tuesdays] # follow up with MD in 12 week cbc-bmp/granix-Dr.B

## 2018-10-22 ENCOUNTER — Other Ambulatory Visit: Payer: Self-pay

## 2018-10-22 ENCOUNTER — Inpatient Hospital Stay: Payer: Medicare Other

## 2018-10-22 DIAGNOSIS — D708 Other neutropenia: Secondary | ICD-10-CM

## 2018-10-22 DIAGNOSIS — D709 Neutropenia, unspecified: Secondary | ICD-10-CM | POA: Diagnosis not present

## 2018-10-22 LAB — CBC WITH DIFFERENTIAL/PLATELET
Abs Immature Granulocytes: 0.19 10*3/uL — ABNORMAL HIGH (ref 0.00–0.07)
Basophils Absolute: 0 10*3/uL (ref 0.0–0.1)
Basophils Relative: 0 %
Eosinophils Absolute: 0 10*3/uL (ref 0.0–0.5)
Eosinophils Relative: 1 %
HCT: 42.3 % (ref 36.0–46.0)
Hemoglobin: 13.3 g/dL (ref 12.0–15.0)
Immature Granulocytes: 7 %
Lymphocytes Relative: 28 %
Lymphs Abs: 0.8 10*3/uL (ref 0.7–4.0)
MCH: 28.9 pg (ref 26.0–34.0)
MCHC: 31.4 g/dL (ref 30.0–36.0)
MCV: 91.8 fL (ref 80.0–100.0)
Monocytes Absolute: 0.2 10*3/uL (ref 0.1–1.0)
Monocytes Relative: 7 %
Neutro Abs: 1.6 10*3/uL — ABNORMAL LOW (ref 1.7–7.7)
Neutrophils Relative %: 57 %
Platelets: 228 10*3/uL (ref 150–400)
RBC: 4.61 MIL/uL (ref 3.87–5.11)
RDW: 14.7 % (ref 11.5–15.5)
WBC: 2.8 10*3/uL — ABNORMAL LOW (ref 4.0–10.5)
nRBC: 0 % (ref 0.0–0.2)

## 2018-10-28 ENCOUNTER — Other Ambulatory Visit: Payer: Self-pay

## 2018-10-29 ENCOUNTER — Inpatient Hospital Stay: Payer: Medicare Other | Attending: Internal Medicine

## 2018-10-29 ENCOUNTER — Inpatient Hospital Stay: Payer: Medicare Other

## 2018-10-29 ENCOUNTER — Other Ambulatory Visit: Payer: Self-pay

## 2018-10-29 DIAGNOSIS — D709 Neutropenia, unspecified: Secondary | ICD-10-CM | POA: Insufficient documentation

## 2018-10-29 DIAGNOSIS — M316 Other giant cell arteritis: Secondary | ICD-10-CM | POA: Diagnosis present

## 2018-10-29 DIAGNOSIS — D72819 Decreased white blood cell count, unspecified: Secondary | ICD-10-CM

## 2018-10-29 DIAGNOSIS — D708 Other neutropenia: Secondary | ICD-10-CM

## 2018-10-29 LAB — CBC WITH DIFFERENTIAL/PLATELET
Abs Immature Granulocytes: 0.11 10*3/uL — ABNORMAL HIGH (ref 0.00–0.07)
Basophils Absolute: 0 10*3/uL (ref 0.0–0.1)
Basophils Relative: 0 %
Eosinophils Absolute: 0 10*3/uL (ref 0.0–0.5)
Eosinophils Relative: 1 %
HCT: 41 % (ref 36.0–46.0)
Hemoglobin: 12.9 g/dL (ref 12.0–15.0)
Immature Granulocytes: 4 %
Lymphocytes Relative: 36 %
Lymphs Abs: 1.1 10*3/uL (ref 0.7–4.0)
MCH: 28.6 pg (ref 26.0–34.0)
MCHC: 31.5 g/dL (ref 30.0–36.0)
MCV: 90.9 fL (ref 80.0–100.0)
Monocytes Absolute: 0.5 10*3/uL (ref 0.1–1.0)
Monocytes Relative: 16 %
Neutro Abs: 1.4 10*3/uL — ABNORMAL LOW (ref 1.7–7.7)
Neutrophils Relative %: 43 %
Platelets: 227 10*3/uL (ref 150–400)
RBC: 4.51 MIL/uL (ref 3.87–5.11)
RDW: 14.7 % (ref 11.5–15.5)
WBC: 3.1 10*3/uL — ABNORMAL LOW (ref 4.0–10.5)
nRBC: 0 % (ref 0.0–0.2)

## 2018-10-29 MED ORDER — TBO-FILGRASTIM 480 MCG/0.8ML ~~LOC~~ SOSY
300.0000 ug | PREFILLED_SYRINGE | Freq: Once | SUBCUTANEOUS | Status: AC
  Administered 2018-10-29: 300 ug via SUBCUTANEOUS

## 2018-11-05 ENCOUNTER — Other Ambulatory Visit: Payer: Self-pay

## 2018-11-05 ENCOUNTER — Inpatient Hospital Stay: Payer: Medicare Other

## 2018-11-05 DIAGNOSIS — D708 Other neutropenia: Secondary | ICD-10-CM

## 2018-11-05 DIAGNOSIS — D72819 Decreased white blood cell count, unspecified: Secondary | ICD-10-CM

## 2018-11-05 DIAGNOSIS — D709 Neutropenia, unspecified: Secondary | ICD-10-CM | POA: Diagnosis not present

## 2018-11-05 LAB — CBC WITH DIFFERENTIAL/PLATELET
Abs Immature Granulocytes: 0 10*3/uL (ref 0.00–0.07)
Basophils Absolute: 0 10*3/uL (ref 0.0–0.1)
Basophils Relative: 0 %
Eosinophils Absolute: 0 10*3/uL (ref 0.0–0.5)
Eosinophils Relative: 0 %
HCT: 43.3 % (ref 36.0–46.0)
Hemoglobin: 13.5 g/dL (ref 12.0–15.0)
Immature Granulocytes: 0 %
Lymphocytes Relative: 31 %
Lymphs Abs: 0.7 10*3/uL (ref 0.7–4.0)
MCH: 28.4 pg (ref 26.0–34.0)
MCHC: 31.2 g/dL (ref 30.0–36.0)
MCV: 91 fL (ref 80.0–100.0)
Monocytes Absolute: 0.2 10*3/uL (ref 0.1–1.0)
Monocytes Relative: 9 %
Neutro Abs: 1.3 10*3/uL — ABNORMAL LOW (ref 1.7–7.7)
Neutrophils Relative %: 60 %
Platelets: 228 10*3/uL (ref 150–400)
RBC: 4.76 MIL/uL (ref 3.87–5.11)
RDW: 14.6 % (ref 11.5–15.5)
Smear Review: NORMAL
WBC: 2.2 10*3/uL — ABNORMAL LOW (ref 4.0–10.5)
nRBC: 0 % (ref 0.0–0.2)

## 2018-11-05 MED ORDER — TBO-FILGRASTIM 480 MCG/0.8ML ~~LOC~~ SOSY
300.0000 ug | PREFILLED_SYRINGE | Freq: Once | SUBCUTANEOUS | Status: AC
  Administered 2018-11-05: 300 ug via SUBCUTANEOUS

## 2018-11-11 ENCOUNTER — Telehealth: Payer: Self-pay | Admitting: Internal Medicine

## 2018-11-11 ENCOUNTER — Other Ambulatory Visit: Payer: Self-pay

## 2018-11-11 NOTE — Telephone Encounter (Signed)
Spoke to pt and completed travel screen. Also explained about addl screening questions they will be asked, new guidelines about mask req, no visitors, and fever checks °

## 2018-11-12 ENCOUNTER — Inpatient Hospital Stay: Payer: Medicare Other

## 2018-11-12 ENCOUNTER — Other Ambulatory Visit: Payer: Self-pay

## 2018-11-12 DIAGNOSIS — D709 Neutropenia, unspecified: Secondary | ICD-10-CM | POA: Diagnosis not present

## 2018-11-12 DIAGNOSIS — D708 Other neutropenia: Secondary | ICD-10-CM

## 2018-11-12 DIAGNOSIS — D72819 Decreased white blood cell count, unspecified: Secondary | ICD-10-CM

## 2018-11-12 LAB — CBC WITH DIFFERENTIAL/PLATELET
Abs Immature Granulocytes: 0.02 10*3/uL (ref 0.00–0.07)
Basophils Absolute: 0 10*3/uL (ref 0.0–0.1)
Basophils Relative: 0 %
Eosinophils Absolute: 0 10*3/uL (ref 0.0–0.5)
Eosinophils Relative: 1 %
HCT: 41.5 % (ref 36.0–46.0)
Hemoglobin: 13.1 g/dL (ref 12.0–15.0)
Immature Granulocytes: 1 %
Lymphocytes Relative: 36 %
Lymphs Abs: 0.8 10*3/uL (ref 0.7–4.0)
MCH: 28.9 pg (ref 26.0–34.0)
MCHC: 31.6 g/dL (ref 30.0–36.0)
MCV: 91.4 fL (ref 80.0–100.0)
Monocytes Absolute: 0.3 10*3/uL (ref 0.1–1.0)
Monocytes Relative: 12 %
Neutro Abs: 1.1 10*3/uL — ABNORMAL LOW (ref 1.7–7.7)
Neutrophils Relative %: 50 %
Platelets: 221 10*3/uL (ref 150–400)
RBC: 4.54 MIL/uL (ref 3.87–5.11)
RDW: 15 % (ref 11.5–15.5)
WBC: 2.3 10*3/uL — ABNORMAL LOW (ref 4.0–10.5)
nRBC: 0 % (ref 0.0–0.2)

## 2018-11-12 MED ORDER — TBO-FILGRASTIM 480 MCG/0.8ML ~~LOC~~ SOSY
300.0000 ug | PREFILLED_SYRINGE | Freq: Once | SUBCUTANEOUS | Status: AC
  Administered 2018-11-12: 300 ug via SUBCUTANEOUS

## 2018-11-18 ENCOUNTER — Other Ambulatory Visit: Payer: Self-pay | Admitting: *Deleted

## 2018-11-18 DIAGNOSIS — D709 Neutropenia, unspecified: Secondary | ICD-10-CM

## 2018-11-19 ENCOUNTER — Telehealth: Payer: Self-pay | Admitting: *Deleted

## 2018-11-19 ENCOUNTER — Inpatient Hospital Stay: Payer: Medicare Other

## 2018-11-19 ENCOUNTER — Other Ambulatory Visit: Payer: Self-pay

## 2018-11-19 DIAGNOSIS — D72819 Decreased white blood cell count, unspecified: Secondary | ICD-10-CM

## 2018-11-19 DIAGNOSIS — D709 Neutropenia, unspecified: Secondary | ICD-10-CM | POA: Diagnosis not present

## 2018-11-19 LAB — CBC WITH DIFFERENTIAL/PLATELET
Abs Immature Granulocytes: 0.02 10*3/uL (ref 0.00–0.07)
Basophils Absolute: 0 10*3/uL (ref 0.0–0.1)
Basophils Relative: 1 %
Eosinophils Absolute: 0 10*3/uL (ref 0.0–0.5)
Eosinophils Relative: 1 %
HCT: 44.1 % (ref 36.0–46.0)
Hemoglobin: 13.1 g/dL (ref 12.0–15.0)
Immature Granulocytes: 1 %
Lymphocytes Relative: 39 %
Lymphs Abs: 0.8 10*3/uL (ref 0.7–4.0)
MCH: 27.9 pg (ref 26.0–34.0)
MCHC: 29.7 g/dL — ABNORMAL LOW (ref 30.0–36.0)
MCV: 94 fL (ref 80.0–100.0)
Monocytes Absolute: 0.2 10*3/uL (ref 0.1–1.0)
Monocytes Relative: 11 %
Neutro Abs: 0.9 10*3/uL — ABNORMAL LOW (ref 1.7–7.7)
Neutrophils Relative %: 47 %
Platelets: 209 10*3/uL (ref 150–400)
RBC: 4.69 MIL/uL (ref 3.87–5.11)
RDW: 14.8 % (ref 11.5–15.5)
WBC: 2 10*3/uL — ABNORMAL LOW (ref 4.0–10.5)
nRBC: 0 % (ref 0.0–0.2)

## 2018-11-19 MED ORDER — TBO-FILGRASTIM 300 MCG/0.5ML ~~LOC~~ SOSY
300.0000 ug | PREFILLED_SYRINGE | Freq: Once | SUBCUTANEOUS | Status: AC
  Administered 2018-11-19: 300 ug via SUBCUTANEOUS
  Filled 2018-11-19: qty 0.5

## 2018-11-19 MED ORDER — TBO-FILGRASTIM 480 MCG/0.8ML ~~LOC~~ SOSY: 300.0000 ug | PREFILLED_SYRINGE | Freq: Once | SUBCUTANEOUS | Status: DC

## 2018-11-19 MED ORDER — TBO-FILGRASTIM 480 MCG/0.8ML ~~LOC~~ SOSY: 300.0000 ug | PREFILLED_SYRINGE | Freq: Once | SUBCUTANEOUS | Status: AC

## 2018-11-19 NOTE — Telephone Encounter (Addendum)
Patient called concerned that her [place] has dropped to 0.9. She states the only medicine change has been that Dr Jefm Bryant has decreased her Prednisone to 2.5 mg last week. She also reports felling "weak and wobbly." Please advise or return her call [phone]

## 2018-11-20 NOTE — Telephone Encounter (Signed)
Dr. B - please advise. 

## 2018-11-20 NOTE — Telephone Encounter (Signed)
Spoke to pt- re: will increase dose of granix to 480 mcg/day given ANC 0.9.   For now continue prednisone 2.5 mg/day; if not improving will need to speak to Dr.K[facility]odle to increase back to 2.5/5mg  qOD. If still worsening would need to repeat BMBx. Pt agrees with the plan. GB

## 2018-11-25 ENCOUNTER — Other Ambulatory Visit: Payer: Self-pay

## 2018-11-26 ENCOUNTER — Inpatient Hospital Stay: Payer: Medicare Other

## 2018-11-26 ENCOUNTER — Other Ambulatory Visit: Payer: Self-pay

## 2018-11-26 DIAGNOSIS — D709 Neutropenia, unspecified: Secondary | ICD-10-CM | POA: Diagnosis not present

## 2018-11-26 DIAGNOSIS — D72819 Decreased white blood cell count, unspecified: Secondary | ICD-10-CM

## 2018-11-26 DIAGNOSIS — D708 Other neutropenia: Secondary | ICD-10-CM

## 2018-11-26 LAB — CBC WITH DIFFERENTIAL/PLATELET
Abs Immature Granulocytes: 0.1 10*3/uL — ABNORMAL HIGH (ref 0.00–0.07)
Basophils Absolute: 0 10*3/uL (ref 0.0–0.1)
Basophils Relative: 0 %
Eosinophils Absolute: 0.1 10*3/uL (ref 0.0–0.5)
Eosinophils Relative: 2 %
HCT: 43.4 % (ref 36.0–46.0)
Hemoglobin: 13.3 g/dL (ref 12.0–15.0)
Immature Granulocytes: 4 %
Lymphocytes Relative: 40 %
Lymphs Abs: 1.1 10*3/uL (ref 0.7–4.0)
MCH: 28.2 pg (ref 26.0–34.0)
MCHC: 30.6 g/dL (ref 30.0–36.0)
MCV: 92.1 fL (ref 80.0–100.0)
Monocytes Absolute: 0.4 10*3/uL (ref 0.1–1.0)
Monocytes Relative: 13 %
Neutro Abs: 1.1 10*3/uL — ABNORMAL LOW (ref 1.7–7.7)
Neutrophils Relative %: 41 %
Platelets: 220 10*3/uL (ref 150–400)
RBC: 4.71 MIL/uL (ref 3.87–5.11)
RDW: 14.9 % (ref 11.5–15.5)
WBC: 2.8 10*3/uL — ABNORMAL LOW (ref 4.0–10.5)
nRBC: 0 % (ref 0.0–0.2)

## 2018-11-26 MED ORDER — TBO-FILGRASTIM 480 MCG/0.8ML ~~LOC~~ SOSY
480.0000 ug | PREFILLED_SYRINGE | Freq: Once | SUBCUTANEOUS | Status: AC
  Administered 2018-11-26: 480 ug via SUBCUTANEOUS

## 2018-12-03 ENCOUNTER — Inpatient Hospital Stay: Payer: Medicare Other

## 2018-12-03 ENCOUNTER — Other Ambulatory Visit: Payer: Self-pay

## 2018-12-03 ENCOUNTER — Inpatient Hospital Stay: Payer: Medicare Other | Attending: Internal Medicine

## 2018-12-03 DIAGNOSIS — Z853 Personal history of malignant neoplasm of breast: Secondary | ICD-10-CM | POA: Insufficient documentation

## 2018-12-03 DIAGNOSIS — D708 Other neutropenia: Secondary | ICD-10-CM | POA: Insufficient documentation

## 2018-12-03 DIAGNOSIS — Z87891 Personal history of nicotine dependence: Secondary | ICD-10-CM | POA: Diagnosis not present

## 2018-12-03 DIAGNOSIS — D72819 Decreased white blood cell count, unspecified: Secondary | ICD-10-CM

## 2018-12-03 DIAGNOSIS — M316 Other giant cell arteritis: Secondary | ICD-10-CM | POA: Diagnosis present

## 2018-12-03 DIAGNOSIS — D709 Neutropenia, unspecified: Secondary | ICD-10-CM

## 2018-12-03 DIAGNOSIS — Z7982 Long term (current) use of aspirin: Secondary | ICD-10-CM | POA: Insufficient documentation

## 2018-12-03 DIAGNOSIS — Z79899 Other long term (current) drug therapy: Secondary | ICD-10-CM | POA: Diagnosis not present

## 2018-12-03 DIAGNOSIS — F419 Anxiety disorder, unspecified: Secondary | ICD-10-CM | POA: Diagnosis not present

## 2018-12-03 LAB — CBC WITH DIFFERENTIAL/PLATELET
Abs Immature Granulocytes: 0.01 10*3/uL (ref 0.00–0.07)
Basophils Absolute: 0 10*3/uL (ref 0.0–0.1)
Basophils Relative: 0 %
Eosinophils Absolute: 0 10*3/uL (ref 0.0–0.5)
Eosinophils Relative: 1 %
HCT: 44.4 % (ref 36.0–46.0)
Hemoglobin: 13.9 g/dL (ref 12.0–15.0)
Immature Granulocytes: 0 %
Lymphocytes Relative: 38 %
Lymphs Abs: 0.9 10*3/uL (ref 0.7–4.0)
MCH: 28.6 pg (ref 26.0–34.0)
MCHC: 31.3 g/dL (ref 30.0–36.0)
MCV: 91.4 fL (ref 80.0–100.0)
Monocytes Absolute: 0.3 10*3/uL (ref 0.1–1.0)
Monocytes Relative: 12 %
Neutro Abs: 1.1 10*3/uL — ABNORMAL LOW (ref 1.7–7.7)
Neutrophils Relative %: 49 %
Platelets: 225 10*3/uL (ref 150–400)
RBC: 4.86 MIL/uL (ref 3.87–5.11)
RDW: 15.1 % (ref 11.5–15.5)
Smear Review: NORMAL
WBC: 2.4 10*3/uL — ABNORMAL LOW (ref 4.0–10.5)
nRBC: 0 % (ref 0.0–0.2)

## 2018-12-03 MED ORDER — TBO-FILGRASTIM 480 MCG/0.8ML ~~LOC~~ SOSY
480.0000 ug | PREFILLED_SYRINGE | Freq: Once | SUBCUTANEOUS | Status: AC
  Administered 2018-12-03: 480 ug via SUBCUTANEOUS

## 2018-12-09 ENCOUNTER — Other Ambulatory Visit: Payer: Self-pay

## 2018-12-10 ENCOUNTER — Inpatient Hospital Stay: Payer: Medicare Other

## 2018-12-10 ENCOUNTER — Other Ambulatory Visit: Payer: Self-pay

## 2018-12-10 DIAGNOSIS — D708 Other neutropenia: Secondary | ICD-10-CM

## 2018-12-10 DIAGNOSIS — D709 Neutropenia, unspecified: Secondary | ICD-10-CM

## 2018-12-10 DIAGNOSIS — D72819 Decreased white blood cell count, unspecified: Secondary | ICD-10-CM

## 2018-12-10 LAB — CBC WITH DIFFERENTIAL/PLATELET
Abs Immature Granulocytes: 0 10*3/uL (ref 0.00–0.07)
Basophils Absolute: 0 10*3/uL (ref 0.0–0.1)
Basophils Relative: 1 %
Eosinophils Absolute: 0 10*3/uL (ref 0.0–0.5)
Eosinophils Relative: 1 %
HCT: 43.8 % (ref 36.0–46.0)
Hemoglobin: 13.8 g/dL (ref 12.0–15.0)
Immature Granulocytes: 0 %
Lymphocytes Relative: 32 %
Lymphs Abs: 0.7 10*3/uL (ref 0.7–4.0)
MCH: 28.5 pg (ref 26.0–34.0)
MCHC: 31.5 g/dL (ref 30.0–36.0)
MCV: 90.5 fL (ref 80.0–100.0)
Monocytes Absolute: 0.3 10*3/uL (ref 0.1–1.0)
Monocytes Relative: 12 %
Neutro Abs: 1.2 10*3/uL — ABNORMAL LOW (ref 1.7–7.7)
Neutrophils Relative %: 54 %
Platelets: 219 10*3/uL (ref 150–400)
RBC Morphology: NORMAL
RBC: 4.84 MIL/uL (ref 3.87–5.11)
RDW: 14.9 % (ref 11.5–15.5)
Smear Review: ADEQUATE
WBC: 2.2 10*3/uL — ABNORMAL LOW (ref 4.0–10.5)
nRBC: 0 % (ref 0.0–0.2)

## 2018-12-10 MED ORDER — TBO-FILGRASTIM 480 MCG/0.8ML ~~LOC~~ SOSY
480.0000 ug | PREFILLED_SYRINGE | Freq: Once | SUBCUTANEOUS | Status: AC
  Administered 2018-12-10: 480 ug via SUBCUTANEOUS

## 2018-12-16 ENCOUNTER — Telehealth: Payer: Self-pay | Admitting: *Deleted

## 2018-12-16 NOTE — Telephone Encounter (Signed)
Patient has never met Dr B and she has some questions for him regarding increasing her dose and wanting to go out of town to her granddaughters wedding also and she would like to see doctor in person, not televisit. She has an appointment tomorrow for lab/ injection, could she be added to see physician too?

## 2018-12-16 NOTE — Telephone Encounter (Signed)
Per Dr. Kavin Leech to see patient and add pt to the schedule, but she will need to come in earlier tomorrow- 845 for labs and 9am for Dr. B. We do not have any available slots at her presently scheduled apts times. Colette, please contact pt to arrange for this new apt time. Thanks.

## 2018-12-17 ENCOUNTER — Encounter: Payer: Self-pay | Admitting: Internal Medicine

## 2018-12-17 ENCOUNTER — Inpatient Hospital Stay: Payer: Medicare Other

## 2018-12-17 ENCOUNTER — Inpatient Hospital Stay (HOSPITAL_BASED_OUTPATIENT_CLINIC_OR_DEPARTMENT_OTHER): Payer: Medicare Other | Admitting: Internal Medicine

## 2018-12-17 ENCOUNTER — Other Ambulatory Visit: Payer: Self-pay

## 2018-12-17 DIAGNOSIS — Z87891 Personal history of nicotine dependence: Secondary | ICD-10-CM

## 2018-12-17 DIAGNOSIS — Z7982 Long term (current) use of aspirin: Secondary | ICD-10-CM

## 2018-12-17 DIAGNOSIS — Z853 Personal history of malignant neoplasm of breast: Secondary | ICD-10-CM

## 2018-12-17 DIAGNOSIS — F419 Anxiety disorder, unspecified: Secondary | ICD-10-CM

## 2018-12-17 DIAGNOSIS — D708 Other neutropenia: Secondary | ICD-10-CM | POA: Diagnosis not present

## 2018-12-17 DIAGNOSIS — M316 Other giant cell arteritis: Secondary | ICD-10-CM

## 2018-12-17 DIAGNOSIS — D709 Neutropenia, unspecified: Secondary | ICD-10-CM

## 2018-12-17 DIAGNOSIS — D72819 Decreased white blood cell count, unspecified: Secondary | ICD-10-CM

## 2018-12-17 DIAGNOSIS — Z79899 Other long term (current) drug therapy: Secondary | ICD-10-CM

## 2018-12-17 LAB — CBC WITH DIFFERENTIAL/PLATELET
Abs Immature Granulocytes: 0.04 10*3/uL (ref 0.00–0.07)
Basophils Absolute: 0 10*3/uL (ref 0.0–0.1)
Basophils Relative: 1 %
Eosinophils Absolute: 0 10*3/uL (ref 0.0–0.5)
Eosinophils Relative: 1 %
HCT: 45.7 % (ref 36.0–46.0)
Hemoglobin: 14.1 g/dL (ref 12.0–15.0)
Immature Granulocytes: 2 %
Lymphocytes Relative: 52 %
Lymphs Abs: 1.1 10*3/uL (ref 0.7–4.0)
MCH: 28.1 pg (ref 26.0–34.0)
MCHC: 30.9 g/dL (ref 30.0–36.0)
MCV: 91 fL (ref 80.0–100.0)
Monocytes Absolute: 0.4 10*3/uL (ref 0.1–1.0)
Monocytes Relative: 16 %
Neutro Abs: 0.6 10*3/uL — ABNORMAL LOW (ref 1.7–7.7)
Neutrophils Relative %: 28 %
Platelets: 220 10*3/uL (ref 150–400)
RBC: 5.02 MIL/uL (ref 3.87–5.11)
RDW: 15.1 % (ref 11.5–15.5)
WBC: 2.2 10*3/uL — ABNORMAL LOW (ref 4.0–10.5)
nRBC: 0 % (ref 0.0–0.2)

## 2018-12-17 MED ORDER — TBO-FILGRASTIM 480 MCG/0.8ML ~~LOC~~ SOSY
480.0000 ug | PREFILLED_SYRINGE | Freq: Once | SUBCUTANEOUS | Status: AC
  Administered 2018-12-17: 10:00:00 480 ug via SUBCUTANEOUS
  Filled 2018-12-17: qty 0.8

## 2018-12-17 MED ORDER — LEVOFLOXACIN 500 MG PO TABS: 500.0000 mg | ORAL_TABLET | Freq: Every day | ORAL | 0 refills | Status: DC

## 2018-12-17 NOTE — Patient Instructions (Signed)
Please remember to bring your advanced directives with you at the next appointment.

## 2018-12-17 NOTE — Progress Notes (Signed)
[facility] NOTE  Patient Care Team: Baxter Hire, MD as PCP - General (Internal Medicine) Emmaline Kluver., MD (Rheumatology) Dasher, Rayvon Char, MD (Dermatology)  CHIEF COMPLAINTS/PURPOSE OF CONSULTATION: Autoimmune neutropenia  #Autoimmune neutropenia-weekly Granix/CBC [Dr.Crocoran]; BMBx- no malignancy [Bone marrow aspirate and biopsy on 09/04/2017 revealed a hypercellular marrow for age with trilineage hematopoiesis.  There was abundant mature neutrophils.  Significant dyspoiesis or increased blasts was not identified.  Flow cytometry revealed a predominance of T lymphocytes (16% of all cells) with no abnormal phenotype.  There was a minor B-cell population (13% of lymphocytes) with slight kappa light chain excess.  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   #   temporal arteritis [2003] chronic prednisone/ 2.5 mg a day. Oncology History   No history exists.    HISTORY OF PRESENTING ILLNESS:  [name] 84 y.o.  female above history of autoimmune neutropenia is here for follow-up.  Patient is currently on Granix once a day.  Approximately being of July dose was increased to 480 mcg weekly-as patient's neutropenia seems to be worsening.  Incidentally patient worsening neutropenia coincided with decreasing dose of prednisone.  She is currently on prednisone 2.5 mg a day; previously 5/2.5.   Patient has not had any fever chills; denies any infections.   She is quite anxious about upcoming wedding in [place].  Review of Systems  Constitutional: Negative for chills, diaphoresis, fever, malaise/fatigue and weight loss.  HENT: Negative for nosebleeds and sore throat.   Eyes: Negative for double vision.  Respiratory: Negative for cough, hemoptysis, sputum production, shortness of breath and wheezing.   Cardiovascular: Negative for chest pain, palpitations, orthopnea and leg swelling.   Gastrointestinal: Positive for abdominal pain. Negative for blood in stool, constipation, diarrhea, heartburn, melena, nausea and vomiting.       Around parastomal hernia.  Genitourinary: Negative for dysuria, frequency and urgency.  Musculoskeletal: Negative for back pain and joint pain.  Skin: Negative.  Negative for itching and rash.  Neurological: Negative for dizziness, tingling, focal weakness, weakness and headaches.  Endo/Heme/Allergies: Does not bruise/bleed easily.  Psychiatric/Behavioral: Negative for depression. The patient is nervous/anxious. The patient does not have insomnia.      MEDICAL HISTORY:  Past Medical History:  Diagnosis Date  . Breast cancer ([place]) 1985   bilateral mastectomies  . Depression    1 time specific situation that she did not know how to deal wiht  . Diverticulosis   . Hiatal hernia   . History of Bell's palsy   . History of colon polyps   . History of nephrolithiasis   . History of pancreatitis   . Hyperlipidemia   . IBS (irritable bowel syndrome)   . Osteoporosis    osteopenia in neck  . Skin cancer   . Temporal arteritis ([place])     SURGICAL HISTORY: Past Surgical History:  Procedure Laterality Date  . AUGMENTATION MAMMAPLASTY Bilateral 2000  . COLON RESECTION SIGMOID N/A 10/13/2017   Procedure: COLON RESECTION SIGMOID;  Surgeon: Herbert Pun, MD;  Location: [facility] ORS;  Service: General;  Laterality: N/A;  . COLOSTOMY N/A 10/13/2017   Procedure: COLOSTOMY;  Surgeon: Herbert Pun, MD;  Location: [facility] ORS;  Service: General;  Laterality: N/A;  . LAPAROSCOPIC CHOLECYSTECTOMY  2009  . MASTECTOMY  1983   bilateral    SOCIAL HISTORY: Social History   Socioeconomic History  . Marital status: Widowed    Spouse name: Not on file  . Number  of children: 2  . Years of education: college  . Highest education level: Not on file  Occupational History  . 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  Financial resource strain: Not on file  . Food insecurity    Worry: Not on file    Inability: Not on file  . Transportation needs    Medical: Not on file    Non-medical: Not on file  Tobacco Use  . Smoking status: Former Smoker    Packs/day: 0.25    Years: 30.00    Pack years: 7.50    Types: Cigarettes    Quit date: 05/17/2005    Years since quitting: 13.5  . Smokeless tobacco: Never Used  . Tobacco comment: quit 11 years ago  Substance and Sexual Activity  . Alcohol use: No    Alcohol/week: 0.0 standard drinks  . Drug use: No  . Sexual activity: Not Currently  Lifestyle  . Physical activity    Days per week: Not on file    Minutes per session: Not on file  . Stress: Not on file  Relationships  . Social Herbalist on phone: Not on file    Gets together: Not on file    Attends religious service: Not on file    Active member of club or organization: Not on file    Attends meetings of clubs or organizations: Not on file    Relationship status: Not on file  . Intimate partner violence    Fear of current or ex partner: Not on file    Emotionally abused: Not on file    Physically abused: Not on file    Forced sexual activity: Not on file  Other Topics Concern  . Not on file  Social History Narrative   Patient lives at home alone.    Patient is retired.    Patient has some college.    Patient has 2 children.    FAMILY HISTORY: Family History  Problem Relation Age of Onset  . Colon cancer Mother 32  . Scleroderma Father 30  . Alcoholism Brother   . Stomach cancer Neg Hx     ALLERGIES:  is allergic to morphine; benzalkonium chloride; escitalopram; hydralazine; neomycin-bacitracin zn-polymyx; and bacitracin-neomycin-polymyxin.  MEDICATIONS:  Current Outpatient Medications  Medication Sig Dispense Refill  . ALPRAZolam (XANAX) 0.25 MG tablet Take 0.25 mg by mouth 2 (two) times daily as needed.     Marland Kitchen aspirin EC 81 MG tablet Take 81 mg daily by mouth.    .  Biotin 1000 MCG tablet Take 1,000 mcg by mouth daily.     . Cholecalciferol (VITAMIN D3) 2000 units capsule Take 2,000 Units daily by mouth.     . Cyanocobalamin (B-12 PO) Take 2,500 mcg daily by mouth.     . predniSONE (DELTASONE) 5 MG tablet Take 2.5 mg by mouth daily.     . traMADol (ULTRAM) 50 MG tablet Take 50 mg by mouth daily.     Marland Kitchen levofloxacin (LEVAQUIN) 500 MG tablet Take 1 tablet (500 mg total) by mouth daily. 7 tablet 0   No current facility-administered medications for this visit.    Facility-Administered Medications Ordered in Other Visits  Medication Dose Route Frequency Provider Last Rate Last Dose  . Tbo-Filgrastim (GRANIX) injection 480 mcg  480 mcg Subcutaneous Once Cammie Sickle, MD          .  PHYSICAL EXAMINATION: ECOG PERFORMANCE STATUS: 0 - Asymptomatic  Vitals:  12/17/18 0915  BP: (!) 162/82  Pulse: 82  Resp: 20  Temp: 98.3 F (36.8 C)   There were no vitals filed for this visit.  Physical Exam  Constitutional: She is oriented to person, place, and time and well-developed, well-nourished, and in no distress.  Alone.  HENT:  Head: Normocephalic and atraumatic.  Mouth/Throat: Oropharynx is clear and moist. No oropharyngeal exudate.  Eyes: Pupils are equal, round, and reactive to light.  Neck: Normal range of motion. Neck supple.  Cardiovascular: Normal rate and regular rhythm.  Pulmonary/Chest: No respiratory distress. She has no wheezes.  Abdominal: Soft. Bowel sounds are normal. She exhibits no distension and no mass. There is no abdominal tenderness. There is no rebound and no guarding.  Colostomy.  Parastomal hernia.  Musculoskeletal: Normal range of motion.        General: No tenderness or edema.  Neurological: She is alert and oriented to person, place, and time.  Skin: Skin is warm.  Psychiatric: Affect normal.  Anxious.     LABORATORY DATA:  I have reviewed the data as listed Lab Results  Component Value Date   WBC 2.2 (L)  12/17/2018   HGB 14.1 12/17/2018   HCT 45.7 12/17/2018   MCV 91.0 12/17/2018   PLT 220 12/17/2018   Recent Labs    04/02/18 1104 05/14/18 0935 06/25/18 0943  NA 136 141 142  K 4.1 4.1 4.6  CL 102 103 104  CO2 27 27 27   GLUCOSE 114* 100* 110*  BUN 18 18 15   CREATININE 0.85 0.93 0.92  CALCIUM 9.0 9.5 9.4  GFRNONAA >60 58* 58*  GFRAA >60 >60 >60  PROT 7.2 7.5 7.9  ALBUMIN 4.0 4.2 4.3  AST 21 23 28   ALT 13 16 18   ALKPHOS 120 126 118  BILITOT 1.2 1.0 1.0    RADIOGRAPHIC STUDIES: I have personally reviewed the radiological images as listed and agreed with the findings in the report. No results found.  ASSESSMENT & PLAN:   Autoimmune neutropenia (HCC) # Autoimmune neutropenia- once a week- if ANC < 1.5- granix; patient's [place] today- 0.6.proceed weekly Granix.  Clinically asymptomatic.  #Patient extremely anxious about dropping ANC over the last few weeks/interestingly this is coincided with dropping her prednisone dose 5/2.5 alternate to 2.5 currently.  # Hx of temporal arteritis- [ x 16 years on low dose 2.5 mg/day prednisone]-followed by rheumatology.  Stable.  # Body aches-from Granix recommend claritin/tylenol prn.   #Extremely anxious-reassured the patient.  Also given prescription for Levaquin in case she needed for fevers-while admitting.  She will start only if she has any symptoms of infection/fever. # For the upcoming wedding we will increase the frequency of the Granix.   # 25 minutes face-to-face with the patient discussing the above plan of care; more than 50% of time spent on prognosis/ natural history; counseling and coordination.   # DISPOSITION: # granix today; in 1 week- granix/cbc. #  08/05- cbc; granix; 8/6 & 8/7- granix.  # follow up with MD on 8/11-cbc-granix-Dr.B  All questions were answered. The patient knows to call the clinic with any problems, questions or concerns.    Cammie Sickle, MD 12/17/2018 10:07 AM

## 2018-12-17 NOTE — Assessment & Plan Note (Addendum)
#   Autoimmune neutropenia- once a week- if ANC < 1.5- granix; patient's [place] today- 0.6.proceed weekly Granix.  Clinically asymptomatic.  #Patient extremely anxious about dropping ANC over the last few weeks/interestingly this is coincided with dropping her prednisone dose 5/2.5 alternate to 2.5 currently.  # Hx of temporal arteritis- [ x 16 years on low dose 2.5 mg/day prednisone]-followed by rheumatology.  Stable.  # Body aches-from Granix recommend claritin/tylenol prn.   #Extremely anxious-reassured the patient.  Also given prescription for Levaquin in case she needed for fevers-while admitting.  She will start only if she has any symptoms of infection/fever. # For the upcoming wedding we will increase the frequency of the Granix.   # 25 minutes face-to-face with the patient discussing the above plan of care; more than 50% of time spent on prognosis/ natural history; counseling and coordination.   # DISPOSITION: # granix today; in 1 week- granix/cbc. #  08/05- cbc; granix; 8/6 & 8/7- granix.  # follow up with MD on 8/11-cbc-granix-Dr.B

## 2018-12-24 ENCOUNTER — Other Ambulatory Visit: Payer: Self-pay

## 2018-12-24 ENCOUNTER — Inpatient Hospital Stay: Payer: Medicare Other

## 2018-12-24 DIAGNOSIS — D708 Other neutropenia: Secondary | ICD-10-CM

## 2018-12-24 DIAGNOSIS — D72819 Decreased white blood cell count, unspecified: Secondary | ICD-10-CM

## 2018-12-24 LAB — CBC WITH DIFFERENTIAL/PLATELET
Abs Immature Granulocytes: 0 10*3/uL (ref 0.00–0.07)
Basophils Absolute: 0 10*3/uL (ref 0.0–0.1)
Basophils Relative: 1 %
Eosinophils Absolute: 0 10*3/uL (ref 0.0–0.5)
Eosinophils Relative: 1 %
HCT: 43.3 % (ref 36.0–46.0)
Hemoglobin: 13.4 g/dL (ref 12.0–15.0)
Immature Granulocytes: 0 %
Lymphocytes Relative: 33 %
Lymphs Abs: 0.7 10*3/uL (ref 0.7–4.0)
MCH: 28.2 pg (ref 26.0–34.0)
MCHC: 30.9 g/dL (ref 30.0–36.0)
MCV: 91.2 fL (ref 80.0–100.0)
Monocytes Absolute: 0.3 10*3/uL (ref 0.1–1.0)
Monocytes Relative: 13 %
Neutro Abs: 1.1 10*3/uL — ABNORMAL LOW (ref 1.7–7.7)
Neutrophils Relative %: 52 %
Platelets: 214 10*3/uL (ref 150–400)
RBC: 4.75 MIL/uL (ref 3.87–5.11)
RDW: 15.1 % (ref 11.5–15.5)
Smear Review: NORMAL
WBC: 2.2 10*3/uL — ABNORMAL LOW (ref 4.0–10.5)
nRBC: 0 % (ref 0.0–0.2)

## 2018-12-24 MED ORDER — TBO-FILGRASTIM 480 MCG/0.8ML ~~LOC~~ SOSY
480.0000 ug | PREFILLED_SYRINGE | Freq: Once | SUBCUTANEOUS | Status: AC
  Administered 2018-12-24: 480 ug via SUBCUTANEOUS

## 2018-12-31 ENCOUNTER — Other Ambulatory Visit: Payer: Self-pay

## 2018-12-31 ENCOUNTER — Inpatient Hospital Stay: Payer: Medicare Other

## 2019-01-01 ENCOUNTER — Inpatient Hospital Stay: Payer: Medicare Other | Attending: Internal Medicine

## 2019-01-01 ENCOUNTER — Inpatient Hospital Stay: Payer: Medicare Other

## 2019-01-01 ENCOUNTER — Other Ambulatory Visit: Payer: Self-pay

## 2019-01-01 DIAGNOSIS — D708 Other neutropenia: Secondary | ICD-10-CM | POA: Insufficient documentation

## 2019-01-01 DIAGNOSIS — Z885 Allergy status to narcotic agent status: Secondary | ICD-10-CM | POA: Diagnosis not present

## 2019-01-01 DIAGNOSIS — M316 Other giant cell arteritis: Secondary | ICD-10-CM | POA: Insufficient documentation

## 2019-01-01 DIAGNOSIS — Z800 Family history of malignant neoplasm of digestive organs: Secondary | ICD-10-CM | POA: Insufficient documentation

## 2019-01-01 DIAGNOSIS — Z811 Family history of alcohol abuse and dependence: Secondary | ICD-10-CM | POA: Insufficient documentation

## 2019-01-01 DIAGNOSIS — R233 Spontaneous ecchymoses: Secondary | ICD-10-CM | POA: Diagnosis not present

## 2019-01-01 DIAGNOSIS — Z87891 Personal history of nicotine dependence: Secondary | ICD-10-CM | POA: Insufficient documentation

## 2019-01-01 DIAGNOSIS — Z85828 Personal history of other malignant neoplasm of skin: Secondary | ICD-10-CM | POA: Insufficient documentation

## 2019-01-01 DIAGNOSIS — Z79899 Other long term (current) drug therapy: Secondary | ICD-10-CM | POA: Diagnosis not present

## 2019-01-01 DIAGNOSIS — Z832 Family history of diseases of the blood and blood-forming organs and certain disorders involving the immune mechanism: Secondary | ICD-10-CM | POA: Diagnosis not present

## 2019-01-01 DIAGNOSIS — M8580 Other specified disorders of bone density and structure, unspecified site: Secondary | ICD-10-CM | POA: Insufficient documentation

## 2019-01-01 DIAGNOSIS — Z9013 Acquired absence of bilateral breasts and nipples: Secondary | ICD-10-CM | POA: Insufficient documentation

## 2019-01-01 DIAGNOSIS — R7989 Other specified abnormal findings of blood chemistry: Secondary | ICD-10-CM | POA: Insufficient documentation

## 2019-01-01 DIAGNOSIS — Z853 Personal history of malignant neoplasm of breast: Secondary | ICD-10-CM | POA: Diagnosis not present

## 2019-01-01 DIAGNOSIS — T380X5A Adverse effect of glucocorticoids and synthetic analogues, initial encounter: Secondary | ICD-10-CM | POA: Diagnosis not present

## 2019-01-01 DIAGNOSIS — Z7952 Long term (current) use of systemic steroids: Secondary | ICD-10-CM | POA: Diagnosis not present

## 2019-01-01 DIAGNOSIS — D72819 Decreased white blood cell count, unspecified: Secondary | ICD-10-CM

## 2019-01-01 DIAGNOSIS — Z8719 Personal history of other diseases of the digestive system: Secondary | ICD-10-CM | POA: Diagnosis not present

## 2019-01-01 DIAGNOSIS — Z87442 Personal history of urinary calculi: Secondary | ICD-10-CM | POA: Insufficient documentation

## 2019-01-01 LAB — CBC WITH DIFFERENTIAL/PLATELET
Abs Immature Granulocytes: 0 10*3/uL (ref 0.00–0.07)
Basophils Absolute: 0 10*3/uL (ref 0.0–0.1)
Basophils Relative: 1 %
Eosinophils Absolute: 0 10*3/uL (ref 0.0–0.5)
Eosinophils Relative: 1 %
HCT: 42.5 % (ref 36.0–46.0)
Hemoglobin: 13.4 g/dL (ref 12.0–15.0)
Immature Granulocytes: 0 %
Lymphocytes Relative: 34 %
Lymphs Abs: 0.7 10*3/uL (ref 0.7–4.0)
MCH: 28.8 pg (ref 26.0–34.0)
MCHC: 31.5 g/dL (ref 30.0–36.0)
MCV: 91.2 fL (ref 80.0–100.0)
Monocytes Absolute: 0.2 10*3/uL (ref 0.1–1.0)
Monocytes Relative: 12 %
Neutro Abs: 1 10*3/uL — ABNORMAL LOW (ref 1.7–7.7)
Neutrophils Relative %: 52 %
Platelets: 203 10*3/uL (ref 150–400)
RBC: 4.66 MIL/uL (ref 3.87–5.11)
RDW: 15.5 % (ref 11.5–15.5)
Smear Review: NORMAL
WBC Morphology: REACTIVE
WBC: 2.1 10*3/uL — ABNORMAL LOW (ref 4.0–10.5)
nRBC: 0 % (ref 0.0–0.2)

## 2019-01-01 MED ORDER — FILGRASTIM-SNDZ 480 MCG/0.8ML IJ SOSY
480.0000 ug | PREFILLED_SYRINGE | Freq: Once | INTRAMUSCULAR | Status: AC
  Administered 2019-01-01: 480 ug via SUBCUTANEOUS
  Filled 2019-01-01: qty 0.8

## 2019-01-02 ENCOUNTER — Ambulatory Visit: Payer: Medicare Other

## 2019-01-03 ENCOUNTER — Ambulatory Visit: Payer: Medicare Other

## 2019-01-06 ENCOUNTER — Other Ambulatory Visit: Payer: Self-pay

## 2019-01-07 ENCOUNTER — Inpatient Hospital Stay: Payer: Medicare Other

## 2019-01-07 ENCOUNTER — Other Ambulatory Visit: Payer: Self-pay

## 2019-01-07 ENCOUNTER — Encounter: Payer: Self-pay | Admitting: Internal Medicine

## 2019-01-07 ENCOUNTER — Inpatient Hospital Stay (HOSPITAL_BASED_OUTPATIENT_CLINIC_OR_DEPARTMENT_OTHER): Payer: Medicare Other | Admitting: Internal Medicine

## 2019-01-07 DIAGNOSIS — D708 Other neutropenia: Secondary | ICD-10-CM

## 2019-01-07 DIAGNOSIS — D709 Neutropenia, unspecified: Secondary | ICD-10-CM

## 2019-01-07 DIAGNOSIS — D72819 Decreased white blood cell count, unspecified: Secondary | ICD-10-CM

## 2019-01-07 LAB — CBC WITH DIFFERENTIAL/PLATELET
Abs Immature Granulocytes: 0.03 10*3/uL (ref 0.00–0.07)
Basophils Absolute: 0 10*3/uL (ref 0.0–0.1)
Basophils Relative: 0 %
Eosinophils Absolute: 0 10*3/uL (ref 0.0–0.5)
Eosinophils Relative: 1 %
HCT: 42.2 % (ref 36.0–46.0)
Hemoglobin: 13.1 g/dL (ref 12.0–15.0)
Immature Granulocytes: 1 %
Lymphocytes Relative: 30 %
Lymphs Abs: 0.8 10*3/uL (ref 0.7–4.0)
MCH: 28.4 pg (ref 26.0–34.0)
MCHC: 31 g/dL (ref 30.0–36.0)
MCV: 91.5 fL (ref 80.0–100.0)
Monocytes Absolute: 0.3 10*3/uL (ref 0.1–1.0)
Monocytes Relative: 13 %
Neutro Abs: 1.4 10*3/uL — ABNORMAL LOW (ref 1.7–7.7)
Neutrophils Relative %: 55 %
Platelets: 199 10*3/uL (ref 150–400)
RBC: 4.61 MIL/uL (ref 3.87–5.11)
RDW: 15.7 % — ABNORMAL HIGH (ref 11.5–15.5)
WBC: 2.5 10*3/uL — ABNORMAL LOW (ref 4.0–10.5)
nRBC: 0 % (ref 0.0–0.2)

## 2019-01-07 LAB — BASIC METABOLIC PANEL
Anion gap: 10 (ref 5–15)
BUN: 18 mg/dL (ref 8–23)
CO2: 27 mmol/L (ref 22–32)
Calcium: 8.9 mg/dL (ref 8.9–10.3)
Chloride: 103 mmol/L (ref 98–111)
Creatinine, Ser: 1.19 mg/dL — ABNORMAL HIGH (ref 0.44–1.00)
GFR calc Af Amer: 49 mL/min — ABNORMAL LOW (ref >60)
GFR calc non Af Amer: 42 mL/min — ABNORMAL LOW (ref >60)
Glucose, Bld: 105 mg/dL — ABNORMAL HIGH (ref 70–99)
Potassium: 4.5 mmol/L (ref 3.5–5.1)
Sodium: 140 mmol/L (ref 135–145)

## 2019-01-07 MED ORDER — FILGRASTIM-SNDZ 480 MCG/0.8ML IJ SOSY
480.0000 ug | PREFILLED_SYRINGE | Freq: Once | INTRAMUSCULAR | Status: AC
  Administered 2019-01-07: 480 ug via SUBCUTANEOUS
  Filled 2019-01-07: qty 0.8

## 2019-01-07 NOTE — Progress Notes (Signed)
[facility] NOTE  Patient Care Team: Baxter Hire, MD as PCP - General (Internal Medicine) Emmaline Kluver., MD (Rheumatology) Dasher, Rayvon Char, MD (Dermatology)  CHIEF COMPLAINTS/PURPOSE OF CONSULTATION: Autoimmune neutropenia  #Autoimmune neutropenia-weekly Granix/CBC [Dr.Crocoran]; BMBx- no malignancy [Bone marrow aspirate and biopsy on 09/04/2017 revealed a hypercellular marrow for age with trilineage hematopoiesis.  There was abundant mature neutrophils.  Significant dyspoiesis or increased blasts was not identified.  Flow cytometry revealed a predominance of T lymphocytes (16% of all cells) with no abnormal phenotype.  There was a minor B-cell population (13% of lymphocytes) with slight kappa light chain excess.  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   #   temporal arteritis [2003] chronic prednisone/ 2.5 mg a day. Oncology History   No history exists.    HISTORY OF PRESENTING ILLNESS:  [name] 84 y.o.  female above history of autoimmune neutropenia is here for follow-up.  Patient supposed to go for her granddaughter's marriage decided to cancel her trip because of the Covid pandemic risk of infections.   Patient has not had any infectious episodes.   No fever no chills.  Appetite is good.  Complains of easy bruising on her feet.  No gum bleeding no nosebleeds.   Review of Systems  Constitutional: Negative for chills, diaphoresis, fever, malaise/fatigue and weight loss.  HENT: Negative for nosebleeds and sore throat.   Eyes: Negative for double vision.  Respiratory: Negative for cough, hemoptysis, sputum production, shortness of breath and wheezing.   Cardiovascular: Negative for chest pain, palpitations, orthopnea and leg swelling.  Gastrointestinal: Negative for blood in stool, constipation, diarrhea, heartburn, melena, nausea and vomiting.       Around parastomal hernia.  Genitourinary:  Negative for dysuria, frequency and urgency.  Musculoskeletal: Negative for back pain and joint pain.  Skin: Negative.  Negative for itching and rash.  Neurological: Negative for dizziness, tingling, focal weakness, weakness and headaches.  Endo/Heme/Allergies: Bruises/bleeds easily.  Psychiatric/Behavioral: Negative for depression. The patient is nervous/anxious. The patient does not have insomnia.      MEDICAL HISTORY:  Past Medical History:  Diagnosis Date  . Breast cancer ([place]) 1985   bilateral mastectomies  . Depression    1 time specific situation that she did not know how to deal wiht  . Diverticulosis   . Hiatal hernia   . History of Bell's palsy   . History of colon polyps   . History of nephrolithiasis   . History of pancreatitis   . Hyperlipidemia   . IBS (irritable bowel syndrome)   . Osteoporosis    osteopenia in neck  . Skin cancer   . Temporal arteritis ([place])     SURGICAL HISTORY: Past Surgical History:  Procedure Laterality Date  . AUGMENTATION MAMMAPLASTY Bilateral 2000  . COLON RESECTION SIGMOID N/A 10/13/2017   Procedure: COLON RESECTION SIGMOID;  Surgeon: Herbert Pun, MD;  Location: [facility] ORS;  Service: General;  Laterality: N/A;  . COLOSTOMY N/A 10/13/2017   Procedure: COLOSTOMY;  Surgeon: Herbert Pun, MD;  Location: [facility] ORS;  Service: General;  Laterality: N/A;  . LAPAROSCOPIC CHOLECYSTECTOMY  2009  . MASTECTOMY  1983   bilateral    SOCIAL HISTORY: Social History   Socioeconomic History  . Marital status: Widowed    Spouse name: Not on file  . Number of children: 2  . Years of education: college  . Highest education level: Not on file  Occupational History  . 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 Financial resource strain: Not on file  . Food insecurity    Worry: Not on file    Inability: Not on file  . Transportation needs    Medical: Not on file    Non-medical: Not on file  Tobacco Use   . Smoking status: Former Smoker    Packs/day: 0.25    Years: 30.00    Pack years: 7.50    Types: Cigarettes    Quit date: 05/17/2005    Years since quitting: 13.6  . Smokeless tobacco: Never Used  . Tobacco comment: quit 11 years ago  Substance and Sexual Activity  . Alcohol use: No    Alcohol/week: 0.0 standard drinks  . Drug use: No  . Sexual activity: Not Currently  Lifestyle  . Physical activity    Days per week: Not on file    Minutes per session: Not on file  . Stress: Not on file  Relationships  . Social Herbalist on phone: Not on file    Gets together: Not on file    Attends religious service: Not on file    Active member of club or organization: Not on file    Attends meetings of clubs or organizations: Not on file    Relationship status: Not on file  . Intimate partner violence    Fear of current or ex partner: Not on file    Emotionally abused: Not on file    Physically abused: Not on file    Forced sexual activity: Not on file  Other Topics Concern  . Not on file  Social History Narrative   Patient lives at home alone.    Patient is retired.    Patient has some college.    Patient has 2 children.    FAMILY HISTORY: Family History  Problem Relation Age of Onset  . Colon cancer Mother 37  . Scleroderma Father 8  . Alcoholism Brother   . Stomach cancer Neg Hx     ALLERGIES:  is allergic to morphine; benzalkonium chloride; escitalopram; hydralazine; neomycin-bacitracin zn-polymyx; and bacitracin-neomycin-polymyxin.  MEDICATIONS:  Current Outpatient Medications  Medication Sig Dispense Refill  . ALPRAZolam (XANAX) 0.25 MG tablet Take 0.25 mg by mouth 2 (two) times daily as needed.     . Ascorbic Acid (VITAMIN C) 1000 MG tablet Take 1,000 mg by mouth daily.    Marland Kitchen aspirin EC 81 MG tablet Take 81 mg daily by mouth.    . Biotin 1000 MCG tablet Take 1,000 mcg by mouth daily.     . Cholecalciferol (VITAMIN D3) 2000 units capsule Take 2,000  Units daily by mouth.     . Cyanocobalamin (B-12 PO) Take 2,500 mcg daily by mouth.     . levofloxacin (LEVAQUIN) 500 MG tablet Take 1 tablet (500 mg total) by mouth daily. 7 tablet 0  . Multiple Vitamins-Minerals (ZINC PO) Take by mouth.    . predniSONE (DELTASONE) 5 MG tablet Take 2.5 mg by mouth daily.     . traMADol (ULTRAM) 50 MG tablet Take 50 mg by mouth daily.      No current facility-administered medications for this visit.       Marland Kitchen  PHYSICAL EXAMINATION: ECOG PERFORMANCE STATUS: 0 - Asymptomatic  Vitals:   01/07/19 1033  BP: (!) 171/76  Pulse: 81  Resp: 16  Temp: 97.9 F (36.6 C)   Filed Weights   01/07/19 1033  Weight: 164 lb 9.6 oz (74.7 kg)  Physical Exam  Constitutional: She is oriented to person, place, and time and well-developed, well-nourished, and in no distress.  Alone.  HENT:  Head: Normocephalic and atraumatic.  Mouth/Throat: Oropharynx is clear and moist. No oropharyngeal exudate.  Eyes: Pupils are equal, round, and reactive to light.  Neck: Normal range of motion. Neck supple.  Cardiovascular: Normal rate and regular rhythm.  Pulmonary/Chest: No respiratory distress. She has no wheezes.  Abdominal: Soft. Bowel sounds are normal. She exhibits no distension and no mass. There is no abdominal tenderness. There is no rebound and no guarding.  Colostomy.  Parastomal hernia.  Musculoskeletal: Normal range of motion.        General: No tenderness or edema.  Neurological: She is alert and oriented to person, place, and time.  Skin: Skin is warm.  Psychiatric: Affect normal.  Anxious.     LABORATORY DATA:  I have reviewed the data as listed Lab Results  Component Value Date   WBC 2.5 (L) 01/07/2019   HGB 13.1 01/07/2019   HCT 42.2 01/07/2019   MCV 91.5 01/07/2019   PLT 199 01/07/2019   Recent Labs    04/02/18 1104 05/14/18 0935 06/25/18 0943 01/07/19 1016  NA 136 141 142 140  K 4.1 4.1 4.6 4.5  CL 102 103 104 103  CO2 27 27 27 27    GLUCOSE 114* 100* 110* 105*  BUN 18 18 15 18   CREATININE 0.85 0.93 0.92 1.19*  CALCIUM 9.0 9.5 9.4 8.9  GFRNONAA >60 58* 58* 42*  GFRAA >60 >60 >60 49*  PROT 7.2 7.5 7.9  --   ALBUMIN 4.0 4.2 4.3  --   AST 21 23 28   --   ALT 13 16 18   --   ALKPHOS 120 126 118  --   BILITOT 1.2 1.0 1.0  --     RADIOGRAPHIC STUDIES: I have personally reviewed the radiological images as listed and agreed with the findings in the report. No results found.  ASSESSMENT & PLAN:   Autoimmune neutropenia (HCC) # Autoimmune neutropenia- once a week- if ANC < 1.5- zaxio-patient's ANC today- 1.4.proceed weekly Granix.  Clinically asymptomatic. On prednisone 2.5mg /day.   # Hx of temporal arteritis- [ x 16 years on low dose 2.5 mg/day prednisone]-followed by rheumatology. Stable. .  # Body aches-STABLE; -from zarxio recommend claritin/tylenol prn.   # easy bruising- sec to steroids-monitor for now.  # Creatinine- elevated 1.19; recommend increase fluid intake.  # DISPOSITION: # zaxio today; # weekly cbc/zaxio x12.  # follow up with MD in 12 weeks- cbc/bmp-zaxio-Dr.B  All questions were answered. The patient knows to call the clinic with any problems, questions or concerns.    Cammie Sickle, MD 01/13/2019 1:11 PM

## 2019-01-07 NOTE — Assessment & Plan Note (Addendum)
#   Autoimmune neutropenia- once a week- if ANC < 1.5- zaxio-patient's ANC today- 1.4.proceed weekly Granix.  Clinically asymptomatic. On prednisone 2.5mg /day.   # Hx of temporal arteritis- [ x 16 years on low dose 2.5 mg/day prednisone]-followed by rheumatology. Stable. .  # Body aches-STABLE; -from zarxio recommend claritin/tylenol prn.   # easy bruising- sec to steroids-monitor for now.  # Creatinine- elevated 1.19; recommend increase fluid intake.  # DISPOSITION: # zaxio today; # weekly cbc/zaxio x12.  # follow up with MD in 12 weeks- cbc/bmp-zaxio-Dr.B

## 2019-01-14 ENCOUNTER — Inpatient Hospital Stay: Payer: Medicare Other

## 2019-01-14 ENCOUNTER — Other Ambulatory Visit: Payer: Self-pay

## 2019-01-14 DIAGNOSIS — D708 Other neutropenia: Secondary | ICD-10-CM

## 2019-01-14 DIAGNOSIS — D72819 Decreased white blood cell count, unspecified: Secondary | ICD-10-CM

## 2019-01-14 LAB — CBC WITH DIFFERENTIAL/PLATELET
Abs Immature Granulocytes: 0.01 10*3/uL (ref 0.00–0.07)
Basophils Absolute: 0 10*3/uL (ref 0.0–0.1)
Basophils Relative: 0 %
Eosinophils Absolute: 0 10*3/uL (ref 0.0–0.5)
Eosinophils Relative: 1 %
HCT: 44.2 % (ref 36.0–46.0)
Hemoglobin: 13.9 g/dL (ref 12.0–15.0)
Immature Granulocytes: 1 %
Lymphocytes Relative: 42 %
Lymphs Abs: 0.9 10*3/uL (ref 0.7–4.0)
MCH: 28.7 pg (ref 26.0–34.0)
MCHC: 31.4 g/dL (ref 30.0–36.0)
MCV: 91.3 fL (ref 80.0–100.0)
Monocytes Absolute: 0.3 10*3/uL (ref 0.1–1.0)
Monocytes Relative: 12 %
Neutro Abs: 1 10*3/uL — ABNORMAL LOW (ref 1.7–7.7)
Neutrophils Relative %: 44 %
Platelets: 219 10*3/uL (ref 150–400)
RBC: 4.84 MIL/uL (ref 3.87–5.11)
RDW: 15.5 % (ref 11.5–15.5)
WBC: 2.2 10*3/uL — ABNORMAL LOW (ref 4.0–10.5)
nRBC: 0 % (ref 0.0–0.2)

## 2019-01-14 MED ORDER — FILGRASTIM-SNDZ 480 MCG/0.8ML IJ SOSY
480.0000 ug | PREFILLED_SYRINGE | Freq: Once | INTRAMUSCULAR | Status: AC
  Administered 2019-01-14: 480 ug via SUBCUTANEOUS
  Filled 2019-01-14: qty 0.8

## 2019-01-21 ENCOUNTER — Inpatient Hospital Stay: Payer: Medicare Other

## 2019-01-21 ENCOUNTER — Other Ambulatory Visit: Payer: Self-pay

## 2019-01-21 DIAGNOSIS — D708 Other neutropenia: Secondary | ICD-10-CM

## 2019-01-21 DIAGNOSIS — D72819 Decreased white blood cell count, unspecified: Secondary | ICD-10-CM

## 2019-01-21 LAB — CBC WITH DIFFERENTIAL/PLATELET
Abs Immature Granulocytes: 0 10*3/uL (ref 0.00–0.07)
Band Neutrophils: 1 %
Basophils Absolute: 0 10*3/uL (ref 0.0–0.1)
Basophils Relative: 1 %
Eosinophils Absolute: 0.1 10*3/uL (ref 0.0–0.5)
Eosinophils Relative: 3 %
HCT: 43.4 % (ref 36.0–46.0)
Hemoglobin: 13.6 g/dL (ref 12.0–15.0)
Lymphocytes Relative: 41 %
Lymphs Abs: 1.1 10*3/uL (ref 0.7–4.0)
MCH: 29.1 pg (ref 26.0–34.0)
MCHC: 31.3 g/dL (ref 30.0–36.0)
MCV: 92.7 fL (ref 80.0–100.0)
Monocytes Absolute: 0.3 10*3/uL (ref 0.1–1.0)
Monocytes Relative: 9 %
Neutro Abs: 1.3 10*3/uL — ABNORMAL LOW (ref 1.7–7.7)
Neutrophils Relative %: 45 %
Platelets: 224 10*3/uL (ref 150–400)
RBC: 4.68 MIL/uL (ref 3.87–5.11)
RDW: 15.4 % (ref 11.5–15.5)
Smear Review: ADEQUATE
WBC: 2.8 10*3/uL — ABNORMAL LOW (ref 4.0–10.5)
nRBC: 0 % (ref 0.0–0.2)

## 2019-01-21 MED ORDER — FILGRASTIM-SNDZ 480 MCG/0.8ML IJ SOSY
480.0000 ug | PREFILLED_SYRINGE | Freq: Once | INTRAMUSCULAR | Status: AC
  Administered 2019-01-21: 480 ug via SUBCUTANEOUS
  Filled 2019-01-21: qty 0.8

## 2019-01-27 ENCOUNTER — Other Ambulatory Visit: Payer: Self-pay

## 2019-01-28 ENCOUNTER — Other Ambulatory Visit: Payer: Self-pay

## 2019-01-28 ENCOUNTER — Inpatient Hospital Stay: Payer: Medicare Other | Attending: Internal Medicine

## 2019-01-28 ENCOUNTER — Inpatient Hospital Stay: Payer: Medicare Other

## 2019-01-28 DIAGNOSIS — M316 Other giant cell arteritis: Secondary | ICD-10-CM | POA: Insufficient documentation

## 2019-01-28 DIAGNOSIS — Z800 Family history of malignant neoplasm of digestive organs: Secondary | ICD-10-CM | POA: Diagnosis not present

## 2019-01-28 DIAGNOSIS — R52 Pain, unspecified: Secondary | ICD-10-CM | POA: Diagnosis not present

## 2019-01-28 DIAGNOSIS — Z811 Family history of alcohol abuse and dependence: Secondary | ICD-10-CM | POA: Insufficient documentation

## 2019-01-28 DIAGNOSIS — D708 Other neutropenia: Secondary | ICD-10-CM | POA: Diagnosis present

## 2019-01-28 DIAGNOSIS — F419 Anxiety disorder, unspecified: Secondary | ICD-10-CM | POA: Diagnosis not present

## 2019-01-28 DIAGNOSIS — Z8719 Personal history of other diseases of the digestive system: Secondary | ICD-10-CM | POA: Diagnosis not present

## 2019-01-28 DIAGNOSIS — Z87891 Personal history of nicotine dependence: Secondary | ICD-10-CM | POA: Diagnosis not present

## 2019-01-28 DIAGNOSIS — Z79899 Other long term (current) drug therapy: Secondary | ICD-10-CM | POA: Insufficient documentation

## 2019-01-28 LAB — CBC WITH DIFFERENTIAL/PLATELET
Abs Immature Granulocytes: 0.07 10*3/uL (ref 0.00–0.07)
Basophils Absolute: 0 10*3/uL (ref 0.0–0.1)
Basophils Relative: 0 %
Eosinophils Absolute: 0 10*3/uL (ref 0.0–0.5)
Eosinophils Relative: 1 %
HCT: 43.3 % (ref 36.0–46.0)
Hemoglobin: 13.5 g/dL (ref 12.0–15.0)
Immature Granulocytes: 2 %
Lymphocytes Relative: 28 %
Lymphs Abs: 0.8 10*3/uL (ref 0.7–4.0)
MCH: 28.5 pg (ref 26.0–34.0)
MCHC: 31.2 g/dL (ref 30.0–36.0)
MCV: 91.5 fL (ref 80.0–100.0)
Monocytes Absolute: 0.3 10*3/uL (ref 0.1–1.0)
Monocytes Relative: 10 %
Neutro Abs: 1.7 10*3/uL (ref 1.7–7.7)
Neutrophils Relative %: 59 %
Platelets: 217 10*3/uL (ref 150–400)
RBC: 4.73 MIL/uL (ref 3.87–5.11)
RDW: 15.2 % (ref 11.5–15.5)
Smear Review: NORMAL
WBC: 2.9 10*3/uL — ABNORMAL LOW (ref 4.0–10.5)
nRBC: 0 % (ref 0.0–0.2)

## 2019-02-04 ENCOUNTER — Inpatient Hospital Stay: Payer: Medicare Other

## 2019-02-04 ENCOUNTER — Other Ambulatory Visit: Payer: Self-pay

## 2019-02-04 DIAGNOSIS — D708 Other neutropenia: Secondary | ICD-10-CM

## 2019-02-04 DIAGNOSIS — D72819 Decreased white blood cell count, unspecified: Secondary | ICD-10-CM

## 2019-02-04 LAB — CBC WITH DIFFERENTIAL/PLATELET
Abs Immature Granulocytes: 0.06 10*3/uL (ref 0.00–0.07)
Basophils Absolute: 0 10*3/uL (ref 0.0–0.1)
Basophils Relative: 1 %
Eosinophils Absolute: 0 10*3/uL (ref 0.0–0.5)
Eosinophils Relative: 1 %
HCT: 44.4 % (ref 36.0–46.0)
Hemoglobin: 13.7 g/dL (ref 12.0–15.0)
Immature Granulocytes: 3 %
Lymphocytes Relative: 44 %
Lymphs Abs: 1 10*3/uL (ref 0.7–4.0)
MCH: 28.5 pg (ref 26.0–34.0)
MCHC: 30.9 g/dL (ref 30.0–36.0)
MCV: 92.3 fL (ref 80.0–100.0)
Monocytes Absolute: 0.3 10*3/uL (ref 0.1–1.0)
Monocytes Relative: 12 %
Neutro Abs: 0.8 10*3/uL — ABNORMAL LOW (ref 1.7–7.7)
Neutrophils Relative %: 39 %
Platelets: 226 10*3/uL (ref 150–400)
RBC: 4.81 MIL/uL (ref 3.87–5.11)
RDW: 15.4 % (ref 11.5–15.5)
WBC: 2.1 10*3/uL — ABNORMAL LOW (ref 4.0–10.5)
nRBC: 0 % (ref 0.0–0.2)

## 2019-02-04 MED ORDER — FILGRASTIM-SNDZ 480 MCG/0.8ML IJ SOSY
480.0000 ug | PREFILLED_SYRINGE | Freq: Once | INTRAMUSCULAR | Status: AC
  Administered 2019-02-04: 11:00:00 480 ug via SUBCUTANEOUS
  Filled 2019-02-04: qty 0.8

## 2019-02-11 ENCOUNTER — Inpatient Hospital Stay: Payer: Medicare Other

## 2019-02-11 ENCOUNTER — Other Ambulatory Visit: Payer: Self-pay

## 2019-02-11 DIAGNOSIS — D708 Other neutropenia: Secondary | ICD-10-CM | POA: Diagnosis not present

## 2019-02-11 DIAGNOSIS — D72819 Decreased white blood cell count, unspecified: Secondary | ICD-10-CM

## 2019-02-11 LAB — CBC WITH DIFFERENTIAL/PLATELET
Abs Immature Granulocytes: 0.02 10*3/uL (ref 0.00–0.07)
Basophils Absolute: 0 10*3/uL (ref 0.0–0.1)
Basophils Relative: 0 %
Eosinophils Absolute: 0 10*3/uL (ref 0.0–0.5)
Eosinophils Relative: 1 %
HCT: 45 % (ref 36.0–46.0)
Hemoglobin: 13.9 g/dL (ref 12.0–15.0)
Immature Granulocytes: 1 %
Lymphocytes Relative: 42 %
Lymphs Abs: 1 10*3/uL (ref 0.7–4.0)
MCH: 28.7 pg (ref 26.0–34.0)
MCHC: 30.9 g/dL (ref 30.0–36.0)
MCV: 92.8 fL (ref 80.0–100.0)
Monocytes Absolute: 0.3 10*3/uL (ref 0.1–1.0)
Monocytes Relative: 12 %
Neutro Abs: 1 10*3/uL — ABNORMAL LOW (ref 1.7–7.7)
Neutrophils Relative %: 44 %
Platelets: 212 10*3/uL (ref 150–400)
RBC: 4.85 MIL/uL (ref 3.87–5.11)
RDW: 15.3 % (ref 11.5–15.5)
Smear Review: NORMAL
WBC: 2.3 10*3/uL — ABNORMAL LOW (ref 4.0–10.5)
nRBC: 0 % (ref 0.0–0.2)

## 2019-02-11 MED ORDER — FILGRASTIM-SNDZ 480 MCG/0.8ML IJ SOSY
480.0000 ug | PREFILLED_SYRINGE | Freq: Once | INTRAMUSCULAR | Status: AC
  Administered 2019-02-11: 480 ug via SUBCUTANEOUS
  Filled 2019-02-11: qty 0.8

## 2019-02-18 ENCOUNTER — Inpatient Hospital Stay: Payer: Medicare Other

## 2019-02-18 ENCOUNTER — Other Ambulatory Visit: Payer: Self-pay

## 2019-02-18 DIAGNOSIS — D708 Other neutropenia: Secondary | ICD-10-CM

## 2019-02-18 DIAGNOSIS — D72819 Decreased white blood cell count, unspecified: Secondary | ICD-10-CM

## 2019-02-18 LAB — CBC WITH DIFFERENTIAL/PLATELET
Abs Immature Granulocytes: 0.08 10*3/uL — ABNORMAL HIGH (ref 0.00–0.07)
Basophils Absolute: 0 10*3/uL (ref 0.0–0.1)
Basophils Relative: 0 %
Eosinophils Absolute: 0 10*3/uL (ref 0.0–0.5)
Eosinophils Relative: 0 %
HCT: 44.4 % (ref 36.0–46.0)
Hemoglobin: 13.7 g/dL (ref 12.0–15.0)
Immature Granulocytes: 3 %
Lymphocytes Relative: 31 %
Lymphs Abs: 0.8 10*3/uL (ref 0.7–4.0)
MCH: 29 pg (ref 26.0–34.0)
MCHC: 30.9 g/dL (ref 30.0–36.0)
MCV: 93.9 fL (ref 80.0–100.0)
Monocytes Absolute: 0.2 10*3/uL (ref 0.1–1.0)
Monocytes Relative: 8 %
Neutro Abs: 1.4 10*3/uL — ABNORMAL LOW (ref 1.7–7.7)
Neutrophils Relative %: 58 %
Platelets: 189 10*3/uL (ref 150–400)
RBC: 4.73 MIL/uL (ref 3.87–5.11)
RDW: 15.2 % (ref 11.5–15.5)
WBC: 2.5 10*3/uL — ABNORMAL LOW (ref 4.0–10.5)
nRBC: 0 % (ref 0.0–0.2)

## 2019-02-18 MED ORDER — FILGRASTIM-SNDZ 480 MCG/0.8ML IJ SOSY
480.0000 ug | PREFILLED_SYRINGE | Freq: Once | INTRAMUSCULAR | Status: AC
  Administered 2019-02-18: 11:00:00 480 ug via SUBCUTANEOUS
  Filled 2019-02-18: qty 0.8

## 2019-02-24 ENCOUNTER — Other Ambulatory Visit: Payer: Self-pay

## 2019-02-25 ENCOUNTER — Inpatient Hospital Stay: Payer: Medicare Other

## 2019-02-25 ENCOUNTER — Other Ambulatory Visit: Payer: Self-pay

## 2019-02-25 DIAGNOSIS — D708 Other neutropenia: Secondary | ICD-10-CM

## 2019-02-25 DIAGNOSIS — D72819 Decreased white blood cell count, unspecified: Secondary | ICD-10-CM

## 2019-02-25 LAB — CBC WITH DIFFERENTIAL/PLATELET
Abs Immature Granulocytes: 0.05 10*3/uL (ref 0.00–0.07)
Basophils Absolute: 0 10*3/uL (ref 0.0–0.1)
Basophils Relative: 0 %
Eosinophils Absolute: 0 10*3/uL (ref 0.0–0.5)
Eosinophils Relative: 1 %
HCT: 44 % (ref 36.0–46.0)
Hemoglobin: 13.7 g/dL (ref 12.0–15.0)
Immature Granulocytes: 2 %
Lymphocytes Relative: 31 %
Lymphs Abs: 0.7 10*3/uL (ref 0.7–4.0)
MCH: 28.7 pg (ref 26.0–34.0)
MCHC: 31.1 g/dL (ref 30.0–36.0)
MCV: 92.1 fL (ref 80.0–100.0)
Monocytes Absolute: 0.3 10*3/uL (ref 0.1–1.0)
Monocytes Relative: 12 %
Neutro Abs: 1.2 10*3/uL — ABNORMAL LOW (ref 1.7–7.7)
Neutrophils Relative %: 54 %
Platelets: 216 10*3/uL (ref 150–400)
RBC: 4.78 MIL/uL (ref 3.87–5.11)
RDW: 14.9 % (ref 11.5–15.5)
Smear Review: NORMAL
WBC: 2.4 10*3/uL — ABNORMAL LOW (ref 4.0–10.5)
nRBC: 0 % (ref 0.0–0.2)

## 2019-02-25 MED ORDER — FILGRASTIM-SNDZ 480 MCG/0.8ML IJ SOSY
480.0000 ug | PREFILLED_SYRINGE | Freq: Once | INTRAMUSCULAR | Status: AC
  Administered 2019-02-25: 480 ug via SUBCUTANEOUS
  Filled 2019-02-25: qty 0.8

## 2019-03-04 ENCOUNTER — Inpatient Hospital Stay: Payer: Medicare Other

## 2019-03-04 ENCOUNTER — Inpatient Hospital Stay: Payer: Medicare Other | Attending: Internal Medicine

## 2019-03-04 ENCOUNTER — Other Ambulatory Visit: Payer: Self-pay

## 2019-03-04 DIAGNOSIS — M316 Other giant cell arteritis: Secondary | ICD-10-CM | POA: Diagnosis not present

## 2019-03-04 DIAGNOSIS — Z853 Personal history of malignant neoplasm of breast: Secondary | ICD-10-CM | POA: Diagnosis not present

## 2019-03-04 DIAGNOSIS — Z87891 Personal history of nicotine dependence: Secondary | ICD-10-CM | POA: Insufficient documentation

## 2019-03-04 DIAGNOSIS — Z79899 Other long term (current) drug therapy: Secondary | ICD-10-CM | POA: Insufficient documentation

## 2019-03-04 DIAGNOSIS — Z9013 Acquired absence of bilateral breasts and nipples: Secondary | ICD-10-CM | POA: Diagnosis not present

## 2019-03-04 DIAGNOSIS — D72819 Decreased white blood cell count, unspecified: Secondary | ICD-10-CM

## 2019-03-04 DIAGNOSIS — D708 Other neutropenia: Secondary | ICD-10-CM | POA: Diagnosis present

## 2019-03-04 DIAGNOSIS — F419 Anxiety disorder, unspecified: Secondary | ICD-10-CM | POA: Insufficient documentation

## 2019-03-04 LAB — CBC WITH DIFFERENTIAL/PLATELET
Abs Immature Granulocytes: 0 10*3/uL (ref 0.00–0.07)
Band Neutrophils: 7 %
Basophils Absolute: 0 10*3/uL (ref 0.0–0.1)
Basophils Relative: 1 %
Eosinophils Absolute: 0 10*3/uL (ref 0.0–0.5)
Eosinophils Relative: 1 %
HCT: 43.8 % (ref 36.0–46.0)
Hemoglobin: 13.9 g/dL (ref 12.0–15.0)
Lymphocytes Relative: 45 %
Lymphs Abs: 0.9 10*3/uL (ref 0.7–4.0)
MCH: 29.3 pg (ref 26.0–34.0)
MCHC: 31.7 g/dL (ref 30.0–36.0)
MCV: 92.4 fL (ref 80.0–100.0)
Monocytes Absolute: 0.1 10*3/uL (ref 0.1–1.0)
Monocytes Relative: 7 %
Neutro Abs: 1 10*3/uL — ABNORMAL LOW (ref 1.7–7.7)
Neutrophils Relative %: 39 %
Platelets: 226 10*3/uL (ref 150–400)
RBC: 4.74 MIL/uL (ref 3.87–5.11)
RDW: 15 % (ref 11.5–15.5)
Smear Review: NORMAL
WBC Morphology: ABNORMAL
WBC: 2.1 10*3/uL — ABNORMAL LOW (ref 4.0–10.5)
nRBC: 0 % (ref 0.0–0.2)

## 2019-03-04 MED ORDER — FILGRASTIM-SNDZ 480 MCG/0.8ML IJ SOSY
480.0000 ug | PREFILLED_SYRINGE | Freq: Once | INTRAMUSCULAR | Status: AC
  Administered 2019-03-04: 480 ug via SUBCUTANEOUS
  Filled 2019-03-04: qty 0.8

## 2019-03-10 ENCOUNTER — Other Ambulatory Visit: Payer: Self-pay

## 2019-03-11 ENCOUNTER — Other Ambulatory Visit: Payer: Self-pay

## 2019-03-11 ENCOUNTER — Inpatient Hospital Stay: Payer: Medicare Other

## 2019-03-11 DIAGNOSIS — D708 Other neutropenia: Secondary | ICD-10-CM

## 2019-03-11 DIAGNOSIS — D72819 Decreased white blood cell count, unspecified: Secondary | ICD-10-CM

## 2019-03-11 LAB — CBC WITH DIFFERENTIAL/PLATELET
Abs Immature Granulocytes: 0.01 10*3/uL (ref 0.00–0.07)
Basophils Absolute: 0 10*3/uL (ref 0.0–0.1)
Basophils Relative: 1 %
Eosinophils Absolute: 0 10*3/uL (ref 0.0–0.5)
Eosinophils Relative: 2 %
HCT: 42.8 % (ref 36.0–46.0)
Hemoglobin: 13.3 g/dL (ref 12.0–15.0)
Immature Granulocytes: 1 %
Lymphocytes Relative: 35 %
Lymphs Abs: 0.8 10*3/uL (ref 0.7–4.0)
MCH: 29.2 pg (ref 26.0–34.0)
MCHC: 31.1 g/dL (ref 30.0–36.0)
MCV: 94.1 fL (ref 80.0–100.0)
Monocytes Absolute: 0.2 10*3/uL (ref 0.1–1.0)
Monocytes Relative: 11 %
Neutro Abs: 1.1 10*3/uL — ABNORMAL LOW (ref 1.7–7.7)
Neutrophils Relative %: 50 %
Platelets: 203 10*3/uL (ref 150–400)
RBC: 4.55 MIL/uL (ref 3.87–5.11)
RDW: 15.2 % (ref 11.5–15.5)
Smear Review: NORMAL
WBC: 2.2 10*3/uL — ABNORMAL LOW (ref 4.0–10.5)
nRBC: 0 % (ref 0.0–0.2)

## 2019-03-11 MED ORDER — FILGRASTIM-SNDZ 480 MCG/0.8ML IJ SOSY
480.0000 ug | PREFILLED_SYRINGE | Freq: Once | INTRAMUSCULAR | Status: AC
  Administered 2019-03-11: 480 ug via SUBCUTANEOUS
  Filled 2019-03-11: qty 0.8

## 2019-03-18 ENCOUNTER — Inpatient Hospital Stay: Payer: Medicare Other

## 2019-03-18 ENCOUNTER — Other Ambulatory Visit: Payer: Self-pay

## 2019-03-18 DIAGNOSIS — D708 Other neutropenia: Secondary | ICD-10-CM

## 2019-03-18 DIAGNOSIS — D72819 Decreased white blood cell count, unspecified: Secondary | ICD-10-CM

## 2019-03-18 LAB — CBC WITH DIFFERENTIAL/PLATELET
Abs Immature Granulocytes: 0.03 10*3/uL (ref 0.00–0.07)
Basophils Absolute: 0 10*3/uL (ref 0.0–0.1)
Basophils Relative: 1 %
Eosinophils Absolute: 0 10*3/uL (ref 0.0–0.5)
Eosinophils Relative: 2 %
HCT: 42.1 % (ref 36.0–46.0)
Hemoglobin: 13 g/dL (ref 12.0–15.0)
Immature Granulocytes: 2 %
Lymphocytes Relative: 49 %
Lymphs Abs: 1 10*3/uL (ref 0.7–4.0)
MCH: 29 pg (ref 26.0–34.0)
MCHC: 30.9 g/dL (ref 30.0–36.0)
MCV: 94 fL (ref 80.0–100.0)
Monocytes Absolute: 0.3 10*3/uL (ref 0.1–1.0)
Monocytes Relative: 14 %
Neutro Abs: 0.6 10*3/uL — ABNORMAL LOW (ref 1.7–7.7)
Neutrophils Relative %: 32 %
Platelets: 164 10*3/uL (ref 150–400)
RBC: 4.48 MIL/uL (ref 3.87–5.11)
RDW: 14.7 % (ref 11.5–15.5)
WBC: 2 10*3/uL — ABNORMAL LOW (ref 4.0–10.5)
nRBC: 0 % (ref 0.0–0.2)

## 2019-03-18 MED ORDER — FILGRASTIM-SNDZ 480 MCG/0.8ML IJ SOSY
480.0000 ug | PREFILLED_SYRINGE | Freq: Once | INTRAMUSCULAR | Status: AC
  Administered 2019-03-18: 11:00:00 480 ug via SUBCUTANEOUS
  Filled 2019-03-18: qty 0.8

## 2019-03-24 ENCOUNTER — Other Ambulatory Visit: Payer: Self-pay

## 2019-03-25 ENCOUNTER — Inpatient Hospital Stay: Payer: Medicare Other

## 2019-03-25 ENCOUNTER — Other Ambulatory Visit: Payer: Self-pay

## 2019-03-25 DIAGNOSIS — D72819 Decreased white blood cell count, unspecified: Secondary | ICD-10-CM

## 2019-03-25 DIAGNOSIS — D708 Other neutropenia: Secondary | ICD-10-CM

## 2019-03-25 LAB — CBC WITH DIFFERENTIAL/PLATELET
Abs Immature Granulocytes: 0 10*3/uL (ref 0.00–0.07)
Band Neutrophils: 6 %
Basophils Absolute: 0 10*3/uL (ref 0.0–0.1)
Basophils Relative: 1 %
Eosinophils Absolute: 0 10*3/uL (ref 0.0–0.5)
Eosinophils Relative: 0 %
HCT: 43.3 % (ref 36.0–46.0)
Hemoglobin: 13.4 g/dL (ref 12.0–15.0)
Lymphocytes Relative: 44 %
Lymphs Abs: 1.1 10*3/uL (ref 0.7–4.0)
MCH: 29.2 pg (ref 26.0–34.0)
MCHC: 30.9 g/dL (ref 30.0–36.0)
MCV: 94.3 fL (ref 80.0–100.0)
Monocytes Absolute: 0.1 10*3/uL (ref 0.1–1.0)
Monocytes Relative: 6 %
Neutro Abs: 1.2 10*3/uL — ABNORMAL LOW (ref 1.7–7.7)
Neutrophils Relative %: 43 %
Platelets: 200 10*3/uL (ref 150–400)
RBC: 4.59 MIL/uL (ref 3.87–5.11)
RDW: 14.9 % (ref 11.5–15.5)
WBC: 2.4 10*3/uL — ABNORMAL LOW (ref 4.0–10.5)
nRBC: 0 % (ref 0.0–0.2)

## 2019-03-25 MED ORDER — FILGRASTIM-SNDZ 480 MCG/0.8ML IJ SOSY
480.0000 ug | PREFILLED_SYRINGE | Freq: Once | INTRAMUSCULAR | Status: AC
  Administered 2019-03-25: 11:00:00 480 ug via SUBCUTANEOUS
  Filled 2019-03-25: qty 0.8

## 2019-04-01 ENCOUNTER — Inpatient Hospital Stay: Payer: Medicare Other

## 2019-04-01 ENCOUNTER — Encounter: Payer: Self-pay | Admitting: Internal Medicine

## 2019-04-01 ENCOUNTER — Inpatient Hospital Stay (HOSPITAL_BASED_OUTPATIENT_CLINIC_OR_DEPARTMENT_OTHER): Payer: Medicare Other | Admitting: Internal Medicine

## 2019-04-01 ENCOUNTER — Inpatient Hospital Stay: Payer: Medicare Other | Attending: Internal Medicine

## 2019-04-01 ENCOUNTER — Other Ambulatory Visit: Payer: Self-pay

## 2019-04-01 DIAGNOSIS — D708 Other neutropenia: Secondary | ICD-10-CM

## 2019-04-01 DIAGNOSIS — M2550 Pain in unspecified joint: Secondary | ICD-10-CM | POA: Insufficient documentation

## 2019-04-01 DIAGNOSIS — M316 Other giant cell arteritis: Secondary | ICD-10-CM | POA: Insufficient documentation

## 2019-04-01 DIAGNOSIS — M359 Systemic involvement of connective tissue, unspecified: Secondary | ICD-10-CM | POA: Insufficient documentation

## 2019-04-01 DIAGNOSIS — Z79899 Other long term (current) drug therapy: Secondary | ICD-10-CM | POA: Insufficient documentation

## 2019-04-01 DIAGNOSIS — D72819 Decreased white blood cell count, unspecified: Secondary | ICD-10-CM | POA: Insufficient documentation

## 2019-04-01 DIAGNOSIS — Z23 Encounter for immunization: Secondary | ICD-10-CM | POA: Insufficient documentation

## 2019-04-01 LAB — CBC WITH DIFFERENTIAL/PLATELET
Abs Immature Granulocytes: 0.04 10*3/uL (ref 0.00–0.07)
Basophils Absolute: 0 10*3/uL (ref 0.0–0.1)
Basophils Relative: 0 %
Eosinophils Absolute: 0 10*3/uL (ref 0.0–0.5)
Eosinophils Relative: 1 %
HCT: 44.3 % (ref 36.0–46.0)
Hemoglobin: 13.8 g/dL (ref 12.0–15.0)
Immature Granulocytes: 2 %
Lymphocytes Relative: 40 %
Lymphs Abs: 0.9 10*3/uL (ref 0.7–4.0)
MCH: 29.3 pg (ref 26.0–34.0)
MCHC: 31.2 g/dL (ref 30.0–36.0)
MCV: 94.1 fL (ref 80.0–100.0)
Monocytes Absolute: 0.3 10*3/uL (ref 0.1–1.0)
Monocytes Relative: 12 %
Neutro Abs: 1 10*3/uL — ABNORMAL LOW (ref 1.7–7.7)
Neutrophils Relative %: 45 %
Platelets: 198 10*3/uL (ref 150–400)
RBC: 4.71 MIL/uL (ref 3.87–5.11)
RDW: 14.8 % (ref 11.5–15.5)
WBC: 2.2 10*3/uL — ABNORMAL LOW (ref 4.0–10.5)
nRBC: 0 % (ref 0.0–0.2)

## 2019-04-01 LAB — BASIC METABOLIC PANEL
Anion gap: 10 (ref 5–15)
BUN: 19 mg/dL (ref 8–23)
CO2: 27 mmol/L (ref 22–32)
Calcium: 9.2 mg/dL (ref 8.9–10.3)
Chloride: 103 mmol/L (ref 98–111)
Creatinine, Ser: 0.89 mg/dL (ref 0.44–1.00)
GFR calc Af Amer: >60 mL/min (ref >60)
GFR calc non Af Amer: >60 mL/min (ref >60)
Glucose, Bld: 89 mg/dL (ref 70–99)
Potassium: 4.5 mmol/L (ref 3.5–5.1)
Sodium: 140 mmol/L (ref 135–145)

## 2019-04-01 MED ORDER — FILGRASTIM-SNDZ 480 MCG/0.8ML IJ SOSY
480.0000 ug | PREFILLED_SYRINGE | Freq: Once | INTRAMUSCULAR | Status: AC
  Administered 2019-04-01: 480 ug via SUBCUTANEOUS
  Filled 2019-04-01: qty 0.8

## 2019-04-01 MED ORDER — INFLUENZA VAC A&B SA ADJ QUAD 0.5 ML IM PRSY
0.5000 mL | PREFILLED_SYRINGE | Freq: Once | INTRAMUSCULAR | Status: AC
  Administered 2019-04-01: 11:00:00 0.5 mL via INTRAMUSCULAR
  Filled 2019-04-01: qty 0.5

## 2019-04-01 NOTE — Progress Notes (Signed)
I connected with [name] on 04/01/19 at 10:15 AM EST by video enabled telemedicine visit and verified that I am speaking with the correct person using two identifiers.  I discussed the limitations, risks, security and privacy concerns of performing an evaluation and management service by telemedicine and the availability of in-person appointments. I also discussed with the patient that there may be a patient responsible charge related to this service. The patient expressed understanding and agreed to proceed.   Other persons participating in the visit and their role in the encounter: RN/medical reconciliation Patient's location: Office Provider's location: Home  Oncology History   No history exists.    Chief Complaint: Neutropenia  History of present illness:[name] 84 y.o.  female with history of autoimmune neutropenia currently on growth factor support is here for follow-up.  Patient has not had any recent infectious episodes needing antibiotics or hospitalizations.  Patient has been pretty much staying at home avoiding gatherings and family visits.  She feels frustrated/and tired of the home isolation.  At the same time she is very concerned about catching infection.   She states that her temporal arteritis is overall fairly stable.  She continues to be on 2.5 prednisone.   She noted to have intermittent joint pains on the growth factor injections.  This is again under control while taking Claritin.  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 continue weekly zarxio.  Clinically asymptomatic. On prednisone 2.5mg /day.   # Hx of temporal arteritis- [>20 years on low dose 2.5 mg/day prednisone]-followed by rheumatology.  Stable.  However I would defer to rheumatology if prednisone dose needs to be increased; it is perfectly okay from hematology  standpoint.  # Body aches-stable; -from zarxio recommend claritin/tylenol prn.   # Counseling: I counseled the patient at length regarding the ongoing need for growth factor support given her risk of infections/Covid pandemic.  I discussed with the patient that I think is reasonable to spend time with family making sure of the family members are at low risk of infection/continue social distancing and hygiene etc.  # DISPOSITION: # zaxio today; # weekly cbc/zaxio x12.  # follow up with MD in 12 weeks- cbc/bmp-zaxio-Dr.B  Follow-up instructions:  I discussed the assessment and treatment plan with the patient.  The patient was provided an opportunity to ask questions and all were answered.  The patient agreed with the plan and demonstrated understanding of instructions.  The patient was advised to call back or seek an in person evaluation if the symptoms worsen or if the condition fails to improve as anticipated.  Dr. Charlaine Dalton [place] at [facility] 04/01/2019 11:04 AM

## 2019-04-01 NOTE — Assessment & Plan Note (Addendum)
#   Autoimmune neutropenia- once a week- if ANC < 1.5- zaxio-patient's ANC today- 1.0. continue weekly zarxio.  Clinically asymptomatic. On prednisone 2.5mg /day.   # Hx of temporal arteritis- [>20 years on low dose 2.5 mg/day prednisone]-followed by rheumatology.  Stable.  However I would defer to rheumatology if prednisone dose needs to be increased; it is perfectly okay from hematology standpoint.  # Body aches-stable; -from zarxio recommend claritin/tylenol prn.   # Counseling: I counseled the patient at length regarding the ongoing need for growth factor support given her risk of infections/Covid pandemic.  I discussed with the patient that I think is reasonable to spend time with family making sure of the family members are at low risk of infection/continue social distancing and hygiene etc. she is encouraged to have a flu shot.  # DISPOSITION: # zaxio today; # weekly cbc/zaxio x12.  # follow up with MD in 12 weeks- cbc/bmp-zaxio-Dr.B

## 2019-04-08 ENCOUNTER — Inpatient Hospital Stay: Payer: Medicare Other

## 2019-04-08 ENCOUNTER — Other Ambulatory Visit: Payer: Self-pay

## 2019-04-08 DIAGNOSIS — D72819 Decreased white blood cell count, unspecified: Secondary | ICD-10-CM

## 2019-04-08 DIAGNOSIS — D708 Other neutropenia: Secondary | ICD-10-CM

## 2019-04-08 LAB — CBC WITH DIFFERENTIAL/PLATELET
Abs Immature Granulocytes: 0 10*3/uL (ref 0.00–0.07)
Basophils Absolute: 0 10*3/uL (ref 0.0–0.1)
Basophils Relative: 0 %
Eosinophils Absolute: 0 10*3/uL (ref 0.0–0.5)
Eosinophils Relative: 2 %
HCT: 42.7 % (ref 36.0–46.0)
Hemoglobin: 13.5 g/dL (ref 12.0–15.0)
Immature Granulocytes: 0 %
Lymphocytes Relative: 38 %
Lymphs Abs: 0.9 10*3/uL (ref 0.7–4.0)
MCH: 29.6 pg (ref 26.0–34.0)
MCHC: 31.6 g/dL (ref 30.0–36.0)
MCV: 93.6 fL (ref 80.0–100.0)
Monocytes Absolute: 0.3 10*3/uL (ref 0.1–1.0)
Monocytes Relative: 12 %
Neutro Abs: 1.1 10*3/uL — ABNORMAL LOW (ref 1.7–7.7)
Neutrophils Relative %: 48 %
Platelets: 205 10*3/uL (ref 150–400)
RBC: 4.56 MIL/uL (ref 3.87–5.11)
RDW: 14.8 % (ref 11.5–15.5)
Smear Review: ADEQUATE
WBC Morphology: REACTIVE
WBC: 2.3 10*3/uL — ABNORMAL LOW (ref 4.0–10.5)
nRBC: 0 % (ref 0.0–0.2)

## 2019-04-08 MED ORDER — FILGRASTIM-SNDZ 480 MCG/0.8ML IJ SOSY
480.0000 ug | PREFILLED_SYRINGE | Freq: Once | INTRAMUSCULAR | Status: AC
  Administered 2019-04-08: 480 ug via SUBCUTANEOUS
  Filled 2019-04-08: qty 0.8

## 2019-04-11 ENCOUNTER — Other Ambulatory Visit: Payer: Self-pay

## 2019-04-14 ENCOUNTER — Inpatient Hospital Stay: Payer: Medicare Other

## 2019-04-14 ENCOUNTER — Other Ambulatory Visit: Payer: Self-pay

## 2019-04-14 VITALS — Temp 96.9°F | Wt 168.5 lb

## 2019-04-14 DIAGNOSIS — D708 Other neutropenia: Secondary | ICD-10-CM

## 2019-04-14 DIAGNOSIS — D72819 Decreased white blood cell count, unspecified: Secondary | ICD-10-CM

## 2019-04-14 LAB — CBC WITH DIFFERENTIAL/PLATELET
Abs Immature Granulocytes: 0.02 10*3/uL (ref 0.00–0.07)
Basophils Absolute: 0 10*3/uL (ref 0.0–0.1)
Basophils Relative: 0 %
Eosinophils Absolute: 0 10*3/uL (ref 0.0–0.5)
Eosinophils Relative: 2 %
HCT: 44 % (ref 36.0–46.0)
Hemoglobin: 13.5 g/dL (ref 12.0–15.0)
Immature Granulocytes: 1 %
Lymphocytes Relative: 44 %
Lymphs Abs: 1.1 10*3/uL (ref 0.7–4.0)
MCH: 28.7 pg (ref 26.0–34.0)
MCHC: 30.7 g/dL (ref 30.0–36.0)
MCV: 93.4 fL (ref 80.0–100.0)
Monocytes Absolute: 0.3 10*3/uL (ref 0.1–1.0)
Monocytes Relative: 14 %
Neutro Abs: 0.9 10*3/uL — ABNORMAL LOW (ref 1.7–7.7)
Neutrophils Relative %: 39 %
Platelets: 192 10*3/uL (ref 150–400)
RBC: 4.71 MIL/uL (ref 3.87–5.11)
RDW: 14.7 % (ref 11.5–15.5)
WBC: 2.4 10*3/uL — ABNORMAL LOW (ref 4.0–10.5)
nRBC: 0 % (ref 0.0–0.2)

## 2019-04-14 MED ORDER — FILGRASTIM-SNDZ 480 MCG/0.8ML IJ SOSY
480.0000 ug | PREFILLED_SYRINGE | Freq: Once | INTRAMUSCULAR | Status: AC
  Administered 2019-04-14: 11:00:00 480 ug via SUBCUTANEOUS
  Filled 2019-04-14: qty 0.8

## 2019-04-15 ENCOUNTER — Inpatient Hospital Stay: Payer: Medicare Other

## 2019-04-22 ENCOUNTER — Inpatient Hospital Stay: Payer: Medicare Other

## 2019-04-22 ENCOUNTER — Other Ambulatory Visit: Payer: Self-pay

## 2019-04-22 DIAGNOSIS — D708 Other neutropenia: Secondary | ICD-10-CM

## 2019-04-22 DIAGNOSIS — D72819 Decreased white blood cell count, unspecified: Secondary | ICD-10-CM

## 2019-04-22 LAB — CBC WITH DIFFERENTIAL/PLATELET
Abs Immature Granulocytes: 0.01 10*3/uL (ref 0.00–0.07)
Basophils Absolute: 0 10*3/uL (ref 0.0–0.1)
Basophils Relative: 1 %
Eosinophils Absolute: 0.1 10*3/uL (ref 0.0–0.5)
Eosinophils Relative: 2 %
HCT: 42.8 % (ref 36.0–46.0)
Hemoglobin: 13.2 g/dL (ref 12.0–15.0)
Immature Granulocytes: 1 %
Lymphocytes Relative: 37 %
Lymphs Abs: 0.8 10*3/uL (ref 0.7–4.0)
MCH: 29.3 pg (ref 26.0–34.0)
MCHC: 30.8 g/dL (ref 30.0–36.0)
MCV: 94.9 fL (ref 80.0–100.0)
Monocytes Absolute: 0.2 10*3/uL (ref 0.1–1.0)
Monocytes Relative: 10 %
Neutro Abs: 1 10*3/uL — ABNORMAL LOW (ref 1.7–7.7)
Neutrophils Relative %: 49 %
Platelets: 188 10*3/uL (ref 150–400)
RBC: 4.51 MIL/uL (ref 3.87–5.11)
RDW: 15 % (ref 11.5–15.5)
Smear Review: NORMAL
WBC: 2.1 10*3/uL — ABNORMAL LOW (ref 4.0–10.5)
nRBC: 0 % (ref 0.0–0.2)

## 2019-04-22 MED ORDER — FILGRASTIM-SNDZ 480 MCG/0.8ML IJ SOSY
480.0000 ug | PREFILLED_SYRINGE | Freq: Once | INTRAMUSCULAR | Status: AC
  Administered 2019-04-22: 480 ug via SUBCUTANEOUS
  Filled 2019-04-22: qty 0.8

## 2019-04-29 ENCOUNTER — Other Ambulatory Visit: Payer: Self-pay

## 2019-04-29 ENCOUNTER — Inpatient Hospital Stay: Payer: Medicare Other

## 2019-04-29 ENCOUNTER — Inpatient Hospital Stay: Payer: Medicare Other | Attending: Internal Medicine

## 2019-04-29 DIAGNOSIS — Z79899 Other long term (current) drug therapy: Secondary | ICD-10-CM | POA: Diagnosis not present

## 2019-04-29 DIAGNOSIS — D708 Other neutropenia: Secondary | ICD-10-CM | POA: Diagnosis not present

## 2019-04-29 DIAGNOSIS — Z8679 Personal history of other diseases of the circulatory system: Secondary | ICD-10-CM | POA: Diagnosis not present

## 2019-04-29 DIAGNOSIS — D72819 Decreased white blood cell count, unspecified: Secondary | ICD-10-CM

## 2019-04-29 LAB — CBC WITH DIFFERENTIAL/PLATELET
Abs Immature Granulocytes: 0 10*3/uL (ref 0.00–0.07)
Band Neutrophils: 3 %
Basophils Absolute: 0 10*3/uL (ref 0.0–0.1)
Basophils Relative: 0 %
Eosinophils Absolute: 0.1 10*3/uL (ref 0.0–0.5)
Eosinophils Relative: 3 %
HCT: 44 % (ref 36.0–46.0)
Hemoglobin: 13.5 g/dL (ref 12.0–15.0)
Lymphocytes Relative: 37 %
Lymphs Abs: 0.7 10*3/uL (ref 0.7–4.0)
MCH: 29.2 pg (ref 26.0–34.0)
MCHC: 30.7 g/dL (ref 30.0–36.0)
MCV: 95 fL (ref 80.0–100.0)
Monocytes Absolute: 0.1 10*3/uL (ref 0.1–1.0)
Monocytes Relative: 7 %
Neutro Abs: 1.1 10*3/uL — ABNORMAL LOW (ref 1.7–7.7)
Neutrophils Relative %: 50 %
Platelets: 204 10*3/uL (ref 150–400)
RBC: 4.63 MIL/uL (ref 3.87–5.11)
RDW: 15 % (ref 11.5–15.5)
Smear Review: NORMAL
WBC: 2 10*3/uL — ABNORMAL LOW (ref 4.0–10.5)
nRBC: 0 % (ref 0.0–0.2)

## 2019-04-29 MED ORDER — FILGRASTIM-SNDZ 480 MCG/0.8ML IJ SOSY
480.0000 ug | PREFILLED_SYRINGE | Freq: Once | INTRAMUSCULAR | Status: AC
  Administered 2019-04-29: 480 ug via SUBCUTANEOUS
  Filled 2019-04-29: qty 0.8

## 2019-05-06 ENCOUNTER — Other Ambulatory Visit: Payer: Self-pay

## 2019-05-06 ENCOUNTER — Inpatient Hospital Stay: Payer: Medicare Other

## 2019-05-06 DIAGNOSIS — D708 Other neutropenia: Secondary | ICD-10-CM

## 2019-05-06 DIAGNOSIS — R61 Generalized hyperhidrosis: Secondary | ICD-10-CM

## 2019-05-06 DIAGNOSIS — D72819 Decreased white blood cell count, unspecified: Secondary | ICD-10-CM

## 2019-05-06 LAB — CBC WITH DIFFERENTIAL/PLATELET
Abs Immature Granulocytes: 0.04 10*3/uL (ref 0.00–0.07)
Basophils Absolute: 0 10*3/uL (ref 0.0–0.1)
Basophils Relative: 0 %
Eosinophils Absolute: 0 10*3/uL (ref 0.0–0.5)
Eosinophils Relative: 1 %
HCT: 44.1 % (ref 36.0–46.0)
Hemoglobin: 13.2 g/dL (ref 12.0–15.0)
Immature Granulocytes: 2 %
Lymphocytes Relative: 33 %
Lymphs Abs: 0.8 10*3/uL (ref 0.7–4.0)
MCH: 28.6 pg (ref 26.0–34.0)
MCHC: 29.9 g/dL — ABNORMAL LOW (ref 30.0–36.0)
MCV: 95.7 fL (ref 80.0–100.0)
Monocytes Absolute: 0.3 10*3/uL (ref 0.1–1.0)
Monocytes Relative: 10 %
Neutro Abs: 1.4 10*3/uL — ABNORMAL LOW (ref 1.7–7.7)
Neutrophils Relative %: 54 %
Platelets: 193 10*3/uL (ref 150–400)
RBC: 4.61 MIL/uL (ref 3.87–5.11)
RDW: 14.8 % (ref 11.5–15.5)
WBC: 2.5 10*3/uL — ABNORMAL LOW (ref 4.0–10.5)
nRBC: 0 % (ref 0.0–0.2)

## 2019-05-06 MED ORDER — FILGRASTIM-SNDZ 480 MCG/0.8ML IJ SOSY
480.0000 ug | PREFILLED_SYRINGE | Freq: Once | INTRAMUSCULAR | Status: AC
  Administered 2019-05-06: 12:00:00 480 ug via SUBCUTANEOUS
  Filled 2019-05-06: qty 0.8

## 2019-05-09 ENCOUNTER — Telehealth: Payer: Self-pay | Admitting: *Deleted

## 2019-05-09 NOTE — Telephone Encounter (Addendum)
Patient called reporting that she is having a strange upset stomach and is asking if the injection she gets for her white count could be causing this. Please advise

## 2019-05-12 ENCOUNTER — Telehealth: Payer: Self-pay | Admitting: Internal Medicine

## 2019-05-12 NOTE — Telephone Encounter (Signed)
Spoke to patient regarding her concerns for growth factor support-episode of nausea vomiting abdominal discomfort post injection.  Currently resolved.  Suspect-viral cause rather than from postop support.  If continues to have above issues would recommend switching growth factor support.  Will check with insurance.  Patient will let us know on 12/15-if she has recurrent symptoms.

## 2019-05-13 ENCOUNTER — Inpatient Hospital Stay: Payer: Medicare Other

## 2019-05-13 ENCOUNTER — Other Ambulatory Visit: Payer: Self-pay

## 2019-05-13 DIAGNOSIS — D708 Other neutropenia: Secondary | ICD-10-CM

## 2019-05-13 DIAGNOSIS — D72819 Decreased white blood cell count, unspecified: Secondary | ICD-10-CM

## 2019-05-13 LAB — CBC WITH DIFFERENTIAL/PLATELET
Abs Immature Granulocytes: 0 10*3/uL (ref 0.00–0.07)
Basophils Absolute: 0 10*3/uL (ref 0.0–0.1)
Basophils Relative: 1 %
Eosinophils Absolute: 0 10*3/uL (ref 0.0–0.5)
Eosinophils Relative: 2 %
HCT: 45.4 % (ref 36.0–46.0)
Hemoglobin: 13.8 g/dL (ref 12.0–15.0)
Immature Granulocytes: 0 %
Lymphocytes Relative: 47 %
Lymphs Abs: 1 10*3/uL (ref 0.7–4.0)
MCH: 29.1 pg (ref 26.0–34.0)
MCHC: 30.4 g/dL (ref 30.0–36.0)
MCV: 95.8 fL (ref 80.0–100.0)
Monocytes Absolute: 0.3 10*3/uL (ref 0.1–1.0)
Monocytes Relative: 13 %
Neutro Abs: 0.7 10*3/uL — ABNORMAL LOW (ref 1.7–7.7)
Neutrophils Relative %: 37 %
Platelets: 208 10*3/uL (ref 150–400)
RBC: 4.74 MIL/uL (ref 3.87–5.11)
RDW: 14.6 % (ref 11.5–15.5)
WBC: 2 10*3/uL — ABNORMAL LOW (ref 4.0–10.5)
nRBC: 0 % (ref 0.0–0.2)

## 2019-05-13 MED ORDER — FILGRASTIM-SNDZ 480 MCG/0.8ML IJ SOSY
480.0000 ug | PREFILLED_SYRINGE | Freq: Once | INTRAMUSCULAR | Status: AC
  Administered 2019-05-13: 480 ug via SUBCUTANEOUS
  Filled 2019-05-13: qty 0.8

## 2019-05-19 ENCOUNTER — Other Ambulatory Visit: Payer: Self-pay

## 2019-05-20 ENCOUNTER — Other Ambulatory Visit: Payer: Self-pay

## 2019-05-20 ENCOUNTER — Inpatient Hospital Stay: Payer: Medicare Other | Admitting: *Deleted

## 2019-05-20 ENCOUNTER — Inpatient Hospital Stay: Payer: Medicare Other

## 2019-05-20 DIAGNOSIS — D708 Other neutropenia: Secondary | ICD-10-CM | POA: Diagnosis not present

## 2019-05-20 DIAGNOSIS — D72819 Decreased white blood cell count, unspecified: Secondary | ICD-10-CM

## 2019-05-20 LAB — CBC WITH DIFFERENTIAL/PLATELET
Abs Immature Granulocytes: 0.02 10*3/uL (ref 0.00–0.07)
Basophils Absolute: 0 10*3/uL (ref 0.0–0.1)
Basophils Relative: 1 %
Eosinophils Absolute: 0.1 10*3/uL (ref 0.0–0.5)
Eosinophils Relative: 3 %
HCT: 43.8 % (ref 36.0–46.0)
Hemoglobin: 13.4 g/dL (ref 12.0–15.0)
Immature Granulocytes: 1 %
Lymphocytes Relative: 51 %
Lymphs Abs: 1.1 10*3/uL (ref 0.7–4.0)
MCH: 29.3 pg (ref 26.0–34.0)
MCHC: 30.6 g/dL (ref 30.0–36.0)
MCV: 95.6 fL (ref 80.0–100.0)
Monocytes Absolute: 0.3 10*3/uL (ref 0.1–1.0)
Monocytes Relative: 13 %
Neutro Abs: 0.7 10*3/uL — ABNORMAL LOW (ref 1.7–7.7)
Neutrophils Relative %: 31 %
Platelets: 178 10*3/uL (ref 150–400)
RBC: 4.58 MIL/uL (ref 3.87–5.11)
RDW: 14.6 % (ref 11.5–15.5)
WBC: 2.2 10*3/uL — ABNORMAL LOW (ref 4.0–10.5)
nRBC: 0 % (ref 0.0–0.2)

## 2019-05-20 MED ORDER — FILGRASTIM-SNDZ 480 MCG/0.8ML IJ SOSY
480.0000 ug | PREFILLED_SYRINGE | Freq: Once | INTRAMUSCULAR | Status: AC
  Administered 2019-05-20: 480 ug via SUBCUTANEOUS
  Filled 2019-05-20: qty 0.8

## 2019-05-27 ENCOUNTER — Other Ambulatory Visit: Payer: Self-pay

## 2019-05-27 ENCOUNTER — Inpatient Hospital Stay: Payer: Medicare Other

## 2019-05-27 DIAGNOSIS — D708 Other neutropenia: Secondary | ICD-10-CM | POA: Diagnosis not present

## 2019-05-27 LAB — CBC WITH DIFFERENTIAL/PLATELET
Abs Immature Granulocytes: 0.1 10*3/uL — ABNORMAL HIGH (ref 0.00–0.07)
Band Neutrophils: 8 %
Basophils Absolute: 0 10*3/uL (ref 0.0–0.1)
Basophils Relative: 0 %
Eosinophils Absolute: 0 10*3/uL (ref 0.0–0.5)
Eosinophils Relative: 0 %
HCT: 46.9 % — ABNORMAL HIGH (ref 36.0–46.0)
Hemoglobin: 14.1 g/dL (ref 12.0–15.0)
Lymphocytes Relative: 12 %
Lymphs Abs: 0.5 10*3/uL — ABNORMAL LOW (ref 0.7–4.0)
MCH: 28.4 pg (ref 26.0–34.0)
MCHC: 30.1 g/dL (ref 30.0–36.0)
MCV: 94.6 fL (ref 80.0–100.0)
Metamyelocytes Relative: 3 %
Monocytes Absolute: 0.2 10*3/uL (ref 0.1–1.0)
Monocytes Relative: 6 %
Neutro Abs: 3.1 10*3/uL (ref 1.7–7.7)
Neutrophils Relative %: 71 %
Platelets: 206 10*3/uL (ref 150–400)
RBC: 4.96 MIL/uL (ref 3.87–5.11)
RDW: 14.6 % (ref 11.5–15.5)
Smear Review: NORMAL
WBC: 3.9 10*3/uL — ABNORMAL LOW (ref 4.0–10.5)
nRBC: 0 % (ref 0.0–0.2)

## 2019-06-02 ENCOUNTER — Other Ambulatory Visit: Payer: Self-pay

## 2019-06-03 ENCOUNTER — Inpatient Hospital Stay: Payer: Medicare Other | Attending: Internal Medicine

## 2019-06-03 ENCOUNTER — Other Ambulatory Visit: Payer: Self-pay

## 2019-06-03 VITALS — BP 157/63 | HR 62 | Temp 98.0°F

## 2019-06-03 DIAGNOSIS — Z853 Personal history of malignant neoplasm of breast: Secondary | ICD-10-CM | POA: Insufficient documentation

## 2019-06-03 DIAGNOSIS — R21 Rash and other nonspecific skin eruption: Secondary | ICD-10-CM | POA: Diagnosis not present

## 2019-06-03 DIAGNOSIS — Z87891 Personal history of nicotine dependence: Secondary | ICD-10-CM | POA: Insufficient documentation

## 2019-06-03 DIAGNOSIS — M7989 Other specified soft tissue disorders: Secondary | ICD-10-CM | POA: Diagnosis not present

## 2019-06-03 DIAGNOSIS — Z800 Family history of malignant neoplasm of digestive organs: Secondary | ICD-10-CM | POA: Insufficient documentation

## 2019-06-03 DIAGNOSIS — Z79899 Other long term (current) drug therapy: Secondary | ICD-10-CM | POA: Diagnosis not present

## 2019-06-03 DIAGNOSIS — Z885 Allergy status to narcotic agent status: Secondary | ICD-10-CM | POA: Insufficient documentation

## 2019-06-03 DIAGNOSIS — Z85828 Personal history of other malignant neoplasm of skin: Secondary | ICD-10-CM | POA: Insufficient documentation

## 2019-06-03 DIAGNOSIS — G629 Polyneuropathy, unspecified: Secondary | ICD-10-CM | POA: Insufficient documentation

## 2019-06-03 DIAGNOSIS — M2550 Pain in unspecified joint: Secondary | ICD-10-CM | POA: Insufficient documentation

## 2019-06-03 DIAGNOSIS — Z811 Family history of alcohol abuse and dependence: Secondary | ICD-10-CM | POA: Insufficient documentation

## 2019-06-03 DIAGNOSIS — D72819 Decreased white blood cell count, unspecified: Secondary | ICD-10-CM

## 2019-06-03 DIAGNOSIS — D708 Other neutropenia: Secondary | ICD-10-CM

## 2019-06-03 DIAGNOSIS — Z9013 Acquired absence of bilateral breasts and nipples: Secondary | ICD-10-CM | POA: Diagnosis not present

## 2019-06-03 DIAGNOSIS — Z87442 Personal history of urinary calculi: Secondary | ICD-10-CM | POA: Insufficient documentation

## 2019-06-03 DIAGNOSIS — Z8719 Personal history of other diseases of the digestive system: Secondary | ICD-10-CM | POA: Diagnosis not present

## 2019-06-03 LAB — CBC WITH DIFFERENTIAL/PLATELET
Abs Immature Granulocytes: 0.1 10*3/uL — ABNORMAL HIGH (ref 0.00–0.07)
Basophils Absolute: 0 10*3/uL (ref 0.0–0.1)
Basophils Relative: 0 %
Eosinophils Absolute: 0 10*3/uL (ref 0.0–0.5)
Eosinophils Relative: 1 %
HCT: 43.6 % (ref 36.0–46.0)
Hemoglobin: 13.5 g/dL (ref 12.0–15.0)
Immature Granulocytes: 4 %
Lymphocytes Relative: 31 %
Lymphs Abs: 0.7 10*3/uL (ref 0.7–4.0)
MCH: 29.3 pg (ref 26.0–34.0)
MCHC: 31 g/dL (ref 30.0–36.0)
MCV: 94.8 fL (ref 80.0–100.0)
Monocytes Absolute: 0.3 10*3/uL (ref 0.1–1.0)
Monocytes Relative: 13 %
Neutro Abs: 1.2 10*3/uL — ABNORMAL LOW (ref 1.7–7.7)
Neutrophils Relative %: 51 %
Platelets: 215 10*3/uL (ref 150–400)
RBC: 4.6 MIL/uL (ref 3.87–5.11)
RDW: 14.6 % (ref 11.5–15.5)
WBC: 2.4 10*3/uL — ABNORMAL LOW (ref 4.0–10.5)
nRBC: 0 % (ref 0.0–0.2)

## 2019-06-03 MED ORDER — FILGRASTIM-SNDZ 480 MCG/0.8ML IJ SOSY
480.0000 ug | PREFILLED_SYRINGE | Freq: Once | INTRAMUSCULAR | Status: AC
  Administered 2019-06-03: 480 ug via SUBCUTANEOUS
  Filled 2019-06-03: qty 0.8

## 2019-06-09 ENCOUNTER — Other Ambulatory Visit: Payer: Self-pay

## 2019-06-10 ENCOUNTER — Inpatient Hospital Stay: Payer: Medicare Other

## 2019-06-10 ENCOUNTER — Other Ambulatory Visit: Payer: Self-pay

## 2019-06-10 DIAGNOSIS — D708 Other neutropenia: Secondary | ICD-10-CM | POA: Diagnosis not present

## 2019-06-10 LAB — CBC WITH DIFFERENTIAL/PLATELET
Abs Immature Granulocytes: 0 10*3/uL (ref 0.00–0.07)
Band Neutrophils: 5 %
Basophils Absolute: 0 10*3/uL (ref 0.0–0.1)
Basophils Relative: 0 %
Eosinophils Absolute: 0.2 10*3/uL (ref 0.0–0.5)
Eosinophils Relative: 5 %
HCT: 45.4 % (ref 36.0–46.0)
Hemoglobin: 14 g/dL (ref 12.0–15.0)
Lymphocytes Relative: 36 %
Lymphs Abs: 1.2 10*3/uL (ref 0.7–4.0)
MCH: 28.9 pg (ref 26.0–34.0)
MCHC: 30.8 g/dL (ref 30.0–36.0)
MCV: 93.8 fL (ref 80.0–100.0)
Metamyelocytes Relative: 1 %
Monocytes Absolute: 0 10*3/uL — ABNORMAL LOW (ref 0.1–1.0)
Monocytes Relative: 0 %
Neutro Abs: 2 10*3/uL (ref 1.7–7.7)
Neutrophils Relative %: 53 %
Platelets: 221 10*3/uL (ref 150–400)
RBC: 4.84 MIL/uL (ref 3.87–5.11)
RDW: 14.4 % (ref 11.5–15.5)
Smear Review: ADEQUATE
WBC: 3.4 10*3/uL — ABNORMAL LOW (ref 4.0–10.5)
nRBC: 0 % (ref 0.0–0.2)

## 2019-06-17 ENCOUNTER — Other Ambulatory Visit: Payer: Self-pay

## 2019-06-17 ENCOUNTER — Inpatient Hospital Stay: Payer: Medicare Other

## 2019-06-17 DIAGNOSIS — D708 Other neutropenia: Secondary | ICD-10-CM | POA: Diagnosis not present

## 2019-06-17 LAB — CBC WITH DIFFERENTIAL/PLATELET
Abs Immature Granulocytes: 0.08 10*3/uL — ABNORMAL HIGH (ref 0.00–0.07)
Basophils Absolute: 0 10*3/uL (ref 0.0–0.1)
Basophils Relative: 0 %
Eosinophils Absolute: 0 10*3/uL (ref 0.0–0.5)
Eosinophils Relative: 0 %
HCT: 44.3 % (ref 36.0–46.0)
Hemoglobin: 13.4 g/dL (ref 12.0–15.0)
Immature Granulocytes: 3 %
Lymphocytes Relative: 29 %
Lymphs Abs: 0.7 10*3/uL (ref 0.7–4.0)
MCH: 28.7 pg (ref 26.0–34.0)
MCHC: 30.2 g/dL (ref 30.0–36.0)
MCV: 94.9 fL (ref 80.0–100.0)
Monocytes Absolute: 0.2 10*3/uL (ref 0.1–1.0)
Monocytes Relative: 9 %
Neutro Abs: 1.5 10*3/uL — ABNORMAL LOW (ref 1.7–7.7)
Neutrophils Relative %: 59 %
Platelets: 211 10*3/uL (ref 150–400)
RBC: 4.67 MIL/uL (ref 3.87–5.11)
RDW: 14.3 % (ref 11.5–15.5)
WBC: 2.5 10*3/uL — ABNORMAL LOW (ref 4.0–10.5)
nRBC: 0 % (ref 0.0–0.2)

## 2019-06-23 ENCOUNTER — Other Ambulatory Visit: Payer: Self-pay

## 2019-06-23 NOTE — Progress Notes (Signed)
Patient pre screened for office appointment, no questions or concerns today. Patient reminded of upcoming appointment time and date. 

## 2019-06-24 ENCOUNTER — Inpatient Hospital Stay (HOSPITAL_BASED_OUTPATIENT_CLINIC_OR_DEPARTMENT_OTHER): Payer: Medicare Other | Admitting: Internal Medicine

## 2019-06-24 ENCOUNTER — Inpatient Hospital Stay: Payer: Medicare Other

## 2019-06-24 ENCOUNTER — Other Ambulatory Visit: Payer: Self-pay

## 2019-06-24 DIAGNOSIS — D708 Other neutropenia: Secondary | ICD-10-CM

## 2019-06-24 DIAGNOSIS — D72819 Decreased white blood cell count, unspecified: Secondary | ICD-10-CM

## 2019-06-24 LAB — CBC WITH DIFFERENTIAL/PLATELET
Abs Immature Granulocytes: 0 10*3/uL (ref 0.00–0.07)
Basophils Absolute: 0 10*3/uL (ref 0.0–0.1)
Basophils Relative: 1 %
Eosinophils Absolute: 0 10*3/uL (ref 0.0–0.5)
Eosinophils Relative: 2 %
HCT: 43.4 % (ref 36.0–46.0)
Hemoglobin: 13.2 g/dL (ref 12.0–15.0)
Immature Granulocytes: 0 %
Lymphocytes Relative: 46 %
Lymphs Abs: 0.9 10*3/uL (ref 0.7–4.0)
MCH: 28.8 pg (ref 26.0–34.0)
MCHC: 30.4 g/dL (ref 30.0–36.0)
MCV: 94.8 fL (ref 80.0–100.0)
Monocytes Absolute: 0.2 10*3/uL (ref 0.1–1.0)
Monocytes Relative: 11 %
Neutro Abs: 0.8 10*3/uL — ABNORMAL LOW (ref 1.7–7.7)
Neutrophils Relative %: 40 %
Platelets: 219 10*3/uL (ref 150–400)
RBC: 4.58 MIL/uL (ref 3.87–5.11)
RDW: 14 % (ref 11.5–15.5)
WBC: 1.9 10*3/uL — ABNORMAL LOW (ref 4.0–10.5)
nRBC: 0 % (ref 0.0–0.2)

## 2019-06-24 LAB — BASIC METABOLIC PANEL
Anion gap: 10 (ref 5–15)
BUN: 16 mg/dL (ref 8–23)
CO2: 26 mmol/L (ref 22–32)
Calcium: 9.3 mg/dL (ref 8.9–10.3)
Chloride: 105 mmol/L (ref 98–111)
Creatinine, Ser: 0.9 mg/dL (ref 0.44–1.00)
GFR calc Af Amer: >60 mL/min (ref >60)
GFR calc non Af Amer: 60 mL/min — ABNORMAL LOW (ref >60)
Glucose, Bld: 113 mg/dL — ABNORMAL HIGH (ref 70–99)
Potassium: 3.9 mmol/L (ref 3.5–5.1)
Sodium: 141 mmol/L (ref 135–145)

## 2019-06-24 MED ORDER — FILGRASTIM-SNDZ 480 MCG/0.8ML IJ SOSY
480.0000 ug | PREFILLED_SYRINGE | Freq: Once | INTRAMUSCULAR | Status: AC
  Administered 2019-06-24: 480 ug via SUBCUTANEOUS
  Filled 2019-06-24: qty 0.8

## 2019-06-24 MED ORDER — ZARXIO 480 MCG/0.8ML IJ SOSY: 480.0000 ug | PREFILLED_SYRINGE | INTRAMUSCULAR | 6 refills | Status: DC

## 2019-06-24 NOTE — Progress Notes (Signed)
[facility] NOTE  Patient Care Team: Baxter Hire, MD as PCP - General (Internal Medicine) Emmaline Kluver., MD (Rheumatology) Dasher, Rayvon Char, MD (Dermatology)  CHIEF COMPLAINTS/PURPOSE OF CONSULTATION: Autoimmune neutropenia  #Autoimmune neutropenia-weekly Granix/CBC [Dr.Crocoran]; BMBx- no malignancy [Bone marrow aspirate and biopsy on 09/04/2017 revealed a hypercellular marrow for age with trilineage hematopoiesis.  There was abundant mature neutrophils.  Significant dyspoiesis or increased blasts was not identified.  Flow cytometry revealed a predominance of T lymphocytes (16% of all cells) with no abnormal phenotype.  There was a minor B-cell population (13% of lymphocytes) with slight kappa light chain excess.  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 Oncology History   No history exists.    HISTORY OF PRESENTING ILLNESS:  [name] 85 y.o.  female above history of autoimmune neutropenia is here for follow-up.  Patient has not had any Covid related illnesses.  Patient has not needed any antibiotics.  Has not needed any hospitalizations.  However she was concerned about her low white count.  Also complains of joint pains especially hands.  Also complains of easy bruising.  Rash on her bilateral lower extremities.  Complains of worsening tingling and numbness extremities.  Complains of swelling in the legs.   Review of Systems  Constitutional: Negative for chills, diaphoresis, fever, malaise/fatigue and weight loss.  HENT: Negative for nosebleeds and sore throat.   Eyes: Negative for double vision.  Respiratory: Negative for cough, hemoptysis, sputum production, shortness of breath and wheezing.   Cardiovascular: Positive for leg swelling. Negative for chest pain, palpitations and orthopnea.  Gastrointestinal: Negative  for blood in stool, constipation, diarrhea, heartburn, melena, nausea and vomiting.       Around parastomal hernia.  Genitourinary: Negative for dysuria, frequency and urgency.  Musculoskeletal: Negative for back pain and joint pain.  Skin: Positive for rash. Negative for itching.  Neurological: Positive for tingling. Negative for dizziness, focal weakness, weakness and headaches.  Endo/Heme/Allergies: Bruises/bleeds easily.  Psychiatric/Behavioral: Negative for depression. The patient is nervous/anxious. The patient does not have insomnia.      MEDICAL HISTORY:  Past Medical History:  Diagnosis Date  . Breast cancer ([place]) 1985   bilateral mastectomies  . Depression    1 time specific situation that she did not know how to deal wiht  . Diverticulosis   . Hiatal hernia   . History of Bell's palsy   . History of colon polyps   . History of nephrolithiasis   . History of pancreatitis   . Hyperlipidemia   . IBS (irritable bowel syndrome)   . Osteoporosis    osteopenia in neck  . Skin cancer   . Temporal arteritis ([place])     SURGICAL HISTORY: Past Surgical History:  Procedure Laterality Date  . AUGMENTATION MAMMAPLASTY Bilateral 2000  . COLON RESECTION SIGMOID N/A 10/13/2017   Procedure: COLON RESECTION SIGMOID;  Surgeon: Herbert Pun, MD;  Location: [facility] ORS;  Service: General;  Laterality: N/A;  . COLOSTOMY N/A 10/13/2017   Procedure: COLOSTOMY;  Surgeon: Herbert Pun, MD;  Location: [facility] ORS;  Service: General;  Laterality: N/A;  . LAPAROSCOPIC CHOLECYSTECTOMY  2009  . MASTECTOMY  1983   bilateral    SOCIAL HISTORY: Social History   Socioeconomic History  . Marital status: Widowed    Spouse name: Not on file  . Number of children: 2  . Years of  education: college  . Highest education level: Not on file  Occupational History  . Occupation: Engineer, production  . Occupation: Retired  Tobacco Use  . Smoking status: Former Smoker    Packs/day: 0.25     Years: 30.00    Pack years: 7.50    Types: Cigarettes    Quit date: 05/17/2005    Years since quitting: 14.1  . Smokeless tobacco: Never Used  . Tobacco comment: quit 11 years ago  Substance and Sexual Activity  . Alcohol use: No    Alcohol/week: 0.0 standard drinks  . Drug use: No  . Sexual activity: Not Currently  Other Topics Concern  . Not on file  Social History Narrative   Patient lives at home alone.    Patient is retired.    Patient has some college.    Patient has 2 children.   Social Determinants of Health   Financial Resource Strain:   . Difficulty of Paying Living Expenses: Not on file  Food Insecurity:   . Worried About Charity fundraiser in the Last Year: Not on file  . Ran Out of Food in the Last Year: Not on file  Transportation Needs:   . Lack of Transportation (Medical): Not on file  . Lack of Transportation (Non-Medical): Not on file  Physical Activity:   . Days of Exercise per Week: Not on file  . Minutes of Exercise per Session: Not on file  Stress:   . Feeling of Stress : Not on file  Social Connections:   . Frequency of Communication with Friends and Family: Not on file  . Frequency of Social Gatherings with Friends and Family: Not on file  . Attends Religious Services: Not on file  . Active Member of Clubs or Organizations: Not on file  . Attends Archivist Meetings: Not on file  . Marital Status: Not on file  Intimate Partner Violence:   . Fear of Current or Ex-Partner: Not on file  . Emotionally Abused: Not on file  . Physically Abused: Not on file  . Sexually Abused: Not on file    FAMILY HISTORY: Family History  Problem Relation Age of Onset  . Colon cancer Mother 97  . Scleroderma Father 66  . Alcoholism Brother   . Stomach cancer Neg Hx     ALLERGIES:  is allergic to morphine; benzalkonium chloride; escitalopram; hydralazine; neomycin-bacitracin zn-polymyx; and bacitracin-neomycin-polymyxin.  MEDICATIONS:  Current  Outpatient Medications  Medication Sig Dispense Refill  . ALPRAZolam (XANAX) 0.25 MG tablet Take 0.25 mg by mouth 2 (two) times daily as needed.     . Ascorbic Acid (VITAMIN C) 1000 MG tablet Take 1,000 mg by mouth daily.    Marland Kitchen aspirin EC 81 MG tablet Take 81 mg daily by mouth.    . Biotin 1000 MCG tablet Take 1,000 mcg by mouth daily.     . Cholecalciferol (VITAMIN D3) 2000 units capsule Take 2,000 Units daily by mouth.     . Cyanocobalamin (B-12 PO) Take 2,500 mcg daily by mouth.     . fluorouracil (EFUDEX) 5 % cream Apply 1 application topically 2 (two) times daily.    . Multiple Vitamins-Minerals (ZINC PO) Take by mouth.    . predniSONE (DELTASONE) 5 MG tablet Take 2.5 mg by mouth daily.     . traMADol (ULTRAM) 50 MG tablet Take 50 mg by mouth daily.     Karle Barr (ZARXIO) 480 MCG/0.8ML SOSY injection Inject 0.8 mLs (480 mcg total) into the  skin once a week. 3.2 mL 6   No current facility-administered medications for this visit.    PHYSICAL EXAMINATION: ECOG PERFORMANCE STATUS: 0 - Asymptomatic  Vitals:   06/24/19 1030  BP: (!) 168/62  Pulse: 87  Resp: 20  Temp: 98.1 F (36.7 C)   There were no vitals filed for this visit.  Physical Exam  Constitutional: She is oriented to person, place, and time and well-developed, well-nourished, and in no distress.  Alone.  HENT:  Head: Normocephalic and atraumatic.  Mouth/Throat: Oropharynx is clear and moist. No oropharyngeal exudate.  Eyes: Pupils are equal, round, and reactive to light.  Cardiovascular: Normal rate and regular rhythm.  Pulmonary/Chest: No respiratory distress. She has no wheezes.  Abdominal: Soft. Bowel sounds are normal. She exhibits no distension and no mass. There is no abdominal tenderness. There is no rebound and no guarding.  Colostomy.  Parastomal hernia.  Musculoskeletal:        General: No tenderness or edema. Normal range of motion.     Cervical back: Normal range of motion and neck supple.   Neurological: She is alert and oriented to person, place, and time.  Skin: Skin is warm.  Psychiatric: Affect normal.  Anxious.     LABORATORY DATA:  I have reviewed the data as listed Lab Results  Component Value Date   WBC 1.9 (L) 06/24/2019   HGB 13.2 06/24/2019   HCT 43.4 06/24/2019   MCV 94.8 06/24/2019   PLT 219 06/24/2019   Recent Labs    06/25/18 0943 06/25/18 0943 01/07/19 1016 04/01/19 0942 06/24/19 1006  NA 142   < > 140 140 141  K 4.6   < > 4.5 4.5 3.9  CL 104   < > 103 103 105  CO2 27   < > 27 27 26   GLUCOSE 110*   < > 105* 89 113*  BUN 15   < > 18 19 16   CREATININE 0.92   < > 1.19* 0.89 0.90  CALCIUM 9.4   < > 8.9 9.2 9.3  GFRNONAA 58*   < > 42* >60 60*  GFRAA >60   < > 49* >60 >60  PROT 7.9  --   --   --   --   ALBUMIN 4.3  --   --   --   --   AST 28  --   --   --   --   ALT 18  --   --   --   --   ALKPHOS 118  --   --   --   --   BILITOT 1.0  --   --   --   --    < > = values in this interval not displayed.    RADIOGRAPHIC STUDIES: I have personally reviewed the radiological images as listed and agreed with the findings in the report. No results found.  ASSESSMENT & PLAN:   Autoimmune neutropenia (HCC) # Autoimmune neutropenia- once a week- if ANC < 1.5- zaxio-patient's ANC today-0.8. continue weekly zarxio.  Clinically asymptomatic. On prednisone 2.5mg /day.  Proceed with injection today.  #Also discussed option of outpatient growth factor injection/blood work on a monthly basis.  Patient interested-we will check into the insurance aspect.  For now continue the current plan of care.  # Hx of temporal arteritis->20 years on low dose 2.5 mg/day prednisone-followed by rheumatology.STABLE defer to rheumatology.  # Body/joint pain-worse; -from zarxio recommend claritin/tylenol prn. Worse; recommend reaching out to Dr.K  #  Peripheral neuropathy-discussed regarding referral to neurology.  Will defer to PCP/rheumatology.  # I discussed regarding  Covid-19 precautions.  I reviewed the vaccine effectiveness and potential side effects in detail.  Also discussed long-term effectiveness and safety profile are unclear at this time.  I discussed December, 2020 ASCO position statement-that all patients are recommended COVID-19 vaccinations when available-as long as they do not have allergy to components of the vaccine.  However, I think the benefits of the vaccination outweigh the potential risks. Re: [id number] vaccination.  For more information/scheduling recommend call [place] [id number], 8:30am-4:30pm.    # DISPOSITION: # zaxio today; # weekly cbc/zaxio x12.  # follow up with MD in 12 weeks- cbc/bmp-zaxio-Dr.B  All questions were answered. The patient knows to call the clinic with any problems, questions or concerns.    Cammie Sickle, MD 06/24/2019 2:09 PM

## 2019-06-24 NOTE — Patient Instructions (Signed)
Re: COVID-19 vaccination.  For more information/scheduling recommend call [place] health department- [phone], 8:30am-4:30pm.   

## 2019-06-24 NOTE — Assessment & Plan Note (Addendum)
#   Autoimmune neutropenia- once a week- if ANC < 1.5- zaxio-patient's ANC today-0.8. continue weekly zarxio.  Clinically asymptomatic. On prednisone 2.5mg /day.  Proceed with injection today.  #Also discussed option of outpatient growth factor injection/blood work on a monthly basis.  Patient interested-we will check into the insurance aspect.  For now continue the current plan of care.  # Hx of temporal arteritis->20 years on low dose 2.5 mg/day prednisone-followed by rheumatology.STABLE defer to rheumatology.  # Body/joint pain-worse; -from zarxio recommend claritin/tylenol prn. Worse; recommend reaching out to Dr.K  # Peripheral neuropathy-discussed regarding referral to neurology.  Will defer to PCP/rheumatology.  # I discussed regarding Covid-19 precautions.  I reviewed the vaccine effectiveness and potential side effects in detail.  Also discussed long-term effectiveness and safety profile are unclear at this time.  I discussed December, 2020 ASCO position statement-that all patients are recommended COVID-19 vaccinations when available-as long as they do not have allergy to components of the vaccine.  However, I think the benefits of the vaccination outweigh the potential risks. Re: [id number] vaccination.  For more information/scheduling recommend call [place] [phone], 8:30am-4:30pm.    # DISPOSITION: # zaxio today; # weekly cbc/zaxio x12.  # follow up with MD in 12 weeks- cbc/bmp-zaxio-Dr.B

## 2019-06-25 ENCOUNTER — Telehealth: Payer: Self-pay | Admitting: Pharmacy Technician

## 2019-06-25 ENCOUNTER — Telehealth: Payer: Self-pay | Admitting: Pharmacist

## 2019-06-25 DIAGNOSIS — D708 Other neutropenia: Secondary | ICD-10-CM

## 2019-06-25 MED ORDER — ZARXIO 480 MCG/0.8ML IJ SOSY: 480.0000 ug | PREFILLED_SYRINGE | INTRAMUSCULAR | 6 refills | Status: DC

## 2019-06-25 NOTE — Telephone Encounter (Signed)
Oral Oncology Patient Advocate Encounter  Received notification from [facility] that prior authorization for Zarxio is required.  PA submitted on CoverMyMeds Key [id number] Status is pending  Oral Oncology Clinic will continue to follow.  [place] Patient [place] Phone [phone] Fax [phone] 06/25/2019 11:34 AM

## 2019-06-25 NOTE — Telephone Encounter (Signed)
Oral Oncology Pharmacist Encounter  Received new prescription for Zarxio (filgrastim-sndz) for the treatment of autoimmune neutropenia, planned duration until disease progression or unacceptable toxicity.  Prescription dose and frequency assessed and appropriate for therapy continuation. Patient's most recent injection was received on 06/24/19.  CBC with Diff and BMP from 06/24/19 assessed:  WBC 1.9K (ANC 800) - OK for treatment continuation of therapy  Current medication list in Epic reviewed, no DDIs with Zarxio (filgrastim-sndz) identified.  Prescription has been e-scribed to the [facility] for benefits analysis and approval.  Oral Oncology Clinic will continue to follow for insurance authorization, copayment issues, initial counseling and start date.  Leron Croak, PharmD, [place] PGY2 Hematology/Oncology Pharmacy Resident 06/25/2019 9:46 AM Oral Oncology Clinic [phone]

## 2019-06-25 NOTE — Telephone Encounter (Signed)
Oral Oncology Patient Advocate Encounter  Prior Authorization for [name] has been approved.    PA# [id number] Effective dates: 06/25/2019 through 06/24/2020  Patients co-pay is $747.18  Oral Oncology Clinic will continue to follow.   [place] Patient [name] Phone [phone] Fax [phone] 06/25/2019 3:40 PM

## 2019-06-30 NOTE — Telephone Encounter (Signed)
Spoke to patient.  She said she is okay coming to the clinic for her injections (to see her "friends").  It is cheaper to come to the office than get the medicine through her pharmacy insurance.  I am putting the patient on a wait-list for grant assistance, so if it opens it may be able to help her with medical out of pocket costs for the medication.   She was very appreciative for all our help.  [place] Patient [place] Phone [phone] Fax [phone] 06/30/2019 4:21 PM

## 2019-07-01 ENCOUNTER — Inpatient Hospital Stay: Payer: Medicare Other | Attending: Internal Medicine

## 2019-07-01 ENCOUNTER — Other Ambulatory Visit: Payer: Self-pay

## 2019-07-01 ENCOUNTER — Inpatient Hospital Stay: Payer: Medicare Other

## 2019-07-01 DIAGNOSIS — Z79899 Other long term (current) drug therapy: Secondary | ICD-10-CM | POA: Insufficient documentation

## 2019-07-01 DIAGNOSIS — Z840 Family history of diseases of the skin and subcutaneous tissue: Secondary | ICD-10-CM | POA: Insufficient documentation

## 2019-07-01 DIAGNOSIS — M2550 Pain in unspecified joint: Secondary | ICD-10-CM | POA: Insufficient documentation

## 2019-07-01 DIAGNOSIS — Z811 Family history of alcohol abuse and dependence: Secondary | ICD-10-CM | POA: Diagnosis not present

## 2019-07-01 DIAGNOSIS — G629 Polyneuropathy, unspecified: Secondary | ICD-10-CM | POA: Diagnosis not present

## 2019-07-01 DIAGNOSIS — D708 Other neutropenia: Secondary | ICD-10-CM

## 2019-07-01 DIAGNOSIS — Z800 Family history of malignant neoplasm of digestive organs: Secondary | ICD-10-CM | POA: Insufficient documentation

## 2019-07-01 DIAGNOSIS — D72819 Decreased white blood cell count, unspecified: Secondary | ICD-10-CM

## 2019-07-01 DIAGNOSIS — M316 Other giant cell arteritis: Secondary | ICD-10-CM | POA: Diagnosis not present

## 2019-07-01 LAB — CBC WITH DIFFERENTIAL/PLATELET
Abs Immature Granulocytes: 0.26 10*3/uL — ABNORMAL HIGH (ref 0.00–0.07)
Basophils Absolute: 0 10*3/uL (ref 0.0–0.1)
Basophils Relative: 0 %
Eosinophils Absolute: 0 10*3/uL (ref 0.0–0.5)
Eosinophils Relative: 1 %
HCT: 44.1 % (ref 36.0–46.0)
Hemoglobin: 13.3 g/dL (ref 12.0–15.0)
Immature Granulocytes: 10 %
Lymphocytes Relative: 27 %
Lymphs Abs: 0.7 10*3/uL (ref 0.7–4.0)
MCH: 28.5 pg (ref 26.0–34.0)
MCHC: 30.2 g/dL (ref 30.0–36.0)
MCV: 94.4 fL (ref 80.0–100.0)
Monocytes Absolute: 0.2 10*3/uL (ref 0.1–1.0)
Monocytes Relative: 8 %
Neutro Abs: 1.4 10*3/uL — ABNORMAL LOW (ref 1.7–7.7)
Neutrophils Relative %: 54 %
Platelets: 210 10*3/uL (ref 150–400)
RBC: 4.67 MIL/uL (ref 3.87–5.11)
RDW: 13.9 % (ref 11.5–15.5)
WBC: 2.7 10*3/uL — ABNORMAL LOW (ref 4.0–10.5)
nRBC: 0 % (ref 0.0–0.2)

## 2019-07-01 MED ORDER — FILGRASTIM-SNDZ 480 MCG/0.8ML IJ SOSY
480.0000 ug | PREFILLED_SYRINGE | Freq: Once | INTRAMUSCULAR | Status: AC
  Administered 2019-07-01: 12:00:00 480 ug via SUBCUTANEOUS
  Filled 2019-07-01: qty 0.8

## 2019-07-04 DIAGNOSIS — D704 Cyclic neutropenia: Secondary | ICD-10-CM | POA: Insufficient documentation

## 2019-07-07 ENCOUNTER — Other Ambulatory Visit: Payer: Self-pay

## 2019-07-08 ENCOUNTER — Other Ambulatory Visit: Payer: Self-pay

## 2019-07-08 ENCOUNTER — Inpatient Hospital Stay: Payer: Medicare Other

## 2019-07-08 DIAGNOSIS — D72819 Decreased white blood cell count, unspecified: Secondary | ICD-10-CM

## 2019-07-08 DIAGNOSIS — D708 Other neutropenia: Secondary | ICD-10-CM | POA: Diagnosis not present

## 2019-07-08 LAB — CBC WITH DIFFERENTIAL/PLATELET
Abs Immature Granulocytes: 0.02 10*3/uL (ref 0.00–0.07)
Basophils Absolute: 0 10*3/uL (ref 0.0–0.1)
Basophils Relative: 1 %
Eosinophils Absolute: 0 10*3/uL (ref 0.0–0.5)
Eosinophils Relative: 1 %
HCT: 43.9 % (ref 36.0–46.0)
Hemoglobin: 13.2 g/dL (ref 12.0–15.0)
Immature Granulocytes: 1 %
Lymphocytes Relative: 38 %
Lymphs Abs: 0.7 10*3/uL (ref 0.7–4.0)
MCH: 28.2 pg (ref 26.0–34.0)
MCHC: 30.1 g/dL (ref 30.0–36.0)
MCV: 93.8 fL (ref 80.0–100.0)
Monocytes Absolute: 0.3 10*3/uL (ref 0.1–1.0)
Monocytes Relative: 14 %
Neutro Abs: 0.9 10*3/uL — ABNORMAL LOW (ref 1.7–7.7)
Neutrophils Relative %: 45 %
Platelets: 201 10*3/uL (ref 150–400)
RBC: 4.68 MIL/uL (ref 3.87–5.11)
RDW: 13.9 % (ref 11.5–15.5)
WBC: 1.9 10*3/uL — ABNORMAL LOW (ref 4.0–10.5)
nRBC: 0 % (ref 0.0–0.2)

## 2019-07-08 MED ORDER — FILGRASTIM-SNDZ 480 MCG/0.8ML IJ SOSY
480.0000 ug | PREFILLED_SYRINGE | Freq: Once | INTRAMUSCULAR | Status: AC
  Administered 2019-07-08: 12:00:00 480 ug via SUBCUTANEOUS
  Filled 2019-07-08: qty 0.8

## 2019-07-14 ENCOUNTER — Other Ambulatory Visit: Payer: Self-pay

## 2019-07-15 ENCOUNTER — Inpatient Hospital Stay: Payer: Medicare Other

## 2019-07-15 ENCOUNTER — Other Ambulatory Visit: Payer: Self-pay

## 2019-07-15 DIAGNOSIS — D72819 Decreased white blood cell count, unspecified: Secondary | ICD-10-CM

## 2019-07-15 DIAGNOSIS — D708 Other neutropenia: Secondary | ICD-10-CM

## 2019-07-15 LAB — CBC WITH DIFFERENTIAL/PLATELET
Abs Immature Granulocytes: 0 10*3/uL (ref 0.00–0.07)
Basophils Absolute: 0 10*3/uL (ref 0.0–0.1)
Basophils Relative: 0 %
Eosinophils Absolute: 0 10*3/uL (ref 0.0–0.5)
Eosinophils Relative: 0 %
HCT: 44.9 % (ref 36.0–46.0)
Hemoglobin: 13.7 g/dL (ref 12.0–15.0)
Immature Granulocytes: 0 %
Lymphocytes Relative: 32 %
Lymphs Abs: 0.7 10*3/uL (ref 0.7–4.0)
MCH: 28.4 pg (ref 26.0–34.0)
MCHC: 30.5 g/dL (ref 30.0–36.0)
MCV: 93.2 fL (ref 80.0–100.0)
Monocytes Absolute: 0.2 10*3/uL (ref 0.1–1.0)
Monocytes Relative: 9 %
Neutro Abs: 1.3 10*3/uL — ABNORMAL LOW (ref 1.7–7.7)
Neutrophils Relative %: 59 %
Platelets: 227 10*3/uL (ref 150–400)
RBC: 4.82 MIL/uL (ref 3.87–5.11)
RDW: 14.1 % (ref 11.5–15.5)
WBC: 2.2 10*3/uL — ABNORMAL LOW (ref 4.0–10.5)
nRBC: 0 % (ref 0.0–0.2)

## 2019-07-15 MED ORDER — FILGRASTIM-SNDZ 480 MCG/0.8ML IJ SOSY
480.0000 ug | PREFILLED_SYRINGE | Freq: Once | INTRAMUSCULAR | Status: AC
  Administered 2019-07-15: 480 ug via SUBCUTANEOUS
  Filled 2019-07-15: qty 0.8

## 2019-07-20 ENCOUNTER — Ambulatory Visit: Payer: Medicare Other | Attending: Internal Medicine

## 2019-07-20 DIAGNOSIS — Z23 Encounter for immunization: Secondary | ICD-10-CM

## 2019-07-20 NOTE — Progress Notes (Signed)
   Covid-19 Vaccination Clinic  Name:  [name]    MRN: [id number] DOB: September 05, 1934  07/20/2019  [name] was observed post Covid-19 immunization for 15 minutes without incidence. She was provided with Vaccine Information Sheet and instruction to access the V-Safe system.   [name] was instructed to call 911 with any severe reactions post vaccine: Marland Kitchen Difficulty breathing  . Swelling of your face and throat  . A fast heartbeat  . A bad rash all over your body  . Dizziness and weakness    Immunizations Administered    Name Date Dose VIS Date Route   Pfizer COVID-19 Vaccine 07/20/2019  1:41 PM 0.3 mL 05/09/2019 Intramuscular   Manufacturer: [place]   Lot: [id number]   [place]: [id number]

## 2019-07-21 ENCOUNTER — Other Ambulatory Visit: Payer: Self-pay

## 2019-07-22 ENCOUNTER — Inpatient Hospital Stay: Payer: Medicare Other

## 2019-07-22 ENCOUNTER — Other Ambulatory Visit: Payer: Self-pay

## 2019-07-22 DIAGNOSIS — D708 Other neutropenia: Secondary | ICD-10-CM

## 2019-07-22 LAB — CBC WITH DIFFERENTIAL/PLATELET
Abs Immature Granulocytes: 0.13 10*3/uL — ABNORMAL HIGH (ref 0.00–0.07)
Basophils Absolute: 0 10*3/uL (ref 0.0–0.1)
Basophils Relative: 0 %
Eosinophils Absolute: 0 10*3/uL (ref 0.0–0.5)
Eosinophils Relative: 1 %
HCT: 45.4 % (ref 36.0–46.0)
Hemoglobin: 13.9 g/dL (ref 12.0–15.0)
Immature Granulocytes: 4 %
Lymphocytes Relative: 29 %
Lymphs Abs: 0.9 10*3/uL (ref 0.7–4.0)
MCH: 28.4 pg (ref 26.0–34.0)
MCHC: 30.6 g/dL (ref 30.0–36.0)
MCV: 92.7 fL (ref 80.0–100.0)
Monocytes Absolute: 0.4 10*3/uL (ref 0.1–1.0)
Monocytes Relative: 11 %
Neutro Abs: 1.7 10*3/uL (ref 1.7–7.7)
Neutrophils Relative %: 55 %
Platelets: 212 10*3/uL (ref 150–400)
RBC: 4.9 MIL/uL (ref 3.87–5.11)
RDW: 14.3 % (ref 11.5–15.5)
WBC: 3.2 10*3/uL — ABNORMAL LOW (ref 4.0–10.5)
nRBC: 0 % (ref 0.0–0.2)

## 2019-07-28 ENCOUNTER — Other Ambulatory Visit: Payer: Self-pay

## 2019-07-29 ENCOUNTER — Inpatient Hospital Stay: Payer: Medicare Other

## 2019-07-29 ENCOUNTER — Inpatient Hospital Stay: Payer: Medicare Other | Attending: Internal Medicine

## 2019-07-29 ENCOUNTER — Other Ambulatory Visit: Payer: Self-pay

## 2019-07-29 DIAGNOSIS — Z811 Family history of alcohol abuse and dependence: Secondary | ICD-10-CM | POA: Insufficient documentation

## 2019-07-29 DIAGNOSIS — D708 Other neutropenia: Secondary | ICD-10-CM | POA: Insufficient documentation

## 2019-07-29 DIAGNOSIS — M316 Other giant cell arteritis: Secondary | ICD-10-CM | POA: Diagnosis not present

## 2019-07-29 DIAGNOSIS — Z87891 Personal history of nicotine dependence: Secondary | ICD-10-CM | POA: Diagnosis not present

## 2019-07-29 DIAGNOSIS — Z840 Family history of diseases of the skin and subcutaneous tissue: Secondary | ICD-10-CM | POA: Insufficient documentation

## 2019-07-29 DIAGNOSIS — Z800 Family history of malignant neoplasm of digestive organs: Secondary | ICD-10-CM | POA: Diagnosis not present

## 2019-07-29 DIAGNOSIS — Z86010 Personal history of colonic polyps: Secondary | ICD-10-CM | POA: Insufficient documentation

## 2019-07-29 DIAGNOSIS — R202 Paresthesia of skin: Secondary | ICD-10-CM | POA: Diagnosis not present

## 2019-07-29 DIAGNOSIS — Z79899 Other long term (current) drug therapy: Secondary | ICD-10-CM | POA: Diagnosis not present

## 2019-07-29 DIAGNOSIS — Z8719 Personal history of other diseases of the digestive system: Secondary | ICD-10-CM | POA: Diagnosis not present

## 2019-07-29 DIAGNOSIS — M7989 Other specified soft tissue disorders: Secondary | ICD-10-CM | POA: Insufficient documentation

## 2019-07-29 DIAGNOSIS — Z8669 Personal history of other diseases of the nervous system and sense organs: Secondary | ICD-10-CM | POA: Insufficient documentation

## 2019-07-29 LAB — CBC WITH DIFFERENTIAL/PLATELET
Abs Immature Granulocytes: 0.06 10*3/uL (ref 0.00–0.07)
Basophils Absolute: 0 10*3/uL (ref 0.0–0.1)
Basophils Relative: 0 %
Eosinophils Absolute: 0 10*3/uL (ref 0.0–0.5)
Eosinophils Relative: 0 %
HCT: 43.4 % (ref 36.0–46.0)
Hemoglobin: 13.2 g/dL (ref 12.0–15.0)
Immature Granulocytes: 2 %
Lymphocytes Relative: 31 %
Lymphs Abs: 0.8 10*3/uL (ref 0.7–4.0)
MCH: 28.6 pg (ref 26.0–34.0)
MCHC: 30.4 g/dL (ref 30.0–36.0)
MCV: 94.1 fL (ref 80.0–100.0)
Monocytes Absolute: 0.2 10*3/uL (ref 0.1–1.0)
Monocytes Relative: 9 %
Neutro Abs: 1.5 10*3/uL — ABNORMAL LOW (ref 1.7–7.7)
Neutrophils Relative %: 58 %
Platelets: 214 10*3/uL (ref 150–400)
RBC: 4.61 MIL/uL (ref 3.87–5.11)
RDW: 14.6 % (ref 11.5–15.5)
WBC: 2.5 10*3/uL — ABNORMAL LOW (ref 4.0–10.5)
nRBC: 0 % (ref 0.0–0.2)

## 2019-07-30 ENCOUNTER — Telehealth: Payer: Self-pay | Admitting: Internal Medicine

## 2019-07-30 ENCOUNTER — Encounter: Payer: Self-pay | Admitting: *Deleted

## 2019-07-30 NOTE — Telephone Encounter (Signed)
On 3/2-I tried to reach the patient regarding her low counts and plan for Mohs surgery on 3/25.  Unable to leave a voicemail  H/T-please check with the patient best phone number to reach. I  will try to reach her in the afternoon today again

## 2019-07-31 NOTE — Telephone Encounter (Signed)
Left vm for patient to return my phone call 07/31/2019 at 1427

## 2019-07-31 NOTE — Telephone Encounter (Signed)
On 3/3-called patient; unable to reach patient/leave voicemail.

## 2019-08-04 NOTE — Telephone Encounter (Signed)
On 3/8-finally able to speak to patient-patient clinically doing well.  Mohs surgery last week.  Keep appointments for labs possible growth factor on 3/9.

## 2019-08-04 NOTE — Telephone Encounter (Signed)
Spoke with patient. She states she already had the Moh's surgery on Friday 3/5. She is doing well.

## 2019-08-05 ENCOUNTER — Other Ambulatory Visit: Payer: Self-pay

## 2019-08-05 ENCOUNTER — Inpatient Hospital Stay: Payer: Medicare Other

## 2019-08-05 DIAGNOSIS — D72819 Decreased white blood cell count, unspecified: Secondary | ICD-10-CM

## 2019-08-05 DIAGNOSIS — D708 Other neutropenia: Secondary | ICD-10-CM

## 2019-08-05 LAB — CBC WITH DIFFERENTIAL/PLATELET
Abs Immature Granulocytes: 0.07 10*3/uL (ref 0.00–0.07)
Basophils Absolute: 0 10*3/uL (ref 0.0–0.1)
Basophils Relative: 1 %
Eosinophils Absolute: 0 10*3/uL (ref 0.0–0.5)
Eosinophils Relative: 1 %
HCT: 45.2 % (ref 36.0–46.0)
Hemoglobin: 13.6 g/dL (ref 12.0–15.0)
Immature Granulocytes: 4 %
Lymphocytes Relative: 37 %
Lymphs Abs: 0.7 10*3/uL (ref 0.7–4.0)
MCH: 28.3 pg (ref 26.0–34.0)
MCHC: 30.1 g/dL (ref 30.0–36.0)
MCV: 94 fL (ref 80.0–100.0)
Monocytes Absolute: 0.2 10*3/uL (ref 0.1–1.0)
Monocytes Relative: 9 %
Neutro Abs: 1 10*3/uL — ABNORMAL LOW (ref 1.7–7.7)
Neutrophils Relative %: 48 %
Platelets: 197 10*3/uL (ref 150–400)
RBC: 4.81 MIL/uL (ref 3.87–5.11)
RDW: 14.6 % (ref 11.5–15.5)
WBC: 2 10*3/uL — ABNORMAL LOW (ref 4.0–10.5)
nRBC: 0 % (ref 0.0–0.2)

## 2019-08-05 MED ORDER — FILGRASTIM-SNDZ 480 MCG/0.8ML IJ SOSY
480.0000 ug | PREFILLED_SYRINGE | Freq: Once | INTRAMUSCULAR | Status: AC
  Administered 2019-08-05: 11:00:00 480 ug via SUBCUTANEOUS
  Filled 2019-08-05: qty 0.8

## 2019-08-12 ENCOUNTER — Inpatient Hospital Stay: Payer: Medicare Other

## 2019-08-12 DIAGNOSIS — D708 Other neutropenia: Secondary | ICD-10-CM

## 2019-08-12 DIAGNOSIS — D72819 Decreased white blood cell count, unspecified: Secondary | ICD-10-CM

## 2019-08-12 LAB — CBC WITH DIFFERENTIAL/PLATELET
Abs Immature Granulocytes: 0 10*3/uL (ref 0.00–0.07)
Basophils Absolute: 0 10*3/uL (ref 0.0–0.1)
Basophils Relative: 0 %
Eosinophils Absolute: 0 10*3/uL (ref 0.0–0.5)
Eosinophils Relative: 2 %
HCT: 45.6 % (ref 36.0–46.0)
Hemoglobin: 14.2 g/dL (ref 12.0–15.0)
Immature Granulocytes: 0 %
Lymphocytes Relative: 33 %
Lymphs Abs: 0.7 10*3/uL (ref 0.7–4.0)
MCH: 28.5 pg (ref 26.0–34.0)
MCHC: 31.1 g/dL (ref 30.0–36.0)
MCV: 91.4 fL (ref 80.0–100.0)
Monocytes Absolute: 0.2 10*3/uL (ref 0.1–1.0)
Monocytes Relative: 10 %
Neutro Abs: 1.2 10*3/uL — ABNORMAL LOW (ref 1.7–7.7)
Neutrophils Relative %: 55 %
Platelets: 186 10*3/uL (ref 150–400)
RBC: 4.99 MIL/uL (ref 3.87–5.11)
RDW: 14.8 % (ref 11.5–15.5)
Smear Review: NORMAL
WBC: 2.2 10*3/uL — ABNORMAL LOW (ref 4.0–10.5)
nRBC: 0 % (ref 0.0–0.2)

## 2019-08-12 MED ORDER — FILGRASTIM-SNDZ 480 MCG/0.8ML IJ SOSY
480.0000 ug | PREFILLED_SYRINGE | Freq: Once | INTRAMUSCULAR | Status: AC
  Administered 2019-08-12: 480 ug via SUBCUTANEOUS
  Filled 2019-08-12: qty 0.8

## 2019-08-13 ENCOUNTER — Ambulatory Visit: Payer: Medicare Other | Attending: Internal Medicine

## 2019-08-13 DIAGNOSIS — Z23 Encounter for immunization: Secondary | ICD-10-CM

## 2019-08-13 NOTE — Progress Notes (Signed)
   Covid-19 Vaccination Clinic  Name:  RAMONA WEINER    MRN: [id number] DOB: Aug 30, 1934  08/13/2019  Ms. Dubuisson was observed post Covid-19 immunization for 15 minutes without incident. She was provided with Vaccine Information Sheet and instruction to access the V-Safe system.   Ms. King was instructed to call 911 with any severe reactions post vaccine: Marland Kitchen Difficulty breathing  . Swelling of face and throat  . A fast heartbeat  . A bad rash all over body  . Dizziness and weakness   Immunizations Administered    Name Date Dose VIS Date Route   Pfizer COVID-19 Vaccine 08/13/2019 10:57 AM 0.3 mL 05/09/2019 Intramuscular   Manufacturer: [place]   Lot: [id number]   [place]: [id number]

## 2019-08-19 ENCOUNTER — Inpatient Hospital Stay: Payer: Medicare Other

## 2019-08-19 DIAGNOSIS — D708 Other neutropenia: Secondary | ICD-10-CM | POA: Diagnosis not present

## 2019-08-19 LAB — CBC WITH DIFFERENTIAL/PLATELET
Abs Immature Granulocytes: 0 10*3/uL (ref 0.00–0.07)
Basophils Absolute: 0 10*3/uL (ref 0.0–0.1)
Basophils Relative: 0 %
Eosinophils Absolute: 0 10*3/uL (ref 0.0–0.5)
Eosinophils Relative: 1 %
HCT: 44.4 % (ref 36.0–46.0)
Hemoglobin: 14 g/dL (ref 12.0–15.0)
Immature Granulocytes: 0 %
Lymphocytes Relative: 29 %
Lymphs Abs: 0.9 10*3/uL (ref 0.7–4.0)
MCH: 28.6 pg (ref 26.0–34.0)
MCHC: 31.5 g/dL (ref 30.0–36.0)
MCV: 90.8 fL (ref 80.0–100.0)
Monocytes Absolute: 0.3 10*3/uL (ref 0.1–1.0)
Monocytes Relative: 9 %
Neutro Abs: 1.8 10*3/uL (ref 1.7–7.7)
Neutrophils Relative %: 61 %
Platelets: 192 10*3/uL (ref 150–400)
RBC: 4.89 MIL/uL (ref 3.87–5.11)
RDW: 14.9 % (ref 11.5–15.5)
WBC: 2.9 10*3/uL — ABNORMAL LOW (ref 4.0–10.5)
nRBC: 0 % (ref 0.0–0.2)

## 2019-08-26 ENCOUNTER — Inpatient Hospital Stay: Payer: Medicare Other

## 2019-08-26 DIAGNOSIS — D708 Other neutropenia: Secondary | ICD-10-CM

## 2019-08-26 DIAGNOSIS — D72819 Decreased white blood cell count, unspecified: Secondary | ICD-10-CM

## 2019-08-26 LAB — CBC WITH DIFFERENTIAL/PLATELET
Abs Immature Granulocytes: 0.02 10*3/uL (ref 0.00–0.07)
Basophils Absolute: 0 10*3/uL (ref 0.0–0.1)
Basophils Relative: 0 %
Eosinophils Absolute: 0 10*3/uL (ref 0.0–0.5)
Eosinophils Relative: 0 %
HCT: 43.6 % (ref 36.0–46.0)
Hemoglobin: 13.9 g/dL (ref 12.0–15.0)
Immature Granulocytes: 1 %
Lymphocytes Relative: 30 %
Lymphs Abs: 0.8 10*3/uL (ref 0.7–4.0)
MCH: 29.1 pg (ref 26.0–34.0)
MCHC: 31.9 g/dL (ref 30.0–36.0)
MCV: 91.4 fL (ref 80.0–100.0)
Monocytes Absolute: 0.3 10*3/uL (ref 0.1–1.0)
Monocytes Relative: 9 %
Neutro Abs: 1.6 10*3/uL — ABNORMAL LOW (ref 1.7–7.7)
Neutrophils Relative %: 60 %
Platelets: 209 10*3/uL (ref 150–400)
RBC: 4.77 MIL/uL (ref 3.87–5.11)
RDW: 14.9 % (ref 11.5–15.5)
WBC: 2.8 10*3/uL — ABNORMAL LOW (ref 4.0–10.5)
nRBC: 0 % (ref 0.0–0.2)

## 2019-08-26 MED ORDER — FILGRASTIM-SNDZ 480 MCG/0.8ML IJ SOSY
480.0000 ug | PREFILLED_SYRINGE | Freq: Once | INTRAMUSCULAR | Status: DC
  Filled 2019-08-26: qty 0.8

## 2019-09-02 ENCOUNTER — Inpatient Hospital Stay: Payer: Medicare Other | Attending: Internal Medicine

## 2019-09-02 ENCOUNTER — Other Ambulatory Visit: Payer: Self-pay

## 2019-09-02 ENCOUNTER — Inpatient Hospital Stay: Payer: Medicare Other

## 2019-09-02 DIAGNOSIS — Z7952 Long term (current) use of systemic steroids: Secondary | ICD-10-CM | POA: Insufficient documentation

## 2019-09-02 DIAGNOSIS — D708 Other neutropenia: Secondary | ICD-10-CM | POA: Insufficient documentation

## 2019-09-02 DIAGNOSIS — Z853 Personal history of malignant neoplasm of breast: Secondary | ICD-10-CM | POA: Insufficient documentation

## 2019-09-02 DIAGNOSIS — Z885 Allergy status to narcotic agent status: Secondary | ICD-10-CM | POA: Insufficient documentation

## 2019-09-02 DIAGNOSIS — Z87891 Personal history of nicotine dependence: Secondary | ICD-10-CM | POA: Insufficient documentation

## 2019-09-02 DIAGNOSIS — Z79899 Other long term (current) drug therapy: Secondary | ICD-10-CM | POA: Diagnosis not present

## 2019-09-02 DIAGNOSIS — R030 Elevated blood-pressure reading, without diagnosis of hypertension: Secondary | ICD-10-CM | POA: Diagnosis not present

## 2019-09-02 DIAGNOSIS — M7989 Other specified soft tissue disorders: Secondary | ICD-10-CM | POA: Insufficient documentation

## 2019-09-02 DIAGNOSIS — Z8269 Family history of other diseases of the musculoskeletal system and connective tissue: Secondary | ICD-10-CM | POA: Diagnosis not present

## 2019-09-02 DIAGNOSIS — Z85828 Personal history of other malignant neoplasm of skin: Secondary | ICD-10-CM | POA: Insufficient documentation

## 2019-09-02 DIAGNOSIS — Z800 Family history of malignant neoplasm of digestive organs: Secondary | ICD-10-CM | POA: Diagnosis not present

## 2019-09-02 DIAGNOSIS — Z9013 Acquired absence of bilateral breasts and nipples: Secondary | ICD-10-CM | POA: Diagnosis not present

## 2019-09-02 DIAGNOSIS — Z811 Family history of alcohol abuse and dependence: Secondary | ICD-10-CM | POA: Insufficient documentation

## 2019-09-02 DIAGNOSIS — Z87442 Personal history of urinary calculi: Secondary | ICD-10-CM | POA: Diagnosis not present

## 2019-09-02 DIAGNOSIS — D72819 Decreased white blood cell count, unspecified: Secondary | ICD-10-CM

## 2019-09-02 DIAGNOSIS — Z8719 Personal history of other diseases of the digestive system: Secondary | ICD-10-CM | POA: Insufficient documentation

## 2019-09-02 DIAGNOSIS — R21 Rash and other nonspecific skin eruption: Secondary | ICD-10-CM | POA: Insufficient documentation

## 2019-09-02 DIAGNOSIS — Z596 Low income: Secondary | ICD-10-CM | POA: Diagnosis not present

## 2019-09-02 DIAGNOSIS — M2550 Pain in unspecified joint: Secondary | ICD-10-CM | POA: Diagnosis not present

## 2019-09-02 LAB — CBC WITH DIFFERENTIAL/PLATELET
Abs Immature Granulocytes: 0.03 10*3/uL (ref 0.00–0.07)
Basophils Absolute: 0 10*3/uL (ref 0.0–0.1)
Basophils Relative: 1 %
Eosinophils Absolute: 0 10*3/uL (ref 0.0–0.5)
Eosinophils Relative: 2 %
HCT: 44 % (ref 36.0–46.0)
Hemoglobin: 13.6 g/dL (ref 12.0–15.0)
Immature Granulocytes: 2 %
Lymphocytes Relative: 49 %
Lymphs Abs: 1 10*3/uL (ref 0.7–4.0)
MCH: 28.9 pg (ref 26.0–34.0)
MCHC: 30.9 g/dL (ref 30.0–36.0)
MCV: 93.4 fL (ref 80.0–100.0)
Monocytes Absolute: 0.3 10*3/uL (ref 0.1–1.0)
Monocytes Relative: 14 %
Neutro Abs: 0.7 10*3/uL — ABNORMAL LOW (ref 1.7–7.7)
Neutrophils Relative %: 32 %
Platelets: 218 10*3/uL (ref 150–400)
RBC: 4.71 MIL/uL (ref 3.87–5.11)
RDW: 15.1 % (ref 11.5–15.5)
WBC: 2 10*3/uL — ABNORMAL LOW (ref 4.0–10.5)
nRBC: 0 % (ref 0.0–0.2)

## 2019-09-02 MED ORDER — FILGRASTIM-SNDZ 480 MCG/0.8ML IJ SOSY
480.0000 ug | PREFILLED_SYRINGE | Freq: Once | INTRAMUSCULAR | Status: AC
  Administered 2019-09-02: 11:00:00 480 ug via SUBCUTANEOUS
  Filled 2019-09-02: qty 0.8

## 2019-09-09 ENCOUNTER — Other Ambulatory Visit: Payer: Self-pay

## 2019-09-09 ENCOUNTER — Inpatient Hospital Stay: Payer: Medicare Other

## 2019-09-09 DIAGNOSIS — D72819 Decreased white blood cell count, unspecified: Secondary | ICD-10-CM

## 2019-09-09 DIAGNOSIS — D708 Other neutropenia: Secondary | ICD-10-CM

## 2019-09-09 LAB — CBC WITH DIFFERENTIAL/PLATELET
Abs Immature Granulocytes: 0.02 10*3/uL (ref 0.00–0.07)
Basophils Absolute: 0 10*3/uL (ref 0.0–0.1)
Basophils Relative: 0 %
Eosinophils Absolute: 0 10*3/uL (ref 0.0–0.5)
Eosinophils Relative: 1 %
HCT: 45.1 % (ref 36.0–46.0)
Hemoglobin: 14.4 g/dL (ref 12.0–15.0)
Immature Granulocytes: 1 %
Lymphocytes Relative: 34 %
Lymphs Abs: 0.8 10*3/uL (ref 0.7–4.0)
MCH: 29 pg (ref 26.0–34.0)
MCHC: 31.9 g/dL (ref 30.0–36.0)
MCV: 90.9 fL (ref 80.0–100.0)
Monocytes Absolute: 0.2 10*3/uL (ref 0.1–1.0)
Monocytes Relative: 7 %
Neutro Abs: 1.3 10*3/uL — ABNORMAL LOW (ref 1.7–7.7)
Neutrophils Relative %: 57 %
Platelets: 173 10*3/uL (ref 150–400)
RBC: 4.96 MIL/uL (ref 3.87–5.11)
RDW: 15.2 % (ref 11.5–15.5)
WBC Morphology: 2
WBC: 2.3 10*3/uL — ABNORMAL LOW (ref 4.0–10.5)
nRBC: 0 % (ref 0.0–0.2)

## 2019-09-09 MED ORDER — FILGRASTIM-SNDZ 480 MCG/0.8ML IJ SOSY
480.0000 ug | PREFILLED_SYRINGE | Freq: Once | INTRAMUSCULAR | Status: AC
  Administered 2019-09-09: 480 ug via SUBCUTANEOUS
  Filled 2019-09-09: qty 0.8

## 2019-09-15 ENCOUNTER — Ambulatory Visit: Payer: Medicare Other | Attending: Rheumatology | Admitting: Occupational Therapy

## 2019-09-16 ENCOUNTER — Encounter: Payer: Self-pay | Admitting: Internal Medicine

## 2019-09-16 ENCOUNTER — Inpatient Hospital Stay: Payer: Medicare Other

## 2019-09-16 ENCOUNTER — Other Ambulatory Visit: Payer: Self-pay

## 2019-09-16 ENCOUNTER — Inpatient Hospital Stay (HOSPITAL_BASED_OUTPATIENT_CLINIC_OR_DEPARTMENT_OTHER): Payer: Medicare Other | Admitting: Internal Medicine

## 2019-09-16 DIAGNOSIS — D708 Other neutropenia: Secondary | ICD-10-CM

## 2019-09-16 DIAGNOSIS — D72819 Decreased white blood cell count, unspecified: Secondary | ICD-10-CM

## 2019-09-16 LAB — CBC WITH DIFFERENTIAL/PLATELET
Abs Immature Granulocytes: 0.03 10*3/uL (ref 0.00–0.07)
Basophils Absolute: 0 10*3/uL (ref 0.0–0.1)
Basophils Relative: 1 %
Eosinophils Absolute: 0 10*3/uL (ref 0.0–0.5)
Eosinophils Relative: 1 %
HCT: 45.7 % (ref 36.0–46.0)
Hemoglobin: 14.2 g/dL (ref 12.0–15.0)
Immature Granulocytes: 2 %
Lymphocytes Relative: 42 %
Lymphs Abs: 0.8 10*3/uL (ref 0.7–4.0)
MCH: 28.6 pg (ref 26.0–34.0)
MCHC: 31.1 g/dL (ref 30.0–36.0)
MCV: 92 fL (ref 80.0–100.0)
Monocytes Absolute: 0.2 10*3/uL (ref 0.1–1.0)
Monocytes Relative: 11 %
Neutro Abs: 0.8 10*3/uL — ABNORMAL LOW (ref 1.7–7.7)
Neutrophils Relative %: 43 %
Platelets: 167 10*3/uL (ref 150–400)
RBC: 4.97 MIL/uL (ref 3.87–5.11)
RDW: 15.3 % (ref 11.5–15.5)
WBC: 1.8 10*3/uL — ABNORMAL LOW (ref 4.0–10.5)
nRBC: 0 % (ref 0.0–0.2)

## 2019-09-16 MED ORDER — FILGRASTIM-SNDZ 480 MCG/0.8ML IJ SOSY
480.0000 ug | PREFILLED_SYRINGE | Freq: Once | INTRAMUSCULAR | Status: AC
  Administered 2019-09-16: 11:00:00 480 ug via SUBCUTANEOUS
  Filled 2019-09-16: qty 0.8

## 2019-09-16 NOTE — Progress Notes (Signed)
Pt in for follow up, concerned about large areas of bruising on right and left lower legs/shins.  States has worsened in last week.

## 2019-09-16 NOTE — Progress Notes (Signed)
[name]  Patient Care Team: Baxter Hire, MD as PCP - General (Internal Medicine) Emmaline Kluver., MD (Rheumatology) Dasher, Rayvon Char, MD (Dermatology) Cammie Sickle, MD as Consulting Physician (Hematology and Oncology)  CHIEF COMPLAINTS/PURPOSE OF CONSULTATION: Autoimmune neutropenia  #Autoimmune neutropenia-weekly Granix/CBC [Dr.Crocoran]; BMBx- no malignancy [Bone marrow aspirate and biopsy on 09/04/2017 revealed a hypercellular marrow for age with trilineage hematopoiesis.  There was abundant mature neutrophils.  Significant dyspoiesis or increased blasts was not identified.  Flow cytometry revealed a predominance of T lymphocytes (16% of all cells) with no abnormal phenotype.  There was a minor B-cell population (13% of lymphocytes) with slight kappa light chain excess.  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 Oncology History   No history exists.    HISTORY OF PRESENTING ILLNESS:  [name] 85 y.o.  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  Patient has not had any infections.  Patient has not had any need for antibiotics.  No hospitalizations.  Patient concerned about rash on her bilateral shins.  Previous eval by dermatology.  Patient complains of joint pains arthritic pain post injections/growth factor support.  Review of Systems  Constitutional: Negative for chills, diaphoresis, fever, malaise/fatigue and weight loss.  HENT: Negative for nosebleeds and sore throat.   Eyes: Negative for double vision.  Respiratory: Negative for cough, hemoptysis, sputum production, shortness of breath and wheezing.   Cardiovascular: Positive for leg swelling. Negative for chest pain, palpitations and orthopnea.  Gastrointestinal: Negative for blood in  stool, constipation, diarrhea, heartburn, melena, nausea and vomiting.       Around parastomal hernia.  Genitourinary: Negative for dysuria, frequency and urgency.  Musculoskeletal: Negative for back pain and joint pain.  Skin: Positive for rash. Negative for itching.  Neurological: Positive for tingling. Negative for dizziness, focal weakness, weakness and headaches.  Endo/Heme/Allergies: Bruises/bleeds easily.  Psychiatric/Behavioral: Negative for depression. The patient is nervous/anxious. The patient does not have insomnia.      MEDICAL HISTORY:  Past Medical History:  Diagnosis Date  . Breast cancer ([place]) 1985   bilateral mastectomies  . Depression    1 time specific situation that she did not know how to deal wiht  . Diverticulosis   . Hiatal hernia   . History of Bell's palsy   . History of colon polyps   . History of nephrolithiasis   . History of pancreatitis   . Hyperlipidemia   . IBS (irritable bowel syndrome)   . Osteoporosis    osteopenia in neck  . Skin cancer   . Temporal arteritis ([place])     SURGICAL HISTORY: Past Surgical History:  Procedure Laterality Date  . AUGMENTATION MAMMAPLASTY Bilateral 2000  . COLON RESECTION SIGMOID N/A 10/13/2017   Procedure: COLON RESECTION SIGMOID;  Surgeon: Herbert Pun, MD;  Location: [facility] ORS;  Service: General;  Laterality: N/A;  . COLOSTOMY N/A 10/13/2017   Procedure: COLOSTOMY;  Surgeon: Herbert Pun, MD;  Location: [facility] ORS;  Service: General;  Laterality: N/A;  . LAPAROSCOPIC CHOLECYSTECTOMY  2009  . MASTECTOMY  1983   bilateral    SOCIAL HISTORY: Social History   Socioeconomic History  . Marital status: Widowed    Spouse name: Not on file  . Number of children: 2  . Years of education: college  . Highest education level: Not on  file  Occupational History  . Occupation: Engineer, production  . Occupation: Retired  Tobacco Use  . Smoking status: Former Smoker    Packs/day: 0.25    Years: 30.00     Pack years: 7.50    Types: Cigarettes    Quit date: 05/17/2005    Years since quitting: 14.3  . Smokeless tobacco: Never Used  . Tobacco comment: quit 11 years ago  Substance and Sexual Activity  . Alcohol use: No    Alcohol/week: 0.0 standard drinks  . Drug use: No  . Sexual activity: Not Currently  Other Topics Concern  . Not on file  Social History Narrative   Patient lives at home alone.    Patient is retired.    Patient has some college.    Patient has 2 children.   Social Determinants of Health   Financial Resource Strain:   . Difficulty of Paying Living Expenses:   Food Insecurity:   . Worried About Charity fundraiser in the Last Year:   . Arboriculturist in the Last Year:   Transportation Needs:   . Film/video editor (Medical):   Marland Kitchen Lack of Transportation (Non-Medical):   Physical Activity:   . Days of Exercise per Week:   . Minutes of Exercise per Session:   Stress:   . Feeling of Stress :   Social Connections:   . Frequency of Communication with Friends and Family:   . Frequency of Social Gatherings with Friends and Family:   . Attends Religious Services:   . Active Member of Clubs or Organizations:   . Attends Archivist Meetings:   Marland Kitchen Marital Status:   Intimate Partner Violence:   . Fear of Current or Ex-Partner:   . Emotionally Abused:   Marland Kitchen Physically Abused:   . Sexually Abused:     FAMILY HISTORY: Family History  Problem Relation Age of Onset  . Colon cancer Mother 65  . Scleroderma Father 66  . Alcoholism Brother   . Stomach cancer Neg Hx     ALLERGIES:  is allergic to morphine; benzalkonium chloride; escitalopram; hydralazine; neomycin-bacitracin zn-polymyx; and bacitracin-neomycin-polymyxin.  MEDICATIONS:  Current Outpatient Medications  Medication Sig Dispense Refill  . ALPRAZolam (XANAX) 0.25 MG tablet Take 0.25 mg by mouth 2 (two) times daily as needed.     . Ascorbic Acid (VITAMIN C) 1000 MG tablet Take 1,000 mg by  mouth daily.    Marland Kitchen aspirin EC 81 MG tablet Take 81 mg daily by mouth.    . Biotin 1000 MCG tablet Take 1,000 mcg by mouth daily.     . Cholecalciferol (VITAMIN D3) 2000 units capsule Take 2,000 Units daily by mouth.     . Cyanocobalamin (B-12 PO) Take 2,500 mcg daily by mouth.     Karle Barr (ZARXIO) 480 MCG/0.8ML SOSY injection Inject 0.8 mLs (480 mcg total) into the skin once a week. Administer injection subcutaneously 3.2 mL 6  . fluorouracil (EFUDEX) 5 % cream Apply 1 application topically 2 (two) times daily.    . Multiple Vitamins-Minerals (ZINC PO) Take by mouth.    . predniSONE (DELTASONE) 5 MG tablet Take 2.5 mg by mouth daily.     . traMADol (ULTRAM) 50 MG tablet Take 50 mg by mouth daily.      No current facility-administered medications for this visit.    PHYSICAL EXAMINATION: ECOG PERFORMANCE STATUS: 0 - Asymptomatic  Vitals:   09/16/19 1035  BP: (!) 191/59  Pulse: 87  Resp:  18  Temp: (!) 97 F (36.1 C)  SpO2: 100%   Filed Weights   09/16/19 1035  Weight: 166 lb 3.2 oz (75.4 kg)    Physical Exam  Constitutional: She is oriented to person, place, and time and well-developed, well-nourished, and in no distress.  Alone.  HENT:  Head: Normocephalic and atraumatic.  Mouth/Throat: Oropharynx is clear and moist. No oropharyngeal exudate.  Eyes: Pupils are equal, round, and reactive to light.  Cardiovascular: Normal rate and regular rhythm.  Pulmonary/Chest: No respiratory distress. She has no wheezes.  Abdominal: Soft. Bowel sounds are normal. She exhibits no distension and no mass. There is no abdominal tenderness. There is no rebound and no guarding.  Colostomy.  Parastomal hernia.  Musculoskeletal:        General: No tenderness or edema. Normal range of motion.     Cervical back: Normal range of motion and neck supple.  Neurological: She is alert and oriented to person, place, and time.  Skin: Skin is warm.  Hyperpigmented rash noted bilateral shins.   Psychiatric: Affect normal.  Anxious.     LABORATORY DATA:  I have reviewed the data as listed Lab Results  Component Value Date   WBC 1.8 (L) 09/16/2019   HGB 14.2 09/16/2019   HCT 45.7 09/16/2019   MCV 92.0 09/16/2019   PLT 167 09/16/2019   Recent Labs    01/07/19 1016 04/01/19 0942 06/24/19 1006  NA 140 140 141  K 4.5 4.5 3.9  CL 103 103 105  CO2 27 27 26   GLUCOSE 105* 89 113*  BUN 18 19 16   CREATININE 1.19* 0.89 0.90  CALCIUM 8.9 9.2 9.3  GFRNONAA 42* >60 60*  GFRAA 49* >60 >60    RADIOGRAPHIC STUDIES: I have personally reviewed the radiological images as listed and agreed with the findings in the report. No results found.  ASSESSMENT & PLAN:   Autoimmune neutropenia (HCC) # Autoimmune neutropenia- once a week- if ANC < 1.5- zaxio-patient's ANC today-0.8. continue weekly zarxio.  Clinically asymptomatic. On prednisone 2.5mg /day.  Proceed with injection today.  #Body aches joint pains worse-question Zarxio vs others.  Discussed holding however patient concerned about risk of infections.  # Hx of temporal arteritis->20 years on low dose 2.5 mg/day prednisone-stable.  #Elevated blood pressure ; no history of 123456 systolic-likely situational/anxiety.  Recommend checking blood pressure at home and if systolic greater than [id number] reading 90 return to PCP.  #Bilateral skin rash on the shins chronic.  Question vascular versus others.  Defer to dermatology/vascular  # DISPOSITION: # zaxio today; # weekly cbc/zaxio x12.  # follow up with MD in 12 weeks- cbc/bmp-zaxio-Dr.B  All questions were answered. The patient knows to call the clinic with any problems, questions or concerns.    Cammie Sickle, MD 09/16/2019 1:48 PM

## 2019-09-16 NOTE — Assessment & Plan Note (Addendum)
#   Autoimmune neutropenia- once a week- if ANC < 1.5- zaxio-patient's ANC today-0.8. continue weekly zarxio.  Clinically asymptomatic. On prednisone 2.5mg /day.  Proceed with injection today.  #Body aches joint pains worse-question Zarxio vs others.  Discussed holding however patient concerned about risk of infections.  Recommend NSAIDs.  # Hx of temporal arteritis->20 years on low dose 2.5 mg/day prednisone-stable.  #Elevated blood pressure ; no history of 123456 systolic-likely situational/anxiety.  Recommend checking blood pressure at home and if systolic greater than [id number] reading 90 return to PCP.  #Bilateral skin rash on the shins chronic.  Question vascular versus others.  Defer to dermatology/vascular  # DISPOSITION: # zaxio today; # weekly cbc/zaxio x12.  # follow up with MD in 12 weeks- cbc/bmp-zaxio-Dr.B

## 2019-09-23 ENCOUNTER — Inpatient Hospital Stay: Payer: Medicare Other

## 2019-09-23 ENCOUNTER — Other Ambulatory Visit: Payer: Self-pay

## 2019-09-23 DIAGNOSIS — D72819 Decreased white blood cell count, unspecified: Secondary | ICD-10-CM

## 2019-09-23 DIAGNOSIS — D708 Other neutropenia: Secondary | ICD-10-CM | POA: Diagnosis not present

## 2019-09-23 LAB — CBC WITH DIFFERENTIAL/PLATELET
Abs Immature Granulocytes: 0 10*3/uL (ref 0.00–0.07)
Basophils Absolute: 0 10*3/uL (ref 0.0–0.1)
Basophils Relative: 0 %
Eosinophils Absolute: 0 10*3/uL (ref 0.0–0.5)
Eosinophils Relative: 0 %
HCT: 46.1 % — ABNORMAL HIGH (ref 36.0–46.0)
Hemoglobin: 14.4 g/dL (ref 12.0–15.0)
Lymphocytes Relative: 50 %
Lymphs Abs: 1 10*3/uL (ref 0.7–4.0)
MCH: 28.6 pg (ref 26.0–34.0)
MCHC: 31.2 g/dL (ref 30.0–36.0)
MCV: 91.7 fL (ref 80.0–100.0)
Monocytes Absolute: 0.1 10*3/uL (ref 0.1–1.0)
Monocytes Relative: 6 %
Neutro Abs: 0.9 10*3/uL — ABNORMAL LOW (ref 1.7–7.7)
Neutrophils Relative %: 44 %
Platelets: 217 10*3/uL (ref 150–400)
RBC: 5.03 MIL/uL (ref 3.87–5.11)
RDW: 15.3 % (ref 11.5–15.5)
Smear Review: ADEQUATE
WBC: 2 10*3/uL — ABNORMAL LOW (ref 4.0–10.5)
nRBC: 0 % (ref 0.0–0.2)

## 2019-09-23 MED ORDER — FILGRASTIM-SNDZ 480 MCG/0.8ML IJ SOSY
480.0000 ug | PREFILLED_SYRINGE | Freq: Once | INTRAMUSCULAR | Status: AC
  Administered 2019-09-23: 480 ug via SUBCUTANEOUS
  Filled 2019-09-23: qty 0.8

## 2019-09-24 ENCOUNTER — Encounter: Payer: Self-pay | Admitting: *Deleted

## 2019-09-24 ENCOUNTER — Emergency Department
Admission: EM | Admit: 2019-09-24 | Discharge: 2019-09-24 | Disposition: A | Discharge status: Home / Self Care | Payer: Medicare Other | Attending: Emergency Medicine | Admitting: Emergency Medicine

## 2019-09-24 ENCOUNTER — Other Ambulatory Visit: Payer: Self-pay

## 2019-09-24 DIAGNOSIS — I10 Essential (primary) hypertension: Secondary | ICD-10-CM | POA: Insufficient documentation

## 2019-09-24 DIAGNOSIS — Z5321 Procedure and treatment not carried out due to patient leaving prior to being seen by health care provider: Secondary | ICD-10-CM | POA: Diagnosis not present

## 2019-09-24 DIAGNOSIS — R519 Headache, unspecified: Secondary | ICD-10-CM | POA: Diagnosis present

## 2019-09-24 DIAGNOSIS — R42 Dizziness and giddiness: Secondary | ICD-10-CM | POA: Insufficient documentation

## 2019-09-24 LAB — CBC
HCT: 43.7 % (ref 36.0–46.0)
Hemoglobin: 13.7 g/dL (ref 12.0–15.0)
MCH: 29.1 pg (ref 26.0–34.0)
MCHC: 31.4 g/dL (ref 30.0–36.0)
MCV: 92.8 fL (ref 80.0–100.0)
Platelets: 210 10*3/uL (ref 150–400)
RBC: 4.71 MIL/uL (ref 3.87–5.11)
RDW: 15.6 % — ABNORMAL HIGH (ref 11.5–15.5)
WBC: 21.7 10*3/uL — ABNORMAL HIGH (ref 4.0–10.5)
nRBC: 0 % (ref 0.0–0.2)

## 2019-09-24 LAB — BASIC METABOLIC PANEL
Anion gap: 10 (ref 5–15)
BUN: 19 mg/dL (ref 8–23)
CO2: 28 mmol/L (ref 22–32)
Calcium: 9.3 mg/dL (ref 8.9–10.3)
Chloride: 101 mmol/L (ref 98–111)
Creatinine, Ser: 1.03 mg/dL — ABNORMAL HIGH (ref 0.44–1.00)
GFR calc Af Amer: 58 mL/min — ABNORMAL LOW (ref >60)
GFR calc non Af Amer: 50 mL/min — ABNORMAL LOW (ref >60)
Glucose, Bld: 91 mg/dL (ref 70–99)
Potassium: 4.1 mmol/L (ref 3.5–5.1)
Sodium: 139 mmol/L (ref 135–145)

## 2019-09-24 MED ORDER — SODIUM CHLORIDE 0.9% FLUSH: 3.0000 mL | Freq: Once | INTRAVENOUS | Status: DC

## 2019-09-24 NOTE — ED Triage Notes (Signed)
Pt ambulatory to triage, says that she had a headache and lightheaded this morning, felt better mid day and then returned this afternoon, accompanied by intermittent bilateral blurry vision. Blood pressure was 195/87 at home this afternoon. She is not on BP medications. Speech clear, denies weakness.

## 2019-09-24 NOTE — ED Notes (Signed)
Full rainbow drawn at triage 

## 2019-09-30 ENCOUNTER — Inpatient Hospital Stay: Payer: Medicare Other | Attending: Internal Medicine

## 2019-09-30 ENCOUNTER — Inpatient Hospital Stay: Payer: Medicare Other

## 2019-09-30 ENCOUNTER — Other Ambulatory Visit: Payer: Self-pay

## 2019-09-30 DIAGNOSIS — I776 Arteritis, unspecified: Secondary | ICD-10-CM | POA: Insufficient documentation

## 2019-09-30 DIAGNOSIS — D708 Other neutropenia: Secondary | ICD-10-CM | POA: Insufficient documentation

## 2019-09-30 DIAGNOSIS — Z79899 Other long term (current) drug therapy: Secondary | ICD-10-CM | POA: Insufficient documentation

## 2019-09-30 DIAGNOSIS — Z840 Family history of diseases of the skin and subcutaneous tissue: Secondary | ICD-10-CM | POA: Insufficient documentation

## 2019-09-30 DIAGNOSIS — Z811 Family history of alcohol abuse and dependence: Secondary | ICD-10-CM | POA: Diagnosis not present

## 2019-09-30 DIAGNOSIS — Z87891 Personal history of nicotine dependence: Secondary | ICD-10-CM | POA: Insufficient documentation

## 2019-09-30 DIAGNOSIS — Z800 Family history of malignant neoplasm of digestive organs: Secondary | ICD-10-CM | POA: Insufficient documentation

## 2019-09-30 DIAGNOSIS — R030 Elevated blood-pressure reading, without diagnosis of hypertension: Secondary | ICD-10-CM | POA: Diagnosis not present

## 2019-09-30 DIAGNOSIS — M7989 Other specified soft tissue disorders: Secondary | ICD-10-CM | POA: Insufficient documentation

## 2019-09-30 DIAGNOSIS — D72819 Decreased white blood cell count, unspecified: Secondary | ICD-10-CM

## 2019-09-30 LAB — CBC WITH DIFFERENTIAL/PLATELET
Abs Immature Granulocytes: 0 10*3/uL (ref 0.00–0.07)
Basophils Absolute: 0 10*3/uL (ref 0.0–0.1)
Basophils Relative: 1 %
Eosinophils Absolute: 0 10*3/uL (ref 0.0–0.5)
Eosinophils Relative: 1 %
HCT: 43.6 % (ref 36.0–46.0)
Hemoglobin: 13.8 g/dL (ref 12.0–15.0)
Immature Granulocytes: 0 %
Lymphocytes Relative: 56 %
Lymphs Abs: 1 10*3/uL (ref 0.7–4.0)
MCH: 29.1 pg (ref 26.0–34.0)
MCHC: 31.7 g/dL (ref 30.0–36.0)
MCV: 91.8 fL (ref 80.0–100.0)
Monocytes Absolute: 0.2 10*3/uL (ref 0.1–1.0)
Monocytes Relative: 12 %
Neutro Abs: 0.5 10*3/uL — ABNORMAL LOW (ref 1.7–7.7)
Neutrophils Relative %: 30 %
Platelets: 197 10*3/uL (ref 150–400)
RBC: 4.75 MIL/uL (ref 3.87–5.11)
RDW: 15.4 % (ref 11.5–15.5)
WBC: 1.8 10*3/uL — ABNORMAL LOW (ref 4.0–10.5)
nRBC: 0 % (ref 0.0–0.2)

## 2019-09-30 MED ORDER — FILGRASTIM-SNDZ 480 MCG/0.8ML IJ SOSY
480.0000 ug | PREFILLED_SYRINGE | Freq: Once | INTRAMUSCULAR | Status: AC
  Administered 2019-09-30: 480 ug via SUBCUTANEOUS
  Filled 2019-09-30: qty 0.8

## 2019-10-06 ENCOUNTER — Telehealth: Payer: Self-pay | Admitting: *Deleted

## 2019-10-06 NOTE — Telephone Encounter (Signed)
Dr. Jacinto Reap - appears that she also contacted her pcp Dr. Edwina Barth as well re: the same concern.

## 2019-10-06 NOTE — Telephone Encounter (Signed)
Patient called reporting that she has a sinus infection that is hurting into her jaw and making her teeth hurt. She is asking if Dr B will order antibiotics for her and states that she has a partial prescription for Levaquin left over from last year and is asking if she can take that and if she would need more. Please advise

## 2019-10-06 NOTE — Telephone Encounter (Signed)
[place] with Dr. Rogue Bussing. Patient also contacted her pcp. Please have her pcp manage/prescribe the antibiotics.

## 2019-10-06 NOTE — Telephone Encounter (Signed)
All returned to patient and advised to let her PCP handled this for her

## 2019-10-07 ENCOUNTER — Inpatient Hospital Stay: Payer: Medicare Other

## 2019-10-07 ENCOUNTER — Other Ambulatory Visit: Payer: Self-pay

## 2019-10-07 DIAGNOSIS — D708 Other neutropenia: Secondary | ICD-10-CM

## 2019-10-07 DIAGNOSIS — D72819 Decreased white blood cell count, unspecified: Secondary | ICD-10-CM

## 2019-10-07 DIAGNOSIS — D709 Neutropenia, unspecified: Secondary | ICD-10-CM

## 2019-10-07 LAB — CBC WITH DIFFERENTIAL/PLATELET
Abs Immature Granulocytes: 0.03 10*3/uL (ref 0.00–0.07)
Basophils Absolute: 0 10*3/uL (ref 0.0–0.1)
Basophils Relative: 1 %
Eosinophils Absolute: 0 10*3/uL (ref 0.0–0.5)
Eosinophils Relative: 1 %
HCT: 42.8 % (ref 36.0–46.0)
Hemoglobin: 13.4 g/dL (ref 12.0–15.0)
Immature Granulocytes: 2 %
Lymphocytes Relative: 54 %
Lymphs Abs: 1.1 10*3/uL (ref 0.7–4.0)
MCH: 28.7 pg (ref 26.0–34.0)
MCHC: 31.3 g/dL (ref 30.0–36.0)
MCV: 91.6 fL (ref 80.0–100.0)
Monocytes Absolute: 0.2 10*3/uL (ref 0.1–1.0)
Monocytes Relative: 12 %
Neutro Abs: 0.6 10*3/uL — ABNORMAL LOW (ref 1.7–7.7)
Neutrophils Relative %: 30 %
Platelets: 209 10*3/uL (ref 150–400)
RBC: 4.67 MIL/uL (ref 3.87–5.11)
RDW: 15.7 % — ABNORMAL HIGH (ref 11.5–15.5)
WBC: 2 10*3/uL — ABNORMAL LOW (ref 4.0–10.5)
nRBC: 0 % (ref 0.0–0.2)

## 2019-10-07 MED ORDER — FILGRASTIM-SNDZ 480 MCG/0.8ML IJ SOSY
480.0000 ug | PREFILLED_SYRINGE | Freq: Once | INTRAMUSCULAR | Status: AC
  Administered 2019-10-07: 480 ug via SUBCUTANEOUS
  Filled 2019-10-07: qty 0.8

## 2019-10-14 ENCOUNTER — Inpatient Hospital Stay: Payer: Medicare Other

## 2019-10-14 ENCOUNTER — Other Ambulatory Visit: Payer: Self-pay

## 2019-10-14 DIAGNOSIS — D708 Other neutropenia: Secondary | ICD-10-CM | POA: Diagnosis not present

## 2019-10-14 DIAGNOSIS — D709 Neutropenia, unspecified: Secondary | ICD-10-CM

## 2019-10-14 DIAGNOSIS — D72819 Decreased white blood cell count, unspecified: Secondary | ICD-10-CM

## 2019-10-14 LAB — CBC WITH DIFFERENTIAL/PLATELET
Abs Immature Granulocytes: 0 10*3/uL (ref 0.00–0.07)
Basophils Absolute: 0 10*3/uL (ref 0.0–0.1)
Basophils Relative: 1 %
Eosinophils Absolute: 0 10*3/uL (ref 0.0–0.5)
Eosinophils Relative: 2 %
HCT: 43.4 % (ref 36.0–46.0)
Hemoglobin: 13.7 g/dL (ref 12.0–15.0)
Immature Granulocytes: 0 %
Lymphocytes Relative: 44 %
Lymphs Abs: 0.9 10*3/uL (ref 0.7–4.0)
MCH: 29 pg (ref 26.0–34.0)
MCHC: 31.6 g/dL (ref 30.0–36.0)
MCV: 91.9 fL (ref 80.0–100.0)
Monocytes Absolute: 0.2 10*3/uL (ref 0.1–1.0)
Monocytes Relative: 10 %
Neutro Abs: 0.9 10*3/uL — ABNORMAL LOW (ref 1.7–7.7)
Neutrophils Relative %: 43 %
Platelets: 185 10*3/uL (ref 150–400)
RBC: 4.72 MIL/uL (ref 3.87–5.11)
RDW: 15.3 % (ref 11.5–15.5)
WBC: 2 10*3/uL — ABNORMAL LOW (ref 4.0–10.5)
nRBC: 0 % (ref 0.0–0.2)

## 2019-10-14 MED ORDER — FILGRASTIM-SNDZ 480 MCG/0.8ML IJ SOSY
480.0000 ug | PREFILLED_SYRINGE | Freq: Once | INTRAMUSCULAR | Status: AC
  Administered 2019-10-14: 480 ug via SUBCUTANEOUS
  Filled 2019-10-14: qty 0.8

## 2019-10-21 ENCOUNTER — Other Ambulatory Visit: Payer: Self-pay

## 2019-10-21 ENCOUNTER — Telehealth: Payer: Self-pay | Admitting: *Deleted

## 2019-10-21 ENCOUNTER — Inpatient Hospital Stay: Payer: Medicare Other

## 2019-10-21 DIAGNOSIS — D708 Other neutropenia: Secondary | ICD-10-CM | POA: Diagnosis not present

## 2019-10-21 DIAGNOSIS — D709 Neutropenia, unspecified: Secondary | ICD-10-CM

## 2019-10-21 DIAGNOSIS — D72819 Decreased white blood cell count, unspecified: Secondary | ICD-10-CM

## 2019-10-21 LAB — CBC WITH DIFFERENTIAL/PLATELET
Abs Immature Granulocytes: 0 10*3/uL (ref 0.00–0.07)
Basophils Absolute: 0 10*3/uL (ref 0.0–0.1)
Basophils Relative: 1 %
Eosinophils Absolute: 0 10*3/uL (ref 0.0–0.5)
Eosinophils Relative: 1 %
HCT: 42.7 % (ref 36.0–46.0)
Hemoglobin: 13.6 g/dL (ref 12.0–15.0)
Immature Granulocytes: 0 %
Lymphocytes Relative: 54 %
Lymphs Abs: 1 10*3/uL (ref 0.7–4.0)
MCH: 29.2 pg (ref 26.0–34.0)
MCHC: 31.9 g/dL (ref 30.0–36.0)
MCV: 91.8 fL (ref 80.0–100.0)
Monocytes Absolute: 0.2 10*3/uL (ref 0.1–1.0)
Monocytes Relative: 11 %
Neutro Abs: 0.6 10*3/uL — ABNORMAL LOW (ref 1.7–7.7)
Neutrophils Relative %: 33 %
Platelets: 221 10*3/uL (ref 150–400)
RBC: 4.65 MIL/uL (ref 3.87–5.11)
RDW: 15.4 % (ref 11.5–15.5)
WBC: 1.8 10*3/uL — ABNORMAL LOW (ref 4.0–10.5)
nRBC: 0 % (ref 0.0–0.2)

## 2019-10-21 MED ORDER — FILGRASTIM-SNDZ 480 MCG/0.8ML IJ SOSY
480.0000 ug | PREFILLED_SYRINGE | Freq: Once | INTRAMUSCULAR | Status: AC
  Administered 2019-10-21: 480 ug via SUBCUTANEOUS
  Filled 2019-10-21: qty 0.8

## 2019-10-21 NOTE — Telephone Encounter (Signed)
Pt asked to speak to [facility] after her zarxio. Patient is concerned that her wbc/anc is not improving with her current treatment. Patient wanted to have an apt in the clinic today with Dr. Rogue Bussing. I informed patient that she has an apt next Tuesday with Dr. Rogue Bussing to discus alternative treatments. I will speak to the doctor about her concerns and that Dr. B is with another patient at this time. Pt informed that Dr. B can discuss this with the patient at the next apt on 6/1. Patient thanked me for speaking to her. She states that she didn't remember that she was seeing Dr. B next week. She is agreeable to wait until next week to further discuss her concerns with Dr. Jacinto Reap.

## 2019-10-28 ENCOUNTER — Other Ambulatory Visit: Payer: Self-pay

## 2019-10-28 ENCOUNTER — Inpatient Hospital Stay: Payer: Medicare Other | Attending: Internal Medicine

## 2019-10-28 ENCOUNTER — Inpatient Hospital Stay: Payer: Medicare Other

## 2019-10-28 DIAGNOSIS — Z885 Allergy status to narcotic agent status: Secondary | ICD-10-CM | POA: Diagnosis not present

## 2019-10-28 DIAGNOSIS — Z87891 Personal history of nicotine dependence: Secondary | ICD-10-CM | POA: Diagnosis not present

## 2019-10-28 DIAGNOSIS — D708 Other neutropenia: Secondary | ICD-10-CM | POA: Insufficient documentation

## 2019-10-28 DIAGNOSIS — Z811 Family history of alcohol abuse and dependence: Secondary | ICD-10-CM | POA: Diagnosis not present

## 2019-10-28 DIAGNOSIS — Z87442 Personal history of urinary calculi: Secondary | ICD-10-CM | POA: Insufficient documentation

## 2019-10-28 DIAGNOSIS — D72819 Decreased white blood cell count, unspecified: Secondary | ICD-10-CM

## 2019-10-28 DIAGNOSIS — I776 Arteritis, unspecified: Secondary | ICD-10-CM | POA: Insufficient documentation

## 2019-10-28 DIAGNOSIS — Z800 Family history of malignant neoplasm of digestive organs: Secondary | ICD-10-CM | POA: Diagnosis not present

## 2019-10-28 DIAGNOSIS — Z853 Personal history of malignant neoplasm of breast: Secondary | ICD-10-CM | POA: Diagnosis not present

## 2019-10-28 DIAGNOSIS — D709 Neutropenia, unspecified: Secondary | ICD-10-CM

## 2019-10-28 DIAGNOSIS — Z7952 Long term (current) use of systemic steroids: Secondary | ICD-10-CM | POA: Insufficient documentation

## 2019-10-28 DIAGNOSIS — Z85828 Personal history of other malignant neoplasm of skin: Secondary | ICD-10-CM | POA: Insufficient documentation

## 2019-10-28 DIAGNOSIS — Z840 Family history of diseases of the skin and subcutaneous tissue: Secondary | ICD-10-CM | POA: Diagnosis not present

## 2019-10-28 DIAGNOSIS — M7989 Other specified soft tissue disorders: Secondary | ICD-10-CM | POA: Insufficient documentation

## 2019-10-28 DIAGNOSIS — Z9013 Acquired absence of bilateral breasts and nipples: Secondary | ICD-10-CM | POA: Diagnosis not present

## 2019-10-28 DIAGNOSIS — Z79899 Other long term (current) drug therapy: Secondary | ICD-10-CM | POA: Diagnosis not present

## 2019-10-28 DIAGNOSIS — M359 Systemic involvement of connective tissue, unspecified: Secondary | ICD-10-CM | POA: Insufficient documentation

## 2019-10-28 DIAGNOSIS — Z8719 Personal history of other diseases of the digestive system: Secondary | ICD-10-CM | POA: Insufficient documentation

## 2019-10-28 DIAGNOSIS — R030 Elevated blood-pressure reading, without diagnosis of hypertension: Secondary | ICD-10-CM | POA: Insufficient documentation

## 2019-10-28 LAB — CBC WITH DIFFERENTIAL/PLATELET
Abs Immature Granulocytes: 0.05 10*3/uL (ref 0.00–0.07)
Basophils Absolute: 0 10*3/uL (ref 0.0–0.1)
Basophils Relative: 1 %
Eosinophils Absolute: 0 10*3/uL (ref 0.0–0.5)
Eosinophils Relative: 1 %
HCT: 44.3 % (ref 36.0–46.0)
Hemoglobin: 14.3 g/dL (ref 12.0–15.0)
Immature Granulocytes: 3 %
Lymphocytes Relative: 46 %
Lymphs Abs: 0.8 10*3/uL (ref 0.7–4.0)
MCH: 29.6 pg (ref 26.0–34.0)
MCHC: 32.3 g/dL (ref 30.0–36.0)
MCV: 91.7 fL (ref 80.0–100.0)
Monocytes Absolute: 0.2 10*3/uL (ref 0.1–1.0)
Monocytes Relative: 11 %
Neutro Abs: 0.7 10*3/uL — ABNORMAL LOW (ref 1.7–7.7)
Neutrophils Relative %: 38 %
Platelets: 201 10*3/uL (ref 150–400)
RBC: 4.83 MIL/uL (ref 3.87–5.11)
RDW: 15.5 % (ref 11.5–15.5)
WBC: 1.8 10*3/uL — ABNORMAL LOW (ref 4.0–10.5)
nRBC: 0 % (ref 0.0–0.2)

## 2019-10-28 MED ORDER — FILGRASTIM-SNDZ 480 MCG/0.8ML IJ SOSY
480.0000 ug | PREFILLED_SYRINGE | Freq: Once | INTRAMUSCULAR | Status: AC
  Administered 2019-10-28: 480 ug via SUBCUTANEOUS
  Filled 2019-10-28: qty 0.8

## 2019-11-04 ENCOUNTER — Inpatient Hospital Stay: Payer: Medicare Other

## 2019-11-04 ENCOUNTER — Other Ambulatory Visit: Payer: Self-pay

## 2019-11-04 ENCOUNTER — Encounter: Payer: Self-pay | Admitting: Internal Medicine

## 2019-11-04 ENCOUNTER — Inpatient Hospital Stay (HOSPITAL_BASED_OUTPATIENT_CLINIC_OR_DEPARTMENT_OTHER): Payer: Medicare Other | Admitting: Internal Medicine

## 2019-11-04 DIAGNOSIS — D72819 Decreased white blood cell count, unspecified: Secondary | ICD-10-CM

## 2019-11-04 DIAGNOSIS — D708 Other neutropenia: Secondary | ICD-10-CM | POA: Diagnosis not present

## 2019-11-04 DIAGNOSIS — D709 Neutropenia, unspecified: Secondary | ICD-10-CM

## 2019-11-04 LAB — CBC WITH DIFFERENTIAL/PLATELET
Abs Immature Granulocytes: 0.01 10*3/uL (ref 0.00–0.07)
Basophils Absolute: 0 10*3/uL (ref 0.0–0.1)
Basophils Relative: 1 %
Eosinophils Absolute: 0 10*3/uL (ref 0.0–0.5)
Eosinophils Relative: 0 %
HCT: 43 % (ref 36.0–46.0)
Hemoglobin: 13.7 g/dL (ref 12.0–15.0)
Immature Granulocytes: 1 %
Lymphocytes Relative: 54 %
Lymphs Abs: 1.1 10*3/uL (ref 0.7–4.0)
MCH: 29.4 pg (ref 26.0–34.0)
MCHC: 31.9 g/dL (ref 30.0–36.0)
MCV: 92.3 fL (ref 80.0–100.0)
Monocytes Absolute: 0.2 10*3/uL (ref 0.1–1.0)
Monocytes Relative: 9 %
Neutro Abs: 0.7 10*3/uL — ABNORMAL LOW (ref 1.7–7.7)
Neutrophils Relative %: 35 %
Platelets: 193 10*3/uL (ref 150–400)
RBC: 4.66 MIL/uL (ref 3.87–5.11)
RDW: 15.5 % (ref 11.5–15.5)
WBC: 1.9 10*3/uL — ABNORMAL LOW (ref 4.0–10.5)
nRBC: 0 % (ref 0.0–0.2)

## 2019-11-04 MED ORDER — FILGRASTIM-SNDZ 480 MCG/0.8ML IJ SOSY
480.0000 ug | PREFILLED_SYRINGE | Freq: Once | INTRAMUSCULAR | Status: AC
  Administered 2019-11-04: 480 ug via SUBCUTANEOUS
  Filled 2019-11-04: qty 0.8

## 2019-11-04 MED ORDER — PREDNISONE 10 MG PO TABS: 10.0000 mg | ORAL_TABLET | Freq: Every day | ORAL | 0 refills | Status: DC

## 2019-11-04 NOTE — Progress Notes (Signed)
Pt in for follow up, states she wants to discuss other treatment options with MD.

## 2019-11-04 NOTE — Assessment & Plan Note (Addendum)
#   Autoimmune neutropenia- once a week- if ANC < 1.5- zaxio-patient's ANC today-0.7. continue weekly zarxio.  Patient clinically asymptomatic.  However significantly concerned regarding continued ANC less than thousand.  #Given the autoimmune etiology of neutropenia discussed-trial of steroids 1 mg/kg.  However patient is concerned about the side effects ; will proceed with trial of increasing the dose of prednisone to 10 mg a day.  New prescription given.  Discussed option of alternative growth factors - like- Neupogen/insurance issues.  # Body aches joint pains worse-question Zarxio vs others.  Discussed holding however patient concerned about risk of infections.  Recommend NSAIDs.  # Hx of temporal arteritis->20 years on low dose 2.5 mg/day prednisone-stable.  See above  #Elevated blood pressure ; no history of [id number] systolic-likely situational/anxiety.  Recommend close monitoring at home.  # DISPOSITION: # zaxio today; # weekly cbc/zaxio x4 # follow up with MD in 4  weeks- cbc/bmp-zaxio-Dr.B

## 2019-11-04 NOTE — Progress Notes (Signed)
[place] NOTE  Patient Care Team: Baxter Hire, MD as PCP - General (Internal Medicine) Emmaline Kluver., MD (Rheumatology) Dasher, Rayvon Char, MD (Dermatology) Cammie Sickle, MD as Consulting Physician (Hematology and Oncology)  CHIEF COMPLAINTS/PURPOSE OF CONSULTATION: Autoimmune neutropenia  #April 2019- Autoimmune neutropenia-weekly Granix/CBC [Dr.Crocoran]; BMBx- no malignancy [Bone marrow aspirate and biopsy on 09/04/2017 revealed a hypercellular marrow for age with trilineage hematopoiesis.  There was abundant mature neutrophils.  Significant dyspoiesis or increased blasts was not identified.  Flow cytometry revealed a predominance of T lymphocytes (16% of all cells) with no abnormal phenotype.  There was a minor B-cell population (13% of lymphocytes) with slight kappa light chain excess.  Cytogenetics revealed 74, XX, del(20)(q11.2)[2] / 46,XX[18].]; weekly cbc/granix [until July 2020]; Aug 2020- Zarxio;    #   temporal arteritis [2003] chronic prednisone/ 2.5 mg a day; Dr.Kernodle.  Oncology History   No history exists.    HISTORY OF PRESENTING ILLNESS:  [name] 85 y.o.  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  Patient has not had any infections.  Patient has not needed any antibiotics.  No hospitalizations.  Patient however is very concerned about her continued [place] less than 1000.   Denies any fevers or chills.  Denies any worsening skin rash.  Review of Systems  Constitutional: Negative for chills, diaphoresis, fever, malaise/fatigue and weight loss.  HENT: Negative for nosebleeds and sore throat.   Eyes: Negative for double vision.  Respiratory: Negative for cough, hemoptysis, sputum production, shortness of breath and wheezing.   Cardiovascular: Positive for leg swelling. Negative for chest pain, palpitations and orthopnea.  Gastrointestinal: Negative for  blood in stool, constipation, diarrhea, heartburn, melena, nausea and vomiting.       Around parastomal hernia.  Genitourinary: Negative for dysuria, frequency and urgency.  Musculoskeletal: Negative for back pain and joint pain.  Skin: Negative for itching.  Neurological: Positive for tingling. Negative for dizziness, focal weakness, weakness and headaches.  Endo/Heme/Allergies: Bruises/bleeds easily.  Psychiatric/Behavioral: Negative for depression. The patient is nervous/anxious. The patient does not have insomnia.      MEDICAL HISTORY:  Past Medical History:  Diagnosis Date  . Breast cancer ([place]) 1985   bilateral mastectomies  . Depression    1 time specific situation that she did not know how to deal wiht  . Diverticulosis   . Hiatal hernia   . History of Bell's palsy   . History of colon polyps   . History of nephrolithiasis   . History of pancreatitis   . Hyperlipidemia   . IBS (irritable bowel syndrome)   . Osteoporosis    osteopenia in neck  . Skin cancer   . Temporal arteritis ([place])     SURGICAL HISTORY: Past Surgical History:  Procedure Laterality Date  . AUGMENTATION MAMMAPLASTY Bilateral 2000  . COLON RESECTION SIGMOID N/A 10/13/2017   Procedure: COLON RESECTION SIGMOID;  Surgeon: Herbert Pun, MD;  Location: [facility] ORS;  Service: General;  Laterality: N/A;  . COLOSTOMY N/A 10/13/2017   Procedure: COLOSTOMY;  Surgeon: Herbert Pun, MD;  Location: [facility] ORS;  Service: General;  Laterality: N/A;  . LAPAROSCOPIC CHOLECYSTECTOMY  2009  . MASTECTOMY  1983   bilateral    SOCIAL HISTORY: Social History   Socioeconomic History  . Marital status: Widowed    Spouse name: Not on file  . Number of children: 2  . Years of education: college  .  Highest education level: Not on file  Occupational History  . Occupation: Engineer, production  . Occupation: Retired  Tobacco Use  . Smoking status: Former Smoker    Packs/day: 0.25    Years: 30.00    Pack years:  7.50    Types: Cigarettes    Quit date: 05/17/2005    Years since quitting: 14.4  . Smokeless tobacco: Never Used  . Tobacco comment: quit 11 years ago  Vaping Use  . Vaping Use: Never used  Substance and Sexual Activity  . Alcohol use: No    Alcohol/week: 0.0 standard drinks  . Drug use: No  . Sexual activity: Not Currently  Other Topics Concern  . Not on file  Social History Narrative   Patient lives at home alone.    Patient is retired.    Patient has some college.    Patient has 2 children.   Social Determinants of Health   Financial Resource Strain:   . Difficulty of Paying Living Expenses:   Food Insecurity:   . Worried About Charity fundraiser in the Last Year:   . Arboriculturist in the Last Year:   Transportation Needs:   . Film/video editor (Medical):   Marland Kitchen Lack of Transportation (Non-Medical):   Physical Activity:   . Days of Exercise per Week:   . Minutes of Exercise per Session:   Stress:   . Feeling of Stress :   Social Connections:   . Frequency of Communication with Friends and Family:   . Frequency of Social Gatherings with Friends and Family:   . Attends Religious Services:   . Active Member of Clubs or Organizations:   . Attends Archivist Meetings:   Marland Kitchen Marital Status:   Intimate Partner Violence:   . Fear of Current or Ex-Partner:   . Emotionally Abused:   Marland Kitchen Physically Abused:   . Sexually Abused:     FAMILY HISTORY: Family History  Problem Relation Age of Onset  . Colon cancer Mother 75  . Scleroderma Father 24  . Alcoholism Brother   . Stomach cancer Neg Hx     ALLERGIES:  is allergic to morphine, benzalkonium chloride, escitalopram, hydralazine, neomycin-bacitracin zn-polymyx, and bacitracin-neomycin-polymyxin.  MEDICATIONS:  Current Outpatient Medications  Medication Sig Dispense Refill  . ALPRAZolam (XANAX) 0.25 MG tablet Take 0.25 mg by mouth 2 (two) times daily as needed.     Marland Kitchen amLODipine (NORVASC) 5 MG tablet  Take by mouth.    . Ascorbic Acid (VITAMIN C) 1000 MG tablet Take 1,000 mg by mouth daily.    Marland Kitchen aspirin EC 81 MG tablet Take 81 mg daily by mouth.    . Biotin 1000 MCG tablet Take 1,000 mcg by mouth daily.     . Cholecalciferol (VITAMIN D3) 2000 units capsule Take 2,000 Units daily by mouth.     . Cyanocobalamin (B-12 PO) Take 2,500 mcg daily by mouth.     Karle Barr (ZARXIO) 480 MCG/0.8ML SOSY injection Inject 0.8 mLs (480 mcg total) into the skin once a week. Administer injection subcutaneously 3.2 mL 6  . fluorouracil (EFUDEX) 5 % cream Apply 1 application topically 2 (two) times daily.    . Multiple Vitamins-Minerals (ZINC PO) Take by mouth.    . predniSONE (DELTASONE) 5 MG tablet Take 2.5 mg by mouth daily.     . traMADol (ULTRAM) 50 MG tablet Take 50 mg by mouth daily.     . predniSONE (DELTASONE) 10 MG tablet Take 1  tablet (10 mg total) by mouth daily with breakfast. 30 tablet 0   No current facility-administered medications for this visit.    PHYSICAL EXAMINATION: ECOG PERFORMANCE STATUS: 0 - Asymptomatic  Vitals:   11/04/19 1408  BP: (!) 173/59  Pulse: 85  Resp: 18  Temp: (!) 97.3 F (36.3 C)  SpO2: 100%   Filed Weights   11/04/19 1408  Weight: 167 lb (75.8 kg)    Physical Exam  Constitutional: She is oriented to person, place, and time and well-developed, well-nourished, and in no distress.  Alone.  HENT:  Head: Normocephalic and atraumatic.  Mouth/Throat: Oropharynx is clear and moist. No oropharyngeal exudate.  Eyes: Pupils are equal, round, and reactive to light.  Cardiovascular: Normal rate and regular rhythm.  Pulmonary/Chest: No respiratory distress. She has no wheezes.  Abdominal: Soft. Bowel sounds are normal. She exhibits no distension and no mass. There is no abdominal tenderness. There is no rebound and no guarding.  Colostomy.  Parastomal hernia.  Musculoskeletal:        General: No tenderness or edema. Normal range of motion.     Cervical  back: Normal range of motion and neck supple.  Neurological: She is alert and oriented to person, place, and time.  Skin: Skin is warm.  Hyperpigmented rash noted bilateral shins.  Psychiatric: Affect normal.  Anxious.     LABORATORY DATA:  I have reviewed the data as listed Lab Results  Component Value Date   WBC 1.9 (L) 11/04/2019   HGB 13.7 11/04/2019   HCT 43.0 11/04/2019   MCV 92.3 11/04/2019   PLT 193 11/04/2019   Recent Labs    04/01/19 0942 06/24/19 1006 09/24/19 1916  NA 140 141 139  K 4.5 3.9 4.1  CL 103 105 101  CO2 27 26 28   GLUCOSE 89 113* 91  BUN 19 16 19   CREATININE 0.89 0.90 1.03*  CALCIUM 9.2 9.3 9.3  GFRNONAA >60 60* 50*  GFRAA >60 >60 58*    RADIOGRAPHIC STUDIES: I have personally reviewed the radiological images as listed and agreed with the findings in the report. No results found.  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 continue weekly zarxio.  Patient clinically asymptomatic.  However significantly concerned regarding continued ANC less than thousand.  #Given the autoimmune etiology of neutropenia discussed-trial of steroids 1 mg/kg.  However patient is concerned about the side effects ; will proceed with trial of increasing the dose of prednisone to 10 mg a day.  New prescription given.  Discussed option of alternative growth factors - like- Neupogen/insurance issues.  # Body aches joint pains worse-question Zarxio vs others.  Discussed holding however patient concerned about risk of infections.  Recommend NSAIDs.  # Hx of temporal arteritis->20 years on low dose 2.5 mg/day prednisone-stable.  See above  #Elevated blood pressure ; no history of [id number] systolic-likely situational/anxiety.  Recommend close monitoring at home.  # DISPOSITION: # zaxio today; # weekly cbc/zaxio x4 # follow up with MD in 4  weeks- cbc/bmp-zaxio-Dr.B  All  questions were answered. The patient knows to call the clinic with any problems, questions or concerns.    Cammie Sickle, MD 11/06/2019 7:44 PM

## 2019-11-11 ENCOUNTER — Other Ambulatory Visit: Payer: Self-pay

## 2019-11-11 ENCOUNTER — Inpatient Hospital Stay: Payer: Medicare Other

## 2019-11-11 DIAGNOSIS — D709 Neutropenia, unspecified: Secondary | ICD-10-CM

## 2019-11-11 DIAGNOSIS — D708 Other neutropenia: Secondary | ICD-10-CM | POA: Diagnosis not present

## 2019-11-11 LAB — CBC WITH DIFFERENTIAL/PLATELET
Abs Immature Granulocytes: 0.13 10*3/uL — ABNORMAL HIGH (ref 0.00–0.07)
Basophils Absolute: 0 10*3/uL (ref 0.0–0.1)
Basophils Relative: 0 %
Eosinophils Absolute: 0 10*3/uL (ref 0.0–0.5)
Eosinophils Relative: 0 %
HCT: 43.7 % (ref 36.0–46.0)
Hemoglobin: 14.1 g/dL (ref 12.0–15.0)
Immature Granulocytes: 5 %
Lymphocytes Relative: 23 %
Lymphs Abs: 0.6 10*3/uL — ABNORMAL LOW (ref 0.7–4.0)
MCH: 29.4 pg (ref 26.0–34.0)
MCHC: 32.3 g/dL (ref 30.0–36.0)
MCV: 91.2 fL (ref 80.0–100.0)
Monocytes Absolute: 0.1 10*3/uL (ref 0.1–1.0)
Monocytes Relative: 3 %
Neutro Abs: 1.9 10*3/uL (ref 1.7–7.7)
Neutrophils Relative %: 69 %
Platelets: 228 10*3/uL (ref 150–400)
RBC: 4.79 MIL/uL (ref 3.87–5.11)
RDW: 15.4 % (ref 11.5–15.5)
Smear Review: NORMAL
WBC: 2.7 10*3/uL — ABNORMAL LOW (ref 4.0–10.5)
nRBC: 0 % (ref 0.0–0.2)

## 2019-11-18 ENCOUNTER — Inpatient Hospital Stay: Payer: Medicare Other

## 2019-11-18 ENCOUNTER — Other Ambulatory Visit: Payer: Self-pay

## 2019-11-18 DIAGNOSIS — D709 Neutropenia, unspecified: Secondary | ICD-10-CM

## 2019-11-18 DIAGNOSIS — D708 Other neutropenia: Secondary | ICD-10-CM | POA: Diagnosis not present

## 2019-11-18 LAB — CBC WITH DIFFERENTIAL/PLATELET
Abs Immature Granulocytes: 0.02 10*3/uL (ref 0.00–0.07)
Basophils Absolute: 0 10*3/uL (ref 0.0–0.1)
Basophils Relative: 0 %
Eosinophils Absolute: 0 10*3/uL (ref 0.0–0.5)
Eosinophils Relative: 0 %
HCT: 43.6 % (ref 36.0–46.0)
Hemoglobin: 13.9 g/dL (ref 12.0–15.0)
Immature Granulocytes: 1 %
Lymphocytes Relative: 24 %
Lymphs Abs: 0.7 10*3/uL (ref 0.7–4.0)
MCH: 29.4 pg (ref 26.0–34.0)
MCHC: 31.9 g/dL (ref 30.0–36.0)
MCV: 92.2 fL (ref 80.0–100.0)
Monocytes Absolute: 0.1 10*3/uL (ref 0.1–1.0)
Monocytes Relative: 4 %
Neutro Abs: 2 10*3/uL (ref 1.7–7.7)
Neutrophils Relative %: 71 %
Platelets: 230 10*3/uL (ref 150–400)
RBC: 4.73 MIL/uL (ref 3.87–5.11)
RDW: 15.5 % (ref 11.5–15.5)
WBC: 2.9 10*3/uL — ABNORMAL LOW (ref 4.0–10.5)
nRBC: 0 % (ref 0.0–0.2)

## 2019-11-25 ENCOUNTER — Other Ambulatory Visit: Payer: Self-pay | Admitting: *Deleted

## 2019-11-25 ENCOUNTER — Inpatient Hospital Stay: Payer: Medicare Other

## 2019-11-25 ENCOUNTER — Other Ambulatory Visit: Payer: Self-pay

## 2019-11-25 DIAGNOSIS — D72819 Decreased white blood cell count, unspecified: Secondary | ICD-10-CM

## 2019-11-25 DIAGNOSIS — D708 Other neutropenia: Secondary | ICD-10-CM | POA: Diagnosis not present

## 2019-11-25 LAB — CBC WITH DIFFERENTIAL/PLATELET
Abs Immature Granulocytes: 0.01 10*3/uL (ref 0.00–0.07)
Basophils Absolute: 0 10*3/uL (ref 0.0–0.1)
Basophils Relative: 1 %
Eosinophils Absolute: 0 10*3/uL (ref 0.0–0.5)
Eosinophils Relative: 0 %
HCT: 43.5 % (ref 36.0–46.0)
Hemoglobin: 14 g/dL (ref 12.0–15.0)
Immature Granulocytes: 1 %
Lymphocytes Relative: 27 %
Lymphs Abs: 0.6 10*3/uL — ABNORMAL LOW (ref 0.7–4.0)
MCH: 29.6 pg (ref 26.0–34.0)
MCHC: 32.2 g/dL (ref 30.0–36.0)
MCV: 92 fL (ref 80.0–100.0)
Monocytes Absolute: 0.1 10*3/uL (ref 0.1–1.0)
Monocytes Relative: 3 %
Neutro Abs: 1.4 10*3/uL — ABNORMAL LOW (ref 1.7–7.7)
Neutrophils Relative %: 68 %
Platelets: 233 10*3/uL (ref 150–400)
RBC: 4.73 MIL/uL (ref 3.87–5.11)
RDW: 15.5 % (ref 11.5–15.5)
WBC: 2 10*3/uL — ABNORMAL LOW (ref 4.0–10.5)
nRBC: 0 % (ref 0.0–0.2)

## 2019-11-25 MED ORDER — FILGRASTIM-SNDZ 480 MCG/0.8ML IJ SOSY
480.0000 ug | PREFILLED_SYRINGE | Freq: Once | INTRAMUSCULAR | Status: AC
  Administered 2019-11-25: 480 ug via SUBCUTANEOUS
  Filled 2019-11-25: qty 0.8

## 2019-12-02 ENCOUNTER — Other Ambulatory Visit: Payer: Self-pay

## 2019-12-02 ENCOUNTER — Inpatient Hospital Stay: Payer: Medicare Other

## 2019-12-02 ENCOUNTER — Inpatient Hospital Stay: Payer: Medicare Other | Attending: Internal Medicine

## 2019-12-02 DIAGNOSIS — Z86010 Personal history of colonic polyps: Secondary | ICD-10-CM | POA: Diagnosis not present

## 2019-12-02 DIAGNOSIS — Z79899 Other long term (current) drug therapy: Secondary | ICD-10-CM | POA: Insufficient documentation

## 2019-12-02 DIAGNOSIS — M7989 Other specified soft tissue disorders: Secondary | ICD-10-CM | POA: Insufficient documentation

## 2019-12-02 DIAGNOSIS — Z811 Family history of alcohol abuse and dependence: Secondary | ICD-10-CM | POA: Insufficient documentation

## 2019-12-02 DIAGNOSIS — Z8269 Family history of other diseases of the musculoskeletal system and connective tissue: Secondary | ICD-10-CM | POA: Diagnosis not present

## 2019-12-02 DIAGNOSIS — Z800 Family history of malignant neoplasm of digestive organs: Secondary | ICD-10-CM | POA: Diagnosis not present

## 2019-12-02 DIAGNOSIS — Z853 Personal history of malignant neoplasm of breast: Secondary | ICD-10-CM | POA: Insufficient documentation

## 2019-12-02 DIAGNOSIS — Z85828 Personal history of other malignant neoplasm of skin: Secondary | ICD-10-CM | POA: Diagnosis not present

## 2019-12-02 DIAGNOSIS — D702 Other drug-induced agranulocytosis: Secondary | ICD-10-CM | POA: Diagnosis not present

## 2019-12-02 DIAGNOSIS — D72819 Decreased white blood cell count, unspecified: Secondary | ICD-10-CM

## 2019-12-02 DIAGNOSIS — T451X5A Adverse effect of antineoplastic and immunosuppressive drugs, initial encounter: Secondary | ICD-10-CM | POA: Insufficient documentation

## 2019-12-02 DIAGNOSIS — I10 Essential (primary) hypertension: Secondary | ICD-10-CM | POA: Diagnosis not present

## 2019-12-02 DIAGNOSIS — Z8719 Personal history of other diseases of the digestive system: Secondary | ICD-10-CM | POA: Diagnosis not present

## 2019-12-02 DIAGNOSIS — Z9013 Acquired absence of bilateral breasts and nipples: Secondary | ICD-10-CM | POA: Insufficient documentation

## 2019-12-02 DIAGNOSIS — Z87891 Personal history of nicotine dependence: Secondary | ICD-10-CM | POA: Insufficient documentation

## 2019-12-02 DIAGNOSIS — Z87442 Personal history of urinary calculi: Secondary | ICD-10-CM | POA: Insufficient documentation

## 2019-12-02 DIAGNOSIS — Z885 Allergy status to narcotic agent status: Secondary | ICD-10-CM | POA: Diagnosis not present

## 2019-12-02 LAB — CBC WITH DIFFERENTIAL/PLATELET
Abs Immature Granulocytes: 0.23 10*3/uL — ABNORMAL HIGH (ref 0.00–0.07)
Basophils Absolute: 0 10*3/uL (ref 0.0–0.1)
Basophils Relative: 0 %
Eosinophils Absolute: 0 10*3/uL (ref 0.0–0.5)
Eosinophils Relative: 0 %
HCT: 42.3 % (ref 36.0–46.0)
Hemoglobin: 13.6 g/dL (ref 12.0–15.0)
Immature Granulocytes: 6 %
Lymphocytes Relative: 15 %
Lymphs Abs: 0.6 10*3/uL — ABNORMAL LOW (ref 0.7–4.0)
MCH: 29.3 pg (ref 26.0–34.0)
MCHC: 32.2 g/dL (ref 30.0–36.0)
MCV: 91.2 fL (ref 80.0–100.0)
Monocytes Absolute: 0.1 10*3/uL (ref 0.1–1.0)
Monocytes Relative: 4 %
Neutro Abs: 3 10*3/uL (ref 1.7–7.7)
Neutrophils Relative %: 75 %
Platelets: 187 10*3/uL (ref 150–400)
RBC: 4.64 MIL/uL (ref 3.87–5.11)
RDW: 15.7 % — ABNORMAL HIGH (ref 11.5–15.5)
Smear Review: NORMAL
WBC: 4 10*3/uL (ref 4.0–10.5)
nRBC: 0 % (ref 0.0–0.2)

## 2019-12-03 ENCOUNTER — Other Ambulatory Visit: Payer: Self-pay | Admitting: *Deleted

## 2019-12-04 NOTE — Telephone Encounter (Signed)
patient needs to contact her RA provider for RF. Dr. Rogue Bussing will not RF script.

## 2019-12-08 ENCOUNTER — Encounter: Payer: Self-pay | Admitting: Internal Medicine

## 2019-12-08 NOTE — Progress Notes (Signed)
Patient was called for pre assessment. She denies any pain. She would like to discuss why prednisone was not refilled by him and was referred to rheumatology.

## 2019-12-09 ENCOUNTER — Encounter: Payer: Self-pay | Admitting: Internal Medicine

## 2019-12-09 ENCOUNTER — Inpatient Hospital Stay (HOSPITAL_BASED_OUTPATIENT_CLINIC_OR_DEPARTMENT_OTHER): Payer: Medicare Other | Admitting: Internal Medicine

## 2019-12-09 ENCOUNTER — Other Ambulatory Visit: Payer: Self-pay

## 2019-12-09 ENCOUNTER — Inpatient Hospital Stay: Payer: Medicare Other

## 2019-12-09 DIAGNOSIS — D72819 Decreased white blood cell count, unspecified: Secondary | ICD-10-CM

## 2019-12-09 DIAGNOSIS — D702 Other drug-induced agranulocytosis: Secondary | ICD-10-CM | POA: Diagnosis not present

## 2019-12-09 DIAGNOSIS — D708 Other neutropenia: Secondary | ICD-10-CM

## 2019-12-09 LAB — CBC WITH DIFFERENTIAL/PLATELET
Abs Immature Granulocytes: 0.05 10*3/uL (ref 0.00–0.07)
Basophils Absolute: 0 10*3/uL (ref 0.0–0.1)
Basophils Relative: 0 %
Eosinophils Absolute: 0 10*3/uL (ref 0.0–0.5)
Eosinophils Relative: 1 %
HCT: 42.2 % (ref 36.0–46.0)
Hemoglobin: 13.5 g/dL (ref 12.0–15.0)
Immature Granulocytes: 2 %
Lymphocytes Relative: 29 %
Lymphs Abs: 0.8 10*3/uL (ref 0.7–4.0)
MCH: 29.2 pg (ref 26.0–34.0)
MCHC: 32 g/dL (ref 30.0–36.0)
MCV: 91.3 fL (ref 80.0–100.0)
Monocytes Absolute: 0.3 10*3/uL (ref 0.1–1.0)
Monocytes Relative: 9 %
Neutro Abs: 1.7 10*3/uL (ref 1.7–7.7)
Neutrophils Relative %: 59 %
Platelets: 195 10*3/uL (ref 150–400)
RBC: 4.62 MIL/uL (ref 3.87–5.11)
RDW: 15.8 % — ABNORMAL HIGH (ref 11.5–15.5)
WBC: 2.9 10*3/uL — ABNORMAL LOW (ref 4.0–10.5)
nRBC: 0 % (ref 0.0–0.2)

## 2019-12-09 NOTE — Assessment & Plan Note (Addendum)
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  Patient clinically asymptomatic.  However significantly concerned regarding continued ANC less than thousand.  # Prednisone 10 mg/day; improved; Will continue prednisone 5 mg/day [will refill]; discussed the potential long-term side effects of steroids including elevated blood pressure risk of infections/easy bruising etc.  # Body aches joint pains worse-question Zarxio vs others.  Improved as patient has been not needing growth factor.  # Hx of temporal arteritis->20 years on low dose prednisone-stable.  See above.   # Elevated blood pressure ; no history of [id number] systolic-likely situational/anxiety. STABLE;   Recommend close monitoring at home.  # Easy burising: sec to prednisone. Monitor closely.   # DISPOSITION: # HOLD Zarxio # weekly cbc/zaxio x 4 # follow up with MD in 4  weeks- cbc/bmp-zaxio-Dr.B

## 2019-12-09 NOTE — Progress Notes (Signed)
[place] NOTE  Patient Care Team: Baxter Hire, MD as PCP - General (Internal Medicine) Emmaline Kluver., MD (Rheumatology) Dasher, Rayvon Char, MD (Dermatology) Cammie Sickle, MD as Consulting Physician (Hematology and Oncology)  CHIEF COMPLAINTS/PURPOSE OF CONSULTATION: Autoimmune neutropenia  #April 2019- Autoimmune neutropenia-weekly Granix/CBC [Dr.Crocoran]; BMBx- no malignancy [Bone marrow aspirate and biopsy on 09/04/2017 revealed a hypercellular marrow for age with trilineage hematopoiesis.  There was abundant mature neutrophils.  Significant dyspoiesis or increased blasts was not identified.  Flow cytometry revealed a predominance of T lymphocytes (16% of all cells) with no abnormal phenotype.  There was a minor B-cell population (13% of lymphocytes) with slight kappa light chain excess.  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  Oncology History   No history exists.    HISTORY OF PRESENTING ILLNESS:  [name] 85 y.o.  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  At last visit patient's dose of prednisone was increased to 10 mg a day to help the autoimmune neutropenia.  Patient [place] has been 1.5.  Patient has not had any infections.  Not needing antibiotics no hospitalizations.   Review of Systems  Constitutional: Negative for chills, diaphoresis, fever, malaise/fatigue and weight loss.  HENT: Negative for nosebleeds and sore throat.   Eyes: Negative for double vision.  Respiratory: Negative for cough, hemoptysis, sputum production, shortness of breath and wheezing.   Cardiovascular: Positive for leg swelling. Negative for chest pain,  palpitations and orthopnea.  Gastrointestinal: Negative for blood in stool, constipation, diarrhea, heartburn, melena, nausea and vomiting.       Around parastomal hernia.  Genitourinary: Negative for dysuria, frequency and urgency.  Musculoskeletal: Negative for back pain and joint pain.  Skin: Negative for itching.  Neurological: Positive for tingling. Negative for dizziness, focal weakness, weakness and headaches.  Endo/Heme/Allergies: Bruises/bleeds easily.  Psychiatric/Behavioral: Negative for depression. The patient is nervous/anxious. The patient does not have insomnia.      MEDICAL HISTORY:  Past Medical History:  Diagnosis Date  . Breast cancer ([place]) 1985   bilateral mastectomies  . Depression    1 time specific situation that she did not know how to deal wiht  . Diverticulosis   . Hiatal hernia   . History of Bell's palsy   . History of colon polyps   . History of nephrolithiasis   . History of pancreatitis   . Hyperlipidemia   . IBS (irritable bowel syndrome)   . Osteoporosis    osteopenia in neck  . Skin cancer   . Temporal arteritis ([place])     SURGICAL HISTORY: Past Surgical History:  Procedure Laterality Date  . AUGMENTATION MAMMAPLASTY Bilateral 2000  . COLON RESECTION SIGMOID N/A 10/13/2017   Procedure: COLON RESECTION SIGMOID;  Surgeon: Herbert Pun, MD;  Location: [facility] ORS;  Service: General;  Laterality: N/A;  . COLOSTOMY N/A 10/13/2017   Procedure: COLOSTOMY;  Surgeon: Herbert Pun, MD;  Location: [facility] ORS;  Service: General;  Laterality: N/A;  . LAPAROSCOPIC CHOLECYSTECTOMY  2009  . MASTECTOMY  1983   bilateral    SOCIAL HISTORY: Social History   Socioeconomic History  . Marital status: Widowed    Spouse name: Not on file  . Number  of children: 2  . Years of education: college  . Highest education level: Not on file  Occupational History  . Occupation: Engineer, production  . Occupation: Retired  Tobacco Use  . Smoking status:  Former Smoker    Packs/day: 0.25    Years: 30.00    Pack years: 7.50    Types: Cigarettes    Quit date: 05/17/2005    Years since quitting: 14.5  . Smokeless tobacco: Never Used  . Tobacco comment: quit 11 years ago  Vaping Use  . Vaping Use: Never used  Substance and Sexual Activity  . Alcohol use: No    Alcohol/week: 0.0 standard drinks  . Drug use: No  . Sexual activity: Not Currently  Other Topics Concern  . Not on file  Social History Narrative   Patient lives at home alone.    Patient is retired.    Patient has some college.    Patient has 2 children.   Social Determinants of Health   Financial Resource Strain:   . Difficulty of Paying Living Expenses:   Food Insecurity:   . Worried About Charity fundraiser in the Last Year:   . Arboriculturist in the Last Year:   Transportation Needs:   . Film/video editor (Medical):   Marland Kitchen Lack of Transportation (Non-Medical):   Physical Activity:   . Days of Exercise per Week:   . Minutes of Exercise per Session:   Stress:   . Feeling of Stress :   Social Connections:   . Frequency of Communication with Friends and Family:   . Frequency of Social Gatherings with Friends and Family:   . Attends Religious Services:   . Active Member of Clubs or Organizations:   . Attends Archivist Meetings:   Marland Kitchen Marital Status:   Intimate Partner Violence:   . Fear of Current or Ex-Partner:   . Emotionally Abused:   Marland Kitchen Physically Abused:   . Sexually Abused:     FAMILY HISTORY: Family History  Problem Relation Age of Onset  . Colon cancer Mother 67  . Scleroderma Father 32  . Alcoholism Brother   . Stomach cancer Neg Hx     ALLERGIES:  is allergic to morphine, benzalkonium chloride, escitalopram, hydralazine, neomycin-bacitracin zn-polymyx, and bacitracin-neomycin-polymyxin.  MEDICATIONS:  Current Outpatient Medications  Medication Sig Dispense Refill  . ALPRAZolam (XANAX) 0.25 MG tablet Take 0.25 mg by mouth 2  (two) times daily as needed.     Marland Kitchen amLODipine (NORVASC) 5 MG tablet Take by mouth.    . Ascorbic Acid (VITAMIN C) 1000 MG tablet Take 1,000 mg by mouth daily.    Marland Kitchen aspirin EC 81 MG tablet Take 81 mg daily by mouth.    . Biotin 1000 MCG tablet Take 1,000 mcg by mouth daily.     . Cholecalciferol (VITAMIN D3) 2000 units capsule Take 2,000 Units daily by mouth.     . Cyanocobalamin (B-12 PO) Take 2,500 mcg daily by mouth.     Karle Barr (ZARXIO) 480 MCG/0.8ML SOSY injection Inject 0.8 mLs (480 mcg total) into the skin once a week. Administer injection subcutaneously 3.2 mL 6  . fluorouracil (EFUDEX) 5 % cream Apply 1 application topically 2 (two) times daily.    . Multiple Vitamins-Minerals (ZINC PO) Take by mouth.    . predniSONE (DELTASONE) 10 MG tablet Take 1 tablet (10 mg total) by mouth daily with breakfast. 30 tablet 0  . predniSONE (DELTASONE) 5 MG tablet Take 2.5  mg by mouth daily.     . traMADol (ULTRAM) 50 MG tablet Take 50 mg by mouth daily.      No current facility-administered medications for this visit.    PHYSICAL EXAMINATION: ECOG PERFORMANCE STATUS: 0 - Asymptomatic  Vitals:   12/09/19 1028  BP: (!) 165/60  Pulse: 74  Resp: 20  Temp: 98.4 F (36.9 C)  SpO2: 99%   Filed Weights   12/09/19 1028  Weight: 170 lb (77.1 kg)    Physical Exam Constitutional:      Comments: Alone.  HENT:     Head: Normocephalic and atraumatic.     Mouth/Throat:     Pharynx: No oropharyngeal exudate.  Eyes:     Pupils: Pupils are equal, round, and reactive to light.  Cardiovascular:     Rate and Rhythm: Normal rate and regular rhythm.  Pulmonary:     Effort: No respiratory distress.     Breath sounds: No wheezing.  Abdominal:     General: Bowel sounds are normal. There is no distension.     Palpations: Abdomen is soft. There is no mass.     Tenderness: There is no abdominal tenderness. There is no guarding or rebound.     Comments: Colostomy.  Parastomal hernia.   Musculoskeletal:        General: No tenderness. Normal range of motion.     Cervical back: Normal range of motion and neck supple.  Skin:    General: Skin is warm.     Comments: Hyperpigmented rash noted bilateral shins.  Neurological:     Mental Status: She is alert and oriented to person, place, and time.  Psychiatric:        Mood and Affect: Affect normal.     Comments: Anxious.      LABORATORY DATA:  I have reviewed the data as listed Lab Results  Component Value Date   WBC 2.9 (L) 12/09/2019   HGB 13.5 12/09/2019   HCT 42.2 12/09/2019   MCV 91.3 12/09/2019   PLT 195 12/09/2019   Recent Labs    04/01/19 0942 06/24/19 1006 09/24/19 1916  NA 140 141 139  K 4.5 3.9 4.1  CL 103 105 101  CO2 27 26 28   GLUCOSE 89 113* 91  BUN 19 16 19   CREATININE 0.89 0.90 1.03*  CALCIUM 9.2 9.3 9.3  GFRNONAA >60 60* 50*  GFRAA >60 >60 58*    RADIOGRAPHIC STUDIES: I have personally reviewed the radiological images as listed and agreed with the findings in the report. No results found.  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  Patient clinically asymptomatic.  However significantly concerned regarding continued ANC less than thousand.  # Prednisone 10 mg/day; improved; Will continue prednisone 5 mg/day [will refill]; discussed the potential long-term side effects of steroids including elevated blood pressure risk of infections/easy bruising etc.  # Body aches joint pains worse-question Zarxio vs others.  Improved as patient has been not needing growth factor.  # Hx of temporal arteritis->20 years on low dose prednisone-stable.  See above.   # Elevated blood pressure ; no history of [id number] systolic-likely situational/anxiety. STABLE;   Recommend close monitoring at home.  # Easy burising: sec to prednisone. Monitor closely.   # DISPOSITION: # HOLD  Zarxio # weekly cbc/zaxio x 4 # follow up with MD in 4  weeks- cbc/bmp-zaxio-Dr.B  All questions were answered.  The patient knows to call the clinic with any problems, questions or concerns.    Cammie Sickle, MD 12/09/2019 2:28 PM

## 2019-12-16 ENCOUNTER — Inpatient Hospital Stay: Payer: Medicare Other

## 2019-12-16 ENCOUNTER — Other Ambulatory Visit: Payer: Self-pay

## 2019-12-16 DIAGNOSIS — D708 Other neutropenia: Secondary | ICD-10-CM

## 2019-12-16 DIAGNOSIS — D702 Other drug-induced agranulocytosis: Secondary | ICD-10-CM | POA: Diagnosis not present

## 2019-12-16 DIAGNOSIS — D72819 Decreased white blood cell count, unspecified: Secondary | ICD-10-CM

## 2019-12-16 LAB — CBC WITH DIFFERENTIAL/PLATELET
Abs Immature Granulocytes: 0.1 10*3/uL — ABNORMAL HIGH (ref 0.00–0.07)
Basophils Absolute: 0 10*3/uL (ref 0.0–0.1)
Basophils Relative: 0 %
Eosinophils Absolute: 0 10*3/uL (ref 0.0–0.5)
Eosinophils Relative: 1 %
HCT: 41.6 % (ref 36.0–46.0)
Hemoglobin: 13.4 g/dL (ref 12.0–15.0)
Immature Granulocytes: 4 %
Lymphocytes Relative: 27 %
Lymphs Abs: 0.6 10*3/uL — ABNORMAL LOW (ref 0.7–4.0)
MCH: 29.6 pg (ref 26.0–34.0)
MCHC: 32.2 g/dL (ref 30.0–36.0)
MCV: 92 fL (ref 80.0–100.0)
Monocytes Absolute: 0.3 10*3/uL (ref 0.1–1.0)
Monocytes Relative: 11 %
Neutro Abs: 1.3 10*3/uL — ABNORMAL LOW (ref 1.7–7.7)
Neutrophils Relative %: 57 %
Platelets: 195 10*3/uL (ref 150–400)
RBC: 4.52 MIL/uL (ref 3.87–5.11)
RDW: 15.4 % (ref 11.5–15.5)
WBC: 2.3 10*3/uL — ABNORMAL LOW (ref 4.0–10.5)
nRBC: 0 % (ref 0.0–0.2)

## 2019-12-16 MED ORDER — FILGRASTIM-SNDZ 480 MCG/0.8ML IJ SOSY
480.0000 ug | PREFILLED_SYRINGE | Freq: Once | INTRAMUSCULAR | Status: AC
  Administered 2019-12-16: 480 ug via SUBCUTANEOUS
  Filled 2019-12-16: qty 0.8

## 2019-12-22 ENCOUNTER — Other Ambulatory Visit: Payer: Self-pay | Admitting: *Deleted

## 2019-12-22 DIAGNOSIS — D708 Other neutropenia: Secondary | ICD-10-CM

## 2019-12-22 DIAGNOSIS — D72819 Decreased white blood cell count, unspecified: Secondary | ICD-10-CM

## 2019-12-23 ENCOUNTER — Other Ambulatory Visit: Payer: Self-pay

## 2019-12-23 ENCOUNTER — Inpatient Hospital Stay: Payer: Medicare Other

## 2019-12-23 DIAGNOSIS — D708 Other neutropenia: Secondary | ICD-10-CM

## 2019-12-23 DIAGNOSIS — D72819 Decreased white blood cell count, unspecified: Secondary | ICD-10-CM

## 2019-12-23 DIAGNOSIS — D702 Other drug-induced agranulocytosis: Secondary | ICD-10-CM | POA: Diagnosis not present

## 2019-12-23 LAB — CBC WITH DIFFERENTIAL/PLATELET
Abs Immature Granulocytes: 0.06 10*3/uL (ref 0.00–0.07)
Basophils Absolute: 0 10*3/uL (ref 0.0–0.1)
Basophils Relative: 0 %
Eosinophils Absolute: 0 10*3/uL (ref 0.0–0.5)
Eosinophils Relative: 1 %
HCT: 44.5 % (ref 36.0–46.0)
Hemoglobin: 14.2 g/dL (ref 12.0–15.0)
Immature Granulocytes: 3 %
Lymphocytes Relative: 31 %
Lymphs Abs: 0.7 10*3/uL (ref 0.7–4.0)
MCH: 29.5 pg (ref 26.0–34.0)
MCHC: 31.9 g/dL (ref 30.0–36.0)
MCV: 92.3 fL (ref 80.0–100.0)
Monocytes Absolute: 0.2 10*3/uL (ref 0.1–1.0)
Monocytes Relative: 6 %
Neutro Abs: 1.4 10*3/uL — ABNORMAL LOW (ref 1.7–7.7)
Neutrophils Relative %: 59 %
Platelets: 206 10*3/uL (ref 150–400)
RBC: 4.82 MIL/uL (ref 3.87–5.11)
RDW: 15.3 % (ref 11.5–15.5)
WBC: 2.4 10*3/uL — ABNORMAL LOW (ref 4.0–10.5)
nRBC: 0 % (ref 0.0–0.2)

## 2019-12-23 MED ORDER — FILGRASTIM-SNDZ 480 MCG/0.8ML IJ SOSY
480.0000 ug | PREFILLED_SYRINGE | Freq: Once | INTRAMUSCULAR | Status: AC
  Administered 2019-12-23: 480 ug via SUBCUTANEOUS
  Filled 2019-12-23: qty 0.8

## 2019-12-30 ENCOUNTER — Inpatient Hospital Stay: Payer: Medicare Other

## 2019-12-30 ENCOUNTER — Other Ambulatory Visit: Payer: Self-pay

## 2019-12-30 ENCOUNTER — Inpatient Hospital Stay: Payer: Medicare Other | Attending: Internal Medicine

## 2019-12-30 DIAGNOSIS — D72819 Decreased white blood cell count, unspecified: Secondary | ICD-10-CM

## 2019-12-30 DIAGNOSIS — Z87891 Personal history of nicotine dependence: Secondary | ICD-10-CM | POA: Insufficient documentation

## 2019-12-30 DIAGNOSIS — R233 Spontaneous ecchymoses: Secondary | ICD-10-CM | POA: Diagnosis not present

## 2019-12-30 DIAGNOSIS — Z800 Family history of malignant neoplasm of digestive organs: Secondary | ICD-10-CM | POA: Insufficient documentation

## 2019-12-30 DIAGNOSIS — Z8719 Personal history of other diseases of the digestive system: Secondary | ICD-10-CM | POA: Insufficient documentation

## 2019-12-30 DIAGNOSIS — D708 Other neutropenia: Secondary | ICD-10-CM

## 2019-12-30 DIAGNOSIS — T380X5A Adverse effect of glucocorticoids and synthetic analogues, initial encounter: Secondary | ICD-10-CM | POA: Diagnosis not present

## 2019-12-30 DIAGNOSIS — Z853 Personal history of malignant neoplasm of breast: Secondary | ICD-10-CM | POA: Insufficient documentation

## 2019-12-30 DIAGNOSIS — R030 Elevated blood-pressure reading, without diagnosis of hypertension: Secondary | ICD-10-CM | POA: Diagnosis not present

## 2019-12-30 DIAGNOSIS — K435 Parastomal hernia without obstruction or  gangrene: Secondary | ICD-10-CM | POA: Diagnosis not present

## 2019-12-30 DIAGNOSIS — M359 Systemic involvement of connective tissue, unspecified: Secondary | ICD-10-CM | POA: Diagnosis not present

## 2019-12-30 DIAGNOSIS — Z9013 Acquired absence of bilateral breasts and nipples: Secondary | ICD-10-CM | POA: Insufficient documentation

## 2019-12-30 DIAGNOSIS — Z9049 Acquired absence of other specified parts of digestive tract: Secondary | ICD-10-CM | POA: Diagnosis not present

## 2019-12-30 DIAGNOSIS — Z87442 Personal history of urinary calculi: Secondary | ICD-10-CM | POA: Insufficient documentation

## 2019-12-30 DIAGNOSIS — Z885 Allergy status to narcotic agent status: Secondary | ICD-10-CM | POA: Diagnosis not present

## 2019-12-30 DIAGNOSIS — Z7952 Long term (current) use of systemic steroids: Secondary | ICD-10-CM | POA: Insufficient documentation

## 2019-12-30 DIAGNOSIS — Z79899 Other long term (current) drug therapy: Secondary | ICD-10-CM | POA: Insufficient documentation

## 2019-12-30 DIAGNOSIS — Z811 Family history of alcohol abuse and dependence: Secondary | ICD-10-CM | POA: Diagnosis not present

## 2019-12-30 DIAGNOSIS — Z85828 Personal history of other malignant neoplasm of skin: Secondary | ICD-10-CM | POA: Insufficient documentation

## 2019-12-30 DIAGNOSIS — Z8269 Family history of other diseases of the musculoskeletal system and connective tissue: Secondary | ICD-10-CM | POA: Diagnosis not present

## 2019-12-30 DIAGNOSIS — D709 Neutropenia, unspecified: Secondary | ICD-10-CM | POA: Insufficient documentation

## 2019-12-30 DIAGNOSIS — M8580 Other specified disorders of bone density and structure, unspecified site: Secondary | ICD-10-CM | POA: Insufficient documentation

## 2019-12-30 LAB — CBC WITH DIFFERENTIAL/PLATELET
Abs Immature Granulocytes: 0.16 10*3/uL — ABNORMAL HIGH (ref 0.00–0.07)
Basophils Absolute: 0 10*3/uL (ref 0.0–0.1)
Basophils Relative: 1 %
Eosinophils Absolute: 0 10*3/uL (ref 0.0–0.5)
Eosinophils Relative: 1 %
HCT: 44 % (ref 36.0–46.0)
Hemoglobin: 14.1 g/dL (ref 12.0–15.0)
Immature Granulocytes: 7 %
Lymphocytes Relative: 28 %
Lymphs Abs: 0.6 10*3/uL — ABNORMAL LOW (ref 0.7–4.0)
MCH: 29.7 pg (ref 26.0–34.0)
MCHC: 32 g/dL (ref 30.0–36.0)
MCV: 92.6 fL (ref 80.0–100.0)
Monocytes Absolute: 0.3 10*3/uL (ref 0.1–1.0)
Monocytes Relative: 12 %
Neutro Abs: 1.1 10*3/uL — ABNORMAL LOW (ref 1.7–7.7)
Neutrophils Relative %: 51 %
Platelets: 188 10*3/uL (ref 150–400)
RBC: 4.75 MIL/uL (ref 3.87–5.11)
RDW: 15 % (ref 11.5–15.5)
Smear Review: ADEQUATE
WBC: 2.2 10*3/uL — ABNORMAL LOW (ref 4.0–10.5)
nRBC: 0 % (ref 0.0–0.2)

## 2019-12-30 MED ORDER — FILGRASTIM-SNDZ 480 MCG/0.8ML IJ SOSY
480.0000 ug | PREFILLED_SYRINGE | Freq: Once | INTRAMUSCULAR | Status: AC
  Administered 2019-12-30: 480 ug via SUBCUTANEOUS
  Filled 2019-12-30: qty 0.8

## 2020-01-06 ENCOUNTER — Encounter: Payer: Self-pay | Admitting: Internal Medicine

## 2020-01-06 ENCOUNTER — Inpatient Hospital Stay (HOSPITAL_BASED_OUTPATIENT_CLINIC_OR_DEPARTMENT_OTHER): Payer: Medicare Other | Admitting: Internal Medicine

## 2020-01-06 ENCOUNTER — Inpatient Hospital Stay: Payer: Medicare Other

## 2020-01-06 ENCOUNTER — Other Ambulatory Visit: Payer: Self-pay

## 2020-01-06 DIAGNOSIS — D72819 Decreased white blood cell count, unspecified: Secondary | ICD-10-CM

## 2020-01-06 DIAGNOSIS — D708 Other neutropenia: Secondary | ICD-10-CM

## 2020-01-06 LAB — BASIC METABOLIC PANEL
Anion gap: 11 (ref 5–15)
BUN: 19 mg/dL (ref 8–23)
CO2: 28 mmol/L (ref 22–32)
Calcium: 8.8 mg/dL — ABNORMAL LOW (ref 8.9–10.3)
Chloride: 101 mmol/L (ref 98–111)
Creatinine, Ser: 0.89 mg/dL (ref 0.44–1.00)
GFR calc Af Amer: >60 mL/min (ref >60)
GFR calc non Af Amer: 60 mL/min — ABNORMAL LOW (ref >60)
Glucose, Bld: 107 mg/dL — ABNORMAL HIGH (ref 70–99)
Potassium: 4.2 mmol/L (ref 3.5–5.1)
Sodium: 140 mmol/L (ref 135–145)

## 2020-01-06 LAB — CBC WITH DIFFERENTIAL/PLATELET
Abs Immature Granulocytes: 0 10*3/uL (ref 0.00–0.07)
Basophils Absolute: 0 10*3/uL (ref 0.0–0.1)
Basophils Relative: 0 %
Eosinophils Absolute: 0 10*3/uL (ref 0.0–0.5)
Eosinophils Relative: 0 %
HCT: 43.7 % (ref 36.0–46.0)
Hemoglobin: 14 g/dL (ref 12.0–15.0)
Immature Granulocytes: 0 %
Lymphocytes Relative: 29 %
Lymphs Abs: 0.7 10*3/uL (ref 0.7–4.0)
MCH: 29.5 pg (ref 26.0–34.0)
MCHC: 32 g/dL (ref 30.0–36.0)
MCV: 92.2 fL (ref 80.0–100.0)
Monocytes Absolute: 0.2 10*3/uL (ref 0.1–1.0)
Monocytes Relative: 10 %
Neutro Abs: 1.5 10*3/uL — ABNORMAL LOW (ref 1.7–7.7)
Neutrophils Relative %: 61 %
Platelets: 207 10*3/uL (ref 150–400)
RBC: 4.74 MIL/uL (ref 3.87–5.11)
RDW: 14.7 % (ref 11.5–15.5)
Smear Review: NORMAL
WBC: 2.5 10*3/uL — ABNORMAL LOW (ref 4.0–10.5)
nRBC: 0 % (ref 0.0–0.2)

## 2020-01-06 NOTE — Assessment & Plan Note (Addendum)
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  Patient clinically asymptomatic.    # Prednisone 5 mg/day [will fill]; continue at current dose.  Prednisone 5 mg seems to be helping.  Neutropenia.  # Body aches joint pains--question Zarxio vs others-STABLE.     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   # Elevated blood pressure ; no history of [id number] systolic-likely situational/anxiety. STABLE.   # Easy burising: sec to prednisone. STABLE;   # DISPOSITION: # HOLD ZARXIO today # weekly cbc/zaxio x 6 # follow up with MD in 6  weeks- cbc/bmp-zaxio-Dr.B

## 2020-01-06 NOTE — Progress Notes (Signed)
[place] NOTE  Patient Care Team: Baxter Hire, MD as PCP - General (Internal Medicine) Emmaline Kluver., MD (Rheumatology) Dasher, Rayvon Char, MD (Dermatology) Cammie Sickle, MD as Consulting Physician (Hematology and Oncology)  CHIEF COMPLAINTS/PURPOSE OF CONSULTATION: Autoimmune neutropenia  #April 2019- Autoimmune neutropenia-weekly Granix/CBC [Dr.Crocoran]; BMBx- no malignancy [Bone marrow aspirate and biopsy on 09/04/2017 revealed a hypercellular marrow for age with trilineage hematopoiesis.  There was abundant mature neutrophils.  Significant dyspoiesis or increased blasts was not identified.  Flow cytometry revealed a predominance of T lymphocytes (16% of all cells) with no abnormal phenotype.  There was a minor B-cell population (13% of lymphocytes) with slight kappa light chain excess.  Cytogenetics revealed 37, XX, del(20)(q11.2)[2] / 46,XX[18].]; weekly cbc/granix [until July 2020]; Aug 2020- Zarxio;  June 2021-prednisone 10 mg a day; response noted' Ju;y 13th --prednisone 5 mg a day  #   temporal arteritis [2003] chronic prednisone/ 2.5 mg a day; Dr.Kernodle.  Oncology History   No history exists.    HISTORY OF PRESENTING ILLNESS:  [name] 85 y.o.  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  Patient continues to complain of easy bruising.  Otherwise no fever no chills.  Wants to have sinus drainage today.   Denies having any recent infections; need for antibiotics.  Review of Systems  Constitutional: Negative for chills, diaphoresis, fever, malaise/fatigue and weight loss.  HENT: Negative for nosebleeds and sore throat.   Eyes: Negative for double vision.  Respiratory: Negative for cough, hemoptysis, sputum production, shortness of breath and wheezing.   Cardiovascular: Positive for leg swelling. Negative for chest pain, palpitations and orthopnea.   Gastrointestinal: Negative for blood in stool, constipation, diarrhea, heartburn, melena, nausea and vomiting.       Around parastomal hernia.  Genitourinary: Negative for dysuria, frequency and urgency.  Musculoskeletal: Negative for back pain and joint pain.  Skin: Negative for itching.  Neurological: Positive for tingling. Negative for dizziness, focal weakness, weakness and headaches.  Endo/Heme/Allergies: Bruises/bleeds easily.  Psychiatric/Behavioral: Negative for depression. The patient is nervous/anxious. The patient does not have insomnia.      MEDICAL HISTORY:  Past Medical History:  Diagnosis Date  . Breast cancer ([place]) 1985   bilateral mastectomies  . Depression    1 time specific situation that [name] did not know how to deal wiht  . Diverticulosis   . Hiatal hernia   . History of Bell's palsy   . History of colon polyps   . History of nephrolithiasis   . History of pancreatitis   . Hyperlipidemia   . IBS (irritable bowel syndrome)   . Osteoporosis    osteopenia in neck  . Skin cancer   . Temporal arteritis ([place])     SURGICAL HISTORY: Past Surgical History:  Procedure Laterality Date  . AUGMENTATION MAMMAPLASTY Bilateral 2000  . COLON RESECTION SIGMOID N/A 10/13/2017   Procedure: COLON RESECTION SIGMOID;  Surgeon: Herbert Pun, MD;  Location: [facility] ORS;  Service: General;  Laterality: N/A;  . COLOSTOMY N/A 10/13/2017   Procedure: COLOSTOMY;  Surgeon: Herbert Pun, MD;  Location: [facility] ORS;  Service: General;  Laterality: N/A;  . LAPAROSCOPIC CHOLECYSTECTOMY  2009  . MASTECTOMY  1983   bilateral    SOCIAL HISTORY: Social History   Socioeconomic History  . Marital status: Widowed    Spouse name: Not on file  . Number of children: 2  . Years of education: college  .  Highest education level: Not on file  Occupational History  . Occupation: Engineer, production  . Occupation: Retired  Tobacco Use  . Smoking status: Former Smoker    Packs/day: 0.25     Years: 30.00    Pack years: 7.50    Types: Cigarettes    Quit date: 05/17/2005    Years since quitting: 14.6  . Smokeless tobacco: Never Used  . Tobacco comment: quit 11 years ago  Vaping Use  . Vaping Use: Never used  Substance and Sexual Activity  . Alcohol use: No    Alcohol/week: 0.0 standard drinks  . Drug use: No  . Sexual activity: Not Currently  Other Topics Concern  . Not on file  Social History Narrative   Patient lives at home alone.    Patient is retired.    Patient has some college.    Patient has 2 children.   Social Determinants of Health   Financial Resource Strain:   . Difficulty of Paying Living Expenses:   Food Insecurity:   . Worried About Charity fundraiser in the Last Year:   . Arboriculturist in the Last Year:   Transportation Needs:   . Film/video editor (Medical):   Marland Kitchen Lack of Transportation (Non-Medical):   Physical Activity:   . Days of Exercise per Week:   . Minutes of Exercise per Session:   Stress:   . Feeling of Stress :   Social Connections:   . Frequency of Communication with Friends and Family:   . Frequency of Social Gatherings with Friends and Family:   . Attends Religious Services:   . Active Member of Clubs or Organizations:   . Attends Archivist Meetings:   Marland Kitchen Marital Status:   Intimate Partner Violence:   . Fear of Current or Ex-Partner:   . Emotionally Abused:   Marland Kitchen Physically Abused:   . Sexually Abused:     FAMILY HISTORY: Family History  Problem Relation Age of Onset  . Colon cancer Mother 80  . Scleroderma Father 11  . Alcoholism Brother   . Stomach cancer Neg Hx     ALLERGIES:  is allergic to morphine, benzalkonium chloride, escitalopram, hydralazine, neomycin-bacitracin zn-polymyx, and bacitracin-neomycin-polymyxin.  MEDICATIONS:  Current Outpatient Medications  Medication Sig Dispense Refill  . ALPRAZolam (XANAX) 0.25 MG tablet Take 0.25 mg by mouth 2 (two) times daily as needed.     Marland Kitchen  amLODipine (NORVASC) 5 MG tablet Take by mouth.    . Ascorbic Acid (VITAMIN C) 1000 MG tablet Take 1,000 mg by mouth daily.    Marland Kitchen aspirin EC 81 MG tablet Take 81 mg daily by mouth.    . Biotin 1000 MCG tablet Take 1,000 mcg by mouth daily.     . Cholecalciferol (VITAMIN D3) 2000 units capsule Take 2,000 Units daily by mouth.     . Cyanocobalamin (B-12 PO) Take 2,500 mcg daily by mouth.     Karle Barr (ZARXIO) 480 MCG/0.8ML SOSY injection Inject 0.8 mLs (480 mcg total) into the skin once a week. Administer injection subcutaneously 3.2 mL 6  . fluorouracil (EFUDEX) 5 % cream Apply 1 application topically 2 (two) times daily.    . Multiple Vitamins-Minerals (ZINC PO) Take by mouth.    . predniSONE (DELTASONE) 10 MG tablet Take 1 tablet (10 mg total) by mouth daily with breakfast. 30 tablet 0  . predniSONE (DELTASONE) 5 MG tablet Take 2.5 mg by mouth daily.     . traMADol Veatrice Bourbon)  50 MG tablet Take 50 mg by mouth daily.      No current facility-administered medications for this visit.    PHYSICAL EXAMINATION: ECOG PERFORMANCE STATUS: 0 - Asymptomatic  Vitals:   01/06/20 0953  BP: (!) 167/65  Pulse: 72  Resp: 16  Temp: (!) 97.2 F (36.2 C)  SpO2: 100%   Filed Weights   01/06/20 0953  Weight: 173 lb 6.4 oz (78.7 kg)    Physical Exam Constitutional:      Comments: Alone.  HENT:     Head: Normocephalic and atraumatic.     Mouth/Throat:     Pharynx: No oropharyngeal exudate.  Eyes:     Pupils: Pupils are equal, round, and reactive to light.  Cardiovascular:     Rate and Rhythm: Normal rate and regular rhythm.  Pulmonary:     Effort: No respiratory distress.     Breath sounds: No wheezing.  Abdominal:     General: Bowel sounds are normal. There is no distension.     Palpations: Abdomen is soft. There is no mass.     Tenderness: There is no abdominal tenderness. There is no guarding or rebound.     Comments: Colostomy.  Parastomal hernia.  Musculoskeletal:         General: No tenderness. Normal range of motion.     Cervical back: Normal range of motion and neck supple.  Skin:    General: Skin is warm.     Comments: Hyperpigmented rash noted bilateral shins.  Neurological:     Mental Status: [name] is alert and oriented to person, place, and time.  Psychiatric:        Mood and Affect: Affect normal.     Comments: Anxious.      LABORATORY DATA:  I have reviewed the data as listed Lab Results  Component Value Date   WBC 2.5 (L) 01/06/2020   HGB 14.0 01/06/2020   HCT 43.7 01/06/2020   MCV 92.2 01/06/2020   PLT 207 01/06/2020   Recent Labs    06/24/19 1006 09/24/19 1916 01/06/20 0936  NA 141 139 140  K 3.9 4.1 4.2  CL 105 101 101  CO2 26 28 28   GLUCOSE 113* 91 107*  BUN 16 19 19   CREATININE 0.90 1.03* 0.89  CALCIUM 9.3 9.3 8.8*  GFRNONAA 60* 50* 60*  GFRAA >60 58* >60    RADIOGRAPHIC STUDIES: I have personally reviewed the radiological images as listed and agreed with the findings in the report. No results found.  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  Patient clinically asymptomatic.    # Prednisone 5 mg/day [will fill]; continue at current dose.  Prednisone 5 mg seems to be helping.  Neutropenia.  # Body aches joint pains--question Zarxio vs others-STABLE.     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   # Elevated blood pressure ; no history of [id number] systolic-likely situational/anxiety. STABLE.   # Easy burising: sec to prednisone. STABLE;   # DISPOSITION: # HOLD ZARXIO today # weekly cbc/zaxio x 6 # follow up with MD in 6  weeks- cbc/bmp-zaxio-Dr.B  All questions were answered. The patient knows to call the clinic with any problems, questions or concerns.    Cammie Sickle, MD 01/06/2020 11:18 AM

## 2020-01-13 ENCOUNTER — Inpatient Hospital Stay: Payer: Medicare Other

## 2020-01-13 ENCOUNTER — Other Ambulatory Visit: Payer: Self-pay

## 2020-01-13 DIAGNOSIS — D708 Other neutropenia: Secondary | ICD-10-CM

## 2020-01-13 DIAGNOSIS — D72819 Decreased white blood cell count, unspecified: Secondary | ICD-10-CM

## 2020-01-13 LAB — CBC WITH DIFFERENTIAL/PLATELET
Abs Immature Granulocytes: 0.02 10*3/uL (ref 0.00–0.07)
Basophils Absolute: 0 10*3/uL (ref 0.0–0.1)
Basophils Relative: 0 %
Eosinophils Absolute: 0 10*3/uL (ref 0.0–0.5)
Eosinophils Relative: 1 %
HCT: 44.9 % (ref 36.0–46.0)
Hemoglobin: 14.2 g/dL (ref 12.0–15.0)
Immature Granulocytes: 1 %
Lymphocytes Relative: 27 %
Lymphs Abs: 0.8 10*3/uL (ref 0.7–4.0)
MCH: 29.2 pg (ref 26.0–34.0)
MCHC: 31.6 g/dL (ref 30.0–36.0)
MCV: 92.4 fL (ref 80.0–100.0)
Monocytes Absolute: 0.3 10*3/uL (ref 0.1–1.0)
Monocytes Relative: 10 %
Neutro Abs: 1.8 10*3/uL (ref 1.7–7.7)
Neutrophils Relative %: 61 %
Platelets: 225 10*3/uL (ref 150–400)
RBC: 4.86 MIL/uL (ref 3.87–5.11)
RDW: 14.6 % (ref 11.5–15.5)
WBC: 2.9 10*3/uL — ABNORMAL LOW (ref 4.0–10.5)
nRBC: 0 % (ref 0.0–0.2)

## 2020-01-20 ENCOUNTER — Inpatient Hospital Stay: Payer: Medicare Other

## 2020-01-20 ENCOUNTER — Other Ambulatory Visit: Payer: Self-pay

## 2020-01-20 DIAGNOSIS — D708 Other neutropenia: Secondary | ICD-10-CM

## 2020-01-20 DIAGNOSIS — D72819 Decreased white blood cell count, unspecified: Secondary | ICD-10-CM

## 2020-01-20 LAB — CBC WITH DIFFERENTIAL/PLATELET
Abs Immature Granulocytes: 0.02 10*3/uL (ref 0.00–0.07)
Basophils Absolute: 0 10*3/uL (ref 0.0–0.1)
Basophils Relative: 1 %
Eosinophils Absolute: 0 10*3/uL (ref 0.0–0.5)
Eosinophils Relative: 1 %
HCT: 43.3 % (ref 36.0–46.0)
Hemoglobin: 13.9 g/dL (ref 12.0–15.0)
Immature Granulocytes: 1 %
Lymphocytes Relative: 54 %
Lymphs Abs: 1.1 10*3/uL (ref 0.7–4.0)
MCH: 29.6 pg (ref 26.0–34.0)
MCHC: 32.1 g/dL (ref 30.0–36.0)
MCV: 92.3 fL (ref 80.0–100.0)
Monocytes Absolute: 0.2 10*3/uL (ref 0.1–1.0)
Monocytes Relative: 11 %
Neutro Abs: 0.7 10*3/uL — ABNORMAL LOW (ref 1.7–7.7)
Neutrophils Relative %: 32 %
Platelets: 209 10*3/uL (ref 150–400)
RBC: 4.69 MIL/uL (ref 3.87–5.11)
RDW: 14.6 % (ref 11.5–15.5)
WBC: 2.1 10*3/uL — ABNORMAL LOW (ref 4.0–10.5)
nRBC: 0 % (ref 0.0–0.2)

## 2020-01-20 MED ORDER — FILGRASTIM-SNDZ 480 MCG/0.8ML IJ SOSY
480.0000 ug | PREFILLED_SYRINGE | Freq: Once | INTRAMUSCULAR | Status: AC
  Administered 2020-01-20: 480 ug via SUBCUTANEOUS
  Filled 2020-01-20: qty 0.8

## 2020-01-27 ENCOUNTER — Other Ambulatory Visit: Payer: Self-pay

## 2020-01-27 ENCOUNTER — Inpatient Hospital Stay: Payer: Medicare Other

## 2020-01-27 DIAGNOSIS — D708 Other neutropenia: Secondary | ICD-10-CM | POA: Diagnosis not present

## 2020-01-27 LAB — CBC WITH DIFFERENTIAL/PLATELET
Abs Immature Granulocytes: 0 10*3/uL (ref 0.00–0.07)
Basophils Absolute: 0 10*3/uL (ref 0.0–0.1)
Basophils Relative: 0 %
Eosinophils Absolute: 0 10*3/uL (ref 0.0–0.5)
Eosinophils Relative: 1 %
HCT: 44.6 % (ref 36.0–46.0)
Hemoglobin: 14.4 g/dL (ref 12.0–15.0)
Immature Granulocytes: 0 %
Lymphocytes Relative: 32 %
Lymphs Abs: 0.8 10*3/uL (ref 0.7–4.0)
MCH: 29.6 pg (ref 26.0–34.0)
MCHC: 32.3 g/dL (ref 30.0–36.0)
MCV: 91.6 fL (ref 80.0–100.0)
Monocytes Absolute: 0.2 10*3/uL (ref 0.1–1.0)
Monocytes Relative: 7 %
Neutro Abs: 1.6 10*3/uL — ABNORMAL LOW (ref 1.7–7.7)
Neutrophils Relative %: 60 %
Platelets: 176 10*3/uL (ref 150–400)
RBC: 4.87 MIL/uL (ref 3.87–5.11)
RDW: 14.7 % (ref 11.5–15.5)
Smear Review: NORMAL
WBC: 2.6 10*3/uL — ABNORMAL LOW (ref 4.0–10.5)
nRBC: 0 % (ref 0.0–0.2)

## 2020-02-03 ENCOUNTER — Inpatient Hospital Stay: Payer: Medicare Other | Attending: Internal Medicine

## 2020-02-03 ENCOUNTER — Other Ambulatory Visit: Payer: Self-pay

## 2020-02-03 ENCOUNTER — Inpatient Hospital Stay: Payer: Medicare Other

## 2020-02-03 DIAGNOSIS — Z87442 Personal history of urinary calculi: Secondary | ICD-10-CM | POA: Diagnosis not present

## 2020-02-03 DIAGNOSIS — D84821 Immunodeficiency due to drugs: Secondary | ICD-10-CM | POA: Diagnosis not present

## 2020-02-03 DIAGNOSIS — Z87891 Personal history of nicotine dependence: Secondary | ICD-10-CM | POA: Insufficient documentation

## 2020-02-03 DIAGNOSIS — Z8719 Personal history of other diseases of the digestive system: Secondary | ICD-10-CM | POA: Insufficient documentation

## 2020-02-03 DIAGNOSIS — Z800 Family history of malignant neoplasm of digestive organs: Secondary | ICD-10-CM | POA: Diagnosis not present

## 2020-02-03 DIAGNOSIS — Z885 Allergy status to narcotic agent status: Secondary | ICD-10-CM | POA: Insufficient documentation

## 2020-02-03 DIAGNOSIS — Z9049 Acquired absence of other specified parts of digestive tract: Secondary | ICD-10-CM | POA: Diagnosis not present

## 2020-02-03 DIAGNOSIS — Z6372 Alcoholism and drug addiction in family: Secondary | ICD-10-CM | POA: Insufficient documentation

## 2020-02-03 DIAGNOSIS — Z85828 Personal history of other malignant neoplasm of skin: Secondary | ICD-10-CM | POA: Insufficient documentation

## 2020-02-03 DIAGNOSIS — Z8269 Family history of other diseases of the musculoskeletal system and connective tissue: Secondary | ICD-10-CM | POA: Diagnosis not present

## 2020-02-03 DIAGNOSIS — Z9013 Acquired absence of bilateral breasts and nipples: Secondary | ICD-10-CM | POA: Insufficient documentation

## 2020-02-03 DIAGNOSIS — G720 Drug-induced myopathy: Secondary | ICD-10-CM | POA: Diagnosis not present

## 2020-02-03 DIAGNOSIS — R531 Weakness: Secondary | ICD-10-CM | POA: Diagnosis not present

## 2020-02-03 DIAGNOSIS — R030 Elevated blood-pressure reading, without diagnosis of hypertension: Secondary | ICD-10-CM | POA: Diagnosis not present

## 2020-02-03 DIAGNOSIS — T380X5A Adverse effect of glucocorticoids and synthetic analogues, initial encounter: Secondary | ICD-10-CM | POA: Insufficient documentation

## 2020-02-03 DIAGNOSIS — D8489 Other immunodeficiencies: Secondary | ICD-10-CM | POA: Insufficient documentation

## 2020-02-03 DIAGNOSIS — M7989 Other specified soft tissue disorders: Secondary | ICD-10-CM | POA: Insufficient documentation

## 2020-02-03 DIAGNOSIS — Z79899 Other long term (current) drug therapy: Secondary | ICD-10-CM | POA: Diagnosis not present

## 2020-02-03 DIAGNOSIS — Z853 Personal history of malignant neoplasm of breast: Secondary | ICD-10-CM | POA: Diagnosis not present

## 2020-02-03 DIAGNOSIS — D708 Other neutropenia: Secondary | ICD-10-CM | POA: Insufficient documentation

## 2020-02-03 LAB — CBC WITH DIFFERENTIAL/PLATELET
Abs Immature Granulocytes: 0.14 10*3/uL — ABNORMAL HIGH (ref 0.00–0.07)
Basophils Absolute: 0 10*3/uL (ref 0.0–0.1)
Basophils Relative: 1 %
Eosinophils Absolute: 0 10*3/uL (ref 0.0–0.5)
Eosinophils Relative: 1 %
HCT: 44.7 % (ref 36.0–46.0)
Hemoglobin: 14.3 g/dL (ref 12.0–15.0)
Immature Granulocytes: 4 %
Lymphocytes Relative: 26 %
Lymphs Abs: 0.9 10*3/uL (ref 0.7–4.0)
MCH: 29.5 pg (ref 26.0–34.0)
MCHC: 32 g/dL (ref 30.0–36.0)
MCV: 92.2 fL (ref 80.0–100.0)
Monocytes Absolute: 0.3 10*3/uL (ref 0.1–1.0)
Monocytes Relative: 8 %
Neutro Abs: 2.1 10*3/uL (ref 1.7–7.7)
Neutrophils Relative %: 60 %
Platelets: 190 10*3/uL (ref 150–400)
RBC: 4.85 MIL/uL (ref 3.87–5.11)
RDW: 14.8 % (ref 11.5–15.5)
WBC: 3.4 10*3/uL — ABNORMAL LOW (ref 4.0–10.5)
nRBC: 0 % (ref 0.0–0.2)

## 2020-02-10 ENCOUNTER — Other Ambulatory Visit: Payer: Self-pay

## 2020-02-10 ENCOUNTER — Inpatient Hospital Stay: Payer: Medicare Other

## 2020-02-10 DIAGNOSIS — D72819 Decreased white blood cell count, unspecified: Secondary | ICD-10-CM

## 2020-02-10 DIAGNOSIS — D708 Other neutropenia: Secondary | ICD-10-CM | POA: Diagnosis not present

## 2020-02-10 LAB — CBC WITH DIFFERENTIAL/PLATELET
Abs Immature Granulocytes: 0.02 10*3/uL (ref 0.00–0.07)
Basophils Absolute: 0 10*3/uL (ref 0.0–0.1)
Basophils Relative: 1 %
Eosinophils Absolute: 0.1 10*3/uL (ref 0.0–0.5)
Eosinophils Relative: 2 %
HCT: 43.2 % (ref 36.0–46.0)
Hemoglobin: 13.8 g/dL (ref 12.0–15.0)
Immature Granulocytes: 1 %
Lymphocytes Relative: 58 %
Lymphs Abs: 1.3 10*3/uL (ref 0.7–4.0)
MCH: 29.7 pg (ref 26.0–34.0)
MCHC: 31.9 g/dL (ref 30.0–36.0)
MCV: 93.1 fL (ref 80.0–100.0)
Monocytes Absolute: 0.3 10*3/uL (ref 0.1–1.0)
Monocytes Relative: 12 %
Neutro Abs: 0.6 10*3/uL — ABNORMAL LOW (ref 1.7–7.7)
Neutrophils Relative %: 26 %
Platelets: 197 10*3/uL (ref 150–400)
RBC: 4.64 MIL/uL (ref 3.87–5.11)
RDW: 14.8 % (ref 11.5–15.5)
WBC: 2.2 10*3/uL — ABNORMAL LOW (ref 4.0–10.5)
nRBC: 0 % (ref 0.0–0.2)

## 2020-02-10 MED ORDER — FILGRASTIM-SNDZ 480 MCG/0.8ML IJ SOSY
480.0000 ug | PREFILLED_SYRINGE | Freq: Once | INTRAMUSCULAR | Status: AC
  Administered 2020-02-10: 480 ug via SUBCUTANEOUS
  Filled 2020-02-10: qty 0.8

## 2020-02-15 IMAGING — CT CT ABD-PELV W/ CM
2 of 5 series · 16 of 46 positions shown, 18 images · IV contrast (iopamidol)
Comparison: 06/14/2017

CLINICAL DATA: Diverticulitis

EXAM:
CT ABDOMEN AND PELVIS WITH CONTRAST
TECHNIQUE: Multidetector CT imaging of the abdomen and pelvis was performed
using the standard protocol following bolus administration of
intravenous contrast.
CONTRAST:  100mL 2QQHR9-6ZZ IOPAMIDOL (2QQHR9-6ZZ) INJECTION 61%

[Series 2: axial st · axial · 0.76mm/px · z∈[-483,-98]mm · 13 of 87 slices shown, 15 images]
[im 5/87  soft-tissue]
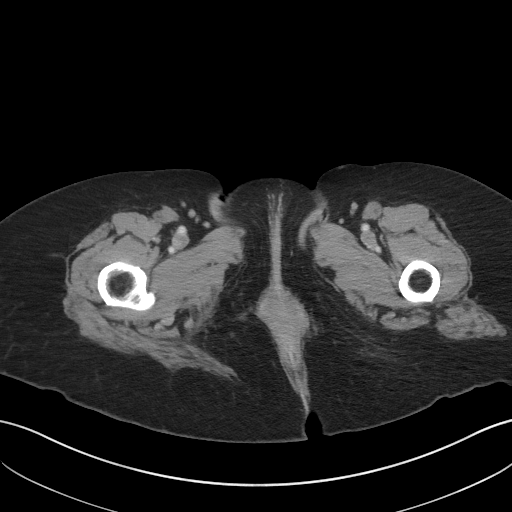
[im 5/87  bone]
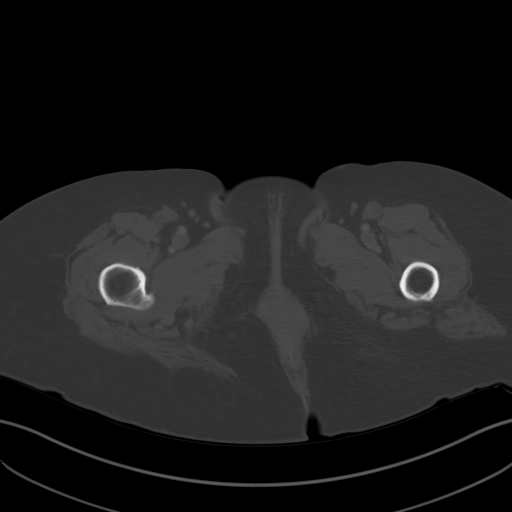
[im 14/87  soft-tissue]
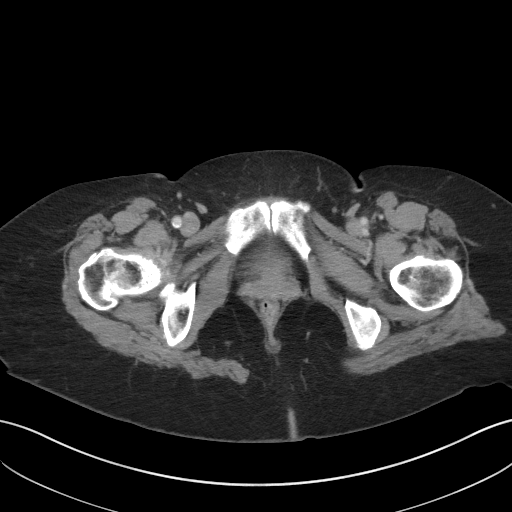
[im 19/87  soft-tissue]
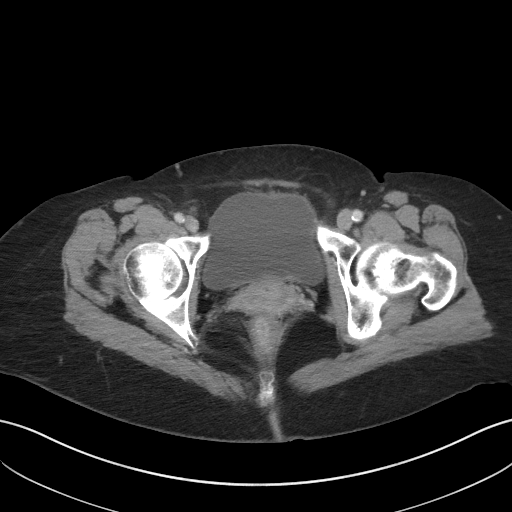
[im 23/87  soft-tissue]
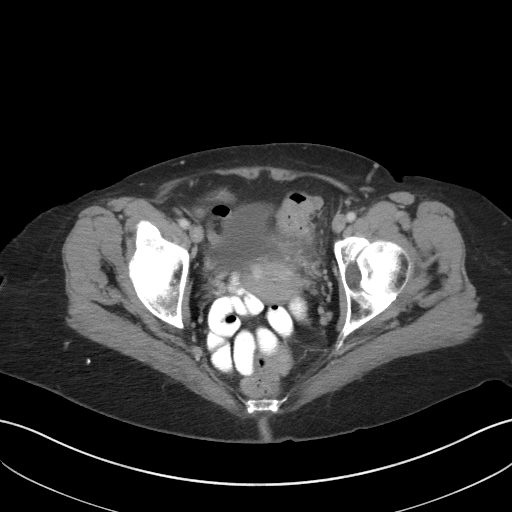
[im 32/87  soft-tissue]
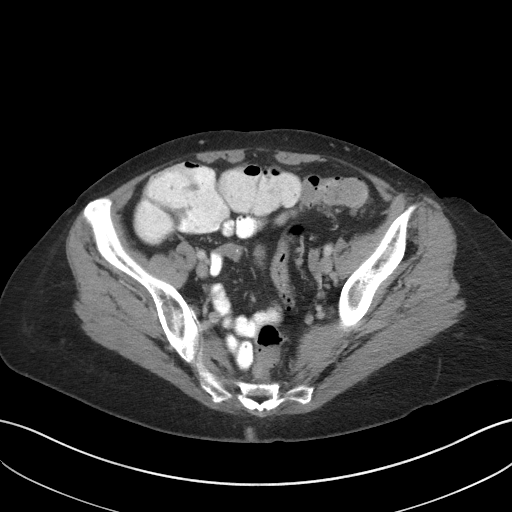
[im 37/87  soft-tissue]
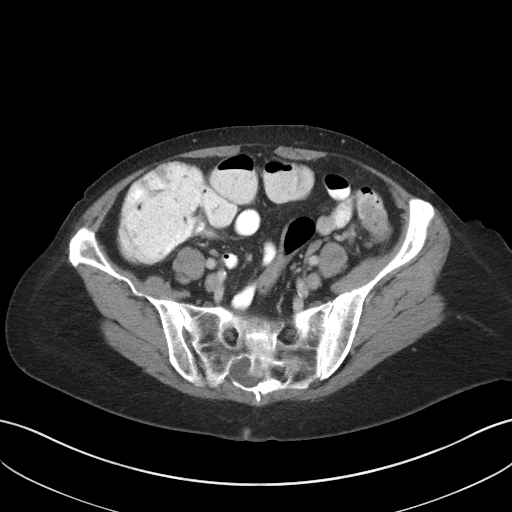
[im 46/87  soft-tissue]
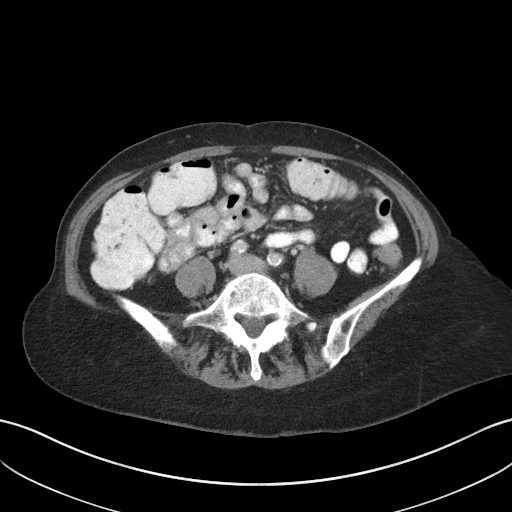
[im 50/87  soft-tissue]
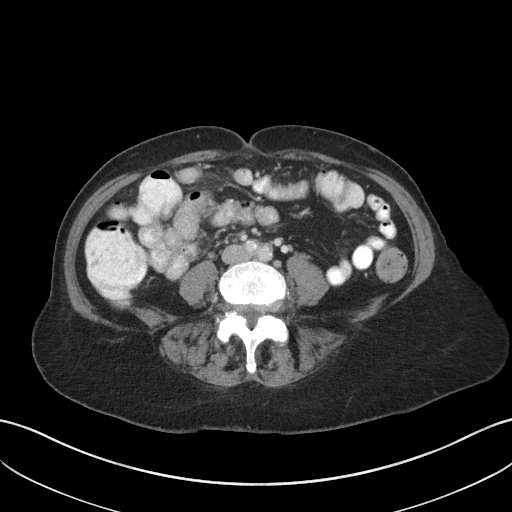
[im 55/87  soft-tissue]
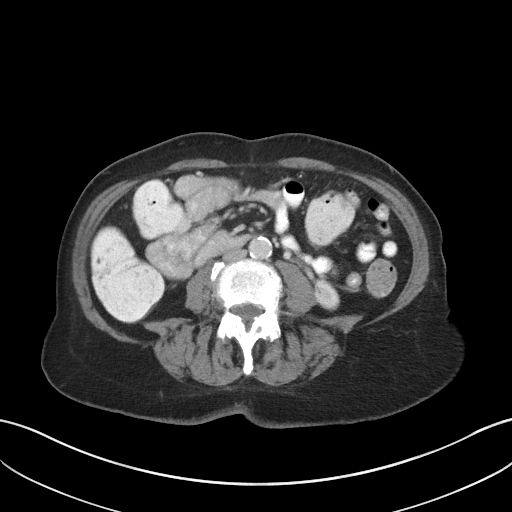
[im 55/87  bone]
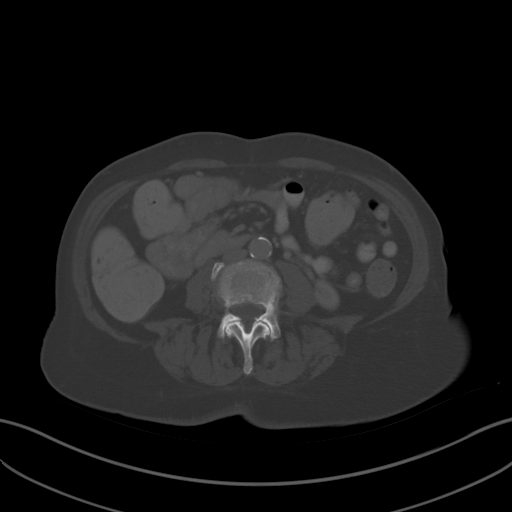
[im 64/87  soft-tissue]
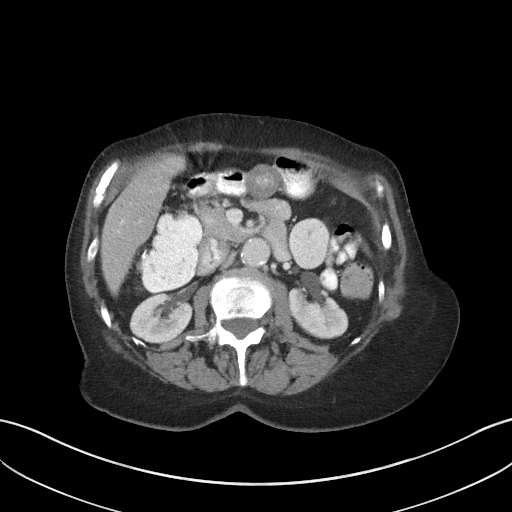
[im 68/87  soft-tissue]
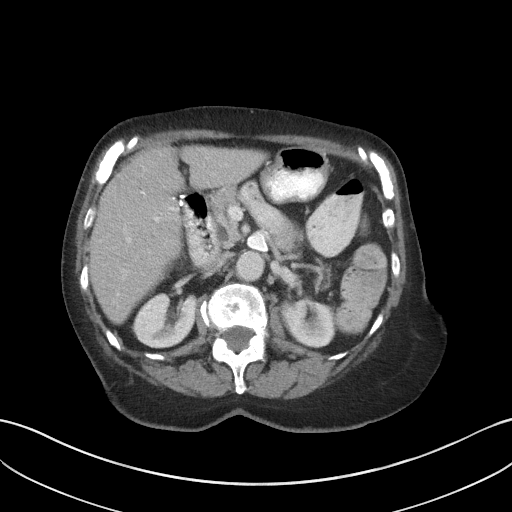
[im 73/87  soft-tissue]
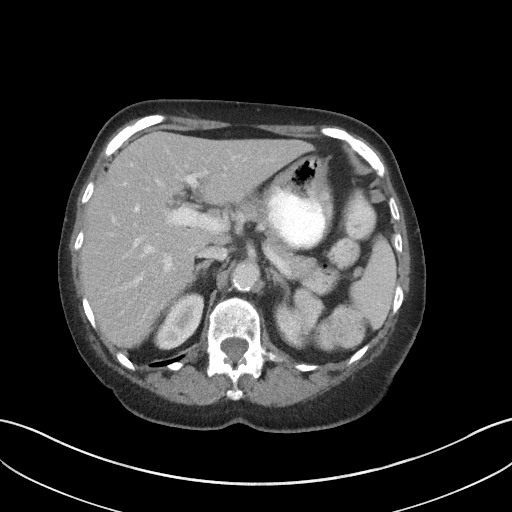
[im 82/87  soft-tissue]
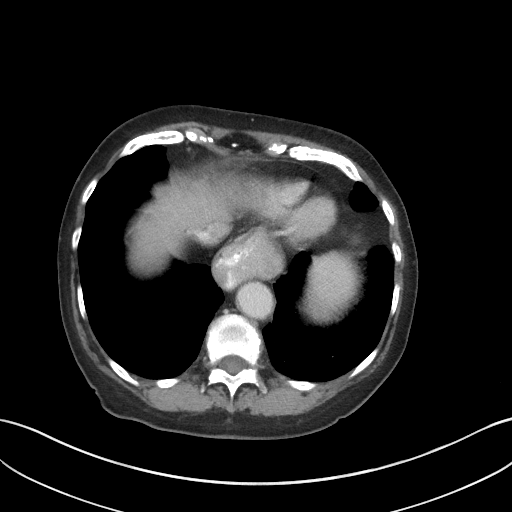

[Series 5: coronal st · coronal · 0.68mm/px · 3 of 84 slices shown]
[im 28/84  soft-tissue]
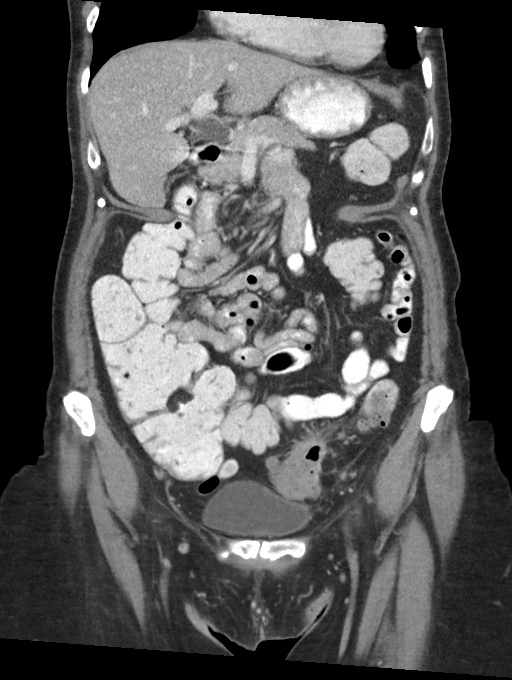
[im 37/84  soft-tissue]
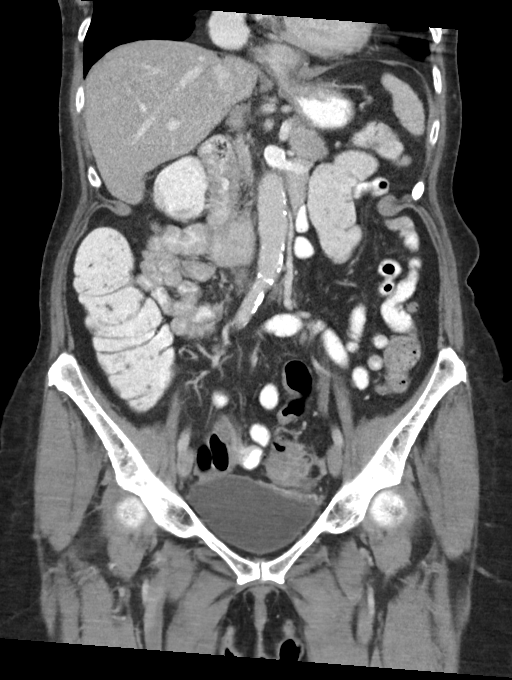
[im 47/84  soft-tissue]
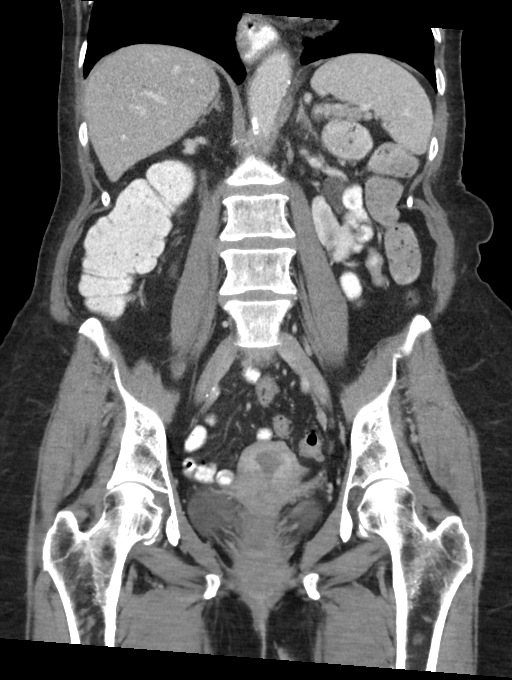

[16 of 46 positions shown; findings below may reference images not displayed]

FINDINGS: Lower chest: Heart is borderline in size. Moderate-sized hiatal
hernia. No acute findings.

Hepatobiliary: No focal liver abnormality is seen. Status post
cholecystectomy. No biliary dilatation.

Pancreas: No focal abnormality or ductal dilatation.

Spleen: No focal abnormality.  Normal size.

Adrenals/Urinary Tract: Punctate nonobstructing stone in the upper
pole of the left kidney and lower pole of left kidney. No
hydronephrosis. No ureteral stones. Urinary bladder and adrenal
glands unremarkable.

Stomach/Bowel: Sigmoid diverticulosis. Inflammatory stranding around
the sigmoid colon compatible with active diverticulitis. No focal
triple fluid collection to suggest pericolonic abscess.

Vascular/Lymphatic: Aortic atherosclerosis. No enlarged abdominal or
pelvic lymph nodes.

Reproductive: Uterus and adnexa unremarkable.  No mass.

Other: No free fluid or free air.

Musculoskeletal: No acute bony abnormality.
IMPRESSION: Extensive sigmoid diverticulosis. Inflammatory stranding around the
mid sigmoid colon in a similar location to prior study compatible
with diverticulitis. Previously seen pericolonic abscess not
visualized.

Nonobstructing left punctate nephrolithiasis.

Aortic atherosclerosis.

Moderate-sized hiatal hernia.

## 2020-02-17 ENCOUNTER — Inpatient Hospital Stay (HOSPITAL_BASED_OUTPATIENT_CLINIC_OR_DEPARTMENT_OTHER): Payer: Medicare Other | Admitting: Internal Medicine

## 2020-02-17 ENCOUNTER — Inpatient Hospital Stay: Payer: Medicare Other

## 2020-02-17 ENCOUNTER — Other Ambulatory Visit: Payer: Self-pay

## 2020-02-17 DIAGNOSIS — D708 Other neutropenia: Secondary | ICD-10-CM

## 2020-02-17 LAB — BASIC METABOLIC PANEL
Anion gap: 11 (ref 5–15)
BUN: 15 mg/dL (ref 8–23)
CO2: 28 mmol/L (ref 22–32)
Calcium: 8.6 mg/dL — ABNORMAL LOW (ref 8.9–10.3)
Chloride: 101 mmol/L (ref 98–111)
Creatinine, Ser: 1.04 mg/dL — ABNORMAL HIGH (ref 0.44–1.00)
GFR calc Af Amer: 58 mL/min — ABNORMAL LOW (ref >60)
GFR calc non Af Amer: 50 mL/min — ABNORMAL LOW (ref >60)
Glucose, Bld: 96 mg/dL (ref 70–99)
Potassium: 4 mmol/L (ref 3.5–5.1)
Sodium: 140 mmol/L (ref 135–145)

## 2020-02-17 LAB — CBC WITH DIFFERENTIAL/PLATELET
Abs Immature Granulocytes: 0 10*3/uL (ref 0.00–0.07)
Band Neutrophils: 4 %
Basophils Absolute: 0 10*3/uL (ref 0.0–0.1)
Basophils Relative: 0 %
Eosinophils Absolute: 0 10*3/uL (ref 0.0–0.5)
Eosinophils Relative: 1 %
HCT: 43.9 % (ref 36.0–46.0)
Hemoglobin: 14.1 g/dL (ref 12.0–15.0)
Lymphocytes Relative: 26 %
Lymphs Abs: 0.8 10*3/uL (ref 0.7–4.0)
MCH: 29.4 pg (ref 26.0–34.0)
MCHC: 32.1 g/dL (ref 30.0–36.0)
MCV: 91.6 fL (ref 80.0–100.0)
Monocytes Absolute: 0.2 10*3/uL (ref 0.1–1.0)
Monocytes Relative: 6 %
Neutro Abs: 2.1 10*3/uL (ref 1.7–7.7)
Neutrophils Relative %: 63 %
Platelets: 208 10*3/uL (ref 150–400)
RBC: 4.79 MIL/uL (ref 3.87–5.11)
RDW: 14.9 % (ref 11.5–15.5)
Smear Review: ADEQUATE
WBC: 3.2 10*3/uL — ABNORMAL LOW (ref 4.0–10.5)
nRBC: 0 % (ref 0.0–0.2)

## 2020-02-17 NOTE — Addendum Note (Signed)
Addended by: Delice Bison E on: 02/17/2020 03:29 PM   Modules accepted: Orders

## 2020-02-17 NOTE — Progress Notes (Signed)
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  There was abundant mature neutrophils.  Significant dyspoiesis or increased blasts was not identified.  Flow cytometry revealed a predominance of T lymphocytes (16% of all cells) with no abnormal phenotype.  There was a minor B-cell population (13% of lymphocytes) with slight kappa light chain excess.  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  Oncology History   No history exists.    HISTORY OF PRESENTING ILLNESS:  [name] 85 y.o.  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  She denies any recent infections.  Denies any hospitalizations.  No need for antibiotics.  Complains of mild bilateral thigh weakness/especially mornings.  Review of Systems  Constitutional: Negative for chills, diaphoresis, fever, malaise/fatigue and weight loss.  HENT: Negative for nosebleeds and sore throat.   Eyes: Negative for double vision.  Respiratory: Negative for cough, hemoptysis, sputum production, shortness of breath and wheezing.   Cardiovascular: Positive for leg swelling. Negative for chest pain, palpitations and orthopnea.  Gastrointestinal: Negative  for blood in stool, constipation, diarrhea, heartburn, melena, nausea and vomiting.       Around parastomal hernia.  Genitourinary: Negative for dysuria, frequency and urgency.  Musculoskeletal: Negative for back pain and joint pain.  Skin: Negative for itching.  Neurological: Positive for tingling. Negative for dizziness, focal weakness, weakness and headaches.  Endo/Heme/Allergies: Bruises/bleeds easily.  Psychiatric/Behavioral: Negative for depression. The patient is nervous/anxious. The patient does not have insomnia.      MEDICAL HISTORY:  Past Medical History:  Diagnosis Date  . Breast cancer ([place]) 1985   bilateral mastectomies  . Depression    1 time specific situation that she did not know how to deal wiht  . Diverticulosis   . Hiatal hernia   . History of Bell's palsy   . History of colon polyps   . History of nephrolithiasis   . History of pancreatitis   . Hyperlipidemia   . IBS (irritable bowel syndrome)   . Osteoporosis    osteopenia in neck  . Skin cancer   . Temporal arteritis ([place])     SURGICAL HISTORY: Past Surgical History:  Procedure Laterality Date  . AUGMENTATION MAMMAPLASTY Bilateral 2000  . COLON RESECTION SIGMOID N/A 10/13/2017   Procedure: COLON RESECTION SIGMOID;  Surgeon: Herbert Pun, MD;  Location: [facility] ORS;  Service: General;  Laterality: N/A;  . COLOSTOMY N/A 10/13/2017   Procedure: COLOSTOMY;  Surgeon: Herbert Pun, MD;  Location: [facility] ORS;  Service: General;  Laterality: N/A;  . LAPAROSCOPIC CHOLECYSTECTOMY  2009  . MASTECTOMY  1983   bilateral    SOCIAL HISTORY: Social History   Socioeconomic History  . Marital status: Widowed    Spouse name: Not on file  . Number of children: 2  . Years of education: college  . Highest education level: Not on  file  Occupational History  . Occupation: Engineer, production  . Occupation: Retired  Tobacco Use  . Smoking status: Former Smoker    Packs/day: 0.25    Years: 30.00    Pack  years: 7.50    Types: Cigarettes    Quit date: 05/17/2005    Years since quitting: 14.7  . Smokeless tobacco: Never Used  . Tobacco comment: quit 11 years ago  Vaping Use  . Vaping Use: Never used  Substance and Sexual Activity  . Alcohol use: No    Alcohol/week: 0.0 standard drinks  . Drug use: No  . Sexual activity: Not Currently  Other Topics Concern  . Not on file  Social History Narrative   Patient lives at home alone.    Patient is retired.    Patient has some college.    Patient has 2 children.   Social Determinants of Health   Financial Resource Strain:   . Difficulty of Paying Living Expenses: Not on file  Food Insecurity:   . Worried About Charity fundraiser in the Last Year: Not on file  . Ran Out of Food in the Last Year: Not on file  Transportation Needs:   . Lack of Transportation (Medical): Not on file  . Lack of Transportation (Non-Medical): Not on file  Physical Activity:   . Days of Exercise per Week: Not on file  . Minutes of Exercise per Session: Not on file  Stress:   . Feeling of Stress : Not on file  Social Connections:   . Frequency of Communication with Friends and Family: Not on file  . Frequency of Social Gatherings with Friends and Family: Not on file  . Attends Religious Services: Not on file  . Active Member of Clubs or Organizations: Not on file  . Attends Archivist Meetings: Not on file  . Marital Status: Not on file  Intimate Partner Violence:   . Fear of Current or Ex-Partner: Not on file  . Emotionally Abused: Not on file  . Physically Abused: Not on file  . Sexually Abused: Not on file    FAMILY HISTORY: Family History  Problem Relation Age of Onset  . Colon cancer Mother 33  . Scleroderma Father 69  . Alcoholism Brother   . Stomach cancer Neg Hx     ALLERGIES:  is allergic to morphine, benzalkonium chloride, escitalopram, hydralazine, neomycin-bacitracin zn-polymyx, and  bacitracin-neomycin-polymyxin.  MEDICATIONS:  Current Outpatient Medications  Medication Sig Dispense Refill  . amLODipine (NORVASC) 5 MG tablet Take 2.5 mg by mouth.     . Ascorbic Acid (VITAMIN C) 1000 MG tablet Take 1,000 mg by mouth daily.    Marland Kitchen aspirin EC 81 MG tablet Take 81 mg daily by mouth.    . Biotin 1000 MCG tablet Take 1,000 mcg by mouth daily.     . Cholecalciferol (VITAMIN D3) 2000 units capsule Take 2,000 Units daily by mouth.     . Cyanocobalamin (B-12 PO) Take 2,500 mcg daily by mouth.     Karle Barr (ZARXIO) 480 MCG/0.8ML SOSY injection Inject 0.8 mLs (480 mcg total) into the skin once a week. Administer injection subcutaneously 3.2 mL 6  . predniSONE (DELTASONE) 5 MG tablet Take 2.5 mg by mouth daily.     . traMADol (ULTRAM) 50 MG tablet Take 50 mg by mouth daily.     Marland Kitchen ZINC OXIDE PO Take 1 tablet by mouth daily.    Marland Kitchen ALPRAZolam (XANAX) 0.25 MG tablet Take 0.25 mg by  mouth 2 (two) times daily as needed.  (Patient not taking: Reported on 02/16/2020)     No current facility-administered medications for this visit.    PHYSICAL EXAMINATION: ECOG PERFORMANCE STATUS: 0 - Asymptomatic  Vitals:   02/17/20 1009  BP: (!) 164/56  Pulse: 79  Resp: 20  Temp: 98 F (36.7 C)   Filed Weights   02/17/20 1009  Weight: 173 lb (78.5 kg)    Physical Exam Constitutional:      Comments: Alone.  HENT:     Head: Normocephalic and atraumatic.     Mouth/Throat:     Pharynx: No oropharyngeal exudate.  Eyes:     Pupils: Pupils are equal, round, and reactive to light.  Cardiovascular:     Rate and Rhythm: Normal rate and regular rhythm.  Pulmonary:     Effort: No respiratory distress.     Breath sounds: No wheezing.  Abdominal:     General: Bowel sounds are normal. There is no distension.     Palpations: Abdomen is soft. There is no mass.     Tenderness: There is no abdominal tenderness. There is no guarding or rebound.     Comments: Colostomy.  Parastomal hernia.   Musculoskeletal:        General: No tenderness. Normal range of motion.     Cervical back: Normal range of motion and neck supple.  Skin:    General: Skin is warm.     Comments: Hyperpigmented rash noted bilateral shins.  Neurological:     Mental Status: She is alert and oriented to person, place, and time.  Psychiatric:        Mood and Affect: Affect normal.     Comments: Anxious.      LABORATORY DATA:  I have reviewed the data as listed Lab Results  Component Value Date   WBC 3.2 (L) 02/17/2020   HGB 14.1 02/17/2020   HCT 43.9 02/17/2020   MCV 91.6 02/17/2020   PLT 208 02/17/2020   Recent Labs    09/24/19 1916 01/06/20 0936 02/17/20 0955  NA 139 140 140  K 4.1 4.2 4.0  CL 101 101 101  CO2 28 28 28   GLUCOSE 91 107* 96  BUN 19 19 15   CREATININE 1.03* 0.89 1.04*  CALCIUM 9.3 8.8* 8.6*  GFRNONAA 50* 60* 50*  GFRAA 58* >60 58*    RADIOGRAPHIC STUDIES: I have personally reviewed the radiological images as listed and agreed with the findings in the report. No results found.  ASSESSMENT & PLAN:   Autoimmune neutropenia (HCC) # Autoimmune neutropenia- once a week- if ANC < 1.5- zaxio-patient's ANC today-2.4; HOLD zarxio today; otherwise continue weekly zarxio.  Patient clinically asymptomatic.    # Prednisone 5 mg/day [will fill]; continue at current dose.  Prednisone 5 mg seems to be helping.  Neutropenia.    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   # Elevated blood pressure ; no history of [id number] systolic-likely situational/anxiety. STABLE.   # Easy burising: sec to prednisone. 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  # COVID BOOSTER: Discussed given patient's diagnosis and other comorbidities/therapies-patient would be considered immunocompromised.  As per CDC recommendation/FDA approval-I would recommend booster vaccine.  Patient is  interested. Also recommend flu shots.   # DISPOSITION: # HOLD ZARXIO today # weekly cbc/zaxio x 8 # follow up with MD in 8  weeks- cbc/bmp-zarxio-Dr.B  All questions were answered.  The patient knows to call the clinic with any problems, questions or concerns.    Cammie Sickle, MD 02/17/2020 12:52 PM

## 2020-02-17 NOTE — Assessment & Plan Note (Addendum)
#   Autoimmune neutropenia- once a week- if ANC < 1.5- zaxio-patient's ANC today-2.4; HOLD zarxio today; otherwise continue weekly zarxio.  Patient clinically asymptomatic.    # Prednisone 5 mg/day [will fill]; continue at current dose.  Prednisone 5 mg seems to be helping.  Neutropenia.    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   # Elevated blood pressure ; no history of [id number] systolic-likely situational/anxiety. STABLE.   # Easy burising: sec to prednisone. 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  # COVID BOOSTER: Discussed given patient's diagnosis and other comorbidities/therapies-patient would be considered immunocompromised.  As per CDC recommendation/FDA approval-I would recommend booster vaccine.  Patient is interested. Also recommend flu shots.   # DISPOSITION: # HOLD ZARXIO today # weekly cbc/zaxio x 8 # follow up with MD in 8  weeks- cbc/bmp-zarxio-Dr.B

## 2020-02-24 ENCOUNTER — Inpatient Hospital Stay: Payer: Medicare Other

## 2020-02-24 ENCOUNTER — Other Ambulatory Visit: Payer: Self-pay

## 2020-02-24 VITALS — BP 174/58 | HR 77 | Temp 97.2°F | Resp 20

## 2020-02-24 DIAGNOSIS — D708 Other neutropenia: Secondary | ICD-10-CM | POA: Diagnosis not present

## 2020-02-24 DIAGNOSIS — D72819 Decreased white blood cell count, unspecified: Secondary | ICD-10-CM

## 2020-02-24 LAB — CBC WITH DIFFERENTIAL/PLATELET
Abs Immature Granulocytes: 0.02 10*3/uL (ref 0.00–0.07)
Basophils Absolute: 0 10*3/uL (ref 0.0–0.1)
Basophils Relative: 1 %
Eosinophils Absolute: 0 10*3/uL (ref 0.0–0.5)
Eosinophils Relative: 2 %
HCT: 44.1 % (ref 36.0–46.0)
Hemoglobin: 14.1 g/dL (ref 12.0–15.0)
Immature Granulocytes: 1 %
Lymphocytes Relative: 37 %
Lymphs Abs: 1 10*3/uL (ref 0.7–4.0)
MCH: 29.5 pg (ref 26.0–34.0)
MCHC: 32 g/dL (ref 30.0–36.0)
MCV: 92.3 fL (ref 80.0–100.0)
Monocytes Absolute: 0.4 10*3/uL (ref 0.1–1.0)
Monocytes Relative: 13 %
Neutro Abs: 1.3 10*3/uL — ABNORMAL LOW (ref 1.7–7.7)
Neutrophils Relative %: 46 %
Platelets: 191 10*3/uL (ref 150–400)
RBC: 4.78 MIL/uL (ref 3.87–5.11)
RDW: 15 % (ref 11.5–15.5)
WBC: 2.7 10*3/uL — ABNORMAL LOW (ref 4.0–10.5)
nRBC: 0 % (ref 0.0–0.2)

## 2020-02-24 MED ORDER — FILGRASTIM-SNDZ 480 MCG/0.8ML IJ SOSY
480.0000 ug | PREFILLED_SYRINGE | Freq: Once | INTRAMUSCULAR | Status: AC
  Administered 2020-02-24: 480 ug via SUBCUTANEOUS
  Filled 2020-02-24: qty 0.8

## 2020-03-01 ENCOUNTER — Ambulatory Visit: Payer: Medicare Other | Attending: Internal Medicine

## 2020-03-01 DIAGNOSIS — Z23 Encounter for immunization: Secondary | ICD-10-CM

## 2020-03-01 NOTE — Progress Notes (Signed)
   Covid-19 Vaccination Clinic  Name:  [name]    MRN: [id number] DOB: [birth year]  03/01/2020  [name] was observed post Covid-19 immunization for 15 minutes without incident. She was provided with Vaccine Information Sheet and instruction to access the V-Safe system.   [name] was instructed to call 911 with any severe reactions post vaccine: Marland Kitchen Difficulty breathing  . Swelling of face and throat  . A fast heartbeat  . A bad rash all over body  . Dizziness and weakness

## 2020-03-02 ENCOUNTER — Inpatient Hospital Stay: Payer: Medicare Other

## 2020-03-02 ENCOUNTER — Inpatient Hospital Stay: Payer: Medicare Other | Attending: Internal Medicine

## 2020-03-02 ENCOUNTER — Other Ambulatory Visit: Payer: Self-pay

## 2020-03-02 DIAGNOSIS — M7989 Other specified soft tissue disorders: Secondary | ICD-10-CM | POA: Insufficient documentation

## 2020-03-02 DIAGNOSIS — Z808 Family history of malignant neoplasm of other organs or systems: Secondary | ICD-10-CM | POA: Diagnosis not present

## 2020-03-02 DIAGNOSIS — D708 Other neutropenia: Secondary | ICD-10-CM | POA: Diagnosis not present

## 2020-03-02 DIAGNOSIS — Z87891 Personal history of nicotine dependence: Secondary | ICD-10-CM | POA: Insufficient documentation

## 2020-03-02 DIAGNOSIS — Z811 Family history of alcohol abuse and dependence: Secondary | ICD-10-CM | POA: Insufficient documentation

## 2020-03-02 DIAGNOSIS — Z800 Family history of malignant neoplasm of digestive organs: Secondary | ICD-10-CM | POA: Diagnosis not present

## 2020-03-02 DIAGNOSIS — Z79899 Other long term (current) drug therapy: Secondary | ICD-10-CM | POA: Diagnosis not present

## 2020-03-02 DIAGNOSIS — R030 Elevated blood-pressure reading, without diagnosis of hypertension: Secondary | ICD-10-CM | POA: Diagnosis not present

## 2020-03-02 DIAGNOSIS — M316 Other giant cell arteritis: Secondary | ICD-10-CM | POA: Diagnosis not present

## 2020-03-02 LAB — CBC WITH DIFFERENTIAL/PLATELET
Abs Immature Granulocytes: 0 10*3/uL (ref 0.00–0.07)
Band Neutrophils: 2 %
Basophils Absolute: 0 10*3/uL (ref 0.0–0.1)
Basophils Relative: 0 %
Eosinophils Absolute: 0 10*3/uL (ref 0.0–0.5)
Eosinophils Relative: 1 %
HCT: 45.1 % (ref 36.0–46.0)
Hemoglobin: 14.6 g/dL (ref 12.0–15.0)
Lymphocytes Relative: 22 %
Lymphs Abs: 0.8 10*3/uL (ref 0.7–4.0)
MCH: 29.6 pg (ref 26.0–34.0)
MCHC: 32.4 g/dL (ref 30.0–36.0)
MCV: 91.5 fL (ref 80.0–100.0)
Monocytes Absolute: 0.3 10*3/uL (ref 0.1–1.0)
Monocytes Relative: 9 %
Neutro Abs: 2.4 10*3/uL (ref 1.7–7.7)
Neutrophils Relative %: 66 %
Platelets: 220 10*3/uL (ref 150–400)
RBC: 4.93 MIL/uL (ref 3.87–5.11)
RDW: 15.2 % (ref 11.5–15.5)
Smear Review: NORMAL
WBC: 3.6 10*3/uL — ABNORMAL LOW (ref 4.0–10.5)
nRBC: 0 % (ref 0.0–0.2)

## 2020-03-09 ENCOUNTER — Inpatient Hospital Stay: Payer: Medicare Other

## 2020-03-09 ENCOUNTER — Other Ambulatory Visit: Payer: Self-pay

## 2020-03-09 DIAGNOSIS — D72819 Decreased white blood cell count, unspecified: Secondary | ICD-10-CM

## 2020-03-09 DIAGNOSIS — D708 Other neutropenia: Secondary | ICD-10-CM | POA: Diagnosis not present

## 2020-03-09 LAB — CBC WITH DIFFERENTIAL/PLATELET
Abs Immature Granulocytes: 0.03 10*3/uL (ref 0.00–0.07)
Basophils Absolute: 0 10*3/uL (ref 0.0–0.1)
Basophils Relative: 1 %
Eosinophils Absolute: 0.1 10*3/uL (ref 0.0–0.5)
Eosinophils Relative: 3 %
HCT: 44.1 % (ref 36.0–46.0)
Hemoglobin: 13.9 g/dL (ref 12.0–15.0)
Immature Granulocytes: 1 %
Lymphocytes Relative: 42 %
Lymphs Abs: 1.2 10*3/uL (ref 0.7–4.0)
MCH: 29.3 pg (ref 26.0–34.0)
MCHC: 31.5 g/dL (ref 30.0–36.0)
MCV: 93 fL (ref 80.0–100.0)
Monocytes Absolute: 0.3 10*3/uL (ref 0.1–1.0)
Monocytes Relative: 10 %
Neutro Abs: 1.2 10*3/uL — ABNORMAL LOW (ref 1.7–7.7)
Neutrophils Relative %: 43 %
Platelets: 238 10*3/uL (ref 150–400)
RBC: 4.74 MIL/uL (ref 3.87–5.11)
RDW: 14.8 % (ref 11.5–15.5)
WBC: 2.8 10*3/uL — ABNORMAL LOW (ref 4.0–10.5)
nRBC: 0 % (ref 0.0–0.2)

## 2020-03-09 MED ORDER — FILGRASTIM-SNDZ 480 MCG/0.8ML IJ SOSY
480.0000 ug | PREFILLED_SYRINGE | Freq: Once | INTRAMUSCULAR | Status: AC
  Administered 2020-03-09: 480 ug via SUBCUTANEOUS
  Filled 2020-03-09: qty 0.8

## 2020-03-16 ENCOUNTER — Inpatient Hospital Stay: Payer: Medicare Other

## 2020-03-16 ENCOUNTER — Other Ambulatory Visit: Payer: Self-pay

## 2020-03-16 DIAGNOSIS — D708 Other neutropenia: Secondary | ICD-10-CM

## 2020-03-16 DIAGNOSIS — D72819 Decreased white blood cell count, unspecified: Secondary | ICD-10-CM

## 2020-03-16 LAB — CBC WITH DIFFERENTIAL/PLATELET
Abs Immature Granulocytes: 0 10*3/uL (ref 0.00–0.07)
Band Neutrophils: 1 %
Basophils Absolute: 0 10*3/uL (ref 0.0–0.1)
Basophils Relative: 0 %
Eosinophils Absolute: 0.1 10*3/uL (ref 0.0–0.5)
Eosinophils Relative: 4 %
HCT: 44.3 % (ref 36.0–46.0)
Hemoglobin: 14.2 g/dL (ref 12.0–15.0)
Lymphocytes Relative: 48 %
Lymphs Abs: 1.3 10*3/uL (ref 0.7–4.0)
MCH: 29.6 pg (ref 26.0–34.0)
MCHC: 32.1 g/dL (ref 30.0–36.0)
MCV: 92.3 fL (ref 80.0–100.0)
Monocytes Absolute: 0.4 10*3/uL (ref 0.1–1.0)
Monocytes Relative: 14 %
Neutro Abs: 0.9 10*3/uL — ABNORMAL LOW (ref 1.7–7.7)
Neutrophils Relative %: 33 %
Platelets: 219 10*3/uL (ref 150–400)
RBC: 4.8 MIL/uL (ref 3.87–5.11)
RDW: 15.4 % (ref 11.5–15.5)
Smear Review: NORMAL
WBC: 2.7 10*3/uL — ABNORMAL LOW (ref 4.0–10.5)
nRBC: 0 % (ref 0.0–0.2)

## 2020-03-16 MED ORDER — FILGRASTIM-SNDZ 480 MCG/0.8ML IJ SOSY
480.0000 ug | PREFILLED_SYRINGE | Freq: Once | INTRAMUSCULAR | Status: AC
  Administered 2020-03-16: 480 ug via SUBCUTANEOUS
  Filled 2020-03-16: qty 0.8

## 2020-03-23 ENCOUNTER — Inpatient Hospital Stay: Payer: Medicare Other

## 2020-03-23 ENCOUNTER — Other Ambulatory Visit: Payer: Self-pay

## 2020-03-23 DIAGNOSIS — D708 Other neutropenia: Secondary | ICD-10-CM

## 2020-03-23 DIAGNOSIS — D72819 Decreased white blood cell count, unspecified: Secondary | ICD-10-CM

## 2020-03-23 LAB — CBC WITH DIFFERENTIAL/PLATELET
Abs Immature Granulocytes: 0 10*3/uL (ref 0.00–0.07)
Basophils Absolute: 0 10*3/uL (ref 0.0–0.1)
Basophils Relative: 0 %
Eosinophils Absolute: 0.1 10*3/uL (ref 0.0–0.5)
Eosinophils Relative: 2 %
HCT: 43.5 % (ref 36.0–46.0)
Hemoglobin: 14.1 g/dL (ref 12.0–15.0)
Lymphocytes Relative: 39 %
Lymphs Abs: 1 10*3/uL (ref 0.7–4.0)
MCH: 29.9 pg (ref 26.0–34.0)
MCHC: 32.4 g/dL (ref 30.0–36.0)
MCV: 92.2 fL (ref 80.0–100.0)
Monocytes Absolute: 0.4 10*3/uL (ref 0.1–1.0)
Monocytes Relative: 16 %
Neutro Abs: 1.1 10*3/uL — ABNORMAL LOW (ref 1.7–7.7)
Neutrophils Relative %: 43 %
Platelets: 191 10*3/uL (ref 150–400)
RBC: 4.72 MIL/uL (ref 3.87–5.11)
RDW: 15.4 % (ref 11.5–15.5)
Smear Review: NORMAL
WBC: 2.5 10*3/uL — ABNORMAL LOW (ref 4.0–10.5)
nRBC: 0 % (ref 0.0–0.2)

## 2020-03-23 MED ORDER — FILGRASTIM-SNDZ 480 MCG/0.8ML IJ SOSY
480.0000 ug | PREFILLED_SYRINGE | Freq: Once | INTRAMUSCULAR | Status: AC
  Administered 2020-03-23: 480 ug via SUBCUTANEOUS
  Filled 2020-03-23: qty 0.8

## 2020-03-30 ENCOUNTER — Inpatient Hospital Stay: Payer: Medicare Other | Attending: Internal Medicine

## 2020-03-30 ENCOUNTER — Inpatient Hospital Stay: Payer: Medicare Other

## 2020-03-30 ENCOUNTER — Other Ambulatory Visit: Payer: Self-pay

## 2020-03-30 DIAGNOSIS — M359 Systemic involvement of connective tissue, unspecified: Secondary | ICD-10-CM | POA: Diagnosis not present

## 2020-03-30 DIAGNOSIS — D708 Other neutropenia: Secondary | ICD-10-CM | POA: Insufficient documentation

## 2020-03-30 DIAGNOSIS — M7989 Other specified soft tissue disorders: Secondary | ICD-10-CM | POA: Diagnosis not present

## 2020-03-30 DIAGNOSIS — D72819 Decreased white blood cell count, unspecified: Secondary | ICD-10-CM

## 2020-03-30 DIAGNOSIS — Z79899 Other long term (current) drug therapy: Secondary | ICD-10-CM | POA: Diagnosis not present

## 2020-03-30 DIAGNOSIS — Z87448 Personal history of other diseases of urinary system: Secondary | ICD-10-CM | POA: Diagnosis not present

## 2020-03-30 DIAGNOSIS — Z8719 Personal history of other diseases of the digestive system: Secondary | ICD-10-CM | POA: Insufficient documentation

## 2020-03-30 DIAGNOSIS — Z811 Family history of alcohol abuse and dependence: Secondary | ICD-10-CM | POA: Insufficient documentation

## 2020-03-30 DIAGNOSIS — Z87891 Personal history of nicotine dependence: Secondary | ICD-10-CM | POA: Diagnosis not present

## 2020-03-30 DIAGNOSIS — Z840 Family history of diseases of the skin and subcutaneous tissue: Secondary | ICD-10-CM | POA: Diagnosis not present

## 2020-03-30 DIAGNOSIS — Z800 Family history of malignant neoplasm of digestive organs: Secondary | ICD-10-CM | POA: Insufficient documentation

## 2020-03-30 DIAGNOSIS — Z86010 Personal history of colonic polyps: Secondary | ICD-10-CM | POA: Insufficient documentation

## 2020-03-30 DIAGNOSIS — R030 Elevated blood-pressure reading, without diagnosis of hypertension: Secondary | ICD-10-CM | POA: Insufficient documentation

## 2020-03-30 LAB — CBC WITH DIFFERENTIAL/PLATELET
Abs Immature Granulocytes: 0 10*3/uL (ref 0.00–0.07)
Band Neutrophils: 2 %
Basophils Absolute: 0 10*3/uL (ref 0.0–0.1)
Basophils Relative: 2 %
Eosinophils Absolute: 0 10*3/uL (ref 0.0–0.5)
Eosinophils Relative: 2 %
HCT: 44.7 % (ref 36.0–46.0)
Hemoglobin: 14.1 g/dL (ref 12.0–15.0)
Lymphocytes Relative: 52 %
Lymphs Abs: 1.2 10*3/uL (ref 0.7–4.0)
MCH: 29.1 pg (ref 26.0–34.0)
MCHC: 31.5 g/dL (ref 30.0–36.0)
MCV: 92.4 fL (ref 80.0–100.0)
Monocytes Absolute: 0.1 10*3/uL (ref 0.1–1.0)
Monocytes Relative: 6 %
Neutro Abs: 0.9 10*3/uL — ABNORMAL LOW (ref 1.7–7.7)
Neutrophils Relative %: 36 %
Platelets: 205 10*3/uL (ref 150–400)
RBC: 4.84 MIL/uL (ref 3.87–5.11)
RDW: 15.5 % (ref 11.5–15.5)
Smear Review: ADEQUATE
WBC: 2.3 10*3/uL — ABNORMAL LOW (ref 4.0–10.5)
nRBC: 0 % (ref 0.0–0.2)

## 2020-03-30 MED ORDER — FILGRASTIM-SNDZ 480 MCG/0.8ML IJ SOSY
480.0000 ug | PREFILLED_SYRINGE | Freq: Once | INTRAMUSCULAR | Status: AC
  Administered 2020-03-30: 480 ug via SUBCUTANEOUS
  Filled 2020-03-30: qty 0.8

## 2020-04-06 ENCOUNTER — Other Ambulatory Visit: Payer: Self-pay

## 2020-04-06 ENCOUNTER — Inpatient Hospital Stay: Payer: Medicare Other

## 2020-04-06 DIAGNOSIS — D708 Other neutropenia: Secondary | ICD-10-CM

## 2020-04-06 DIAGNOSIS — D72819 Decreased white blood cell count, unspecified: Secondary | ICD-10-CM

## 2020-04-06 LAB — CBC WITH DIFFERENTIAL/PLATELET
Abs Immature Granulocytes: 0.03 10*3/uL (ref 0.00–0.07)
Basophils Absolute: 0 10*3/uL (ref 0.0–0.1)
Basophils Relative: 1 %
Eosinophils Absolute: 0.1 10*3/uL (ref 0.0–0.5)
Eosinophils Relative: 3 %
HCT: 44.5 % (ref 36.0–46.0)
Hemoglobin: 14.3 g/dL (ref 12.0–15.0)
Immature Granulocytes: 1 %
Lymphocytes Relative: 47 %
Lymphs Abs: 1.1 10*3/uL (ref 0.7–4.0)
MCH: 29.7 pg (ref 26.0–34.0)
MCHC: 32.1 g/dL (ref 30.0–36.0)
MCV: 92.3 fL (ref 80.0–100.0)
Monocytes Absolute: 0.3 10*3/uL (ref 0.1–1.0)
Monocytes Relative: 14 %
Neutro Abs: 0.8 10*3/uL — ABNORMAL LOW (ref 1.7–7.7)
Neutrophils Relative %: 34 %
Platelets: 189 10*3/uL (ref 150–400)
RBC: 4.82 MIL/uL (ref 3.87–5.11)
RDW: 15.1 % (ref 11.5–15.5)
Smear Review: ADEQUATE
WBC: 2.3 10*3/uL — ABNORMAL LOW (ref 4.0–10.5)
nRBC: 0 % (ref 0.0–0.2)

## 2020-04-06 MED ORDER — FILGRASTIM-SNDZ 480 MCG/0.8ML IJ SOSY
480.0000 ug | PREFILLED_SYRINGE | Freq: Once | INTRAMUSCULAR | Status: AC
  Administered 2020-04-06: 480 ug via SUBCUTANEOUS
  Filled 2020-04-06: qty 0.8

## 2020-04-13 ENCOUNTER — Other Ambulatory Visit: Payer: Self-pay

## 2020-04-13 ENCOUNTER — Inpatient Hospital Stay: Payer: Medicare Other

## 2020-04-13 ENCOUNTER — Inpatient Hospital Stay (HOSPITAL_BASED_OUTPATIENT_CLINIC_OR_DEPARTMENT_OTHER): Payer: Medicare Other | Admitting: Internal Medicine

## 2020-04-13 DIAGNOSIS — D72819 Decreased white blood cell count, unspecified: Secondary | ICD-10-CM

## 2020-04-13 DIAGNOSIS — D708 Other neutropenia: Secondary | ICD-10-CM

## 2020-04-13 LAB — BASIC METABOLIC PANEL
Anion gap: 12 (ref 5–15)
BUN: 17 mg/dL (ref 8–23)
CO2: 28 mmol/L (ref 22–32)
Calcium: 9.2 mg/dL (ref 8.9–10.3)
Chloride: 101 mmol/L (ref 98–111)
Creatinine, Ser: 0.9 mg/dL (ref 0.44–1.00)
GFR, Estimated: >60 mL/min (ref >60)
Glucose, Bld: 111 mg/dL — ABNORMAL HIGH (ref 70–99)
Potassium: 4.2 mmol/L (ref 3.5–5.1)
Sodium: 141 mmol/L (ref 135–145)

## 2020-04-13 LAB — CBC WITH DIFFERENTIAL/PLATELET
Abs Immature Granulocytes: 0.02 10*3/uL (ref 0.00–0.07)
Basophils Absolute: 0 10*3/uL (ref 0.0–0.1)
Basophils Relative: 1 %
Eosinophils Absolute: 0 10*3/uL (ref 0.0–0.5)
Eosinophils Relative: 2 %
HCT: 44.1 % (ref 36.0–46.0)
Hemoglobin: 14.3 g/dL (ref 12.0–15.0)
Immature Granulocytes: 1 %
Lymphocytes Relative: 55 %
Lymphs Abs: 1.3 10*3/uL (ref 0.7–4.0)
MCH: 29.8 pg (ref 26.0–34.0)
MCHC: 32.4 g/dL (ref 30.0–36.0)
MCV: 91.9 fL (ref 80.0–100.0)
Monocytes Absolute: 0.3 10*3/uL (ref 0.1–1.0)
Monocytes Relative: 14 %
Neutro Abs: 0.6 10*3/uL — ABNORMAL LOW (ref 1.7–7.7)
Neutrophils Relative %: 27 %
Platelets: 222 10*3/uL (ref 150–400)
RBC: 4.8 MIL/uL (ref 3.87–5.11)
RDW: 15.6 % — ABNORMAL HIGH (ref 11.5–15.5)
Smear Review: NORMAL
WBC Morphology: REACTIVE
WBC: 2.3 10*3/uL — ABNORMAL LOW (ref 4.0–10.5)
nRBC: 0 % (ref 0.0–0.2)

## 2020-04-13 MED ORDER — FILGRASTIM-SNDZ 480 MCG/0.8ML IJ SOSY
480.0000 ug | PREFILLED_SYRINGE | Freq: Once | INTRAMUSCULAR | Status: AC
  Administered 2020-04-13: 480 ug via SUBCUTANEOUS
  Filled 2020-04-13: qty 0.8

## 2020-04-13 NOTE — Assessment & Plan Note (Addendum)
#   Autoimmune neutropenia- once a week- if ANC < 1.5- zaxio-patient's WBC- today-2.3; ANC- 0.6;  Continue zarxio today; otherwise continue weekly zarxio.  Patient clinically asymptomatic.  Continue cbc/zarxioweekly/pt pref  # Prednisone 5 mg/day [will fill]; continue at current dose.   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   # Elevated blood pressure ; no history of [id number] systolic-likely situational/anxiety. STABLE.   # Easy burising: sec to prednisone. STABLE.    # DISPOSITION: # proceed with ZARXIO today # weekly cbc/zaxio x 8 # follow up with MD in 8  weeks- cbc/bmp-zarxio-Dr.B

## 2020-04-13 NOTE — Progress Notes (Signed)
[place] NOTE  Patient Care Team: Baxter Hire, MD as PCP - General (Internal Medicine) Emmaline Kluver., MD (Rheumatology) Dasher, Rayvon Char, MD (Dermatology) Cammie Sickle, MD as Consulting Physician (Hematology and Oncology)  CHIEF COMPLAINTS/PURPOSE OF CONSULTATION: Autoimmune neutropenia  #April 2019- Autoimmune neutropenia-weekly Granix/CBC [Dr.Crocoran]; BMBx- no malignancy [Bone marrow aspirate and biopsy on 09/04/2017 revealed a hypercellular marrow for age with trilineage hematopoiesis.  There was abundant mature neutrophils.  Significant dyspoiesis or increased blasts was not identified.  Flow cytometry revealed a predominance of T lymphocytes (16% of all cells) with no abnormal phenotype.  There was a minor B-cell population (13% of lymphocytes) with slight kappa light chain excess.  Cytogenetics revealed 42, XX, del(20)(q11.2)[2] / 46,XX[18].]; weekly cbc/granix [until July 2020]; Aug 2020- Zarxio;  June 2021-prednisone 10 mg a day; response noted' Ju;y 13th 2021- --prednisone 5 mg a day  #   temporal arteritis [2003] chronic prednisone/ 2.5 mg a day; Dr.Kernodle; SEP 2021- Prednisone 5mg  [refill]  Oncology History   No history exists.    HISTORY OF PRESENTING ILLNESS:  [name] 85 y.o.  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  Patient denies any recent infections.  No hospitalizations.  No need for antibiotics but no fevers or chills.  Continues to have easy bruising.  No gum bleeding or rectal bleeding.   Review of Systems  Constitutional: Negative for chills, diaphoresis, fever, malaise/fatigue and weight loss.  HENT: Negative for nosebleeds and sore throat.   Eyes: Negative for double vision.  Respiratory: Negative for cough, hemoptysis, sputum production, shortness of breath and wheezing.   Cardiovascular: Positive for leg swelling. Negative for chest pain,  palpitations and orthopnea.  Gastrointestinal: Negative for blood in stool, constipation, diarrhea, heartburn, melena, nausea and vomiting.       Around parastomal hernia.  Genitourinary: Negative for dysuria, frequency and urgency.  Musculoskeletal: Negative for back pain and joint pain.  Skin: Negative for itching.  Neurological: Positive for tingling. Negative for dizziness, focal weakness, weakness and headaches.  Endo/Heme/Allergies: Bruises/bleeds easily.  Psychiatric/Behavioral: Negative for depression. The patient is nervous/anxious. The patient does not have insomnia.      MEDICAL HISTORY:  Past Medical History:  Diagnosis Date  . Breast cancer ([place]) 1985   bilateral mastectomies  . Depression    1 time specific situation that she did not know how to deal wiht  . Diverticulosis   . Hiatal hernia   . History of Bell's palsy   . History of colon polyps   . History of nephrolithiasis   . History of pancreatitis   . Hyperlipidemia   . IBS (irritable bowel syndrome)   . Osteoporosis    osteopenia in neck  . Skin cancer   . Temporal arteritis ([place])     SURGICAL HISTORY: Past Surgical History:  Procedure Laterality Date  . AUGMENTATION MAMMAPLASTY Bilateral 2000  . COLON RESECTION SIGMOID N/A 10/13/2017   Procedure: COLON RESECTION SIGMOID;  Surgeon: Herbert Pun, MD;  Location: [facility] ORS;  Service: General;  Laterality: N/A;  . COLOSTOMY N/A 10/13/2017   Procedure: COLOSTOMY;  Surgeon: Herbert Pun, MD;  Location: [facility] ORS;  Service: General;  Laterality: N/A;  . LAPAROSCOPIC CHOLECYSTECTOMY  2009  . MASTECTOMY  1983   bilateral    SOCIAL HISTORY: Social History   Socioeconomic History  . Marital status: Widowed    Spouse name: Not on file  . Number of  children: 2  . Years of education: college  . Highest education level: Not on file  Occupational History  . Occupation: Engineer, production  . Occupation: Retired  Tobacco Use  . Smoking status:  Former Smoker    Packs/day: 0.25    Years: 30.00    Pack years: 7.50    Types: Cigarettes    Quit date: 05/17/2005    Years since quitting: 14.9  . Smokeless tobacco: Never Used  . Tobacco comment: quit 11 years ago  Vaping Use  . Vaping Use: Never used  Substance and Sexual Activity  . Alcohol use: No    Alcohol/week: 0.0 standard drinks  . Drug use: No  . Sexual activity: Not Currently  Other Topics Concern  . Not on file  Social History Narrative   Patient lives at home alone.    Patient is retired.    Patient has some college.    Patient has 2 children.   Social Determinants of Health   Financial Resource Strain:   . Difficulty of Paying Living Expenses: Not on file  Food Insecurity:   . Worried About Charity fundraiser in the Last Year: Not on file  . Ran Out of Food in the Last Year: Not on file  Transportation Needs:   . Lack of Transportation (Medical): Not on file  . Lack of Transportation (Non-Medical): Not on file  Physical Activity:   . Days of Exercise per Week: Not on file  . Minutes of Exercise per Session: Not on file  Stress:   . Feeling of Stress : Not on file  Social Connections:   . Frequency of Communication with Friends and Family: Not on file  . Frequency of Social Gatherings with Friends and Family: Not on file  . Attends Religious Services: Not on file  . Active Member of Clubs or Organizations: Not on file  . Attends Archivist Meetings: Not on file  . Marital Status: Not on file  Intimate Partner Violence:   . Fear of Current or Ex-Partner: Not on file  . Emotionally Abused: Not on file  . Physically Abused: Not on file  . Sexually Abused: Not on file    FAMILY HISTORY: Family History  Problem Relation Age of Onset  . Colon cancer Mother 47  . Scleroderma Father 12  . Alcoholism Brother   . Stomach cancer Neg Hx     ALLERGIES:  is allergic to morphine, benzalkonium chloride, escitalopram, hydralazine,  neomycin-bacitracin zn-polymyx, and bacitracin-neomycin-polymyxin.  MEDICATIONS:  Current Outpatient Medications  Medication Sig Dispense Refill  . ALPRAZolam (XANAX) 0.25 MG tablet Take 0.25 mg by mouth 2 (two) times daily as needed.     Marland Kitchen amLODipine (NORVASC) 5 MG tablet Take 2.5 mg by mouth.     . Ascorbic Acid (VITAMIN C) 1000 MG tablet Take 1,000 mg by mouth daily.    Marland Kitchen aspirin EC 81 MG tablet Take 81 mg daily by mouth.    . Biotin 1000 MCG tablet Take 1,000 mcg by mouth daily.     . Cholecalciferol (VITAMIN D3) 2000 units capsule Take 2,000 Units daily by mouth.     . Cyanocobalamin (B-12 PO) Take 2,500 mcg daily by mouth.     Karle Barr (ZARXIO) 480 MCG/0.8ML SOSY injection Inject 0.8 mLs (480 mcg total) into the skin once a week. Administer injection subcutaneously 3.2 mL 6  . predniSONE (DELTASONE) 5 MG tablet Take 2.5 mg by mouth daily.     . traMADol Veatrice Bourbon)  50 MG tablet Take 50 mg by mouth daily.     Marland Kitchen ZINC OXIDE PO Take 1 tablet by mouth daily.     No current facility-administered medications for this visit.    PHYSICAL EXAMINATION: ECOG PERFORMANCE STATUS: 0 - Asymptomatic  Vitals:   04/13/20 0932  BP: (!) 187/64  Pulse: 85  Temp: 97.8 F (36.6 C)  SpO2: 100%   Filed Weights   04/13/20 0932  Weight: 173 lb 9.6 oz (78.7 kg)    Physical Exam Constitutional:      Comments: Alone.  HENT:     Head: Normocephalic and atraumatic.     Mouth/Throat:     Pharynx: No oropharyngeal exudate.  Eyes:     Pupils: Pupils are equal, round, and reactive to light.  Cardiovascular:     Rate and Rhythm: Normal rate and regular rhythm.  Pulmonary:     Effort: No respiratory distress.     Breath sounds: No wheezing.  Abdominal:     General: Bowel sounds are normal. There is no distension.     Palpations: Abdomen is soft. There is no mass.     Tenderness: There is no abdominal tenderness. There is no guarding or rebound.     Comments: Colostomy.  Parastomal hernia.   Musculoskeletal:        General: No tenderness. Normal range of motion.     Cervical back: Normal range of motion and neck supple.  Skin:    General: Skin is warm.     Comments: Hyperpigmented rash noted bilateral shins.  Neurological:     Mental Status: She is alert and oriented to person, place, and time.  Psychiatric:        Mood and Affect: Affect normal.     Comments: Anxious.      LABORATORY DATA:  I have reviewed the data as listed Lab Results  Component Value Date   WBC 2.3 (L) 04/13/2020   HGB 14.3 04/13/2020   HCT 44.1 04/13/2020   MCV 91.9 04/13/2020   PLT 222 04/13/2020   Recent Labs    09/24/19 1916 09/24/19 1916 01/06/20 0936 02/17/20 0955 04/13/20 0909  NA 139   < > 140 140 141  K 4.1   < > 4.2 4.0 4.2  CL 101   < > 101 101 101  CO2 28   < > 28 28 28   GLUCOSE 91   < > 107* 96 111*  BUN 19   < > 19 15 17   CREATININE 1.03*   < > 0.89 1.04* 0.90  CALCIUM 9.3   < > 8.8* 8.6* 9.2  GFRNONAA 50*   < > 60* 50* >60  GFRAA 58*  --  >60 58*  --    < > = values in this interval not displayed.    RADIOGRAPHIC STUDIES: I have personally reviewed the radiological images as listed and agreed with the findings in the report. No results found.  ASSESSMENT & PLAN:   Autoimmune neutropenia (HCC) # Autoimmune neutropenia- once a week- if ANC < 1.5- zaxio-patient's WBC- today-2.3; ANC- 0.6;  Continue zarxio today; otherwise continue weekly zarxio.  Patient clinically asymptomatic.  Continue cbc/zarxioweekly/pt pref  # Prednisone 5 mg/day [will fill]; continue at current dose.   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   # Elevated blood pressure ; no history of [id number] systolic-likely situational/anxiety. STABLE.   # Easy burising: sec to prednisone. STABLE.    # DISPOSITION: # proceed  with [facility] today # weekly cbc/zaxio x 8 # follow up with MD in 8  weeks- cbc/bmp-zarxio-Dr.B  All questions were answered. The  patient knows to call the clinic with any problems, questions or concerns.    Cammie Sickle, MD 04/13/2020 10:37 AM

## 2020-04-20 ENCOUNTER — Inpatient Hospital Stay: Payer: Medicare Other

## 2020-04-20 DIAGNOSIS — D708 Other neutropenia: Secondary | ICD-10-CM | POA: Diagnosis not present

## 2020-04-20 DIAGNOSIS — D72819 Decreased white blood cell count, unspecified: Secondary | ICD-10-CM

## 2020-04-20 LAB — CBC WITH DIFFERENTIAL/PLATELET
Abs Immature Granulocytes: 0.05 10*3/uL (ref 0.00–0.07)
Basophils Absolute: 0 10*3/uL (ref 0.0–0.1)
Basophils Relative: 1 %
Eosinophils Absolute: 0 10*3/uL (ref 0.0–0.5)
Eosinophils Relative: 1 %
HCT: 46.3 % — ABNORMAL HIGH (ref 36.0–46.0)
Hemoglobin: 14.4 g/dL (ref 12.0–15.0)
Immature Granulocytes: 2 %
Lymphocytes Relative: 33 %
Lymphs Abs: 0.7 10*3/uL (ref 0.7–4.0)
MCH: 29.3 pg (ref 26.0–34.0)
MCHC: 31.1 g/dL (ref 30.0–36.0)
MCV: 94.1 fL (ref 80.0–100.0)
Monocytes Absolute: 0.3 10*3/uL (ref 0.1–1.0)
Monocytes Relative: 12 %
Neutro Abs: 1.1 10*3/uL — ABNORMAL LOW (ref 1.7–7.7)
Neutrophils Relative %: 51 %
Platelets: 199 10*3/uL (ref 150–400)
RBC: 4.92 MIL/uL (ref 3.87–5.11)
RDW: 15.8 % — ABNORMAL HIGH (ref 11.5–15.5)
WBC: 2.1 10*3/uL — ABNORMAL LOW (ref 4.0–10.5)
nRBC: 0 % (ref 0.0–0.2)

## 2020-04-20 MED ORDER — FILGRASTIM-SNDZ 480 MCG/0.8ML IJ SOSY
480.0000 ug | PREFILLED_SYRINGE | Freq: Once | INTRAMUSCULAR | Status: AC
  Administered 2020-04-20: 480 ug via SUBCUTANEOUS
  Filled 2020-04-20: qty 0.8

## 2020-04-27 ENCOUNTER — Inpatient Hospital Stay: Payer: Medicare Other

## 2020-04-27 ENCOUNTER — Other Ambulatory Visit: Payer: Self-pay

## 2020-04-27 DIAGNOSIS — D708 Other neutropenia: Secondary | ICD-10-CM | POA: Diagnosis not present

## 2020-04-27 DIAGNOSIS — D72819 Decreased white blood cell count, unspecified: Secondary | ICD-10-CM

## 2020-04-27 LAB — CBC WITH DIFFERENTIAL/PLATELET
Abs Immature Granulocytes: 0.02 10*3/uL (ref 0.00–0.07)
Basophils Absolute: 0 10*3/uL (ref 0.0–0.1)
Basophils Relative: 0 %
Eosinophils Absolute: 0.1 10*3/uL (ref 0.0–0.5)
Eosinophils Relative: 3 %
HCT: 44.6 % (ref 36.0–46.0)
Hemoglobin: 14 g/dL (ref 12.0–15.0)
Immature Granulocytes: 1 %
Lymphocytes Relative: 49 %
Lymphs Abs: 1.2 10*3/uL (ref 0.7–4.0)
MCH: 29.7 pg (ref 26.0–34.0)
MCHC: 31.4 g/dL (ref 30.0–36.0)
MCV: 94.7 fL (ref 80.0–100.0)
Monocytes Absolute: 0.3 10*3/uL (ref 0.1–1.0)
Monocytes Relative: 14 %
Neutro Abs: 0.8 10*3/uL — ABNORMAL LOW (ref 1.7–7.7)
Neutrophils Relative %: 33 %
Platelets: 185 10*3/uL (ref 150–400)
RBC: 4.71 MIL/uL (ref 3.87–5.11)
RDW: 15.8 % — ABNORMAL HIGH (ref 11.5–15.5)
Smear Review: NORMAL
WBC: 2.4 10*3/uL — ABNORMAL LOW (ref 4.0–10.5)
nRBC: 0 % (ref 0.0–0.2)

## 2020-04-27 MED ORDER — FILGRASTIM-SNDZ 480 MCG/0.8ML IJ SOSY
480.0000 ug | PREFILLED_SYRINGE | Freq: Once | INTRAMUSCULAR | Status: AC
  Administered 2020-04-27: 480 ug via SUBCUTANEOUS
  Filled 2020-04-27: qty 0.8

## 2020-05-04 ENCOUNTER — Inpatient Hospital Stay: Payer: Medicare Other | Attending: Internal Medicine

## 2020-05-04 ENCOUNTER — Inpatient Hospital Stay: Payer: Medicare Other

## 2020-05-04 DIAGNOSIS — M316 Other giant cell arteritis: Secondary | ICD-10-CM | POA: Diagnosis not present

## 2020-05-04 DIAGNOSIS — Z79899 Other long term (current) drug therapy: Secondary | ICD-10-CM | POA: Insufficient documentation

## 2020-05-04 DIAGNOSIS — Z811 Family history of alcohol abuse and dependence: Secondary | ICD-10-CM | POA: Diagnosis not present

## 2020-05-04 DIAGNOSIS — D72819 Decreased white blood cell count, unspecified: Secondary | ICD-10-CM

## 2020-05-04 DIAGNOSIS — Z800 Family history of malignant neoplasm of digestive organs: Secondary | ICD-10-CM | POA: Diagnosis not present

## 2020-05-04 DIAGNOSIS — D708 Other neutropenia: Secondary | ICD-10-CM | POA: Diagnosis not present

## 2020-05-04 DIAGNOSIS — R202 Paresthesia of skin: Secondary | ICD-10-CM | POA: Diagnosis not present

## 2020-05-04 DIAGNOSIS — Z840 Family history of diseases of the skin and subcutaneous tissue: Secondary | ICD-10-CM | POA: Diagnosis not present

## 2020-05-04 DIAGNOSIS — R609 Edema, unspecified: Secondary | ICD-10-CM | POA: Diagnosis not present

## 2020-05-04 DIAGNOSIS — Z87891 Personal history of nicotine dependence: Secondary | ICD-10-CM | POA: Insufficient documentation

## 2020-05-04 LAB — CBC WITH DIFFERENTIAL/PLATELET
Abs Immature Granulocytes: 0.16 10*3/uL — ABNORMAL HIGH (ref 0.00–0.07)
Basophils Absolute: 0 10*3/uL (ref 0.0–0.1)
Basophils Relative: 1 %
Eosinophils Absolute: 0 10*3/uL (ref 0.0–0.5)
Eosinophils Relative: 1 %
HCT: 45 % (ref 36.0–46.0)
Hemoglobin: 14.4 g/dL (ref 12.0–15.0)
Immature Granulocytes: 6 %
Lymphocytes Relative: 26 %
Lymphs Abs: 0.7 10*3/uL (ref 0.7–4.0)
MCH: 29.9 pg (ref 26.0–34.0)
MCHC: 32 g/dL (ref 30.0–36.0)
MCV: 93.4 fL (ref 80.0–100.0)
Monocytes Absolute: 0.3 10*3/uL (ref 0.1–1.0)
Monocytes Relative: 11 %
Neutro Abs: 1.4 10*3/uL — ABNORMAL LOW (ref 1.7–7.7)
Neutrophils Relative %: 55 %
Platelets: 194 10*3/uL (ref 150–400)
RBC: 4.82 MIL/uL (ref 3.87–5.11)
RDW: 15.5 % (ref 11.5–15.5)
Smear Review: ADEQUATE
WBC: 2.6 10*3/uL — ABNORMAL LOW (ref 4.0–10.5)
nRBC: 0 % (ref 0.0–0.2)

## 2020-05-04 MED ORDER — FILGRASTIM-SNDZ 480 MCG/0.8ML IJ SOSY
480.0000 ug | PREFILLED_SYRINGE | Freq: Once | INTRAMUSCULAR | Status: AC
  Administered 2020-05-04: 480 ug via SUBCUTANEOUS
  Filled 2020-05-04: qty 0.8

## 2020-05-11 ENCOUNTER — Inpatient Hospital Stay: Payer: Medicare Other

## 2020-05-11 DIAGNOSIS — D708 Other neutropenia: Secondary | ICD-10-CM

## 2020-05-11 DIAGNOSIS — D72819 Decreased white blood cell count, unspecified: Secondary | ICD-10-CM

## 2020-05-11 LAB — CBC WITH DIFFERENTIAL/PLATELET
Abs Immature Granulocytes: 0.01 10*3/uL (ref 0.00–0.07)
Basophils Absolute: 0 10*3/uL (ref 0.0–0.1)
Basophils Relative: 0 %
Eosinophils Absolute: 0 10*3/uL (ref 0.0–0.5)
Eosinophils Relative: 1 %
HCT: 44.2 % (ref 36.0–46.0)
Hemoglobin: 14 g/dL (ref 12.0–15.0)
Immature Granulocytes: 0 %
Lymphocytes Relative: 30 %
Lymphs Abs: 0.7 10*3/uL (ref 0.7–4.0)
MCH: 29.7 pg (ref 26.0–34.0)
MCHC: 31.7 g/dL (ref 30.0–36.0)
MCV: 93.6 fL (ref 80.0–100.0)
Monocytes Absolute: 0.3 10*3/uL (ref 0.1–1.0)
Monocytes Relative: 12 %
Neutro Abs: 1.3 10*3/uL — ABNORMAL LOW (ref 1.7–7.7)
Neutrophils Relative %: 57 %
Platelets: 212 10*3/uL (ref 150–400)
RBC: 4.72 MIL/uL (ref 3.87–5.11)
RDW: 15.2 % (ref 11.5–15.5)
Smear Review: NORMAL
WBC: 2.3 10*3/uL — ABNORMAL LOW (ref 4.0–10.5)
nRBC: 0 % (ref 0.0–0.2)

## 2020-05-11 LAB — BASIC METABOLIC PANEL
Anion gap: 12 (ref 5–15)
BUN: 17 mg/dL (ref 8–23)
CO2: 27 mmol/L (ref 22–32)
Calcium: 8.9 mg/dL (ref 8.9–10.3)
Chloride: 99 mmol/L (ref 98–111)
Creatinine, Ser: 1.05 mg/dL — ABNORMAL HIGH (ref 0.44–1.00)
GFR, Estimated: 53 mL/min — ABNORMAL LOW (ref >60)
Glucose, Bld: 99 mg/dL (ref 70–99)
Potassium: 4.1 mmol/L (ref 3.5–5.1)
Sodium: 138 mmol/L (ref 135–145)

## 2020-05-11 MED ORDER — FILGRASTIM-SNDZ 480 MCG/0.8ML IJ SOSY
480.0000 ug | PREFILLED_SYRINGE | Freq: Once | INTRAMUSCULAR | Status: AC
  Administered 2020-05-11: 11:00:00 480 ug via SUBCUTANEOUS
  Filled 2020-05-11: qty 0.8

## 2020-05-18 ENCOUNTER — Inpatient Hospital Stay: Payer: Medicare Other

## 2020-05-18 DIAGNOSIS — D708 Other neutropenia: Secondary | ICD-10-CM

## 2020-05-18 DIAGNOSIS — D72819 Decreased white blood cell count, unspecified: Secondary | ICD-10-CM

## 2020-05-18 LAB — CBC WITH DIFFERENTIAL/PLATELET
Abs Immature Granulocytes: 0.1 10*3/uL — ABNORMAL HIGH (ref 0.00–0.07)
Basophils Absolute: 0 10*3/uL (ref 0.0–0.1)
Basophils Relative: 1 %
Eosinophils Absolute: 0 10*3/uL (ref 0.0–0.5)
Eosinophils Relative: 1 %
HCT: 44.7 % (ref 36.0–46.0)
Hemoglobin: 14.2 g/dL (ref 12.0–15.0)
Immature Granulocytes: 4 %
Lymphocytes Relative: 30 %
Lymphs Abs: 0.7 10*3/uL (ref 0.7–4.0)
MCH: 29.9 pg (ref 26.0–34.0)
MCHC: 31.8 g/dL (ref 30.0–36.0)
MCV: 94.1 fL (ref 80.0–100.0)
Monocytes Absolute: 0.3 10*3/uL (ref 0.1–1.0)
Monocytes Relative: 10 %
Neutro Abs: 1.3 10*3/uL — ABNORMAL LOW (ref 1.7–7.7)
Neutrophils Relative %: 54 %
Platelets: 194 10*3/uL (ref 150–400)
RBC: 4.75 MIL/uL (ref 3.87–5.11)
RDW: 15.2 % (ref 11.5–15.5)
Smear Review: NORMAL
WBC: 2.4 10*3/uL — ABNORMAL LOW (ref 4.0–10.5)
nRBC: 0 % (ref 0.0–0.2)

## 2020-05-18 MED ORDER — FILGRASTIM-SNDZ 480 MCG/0.8ML IJ SOSY
480.0000 ug | PREFILLED_SYRINGE | Freq: Once | INTRAMUSCULAR | Status: AC
  Administered 2020-05-18: 11:00:00 480 ug via SUBCUTANEOUS
  Filled 2020-05-18: qty 0.8

## 2020-05-25 ENCOUNTER — Inpatient Hospital Stay: Payer: Medicare Other

## 2020-05-25 DIAGNOSIS — D708 Other neutropenia: Secondary | ICD-10-CM

## 2020-05-25 DIAGNOSIS — D72819 Decreased white blood cell count, unspecified: Secondary | ICD-10-CM

## 2020-05-25 LAB — CBC WITH DIFFERENTIAL/PLATELET
Abs Immature Granulocytes: 0.13 10*3/uL — ABNORMAL HIGH (ref 0.00–0.07)
Basophils Absolute: 0 10*3/uL (ref 0.0–0.1)
Basophils Relative: 1 %
Eosinophils Absolute: 0 10*3/uL (ref 0.0–0.5)
Eosinophils Relative: 1 %
HCT: 45.7 % (ref 36.0–46.0)
Hemoglobin: 14.1 g/dL (ref 12.0–15.0)
Immature Granulocytes: 5 %
Lymphocytes Relative: 39 %
Lymphs Abs: 1.1 10*3/uL (ref 0.7–4.0)
MCH: 29.3 pg (ref 26.0–34.0)
MCHC: 30.9 g/dL (ref 30.0–36.0)
MCV: 95 fL (ref 80.0–100.0)
Monocytes Absolute: 0.3 10*3/uL (ref 0.1–1.0)
Monocytes Relative: 11 %
Neutro Abs: 1.3 10*3/uL — ABNORMAL LOW (ref 1.7–7.7)
Neutrophils Relative %: 43 %
Platelets: 185 10*3/uL (ref 150–400)
RBC: 4.81 MIL/uL (ref 3.87–5.11)
RDW: 15.4 % (ref 11.5–15.5)
Smear Review: NORMAL
WBC: 2.9 10*3/uL — ABNORMAL LOW (ref 4.0–10.5)
nRBC: 0 % (ref 0.0–0.2)

## 2020-05-25 MED ORDER — FILGRASTIM-SNDZ 480 MCG/0.8ML IJ SOSY
480.0000 ug | PREFILLED_SYRINGE | Freq: Once | INTRAMUSCULAR | Status: AC
  Administered 2020-05-25: 12:00:00 480 ug via SUBCUTANEOUS
  Filled 2020-05-25: qty 0.8

## 2020-06-01 ENCOUNTER — Inpatient Hospital Stay: Payer: Medicare Other | Attending: Internal Medicine

## 2020-06-01 ENCOUNTER — Inpatient Hospital Stay: Payer: Medicare Other

## 2020-06-01 DIAGNOSIS — Z7952 Long term (current) use of systemic steroids: Secondary | ICD-10-CM | POA: Diagnosis not present

## 2020-06-01 DIAGNOSIS — Z853 Personal history of malignant neoplasm of breast: Secondary | ICD-10-CM | POA: Diagnosis not present

## 2020-06-01 DIAGNOSIS — T380X5A Adverse effect of glucocorticoids and synthetic analogues, initial encounter: Secondary | ICD-10-CM | POA: Insufficient documentation

## 2020-06-01 DIAGNOSIS — R233 Spontaneous ecchymoses: Secondary | ICD-10-CM | POA: Diagnosis not present

## 2020-06-01 DIAGNOSIS — R030 Elevated blood-pressure reading, without diagnosis of hypertension: Secondary | ICD-10-CM | POA: Insufficient documentation

## 2020-06-01 DIAGNOSIS — Z9013 Acquired absence of bilateral breasts and nipples: Secondary | ICD-10-CM | POA: Insufficient documentation

## 2020-06-01 DIAGNOSIS — R202 Paresthesia of skin: Secondary | ICD-10-CM | POA: Insufficient documentation

## 2020-06-01 DIAGNOSIS — Z811 Family history of alcohol abuse and dependence: Secondary | ICD-10-CM | POA: Insufficient documentation

## 2020-06-01 DIAGNOSIS — Z87891 Personal history of nicotine dependence: Secondary | ICD-10-CM | POA: Diagnosis not present

## 2020-06-01 DIAGNOSIS — Z8719 Personal history of other diseases of the digestive system: Secondary | ICD-10-CM | POA: Insufficient documentation

## 2020-06-01 DIAGNOSIS — I776 Arteritis, unspecified: Secondary | ICD-10-CM | POA: Insufficient documentation

## 2020-06-01 DIAGNOSIS — Z87442 Personal history of urinary calculi: Secondary | ICD-10-CM | POA: Diagnosis not present

## 2020-06-01 DIAGNOSIS — D708 Other neutropenia: Secondary | ICD-10-CM | POA: Insufficient documentation

## 2020-06-01 DIAGNOSIS — D72819 Decreased white blood cell count, unspecified: Secondary | ICD-10-CM

## 2020-06-01 DIAGNOSIS — Z885 Allergy status to narcotic agent status: Secondary | ICD-10-CM | POA: Diagnosis not present

## 2020-06-01 DIAGNOSIS — Z800 Family history of malignant neoplasm of digestive organs: Secondary | ICD-10-CM | POA: Insufficient documentation

## 2020-06-01 DIAGNOSIS — Z79899 Other long term (current) drug therapy: Secondary | ICD-10-CM | POA: Insufficient documentation

## 2020-06-01 DIAGNOSIS — M7989 Other specified soft tissue disorders: Secondary | ICD-10-CM | POA: Diagnosis not present

## 2020-06-01 DIAGNOSIS — Z85828 Personal history of other malignant neoplasm of skin: Secondary | ICD-10-CM | POA: Diagnosis not present

## 2020-06-01 DIAGNOSIS — Z808 Family history of malignant neoplasm of other organs or systems: Secondary | ICD-10-CM | POA: Diagnosis not present

## 2020-06-01 DIAGNOSIS — Z87440 Personal history of urinary (tract) infections: Secondary | ICD-10-CM | POA: Insufficient documentation

## 2020-06-01 DIAGNOSIS — Z9049 Acquired absence of other specified parts of digestive tract: Secondary | ICD-10-CM | POA: Insufficient documentation

## 2020-06-01 LAB — CBC WITH DIFFERENTIAL/PLATELET
Abs Immature Granulocytes: 0.01 10*3/uL (ref 0.00–0.07)
Basophils Absolute: 0 10*3/uL (ref 0.0–0.1)
Basophils Relative: 0 %
Eosinophils Absolute: 0 10*3/uL (ref 0.0–0.5)
Eosinophils Relative: 1 %
HCT: 44.2 % (ref 36.0–46.0)
Hemoglobin: 14.1 g/dL (ref 12.0–15.0)
Immature Granulocytes: 0 %
Lymphocytes Relative: 28 %
Lymphs Abs: 0.7 10*3/uL (ref 0.7–4.0)
MCH: 29.7 pg (ref 26.0–34.0)
MCHC: 31.9 g/dL (ref 30.0–36.0)
MCV: 93.2 fL (ref 80.0–100.0)
Monocytes Absolute: 0.2 10*3/uL (ref 0.1–1.0)
Monocytes Relative: 9 %
Neutro Abs: 1.6 10*3/uL — ABNORMAL LOW (ref 1.7–7.7)
Neutrophils Relative %: 62 %
Platelets: 195 10*3/uL (ref 150–400)
RBC: 4.74 MIL/uL (ref 3.87–5.11)
RDW: 15 % (ref 11.5–15.5)
Smear Review: NORMAL
WBC: 2.6 10*3/uL — ABNORMAL LOW (ref 4.0–10.5)
nRBC: 0 % (ref 0.0–0.2)

## 2020-06-04 ENCOUNTER — Other Ambulatory Visit: Payer: Self-pay

## 2020-06-04 DIAGNOSIS — D708 Other neutropenia: Secondary | ICD-10-CM

## 2020-06-08 ENCOUNTER — Other Ambulatory Visit: Payer: Self-pay

## 2020-06-08 ENCOUNTER — Inpatient Hospital Stay: Payer: Medicare Other

## 2020-06-08 ENCOUNTER — Encounter: Payer: Self-pay | Admitting: Internal Medicine

## 2020-06-08 ENCOUNTER — Inpatient Hospital Stay (HOSPITAL_BASED_OUTPATIENT_CLINIC_OR_DEPARTMENT_OTHER): Payer: Medicare Other | Admitting: Internal Medicine

## 2020-06-08 DIAGNOSIS — D708 Other neutropenia: Secondary | ICD-10-CM

## 2020-06-08 DIAGNOSIS — D72819 Decreased white blood cell count, unspecified: Secondary | ICD-10-CM

## 2020-06-08 LAB — BASIC METABOLIC PANEL
Anion gap: 10 (ref 5–15)
BUN: 22 mg/dL (ref 8–23)
CO2: 30 mmol/L (ref 22–32)
Calcium: 9 mg/dL (ref 8.9–10.3)
Chloride: 98 mmol/L (ref 98–111)
Creatinine, Ser: 1.02 mg/dL — ABNORMAL HIGH (ref 0.44–1.00)
GFR, Estimated: 55 mL/min — ABNORMAL LOW (ref >60)
Glucose, Bld: 125 mg/dL — ABNORMAL HIGH (ref 70–99)
Potassium: 4.1 mmol/L (ref 3.5–5.1)
Sodium: 138 mmol/L (ref 135–145)

## 2020-06-08 LAB — CBC WITH DIFFERENTIAL/PLATELET
Abs Immature Granulocytes: 0.04 10*3/uL (ref 0.00–0.07)
Basophils Absolute: 0 10*3/uL (ref 0.0–0.1)
Basophils Relative: 1 %
Eosinophils Absolute: 0 10*3/uL (ref 0.0–0.5)
Eosinophils Relative: 2 %
HCT: 45.8 % (ref 36.0–46.0)
Hemoglobin: 14.5 g/dL (ref 12.0–15.0)
Immature Granulocytes: 2 %
Lymphocytes Relative: 43 %
Lymphs Abs: 1.1 10*3/uL (ref 0.7–4.0)
MCH: 29.8 pg (ref 26.0–34.0)
MCHC: 31.7 g/dL (ref 30.0–36.0)
MCV: 94.2 fL (ref 80.0–100.0)
Monocytes Absolute: 0.3 10*3/uL (ref 0.1–1.0)
Monocytes Relative: 11 %
Neutro Abs: 1 10*3/uL — ABNORMAL LOW (ref 1.7–7.7)
Neutrophils Relative %: 41 %
Platelets: 221 10*3/uL (ref 150–400)
RBC: 4.86 MIL/uL (ref 3.87–5.11)
RDW: 14.8 % (ref 11.5–15.5)
WBC: 2.4 10*3/uL — ABNORMAL LOW (ref 4.0–10.5)
nRBC: 0 % (ref 0.0–0.2)

## 2020-06-08 MED ORDER — FILGRASTIM-SNDZ 480 MCG/0.8ML IJ SOSY
480.0000 ug | PREFILLED_SYRINGE | Freq: Once | INTRAMUSCULAR | Status: AC
  Administered 2020-06-08: 480 ug via SUBCUTANEOUS
  Filled 2020-06-08: qty 0.8

## 2020-06-08 NOTE — Progress Notes (Unsigned)
Currently on amoxicillin for a UTI.

## 2020-06-08 NOTE — Assessment & Plan Note (Addendum)
#   Autoimmune neutropenia- once a week- if ANC < 1.5- zaxio-patient's WBC- today-2.3; ANC- 0.6;  Continue zarxio today; otherwise continue weekly zarxio.  Patient clinically asymptomatic.  Continue cbc/zarxioweekly/pt pref. Stable.  # Prednisone 5 mg/day [will fill]; continue at current dose.   # Hx of temporal arteritis->20 years on low dose prednisone-stable  # Elevated blood pressure ; no history of [id number] systolic-likely situational/anxiety. STABLE.   # Easy burising: sec to prednisone. STABLE.   # UTI- on amoxicillin. Stable  # DISPOSITION: # proceed with ZARXIO today # weekly cbc/zaxio x 8 # follow up with MD in 8  weeks- cbc/bmp-zarxio-Dr.B

## 2020-06-08 NOTE — Progress Notes (Unsigned)
[facility] NOTE  Patient Care Team: Baxter Hire, MD as PCP - General (Internal Medicine) Emmaline Kluver., MD (Rheumatology) Dasher, Rayvon Char, MD (Dermatology) Cammie Sickle, MD as Consulting Physician (Hematology and Oncology)  CHIEF COMPLAINTS/PURPOSE OF CONSULTATION: Autoimmune neutropenia  #April 2019- Autoimmune neutropenia-weekly Granix/CBC [Dr.Crocoran]; BMBx- no malignancy [Bone marrow aspirate and biopsy on 09/04/2017 revealed a hypercellular marrow for age with trilineage hematopoiesis.  There was abundant mature neutrophils.  Significant dyspoiesis or increased blasts was not identified.  Flow cytometry revealed a predominance of T lymphocytes (16% of all cells) with no abnormal phenotype.  There was a minor B-cell population (13% of lymphocytes) with slight kappa light chain excess.  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    HISTORY OF PRESENTING ILLNESS:  [name] 86 y.o.  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  Recent UTI on amoxicillin- currently resolved. Otherwise denies any recent hospitalizations. No fever no chills. Easy bruising.  Review of Systems  Constitutional: Negative for chills, diaphoresis, fever, malaise/fatigue and weight loss.  HENT: Negative for nosebleeds and sore throat.   Eyes: Negative for double vision.  Respiratory: Negative for cough, hemoptysis, sputum production, shortness of breath and wheezing.   Cardiovascular: Positive for leg swelling. Negative for chest pain, palpitations and orthopnea.  Gastrointestinal:  Negative for blood in stool, constipation, diarrhea, heartburn, melena, nausea and vomiting.       Around parastomal hernia.  Genitourinary: Negative for dysuria, frequency and urgency.  Musculoskeletal: Negative for back pain and joint pain.  Skin: Negative for itching.  Neurological: Positive for tingling. Negative for dizziness, focal weakness, weakness and headaches.  Endo/Heme/Allergies: Bruises/bleeds easily.  Psychiatric/Behavioral: Negative for depression. The patient is nervous/anxious. The patient does not have insomnia.      MEDICAL HISTORY:  Past Medical History:  Diagnosis Date  . Breast cancer ([place]) 1985   bilateral mastectomies  . Depression    1 time specific situation that she did not know how to deal wiht  . Diverticulosis   . Hiatal hernia   . History of Bell's palsy   . History of colon polyps   . History of nephrolithiasis   . History of pancreatitis   . Hyperlipidemia   . IBS (irritable bowel syndrome)   . Osteoporosis    osteopenia in neck  . Skin cancer   . Temporal arteritis ([place])     SURGICAL HISTORY: Past Surgical History:  Procedure Laterality Date  . AUGMENTATION MAMMAPLASTY Bilateral 2000  . COLON RESECTION SIGMOID N/A 10/13/2017   Procedure: COLON RESECTION SIGMOID;  Surgeon: Herbert Pun, MD;  Location: [facility] ORS;  Service: General;  Laterality: N/A;  . COLOSTOMY N/A 10/13/2017   Procedure: COLOSTOMY;  Surgeon: Herbert Pun, MD;  Location: [facility] ORS;  Service: General;  Laterality: N/A;  . LAPAROSCOPIC CHOLECYSTECTOMY  2009  . MASTECTOMY  1983   bilateral    SOCIAL HISTORY: Social History   Socioeconomic History  . Marital status: Widowed    Spouse name: Not on file  . Number of children: 2  . Years of education: college  . Highest education level: Not on  file  Occupational History  . Occupation: Engineer, production  . Occupation: Retired  Tobacco Use  . Smoking status: Former Smoker    Packs/day: 0.25    Years: 30.00     Pack years: 7.50    Types: Cigarettes    Quit date: 05/17/2005    Years since quitting: 15.0  . Smokeless tobacco: Never Used  . Tobacco comment: quit 11 years ago  Vaping Use  . Vaping Use: Never used  Substance and Sexual Activity  . Alcohol use: No    Alcohol/week: 0.0 standard drinks  . Drug use: No  . Sexual activity: Not Currently  Other Topics Concern  . Not on file  Social History Narrative   Patient lives at home alone.    Patient is retired.    Patient has some college.    Patient has 2 children.   Social Determinants of Health   Financial Resource Strain: Not on file  Food Insecurity: Not on file  Transportation Needs: Not on file  Physical Activity: Not on file  Stress: Not on file  Social Connections: Not on file  Intimate Partner Violence: Not on file    FAMILY HISTORY: Family History  Problem Relation Age of Onset  . Colon cancer Mother 41  . Scleroderma Father 69  . Alcoholism Brother   . Stomach cancer Neg Hx     ALLERGIES:  is allergic to morphine, benzalkonium chloride, escitalopram, hydralazine, neomycin-bacitracin zn-polymyx, and bacitracin-neomycin-polymyxin.  MEDICATIONS:  Current Outpatient Medications  Medication Sig Dispense Refill  . ALPRAZolam (XANAX) 0.25 MG tablet Take 0.25 mg by mouth 2 (two) times daily as needed.     Marland Kitchen amLODipine (NORVASC) 5 MG tablet Take 2.5 mg by mouth.     . Ascorbic Acid (VITAMIN C) 1000 MG tablet Take 1,000 mg by mouth daily.    Marland Kitchen aspirin EC 81 MG tablet Take 81 mg daily by mouth.    . Biotin 1000 MCG tablet Take 1,000 mcg by mouth daily.     . Cholecalciferol (VITAMIN D3) 2000 units capsule Take 2,000 Units by mouth daily.    . Cyanocobalamin (B-12 PO) Take 2,500 mcg daily by mouth.     Karle Barr (ZARXIO) 480 MCG/0.8ML SOSY injection Inject 0.8 mLs (480 mcg total) into the skin once a week. Administer injection subcutaneously 3.2 mL 6  . predniSONE (DELTASONE) 5 MG tablet Take 2.5 mg by mouth  daily.    . traMADol (ULTRAM) 50 MG tablet Take 50 mg by mouth daily.    Marland Kitchen ZINC OXIDE PO Take 1 tablet by mouth daily.    Marland Kitchen alendronate (FOSAMAX) 70 MG tablet Take 70 mg by mouth once a week.     No current facility-administered medications for this visit.    PHYSICAL EXAMINATION: ECOG PERFORMANCE STATUS: 0 - Asymptomatic  Vitals:   06/08/20 1057  BP: (!) 173/60  Pulse: 85  Resp: 16  Temp: (!) 97 F (36.1 C)  SpO2: 100%   Filed Weights   06/08/20 1057  Weight: 172 lb (78 kg)    Physical Exam Constitutional:      Comments: Alone.  HENT:     Head: Normocephalic and atraumatic.     Mouth/Throat:     Pharynx: No oropharyngeal exudate.  Eyes:     Pupils: Pupils are equal, round, and reactive to light.  Cardiovascular:     Rate and Rhythm: Normal rate and regular rhythm.  Pulmonary:     Effort: No respiratory distress.  Breath sounds: No wheezing.  Abdominal:     General: Bowel sounds are normal. There is no distension.     Palpations: Abdomen is soft. There is no mass.     Tenderness: There is no abdominal tenderness. There is no guarding or rebound.     Comments: Colostomy.  Parastomal hernia.  Musculoskeletal:        General: No tenderness. Normal range of motion.     Cervical back: Normal range of motion and neck supple.  Skin:    General: Skin is warm.     Comments: Hyperpigmented rash noted bilateral shins.  Neurological:     Mental Status: She is alert and oriented to person, place, and time.  Psychiatric:        Mood and Affect: Affect normal.     Comments: Anxious.      LABORATORY DATA:  I have reviewed the data as listed Lab Results  Component Value Date   WBC 2.4 (L) 06/08/2020   HGB 14.5 06/08/2020   HCT 45.8 06/08/2020   MCV 94.2 06/08/2020   PLT 221 06/08/2020   Recent Labs    09/24/19 1916 01/06/20 0936 02/17/20 0955 04/13/20 0909 05/11/20 1022 06/08/20 1038  NA 139 140 140 141 138 138  K 4.1 4.2 4.0 4.2 4.1 4.1  CL 101 101  101 101 99 98  CO2 28 28 28 28 27 30   GLUCOSE 91 107* 96 111* 99 125*  BUN 19 19 15 17 17 22   CREATININE 1.03* 0.89 1.04* 0.90 1.05* 1.02*  CALCIUM 9.3 8.8* 8.6* 9.2 8.9 9.0  GFRNONAA 50* 60* 50* >60 53* 55*  GFRAA 58* >60 58*  --   --   --     RADIOGRAPHIC STUDIES: I have personally reviewed the radiological images as listed and agreed with the findings in the report. No results found.  ASSESSMENT & PLAN:   Autoimmune neutropenia (HCC) # Autoimmune neutropenia- once a week- if ANC < 1.5- zaxio-patient's WBC- today-2.3; ANC- 0.6;  Continue zarxio today; otherwise continue weekly zarxio.  Patient clinically asymptomatic.  Continue cbc/zarxioweekly/pt pref. Stable.  # Prednisone 5 mg/day [will fill]; continue at current dose.   # Hx of temporal arteritis->20 years on low dose prednisone-stable  # Elevated blood pressure ; no history of [id number] systolic-likely situational/anxiety. STABLE.   # Easy burising: sec to prednisone. STABLE.   # UTI- on amoxicillin. Stable  # DISPOSITION: # proceed with ZARXIO today # weekly cbc/zaxio x 8 # follow up with MD in 8  weeks- cbc/bmp-zarxio-Dr.B  All questions were answered. The patient knows to call the clinic with any problems, questions or concerns.    Cammie Sickle, MD 06/09/2020 6:48 AM

## 2020-06-15 ENCOUNTER — Inpatient Hospital Stay: Payer: Medicare Other

## 2020-06-15 ENCOUNTER — Telehealth: Payer: Self-pay | Admitting: Internal Medicine

## 2020-06-15 NOTE — Telephone Encounter (Signed)
Pt called requesting to cancel appt for lab/injection on 1/18 due to traveling concerns. Rescheduled patient to 1/19 later afternoon so that her son could bring her.

## 2020-06-16 ENCOUNTER — Inpatient Hospital Stay: Payer: Medicare Other

## 2020-06-16 DIAGNOSIS — D72819 Decreased white blood cell count, unspecified: Secondary | ICD-10-CM

## 2020-06-16 DIAGNOSIS — D708 Other neutropenia: Secondary | ICD-10-CM

## 2020-06-16 LAB — CBC WITH DIFFERENTIAL/PLATELET
Abs Immature Granulocytes: 0.21 10*3/uL — ABNORMAL HIGH (ref 0.00–0.07)
Basophils Absolute: 0 10*3/uL (ref 0.0–0.1)
Basophils Relative: 0 %
Eosinophils Absolute: 0 10*3/uL (ref 0.0–0.5)
Eosinophils Relative: 0 %
HCT: 46.5 % — ABNORMAL HIGH (ref 36.0–46.0)
Hemoglobin: 14.9 g/dL (ref 12.0–15.0)
Immature Granulocytes: 7 %
Lymphocytes Relative: 30 %
Lymphs Abs: 0.9 10*3/uL (ref 0.7–4.0)
MCH: 29.8 pg (ref 26.0–34.0)
MCHC: 32 g/dL (ref 30.0–36.0)
MCV: 93 fL (ref 80.0–100.0)
Monocytes Absolute: 0.2 10*3/uL (ref 0.1–1.0)
Monocytes Relative: 6 %
Neutro Abs: 1.7 10*3/uL (ref 1.7–7.7)
Neutrophils Relative %: 57 %
Platelets: 210 10*3/uL (ref 150–400)
RBC: 5 MIL/uL (ref 3.87–5.11)
RDW: 14.6 % (ref 11.5–15.5)
Smear Review: NORMAL
WBC: 3 10*3/uL — ABNORMAL LOW (ref 4.0–10.5)
nRBC: 0 % (ref 0.0–0.2)

## 2020-06-22 ENCOUNTER — Inpatient Hospital Stay: Payer: Medicare Other

## 2020-06-22 DIAGNOSIS — D708 Other neutropenia: Secondary | ICD-10-CM | POA: Diagnosis not present

## 2020-06-22 DIAGNOSIS — D72819 Decreased white blood cell count, unspecified: Secondary | ICD-10-CM

## 2020-06-22 LAB — CBC WITH DIFFERENTIAL/PLATELET
Abs Immature Granulocytes: 0.03 10*3/uL (ref 0.00–0.07)
Basophils Absolute: 0 10*3/uL (ref 0.0–0.1)
Basophils Relative: 0 %
Eosinophils Absolute: 0 10*3/uL (ref 0.0–0.5)
Eosinophils Relative: 0 %
HCT: 43.5 % (ref 36.0–46.0)
Hemoglobin: 14 g/dL (ref 12.0–15.0)
Immature Granulocytes: 1 %
Lymphocytes Relative: 26 %
Lymphs Abs: 0.6 10*3/uL — ABNORMAL LOW (ref 0.7–4.0)
MCH: 30 pg (ref 26.0–34.0)
MCHC: 32.2 g/dL (ref 30.0–36.0)
MCV: 93.3 fL (ref 80.0–100.0)
Monocytes Absolute: 0.2 10*3/uL (ref 0.1–1.0)
Monocytes Relative: 10 %
Neutro Abs: 1.5 10*3/uL — ABNORMAL LOW (ref 1.7–7.7)
Neutrophils Relative %: 63 %
Platelets: 179 10*3/uL (ref 150–400)
RBC: 4.66 MIL/uL (ref 3.87–5.11)
RDW: 14.5 % (ref 11.5–15.5)
WBC: 2.5 10*3/uL — ABNORMAL LOW (ref 4.0–10.5)
nRBC: 0 % (ref 0.0–0.2)

## 2020-06-29 ENCOUNTER — Inpatient Hospital Stay: Payer: Medicare Other | Attending: Internal Medicine

## 2020-06-29 ENCOUNTER — Inpatient Hospital Stay: Payer: Medicare Other

## 2020-06-29 ENCOUNTER — Other Ambulatory Visit: Payer: Self-pay

## 2020-06-29 DIAGNOSIS — D72819 Decreased white blood cell count, unspecified: Secondary | ICD-10-CM

## 2020-06-29 DIAGNOSIS — Z79899 Other long term (current) drug therapy: Secondary | ICD-10-CM | POA: Diagnosis not present

## 2020-06-29 DIAGNOSIS — Z87891 Personal history of nicotine dependence: Secondary | ICD-10-CM | POA: Insufficient documentation

## 2020-06-29 DIAGNOSIS — M7989 Other specified soft tissue disorders: Secondary | ICD-10-CM | POA: Insufficient documentation

## 2020-06-29 DIAGNOSIS — Z800 Family history of malignant neoplasm of digestive organs: Secondary | ICD-10-CM | POA: Diagnosis not present

## 2020-06-29 DIAGNOSIS — Z811 Family history of alcohol abuse and dependence: Secondary | ICD-10-CM | POA: Diagnosis not present

## 2020-06-29 DIAGNOSIS — Z840 Family history of diseases of the skin and subcutaneous tissue: Secondary | ICD-10-CM | POA: Diagnosis not present

## 2020-06-29 DIAGNOSIS — D708 Other neutropenia: Secondary | ICD-10-CM

## 2020-06-29 DIAGNOSIS — M316 Other giant cell arteritis: Secondary | ICD-10-CM | POA: Insufficient documentation

## 2020-06-29 DIAGNOSIS — R030 Elevated blood-pressure reading, without diagnosis of hypertension: Secondary | ICD-10-CM | POA: Insufficient documentation

## 2020-06-29 LAB — CBC WITH DIFFERENTIAL/PLATELET
Abs Immature Granulocytes: 0.05 10*3/uL (ref 0.00–0.07)
Basophils Absolute: 0 10*3/uL (ref 0.0–0.1)
Basophils Relative: 1 %
Eosinophils Absolute: 0 10*3/uL (ref 0.0–0.5)
Eosinophils Relative: 1 %
HCT: 44.9 % (ref 36.0–46.0)
Hemoglobin: 14.3 g/dL (ref 12.0–15.0)
Immature Granulocytes: 2 %
Lymphocytes Relative: 35 %
Lymphs Abs: 0.8 10*3/uL (ref 0.7–4.0)
MCH: 29.9 pg (ref 26.0–34.0)
MCHC: 31.8 g/dL (ref 30.0–36.0)
MCV: 93.7 fL (ref 80.0–100.0)
Monocytes Absolute: 0.2 10*3/uL (ref 0.1–1.0)
Monocytes Relative: 9 %
Neutro Abs: 1.2 10*3/uL — ABNORMAL LOW (ref 1.7–7.7)
Neutrophils Relative %: 52 %
Platelets: 200 10*3/uL (ref 150–400)
RBC: 4.79 MIL/uL (ref 3.87–5.11)
RDW: 14.4 % (ref 11.5–15.5)
WBC: 2.4 10*3/uL — ABNORMAL LOW (ref 4.0–10.5)
nRBC: 0 % (ref 0.0–0.2)

## 2020-06-29 MED ORDER — FILGRASTIM-SNDZ 480 MCG/0.8ML IJ SOSY
480.0000 ug | PREFILLED_SYRINGE | Freq: Once | INTRAMUSCULAR | Status: AC
  Administered 2020-06-29: 480 ug via SUBCUTANEOUS
  Filled 2020-06-29: qty 0.8

## 2020-07-06 ENCOUNTER — Inpatient Hospital Stay: Payer: Medicare Other

## 2020-07-06 DIAGNOSIS — D72819 Decreased white blood cell count, unspecified: Secondary | ICD-10-CM

## 2020-07-06 DIAGNOSIS — D708 Other neutropenia: Secondary | ICD-10-CM | POA: Diagnosis not present

## 2020-07-06 LAB — CBC WITH DIFFERENTIAL/PLATELET
Abs Immature Granulocytes: 0 10*3/uL (ref 0.00–0.07)
Basophils Absolute: 0 10*3/uL (ref 0.0–0.1)
Basophils Relative: 0 %
Eosinophils Absolute: 0 10*3/uL (ref 0.0–0.5)
Eosinophils Relative: 1 %
HCT: 44.8 % (ref 36.0–46.0)
Hemoglobin: 14.5 g/dL (ref 12.0–15.0)
Immature Granulocytes: 0 %
Lymphocytes Relative: 29 %
Lymphs Abs: 0.8 10*3/uL (ref 0.7–4.0)
MCH: 29.8 pg (ref 26.0–34.0)
MCHC: 32.4 g/dL (ref 30.0–36.0)
MCV: 92 fL (ref 80.0–100.0)
Monocytes Absolute: 0.2 10*3/uL (ref 0.1–1.0)
Monocytes Relative: 8 %
Neutro Abs: 1.6 10*3/uL — ABNORMAL LOW (ref 1.7–7.7)
Neutrophils Relative %: 62 %
Platelets: 194 10*3/uL (ref 150–400)
RBC: 4.87 MIL/uL (ref 3.87–5.11)
RDW: 14.2 % (ref 11.5–15.5)
Smear Review: NORMAL
WBC: 2.6 10*3/uL — ABNORMAL LOW (ref 4.0–10.5)
nRBC: 0 % (ref 0.0–0.2)

## 2020-07-13 ENCOUNTER — Inpatient Hospital Stay: Payer: Medicare Other

## 2020-07-13 DIAGNOSIS — D72819 Decreased white blood cell count, unspecified: Secondary | ICD-10-CM

## 2020-07-13 DIAGNOSIS — D708 Other neutropenia: Secondary | ICD-10-CM

## 2020-07-13 LAB — CBC WITH DIFFERENTIAL/PLATELET
Abs Immature Granulocytes: 0.03 10*3/uL (ref 0.00–0.07)
Basophils Absolute: 0 10*3/uL (ref 0.0–0.1)
Basophils Relative: 1 %
Eosinophils Absolute: 0 10*3/uL (ref 0.0–0.5)
Eosinophils Relative: 1 %
HCT: 45.9 % (ref 36.0–46.0)
Hemoglobin: 14.7 g/dL (ref 12.0–15.0)
Immature Granulocytes: 1 %
Lymphocytes Relative: 43 %
Lymphs Abs: 1.2 10*3/uL (ref 0.7–4.0)
MCH: 29.5 pg (ref 26.0–34.0)
MCHC: 32 g/dL (ref 30.0–36.0)
MCV: 92.2 fL (ref 80.0–100.0)
Monocytes Absolute: 0.3 10*3/uL (ref 0.1–1.0)
Monocytes Relative: 10 %
Neutro Abs: 1.2 10*3/uL — ABNORMAL LOW (ref 1.7–7.7)
Neutrophils Relative %: 44 %
Platelets: 198 10*3/uL (ref 150–400)
RBC: 4.98 MIL/uL (ref 3.87–5.11)
RDW: 14.2 % (ref 11.5–15.5)
WBC: 2.7 10*3/uL — ABNORMAL LOW (ref 4.0–10.5)
nRBC: 0 % (ref 0.0–0.2)

## 2020-07-13 MED ORDER — FILGRASTIM-SNDZ 480 MCG/0.8ML IJ SOSY: 480.0000 ug | PREFILLED_SYRINGE | Freq: Once | INTRAMUSCULAR | Status: DC

## 2020-07-13 MED ORDER — FILGRASTIM-SNDZ 480 MCG/0.8ML IJ SOSY
480.0000 ug | PREFILLED_SYRINGE | Freq: Once | INTRAMUSCULAR | Status: AC
  Administered 2020-07-13: 480 ug via SUBCUTANEOUS
  Filled 2020-07-13: qty 0.8

## 2020-07-20 ENCOUNTER — Inpatient Hospital Stay: Payer: Medicare Other

## 2020-07-20 ENCOUNTER — Other Ambulatory Visit: Payer: Self-pay

## 2020-07-20 DIAGNOSIS — D708 Other neutropenia: Secondary | ICD-10-CM | POA: Diagnosis not present

## 2020-07-20 LAB — CBC WITH DIFFERENTIAL/PLATELET
Abs Immature Granulocytes: 0.12 10*3/uL — ABNORMAL HIGH (ref 0.00–0.07)
Basophils Absolute: 0 10*3/uL (ref 0.0–0.1)
Basophils Relative: 1 %
Eosinophils Absolute: 0.1 10*3/uL (ref 0.0–0.5)
Eosinophils Relative: 2 %
HCT: 46.2 % — ABNORMAL HIGH (ref 36.0–46.0)
Hemoglobin: 14.7 g/dL (ref 12.0–15.0)
Immature Granulocytes: 4 %
Lymphocytes Relative: 34 %
Lymphs Abs: 1 10*3/uL (ref 0.7–4.0)
MCH: 30 pg (ref 26.0–34.0)
MCHC: 31.8 g/dL (ref 30.0–36.0)
MCV: 94.3 fL (ref 80.0–100.0)
Monocytes Absolute: 0.2 10*3/uL (ref 0.1–1.0)
Monocytes Relative: 8 %
Neutro Abs: 1.6 10*3/uL — ABNORMAL LOW (ref 1.7–7.7)
Neutrophils Relative %: 51 %
Platelets: 228 10*3/uL (ref 150–400)
RBC: 4.9 MIL/uL (ref 3.87–5.11)
RDW: 14.3 % (ref 11.5–15.5)
Smear Review: NORMAL
WBC: 3 10*3/uL — ABNORMAL LOW (ref 4.0–10.5)
nRBC: 0 % (ref 0.0–0.2)

## 2020-07-27 ENCOUNTER — Inpatient Hospital Stay: Payer: Medicare Other | Attending: Internal Medicine

## 2020-07-27 ENCOUNTER — Other Ambulatory Visit: Payer: Self-pay

## 2020-07-27 ENCOUNTER — Inpatient Hospital Stay: Payer: Medicare Other

## 2020-07-27 DIAGNOSIS — Z808 Family history of malignant neoplasm of other organs or systems: Secondary | ICD-10-CM | POA: Insufficient documentation

## 2020-07-27 DIAGNOSIS — Z800 Family history of malignant neoplasm of digestive organs: Secondary | ICD-10-CM | POA: Insufficient documentation

## 2020-07-27 DIAGNOSIS — D708 Other neutropenia: Secondary | ICD-10-CM

## 2020-07-27 DIAGNOSIS — M316 Other giant cell arteritis: Secondary | ICD-10-CM | POA: Insufficient documentation

## 2020-07-27 DIAGNOSIS — Z79899 Other long term (current) drug therapy: Secondary | ICD-10-CM | POA: Diagnosis not present

## 2020-07-27 DIAGNOSIS — D72819 Decreased white blood cell count, unspecified: Secondary | ICD-10-CM

## 2020-07-27 DIAGNOSIS — Z811 Family history of alcohol abuse and dependence: Secondary | ICD-10-CM | POA: Diagnosis not present

## 2020-07-27 DIAGNOSIS — K435 Parastomal hernia without obstruction or  gangrene: Secondary | ICD-10-CM | POA: Insufficient documentation

## 2020-07-27 DIAGNOSIS — R682 Dry mouth, unspecified: Secondary | ICD-10-CM | POA: Diagnosis not present

## 2020-07-27 DIAGNOSIS — Z87891 Personal history of nicotine dependence: Secondary | ICD-10-CM | POA: Diagnosis not present

## 2020-07-27 LAB — CBC WITH DIFFERENTIAL/PLATELET
Abs Immature Granulocytes: 0.02 10*3/uL (ref 0.00–0.07)
Basophils Absolute: 0 10*3/uL (ref 0.0–0.1)
Basophils Relative: 1 %
Eosinophils Absolute: 0.1 10*3/uL (ref 0.0–0.5)
Eosinophils Relative: 2 %
HCT: 43.1 % (ref 36.0–46.0)
Hemoglobin: 13.7 g/dL (ref 12.0–15.0)
Immature Granulocytes: 1 %
Lymphocytes Relative: 42 %
Lymphs Abs: 0.9 10*3/uL (ref 0.7–4.0)
MCH: 29.6 pg (ref 26.0–34.0)
MCHC: 31.8 g/dL (ref 30.0–36.0)
MCV: 93.1 fL (ref 80.0–100.0)
Monocytes Absolute: 0.4 10*3/uL (ref 0.1–1.0)
Monocytes Relative: 16 %
Neutro Abs: 0.8 10*3/uL — ABNORMAL LOW (ref 1.7–7.7)
Neutrophils Relative %: 38 %
Platelets: 207 10*3/uL (ref 150–400)
RBC: 4.63 MIL/uL (ref 3.87–5.11)
RDW: 14.3 % (ref 11.5–15.5)
WBC: 2.2 10*3/uL — ABNORMAL LOW (ref 4.0–10.5)
nRBC: 0 % (ref 0.0–0.2)

## 2020-07-27 MED ORDER — FILGRASTIM-SNDZ 480 MCG/0.8ML IJ SOSY
480.0000 ug | PREFILLED_SYRINGE | Freq: Once | INTRAMUSCULAR | Status: AC
  Administered 2020-07-27: 480 ug via SUBCUTANEOUS
  Filled 2020-07-27: qty 0.8

## 2020-08-03 ENCOUNTER — Inpatient Hospital Stay: Payer: Medicare Other

## 2020-08-03 ENCOUNTER — Inpatient Hospital Stay (HOSPITAL_BASED_OUTPATIENT_CLINIC_OR_DEPARTMENT_OTHER): Payer: Medicare Other | Admitting: Internal Medicine

## 2020-08-03 DIAGNOSIS — D72819 Decreased white blood cell count, unspecified: Secondary | ICD-10-CM

## 2020-08-03 DIAGNOSIS — D708 Other neutropenia: Secondary | ICD-10-CM

## 2020-08-03 LAB — BASIC METABOLIC PANEL
Anion gap: 11 (ref 5–15)
BUN: 18 mg/dL (ref 8–23)
CO2: 27 mmol/L (ref 22–32)
Calcium: 8.7 mg/dL — ABNORMAL LOW (ref 8.9–10.3)
Chloride: 102 mmol/L (ref 98–111)
Creatinine, Ser: 0.93 mg/dL (ref 0.44–1.00)
GFR, Estimated: >60 mL/min (ref >60)
Glucose, Bld: 107 mg/dL — ABNORMAL HIGH (ref 70–99)
Potassium: 4.1 mmol/L (ref 3.5–5.1)
Sodium: 140 mmol/L (ref 135–145)

## 2020-08-03 LAB — CBC WITH DIFFERENTIAL/PLATELET
Abs Immature Granulocytes: 0.14 10*3/uL — ABNORMAL HIGH (ref 0.00–0.07)
Basophils Absolute: 0 10*3/uL (ref 0.0–0.1)
Basophils Relative: 1 %
Eosinophils Absolute: 0.1 10*3/uL (ref 0.0–0.5)
Eosinophils Relative: 3 %
HCT: 47.9 % — ABNORMAL HIGH (ref 36.0–46.0)
Hemoglobin: 15.1 g/dL — ABNORMAL HIGH (ref 12.0–15.0)
Immature Granulocytes: 7 %
Lymphocytes Relative: 46 %
Lymphs Abs: 1 10*3/uL (ref 0.7–4.0)
MCH: 29 pg (ref 26.0–34.0)
MCHC: 31.5 g/dL (ref 30.0–36.0)
MCV: 91.9 fL (ref 80.0–100.0)
Monocytes Absolute: 0.2 10*3/uL (ref 0.1–1.0)
Monocytes Relative: 10 %
Neutro Abs: 0.7 10*3/uL — ABNORMAL LOW (ref 1.7–7.7)
Neutrophils Relative %: 33 %
Platelets: 221 10*3/uL (ref 150–400)
RBC: 5.21 MIL/uL — ABNORMAL HIGH (ref 3.87–5.11)
RDW: 14.3 % (ref 11.5–15.5)
Smear Review: NORMAL
WBC: 2.1 10*3/uL — ABNORMAL LOW (ref 4.0–10.5)
nRBC: 0 % (ref 0.0–0.2)

## 2020-08-03 MED ORDER — FILGRASTIM-SNDZ 480 MCG/0.8ML IJ SOSY
480.0000 ug | PREFILLED_SYRINGE | Freq: Once | INTRAMUSCULAR | Status: AC
  Administered 2020-08-03: 480 ug via SUBCUTANEOUS
  Filled 2020-08-03: qty 0.8

## 2020-08-03 NOTE — Progress Notes (Signed)
Pt states that she she increased the Norvasc tablet to 5 mg daily. She was taking 2.5 mg. She stated that she thought the half tablet was not working efficiently. I asked the patient to inform her pcp that her dosage was increased.

## 2020-08-03 NOTE — Progress Notes (Signed)
[place] [facility] NOTE  Patient Care Team: Baxter Hire, MD as PCP - General (Internal Medicine) Emmaline Kluver., MD (Rheumatology) Dasher, Rayvon Char, MD (Dermatology) Cammie Sickle, MD as Consulting Physician (Hematology and Oncology)  CHIEF COMPLAINTS/PURPOSE OF CONSULTATION: Autoimmune neutropenia  #April 2019- Autoimmune neutropenia-weekly Granix/CBC [Dr.Crocoran]; BMBx- no malignancy [Bone marrow aspirate and biopsy on 09/04/2017 revealed a hypercellular marrow for age with trilineage hematopoiesis.  There was abundant mature neutrophils.  Significant dyspoiesis or increased blasts was not identified.  Flow cytometry revealed a predominance of T lymphocytes (16% of all cells) with no abnormal phenotype.  There was a minor B-cell population (13% of lymphocytes) with slight kappa light chain excess.  Cytogenetics revealed 65, XX, del(20)(q11.2)[2] / 46,XX[18].]; weekly cbc/granix [until July 2020]; Aug 2020- Zarxio;  June 2021-prednisone 10 mg a day; response noted' Ju;y 13th 2021- --prednisone 5 mg a day  #   temporal arteritis [2003] chronic prednisone/ 2.5 mg a day; Dr.Kernodle; SEP 2021- Prednisone 5mg  [refill]  Oncology History   No history exists.    HISTORY OF PRESENTING ILLNESS:  [name] 86 y.o.  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  Patient denies any recent UTIs or infections.  Denies any recent hospitalizations.  No fevers or chills.  Continues have easy bruising.  Noted to have dry mouth; thick saliva.  Denies any difficulty swallowing or pain with swallowing.  Review of Systems  Constitutional: Negative for chills, diaphoresis, fever, malaise/fatigue and weight loss.  HENT: Negative for nosebleeds and sore throat.   Eyes: Negative for double vision.  Respiratory: Negative for cough, hemoptysis, sputum production, shortness of breath and wheezing.    Cardiovascular: Negative for chest pain, palpitations and orthopnea.  Gastrointestinal: Negative for blood in stool, constipation, diarrhea, heartburn, melena, nausea and vomiting.  Genitourinary: Negative for dysuria, frequency and urgency.  Musculoskeletal: Negative for back pain and joint pain.  Skin: Negative for itching.  Neurological: Positive for tingling. Negative for dizziness, focal weakness, weakness and headaches.  Endo/Heme/Allergies: Bruises/bleeds easily.  Psychiatric/Behavioral: Negative for depression. The patient is nervous/anxious. The patient does not have insomnia.      MEDICAL HISTORY:  Past Medical History:  Diagnosis Date  . Breast cancer ([place]) 1985   bilateral mastectomies  . Depression    1 time specific situation that she did not know how to deal wiht  . Diverticulosis   . Hiatal hernia   . History of Bell's palsy   . History of colon polyps   . History of nephrolithiasis   . History of pancreatitis   . Hyperlipidemia   . IBS (irritable bowel syndrome)   . Osteoporosis    osteopenia in neck  . Skin cancer   . Temporal arteritis ([place])     SURGICAL HISTORY: Past Surgical History:  Procedure Laterality Date  . AUGMENTATION MAMMAPLASTY Bilateral 2000  . COLON RESECTION SIGMOID N/A 10/13/2017   Procedure: COLON RESECTION SIGMOID;  Surgeon: Herbert Pun, MD;  Location: [facility] ORS;  Service: General;  Laterality: N/A;  . COLOSTOMY N/A 10/13/2017   Procedure: COLOSTOMY;  Surgeon: Herbert Pun, MD;  Location: [facility] ORS;  Service: General;  Laterality: N/A;  . LAPAROSCOPIC CHOLECYSTECTOMY  2009  . MASTECTOMY  1983   bilateral    SOCIAL HISTORY: Social History   Socioeconomic History  . Marital status: Widowed    Spouse name: Not on file  . Number of children: 2  . Years of  education: college  . Highest education level: Not on file  Occupational History  . Occupation: Engineer, production  . Occupation: Retired  Tobacco Use  . Smoking  status: Former Smoker    Packs/day: 0.25    Years: 30.00    Pack years: 7.50    Types: Cigarettes    Quit date: 05/17/2005    Years since quitting: 15.2  . Smokeless tobacco: Never Used  . Tobacco comment: quit 11 years ago  Vaping Use  . Vaping Use: Never used  Substance and Sexual Activity  . Alcohol use: No    Alcohol/week: 0.0 standard drinks  . Drug use: No  . Sexual activity: Not Currently  Other Topics Concern  . Not on file  Social History Narrative   Patient lives at home alone.    Patient is retired.    Patient has some college.    Patient has 2 children.   Social Determinants of Health   Financial Resource Strain: Not on file  Food Insecurity: Not on file  Transportation Needs: Not on file  Physical Activity: Not on file  Stress: Not on file  Social Connections: Not on file  Intimate Partner Violence: Not on file    FAMILY HISTORY: Family History  Problem Relation Age of Onset  . Colon cancer Mother 6  . Scleroderma Father 53  . Alcoholism Brother   . Stomach cancer Neg Hx     ALLERGIES:  is allergic to morphine, benzalkonium chloride, escitalopram, hydralazine, neomycin-bacitracin zn-polymyx, and bacitracin-neomycin-polymyxin.  MEDICATIONS:  Current Outpatient Medications  Medication Sig Dispense Refill  . alendronate (FOSAMAX) 70 MG tablet Take 70 mg by mouth once a week.    . ALPRAZolam (XANAX) 0.25 MG tablet Take 0.25 mg by mouth 2 (two) times daily as needed.     Marland Kitchen amLODipine (NORVASC) 5 MG tablet Take 5 mg by mouth daily. Pt states that she she increased the tablet to 5 mg daily. She was taking 2.5 mg. She stated that she thought the half tablet was not working efficiently. I asked the patient to inform her pcp that her dosage was increased.    . Ascorbic Acid (VITAMIN C) 1000 MG tablet Take 1,000 mg by mouth daily.    Marland Kitchen aspirin EC 81 MG tablet Take 81 mg daily by mouth.    . Biotin 1000 MCG tablet Take 1,000 mcg by mouth daily.     .  Cholecalciferol (VITAMIN D3) 2000 units capsule Take 2,000 Units by mouth daily.    . Cyanocobalamin (B-12 PO) Take 2,500 mcg daily by mouth.     Karle Barr (ZARXIO) 480 MCG/0.8ML SOSY injection Inject 0.8 mLs (480 mcg total) into the skin once a week. Administer injection subcutaneously 3.2 mL 6  . predniSONE (DELTASONE) 5 MG tablet Take 5 mg by mouth daily.    . traMADol (ULTRAM) 50 MG tablet Take 50 mg by mouth daily.    Marland Kitchen ZINC OXIDE PO Take 1 tablet by mouth daily.     No current facility-administered medications for this visit.    PHYSICAL EXAMINATION: ECOG PERFORMANCE STATUS: 0 - Asymptomatic  Vitals:   08/03/20 1018  BP: (!) 139/59  Pulse: 79  Resp: 18  Temp: 98.4 F (36.9 C)   Filed Weights   08/03/20 1018  Weight: 171 lb 9.6 oz (77.8 kg)    Physical Exam Constitutional:      Comments: Alone.  HENT:     Head: Normocephalic and atraumatic.     Mouth/Throat:  Pharynx: No oropharyngeal exudate.  Eyes:     Pupils: Pupils are equal, round, and reactive to light.  Cardiovascular:     Rate and Rhythm: Normal rate and regular rhythm.  Pulmonary:     Effort: No respiratory distress.     Breath sounds: No wheezing.  Abdominal:     General: Bowel sounds are normal. There is no distension.     Palpations: Abdomen is soft. There is no mass.     Tenderness: There is no abdominal tenderness. There is no guarding or rebound.     Comments: Colostomy.  Parastomal hernia.  Musculoskeletal:        General: No tenderness. Normal range of motion.     Cervical back: Normal range of motion and neck supple.  Skin:    General: Skin is warm.     Comments: Hyperpigmented rash noted bilateral shins.  Neurological:     Mental Status: She is alert and oriented to person, place, and time.  Psychiatric:        Mood and Affect: Affect normal.     Comments: Anxious.      LABORATORY DATA:  I have reviewed the data as listed Lab Results  Component Value Date   WBC 2.1 (L)  08/03/2020   HGB 15.1 (H) 08/03/2020   HCT 47.9 (H) 08/03/2020   MCV 91.9 08/03/2020   PLT 221 08/03/2020   Recent Labs    09/24/19 1916 01/06/20 0936 02/17/20 0955 04/13/20 0909 05/11/20 1022 06/08/20 1038 08/03/20 0956  NA 139 140 140   < > 138 138 140  K 4.1 4.2 4.0   < > 4.1 4.1 4.1  CL 101 101 101   < > 99 98 102  CO2 28 28 28    < > 27 30 27   GLUCOSE 91 107* 96   < > 99 125* 107*  BUN 19 19 15    < > 17 22 18   CREATININE 1.03* 0.89 1.04*   < > 1.05* 1.02* 0.93  CALCIUM 9.3 8.8* 8.6*   < > 8.9 9.0 8.7*  GFRNONAA 50* 60* 50*   < > 53* 55* >60  GFRAA 58* >60 58*  --   --   --   --    < > = values in this interval not displayed.    RADIOGRAPHIC STUDIES: I have personally reviewed the radiological images as listed and agreed with the findings in the report. No results found.  ASSESSMENT & PLAN:   Autoimmune neutropenia (HCC) # Autoimmune neutropenia- once a week- if ANC < 1.5- zaxio-patient's WBC- today-2.2; ANC- 0.7;  Continue zarxio today; otherwise continue weekly zarxio.  Patient clinically asymptomatic.  Continue cbc/zarxioweekly/pt pref. STABLE  # Prednisone 5 mg/day [will fill]; continue at current dose.   # Hx of temporal arteritis->20 years on low dose prednisone-STABLE.   # Easy burising: sec to prednisone. STABLE   # Dry mouth- 2-3 months; ? [place]- if not improved defer to Rheumatology.   # DISPOSITION: # proceed with ZARXIO today # weekly cbc/zaxio x 8 # follow up with MD in 8  weeks- cbc/bmp-zarxio-Dr.B  All questions were answered. The patient knows to call the clinic with any problems, questions or concerns.    Cammie Sickle, MD 08/03/2020 11:04 AM

## 2020-08-03 NOTE — Assessment & Plan Note (Addendum)
#   Autoimmune neutropenia- once a week- if ANC < 1.5- zaxio-patient's WBC- today-2.2; ANC- 0.7;  Continue zarxio today; otherwise continue weekly zarxio.  Patient clinically asymptomatic.  Continue cbc/zarxioweekly/pt pref. STABLE  # Prednisone 5 mg/day [will fill]; continue at current dose.   # Hx of temporal arteritis->20 years on low dose prednisone-STABLE.   # Easy burising: sec to prednisone. STABLE   # Dry mouth- 2-3 months; ? [place]- if not improved defer to Rheumatology.   # DISPOSITION: # proceed with ZARXIO today # weekly cbc/zaxio x 8 # follow up with MD in 8  weeks- cbc/bmp-zarxio-Dr.B

## 2020-08-10 ENCOUNTER — Inpatient Hospital Stay: Payer: Medicare Other

## 2020-08-10 ENCOUNTER — Telehealth: Payer: Self-pay | Admitting: *Deleted

## 2020-08-10 DIAGNOSIS — D708 Other neutropenia: Secondary | ICD-10-CM | POA: Diagnosis not present

## 2020-08-10 LAB — CBC WITH DIFFERENTIAL/PLATELET
Abs Immature Granulocytes: 0.05 10*3/uL (ref 0.00–0.07)
Basophils Absolute: 0 10*3/uL (ref 0.0–0.1)
Basophils Relative: 0 %
Eosinophils Absolute: 0 10*3/uL (ref 0.0–0.5)
Eosinophils Relative: 1 %
HCT: 46.1 % — ABNORMAL HIGH (ref 36.0–46.0)
Hemoglobin: 14.5 g/dL (ref 12.0–15.0)
Immature Granulocytes: 2 %
Lymphocytes Relative: 23 %
Lymphs Abs: 0.6 10*3/uL — ABNORMAL LOW (ref 0.7–4.0)
MCH: 29 pg (ref 26.0–34.0)
MCHC: 31.5 g/dL (ref 30.0–36.0)
MCV: 92.2 fL (ref 80.0–100.0)
Monocytes Absolute: 0.3 10*3/uL (ref 0.1–1.0)
Monocytes Relative: 11 %
Neutro Abs: 1.5 10*3/uL — ABNORMAL LOW (ref 1.7–7.7)
Neutrophils Relative %: 63 %
Platelets: 191 10*3/uL (ref 150–400)
RBC: 5 MIL/uL (ref 3.87–5.11)
RDW: 14.6 % (ref 11.5–15.5)
Smear Review: NORMAL
WBC: 2.4 10*3/uL — ABNORMAL LOW (ref 4.0–10.5)
nRBC: 0 % (ref 0.0–0.2)

## 2020-08-10 NOTE — Telephone Encounter (Signed)
Patient called reporting that she has had a sinus thing going on that has not gone into her right ear which she can not hear out of now. She states she did not call her PCP because she did not know if antibiotics would interfere with the medications Dr B has her on or not. Please advise

## 2020-08-10 NOTE — Telephone Encounter (Signed)
Patient informed to contact her PCP for this

## 2020-08-10 NOTE — Telephone Encounter (Signed)
Per Dr. Rogue Bussing -Please have patient be evaluated by her PCP

## 2020-08-17 ENCOUNTER — Inpatient Hospital Stay: Payer: Medicare Other

## 2020-08-17 ENCOUNTER — Other Ambulatory Visit: Payer: Self-pay

## 2020-08-17 DIAGNOSIS — D72819 Decreased white blood cell count, unspecified: Secondary | ICD-10-CM

## 2020-08-17 DIAGNOSIS — D708 Other neutropenia: Secondary | ICD-10-CM

## 2020-08-17 LAB — CBC WITH DIFFERENTIAL/PLATELET
Abs Immature Granulocytes: 0.02 10*3/uL (ref 0.00–0.07)
Basophils Absolute: 0 10*3/uL (ref 0.0–0.1)
Basophils Relative: 0 %
Eosinophils Absolute: 0 10*3/uL (ref 0.0–0.5)
Eosinophils Relative: 1 %
HCT: 46.1 % — ABNORMAL HIGH (ref 36.0–46.0)
Hemoglobin: 14.5 g/dL (ref 12.0–15.0)
Immature Granulocytes: 1 %
Lymphocytes Relative: 31 %
Lymphs Abs: 0.7 10*3/uL (ref 0.7–4.0)
MCH: 29.2 pg (ref 26.0–34.0)
MCHC: 31.5 g/dL (ref 30.0–36.0)
MCV: 92.8 fL (ref 80.0–100.0)
Monocytes Absolute: 0.2 10*3/uL (ref 0.1–1.0)
Monocytes Relative: 10 %
Neutro Abs: 1.3 10*3/uL — ABNORMAL LOW (ref 1.7–7.7)
Neutrophils Relative %: 57 %
Platelets: 230 10*3/uL (ref 150–400)
RBC: 4.97 MIL/uL (ref 3.87–5.11)
RDW: 14.4 % (ref 11.5–15.5)
WBC: 2.2 10*3/uL — ABNORMAL LOW (ref 4.0–10.5)
nRBC: 0 % (ref 0.0–0.2)

## 2020-08-17 MED ORDER — FILGRASTIM-SNDZ 480 MCG/0.8ML IJ SOSY
480.0000 ug | PREFILLED_SYRINGE | Freq: Once | INTRAMUSCULAR | Status: AC
  Administered 2020-08-17: 480 ug via SUBCUTANEOUS
  Filled 2020-08-17: qty 0.8

## 2020-08-24 ENCOUNTER — Inpatient Hospital Stay: Payer: Medicare Other

## 2020-08-24 DIAGNOSIS — D708 Other neutropenia: Secondary | ICD-10-CM

## 2020-08-24 DIAGNOSIS — D72819 Decreased white blood cell count, unspecified: Secondary | ICD-10-CM

## 2020-08-24 LAB — CBC WITH DIFFERENTIAL/PLATELET
Abs Immature Granulocytes: 0.14 10*3/uL — ABNORMAL HIGH (ref 0.00–0.07)
Basophils Absolute: 0 10*3/uL (ref 0.0–0.1)
Basophils Relative: 1 %
Eosinophils Absolute: 0.1 10*3/uL (ref 0.0–0.5)
Eosinophils Relative: 2 %
HCT: 46 % (ref 36.0–46.0)
Hemoglobin: 14.6 g/dL (ref 12.0–15.0)
Immature Granulocytes: 6 %
Lymphocytes Relative: 48 %
Lymphs Abs: 1.2 10*3/uL (ref 0.7–4.0)
MCH: 29.3 pg (ref 26.0–34.0)
MCHC: 31.7 g/dL (ref 30.0–36.0)
MCV: 92.2 fL (ref 80.0–100.0)
Monocytes Absolute: 0.2 10*3/uL (ref 0.1–1.0)
Monocytes Relative: 9 %
Neutro Abs: 0.8 10*3/uL — ABNORMAL LOW (ref 1.7–7.7)
Neutrophils Relative %: 34 %
Platelets: 219 10*3/uL (ref 150–400)
RBC: 4.99 MIL/uL (ref 3.87–5.11)
RDW: 14.8 % (ref 11.5–15.5)
Smear Review: ADEQUATE
WBC: 2.5 10*3/uL — ABNORMAL LOW (ref 4.0–10.5)
nRBC: 0 % (ref 0.0–0.2)

## 2020-08-24 MED ORDER — FILGRASTIM-SNDZ 480 MCG/0.8ML IJ SOSY
480.0000 ug | PREFILLED_SYRINGE | Freq: Once | INTRAMUSCULAR | Status: AC
  Administered 2020-08-24: 480 ug via SUBCUTANEOUS
  Filled 2020-08-24: qty 0.8

## 2020-08-31 ENCOUNTER — Inpatient Hospital Stay: Payer: Medicare Other

## 2020-08-31 ENCOUNTER — Other Ambulatory Visit: Payer: Self-pay

## 2020-08-31 ENCOUNTER — Inpatient Hospital Stay: Payer: Medicare Other | Attending: Internal Medicine

## 2020-08-31 DIAGNOSIS — D708 Other neutropenia: Secondary | ICD-10-CM | POA: Insufficient documentation

## 2020-08-31 LAB — CBC WITH DIFFERENTIAL/PLATELET
Abs Immature Granulocytes: 0.17 10*3/uL — ABNORMAL HIGH (ref 0.00–0.07)
Basophils Absolute: 0 10*3/uL (ref 0.0–0.1)
Basophils Relative: 0 %
Eosinophils Absolute: 0 10*3/uL (ref 0.0–0.5)
Eosinophils Relative: 1 %
HCT: 46.1 % — ABNORMAL HIGH (ref 36.0–46.0)
Hemoglobin: 14.4 g/dL (ref 12.0–15.0)
Immature Granulocytes: 6 %
Lymphocytes Relative: 27 %
Lymphs Abs: 0.7 10*3/uL (ref 0.7–4.0)
MCH: 29.1 pg (ref 26.0–34.0)
MCHC: 31.2 g/dL (ref 30.0–36.0)
MCV: 93.1 fL (ref 80.0–100.0)
Monocytes Absolute: 0.3 10*3/uL (ref 0.1–1.0)
Monocytes Relative: 11 %
Neutro Abs: 1.5 10*3/uL — ABNORMAL LOW (ref 1.7–7.7)
Neutrophils Relative %: 55 %
Platelets: 190 10*3/uL (ref 150–400)
RBC: 4.95 MIL/uL (ref 3.87–5.11)
RDW: 14.9 % (ref 11.5–15.5)
Smear Review: ADEQUATE
WBC: 2.7 10*3/uL — ABNORMAL LOW (ref 4.0–10.5)
nRBC: 0 % (ref 0.0–0.2)

## 2020-09-07 ENCOUNTER — Inpatient Hospital Stay: Payer: Medicare Other

## 2020-09-07 DIAGNOSIS — D708 Other neutropenia: Secondary | ICD-10-CM

## 2020-09-07 LAB — CBC WITH DIFFERENTIAL/PLATELET
Abs Immature Granulocytes: 0.12 10*3/uL — ABNORMAL HIGH (ref 0.00–0.07)
Basophils Absolute: 0 10*3/uL (ref 0.0–0.1)
Basophils Relative: 1 %
Eosinophils Absolute: 0 10*3/uL (ref 0.0–0.5)
Eosinophils Relative: 1 %
HCT: 46.4 % — ABNORMAL HIGH (ref 36.0–46.0)
Hemoglobin: 14.8 g/dL (ref 12.0–15.0)
Immature Granulocytes: 4 %
Lymphocytes Relative: 26 %
Lymphs Abs: 0.7 10*3/uL (ref 0.7–4.0)
MCH: 29.3 pg (ref 26.0–34.0)
MCHC: 31.9 g/dL (ref 30.0–36.0)
MCV: 91.9 fL (ref 80.0–100.0)
Monocytes Absolute: 0.3 10*3/uL (ref 0.1–1.0)
Monocytes Relative: 10 %
Neutro Abs: 1.6 10*3/uL — ABNORMAL LOW (ref 1.7–7.7)
Neutrophils Relative %: 58 %
Platelets: 232 10*3/uL (ref 150–400)
RBC: 5.05 MIL/uL (ref 3.87–5.11)
RDW: 15.1 % (ref 11.5–15.5)
WBC: 2.7 10*3/uL — ABNORMAL LOW (ref 4.0–10.5)
nRBC: 0 % (ref 0.0–0.2)

## 2020-09-09 ENCOUNTER — Other Ambulatory Visit: Payer: Self-pay

## 2020-09-09 ENCOUNTER — Other Ambulatory Visit: Payer: Self-pay | Admitting: Internal Medicine

## 2020-09-09 ENCOUNTER — Ambulatory Visit
Admission: RE | Admit: 2020-09-09 | Discharge: 2020-09-09 | Disposition: A | Discharge status: Home / Self Care | Payer: Medicare Other | Source: Ambulatory Visit | Attending: Internal Medicine | Admitting: Internal Medicine

## 2020-09-09 DIAGNOSIS — R1032 Left lower quadrant pain: Secondary | ICD-10-CM | POA: Insufficient documentation

## 2020-09-09 LAB — POCT I-STAT CREATININE: Creatinine, Ser: 1 mg/dL (ref 0.44–1.00)

## 2020-09-09 MED ORDER — IOHEXOL 300 MG/ML  SOLN
100.0000 mL | Freq: Once | INTRAMUSCULAR | Status: AC | PRN
  Administered 2020-09-09: 100 mL via INTRAVENOUS

## 2020-09-13 ENCOUNTER — Other Ambulatory Visit: Payer: Self-pay

## 2020-09-13 ENCOUNTER — Emergency Department
Admission: EM | Admit: 2020-09-13 | Discharge: 2020-09-13 | Disposition: A | Discharge status: Home / Self Care | Payer: Medicare Other | Attending: Emergency Medicine | Admitting: Emergency Medicine

## 2020-09-13 DIAGNOSIS — Z85828 Personal history of other malignant neoplasm of skin: Secondary | ICD-10-CM | POA: Insufficient documentation

## 2020-09-13 DIAGNOSIS — Z853 Personal history of malignant neoplasm of breast: Secondary | ICD-10-CM | POA: Insufficient documentation

## 2020-09-13 DIAGNOSIS — Z87891 Personal history of nicotine dependence: Secondary | ICD-10-CM | POA: Diagnosis not present

## 2020-09-13 DIAGNOSIS — D72819 Decreased white blood cell count, unspecified: Secondary | ICD-10-CM | POA: Diagnosis not present

## 2020-09-13 DIAGNOSIS — U071 COVID-19: Secondary | ICD-10-CM | POA: Diagnosis not present

## 2020-09-13 DIAGNOSIS — Z7982 Long term (current) use of aspirin: Secondary | ICD-10-CM | POA: Insufficient documentation

## 2020-09-13 DIAGNOSIS — R519 Headache, unspecified: Secondary | ICD-10-CM | POA: Diagnosis present

## 2020-09-13 DIAGNOSIS — J029 Acute pharyngitis, unspecified: Secondary | ICD-10-CM

## 2020-09-13 LAB — CBC WITH DIFFERENTIAL/PLATELET
Abs Immature Granulocytes: 0.12 10*3/uL — ABNORMAL HIGH (ref 0.00–0.07)
Basophils Absolute: 0 10*3/uL (ref 0.0–0.1)
Basophils Relative: 1 %
Eosinophils Absolute: 0 10*3/uL (ref 0.0–0.5)
Eosinophils Relative: 1 %
HCT: 45.8 % (ref 36.0–46.0)
Hemoglobin: 14.9 g/dL (ref 12.0–15.0)
Immature Granulocytes: 7 %
Lymphocytes Relative: 35 %
Lymphs Abs: 0.6 10*3/uL — ABNORMAL LOW (ref 0.7–4.0)
MCH: 29.3 pg (ref 26.0–34.0)
MCHC: 32.5 g/dL (ref 30.0–36.0)
MCV: 90 fL (ref 80.0–100.0)
Monocytes Absolute: 0.3 10*3/uL (ref 0.1–1.0)
Monocytes Relative: 17 %
Neutro Abs: 0.7 10*3/uL — ABNORMAL LOW (ref 1.7–7.7)
Neutrophils Relative %: 39 %
Platelets: 190 10*3/uL (ref 150–400)
RBC: 5.09 MIL/uL (ref 3.87–5.11)
RDW: 15.3 % (ref 11.5–15.5)
Smear Review: NORMAL
WBC: 1.7 10*3/uL — ABNORMAL LOW (ref 4.0–10.5)
nRBC: 0 % (ref 0.0–0.2)

## 2020-09-13 LAB — BASIC METABOLIC PANEL
Anion gap: 12 (ref 5–15)
BUN: 15 mg/dL (ref 8–23)
CO2: 25 mmol/L (ref 22–32)
Calcium: 8.7 mg/dL — ABNORMAL LOW (ref 8.9–10.3)
Chloride: 99 mmol/L (ref 98–111)
Creatinine, Ser: 1.13 mg/dL — ABNORMAL HIGH (ref 0.44–1.00)
GFR, Estimated: 48 mL/min — ABNORMAL LOW (ref >60)
Glucose, Bld: 102 mg/dL — ABNORMAL HIGH (ref 70–99)
Potassium: 3.9 mmol/L (ref 3.5–5.1)
Sodium: 136 mmol/L (ref 135–145)

## 2020-09-13 MED ORDER — KETOROLAC TROMETHAMINE 30 MG/ML IJ SOLN
15.0000 mg | Freq: Once | INTRAMUSCULAR | Status: AC
  Administered 2020-09-13: 15 mg via INTRAVENOUS
  Filled 2020-09-13: qty 1

## 2020-09-13 MED ORDER — DEXAMETHASONE SODIUM PHOSPHATE 10 MG/ML IJ SOLN
10.0000 mg | Freq: Once | INTRAMUSCULAR | Status: AC
  Administered 2020-09-13: 10 mg via INTRAVENOUS
  Filled 2020-09-13: qty 1

## 2020-09-13 MED ORDER — SODIUM CHLORIDE 0.9 % IV BOLUS (SEPSIS): 500.0000 mL | Freq: Once | INTRAVENOUS | Status: DC

## 2020-09-13 MED ORDER — LIDOCAINE VISCOUS HCL 2 % MT SOLN
15.0000 mL | Freq: Once | OROMUCOSAL | Status: AC
  Administered 2020-09-13: 15 mL via OROMUCOSAL
  Filled 2020-09-13: qty 15

## 2020-09-13 MED ORDER — NIRMATRELVIR/RITONAVIR (PAXLOVID)TABLET
2.0000 | ORAL_TABLET | Freq: Two times a day (BID) | ORAL | 0 refills | Status: AC
  Filled 2020-09-13: qty 20, 5d supply, fill #0

## 2020-09-13 MED ORDER — LIDOCAINE VISCOUS HCL 2 % MT SOLN
15.0000 mL | OROMUCOSAL | 0 refills | Status: DC | PRN
  Filled 2020-09-13: qty 150, 2d supply, fill #0

## 2020-09-13 NOTE — Discharge Instructions (Signed)
You may alternate Tylenol 1000 mg every 6 hours as needed for pain, fever and Ibuprofen 600 mg every 8 hours as needed for pain, fever.  Please take Ibuprofen with food.  Do not take more than 4000 mg of Tylenol (acetaminophen) in a 24 hour period.  You may use throat lozenges and Chloraseptic spray as needed for sore throat.  You will need to quarantine for 10 days after the onset of symptoms.

## 2020-09-13 NOTE — ED Provider Notes (Signed)
[facility] Emergency Department Provider Note  ____________________________________________   Event Date/Time   First MD Initiated Contact with Patient 09/13/20 [phone]     (approximate)  I have reviewed the triage vital signs and the nursing notes.   HISTORY  Chief Complaint Covid Positive (COVID+ 4/16, reports sore throat 10/10 pain and headache. Denies CP, SOB, n/v. Took tylenol around 2200 last night)    HPI [name] is a 86 y.o. 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  Tested positive for the home test for COVID-19 on Saturday, April 16.  Has been vaccinated x2 and has had her booster vaccination for COVID-19.  She denies any cough, chest pain, shortness of breath, nausea, vomiting or diarrhea.  Last took Tylenol around 10 PM last night.  No numbness or weakness.  Symptoms started Friday night, April 15.        Past Medical History:  Diagnosis Date  . Breast cancer ([place]) 1985   bilateral mastectomies  . Depression    1 time specific situation that she did not know how to deal wiht  . Diverticulosis   . Hiatal hernia   . History of Bell's palsy   . History of colon polyps   . History of nephrolithiasis   . History of pancreatitis   . Hyperlipidemia   . IBS (irritable bowel syndrome)   . Osteoporosis    osteopenia in neck  . Skin cancer   . Temporal arteritis [facility])     Patient Active Problem List   Diagnosis Date Noted  . Neutropenia, unspecified ([place]) 07/17/2018  . Autoimmune neutropenia ([place]) 07/17/2018  . Diverticulitis of both large and small intestine with abscess 10/11/2017  . Weight loss 09/30/2017  . Night sweats 09/30/2017  . Interface dermatitis, lichenoid type 95/28/4132  . Leukopenia 09/02/2017  . Maculopapular rash 09/02/2017  . Diverticulitis 08/02/2017  .  Diverticulitis of large intestine with complication   . Diverticulitis of large intestine with perforation and abscess without bleeding   . Pyelonephritis   . Dehydration   . Abscess of sigmoid colon   . Acute diverticulitis 06/14/2017  . Atherosclerosis of native arteries of extremity with intermittent claudication ([place]) 08/01/2016  . PAD (peripheral artery disease) ([place]) 05/10/2016  . Pain in limb 05/02/2016  . Atrophic vaginitis 11/04/2014  . Cystocele, grade 2 11/04/2014  . Nephrolithiasis 11/04/2014  . H/O neoplasm 07/27/2014  . History of nonmelanoma skin cancer 07/27/2014  . Hyperlipidemia, unspecified 04/14/2014  . Osteoporosis 01/07/2014  . Osteoarthritis 01/07/2014  . Biliary tract disorder 01/07/2014  . Anosmia 03/19/2013  . PANCREATITIS 05/27/2008  . NAUSEA 05/27/2008  . DIARRHEA 05/27/2008  . ABDOMINAL PAIN-EPIGASTRIC 05/27/2008  . Temporal arteritis ([place]) 08/05/2007  . OSTEOPOROSIS 08/05/2007  . IRRITABLE BOWEL SYNDROME, HX OF 08/05/2007  . DIVERTICULOSIS, COLON 03/08/2007  . COLONIC POLYPS, ADENOMATOUS 10/28/1998  . GERD 10/28/1998  . HIATAL HERNIA 10/28/1998    Past Surgical History:  Procedure Laterality Date  . AUGMENTATION MAMMAPLASTY Bilateral 2000  . COLON RESECTION SIGMOID N/A 10/13/2017   Procedure: COLON RESECTION SIGMOID;  Surgeon: Herbert Pun, MD;  Location: [facility] ORS;  Service: General;  Laterality: N/A;  . COLOSTOMY N/A 10/13/2017   Procedure: COLOSTOMY;  Surgeon: Herbert Pun, MD;  Location: [facility] ORS;  Service: General;  Laterality: N/A;  . LAPAROSCOPIC CHOLECYSTECTOMY  2009  . MASTECTOMY  1983  bilateral    Prior to Admission medications   Medication Sig Start Date End Date Taking? Authorizing Provider  lidocaine (XYLOCAINE) 2 % solution Use as directed 15 mLs in the mouth or throat every 4 (four) hours as needed for mouth pain. 09/13/20  Yes Lulu Hirschmann, Delice Bison, DO  nirmatrelvir/ritonavir EUA (PAXLOVID) TABS Take 2 tablets by  mouth 2 (two) times daily for 5 days. Patient GFR is 48.  Take nirmatrelvir (150 mg) 1 tablet twice daily for 5 days and ritonavir (100 mg) one tablet twice daily for 5 days. 09/13/20 09/18/20 Yes Asencion Loveday, Delice Bison, DO  alendronate (FOSAMAX) 70 MG tablet Take 70 mg by mouth once a week. 06/08/20   [provider]  ALPRAZolam Duanne Moron) 0.25 MG tablet Take 0.25 mg by mouth 2 (two) times daily as needed.  11/03/10   [provider]  amLODipine (NORVASC) 5 MG tablet Take 5 mg by mouth daily. Pt states that she she increased the tablet to 5 mg daily. She was taking 2.5 mg. She stated that she thought the half tablet was not working efficiently. I asked the patient to inform her pcp that her dosage was increased. 09/25/19 09/24/20  [provider]  Ascorbic Acid (VITAMIN C) 1000 MG tablet Take 1,000 mg by mouth daily.    [provider]  aspirin EC 81 MG tablet Take 81 mg daily by mouth.    [provider]  Biotin 1000 MCG tablet Take 1,000 mcg by mouth daily.     [provider]  Cholecalciferol (VITAMIN D3) 2000 units capsule Take 2,000 Units by mouth daily.    [provider]  Cyanocobalamin (B-12 PO) Take 2,500 mcg daily by mouth.     [provider]  filgrastim-sndz (ZARXIO) 480 MCG/0.8ML SOSY injection Inject 0.8 mLs (480 mcg total) into the skin once a week. Administer injection subcutaneously 06/25/19   Cammie Sickle, MD  predniSONE (DELTASONE) 5 MG tablet Take 5 mg by mouth daily.    [provider]  traMADol (ULTRAM) 50 MG tablet Take 50 mg by mouth daily. 11/21/10   [provider]  ZINC OXIDE PO Take 1 tablet by mouth daily.    [provider]    Allergies Morphine, Benzalkonium chloride, Escitalopram, Hydralazine, Neomycin-bacitracin zn-polymyx, and Bacitracin-neomycin-polymyxin  Family History  Problem Relation Age of Onset  . Colon cancer Mother 33  . Scleroderma Father 37  . Alcoholism  Brother   . Stomach cancer Neg Hx     Social History Social History   Tobacco Use  . Smoking status: Former Smoker    Packs/day: 0.25    Years: 30.00    Pack years: 7.50    Types: Cigarettes    Quit date: 05/17/2005    Years since quitting: 15.3  . Smokeless tobacco: Never Used  . Tobacco comment: quit 11 years ago  Vaping Use  . Vaping Use: Never used  Substance Use Topics  . Alcohol use: No    Alcohol/week: 0.0 standard drinks  . Drug use: No    Review of Systems Constitutional: No fever. Eyes: No visual changes. ENT: +sore throat. Cardiovascular: Denies chest pain. Respiratory: Denies shortness of breath. Gastrointestinal: No nausea, vomiting, diarrhea. Genitourinary: Negative for dysuria. Musculoskeletal: Negative for back pain. Skin: Negative for rash. Neurological: Negative for focal weakness or numbness.  ____________________________________________   PHYSICAL EXAM:  VITAL SIGNS: ED Triage Vitals  Enc Vitals Group     BP 09/13/20 0532 (!) 119/55  Pulse Rate 09/13/20 0532 86     Resp 09/13/20 0532 18     Temp 09/13/20 0532 99.3 F (37.4 C)     Temp Source 09/13/20 0532 Oral     SpO2 09/13/20 0528 95 %     Weight 09/13/20 0532 167 lb (75.8 kg)     Height 09/13/20 0532 5\' 5"  (1.651 m)     Head Circumference --      Peak Flow --      Pain Score --      Pain Loc --      Pain Edu? --      Excl. in [place]? --    CONSTITUTIONAL: Alert and oriented and responds appropriately to questions. Well-appearing; well-nourished HEAD: Normocephalic EYES: Conjunctivae clear, pupils appear equal, EOM appear intact ENT: normal nose; moist mucous membranes; posterior oropharynx is erythematous but no petechiae, no tonsillar hypertrophy or exudate, no uvular deviation or uvular swelling, no trismus or drooling, normal phonation, no stridor, tongue sits flat in the bottom of the mouth, no facial swelling, no Ludwig's angina NECK: Supple, normal ROM, no meningismus CARD:  RRR; S1 and S2 appreciated; no murmurs, no clicks, no rubs, no gallops RESP: Normal chest excursion without splinting or tachypnea; breath sounds clear and equal bilaterally; no wheezes, no rhonchi, no rales, no hypoxia or respiratory distress, speaking full sentences ABD/GI: Normal bowel sounds; non-distended; soft, non-tender, no rebound, no guarding, no peritoneal signs, no hepatosplenomegaly BACK: The back appears normal EXT: Normal ROM in all joints; no deformity noted, no edema; no cyanosis SKIN: Normal color for age and race; warm; no rash on exposed skin NEURO: Moves all extremities equally, normal speech, no facial asymmetry, ambulates with normal gait PSYCH: The patient's mood and manner are appropriate.  ____________________________________________   LABS (all labs ordered are listed, but only abnormal results are displayed)  Labs Reviewed  CBC WITH DIFFERENTIAL/PLATELET - Abnormal; Notable for the following components:      Result Value   WBC 1.7 (*)    Neutro Abs 0.7 (*)    Lymphs Abs 0.6 (*)    Abs Immature Granulocytes 0.12 (*)    All other components within normal limits  BASIC METABOLIC PANEL - Abnormal; Notable for the following components:   Glucose, Bld 102 (*)    Creatinine, Ser 1.13 (*)    Calcium 8.7 (*)    GFR, Estimated 48 (*)    All other components within normal limits   ____________________________________________  EKG   ____________________________________________  RADIOLOGY I, Lekesha Claw, personally viewed and evaluated these images (plain radiographs) as part of my medical decision making, as well as reviewing the written report by the radiologist.  ED MD interpretation:    Official radiology report(s): No results found.  ____________________________________________   PROCEDURES  Procedure(s) performed (including Critical Care):  Procedures    ____________________________________________   INITIAL IMPRESSION / ASSESSMENT AND  PLAN / ED COURSE  As part of my medical decision making, I reviewed the following data within the [place] History obtained from family, Nursing notes reviewed and incorporated, Labs reviewed, old chart reviewed, Notes from prior ED visits and [place] Controlled Substance Database         Patient here with complaints of headache, sore throat in the setting of recently tested positive for COVID-19.  No chest pain, shortness of breath, vomiting or diarrhea.  No meningismus.  No focal neurologic deficits.  Well-appearing, nontoxic, well-hydrated.  Does have history of autoimmune neutropenia.  Would  be outside treatment window for getting monoclonal antibodies as this would be started as an outpatient tomorrow and she would be almost 96 hours with symptoms at that time.  Also does not meet criteria for remdesivir as she is outside of this window as well.  She is interested however in paxlovid.  Will check renal function today prior to starting this medication.  We will also ensure that no significant change in her neutropenia.  She has no increased work of breathing, respiratory distress, shortness of breath, hypoxia here.  ED PROGRESS  Labs show leukopenia with white blood cell count of 1.7.  This appears to be near her baseline.  Creatinine minimally elevated which is also at her baseline.  Discussed with pharmacist to renally dose her Paxlovid.  He states that this can be given with an 5 days after the onset of symptoms.  Prescriptions sent to her pharmacy.  Patient reports feeling better and tolerating p.o.  Headache now gone.  Will discharge with prescription of viscous lidocaine for symptomatic relief.  Discussed return precautions and supportive care instructions.  At this time, I do not feel there is any life-threatening condition present. I have reviewed, interpreted and discussed all results (EKG, imaging, lab, urine as appropriate) and exam findings with patient/family. I have  reviewed nursing notes and appropriate previous records.  I feel the patient is safe to be discharged home without further emergent workup and can continue workup as an outpatient as needed. Discussed usual and customary return precautions. Patient/family verbalize understanding and are comfortable with this plan.  Outpatient follow-up has been provided as needed. All questions have been answered.  ____________________________________________   FINAL CLINICAL IMPRESSION(S) / ED DIAGNOSES  Final diagnoses:  COVID-19  Pharyngitis, unspecified etiology     ED Discharge Orders         Ordered    lidocaine (XYLOCAINE) 2 % solution  Every 4 hours PRN        09/13/20 0659    nirmatrelvir/ritonavir EUA (PAXLOVID) TABS  2 times daily        09/13/20 0700    nirmatrelvir/ritonavir EUA (PAXLOVID) TABS        Pending          *Please note:  [name] was evaluated in Emergency Department on 09/13/2020 for the symptoms described in the history of present illness. She was evaluated in the context of the global COVID-19 pandemic, which necessitated consideration that the patient might be at risk for infection with the SARS-CoV-2 virus that causes COVID-19. Institutional protocols and algorithms that pertain to the evaluation of patients at risk for COVID-19 are in a state of rapid change based on information released by regulatory bodies including the CDC and federal and state organizations. These policies and algorithms were followed during the patient's care in the ED.  Some ED evaluations and interventions may be delayed as a result of limited staffing during and the pandemic.*   Note:  This document was prepared using Dragon voice recognition software and may include unintentional dictation errors.   Wilman Tucker, Delice Bison, DO 09/13/20 [phone]

## 2020-09-14 ENCOUNTER — Inpatient Hospital Stay: Payer: Medicare Other

## 2020-09-14 ENCOUNTER — Telehealth: Payer: Self-pay | Admitting: *Deleted

## 2020-09-14 ENCOUNTER — Telehealth: Payer: Self-pay | Admitting: Internal Medicine

## 2020-09-14 NOTE — Telephone Encounter (Signed)
Pt left VM that she has been dx with Covid. Will need to reschedule her 4/19 lab and inj appt.

## 2020-09-14 NOTE — Telephone Encounter (Signed)
Patient called. Dx with covid yesterday. Labs drawn in ER. Pt requesting to cnl today's apts.  Please advise regarding apts for next week for lab/zarxio.

## 2020-09-14 NOTE — Telephone Encounter (Signed)
[facility] Cnl next week's apt as well per Dr. Rogue Bussing- please let pt know.

## 2020-09-21 ENCOUNTER — Inpatient Hospital Stay: Payer: Medicare Other

## 2020-09-28 ENCOUNTER — Other Ambulatory Visit: Payer: Self-pay

## 2020-09-28 ENCOUNTER — Encounter: Payer: Self-pay | Admitting: Internal Medicine

## 2020-09-28 ENCOUNTER — Inpatient Hospital Stay: Payer: Medicare Other

## 2020-09-28 ENCOUNTER — Inpatient Hospital Stay: Payer: Medicare Other | Attending: Internal Medicine

## 2020-09-28 ENCOUNTER — Inpatient Hospital Stay (HOSPITAL_BASED_OUTPATIENT_CLINIC_OR_DEPARTMENT_OTHER): Payer: Medicare Other | Admitting: Internal Medicine

## 2020-09-28 VITALS — BP 169/58 | HR 81 | Temp 97.3°F | Resp 20 | Ht 65.0 in

## 2020-09-28 DIAGNOSIS — D708 Other neutropenia: Secondary | ICD-10-CM | POA: Insufficient documentation

## 2020-09-28 DIAGNOSIS — I700 Atherosclerosis of aorta: Secondary | ICD-10-CM | POA: Diagnosis not present

## 2020-09-28 DIAGNOSIS — Z87891 Personal history of nicotine dependence: Secondary | ICD-10-CM | POA: Insufficient documentation

## 2020-09-28 DIAGNOSIS — Z79899 Other long term (current) drug therapy: Secondary | ICD-10-CM | POA: Diagnosis not present

## 2020-09-28 DIAGNOSIS — K760 Fatty (change of) liver, not elsewhere classified: Secondary | ICD-10-CM | POA: Insufficient documentation

## 2020-09-28 DIAGNOSIS — Z811 Family history of alcohol abuse and dependence: Secondary | ICD-10-CM | POA: Insufficient documentation

## 2020-09-28 DIAGNOSIS — R109 Unspecified abdominal pain: Secondary | ICD-10-CM | POA: Diagnosis not present

## 2020-09-28 DIAGNOSIS — Z800 Family history of malignant neoplasm of digestive organs: Secondary | ICD-10-CM | POA: Insufficient documentation

## 2020-09-28 DIAGNOSIS — Z840 Family history of diseases of the skin and subcutaneous tissue: Secondary | ICD-10-CM | POA: Diagnosis not present

## 2020-09-28 DIAGNOSIS — M316 Other giant cell arteritis: Secondary | ICD-10-CM | POA: Insufficient documentation

## 2020-09-28 DIAGNOSIS — K435 Parastomal hernia without obstruction or  gangrene: Secondary | ICD-10-CM | POA: Insufficient documentation

## 2020-09-28 DIAGNOSIS — H9201 Otalgia, right ear: Secondary | ICD-10-CM | POA: Insufficient documentation

## 2020-09-28 DIAGNOSIS — D72819 Decreased white blood cell count, unspecified: Secondary | ICD-10-CM

## 2020-09-28 DIAGNOSIS — H9190 Unspecified hearing loss, unspecified ear: Secondary | ICD-10-CM

## 2020-09-28 LAB — CBC WITH DIFFERENTIAL/PLATELET
Abs Immature Granulocytes: 0.04 10*3/uL (ref 0.00–0.07)
Basophils Absolute: 0 10*3/uL (ref 0.0–0.1)
Basophils Relative: 2 %
Eosinophils Absolute: 0.1 10*3/uL (ref 0.0–0.5)
Eosinophils Relative: 2 %
HCT: 41 % (ref 36.0–46.0)
Hemoglobin: 12.8 g/dL (ref 12.0–15.0)
Immature Granulocytes: 2 %
Lymphocytes Relative: 39 %
Lymphs Abs: 0.9 10*3/uL (ref 0.7–4.0)
MCH: 29.1 pg (ref 26.0–34.0)
MCHC: 31.2 g/dL (ref 30.0–36.0)
MCV: 93.2 fL (ref 80.0–100.0)
Monocytes Absolute: 0.3 10*3/uL (ref 0.1–1.0)
Monocytes Relative: 14 %
Neutro Abs: 1 10*3/uL — ABNORMAL LOW (ref 1.7–7.7)
Neutrophils Relative %: 41 %
Platelets: 214 10*3/uL (ref 150–400)
RBC: 4.4 MIL/uL (ref 3.87–5.11)
RDW: 15 % (ref 11.5–15.5)
WBC: 2.4 10*3/uL — ABNORMAL LOW (ref 4.0–10.5)
nRBC: 0 % (ref 0.0–0.2)

## 2020-09-28 LAB — BASIC METABOLIC PANEL
Anion gap: 12 (ref 5–15)
BUN: 11 mg/dL (ref 8–23)
CO2: 28 mmol/L (ref 22–32)
Calcium: 8.8 mg/dL — ABNORMAL LOW (ref 8.9–10.3)
Chloride: 101 mmol/L (ref 98–111)
Creatinine, Ser: 0.91 mg/dL (ref 0.44–1.00)
GFR, Estimated: >60 mL/min (ref >60)
Glucose, Bld: 102 mg/dL — ABNORMAL HIGH (ref 70–99)
Potassium: 4.3 mmol/L (ref 3.5–5.1)
Sodium: 141 mmol/L (ref 135–145)

## 2020-09-28 MED ORDER — FILGRASTIM-SNDZ 480 MCG/0.8ML IJ SOSY
480.0000 ug | PREFILLED_SYRINGE | Freq: Once | INTRAMUSCULAR | Status: AC
  Administered 2020-09-28: 480 ug via SUBCUTANEOUS
  Filled 2020-09-28: qty 0.8

## 2020-09-28 NOTE — Assessment & Plan Note (Addendum)
#   Autoimmune neutropenia- once a week- if ANC < 1.5- zaxio-patient's WBC- today-2.2; ANC- 0.7;  Continue zarxio today; otherwise continue weekly zarxio.  Patient clinically asymptomatic.  Continue cbc/zarxioweekly/pt pref.  Stable; continue prednisone 5 mg/day [will fill]; continue at current dose.   # COVID [sore throat] s/p paxlovid-symptoms resolved.  Discussed re: COVID EVUSHELD PROPHYLAXIS: Given the patient's immunodeficiency I would recommend EVUSHELD prophylaxis.  IM injection x2 [same time]-cutdown risk of Covid infection/next 6 months. Patient is vaccinated to [place].  Will refer to Ms.Causey, [place]-who will speak to the patient.  # right ear hearing loss/cerumen impaction- referral to ENR   # Hx of temporal arteritis->20 years on low dose prednisone-STABLE.   # Easy burising: sec to prednisone. STABLE   # DISPOSITION: # referral to [place] [facility] re: hearing loss # proceed with [facility] today # weekly cbc/zaxio x 8 # follow up with MD in 8  weeks- cbc/bmp-zarxio-Dr.B

## 2020-09-28 NOTE — Progress Notes (Signed)
[place] NOTE  Patient Care Team: Baxter Hire, MD as PCP - General (Internal Medicine) Emmaline Kluver., MD (Rheumatology) Dasher, Rayvon Char, MD (Dermatology) Cammie Sickle, MD as Consulting Physician (Hematology and Oncology) Carloyn Manner, MD as Consulting Physician (Otolaryngology)  CHIEF COMPLAINTS/PURPOSE OF CONSULTATION: Autoimmune neutropenia  #April 2019- Autoimmune neutropenia-weekly Granix/CBC [Dr.Crocoran]; BMBx- no malignancy [Bone marrow aspirate and biopsy on 09/04/2017 revealed a hypercellular marrow for age with trilineage hematopoiesis.  There was abundant mature neutrophils.  Significant dyspoiesis or increased blasts was not identified.  Flow cytometry revealed a predominance of T lymphocytes (16% of all cells) with no abnormal phenotype.  There was a minor B-cell population (13% of lymphocytes) with slight kappa light chain excess.  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    HISTORY OF PRESENTING ILLNESS:  [name] 86 y.o.  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  In the interim patient diagnosed with COVID.  Patient had a low-grade fever about sore throat.  She went to the emergency room for further evaluation.  Patient did not have pneumonia-and was not a candidate for remdesivir.  Patient received Paxlovid.  Patient was up-to-date on her COVID vaccinations.  Patient symptoms significantly improved.   Complaints of hearing difficulty on the right side/ear pain.  Review of Systems  Constitutional: Negative for chills, diaphoresis,  fever, malaise/fatigue and weight loss.  HENT: Negative for nosebleeds and sore throat.   Eyes: Negative for double vision.  Respiratory: Negative for cough, hemoptysis, sputum production, shortness of breath and wheezing.   Cardiovascular: Negative for chest pain, palpitations and orthopnea.  Gastrointestinal: Negative for blood in stool, constipation, diarrhea, heartburn, melena, nausea and vomiting.  Genitourinary: Negative for dysuria, frequency and urgency.  Musculoskeletal: Negative for back pain and joint pain.  Skin: Negative for itching.  Neurological: Positive for tingling. Negative for dizziness, focal weakness, weakness and headaches.  Endo/Heme/Allergies: Bruises/bleeds easily.  Psychiatric/Behavioral: Negative for depression. The patient is nervous/anxious. The patient does not have insomnia.      MEDICAL HISTORY:  Past Medical History:  Diagnosis Date  . Breast cancer ([place]) 1985   bilateral mastectomies  . Depression    1 time specific situation that she did not know how to deal wiht  . Diverticulosis   . Hiatal hernia   . History of Bell's palsy   . History of colon polyps   . History of COVID-19   . History of nephrolithiasis   . History of pancreatitis   . Hyperlipidemia   . IBS (irritable bowel syndrome)   . Osteoporosis    osteopenia in neck  . Skin cancer   . Temporal arteritis ([place])     SURGICAL HISTORY: Past Surgical History:  Procedure Laterality Date  . AUGMENTATION MAMMAPLASTY Bilateral 2000  . COLON RESECTION SIGMOID N/A 10/13/2017   Procedure: COLON RESECTION SIGMOID;  Surgeon: Herbert Pun, MD;  Location: [facility] ORS;  Service: General;  Laterality: N/A;  . COLOSTOMY N/A 10/13/2017   Procedure: COLOSTOMY;  Surgeon: Herbert Pun, MD;  Location: [facility] ORS;  Service: General;  Laterality: N/A;  . LAPAROSCOPIC CHOLECYSTECTOMY  2009  .  MASTECTOMY  1983   bilateral    SOCIAL HISTORY: Social History   Socioeconomic History  .  Marital status: Widowed    Spouse name: Not on file  . Number of children: 2  . Years of education: college  . Highest education level: Not on file  Occupational History  . Occupation: Engineer, production  . Occupation: Retired  Tobacco Use  . Smoking status: Former Smoker    Packs/day: 0.25    Years: 30.00    Pack years: 7.50    Types: Cigarettes    Quit date: 05/17/2005    Years since quitting: 15.3  . Smokeless tobacco: Never Used  . Tobacco comment: quit 11 years ago  Vaping Use  . Vaping Use: Never used  Substance and Sexual Activity  . Alcohol use: No    Alcohol/week: 0.0 standard drinks  . Drug use: No  . Sexual activity: Not Currently  Other Topics Concern  . Not on file  Social History Narrative   Patient lives at home alone.    Patient is retired.    Patient has some college.    Patient has 2 children.   Social Determinants of Health   Financial Resource Strain: Not on file  Food Insecurity: Not on file  Transportation Needs: Not on file  Physical Activity: Not on file  Stress: Not on file  Social Connections: Not on file  Intimate Partner Violence: Not on file    FAMILY HISTORY: Family History  Problem Relation Age of Onset  . Colon cancer Mother 38  . Scleroderma Father 32  . Alcoholism Brother   . Stomach cancer Neg Hx     ALLERGIES:  is allergic to morphine, benzalkonium chloride, escitalopram, hydralazine, neomycin-bacitracin zn-polymyx, and bacitracin-neomycin-polymyxin.  MEDICATIONS:  Current Outpatient Medications  Medication Sig Dispense Refill  . alendronate (FOSAMAX) 70 MG tablet Take 70 mg by mouth once a week.    . ALPRAZolam (XANAX) 0.25 MG tablet Take 0.25 mg by mouth 2 (two) times daily as needed.     . Ascorbic Acid (VITAMIN C) 1000 MG tablet Take 1,000 mg by mouth daily.    Marland Kitchen aspirin EC 81 MG tablet Take 81 mg daily by mouth.    . Biotin 1000 MCG tablet Take 1,000 mcg by mouth daily.     . Cholecalciferol (VITAMIN D3) 2000 units  capsule Take 2,000 Units by mouth daily.    . Cyanocobalamin (B-12 PO) Take 2,500 mcg daily by mouth.     Karle Barr (ZARXIO) 480 MCG/0.8ML SOSY injection Inject 0.8 mLs (480 mcg total) into the skin once a week. Administer injection subcutaneously 3.2 mL 6  . lidocaine (XYLOCAINE) 2 % solution Use as directed 15 mLs in the mouth or throat every 4 (four) hours as needed for mouth pain. 150 mL 0  . predniSONE (DELTASONE) 5 MG tablet Take 5 mg by mouth daily.    . traMADol (ULTRAM) 50 MG tablet Take 50 mg by mouth daily.    Marland Kitchen ZINC OXIDE PO Take 1 tablet by mouth daily.    Marland Kitchen amLODipine (NORVASC) 5 MG tablet Take 5 mg by mouth daily. Pt states that she she increased the tablet to 5 mg daily. She was taking 2.5 mg. She stated that she thought the half tablet was not working efficiently. I asked the patient to inform her pcp that her dosage was increased.     No current facility-administered medications for this visit.    PHYSICAL EXAMINATION: ECOG PERFORMANCE STATUS: 0 -  Asymptomatic  Vitals:   09/28/20 1013  BP: (!) 169/58  Pulse: 81  Resp: 20  Temp: (!) 97.3 F (36.3 C)   There were no vitals filed for this visit.  Physical Exam Constitutional:      Comments: Alone.  HENT:     Head: Normocephalic and atraumatic.     Ears:     Comments: Right ear cerumen noted.  Left ear clear tympanic membrane    Mouth/Throat:     Pharynx: No oropharyngeal exudate.  Eyes:     Pupils: Pupils are equal, round, and reactive to light.  Cardiovascular:     Rate and Rhythm: Normal rate and regular rhythm.  Pulmonary:     Effort: No respiratory distress.     Breath sounds: No wheezing.  Abdominal:     General: Bowel sounds are normal. There is no distension.     Palpations: Abdomen is soft. There is no mass.     Tenderness: There is no abdominal tenderness. There is no guarding or rebound.     Comments: Colostomy.  Parastomal hernia.  Musculoskeletal:        General: No tenderness. Normal  range of motion.     Cervical back: Normal range of motion and neck supple.  Skin:    General: Skin is warm.     Comments: Hyperpigmented rash noted bilateral shins.  Neurological:     Mental Status: She is alert and oriented to person, place, and time.  Psychiatric:        Mood and Affect: Affect normal.     Comments: Anxious.      LABORATORY DATA:  I have reviewed the data as listed Lab Results  Component Value Date   WBC 2.4 (L) 09/28/2020   HGB 12.8 09/28/2020   HCT 41.0 09/28/2020   MCV 93.2 09/28/2020   PLT 214 09/28/2020   Recent Labs    01/06/20 0936 02/17/20 0955 04/13/20 0909 08/03/20 0956 09/09/20 1342 09/13/20 0534 09/28/20 0957  NA 140 140   < > 140  --  136 141  K 4.2 4.0   < > 4.1  --  3.9 4.3  CL 101 101   < > 102  --  99 101  CO2 28 28   < > 27  --  25 28  GLUCOSE 107* 96   < > 107*  --  102* 102*  BUN 19 15   < > 18  --  15 11  CREATININE 0.89 1.04*   < > 0.93 1.00 1.13* 0.91  CALCIUM 8.8* 8.6*   < > 8.7*  --  8.7* 8.8*  GFRNONAA 60* 50*   < > >60  --  48* >60  GFRAA >60 58*  --   --   --   --   --    < > = values in this interval not displayed.    RADIOGRAPHIC STUDIES: I have personally reviewed the radiological images as listed and agreed with the findings in the report. CT ABDOMEN PELVIS W CONTRAST  Result Date: 09/09/2020 CLINICAL DATA:  Left-sided abdominal pain for several days EXAM: CT ABDOMEN AND PELVIS WITH CONTRAST TECHNIQUE: Multidetector CT imaging of the abdomen and pelvis was performed using the standard protocol following bolus administration of intravenous contrast. CONTRAST:  167mL OMNIPAQUE IOHEXOL 300 MG/ML  SOLN COMPARISON:  04/30/2018 FINDINGS: Lower chest: Lung bases are free of acute infiltrate or sizable effusion. Previously seen nodular density has regressed and is almost imperceptible on today's  exam consistent with benign etiology. Hepatobiliary: Fatty infiltration of the liver is noted. The gallbladder has been surgically  removed. Pancreas: Unremarkable. No pancreatic ductal dilatation or surrounding inflammatory changes. Spleen: Normal in size without focal abnormality. Adrenals/Urinary Tract: Adrenal glands are within normal limits. Kidneys demonstrate a normal enhancement pattern bilaterally. Nonobstructing upper pole renal stone is noted on the left stable in appearance from the prior exam. Bladder is partially distended. Ureters show no calculi or obstructive changes. Stomach/Bowel: The appendix is within normal limits. Postsurgical changes in the distal colon are noted consistent with prior sigmoid resection. No obstructive or inflammatory changes in the colon are seen. The stomach and small bowel are within normal limits with the exception of a small sliding-type hiatal hernia. Vascular/Lymphatic: Aortic atherosclerosis. No enlarged abdominal or pelvic lymph nodes. Reproductive: Uterus and bilateral adnexa are unremarkable. Other: No abdominal wall hernia or abnormality. No abdominopelvic ascites. Musculoskeletal: Degenerative changes of lumbar spine are noted. IMPRESSION: Nonobstructing left renal stone. Small sliding-type hiatal hernia. Fatty liver. No acute abnormality to correspond with the patient's given clinical symptomatology is seen. Electronically Signed   By: Inez Catalina M.D.   On: 09/09/2020 14:22    ASSESSMENT & PLAN:   Autoimmune neutropenia (HCC) # Autoimmune neutropenia- once a week- if ANC < 1.5- zaxio-patient's WBC- today-2.2; ANC- 0.7;  Continue zarxio today; otherwise continue weekly zarxio.  Patient clinically asymptomatic.  Continue cbc/zarxioweekly/pt pref.  Stable; continue prednisone 5 mg/day [will fill]; continue at current dose.   # COVID [sore throat] s/p paxlovid-symptoms resolved.  Discussed re: COVID EVUSHELD PROPHYLAXIS: Given the patient's immunodeficiency I would recommend EVUSHELD prophylaxis.  IM injection x2 [same time]-cutdown risk of Covid infection/next 6 months. Patient is  vaccinated to [place].  Will refer to Ms.Causey, [place]-who will speak to the patient.  # right ear hearing loss/cerumen impaction- referral to ENR   # Hx of temporal arteritis->20 years on low dose prednisone-STABLE.   # Easy burising: sec to prednisone. STABLE   # DISPOSITION: # referral to [facility] re: hearing loss # proceed with [facility] today # weekly cbc/zaxio x 8 # follow up with MD in 8  weeks- cbc/bmp-zarxio-Dr.B  All questions were answered. The patient knows to call the clinic with any problems, questions or concerns.    Cammie Sickle, MD 09/28/2020 11:46 AM

## 2020-10-04 ENCOUNTER — Telehealth: Payer: Self-pay | Admitting: Adult Health

## 2020-10-04 ENCOUNTER — Other Ambulatory Visit (HOSPITAL_BASED_OUTPATIENT_CLINIC_OR_DEPARTMENT_OTHER): Payer: Self-pay

## 2020-10-04 NOTE — Telephone Encounter (Signed)
I called patient to discuss Evusheld, a long acting monoclonal antibody injection administered to patients with decreased immune systems or intolerance/allergy to the COVID 19 vaccine as COVID19 prevention.    Patient would like to review information, discuss with her PCP, and then will call me if she would like to proceed with the injection.    Wilber Bihari, NP

## 2020-10-05 ENCOUNTER — Inpatient Hospital Stay: Payer: Medicare Other

## 2020-10-05 DIAGNOSIS — D708 Other neutropenia: Secondary | ICD-10-CM | POA: Diagnosis not present

## 2020-10-05 DIAGNOSIS — D72819 Decreased white blood cell count, unspecified: Secondary | ICD-10-CM

## 2020-10-05 LAB — CBC WITH DIFFERENTIAL/PLATELET
Abs Immature Granulocytes: 0 10*3/uL (ref 0.00–0.07)
Band Neutrophils: 7 %
Basophils Absolute: 0 10*3/uL (ref 0.0–0.1)
Basophils Relative: 2 %
Eosinophils Absolute: 0 10*3/uL (ref 0.0–0.5)
Eosinophils Relative: 2 %
HCT: 44.9 % (ref 36.0–46.0)
Hemoglobin: 14 g/dL (ref 12.0–15.0)
Lymphocytes Relative: 38 %
Lymphs Abs: 0.9 10*3/uL (ref 0.7–4.0)
MCH: 29 pg (ref 26.0–34.0)
MCHC: 31.2 g/dL (ref 30.0–36.0)
MCV: 93 fL (ref 80.0–100.0)
Monocytes Absolute: 0.2 10*3/uL (ref 0.1–1.0)
Monocytes Relative: 7 %
Neutro Abs: 1.2 10*3/uL — ABNORMAL LOW (ref 1.7–7.7)
Neutrophils Relative %: 44 %
Platelets: 183 10*3/uL (ref 150–400)
RBC: 4.83 MIL/uL (ref 3.87–5.11)
RDW: 15.8 % — ABNORMAL HIGH (ref 11.5–15.5)
Smear Review: NORMAL
WBC: 2.4 10*3/uL — ABNORMAL LOW (ref 4.0–10.5)
nRBC: 0 % (ref 0.0–0.2)

## 2020-10-05 MED ORDER — FILGRASTIM-SNDZ 480 MCG/0.8ML IJ SOSY
480.0000 ug | PREFILLED_SYRINGE | Freq: Once | INTRAMUSCULAR | Status: AC
  Administered 2020-10-05: 480 ug via SUBCUTANEOUS
  Filled 2020-10-05: qty 0.8

## 2020-10-12 ENCOUNTER — Inpatient Hospital Stay: Payer: Medicare Other

## 2020-10-13 ENCOUNTER — Inpatient Hospital Stay: Payer: Medicare Other

## 2020-10-13 ENCOUNTER — Other Ambulatory Visit: Payer: Self-pay

## 2020-10-13 DIAGNOSIS — D708 Other neutropenia: Secondary | ICD-10-CM

## 2020-10-13 DIAGNOSIS — D72819 Decreased white blood cell count, unspecified: Secondary | ICD-10-CM

## 2020-10-13 LAB — CBC WITH DIFFERENTIAL/PLATELET
Abs Immature Granulocytes: 0 10*3/uL (ref 0.00–0.07)
Band Neutrophils: 5 %
Basophils Absolute: 0 10*3/uL (ref 0.0–0.1)
Basophils Relative: 0 %
Eosinophils Absolute: 0 10*3/uL (ref 0.0–0.5)
Eosinophils Relative: 0 %
HCT: 43.9 % (ref 36.0–46.0)
Hemoglobin: 13.8 g/dL (ref 12.0–15.0)
Lymphocytes Relative: 41 %
Lymphs Abs: 1.1 10*3/uL (ref 0.7–4.0)
MCH: 29.1 pg (ref 26.0–34.0)
MCHC: 31.4 g/dL (ref 30.0–36.0)
MCV: 92.6 fL (ref 80.0–100.0)
Monocytes Absolute: 0.2 10*3/uL (ref 0.1–1.0)
Monocytes Relative: 9 %
Myelocytes: 1 %
Neutro Abs: 1.3 10*3/uL — ABNORMAL LOW (ref 1.7–7.7)
Neutrophils Relative %: 44 %
Platelets: 205 10*3/uL (ref 150–400)
RBC: 4.74 MIL/uL (ref 3.87–5.11)
RDW: 15.9 % — ABNORMAL HIGH (ref 11.5–15.5)
Smear Review: ADEQUATE
WBC: 2.7 10*3/uL — ABNORMAL LOW (ref 4.0–10.5)
nRBC: 0 % (ref 0.0–0.2)

## 2020-10-13 MED ORDER — FILGRASTIM-SNDZ 480 MCG/0.8ML IJ SOSY
480.0000 ug | PREFILLED_SYRINGE | Freq: Once | INTRAMUSCULAR | Status: AC
  Administered 2020-10-13: 480 ug via SUBCUTANEOUS
  Filled 2020-10-13: qty 0.8

## 2020-10-19 ENCOUNTER — Other Ambulatory Visit: Payer: Self-pay

## 2020-10-19 ENCOUNTER — Inpatient Hospital Stay: Payer: Medicare Other

## 2020-10-19 DIAGNOSIS — D708 Other neutropenia: Secondary | ICD-10-CM | POA: Diagnosis not present

## 2020-10-19 LAB — CBC WITH DIFFERENTIAL/PLATELET
Abs Immature Granulocytes: 0 10*3/uL (ref 0.00–0.07)
Band Neutrophils: 2 %
Basophils Absolute: 0 10*3/uL (ref 0.0–0.1)
Basophils Relative: 1 %
Eosinophils Absolute: 0.1 10*3/uL (ref 0.0–0.5)
Eosinophils Relative: 2 %
HCT: 44.3 % (ref 36.0–46.0)
Hemoglobin: 13.9 g/dL (ref 12.0–15.0)
Lymphocytes Relative: 26 %
Lymphs Abs: 0.7 10*3/uL (ref 0.7–4.0)
MCH: 29.2 pg (ref 26.0–34.0)
MCHC: 31.4 g/dL (ref 30.0–36.0)
MCV: 93.1 fL (ref 80.0–100.0)
Monocytes Absolute: 0.2 10*3/uL (ref 0.1–1.0)
Monocytes Relative: 6 %
Myelocytes: 1 %
Neutro Abs: 1.6 10*3/uL — ABNORMAL LOW (ref 1.7–7.7)
Neutrophils Relative %: 62 %
Platelets: 215 10*3/uL (ref 150–400)
RBC: 4.76 MIL/uL (ref 3.87–5.11)
RDW: 16 % — ABNORMAL HIGH (ref 11.5–15.5)
Smear Review: NORMAL
WBC: 2.5 10*3/uL — ABNORMAL LOW (ref 4.0–10.5)
nRBC: 0 % (ref 0.0–0.2)

## 2020-10-26 ENCOUNTER — Inpatient Hospital Stay: Payer: Medicare Other

## 2020-10-26 DIAGNOSIS — D72819 Decreased white blood cell count, unspecified: Secondary | ICD-10-CM

## 2020-10-26 DIAGNOSIS — D708 Other neutropenia: Secondary | ICD-10-CM

## 2020-10-26 LAB — CBC WITH DIFFERENTIAL/PLATELET
Abs Immature Granulocytes: 0.03 10*3/uL (ref 0.00–0.07)
Basophils Absolute: 0 10*3/uL (ref 0.0–0.1)
Basophils Relative: 1 %
Eosinophils Absolute: 0.1 10*3/uL (ref 0.0–0.5)
Eosinophils Relative: 3 %
HCT: 44.4 % (ref 36.0–46.0)
Hemoglobin: 14.1 g/dL (ref 12.0–15.0)
Immature Granulocytes: 1 %
Lymphocytes Relative: 37 %
Lymphs Abs: 0.9 10*3/uL (ref 0.7–4.0)
MCH: 29.7 pg (ref 26.0–34.0)
MCHC: 31.8 g/dL (ref 30.0–36.0)
MCV: 93.5 fL (ref 80.0–100.0)
Monocytes Absolute: 0.3 10*3/uL (ref 0.1–1.0)
Monocytes Relative: 10 %
Neutro Abs: 1.2 10*3/uL — ABNORMAL LOW (ref 1.7–7.7)
Neutrophils Relative %: 48 %
Platelets: 220 10*3/uL (ref 150–400)
RBC: 4.75 MIL/uL (ref 3.87–5.11)
RDW: 16.1 % — ABNORMAL HIGH (ref 11.5–15.5)
WBC: 2.4 10*3/uL — ABNORMAL LOW (ref 4.0–10.5)
nRBC: 0 % (ref 0.0–0.2)

## 2020-10-26 MED ORDER — FILGRASTIM-SNDZ 480 MCG/0.8ML IJ SOSY
480.0000 ug | PREFILLED_SYRINGE | Freq: Once | INTRAMUSCULAR | Status: AC
  Administered 2020-10-26: 480 ug via SUBCUTANEOUS
  Filled 2020-10-26: qty 0.8

## 2020-11-02 ENCOUNTER — Inpatient Hospital Stay: Payer: Medicare Other | Attending: Internal Medicine

## 2020-11-02 ENCOUNTER — Inpatient Hospital Stay: Payer: Medicare Other

## 2020-11-02 DIAGNOSIS — Z87442 Personal history of urinary calculi: Secondary | ICD-10-CM | POA: Diagnosis not present

## 2020-11-02 DIAGNOSIS — Z87891 Personal history of nicotine dependence: Secondary | ICD-10-CM | POA: Insufficient documentation

## 2020-11-02 DIAGNOSIS — Z9049 Acquired absence of other specified parts of digestive tract: Secondary | ICD-10-CM | POA: Insufficient documentation

## 2020-11-02 DIAGNOSIS — T380X5A Adverse effect of glucocorticoids and synthetic analogues, initial encounter: Secondary | ICD-10-CM | POA: Diagnosis not present

## 2020-11-02 DIAGNOSIS — Z8616 Personal history of COVID-19: Secondary | ICD-10-CM | POA: Insufficient documentation

## 2020-11-02 DIAGNOSIS — Z885 Allergy status to narcotic agent status: Secondary | ICD-10-CM | POA: Diagnosis not present

## 2020-11-02 DIAGNOSIS — Z6372 Alcoholism and drug addiction in family: Secondary | ICD-10-CM | POA: Insufficient documentation

## 2020-11-02 DIAGNOSIS — Z800 Family history of malignant neoplasm of digestive organs: Secondary | ICD-10-CM | POA: Diagnosis not present

## 2020-11-02 DIAGNOSIS — R030 Elevated blood-pressure reading, without diagnosis of hypertension: Secondary | ICD-10-CM | POA: Diagnosis not present

## 2020-11-02 DIAGNOSIS — D692 Other nonthrombocytopenic purpura: Secondary | ICD-10-CM | POA: Diagnosis not present

## 2020-11-02 DIAGNOSIS — Z9013 Acquired absence of bilateral breasts and nipples: Secondary | ICD-10-CM | POA: Diagnosis not present

## 2020-11-02 DIAGNOSIS — Z840 Family history of diseases of the skin and subcutaneous tissue: Secondary | ICD-10-CM | POA: Insufficient documentation

## 2020-11-02 DIAGNOSIS — Z85828 Personal history of other malignant neoplasm of skin: Secondary | ICD-10-CM | POA: Insufficient documentation

## 2020-11-02 DIAGNOSIS — Z79899 Other long term (current) drug therapy: Secondary | ICD-10-CM | POA: Insufficient documentation

## 2020-11-02 DIAGNOSIS — D708 Other neutropenia: Secondary | ICD-10-CM | POA: Insufficient documentation

## 2020-11-02 DIAGNOSIS — Z8719 Personal history of other diseases of the digestive system: Secondary | ICD-10-CM | POA: Diagnosis not present

## 2020-11-02 LAB — CBC WITH DIFFERENTIAL/PLATELET
Abs Immature Granulocytes: 0.27 10*3/uL — ABNORMAL HIGH (ref 0.00–0.07)
Basophils Absolute: 0 10*3/uL (ref 0.0–0.1)
Basophils Relative: 0 %
Eosinophils Absolute: 0 10*3/uL (ref 0.0–0.5)
Eosinophils Relative: 0 %
HCT: 45.3 % (ref 36.0–46.0)
Hemoglobin: 14.3 g/dL (ref 12.0–15.0)
Immature Granulocytes: 9 %
Lymphocytes Relative: 26 %
Lymphs Abs: 0.8 10*3/uL (ref 0.7–4.0)
MCH: 29.4 pg (ref 26.0–34.0)
MCHC: 31.6 g/dL (ref 30.0–36.0)
MCV: 93.2 fL (ref 80.0–100.0)
Monocytes Absolute: 0.2 10*3/uL (ref 0.1–1.0)
Monocytes Relative: 7 %
Neutro Abs: 1.7 10*3/uL (ref 1.7–7.7)
Neutrophils Relative %: 58 %
Platelets: 207 10*3/uL (ref 150–400)
RBC: 4.86 MIL/uL (ref 3.87–5.11)
RDW: 15.9 % — ABNORMAL HIGH (ref 11.5–15.5)
Smear Review: NORMAL
WBC: 3 10*3/uL — ABNORMAL LOW (ref 4.0–10.5)
nRBC: 0 % (ref 0.0–0.2)

## 2020-11-08 DIAGNOSIS — H6121 Impacted cerumen, right ear: Secondary | ICD-10-CM | POA: Diagnosis not present

## 2020-11-08 DIAGNOSIS — H93291 Other abnormal auditory perceptions, right ear: Secondary | ICD-10-CM | POA: Diagnosis not present

## 2020-11-09 ENCOUNTER — Inpatient Hospital Stay: Payer: Medicare Other

## 2020-11-09 ENCOUNTER — Other Ambulatory Visit: Payer: Self-pay

## 2020-11-09 DIAGNOSIS — Z87891 Personal history of nicotine dependence: Secondary | ICD-10-CM | POA: Diagnosis not present

## 2020-11-09 DIAGNOSIS — D708 Other neutropenia: Secondary | ICD-10-CM

## 2020-11-09 DIAGNOSIS — Z79899 Other long term (current) drug therapy: Secondary | ICD-10-CM | POA: Diagnosis not present

## 2020-11-09 DIAGNOSIS — D692 Other nonthrombocytopenic purpura: Secondary | ICD-10-CM | POA: Diagnosis not present

## 2020-11-09 DIAGNOSIS — Z9013 Acquired absence of bilateral breasts and nipples: Secondary | ICD-10-CM | POA: Diagnosis not present

## 2020-11-09 DIAGNOSIS — Z8719 Personal history of other diseases of the digestive system: Secondary | ICD-10-CM | POA: Diagnosis not present

## 2020-11-09 DIAGNOSIS — D72819 Decreased white blood cell count, unspecified: Secondary | ICD-10-CM

## 2020-11-09 DIAGNOSIS — Z800 Family history of malignant neoplasm of digestive organs: Secondary | ICD-10-CM | POA: Diagnosis not present

## 2020-11-09 DIAGNOSIS — Z6372 Alcoholism and drug addiction in family: Secondary | ICD-10-CM | POA: Diagnosis not present

## 2020-11-09 DIAGNOSIS — T380X5A Adverse effect of glucocorticoids and synthetic analogues, initial encounter: Secondary | ICD-10-CM | POA: Diagnosis not present

## 2020-11-09 DIAGNOSIS — Z8616 Personal history of COVID-19: Secondary | ICD-10-CM | POA: Diagnosis not present

## 2020-11-09 DIAGNOSIS — Z885 Allergy status to narcotic agent status: Secondary | ICD-10-CM | POA: Diagnosis not present

## 2020-11-09 DIAGNOSIS — Z85828 Personal history of other malignant neoplasm of skin: Secondary | ICD-10-CM | POA: Diagnosis not present

## 2020-11-09 DIAGNOSIS — Z9049 Acquired absence of other specified parts of digestive tract: Secondary | ICD-10-CM | POA: Diagnosis not present

## 2020-11-09 DIAGNOSIS — R030 Elevated blood-pressure reading, without diagnosis of hypertension: Secondary | ICD-10-CM | POA: Diagnosis not present

## 2020-11-09 DIAGNOSIS — Z840 Family history of diseases of the skin and subcutaneous tissue: Secondary | ICD-10-CM | POA: Diagnosis not present

## 2020-11-09 DIAGNOSIS — Z87442 Personal history of urinary calculi: Secondary | ICD-10-CM | POA: Diagnosis not present

## 2020-11-09 LAB — CBC WITH DIFFERENTIAL/PLATELET
Abs Immature Granulocytes: 0.02 10*3/uL (ref 0.00–0.07)
Basophils Absolute: 0 10*3/uL (ref 0.0–0.1)
Basophils Relative: 0 %
Eosinophils Absolute: 0 10*3/uL (ref 0.0–0.5)
Eosinophils Relative: 0 %
HCT: 45 % (ref 36.0–46.0)
Hemoglobin: 14.3 g/dL (ref 12.0–15.0)
Immature Granulocytes: 1 %
Lymphocytes Relative: 32 %
Lymphs Abs: 0.7 10*3/uL (ref 0.7–4.0)
MCH: 29.7 pg (ref 26.0–34.0)
MCHC: 31.8 g/dL (ref 30.0–36.0)
MCV: 93.4 fL (ref 80.0–100.0)
Monocytes Absolute: 0.3 10*3/uL (ref 0.1–1.0)
Monocytes Relative: 12 %
Neutro Abs: 1.3 10*3/uL — ABNORMAL LOW (ref 1.7–7.7)
Neutrophils Relative %: 55 %
Platelets: 185 10*3/uL (ref 150–400)
RBC: 4.82 MIL/uL (ref 3.87–5.11)
RDW: 15.9 % — ABNORMAL HIGH (ref 11.5–15.5)
WBC: 2.3 10*3/uL — ABNORMAL LOW (ref 4.0–10.5)
nRBC: 0 % (ref 0.0–0.2)

## 2020-11-09 MED ORDER — FILGRASTIM-SNDZ 480 MCG/0.8ML IJ SOSY
480.0000 ug | PREFILLED_SYRINGE | Freq: Once | INTRAMUSCULAR | Status: AC
  Administered 2020-11-09: 480 ug via SUBCUTANEOUS
  Filled 2020-11-09: qty 0.8

## 2020-11-16 ENCOUNTER — Inpatient Hospital Stay: Payer: Medicare Other

## 2020-11-16 DIAGNOSIS — D708 Other neutropenia: Secondary | ICD-10-CM

## 2020-11-16 LAB — CBC WITH DIFFERENTIAL/PLATELET
Abs Immature Granulocytes: 0 10*3/uL (ref 0.00–0.07)
Basophils Absolute: 0 10*3/uL (ref 0.0–0.1)
Basophils Relative: 1 %
Eosinophils Absolute: 0 10*3/uL (ref 0.0–0.5)
Eosinophils Relative: 1 %
HCT: 44.6 % (ref 36.0–46.0)
Hemoglobin: 14.6 g/dL (ref 12.0–15.0)
Immature Granulocytes: 0 %
Lymphocytes Relative: 36 %
Lymphs Abs: 1 10*3/uL (ref 0.7–4.0)
MCH: 30.3 pg (ref 26.0–34.0)
MCHC: 32.7 g/dL (ref 30.0–36.0)
MCV: 92.5 fL (ref 80.0–100.0)
Monocytes Absolute: 0.3 10*3/uL (ref 0.1–1.0)
Monocytes Relative: 9 %
Neutro Abs: 1.5 10*3/uL — ABNORMAL LOW (ref 1.7–7.7)
Neutrophils Relative %: 53 %
Platelets: 211 10*3/uL (ref 150–400)
RBC: 4.82 MIL/uL (ref 3.87–5.11)
RDW: 15.5 % (ref 11.5–15.5)
Smear Review: NORMAL
WBC: 2.9 10*3/uL — ABNORMAL LOW (ref 4.0–10.5)
nRBC: 0 % (ref 0.0–0.2)

## 2020-11-22 ENCOUNTER — Other Ambulatory Visit: Payer: Self-pay | Admitting: *Deleted

## 2020-11-22 DIAGNOSIS — D708 Other neutropenia: Secondary | ICD-10-CM

## 2020-11-23 ENCOUNTER — Inpatient Hospital Stay: Payer: Medicare Other

## 2020-11-23 ENCOUNTER — Inpatient Hospital Stay (HOSPITAL_BASED_OUTPATIENT_CLINIC_OR_DEPARTMENT_OTHER): Payer: Medicare Other | Admitting: Internal Medicine

## 2020-11-23 ENCOUNTER — Other Ambulatory Visit: Payer: Self-pay

## 2020-11-23 ENCOUNTER — Encounter: Payer: Self-pay | Admitting: Internal Medicine

## 2020-11-23 DIAGNOSIS — Z9013 Acquired absence of bilateral breasts and nipples: Secondary | ICD-10-CM | POA: Diagnosis not present

## 2020-11-23 DIAGNOSIS — Z87891 Personal history of nicotine dependence: Secondary | ICD-10-CM | POA: Diagnosis not present

## 2020-11-23 DIAGNOSIS — Z800 Family history of malignant neoplasm of digestive organs: Secondary | ICD-10-CM | POA: Diagnosis not present

## 2020-11-23 DIAGNOSIS — Z85828 Personal history of other malignant neoplasm of skin: Secondary | ICD-10-CM | POA: Diagnosis not present

## 2020-11-23 DIAGNOSIS — Z6372 Alcoholism and drug addiction in family: Secondary | ICD-10-CM | POA: Diagnosis not present

## 2020-11-23 DIAGNOSIS — R030 Elevated blood-pressure reading, without diagnosis of hypertension: Secondary | ICD-10-CM | POA: Diagnosis not present

## 2020-11-23 DIAGNOSIS — Z885 Allergy status to narcotic agent status: Secondary | ICD-10-CM | POA: Diagnosis not present

## 2020-11-23 DIAGNOSIS — Z840 Family history of diseases of the skin and subcutaneous tissue: Secondary | ICD-10-CM | POA: Diagnosis not present

## 2020-11-23 DIAGNOSIS — Z9049 Acquired absence of other specified parts of digestive tract: Secondary | ICD-10-CM | POA: Diagnosis not present

## 2020-11-23 DIAGNOSIS — D708 Other neutropenia: Secondary | ICD-10-CM

## 2020-11-23 DIAGNOSIS — D72819 Decreased white blood cell count, unspecified: Secondary | ICD-10-CM

## 2020-11-23 DIAGNOSIS — D692 Other nonthrombocytopenic purpura: Secondary | ICD-10-CM | POA: Diagnosis not present

## 2020-11-23 DIAGNOSIS — Z8616 Personal history of COVID-19: Secondary | ICD-10-CM | POA: Diagnosis not present

## 2020-11-23 DIAGNOSIS — Z87442 Personal history of urinary calculi: Secondary | ICD-10-CM | POA: Diagnosis not present

## 2020-11-23 DIAGNOSIS — Z8719 Personal history of other diseases of the digestive system: Secondary | ICD-10-CM | POA: Diagnosis not present

## 2020-11-23 DIAGNOSIS — Z79899 Other long term (current) drug therapy: Secondary | ICD-10-CM | POA: Diagnosis not present

## 2020-11-23 DIAGNOSIS — T380X5A Adverse effect of glucocorticoids and synthetic analogues, initial encounter: Secondary | ICD-10-CM | POA: Diagnosis not present

## 2020-11-23 LAB — CBC WITH DIFFERENTIAL/PLATELET
Abs Immature Granulocytes: 0.02 10*3/uL (ref 0.00–0.07)
Basophils Absolute: 0 10*3/uL (ref 0.0–0.1)
Basophils Relative: 0 %
Eosinophils Absolute: 0 10*3/uL (ref 0.0–0.5)
Eosinophils Relative: 1 %
HCT: 45.8 % (ref 36.0–46.0)
Hemoglobin: 14.3 g/dL (ref 12.0–15.0)
Immature Granulocytes: 1 %
Lymphocytes Relative: 37 %
Lymphs Abs: 1 10*3/uL (ref 0.7–4.0)
MCH: 29.5 pg (ref 26.0–34.0)
MCHC: 31.2 g/dL (ref 30.0–36.0)
MCV: 94.4 fL (ref 80.0–100.0)
Monocytes Absolute: 0.3 10*3/uL (ref 0.1–1.0)
Monocytes Relative: 11 %
Neutro Abs: 1.3 10*3/uL — ABNORMAL LOW (ref 1.7–7.7)
Neutrophils Relative %: 50 %
Platelets: 209 10*3/uL (ref 150–400)
RBC: 4.85 MIL/uL (ref 3.87–5.11)
RDW: 15.2 % (ref 11.5–15.5)
WBC: 2.6 10*3/uL — ABNORMAL LOW (ref 4.0–10.5)
nRBC: 0 % (ref 0.0–0.2)

## 2020-11-23 LAB — BASIC METABOLIC PANEL
Anion gap: 9 (ref 5–15)
BUN: 19 mg/dL (ref 8–23)
CO2: 30 mmol/L (ref 22–32)
Calcium: 9.1 mg/dL (ref 8.9–10.3)
Chloride: 101 mmol/L (ref 98–111)
Creatinine, Ser: 1.03 mg/dL — ABNORMAL HIGH (ref 0.44–1.00)
GFR, Estimated: 54 mL/min — ABNORMAL LOW (ref >60)
Glucose, Bld: 103 mg/dL — ABNORMAL HIGH (ref 70–99)
Potassium: 4.2 mmol/L (ref 3.5–5.1)
Sodium: 140 mmol/L (ref 135–145)

## 2020-11-23 MED ORDER — FILGRASTIM-SNDZ 480 MCG/0.8ML IJ SOSY
480.0000 ug | PREFILLED_SYRINGE | Freq: Once | INTRAMUSCULAR | Status: AC
  Administered 2020-11-23: 480 ug via SUBCUTANEOUS
  Filled 2020-11-23: qty 0.8

## 2020-11-23 NOTE — Progress Notes (Signed)
Has some discoloration on arms and legs that she wants to know if it is related to zarxio.

## 2020-11-23 NOTE — Assessment & Plan Note (Addendum)
#   Autoimmune neutropenia- once a week- if ANC < 1.5- zarxio-patient's WBC- today-2.6; ANC- 1.3  Continue zarxio today; otherwise continue weekly zarxio.  Patient clinically asymptomatic.  Continue cbc/zarxioweekly/pt pref.  Stable continue prednisone 5 mg/day [re- fill]; continue at current dose.   # COVID [sore throat] s/p paxlovid-symptoms resolved.  Discussed re: COVID EVUSHELD PROPHYLAXIS: Given the patient's immunodeficiency I would recommend EVUSHELD prophylaxis.  S/p counseling patient is still undecided.  # Elevated BP [at home ~130]-   # Hx of temporal arteritis->20 years on low dose prednisone-S stable  # Easy burising Bil LE/Upper extremity: sec to prednisone/senile purpura.STABLE.   # DISPOSITION: # proceed with ZARXIO today # weekly cbc/zaxio x 8 # follow up with MD in 8  weeks- cbc/bmp-zarxio-Dr.B

## 2020-11-23 NOTE — Progress Notes (Signed)
[place] NOTE  Patient Care Team: Baxter Hire, MD as PCP - General (Internal Medicine) Emmaline Kluver., MD (Rheumatology) Dasher, Rayvon Char, MD (Dermatology) Cammie Sickle, MD as Consulting Physician (Hematology and Oncology) Carloyn Manner, MD as Consulting Physician (Otolaryngology)  CHIEF COMPLAINTS/PURPOSE OF CONSULTATION: Autoimmune neutropenia  #April 2019- Autoimmune neutropenia-weekly Granix/CBC [Dr.Crocoran]; BMBx- no malignancy [Bone marrow aspirate and biopsy on 09/04/2017 revealed a hypercellular marrow for age with trilineage hematopoiesis.  There was abundant mature neutrophils.  Significant dyspoiesis or increased blasts was not identified.  Flow cytometry revealed a predominance of T lymphocytes (16% of all cells) with no abnormal phenotype.  There was a minor B-cell population (13% of lymphocytes) with slight kappa light chain excess.  Cytogenetics revealed 36, XX, del(20)(q11.2)[2] / 46,XX[18].]; weekly cbc/granix [until July 2020]; Aug 2020- Zarxio;  June 2021-prednisone 10 mg a day; response noted' Ju;y 13th 2021- --prednisone 5 mg a day  #   temporal arteritis [2003] chronic prednisone/ 2.5 mg a day; Dr.Kernodle; SEP 2021- Prednisone 5mg  [refill]  #April 2022-COVID infection [vaccinated]; EVUSHELD-undecided  Oncology History   No history exists.    HISTORY OF PRESENTING ILLNESS:  [name] 86 y.o.  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  Patient continues to complain of easy bruising-with minor trauma.   Denies any worsening respiratory symptoms or cough or shortness of breath.  Review of Systems  Constitutional:  Negative for chills, diaphoresis, fever, malaise/fatigue and weight loss.  HENT:  Negative for nosebleeds and sore throat.   Eyes:  Negative for double vision.  Respiratory:  Negative for cough, hemoptysis, sputum production, shortness  of breath and wheezing.   Cardiovascular:  Negative for chest pain, palpitations and orthopnea.  Gastrointestinal:  Negative for blood in stool, constipation, diarrhea, heartburn, melena, nausea and vomiting.  Genitourinary:  Negative for dysuria, frequency and urgency.  Musculoskeletal:  Negative for back pain and joint pain.  Skin:  Negative for itching.  Neurological:  Positive for tingling. Negative for dizziness, focal weakness, weakness and headaches.  Endo/Heme/Allergies:  Bruises/bleeds easily.  Psychiatric/Behavioral:  Negative for depression. The patient is nervous/anxious. The patient does not have insomnia.     MEDICAL HISTORY:  Past Medical History:  Diagnosis Date  . Breast cancer ([place]) 1985   bilateral mastectomies  . Depression    1 time specific situation that she did not know how to deal wiht  . Diverticulosis   . Hiatal hernia   . History of Bell's palsy   . History of colon polyps   . History of COVID-19   . History of nephrolithiasis   . History of pancreatitis   . Hyperlipidemia   . IBS (irritable bowel syndrome)   . Osteoporosis    osteopenia in neck  . Skin cancer   . Temporal arteritis ([place])     SURGICAL HISTORY: Past Surgical History:  Procedure Laterality Date  . AUGMENTATION MAMMAPLASTY Bilateral 2000  . COLON RESECTION SIGMOID N/A 10/13/2017   Procedure: COLON RESECTION SIGMOID;  Surgeon: Herbert Pun, MD;  Location: [facility] ORS;  Service: General;  Laterality: N/A;  . COLOSTOMY N/A 10/13/2017   Procedure: COLOSTOMY;  Surgeon: Herbert Pun, MD;  Location: [facility] ORS;  Service: General;  Laterality: N/A;  . LAPAROSCOPIC CHOLECYSTECTOMY  2009  . MASTECTOMY  1983   bilateral    SOCIAL HISTORY: Social History   Socioeconomic History  . Marital status: Widowed    Spouse name:  Not on file  . Number of children: 2  . Years of education: college  . Highest education level: Not on file  Occupational History  . Occupation:  Engineer, production  . Occupation: Retired  Tobacco Use  . Smoking status: Former    Packs/day: 0.25    Years: 30.00    Pack years: 7.50    Types: Cigarettes    Quit date: 05/17/2005    Years since quitting: 15.5  . Smokeless tobacco: Never  . Tobacco comments:    quit 11 years ago  Vaping Use  . Vaping Use: Never used  Substance and Sexual Activity  . Alcohol use: No    Alcohol/week: 0.0 standard drinks  . Drug use: No  . Sexual activity: Not Currently  Other Topics Concern  . Not on file  Social History Narrative   Patient lives at home alone.    Patient is retired.    Patient has some college.    Patient has 2 children.   Social Determinants of Health   Financial Resource Strain: Not on file  Food Insecurity: Not on file  Transportation Needs: Not on file  Physical Activity: Not on file  Stress: Not on file  Social Connections: Not on file  Intimate Partner Violence: Not on file    FAMILY HISTORY: Family History  Problem Relation Age of Onset  . Colon cancer Mother 65  . Scleroderma Father 65  . Alcoholism Brother   . Stomach cancer Neg Hx     ALLERGIES:  is allergic to morphine, benzalkonium chloride, escitalopram, hydralazine, neomycin-bacitracin zn-polymyx, and bacitracin-neomycin-polymyxin.  MEDICATIONS:  Current Outpatient Medications  Medication Sig Dispense Refill  . alendronate (FOSAMAX) 70 MG tablet Take 70 mg by mouth once a week.    . ALPRAZolam (XANAX) 0.25 MG tablet Take 0.25 mg by mouth 2 (two) times daily as needed.     . Ascorbic Acid (VITAMIN C) 1000 MG tablet Take 1,000 mg by mouth daily.    Marland Kitchen aspirin EC 81 MG tablet Take 81 mg daily by mouth.    . Biotin 1000 MCG tablet Take 1,000 mcg by mouth daily.     . Cholecalciferol (VITAMIN D3) 2000 units capsule Take 2,000 Units by mouth daily.    . Cyanocobalamin (B-12 PO) Take 2,500 mcg daily by mouth.     Karle Barr (ZARXIO) 480 MCG/0.8ML SOSY injection Inject 0.8 mLs (480 mcg total) into the  skin once a week. Administer injection subcutaneously 3.2 mL 6  . lidocaine (XYLOCAINE) 2 % solution Use as directed 15 mLs in the mouth or throat every 4 (four) hours as needed for mouth pain. 150 mL 0  . predniSONE (DELTASONE) 5 MG tablet Take 5 mg by mouth daily.    . traMADol (ULTRAM) 50 MG tablet Take 50 mg by mouth daily.    Marland Kitchen ZINC OXIDE PO Take 1 tablet by mouth daily.    Marland Kitchen amLODipine (NORVASC) 5 MG tablet Take 5 mg by mouth daily. Pt states that she she increased the tablet to 5 mg daily. She was taking 2.5 mg. She stated that she thought the half tablet was not working efficiently. I asked the patient to inform her pcp that her dosage was increased.     No current facility-administered medications for this visit.    PHYSICAL EXAMINATION: ECOG PERFORMANCE STATUS: 0 - Asymptomatic  Vitals:   11/23/20 1033  BP: (!) 172/67  Pulse: 76  Resp: 16  Temp: 97.7 F (36.5 C)  SpO2: 100%  Filed Weights   11/23/20 1033  Weight: 170 lb (77.1 kg)    Physical Exam Constitutional:      Comments: Alone.  HENT:     Head: Normocephalic and atraumatic.     Mouth/Throat:     Pharynx: No oropharyngeal exudate.  Eyes:     Pupils: Pupils are equal, round, and reactive to light.  Cardiovascular:     Rate and Rhythm: Normal rate and regular rhythm.  Pulmonary:     Effort: No respiratory distress.     Breath sounds: No wheezing.  Abdominal:     General: Bowel sounds are normal. There is no distension.     Palpations: Abdomen is soft. There is no mass.     Tenderness: There is no abdominal tenderness. There is no guarding or rebound.     Comments: Colostomy.  Parastomal hernia.  Musculoskeletal:        General: No tenderness. Normal range of motion.     Cervical back: Normal range of motion and neck supple.  Skin:    General: Skin is warm.     Comments: Hyperpigmented rash noted bilateral shins.  Right arm bruising/thin skin.  Neurological:     Mental Status: She is alert and  oriented to person, place, and time.  Psychiatric:        Mood and Affect: Affect normal.     Comments: Anxious.     LABORATORY DATA:  I have reviewed the data as listed Lab Results  Component Value Date   WBC 2.6 (L) 11/23/2020   HGB 14.3 11/23/2020   HCT 45.8 11/23/2020   MCV 94.4 11/23/2020   PLT 209 11/23/2020   Recent Labs    01/06/20 0936 02/17/20 0955 04/13/20 0909 09/13/20 0534 09/28/20 0957 11/23/20 1010  NA 140 140   < > 136 141 140  K 4.2 4.0   < > 3.9 4.3 4.2  CL 101 101   < > 99 101 101  CO2 28 28   < > 25 28 30   GLUCOSE 107* 96   < > 102* 102* 103*  BUN 19 15   < > 15 11 19   CREATININE 0.89 1.04*   < > 1.13* 0.91 1.03*  CALCIUM 8.8* 8.6*   < > 8.7* 8.8* 9.1  GFRNONAA 60* 50*   < > 48* >60 54*  GFRAA >60 58*  --   --   --   --    < > = values in this interval not displayed.    RADIOGRAPHIC STUDIES: I have personally reviewed the radiological images as listed and agreed with the findings in the report. No results found.   ASSESSMENT & PLAN:   Autoimmune neutropenia (HCC) # Autoimmune neutropenia- once a week- if ANC < 1.5- zarxio-patient's WBC- today-2.6; ANC- 1.3  Continue zarxio today; otherwise continue weekly zarxio.  Patient clinically asymptomatic.  Continue cbc/zarxioweekly/pt pref.  Stable continue prednisone 5 mg/day [re- fill]; continue at current dose.   # COVID [sore throat] s/p paxlovid-symptoms resolved.  Discussed re: COVID EVUSHELD PROPHYLAXIS: Given the patient's immunodeficiency I would recommend EVUSHELD prophylaxis.  S/p counseling patient is still undecided.  # Elevated BP [at home ~130]-   # Hx of temporal arteritis->20 years on low dose prednisone-S stable  # Easy burising Bil LE/Upper extremity: sec to prednisone/senile purpura.STABLE.   # DISPOSITION: # proceed with ZARXIO today # weekly cbc/zaxio x 8 # follow up with MD in 8  weeks- cbc/bmp-zarxio-Dr.B  All questions were answered. The  patient knows to call the clinic  with any problems, questions or concerns.    Cammie Sickle, MD 11/24/2020 8:11 AM

## 2020-11-24 ENCOUNTER — Encounter: Payer: Self-pay | Admitting: Internal Medicine

## 2020-11-30 ENCOUNTER — Inpatient Hospital Stay: Payer: Medicare Other

## 2020-11-30 ENCOUNTER — Inpatient Hospital Stay: Payer: Medicare Other | Attending: Internal Medicine

## 2020-11-30 DIAGNOSIS — D708 Other neutropenia: Secondary | ICD-10-CM | POA: Diagnosis not present

## 2020-11-30 LAB — CBC WITH DIFFERENTIAL/PLATELET
Abs Immature Granulocytes: 0 10*3/uL (ref 0.00–0.07)
Band Neutrophils: 5 %
Basophils Absolute: 0 10*3/uL (ref 0.0–0.1)
Basophils Relative: 0 %
Eosinophils Absolute: 0 10*3/uL (ref 0.0–0.5)
Eosinophils Relative: 0 %
HCT: 44.7 % (ref 36.0–46.0)
Hemoglobin: 14.2 g/dL (ref 12.0–15.0)
Lymphocytes Relative: 33 %
Lymphs Abs: 1 10*3/uL (ref 0.7–4.0)
MCH: 29.6 pg (ref 26.0–34.0)
MCHC: 31.8 g/dL (ref 30.0–36.0)
MCV: 93.3 fL (ref 80.0–100.0)
Monocytes Absolute: 0.2 10*3/uL (ref 0.1–1.0)
Monocytes Relative: 5 %
Neutro Abs: 1.9 10*3/uL (ref 1.7–7.7)
Neutrophils Relative %: 57 %
Platelets: 211 10*3/uL (ref 150–400)
RBC: 4.79 MIL/uL (ref 3.87–5.11)
RDW: 15.3 % (ref 11.5–15.5)
Smear Review: ADEQUATE
WBC: 3 10*3/uL — ABNORMAL LOW (ref 4.0–10.5)
nRBC: 0 % (ref 0.0–0.2)

## 2020-12-07 ENCOUNTER — Inpatient Hospital Stay: Payer: Medicare Other

## 2020-12-07 DIAGNOSIS — D72819 Decreased white blood cell count, unspecified: Secondary | ICD-10-CM

## 2020-12-07 DIAGNOSIS — D708 Other neutropenia: Secondary | ICD-10-CM | POA: Diagnosis not present

## 2020-12-07 LAB — CBC WITH DIFFERENTIAL/PLATELET
Abs Immature Granulocytes: 0.02 10*3/uL (ref 0.00–0.07)
Basophils Absolute: 0 10*3/uL (ref 0.0–0.1)
Basophils Relative: 1 %
Eosinophils Absolute: 0.1 10*3/uL (ref 0.0–0.5)
Eosinophils Relative: 2 %
HCT: 45.2 % (ref 36.0–46.0)
Hemoglobin: 14.2 g/dL (ref 12.0–15.0)
Immature Granulocytes: 1 %
Lymphocytes Relative: 37 %
Lymphs Abs: 0.9 10*3/uL (ref 0.7–4.0)
MCH: 29.7 pg (ref 26.0–34.0)
MCHC: 31.4 g/dL (ref 30.0–36.0)
MCV: 94.6 fL (ref 80.0–100.0)
Monocytes Absolute: 0.3 10*3/uL (ref 0.1–1.0)
Monocytes Relative: 13 %
Neutro Abs: 1.1 10*3/uL — ABNORMAL LOW (ref 1.7–7.7)
Neutrophils Relative %: 46 %
Platelets: 210 10*3/uL (ref 150–400)
RBC: 4.78 MIL/uL (ref 3.87–5.11)
RDW: 14.9 % (ref 11.5–15.5)
WBC: 2.4 10*3/uL — ABNORMAL LOW (ref 4.0–10.5)
nRBC: 0 % (ref 0.0–0.2)

## 2020-12-07 MED ORDER — FILGRASTIM-SNDZ 480 MCG/0.8ML IJ SOSY
480.0000 ug | PREFILLED_SYRINGE | Freq: Once | INTRAMUSCULAR | Status: AC
Start: 2020-12-07 — End: 2020-12-07
  Administered 2020-12-07: 480 ug via SUBCUTANEOUS
  Filled 2020-12-07: qty 0.8

## 2020-12-14 ENCOUNTER — Inpatient Hospital Stay: Payer: Medicare Other

## 2020-12-14 ENCOUNTER — Other Ambulatory Visit: Payer: Self-pay

## 2020-12-14 DIAGNOSIS — D708 Other neutropenia: Secondary | ICD-10-CM | POA: Diagnosis not present

## 2020-12-14 LAB — CBC WITH DIFFERENTIAL/PLATELET
Abs Immature Granulocytes: 0.19 10*3/uL — ABNORMAL HIGH (ref 0.00–0.07)
Basophils Absolute: 0 10*3/uL (ref 0.0–0.1)
Basophils Relative: 1 %
Eosinophils Absolute: 0 10*3/uL (ref 0.0–0.5)
Eosinophils Relative: 1 %
HCT: 46.3 % — ABNORMAL HIGH (ref 36.0–46.0)
Hemoglobin: 14.7 g/dL (ref 12.0–15.0)
Immature Granulocytes: 6 %
Lymphocytes Relative: 23 %
Lymphs Abs: 0.7 10*3/uL (ref 0.7–4.0)
MCH: 29.3 pg (ref 26.0–34.0)
MCHC: 31.7 g/dL (ref 30.0–36.0)
MCV: 92.4 fL (ref 80.0–100.0)
Monocytes Absolute: 0.2 10*3/uL (ref 0.1–1.0)
Monocytes Relative: 8 %
Neutro Abs: 2 10*3/uL (ref 1.7–7.7)
Neutrophils Relative %: 61 %
Platelets: 218 10*3/uL (ref 150–400)
RBC: 5.01 MIL/uL (ref 3.87–5.11)
RDW: 14.9 % (ref 11.5–15.5)
WBC: 3.2 10*3/uL — ABNORMAL LOW (ref 4.0–10.5)
nRBC: 0 % (ref 0.0–0.2)

## 2020-12-21 ENCOUNTER — Other Ambulatory Visit: Payer: Self-pay

## 2020-12-21 ENCOUNTER — Inpatient Hospital Stay: Payer: Medicare Other

## 2020-12-21 DIAGNOSIS — D708 Other neutropenia: Secondary | ICD-10-CM

## 2020-12-21 DIAGNOSIS — D72819 Decreased white blood cell count, unspecified: Secondary | ICD-10-CM

## 2020-12-21 LAB — CBC WITH DIFFERENTIAL/PLATELET
Abs Immature Granulocytes: 0.02 10*3/uL (ref 0.00–0.07)
Basophils Absolute: 0 10*3/uL (ref 0.0–0.1)
Basophils Relative: 1 %
Eosinophils Absolute: 0 10*3/uL (ref 0.0–0.5)
Eosinophils Relative: 1 %
HCT: 45 % (ref 36.0–46.0)
Hemoglobin: 14.1 g/dL (ref 12.0–15.0)
Immature Granulocytes: 1 %
Lymphocytes Relative: 43 %
Lymphs Abs: 1.1 10*3/uL (ref 0.7–4.0)
MCH: 29.3 pg (ref 26.0–34.0)
MCHC: 31.3 g/dL (ref 30.0–36.0)
MCV: 93.6 fL (ref 80.0–100.0)
Monocytes Absolute: 0.3 10*3/uL (ref 0.1–1.0)
Monocytes Relative: 10 %
Neutro Abs: 1.1 10*3/uL — ABNORMAL LOW (ref 1.7–7.7)
Neutrophils Relative %: 44 %
Platelets: 214 10*3/uL (ref 150–400)
RBC: 4.81 MIL/uL (ref 3.87–5.11)
RDW: 14.8 % (ref 11.5–15.5)
WBC: 2.6 10*3/uL — ABNORMAL LOW (ref 4.0–10.5)
nRBC: 0 % (ref 0.0–0.2)

## 2020-12-21 MED ORDER — FILGRASTIM-SNDZ 480 MCG/0.8ML IJ SOSY
480.0000 ug | PREFILLED_SYRINGE | Freq: Once | INTRAMUSCULAR | Status: AC
  Administered 2020-12-21: 480 ug via SUBCUTANEOUS
  Filled 2020-12-21: qty 0.8

## 2020-12-28 ENCOUNTER — Inpatient Hospital Stay: Payer: Medicare Other

## 2020-12-28 ENCOUNTER — Other Ambulatory Visit: Payer: Self-pay

## 2020-12-28 ENCOUNTER — Inpatient Hospital Stay: Payer: Medicare Other | Attending: Internal Medicine

## 2020-12-28 DIAGNOSIS — Z811 Family history of alcohol abuse and dependence: Secondary | ICD-10-CM | POA: Diagnosis not present

## 2020-12-28 DIAGNOSIS — Z87891 Personal history of nicotine dependence: Secondary | ICD-10-CM | POA: Insufficient documentation

## 2020-12-28 DIAGNOSIS — D708 Other neutropenia: Secondary | ICD-10-CM | POA: Insufficient documentation

## 2020-12-28 DIAGNOSIS — E7800 Pure hypercholesterolemia, unspecified: Secondary | ICD-10-CM | POA: Diagnosis not present

## 2020-12-28 DIAGNOSIS — Z808 Family history of malignant neoplasm of other organs or systems: Secondary | ICD-10-CM | POA: Insufficient documentation

## 2020-12-28 DIAGNOSIS — Z800 Family history of malignant neoplasm of digestive organs: Secondary | ICD-10-CM | POA: Insufficient documentation

## 2020-12-28 DIAGNOSIS — Z79899 Other long term (current) drug therapy: Secondary | ICD-10-CM | POA: Diagnosis not present

## 2020-12-28 LAB — CBC WITH DIFFERENTIAL/PLATELET
Abs Immature Granulocytes: 0.07 10*3/uL (ref 0.00–0.07)
Band Neutrophils: 0 %
Basophils Absolute: 0 10*3/uL (ref 0.0–0.1)
Basophils Relative: 0 %
Blasts: 0 %
Eosinophils Absolute: 0 10*3/uL (ref 0.0–0.5)
Eosinophils Relative: 0 %
HCT: 43.9 % (ref 36.0–46.0)
Hemoglobin: 13.9 g/dL (ref 12.0–15.0)
Lymphocytes Relative: 18 %
Lymphs Abs: 0.6 10*3/uL — ABNORMAL LOW (ref 0.7–4.0)
MCH: 29.9 pg (ref 26.0–34.0)
MCHC: 31.7 g/dL (ref 30.0–36.0)
MCV: 94.4 fL (ref 80.0–100.0)
Metamyelocytes Relative: 0 %
Monocytes Absolute: 0 10*3/uL — ABNORMAL LOW (ref 0.1–1.0)
Monocytes Relative: 1 %
Myelocytes: 2 %
Neutro Abs: 2.8 10*3/uL (ref 1.7–7.7)
Neutrophils Relative %: 79 %
Other: 0 %
Platelets: 215 10*3/uL (ref 150–400)
Promyelocytes Relative: 0 %
RBC: 4.65 MIL/uL (ref 3.87–5.11)
RDW: 14.7 % (ref 11.5–15.5)
Smear Review: ADEQUATE
WBC: 3.5 10*3/uL — ABNORMAL LOW (ref 4.0–10.5)
nRBC: 0 % (ref 0.0–0.2)
nRBC: 0 /100 WBC

## 2021-01-04 ENCOUNTER — Inpatient Hospital Stay: Payer: Medicare Other

## 2021-01-04 ENCOUNTER — Other Ambulatory Visit: Payer: Self-pay

## 2021-01-04 DIAGNOSIS — D708 Other neutropenia: Secondary | ICD-10-CM

## 2021-01-04 LAB — CBC WITH DIFFERENTIAL/PLATELET
Abs Immature Granulocytes: 0.02 10*3/uL (ref 0.00–0.07)
Basophils Absolute: 0 10*3/uL (ref 0.0–0.1)
Basophils Relative: 1 %
Eosinophils Absolute: 0 10*3/uL (ref 0.0–0.5)
Eosinophils Relative: 1 %
HCT: 43.4 % (ref 36.0–46.0)
Hemoglobin: 13.8 g/dL (ref 12.0–15.0)
Immature Granulocytes: 1 %
Lymphocytes Relative: 29 %
Lymphs Abs: 0.8 10*3/uL (ref 0.7–4.0)
MCH: 29.8 pg (ref 26.0–34.0)
MCHC: 31.8 g/dL (ref 30.0–36.0)
MCV: 93.7 fL (ref 80.0–100.0)
Monocytes Absolute: 0.3 10*3/uL (ref 0.1–1.0)
Monocytes Relative: 10 %
Neutro Abs: 1.8 10*3/uL (ref 1.7–7.7)
Neutrophils Relative %: 58 %
Platelets: 206 10*3/uL (ref 150–400)
RBC: 4.63 MIL/uL (ref 3.87–5.11)
RDW: 14.6 % (ref 11.5–15.5)
WBC: 3 10*3/uL — ABNORMAL LOW (ref 4.0–10.5)
nRBC: 0 % (ref 0.0–0.2)

## 2021-01-11 ENCOUNTER — Inpatient Hospital Stay: Payer: Medicare Other

## 2021-01-11 ENCOUNTER — Other Ambulatory Visit: Payer: Self-pay

## 2021-01-11 DIAGNOSIS — Z811 Family history of alcohol abuse and dependence: Secondary | ICD-10-CM | POA: Diagnosis not present

## 2021-01-11 DIAGNOSIS — Z79899 Other long term (current) drug therapy: Secondary | ICD-10-CM | POA: Diagnosis not present

## 2021-01-11 DIAGNOSIS — D708 Other neutropenia: Secondary | ICD-10-CM

## 2021-01-11 DIAGNOSIS — D72819 Decreased white blood cell count, unspecified: Secondary | ICD-10-CM

## 2021-01-11 DIAGNOSIS — Z808 Family history of malignant neoplasm of other organs or systems: Secondary | ICD-10-CM | POA: Diagnosis not present

## 2021-01-11 DIAGNOSIS — Z87891 Personal history of nicotine dependence: Secondary | ICD-10-CM | POA: Diagnosis not present

## 2021-01-11 DIAGNOSIS — Z800 Family history of malignant neoplasm of digestive organs: Secondary | ICD-10-CM | POA: Diagnosis not present

## 2021-01-11 LAB — CBC WITH DIFFERENTIAL/PLATELET
Abs Immature Granulocytes: 0.05 10*3/uL (ref 0.00–0.07)
Basophils Absolute: 0 10*3/uL (ref 0.0–0.1)
Basophils Relative: 1 %
Eosinophils Absolute: 0 10*3/uL (ref 0.0–0.5)
Eosinophils Relative: 0 %
HCT: 47.1 % — ABNORMAL HIGH (ref 36.0–46.0)
Hemoglobin: 14.9 g/dL (ref 12.0–15.0)
Immature Granulocytes: 2 %
Lymphocytes Relative: 36 %
Lymphs Abs: 0.8 10*3/uL (ref 0.7–4.0)
MCH: 29.4 pg (ref 26.0–34.0)
MCHC: 31.6 g/dL (ref 30.0–36.0)
MCV: 92.9 fL (ref 80.0–100.0)
Monocytes Absolute: 0.2 10*3/uL (ref 0.1–1.0)
Monocytes Relative: 8 %
Neutro Abs: 1.2 10*3/uL — ABNORMAL LOW (ref 1.7–7.7)
Neutrophils Relative %: 53 %
Platelets: 216 10*3/uL (ref 150–400)
RBC: 5.07 MIL/uL (ref 3.87–5.11)
RDW: 14.6 % (ref 11.5–15.5)
WBC: 2.3 10*3/uL — ABNORMAL LOW (ref 4.0–10.5)
nRBC: 0 % (ref 0.0–0.2)

## 2021-01-11 MED ORDER — FILGRASTIM-SNDZ 480 MCG/0.8ML IJ SOSY
480.0000 ug | PREFILLED_SYRINGE | Freq: Once | INTRAMUSCULAR | Status: AC
  Administered 2021-01-11: 480 ug via SUBCUTANEOUS
  Filled 2021-01-11: qty 0.8

## 2021-01-17 DIAGNOSIS — Z85828 Personal history of other malignant neoplasm of skin: Secondary | ICD-10-CM | POA: Diagnosis not present

## 2021-01-17 DIAGNOSIS — D225 Melanocytic nevi of trunk: Secondary | ICD-10-CM | POA: Diagnosis not present

## 2021-01-17 DIAGNOSIS — C44722 Squamous cell carcinoma of skin of right lower limb, including hip: Secondary | ICD-10-CM | POA: Diagnosis not present

## 2021-01-17 DIAGNOSIS — Z08 Encounter for follow-up examination after completed treatment for malignant neoplasm: Secondary | ICD-10-CM | POA: Diagnosis not present

## 2021-01-17 DIAGNOSIS — L821 Other seborrheic keratosis: Secondary | ICD-10-CM | POA: Diagnosis not present

## 2021-01-19 ENCOUNTER — Inpatient Hospital Stay: Payer: Medicare Other

## 2021-01-19 ENCOUNTER — Telehealth: Payer: Self-pay

## 2021-01-19 ENCOUNTER — Encounter: Payer: Self-pay | Admitting: Internal Medicine

## 2021-01-19 ENCOUNTER — Inpatient Hospital Stay: Payer: Medicare Other | Admitting: Internal Medicine

## 2021-01-19 DIAGNOSIS — D708 Other neutropenia: Secondary | ICD-10-CM

## 2021-01-19 DIAGNOSIS — D72819 Decreased white blood cell count, unspecified: Secondary | ICD-10-CM

## 2021-01-19 DIAGNOSIS — Z811 Family history of alcohol abuse and dependence: Secondary | ICD-10-CM | POA: Diagnosis not present

## 2021-01-19 DIAGNOSIS — Z79899 Other long term (current) drug therapy: Secondary | ICD-10-CM | POA: Diagnosis not present

## 2021-01-19 DIAGNOSIS — Z87891 Personal history of nicotine dependence: Secondary | ICD-10-CM | POA: Diagnosis not present

## 2021-01-19 DIAGNOSIS — Z808 Family history of malignant neoplasm of other organs or systems: Secondary | ICD-10-CM | POA: Diagnosis not present

## 2021-01-19 DIAGNOSIS — Z800 Family history of malignant neoplasm of digestive organs: Secondary | ICD-10-CM | POA: Diagnosis not present

## 2021-01-19 LAB — CBC WITH DIFFERENTIAL/PLATELET
Abs Immature Granulocytes: 0.07 10*3/uL (ref 0.00–0.07)
Basophils Absolute: 0 10*3/uL (ref 0.0–0.1)
Basophils Relative: 1 %
Eosinophils Absolute: 0 10*3/uL (ref 0.0–0.5)
Eosinophils Relative: 1 %
HCT: 47.6 % — ABNORMAL HIGH (ref 36.0–46.0)
Hemoglobin: 15.2 g/dL — ABNORMAL HIGH (ref 12.0–15.0)
Immature Granulocytes: 3 %
Lymphocytes Relative: 38 %
Lymphs Abs: 1.1 10*3/uL (ref 0.7–4.0)
MCH: 29.5 pg (ref 26.0–34.0)
MCHC: 31.9 g/dL (ref 30.0–36.0)
MCV: 92.2 fL (ref 80.0–100.0)
Monocytes Absolute: 0.2 10*3/uL (ref 0.1–1.0)
Monocytes Relative: 8 %
Neutro Abs: 1.4 10*3/uL — ABNORMAL LOW (ref 1.7–7.7)
Neutrophils Relative %: 49 %
Platelets: 193 10*3/uL (ref 150–400)
RBC: 5.16 MIL/uL — ABNORMAL HIGH (ref 3.87–5.11)
RDW: 14.4 % (ref 11.5–15.5)
Smear Review: NORMAL
WBC: 2.8 10*3/uL — ABNORMAL LOW (ref 4.0–10.5)
nRBC: 0 % (ref 0.0–0.2)

## 2021-01-19 MED ORDER — FILGRASTIM-SNDZ 480 MCG/0.8ML IJ SOSY
480.0000 ug | PREFILLED_SYRINGE | Freq: Once | INTRAMUSCULAR | Status: AC
  Administered 2021-01-19: 480 ug via SUBCUTANEOUS
  Filled 2021-01-19: qty 0.8

## 2021-01-19 NOTE — Assessment & Plan Note (Addendum)
#   Autoimmune neutropenia- once a week- if ANC < 1.5- zarxio-patient's WBC- today-2.6; ANC- 1.3  Continue zarxio today; otherwise continue weekly zarxio.  Patient clinically asymptomatic.  Continue cbc/zarxioweekly/pt pref.STABLE; continue prednisone 5 mg/day [re- fill]; continue at current dose.   # COVID [sore throat] s/p paxlovid-symptoms resolved.  Discussed re: COVID EVUSHELD PROPHYLAXIS: Given the patient's immunodeficiency I would recommend EVUSHELD prophylaxis.  S/p counseling; declined.   # Elevated BP [at home ~130]- STABLE.   # Hx of temporal arteritis->20 years on low dose prednisone-STABLE.   # Easy burising Bil LE/Upper extremity: sec to prednisone/senile purpura. STABLE.   # DISPOSITION: # proceed with ZARXIO today # weekly cbc/zaxio x 8 # follow up with MD in 8  Weeks [in [place]]- cbc/bmp-zarxio-Dr.B

## 2021-01-19 NOTE — Progress Notes (Signed)
[name] NOTE  Patient Care Team: Baxter Hire, MD as PCP - General (Internal Medicine) Emmaline Kluver., MD (Rheumatology) Dasher, Rayvon Char, MD (Dermatology) Cammie Sickle, MD as Consulting Physician (Hematology and Oncology) Carloyn Manner, MD as Consulting Physician (Otolaryngology)  CHIEF COMPLAINTS/PURPOSE OF CONSULTATION: Autoimmune neutropenia  #April 2019- Autoimmune neutropenia-weekly Granix/CBC [Dr.Crocoran]; BMBx- no malignancy [Bone marrow aspirate and biopsy on 09/04/2017 revealed a hypercellular marrow for age with trilineage hematopoiesis.  There was abundant mature neutrophils.  Significant dyspoiesis or increased blasts was not identified.  Flow cytometry revealed a predominance of T lymphocytes (16% of all cells) with no abnormal phenotype.  There was a minor B-cell population (13% of lymphocytes) with slight kappa light chain excess.  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    HISTORY OF PRESENTING ILLNESS:  [name] 86 y.o.  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  Patient continues to complain of easy bruising-with minor trauma.   Denies any worsening respiratory symptoms or cough or shortness of breath.  Review of Systems  Constitutional:  Negative for chills, diaphoresis, fever, malaise/fatigue and weight loss.  HENT:  Negative for nosebleeds and sore throat.   Eyes:  Negative for double vision.  Respiratory:  Negative for cough, hemoptysis, sputum production, shortness  of breath and wheezing.   Cardiovascular:  Negative for chest pain, palpitations and orthopnea.  Gastrointestinal:  Negative for blood in stool, constipation, diarrhea, heartburn, melena, nausea and vomiting.  Genitourinary:  Negative for dysuria, frequency and urgency.  Musculoskeletal:  Negative for back pain and joint pain.  Skin:  Negative for itching.  Neurological:  Positive for tingling. Negative for dizziness, focal weakness, weakness and headaches.  Endo/Heme/Allergies:  Bruises/bleeds easily.  Psychiatric/Behavioral:  Negative for depression. The patient is nervous/anxious. The patient does not have insomnia.     MEDICAL HISTORY:  Past Medical History:  Diagnosis Date   Breast cancer ([place]) 1985   bilateral mastectomies   Depression    1 time specific situation that she did not know how to deal wiht   Diverticulosis    Hiatal hernia    History of Bell's palsy    History of colon polyps    History of COVID-19    History of nephrolithiasis    History of pancreatitis    Hyperlipidemia    IBS (irritable bowel syndrome)    Osteoporosis    osteopenia in neck   Skin cancer    Temporal arteritis ([place])     SURGICAL HISTORY: Past Surgical History:  Procedure Laterality Date   AUGMENTATION MAMMAPLASTY Bilateral 2000   COLON RESECTION SIGMOID N/A 10/13/2017   Procedure: COLON RESECTION SIGMOID;  Surgeon: Herbert Pun, MD;  Location: [facility] ORS;  Service: General;  Laterality: N/A;   COLOSTOMY N/A 10/13/2017   Procedure: COLOSTOMY;  Surgeon: Herbert Pun, MD;  Location: [facility] ORS;  Service: General;  Laterality: N/A;   LAPAROSCOPIC CHOLECYSTECTOMY  2009   MASTECTOMY  1983   bilateral    SOCIAL HISTORY: Social History   Socioeconomic History   Marital status: Widowed    Spouse name:  Not on file   Number of children: 2   Years of education: college   Highest education level: Not on file  Occupational History   Occupation: Engineer, production   Occupation:  Retired  Tobacco Use   Smoking status: Former    Packs/day: 0.25    Years: 30.00    Pack years: 7.50    Types: Cigarettes    Quit date: 05/17/2005    Years since quitting: 15.7   Smokeless tobacco: Never   Tobacco comments:    quit 11 years ago  Vaping Use   Vaping Use: Never used  Substance and Sexual Activity   Alcohol use: No    Alcohol/week: 0.0 standard drinks   Drug use: No   Sexual activity: Not Currently  Other Topics Concern   Not on file  Social History Narrative   Patient lives at home alone.    Patient is retired.    Patient has some college.    Patient has 2 children.   Social Determinants of Health   Financial Resource Strain: Not on file  Food Insecurity: Not on file  Transportation Needs: Not on file  Physical Activity: Not on file  Stress: Not on file  Social Connections: Not on file  Intimate Partner Violence: Not on file    FAMILY HISTORY: Family History  Problem Relation Age of Onset   Colon cancer Mother 33   Scleroderma Father 51   Alcoholism Brother    Stomach cancer Neg Hx     ALLERGIES:  is allergic to morphine, benzalkonium chloride, escitalopram, hydralazine, neomycin-bacitracin zn-polymyx, and bacitracin-neomycin-polymyxin.  MEDICATIONS:  Current Outpatient Medications  Medication Sig Dispense Refill   alendronate (FOSAMAX) 70 MG tablet Take 70 mg by mouth once a week.     ALPRAZolam (XANAX) 0.25 MG tablet Take 0.25 mg by mouth 2 (two) times daily as needed.      Ascorbic Acid (VITAMIN C) 1000 MG tablet Take 1,000 mg by mouth daily.     aspirin EC 81 MG tablet Take 81 mg daily by mouth.     Biotin 1000 MCG tablet Take 1,000 mcg by mouth daily.      Cholecalciferol (VITAMIN D3) 2000 units capsule Take 2,000 Units by mouth daily.     Cyanocobalamin (B-12 PO) Take 2,500 mcg daily by mouth.      filgrastim-sndz (ZARXIO) 480 MCG/0.8ML SOSY injection Inject 0.8 mLs (480 mcg total) into the skin once a week. Administer injection  subcutaneously 3.2 mL 6   lidocaine (XYLOCAINE) 2 % solution Use as directed 15 mLs in the mouth or throat every 4 (four) hours as needed for mouth pain. 150 mL 0   predniSONE (DELTASONE) 5 MG tablet Take 5 mg by mouth daily.     traMADol (ULTRAM) 50 MG tablet Take 50 mg by mouth daily.     ZINC OXIDE PO Take 1 tablet by mouth daily.     amLODipine (NORVASC) 5 MG tablet Take 5 mg by mouth daily. Pt states that she she increased the tablet to 5 mg daily. She was taking 2.5 mg. She stated that she thought the half tablet was not working efficiently. I asked the patient to inform her pcp that her dosage was increased.     No current facility-administered medications for this visit.    PHYSICAL EXAMINATION: ECOG PERFORMANCE STATUS: 0 - Asymptomatic  Vitals:   01/19/21 1104  BP: (!) 174/58  Pulse: 91  Resp: 16  Temp: 98.2 F (36.8 C)  SpO2: 99%  Filed Weights   01/19/21 1104  Weight: 170 lb (77.1 kg)    Physical Exam Constitutional:      Comments: Alone.  HENT:     Head: Normocephalic and atraumatic.     Mouth/Throat:     Pharynx: No oropharyngeal exudate.  Eyes:     Pupils: Pupils are equal, round, and reactive to light.  Cardiovascular:     Rate and Rhythm: Normal rate and regular rhythm.  Pulmonary:     Effort: No respiratory distress.     Breath sounds: No wheezing.  Abdominal:     General: Bowel sounds are normal. There is no distension.     Palpations: Abdomen is soft. There is no mass.     Tenderness: There is no abdominal tenderness. There is no guarding or rebound.     Comments: Colostomy.  Parastomal hernia.  Musculoskeletal:        General: No tenderness. Normal range of motion.     Cervical back: Normal range of motion and neck supple.  Skin:    General: Skin is warm.     Comments: Hyperpigmented rash noted bilateral shins.  Right arm bruising/thin skin.  Neurological:     Mental Status: She is alert and oriented to person, place, and time.   Psychiatric:        Mood and Affect: Affect normal.     Comments: Anxious.     LABORATORY DATA:  I have reviewed the data as listed Lab Results  Component Value Date   WBC 3.4 (L) 02/22/2021   HGB 13.4 02/22/2021   HCT 43.1 02/22/2021   MCV 93.1 02/22/2021   PLT 210 02/22/2021   Recent Labs    09/13/20 0534 09/28/20 0957 11/23/20 1010  NA 136 141 140  K 3.9 4.3 4.2  CL 99 101 101  CO2 '25 28 30  '$ GLUCOSE 102* 102* 103*  BUN '15 11 19  '$ CREATININE 1.13* 0.91 1.03*  CALCIUM 8.7* 8.8* 9.1  GFRNONAA 48* >60 54*    RADIOGRAPHIC STUDIES: I have personally reviewed the radiological images as listed and agreed with the findings in the report. No results found.   ASSESSMENT & PLAN:   Autoimmune neutropenia (HCC) # Autoimmune neutropenia- once a week- if ANC < 1.5- zarxio-patient's WBC- today-2.6; ANC- 1.3  Continue zarxio today; otherwise continue weekly zarxio.  Patient clinically asymptomatic.  Continue cbc/zarxioweekly/pt pref.STABLE; continue prednisone 5 mg/day [re- fill]; continue at current dose.   # COVID [sore throat] s/p paxlovid-symptoms resolved.  Discussed re: COVID EVUSHELD PROPHYLAXIS: Given the patient's immunodeficiency I would recommend EVUSHELD prophylaxis.  S/p counseling; declined.   # Elevated BP [at home ~130]- STABLE.   # Hx of temporal arteritis->20 years on low dose prednisone-STABLE.   # Easy burising Bil LE/Upper extremity: sec to prednisone/senile purpura. STABLE.   # DISPOSITION: # proceed with ZARXIO today # weekly cbc/zaxio x 8 # follow up with MD in 8  Weeks [in [place]]- cbc/bmp-zarxio-Dr.B  All questions were answered. The patient knows to call the clinic with any problems, questions or concerns.    Cammie Sickle, MD 02/23/2021 10:00 AM

## 2021-01-25 ENCOUNTER — Inpatient Hospital Stay: Payer: Medicare Other

## 2021-01-25 DIAGNOSIS — Z800 Family history of malignant neoplasm of digestive organs: Secondary | ICD-10-CM | POA: Diagnosis not present

## 2021-01-25 DIAGNOSIS — Z811 Family history of alcohol abuse and dependence: Secondary | ICD-10-CM | POA: Diagnosis not present

## 2021-01-25 DIAGNOSIS — Z808 Family history of malignant neoplasm of other organs or systems: Secondary | ICD-10-CM | POA: Diagnosis not present

## 2021-01-25 DIAGNOSIS — D708 Other neutropenia: Secondary | ICD-10-CM

## 2021-01-25 DIAGNOSIS — Z79899 Other long term (current) drug therapy: Secondary | ICD-10-CM | POA: Diagnosis not present

## 2021-01-25 DIAGNOSIS — D72819 Decreased white blood cell count, unspecified: Secondary | ICD-10-CM

## 2021-01-25 DIAGNOSIS — Z87891 Personal history of nicotine dependence: Secondary | ICD-10-CM | POA: Diagnosis not present

## 2021-01-25 LAB — CBC WITH DIFFERENTIAL/PLATELET
Abs Immature Granulocytes: 0.03 10*3/uL (ref 0.00–0.07)
Basophils Absolute: 0 10*3/uL (ref 0.0–0.1)
Basophils Relative: 1 %
Eosinophils Absolute: 0.1 10*3/uL (ref 0.0–0.5)
Eosinophils Relative: 2 %
HCT: 46.9 % — ABNORMAL HIGH (ref 36.0–46.0)
Hemoglobin: 14.6 g/dL (ref 12.0–15.0)
Immature Granulocytes: 1 %
Lymphocytes Relative: 53 %
Lymphs Abs: 1.5 10*3/uL (ref 0.7–4.0)
MCH: 29.1 pg (ref 26.0–34.0)
MCHC: 31.1 g/dL (ref 30.0–36.0)
MCV: 93.4 fL (ref 80.0–100.0)
Monocytes Absolute: 0.3 10*3/uL (ref 0.1–1.0)
Monocytes Relative: 12 %
Neutro Abs: 0.9 10*3/uL — ABNORMAL LOW (ref 1.7–7.7)
Neutrophils Relative %: 31 %
Platelets: 217 10*3/uL (ref 150–400)
RBC: 5.02 MIL/uL (ref 3.87–5.11)
RDW: 14.6 % (ref 11.5–15.5)
WBC: 2.8 10*3/uL — ABNORMAL LOW (ref 4.0–10.5)
nRBC: 0 % (ref 0.0–0.2)

## 2021-01-25 MED ORDER — FILGRASTIM-SNDZ 480 MCG/0.8ML IJ SOSY
480.0000 ug | PREFILLED_SYRINGE | Freq: Once | INTRAMUSCULAR | Status: AC
  Administered 2021-01-25: 480 ug via SUBCUTANEOUS
  Filled 2021-01-25: qty 0.8

## 2021-01-26 ENCOUNTER — Ambulatory Visit: Payer: Medicare Other

## 2021-01-26 ENCOUNTER — Other Ambulatory Visit: Payer: Medicare Other

## 2021-02-01 ENCOUNTER — Inpatient Hospital Stay: Payer: Medicare Other

## 2021-02-01 ENCOUNTER — Inpatient Hospital Stay: Payer: Medicare Other | Attending: Internal Medicine

## 2021-02-01 DIAGNOSIS — D708 Other neutropenia: Secondary | ICD-10-CM | POA: Insufficient documentation

## 2021-02-01 DIAGNOSIS — D72819 Decreased white blood cell count, unspecified: Secondary | ICD-10-CM

## 2021-02-01 DIAGNOSIS — I10 Essential (primary) hypertension: Secondary | ICD-10-CM | POA: Diagnosis not present

## 2021-02-01 LAB — CBC WITH DIFFERENTIAL/PLATELET
Abs Immature Granulocytes: 0.04 10*3/uL (ref 0.00–0.07)
Basophils Absolute: 0 10*3/uL (ref 0.0–0.1)
Basophils Relative: 1 %
Eosinophils Absolute: 0 10*3/uL (ref 0.0–0.5)
Eosinophils Relative: 2 %
HCT: 46.3 % — ABNORMAL HIGH (ref 36.0–46.0)
Hemoglobin: 14.9 g/dL (ref 12.0–15.0)
Immature Granulocytes: 2 %
Lymphocytes Relative: 45 %
Lymphs Abs: 1.2 10*3/uL (ref 0.7–4.0)
MCH: 29.8 pg (ref 26.0–34.0)
MCHC: 32.2 g/dL (ref 30.0–36.0)
MCV: 92.6 fL (ref 80.0–100.0)
Monocytes Absolute: 0.3 10*3/uL (ref 0.1–1.0)
Monocytes Relative: 13 %
Neutro Abs: 1 10*3/uL — ABNORMAL LOW (ref 1.7–7.7)
Neutrophils Relative %: 37 %
Platelets: 201 10*3/uL (ref 150–400)
RBC: 5 MIL/uL (ref 3.87–5.11)
RDW: 14.6 % (ref 11.5–15.5)
Smear Review: NORMAL
WBC: 2.6 10*3/uL — ABNORMAL LOW (ref 4.0–10.5)
nRBC: 0 % (ref 0.0–0.2)

## 2021-02-01 MED ORDER — FILGRASTIM-SNDZ 480 MCG/0.8ML IJ SOSY
480.0000 ug | PREFILLED_SYRINGE | Freq: Once | INTRAMUSCULAR | Status: AC
  Administered 2021-02-01: 480 ug via SUBCUTANEOUS
  Filled 2021-02-01: qty 0.8

## 2021-02-02 ENCOUNTER — Ambulatory Visit: Payer: Medicare Other

## 2021-02-02 ENCOUNTER — Other Ambulatory Visit: Payer: Medicare Other

## 2021-02-07 DIAGNOSIS — R195 Other fecal abnormalities: Secondary | ICD-10-CM | POA: Diagnosis not present

## 2021-02-07 DIAGNOSIS — R1032 Left lower quadrant pain: Secondary | ICD-10-CM | POA: Diagnosis not present

## 2021-02-08 ENCOUNTER — Inpatient Hospital Stay: Payer: Medicare Other

## 2021-02-08 DIAGNOSIS — D708 Other neutropenia: Secondary | ICD-10-CM | POA: Diagnosis not present

## 2021-02-08 DIAGNOSIS — D72819 Decreased white blood cell count, unspecified: Secondary | ICD-10-CM

## 2021-02-08 DIAGNOSIS — I10 Essential (primary) hypertension: Secondary | ICD-10-CM | POA: Diagnosis not present

## 2021-02-08 LAB — CBC WITH DIFFERENTIAL/PLATELET
Abs Immature Granulocytes: 0.16 10*3/uL — ABNORMAL HIGH (ref 0.00–0.07)
Basophils Absolute: 0 10*3/uL (ref 0.0–0.1)
Basophils Relative: 0 %
Eosinophils Absolute: 0 10*3/uL (ref 0.0–0.5)
Eosinophils Relative: 2 %
HCT: 46.1 % — ABNORMAL HIGH (ref 36.0–46.0)
Hemoglobin: 14.7 g/dL (ref 12.0–15.0)
Immature Granulocytes: 6 %
Lymphocytes Relative: 33 %
Lymphs Abs: 0.9 10*3/uL (ref 0.7–4.0)
MCH: 29.5 pg (ref 26.0–34.0)
MCHC: 31.9 g/dL (ref 30.0–36.0)
MCV: 92.4 fL (ref 80.0–100.0)
Monocytes Absolute: 0.4 10*3/uL (ref 0.1–1.0)
Monocytes Relative: 14 %
Neutro Abs: 1.2 10*3/uL — ABNORMAL LOW (ref 1.7–7.7)
Neutrophils Relative %: 45 %
Platelets: 222 10*3/uL (ref 150–400)
RBC: 4.99 MIL/uL (ref 3.87–5.11)
RDW: 15.1 % (ref 11.5–15.5)
Smear Review: NORMAL
WBC: 2.6 10*3/uL — ABNORMAL LOW (ref 4.0–10.5)
nRBC: 0 % (ref 0.0–0.2)

## 2021-02-08 MED ORDER — FILGRASTIM-SNDZ 480 MCG/0.8ML IJ SOSY
480.0000 ug | PREFILLED_SYRINGE | Freq: Once | INTRAMUSCULAR | Status: AC
  Administered 2021-02-08: 480 ug via SUBCUTANEOUS
  Filled 2021-02-08: qty 0.8

## 2021-02-09 ENCOUNTER — Ambulatory Visit: Payer: Medicare Other

## 2021-02-09 ENCOUNTER — Other Ambulatory Visit: Payer: Medicare Other

## 2021-02-11 DIAGNOSIS — R195 Other fecal abnormalities: Secondary | ICD-10-CM | POA: Diagnosis not present

## 2021-02-15 ENCOUNTER — Other Ambulatory Visit: Payer: Self-pay

## 2021-02-15 ENCOUNTER — Inpatient Hospital Stay: Payer: Medicare Other

## 2021-02-15 DIAGNOSIS — D708 Other neutropenia: Secondary | ICD-10-CM | POA: Diagnosis not present

## 2021-02-15 DIAGNOSIS — D72819 Decreased white blood cell count, unspecified: Secondary | ICD-10-CM

## 2021-02-15 DIAGNOSIS — I10 Essential (primary) hypertension: Secondary | ICD-10-CM | POA: Diagnosis not present

## 2021-02-15 LAB — CBC WITH DIFFERENTIAL/PLATELET
Abs Immature Granulocytes: 0.02 10*3/uL (ref 0.00–0.07)
Basophils Absolute: 0 10*3/uL (ref 0.0–0.1)
Basophils Relative: 1 %
Eosinophils Absolute: 0.1 10*3/uL (ref 0.0–0.5)
Eosinophils Relative: 2 %
HCT: 46.7 % — ABNORMAL HIGH (ref 36.0–46.0)
Hemoglobin: 14.5 g/dL (ref 12.0–15.0)
Immature Granulocytes: 1 %
Lymphocytes Relative: 44 %
Lymphs Abs: 1.3 10*3/uL (ref 0.7–4.0)
MCH: 29.3 pg (ref 26.0–34.0)
MCHC: 31 g/dL (ref 30.0–36.0)
MCV: 94.3 fL (ref 80.0–100.0)
Monocytes Absolute: 0.4 10*3/uL (ref 0.1–1.0)
Monocytes Relative: 14 %
Neutro Abs: 1.1 10*3/uL — ABNORMAL LOW (ref 1.7–7.7)
Neutrophils Relative %: 38 %
Platelets: 212 10*3/uL (ref 150–400)
RBC: 4.95 MIL/uL (ref 3.87–5.11)
RDW: 15.3 % (ref 11.5–15.5)
Smear Review: ADEQUATE
WBC: 2.8 10*3/uL — ABNORMAL LOW (ref 4.0–10.5)
nRBC: 0 % (ref 0.0–0.2)

## 2021-02-15 MED ORDER — FILGRASTIM-SNDZ 480 MCG/0.8ML IJ SOSY
480.0000 ug | PREFILLED_SYRINGE | Freq: Once | INTRAMUSCULAR | Status: AC
  Administered 2021-02-15: 480 ug via SUBCUTANEOUS
  Filled 2021-02-15: qty 0.8

## 2021-02-16 ENCOUNTER — Other Ambulatory Visit: Payer: Medicare Other

## 2021-02-16 ENCOUNTER — Ambulatory Visit: Payer: Medicare Other

## 2021-02-16 DIAGNOSIS — C44722 Squamous cell carcinoma of skin of right lower limb, including hip: Secondary | ICD-10-CM | POA: Diagnosis not present

## 2021-02-22 ENCOUNTER — Inpatient Hospital Stay: Payer: Medicare Other

## 2021-02-22 ENCOUNTER — Other Ambulatory Visit: Payer: Self-pay

## 2021-02-22 DIAGNOSIS — D708 Other neutropenia: Secondary | ICD-10-CM | POA: Diagnosis not present

## 2021-02-22 DIAGNOSIS — Z48817 Encounter for surgical aftercare following surgery on the skin and subcutaneous tissue: Secondary | ICD-10-CM | POA: Diagnosis not present

## 2021-02-22 LAB — CBC WITH DIFFERENTIAL/PLATELET
Abs Immature Granulocytes: 0.17 10*3/uL — ABNORMAL HIGH (ref 0.00–0.07)
Basophils Absolute: 0 10*3/uL (ref 0.0–0.1)
Basophils Relative: 0 %
Eosinophils Absolute: 0 10*3/uL (ref 0.0–0.5)
Eosinophils Relative: 0 %
HCT: 43.1 % (ref 36.0–46.0)
Hemoglobin: 13.4 g/dL (ref 12.0–15.0)
Immature Granulocytes: 5 %
Lymphocytes Relative: 19 %
Lymphs Abs: 0.7 10*3/uL (ref 0.7–4.0)
MCH: 28.9 pg (ref 26.0–34.0)
MCHC: 31.1 g/dL (ref 30.0–36.0)
MCV: 93.1 fL (ref 80.0–100.0)
Monocytes Absolute: 0.3 10*3/uL (ref 0.1–1.0)
Monocytes Relative: 9 %
Neutro Abs: 2.3 10*3/uL (ref 1.7–7.7)
Neutrophils Relative %: 67 %
Platelets: 210 10*3/uL (ref 150–400)
RBC: 4.63 MIL/uL (ref 3.87–5.11)
RDW: 15.4 % (ref 11.5–15.5)
Smear Review: NORMAL
WBC: 3.4 10*3/uL — ABNORMAL LOW (ref 4.0–10.5)
nRBC: 0 % (ref 0.0–0.2)

## 2021-02-23 ENCOUNTER — Ambulatory Visit: Payer: Medicare Other

## 2021-02-23 ENCOUNTER — Other Ambulatory Visit: Payer: Medicare Other

## 2021-02-23 ENCOUNTER — Encounter: Payer: Self-pay | Admitting: Internal Medicine

## 2021-03-01 ENCOUNTER — Inpatient Hospital Stay: Payer: Medicare Other

## 2021-03-01 ENCOUNTER — Inpatient Hospital Stay: Payer: Medicare Other | Attending: Internal Medicine

## 2021-03-01 ENCOUNTER — Other Ambulatory Visit: Payer: Self-pay

## 2021-03-01 DIAGNOSIS — Z8616 Personal history of COVID-19: Secondary | ICD-10-CM | POA: Insufficient documentation

## 2021-03-01 DIAGNOSIS — Z85828 Personal history of other malignant neoplasm of skin: Secondary | ICD-10-CM | POA: Insufficient documentation

## 2021-03-01 DIAGNOSIS — T380X5A Adverse effect of glucocorticoids and synthetic analogues, initial encounter: Secondary | ICD-10-CM | POA: Insufficient documentation

## 2021-03-01 DIAGNOSIS — Z87442 Personal history of urinary calculi: Secondary | ICD-10-CM | POA: Diagnosis not present

## 2021-03-01 DIAGNOSIS — Z9049 Acquired absence of other specified parts of digestive tract: Secondary | ICD-10-CM | POA: Diagnosis not present

## 2021-03-01 DIAGNOSIS — M316 Other giant cell arteritis: Secondary | ICD-10-CM | POA: Diagnosis not present

## 2021-03-01 DIAGNOSIS — D708 Other neutropenia: Secondary | ICD-10-CM | POA: Diagnosis not present

## 2021-03-01 DIAGNOSIS — R202 Paresthesia of skin: Secondary | ICD-10-CM | POA: Insufficient documentation

## 2021-03-01 DIAGNOSIS — Z79899 Other long term (current) drug therapy: Secondary | ICD-10-CM | POA: Insufficient documentation

## 2021-03-01 DIAGNOSIS — Z23 Encounter for immunization: Secondary | ICD-10-CM | POA: Insufficient documentation

## 2021-03-01 DIAGNOSIS — D692 Other nonthrombocytopenic purpura: Secondary | ICD-10-CM | POA: Diagnosis not present

## 2021-03-01 DIAGNOSIS — Z8719 Personal history of other diseases of the digestive system: Secondary | ICD-10-CM | POA: Insufficient documentation

## 2021-03-01 DIAGNOSIS — Z885 Allergy status to narcotic agent status: Secondary | ICD-10-CM | POA: Diagnosis not present

## 2021-03-01 DIAGNOSIS — Z9013 Acquired absence of bilateral breasts and nipples: Secondary | ICD-10-CM | POA: Insufficient documentation

## 2021-03-01 LAB — CBC WITH DIFFERENTIAL/PLATELET
Abs Immature Granulocytes: 0.04 10*3/uL (ref 0.00–0.07)
Basophils Absolute: 0 10*3/uL (ref 0.0–0.1)
Basophils Relative: 1 %
Eosinophils Absolute: 0 10*3/uL (ref 0.0–0.5)
Eosinophils Relative: 0 %
HCT: 44.9 % (ref 36.0–46.0)
Hemoglobin: 14.1 g/dL (ref 12.0–15.0)
Immature Granulocytes: 1 %
Lymphocytes Relative: 22 %
Lymphs Abs: 0.6 10*3/uL — ABNORMAL LOW (ref 0.7–4.0)
MCH: 29 pg (ref 26.0–34.0)
MCHC: 31.4 g/dL (ref 30.0–36.0)
MCV: 92.4 fL (ref 80.0–100.0)
Monocytes Absolute: 0.3 10*3/uL (ref 0.1–1.0)
Monocytes Relative: 9 %
Neutro Abs: 1.9 10*3/uL (ref 1.7–7.7)
Neutrophils Relative %: 67 %
Platelets: 254 10*3/uL (ref 150–400)
RBC: 4.86 MIL/uL (ref 3.87–5.11)
RDW: 15.2 % (ref 11.5–15.5)
WBC: 2.8 10*3/uL — ABNORMAL LOW (ref 4.0–10.5)
nRBC: 0 % (ref 0.0–0.2)

## 2021-03-01 LAB — BASIC METABOLIC PANEL
Anion gap: 10 (ref 5–15)
BUN: 22 mg/dL (ref 8–23)
CO2: 28 mmol/L (ref 22–32)
Calcium: 9.2 mg/dL (ref 8.9–10.3)
Chloride: 101 mmol/L (ref 98–111)
Creatinine, Ser: 1.04 mg/dL — ABNORMAL HIGH (ref 0.44–1.00)
GFR, Estimated: 53 mL/min — ABNORMAL LOW (ref >60)
Glucose, Bld: 106 mg/dL — ABNORMAL HIGH (ref 70–99)
Potassium: 4.3 mmol/L (ref 3.5–5.1)
Sodium: 139 mmol/L (ref 135–145)

## 2021-03-02 ENCOUNTER — Other Ambulatory Visit: Payer: Medicare Other

## 2021-03-02 ENCOUNTER — Ambulatory Visit: Payer: Medicare Other

## 2021-03-08 ENCOUNTER — Other Ambulatory Visit: Payer: Self-pay

## 2021-03-08 ENCOUNTER — Inpatient Hospital Stay: Payer: Medicare Other

## 2021-03-08 DIAGNOSIS — D708 Other neutropenia: Secondary | ICD-10-CM | POA: Diagnosis not present

## 2021-03-08 LAB — CBC WITH DIFFERENTIAL/PLATELET
Abs Immature Granulocytes: 0.12 10*3/uL — ABNORMAL HIGH (ref 0.00–0.07)
Basophils Absolute: 0 10*3/uL (ref 0.0–0.1)
Basophils Relative: 1 %
Eosinophils Absolute: 0 10*3/uL (ref 0.0–0.5)
Eosinophils Relative: 1 %
HCT: 44.3 % (ref 36.0–46.0)
Hemoglobin: 14 g/dL (ref 12.0–15.0)
Immature Granulocytes: 4 %
Lymphocytes Relative: 27 %
Lymphs Abs: 0.8 10*3/uL (ref 0.7–4.0)
MCH: 29.4 pg (ref 26.0–34.0)
MCHC: 31.6 g/dL (ref 30.0–36.0)
MCV: 92.9 fL (ref 80.0–100.0)
Monocytes Absolute: 0.3 10*3/uL (ref 0.1–1.0)
Monocytes Relative: 9 %
Neutro Abs: 1.7 10*3/uL (ref 1.7–7.7)
Neutrophils Relative %: 58 %
Platelets: 229 10*3/uL (ref 150–400)
RBC: 4.77 MIL/uL (ref 3.87–5.11)
RDW: 15.3 % (ref 11.5–15.5)
WBC: 2.9 10*3/uL — ABNORMAL LOW (ref 4.0–10.5)
nRBC: 0 % (ref 0.0–0.2)

## 2021-03-09 ENCOUNTER — Other Ambulatory Visit: Payer: Medicare Other

## 2021-03-09 ENCOUNTER — Ambulatory Visit: Payer: Medicare Other

## 2021-03-10 DIAGNOSIS — L97812 Non-pressure chronic ulcer of other part of right lower leg with fat layer exposed: Secondary | ICD-10-CM | POA: Diagnosis not present

## 2021-03-11 ENCOUNTER — Other Ambulatory Visit: Payer: Self-pay | Admitting: *Deleted

## 2021-03-11 DIAGNOSIS — D708 Other neutropenia: Secondary | ICD-10-CM

## 2021-03-15 ENCOUNTER — Inpatient Hospital Stay (HOSPITAL_BASED_OUTPATIENT_CLINIC_OR_DEPARTMENT_OTHER): Payer: Medicare Other | Admitting: Internal Medicine

## 2021-03-15 ENCOUNTER — Other Ambulatory Visit: Payer: Self-pay

## 2021-03-15 ENCOUNTER — Ambulatory Visit: Payer: Medicare Other

## 2021-03-15 ENCOUNTER — Inpatient Hospital Stay: Payer: Medicare Other

## 2021-03-15 ENCOUNTER — Ambulatory Visit: Payer: Medicare Other | Admitting: Internal Medicine

## 2021-03-15 ENCOUNTER — Other Ambulatory Visit: Payer: Medicare Other

## 2021-03-15 VITALS — BP 159/59 | HR 84 | Temp 98.0°F | Resp 18 | Wt 170.0 lb

## 2021-03-15 DIAGNOSIS — Z885 Allergy status to narcotic agent status: Secondary | ICD-10-CM | POA: Diagnosis not present

## 2021-03-15 DIAGNOSIS — M316 Other giant cell arteritis: Secondary | ICD-10-CM

## 2021-03-15 DIAGNOSIS — D72819 Decreased white blood cell count, unspecified: Secondary | ICD-10-CM | POA: Diagnosis not present

## 2021-03-15 DIAGNOSIS — Z8616 Personal history of COVID-19: Secondary | ICD-10-CM | POA: Diagnosis not present

## 2021-03-15 DIAGNOSIS — T380X5A Adverse effect of glucocorticoids and synthetic analogues, initial encounter: Secondary | ICD-10-CM | POA: Diagnosis not present

## 2021-03-15 DIAGNOSIS — R233 Spontaneous ecchymoses: Secondary | ICD-10-CM

## 2021-03-15 DIAGNOSIS — Z85828 Personal history of other malignant neoplasm of skin: Secondary | ICD-10-CM | POA: Diagnosis not present

## 2021-03-15 DIAGNOSIS — R030 Elevated blood-pressure reading, without diagnosis of hypertension: Secondary | ICD-10-CM

## 2021-03-15 DIAGNOSIS — D692 Other nonthrombocytopenic purpura: Secondary | ICD-10-CM | POA: Diagnosis not present

## 2021-03-15 DIAGNOSIS — Z23 Encounter for immunization: Secondary | ICD-10-CM

## 2021-03-15 DIAGNOSIS — R202 Paresthesia of skin: Secondary | ICD-10-CM | POA: Diagnosis not present

## 2021-03-15 DIAGNOSIS — D708 Other neutropenia: Secondary | ICD-10-CM

## 2021-03-15 DIAGNOSIS — Z7952 Long term (current) use of systemic steroids: Secondary | ICD-10-CM

## 2021-03-15 DIAGNOSIS — Z87442 Personal history of urinary calculi: Secondary | ICD-10-CM | POA: Diagnosis not present

## 2021-03-15 DIAGNOSIS — Z9049 Acquired absence of other specified parts of digestive tract: Secondary | ICD-10-CM | POA: Diagnosis not present

## 2021-03-15 DIAGNOSIS — Z9013 Acquired absence of bilateral breasts and nipples: Secondary | ICD-10-CM | POA: Diagnosis not present

## 2021-03-15 DIAGNOSIS — Z8719 Personal history of other diseases of the digestive system: Secondary | ICD-10-CM | POA: Diagnosis not present

## 2021-03-15 DIAGNOSIS — Z79899 Other long term (current) drug therapy: Secondary | ICD-10-CM | POA: Diagnosis not present

## 2021-03-15 LAB — BASIC METABOLIC PANEL
Anion gap: 9 (ref 5–15)
BUN: 20 mg/dL (ref 8–23)
CO2: 29 mmol/L (ref 22–32)
Calcium: 8.9 mg/dL (ref 8.9–10.3)
Chloride: 100 mmol/L (ref 98–111)
Creatinine, Ser: 1.06 mg/dL — ABNORMAL HIGH (ref 0.44–1.00)
GFR, Estimated: 52 mL/min — ABNORMAL LOW (ref >60)
Glucose, Bld: 101 mg/dL — ABNORMAL HIGH (ref 70–99)
Potassium: 4.3 mmol/L (ref 3.5–5.1)
Sodium: 138 mmol/L (ref 135–145)

## 2021-03-15 LAB — CBC WITH DIFFERENTIAL/PLATELET
Abs Immature Granulocytes: 0 10*3/uL (ref 0.00–0.07)
Basophils Absolute: 0 10*3/uL (ref 0.0–0.1)
Basophils Relative: 1 %
Eosinophils Absolute: 0 10*3/uL (ref 0.0–0.5)
Eosinophils Relative: 1 %
HCT: 44.8 % (ref 36.0–46.0)
Hemoglobin: 14.2 g/dL (ref 12.0–15.0)
Immature Granulocytes: 0 %
Lymphocytes Relative: 52 %
Lymphs Abs: 1.3 10*3/uL (ref 0.7–4.0)
MCH: 29 pg (ref 26.0–34.0)
MCHC: 31.7 g/dL (ref 30.0–36.0)
MCV: 91.6 fL (ref 80.0–100.0)
Monocytes Absolute: 0.3 10*3/uL (ref 0.1–1.0)
Monocytes Relative: 12 %
Neutro Abs: 0.8 10*3/uL — ABNORMAL LOW (ref 1.7–7.7)
Neutrophils Relative %: 34 %
Platelets: 210 10*3/uL (ref 150–400)
RBC: 4.89 MIL/uL (ref 3.87–5.11)
RDW: 15.2 % (ref 11.5–15.5)
Smear Review: NORMAL
WBC: 2.5 10*3/uL — ABNORMAL LOW (ref 4.0–10.5)
nRBC: 0 % (ref 0.0–0.2)

## 2021-03-15 MED ORDER — FILGRASTIM-SNDZ 480 MCG/0.8ML IJ SOSY
480.0000 ug | PREFILLED_SYRINGE | Freq: Once | INTRAMUSCULAR | Status: AC
  Administered 2021-03-15: 480 ug via SUBCUTANEOUS
  Filled 2021-03-15: qty 0.8

## 2021-03-15 MED ORDER — INFLUENZA VAC A&B SA ADJ QUAD 0.5 ML IM PRSY
0.5000 mL | PREFILLED_SYRINGE | Freq: Once | INTRAMUSCULAR | Status: AC
  Administered 2021-03-15: 0.5 mL via INTRAMUSCULAR

## 2021-03-15 NOTE — Progress Notes (Signed)
Patient states that she had a "skin cancer" removed from her right leg, where they had to "go down to the bone" to remove it, then it got infected so she is having a lot pain and swelling in her leg, ankle, and foot.

## 2021-03-15 NOTE — Assessment & Plan Note (Addendum)
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  Patient clinically asymptomatic.  Continue cbc/zarxioweekly/pt pref.STABLE; continue prednisone 5 mg/day [re- fill]; continue at current dose.   # Right LE infection s/p excision re: Skin cancer [Dr.Dasher]-   # COVID [sore throat] s/p paxlovid-symptoms resolved.  Discussed re: COVID EVUSHELD PROPHYLAXIS: Given the patient's immunodeficiency I would recommend EVUSHELD prophylaxis.  S/p counseling; declined.   # Elevated BP [at home ~130]- STABLE.   # Hx of temporal arteritis->20 years on low dose prednisone-STABLE.   # Easy burising Bil LE/Upper extremity: sec to prednisone/senile purpura. STABLE.   # DISPOSITION: # proceed with ZARXIO today # weekly cbc/zaxio x 8 # follow up with MD in 8  Weeks; - cbc/bmp-zarxio-Dr.B

## 2021-03-15 NOTE — Progress Notes (Signed)
[facility] NOTE  Patient Care Team: Baxter Hire, MD as PCP - General (Internal Medicine) Emmaline Kluver., MD (Rheumatology) Dasher, Rayvon Char, MD (Dermatology) Cammie Sickle, MD as Consulting Physician (Hematology and Oncology) Carloyn Manner, MD as Consulting Physician (Otolaryngology)  CHIEF COMPLAINTS/PURPOSE OF CONSULTATION: Autoimmune neutropenia  #April 2019- Autoimmune neutropenia-weekly Granix/CBC [Dr.Crocoran]; BMBx- no malignancy [Bone marrow aspirate and biopsy on 09/04/2017 revealed a hypercellular marrow for age with trilineage hematopoiesis.  There was abundant mature neutrophils.  Significant dyspoiesis or increased blasts was not identified.  Flow cytometry revealed a predominance of T lymphocytes (16% of all cells) with no abnormal phenotype.  There was a minor B-cell population (13% of lymphocytes) with slight kappa light chain excess.  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    HISTORY OF PRESENTING ILLNESS:  [name] 86 y.o.  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  In the interim patient was evaluated by dermatology for skin cancer on the right lower extremity.  Patient needed resection.  Patient on antibiotics post excision of the skin cancer.  Complains of pain from the excision.  Otherwise no fever no chills.  No hospitalizations.  No respiratory infections or shortness of breath.  Patient continues to complain of easy bruising-with minor trauma.   Review of Systems   Constitutional:  Negative for chills, diaphoresis, fever, malaise/fatigue and weight loss.  HENT:  Negative for nosebleeds and sore throat.   Eyes:  Negative for double vision.  Respiratory:  Negative for cough, hemoptysis, sputum production, shortness of breath and wheezing.   Cardiovascular:  Negative for chest pain, palpitations and orthopnea.  Gastrointestinal:  Negative for blood in stool, constipation, diarrhea, heartburn, melena, nausea and vomiting.  Genitourinary:  Negative for dysuria, frequency and urgency.  Musculoskeletal:  Negative for back pain and joint pain.  Skin:  Negative for itching.  Neurological:  Positive for tingling. Negative for dizziness, focal weakness, weakness and headaches.  Endo/Heme/Allergies:  Bruises/bleeds easily.  Psychiatric/Behavioral:  Negative for depression. The patient is nervous/anxious. The patient does not have insomnia.     MEDICAL HISTORY:  Past Medical History:  Diagnosis Date   Breast cancer ([place]) 1985   bilateral mastectomies   Depression    1 time specific situation that she did not know how to deal wiht   Diverticulosis    Hiatal hernia    History of Bell's palsy    History of colon polyps    History of COVID-19    History of nephrolithiasis    History of pancreatitis    Hyperlipidemia    IBS (irritable bowel syndrome)    Osteoporosis    osteopenia in neck   Skin cancer    Temporal arteritis ([facility])     SURGICAL HISTORY: Past Surgical History:  Procedure Laterality Date   AUGMENTATION MAMMAPLASTY Bilateral 2000   COLON RESECTION SIGMOID N/A 10/13/2017   Procedure: COLON RESECTION SIGMOID;  Surgeon: Herbert Pun, MD;  Location: [facility] ORS;  Service: General;  Laterality: N/A;   COLOSTOMY N/A 10/13/2017   Procedure: COLOSTOMY;  Surgeon: Herbert Pun, MD;  Location:  [facility] ORS;  Service: General;  Laterality: N/A;   LAPAROSCOPIC CHOLECYSTECTOMY  2009   MASTECTOMY  1983   bilateral    SOCIAL HISTORY: Social  History   Socioeconomic History   Marital status: Widowed    Spouse name: Not on file   Number of children: 2   Years of education: college   Highest education level: Not on file  Occupational History   Occupation: Engineer, production   Occupation: Retired  Tobacco Use   Smoking status: Former    Packs/day: 0.25    Years: 30.00    Pack years: 7.50    Types: Cigarettes    Quit date: 05/17/2005    Years since quitting: 15.8   Smokeless tobacco: Never   Tobacco comments:    quit 11 years ago  Vaping Use   Vaping Use: Never used  Substance and Sexual Activity   Alcohol use: No    Alcohol/week: 0.0 standard drinks   Drug use: No   Sexual activity: Not Currently  Other Topics Concern   Not on file  Social History Narrative   Patient lives at home alone.    Patient is retired.    Patient has some college.    Patient has 2 children.   Social Determinants of Health   Financial Resource Strain: Not on file  Food Insecurity: Not on file  Transportation Needs: Not on file  Physical Activity: Not on file  Stress: Not on file  Social Connections: Not on file  Intimate Partner Violence: Not on file    FAMILY HISTORY: Family History  Problem Relation Age of Onset   Colon cancer Mother 39   Scleroderma Father 16   Alcoholism Brother    Stomach cancer Neg Hx     ALLERGIES:  is allergic to morphine, benzalkonium chloride, escitalopram, hydralazine, neomycin-bacitracin zn-polymyx, and bacitracin-neomycin-polymyxin.  MEDICATIONS:  Current Outpatient Medications  Medication Sig Dispense Refill   alendronate (FOSAMAX) 70 MG tablet Take 70 mg by mouth once a week.     ALPRAZolam (XANAX) 0.25 MG tablet Take 0.25 mg by mouth 2 (two) times daily as needed.      Ascorbic Acid (VITAMIN C) 1000 MG tablet Take 1,000 mg by mouth daily.     aspirin EC 81 MG tablet Take 81 mg daily by mouth.     b complex vitamins capsule Take 1 capsule by mouth daily.     Biotin 1000 MCG tablet Take 1,000  mcg by mouth daily.      Cholecalciferol (VITAMIN D3) 2000 units capsule Take 2,000 Units by mouth daily.     filgrastim-sndz (ZARXIO) 480 MCG/0.8ML SOSY injection Inject 0.8 mLs (480 mcg total) into the skin once a week. Administer injection subcutaneously 3.2 mL 6   predniSONE (DELTASONE) 5 MG tablet Take 5 mg by mouth daily.     SSD 1 % cream Apply topically.     traMADol (ULTRAM) 50 MG tablet Take 50 mg by mouth daily.     ZINC OXIDE PO Take 1 tablet by mouth daily.     amLODipine (NORVASC) 5 MG tablet Take 5 mg by mouth daily. Pt states that she she increased the tablet to 5 mg daily. She was taking 2.5 mg. She stated that she thought the half tablet was not working efficiently. I asked the patient to inform her pcp that her dosage was increased.     Cyanocobalamin (B-12 PO) Take 2,500 mcg daily by mouth.  (Patient not taking: Reported on 03/15/2021)  doxycycline (VIBRAMYCIN) 100 MG capsule Take 100 mg by mouth 2 (two) times daily. (Patient not taking: Reported on 03/15/2021)     No current facility-administered medications for this visit.    PHYSICAL EXAMINATION: ECOG PERFORMANCE STATUS: 0 - Asymptomatic  Vitals:   03/15/21 0943  BP: (!) 159/59  Pulse: 84  Resp: 18  Temp: 98 F (36.7 C)  SpO2: 99%   Filed Weights   03/15/21 0935  Weight: 170 lb (77.1 kg)    Physical Exam Constitutional:      Comments: Alone.  HENT:     Head: Normocephalic and atraumatic.     Mouth/Throat:     Pharynx: No oropharyngeal exudate.  Eyes:     Pupils: Pupils are equal, round, and reactive to light.  Cardiovascular:     Rate and Rhythm: Normal rate and regular rhythm.  Pulmonary:     Effort: No respiratory distress.     Breath sounds: No wheezing.  Abdominal:     General: Bowel sounds are normal. There is no distension.     Palpations: Abdomen is soft. There is no mass.     Tenderness: There is no abdominal tenderness. There is no guarding or rebound.     Comments: Colostomy.   Parastomal hernia.  Musculoskeletal:        General: No tenderness. Normal range of motion.     Cervical back: Normal range of motion and neck supple.  Skin:    General: Skin is warm.     Comments: Hyperpigmented rash noted bilateral shins.  Right arm bruising/thin skin.  Neurological:     Mental Status: She is alert and oriented to person, place, and time.  Psychiatric:        Mood and Affect: Affect normal.     Comments: Anxious.     LABORATORY DATA:  I have reviewed the data as listed Lab Results  Component Value Date   WBC 2.5 (L) 03/15/2021   HGB 14.2 03/15/2021   HCT 44.8 03/15/2021   MCV 91.6 03/15/2021   PLT 210 03/15/2021   Recent Labs    11/23/20 1010 03/01/21 1105 03/15/21 0908  NA 140 139 138  K 4.2 4.3 4.3  CL 101 101 100  CO2 30 28 29   GLUCOSE 103* 106* 101*  BUN 19 22 20   CREATININE 1.03* 1.04* 1.06*  CALCIUM 9.1 9.2 8.9  GFRNONAA 54* 53* 52*    RADIOGRAPHIC STUDIES: I have personally reviewed the radiological images as listed and agreed with the findings in the report. No results found.   ASSESSMENT & PLAN:   Autoimmune neutropenia (HCC) # Autoimmune neutropenia- once a week- if ANC < 1.5- zarxio-patient's WBC- today-2.5; ANC- 0.8 Continue zarxio today; and  continue weekly zarxio.  Patient clinically asymptomatic.  Continue cbc/zarxioweekly/pt pref.STABLE; continue prednisone 5 mg/day [re- fill]; continue at current dose.   # Right LE infection s/p excision re: Skin cancer [Dr.Dasher]-   # COVID [sore throat] s/p paxlovid-symptoms resolved.  Discussed re: COVID EVUSHELD PROPHYLAXIS: Given the patient's immunodeficiency I would recommend EVUSHELD prophylaxis.  S/p counseling; declined.   # Elevated BP [at home ~130]- STABLE.   # Hx of temporal arteritis->20 years on low dose prednisone-STABLE.   # Easy burising Bil LE/Upper extremity: sec to prednisone/senile purpura. STABLE.   # DISPOSITION: # proceed with ZARXIO today # weekly  cbc/zaxio x 8 # follow up with MD in 8  Weeks; - cbc/bmp-zarxio-Dr.B  All questions were answered. The patient knows to call the clinic  with any problems, questions or concerns.    Cammie Sickle, MD 03/15/2021 11:09 AM

## 2021-03-22 ENCOUNTER — Other Ambulatory Visit: Payer: Self-pay

## 2021-03-22 ENCOUNTER — Inpatient Hospital Stay: Payer: Medicare Other

## 2021-03-22 DIAGNOSIS — D708 Other neutropenia: Secondary | ICD-10-CM | POA: Diagnosis not present

## 2021-03-22 DIAGNOSIS — R202 Paresthesia of skin: Secondary | ICD-10-CM | POA: Diagnosis not present

## 2021-03-22 DIAGNOSIS — Z79899 Other long term (current) drug therapy: Secondary | ICD-10-CM | POA: Diagnosis not present

## 2021-03-22 DIAGNOSIS — Z23 Encounter for immunization: Secondary | ICD-10-CM | POA: Diagnosis not present

## 2021-03-22 DIAGNOSIS — Z9049 Acquired absence of other specified parts of digestive tract: Secondary | ICD-10-CM | POA: Diagnosis not present

## 2021-03-22 DIAGNOSIS — Z85828 Personal history of other malignant neoplasm of skin: Secondary | ICD-10-CM | POA: Diagnosis not present

## 2021-03-22 DIAGNOSIS — Z8719 Personal history of other diseases of the digestive system: Secondary | ICD-10-CM | POA: Diagnosis not present

## 2021-03-22 DIAGNOSIS — T380X5A Adverse effect of glucocorticoids and synthetic analogues, initial encounter: Secondary | ICD-10-CM | POA: Diagnosis not present

## 2021-03-22 DIAGNOSIS — Z8616 Personal history of COVID-19: Secondary | ICD-10-CM | POA: Diagnosis not present

## 2021-03-22 DIAGNOSIS — Z885 Allergy status to narcotic agent status: Secondary | ICD-10-CM | POA: Diagnosis not present

## 2021-03-22 DIAGNOSIS — M316 Other giant cell arteritis: Secondary | ICD-10-CM | POA: Diagnosis not present

## 2021-03-22 DIAGNOSIS — D692 Other nonthrombocytopenic purpura: Secondary | ICD-10-CM | POA: Diagnosis not present

## 2021-03-22 DIAGNOSIS — Z9013 Acquired absence of bilateral breasts and nipples: Secondary | ICD-10-CM | POA: Diagnosis not present

## 2021-03-22 DIAGNOSIS — Z87442 Personal history of urinary calculi: Secondary | ICD-10-CM | POA: Diagnosis not present

## 2021-03-22 DIAGNOSIS — D72819 Decreased white blood cell count, unspecified: Secondary | ICD-10-CM

## 2021-03-22 LAB — CBC WITH DIFFERENTIAL/PLATELET
Abs Immature Granulocytes: 0.03 10*3/uL (ref 0.00–0.07)
Basophils Absolute: 0 10*3/uL (ref 0.0–0.1)
Basophils Relative: 0 %
Eosinophils Absolute: 0 10*3/uL (ref 0.0–0.5)
Eosinophils Relative: 2 %
HCT: 45.4 % (ref 36.0–46.0)
Hemoglobin: 14.3 g/dL (ref 12.0–15.0)
Immature Granulocytes: 1 %
Lymphocytes Relative: 61 %
Lymphs Abs: 1.6 10*3/uL (ref 0.7–4.0)
MCH: 29.1 pg (ref 26.0–34.0)
MCHC: 31.5 g/dL (ref 30.0–36.0)
MCV: 92.3 fL (ref 80.0–100.0)
Monocytes Absolute: 0.2 10*3/uL (ref 0.1–1.0)
Monocytes Relative: 7 %
Neutro Abs: 0.8 10*3/uL — ABNORMAL LOW (ref 1.7–7.7)
Neutrophils Relative %: 29 %
Platelets: 180 10*3/uL (ref 150–400)
RBC: 4.92 MIL/uL (ref 3.87–5.11)
RDW: 15.2 % (ref 11.5–15.5)
Smear Review: NORMAL
WBC: 2.6 10*3/uL — ABNORMAL LOW (ref 4.0–10.5)
nRBC: 0 % (ref 0.0–0.2)

## 2021-03-22 MED ORDER — FILGRASTIM-SNDZ 480 MCG/0.8ML IJ SOSY
480.0000 ug | PREFILLED_SYRINGE | Freq: Once | INTRAMUSCULAR | Status: AC
  Administered 2021-03-22: 480 ug via SUBCUTANEOUS
  Filled 2021-03-22: qty 0.8

## 2021-03-29 ENCOUNTER — Inpatient Hospital Stay: Payer: Medicare Other | Attending: Internal Medicine

## 2021-03-29 ENCOUNTER — Inpatient Hospital Stay: Payer: Medicare Other

## 2021-03-29 ENCOUNTER — Other Ambulatory Visit: Payer: Self-pay

## 2021-03-29 DIAGNOSIS — D72819 Decreased white blood cell count, unspecified: Secondary | ICD-10-CM

## 2021-03-29 DIAGNOSIS — D708 Other neutropenia: Secondary | ICD-10-CM

## 2021-03-29 DIAGNOSIS — M316 Other giant cell arteritis: Secondary | ICD-10-CM | POA: Diagnosis not present

## 2021-03-29 DIAGNOSIS — Z79899 Other long term (current) drug therapy: Secondary | ICD-10-CM | POA: Diagnosis not present

## 2021-03-29 LAB — CBC WITH DIFFERENTIAL/PLATELET
Abs Immature Granulocytes: 0.06 10*3/uL (ref 0.00–0.07)
Basophils Absolute: 0 10*3/uL (ref 0.0–0.1)
Basophils Relative: 1 %
Eosinophils Absolute: 0 10*3/uL (ref 0.0–0.5)
Eosinophils Relative: 1 %
HCT: 45.9 % (ref 36.0–46.0)
Hemoglobin: 14.8 g/dL (ref 12.0–15.0)
Immature Granulocytes: 2 %
Lymphocytes Relative: 45 %
Lymphs Abs: 1.4 10*3/uL (ref 0.7–4.0)
MCH: 29.4 pg (ref 26.0–34.0)
MCHC: 32.2 g/dL (ref 30.0–36.0)
MCV: 91.3 fL (ref 80.0–100.0)
Monocytes Absolute: 0.3 10*3/uL (ref 0.1–1.0)
Monocytes Relative: 11 %
Neutro Abs: 1.2 10*3/uL — ABNORMAL LOW (ref 1.7–7.7)
Neutrophils Relative %: 40 %
Platelets: 192 10*3/uL (ref 150–400)
RBC: 5.03 MIL/uL (ref 3.87–5.11)
RDW: 15.4 % (ref 11.5–15.5)
Smear Review: NORMAL
WBC: 3.1 10*3/uL — ABNORMAL LOW (ref 4.0–10.5)
nRBC: 0 % (ref 0.0–0.2)

## 2021-03-29 MED ORDER — FILGRASTIM-SNDZ 480 MCG/0.8ML IJ SOSY
480.0000 ug | PREFILLED_SYRINGE | Freq: Once | INTRAMUSCULAR | Status: AC
  Administered 2021-03-29: 480 ug via SUBCUTANEOUS
  Filled 2021-03-29: qty 0.8

## 2021-03-30 ENCOUNTER — Ambulatory Visit: Payer: Medicare Other | Admitting: Gastroenterology

## 2021-03-30 ENCOUNTER — Encounter: Payer: Self-pay | Admitting: Gastroenterology

## 2021-03-30 VITALS — BP 181/69 | HR 106 | Temp 97.9°F | Ht 65.0 in | Wt 170.0 lb

## 2021-03-30 DIAGNOSIS — Z87898 Personal history of other specified conditions: Secondary | ICD-10-CM

## 2021-03-30 NOTE — Progress Notes (Signed)
Jonathon Bellows MD, MRCP(U.K) [address]  [place]  [place], [place] [address]  Main: [phone]  Fax: [phone]   Gastroenterology Consultation  Referring Provider:     Baxter Hire, MD Primary Care Physician:  Baxter Hire, MD Primary Gastroenterologist:  Dr. Jonathon Bellows  Reason for Consultation:     GI issues        HPI:   [name] is a 86 y.o. y/o female referred for consultation & management  by Dr. Edwina Barth, Chrystie Nose, MD. history of autoimmune neutropenia follows with Dr. Rogue Bussing.  Referred for diverticulitis of the colon.  09/09/2020 CT abdomen pelvis demonstrated small sliding type hiatal hernia.  When she called for this appointment she was having some abdominal discomfort in the left lower quadrant.  States that about 3 years back she had multiple abscesses in the abdomen after an episode of abdominal discomfort and she was found to have autoimmune neutropenia and underwent surgery with part of the colon resected.  Since then she gets concerned with any GI issues.  From the time she had these issues and she waited for the appointment to see me all GI issues have resolved.  Does not have any diarrhea, rectal bleeding, abdominal discomfort.  She has wanted to come and discuss with me if anything more needs to be done.  06/11/2012 colonoscopy: Demonstrated 2 sessile polyps ranging from 3 to 5 mm in size resected in the rectum.  Mild to moderate sigmoid diverticulosis.Polyps are hyperplastic  Past Medical History:  Diagnosis Date   Breast cancer ([place]) 1985   bilateral mastectomies   Depression    1 time specific situation that she did not know how to deal wiht   Diverticulosis    Hiatal hernia    History of Bell's palsy    History of colon polyps    History of COVID-19    History of nephrolithiasis    History of pancreatitis    Hyperlipidemia    IBS (irritable bowel syndrome)    Osteoporosis    osteopenia in neck   Skin cancer    Temporal  arteritis ([place])     Past Surgical History:  Procedure Laterality Date   AUGMENTATION MAMMAPLASTY Bilateral 2000   COLON RESECTION SIGMOID N/A 10/13/2017   Procedure: COLON RESECTION SIGMOID;  Surgeon: Herbert Pun, MD;  Location: [facility] ORS;  Service: General;  Laterality: N/A;   COLOSTOMY N/A 10/13/2017   Procedure: COLOSTOMY;  Surgeon: Herbert Pun, MD;  Location: [facility] ORS;  Service: General;  Laterality: N/A;   LAPAROSCOPIC CHOLECYSTECTOMY  2009   MASTECTOMY  1983   bilateral    Prior to Admission medications   Medication Sig Start Date End Date Taking? Authorizing Provider  alendronate (FOSAMAX) 70 MG tablet Take 70 mg by mouth once a week. 06/08/20   [provider]  ALPRAZolam Duanne Moron) 0.25 MG tablet Take 0.25 mg by mouth 2 (two) times daily as needed.  11/03/10   [provider]  amLODipine (NORVASC) 5 MG tablet Take 5 mg by mouth daily. Pt states that she she increased the tablet to 5 mg daily. She was taking 2.5 mg. She stated that she thought the half tablet was not working efficiently. I asked the patient to inform her pcp that her dosage was increased. 09/25/19 09/24/20  [provider]  Ascorbic Acid (VITAMIN C) 1000 MG tablet Take 1,000 mg by mouth daily.    [provider]  aspirin EC 81 MG tablet Take  81 mg daily by mouth.    [provider]  b complex vitamins capsule Take 1 capsule by mouth daily.    [provider]  Biotin 1000 MCG tablet Take 1,000 mcg by mouth daily.     [provider]  Cholecalciferol (VITAMIN D3) 2000 units capsule Take 2,000 Units by mouth daily.    [provider]  Cyanocobalamin (B-12 PO) Take 2,500 mcg daily by mouth.  Patient not taking: Reported on 03/15/2021    [provider]  doxycycline (VIBRAMYCIN) 100 MG capsule Take 100 mg by mouth 2 (two) times daily. Patient not taking: Reported on 03/15/2021 02/22/21   [provider]  filgrastim-sndz  (ZARXIO) 480 MCG/0.8ML SOSY injection Inject 0.8 mLs (480 mcg total) into the skin once a week. Administer injection subcutaneously 06/25/19   Cammie Sickle, MD  predniSONE (DELTASONE) 5 MG tablet Take 5 mg by mouth daily.    [provider]  SSD 1 % cream Apply topically. 03/11/21   [provider]  traMADol (ULTRAM) 50 MG tablet Take 50 mg by mouth daily. 11/21/10   [provider]  ZINC OXIDE PO Take 1 tablet by mouth daily.    [provider]    Family History  Problem Relation Age of Onset   Colon cancer Mother 85   Scleroderma Father 66   Alcoholism Brother    Stomach cancer Neg Hx      Social History   Tobacco Use   Smoking status: Former    Packs/day: 0.25    Years: 30.00    Pack years: 7.50    Types: Cigarettes    Quit date: 05/17/2005    Years since quitting: 15.8   Smokeless tobacco: Never   Tobacco comments:    quit 11 years ago  Vaping Use   Vaping Use: Never used  Substance Use Topics   Alcohol use: No    Alcohol/week: 0.0 standard drinks   Drug use: No    Allergies as of 03/30/2021 - Review Complete 03/30/2021  Allergen Reaction Noted   Morphine Other (See Comments) 07/12/2018   Benzalkonium chloride Dermatitis 04/14/2014   Escitalopram  10/20/2014   Hydralazine  07/25/2018   Neomycin-bacitracin zn-polymyx Dermatitis 06/08/2008   Bacitracin-neomycin-polymyxin Dermatitis 06/08/2008    Review of Systems:    All systems reviewed and negative except where noted in HPI.   Physical Exam:  BP (!) 181/69   Pulse (!) 106   Temp 97.9 F (36.6 C) (Oral)   Ht 5\' 5"  (1.651 m)   Wt 170 lb (77.1 kg)   BMI 28.29 kg/m  No LMP recorded. Patient is postmenopausal. Psych:  Alert and cooperative. Normal mood and affect. General:   Alert,  Well-developed, well-nourished, pleasant and cooperative in NAD Head:  Normocephalic and atraumatic. Eyes:  Sclera clear, no icterus.   Conjunctiva pink. Ears:  Normal auditory  acuity. Abdomen:  Normal bowel sounds.  No bruits.  Soft, non-tender and non-distended without masses, hepatosplenomegaly or hernias noted.  No guarding or rebound tenderness.    Neurologic:  Alert and oriented x3;  grossly normal neurologically. Psych:  Alert and cooperative. Normal mood and affect.  Imaging Studies: No results found.  Assessment and Plan:   [name] is a 86 y.o. y/o female with a history of autoimmune neutropenia had some abdominal pain over a month back and has had prior abdominal surgery for abscesses requiring part of her colon to be taken out.  During this episode she  was diagnosed with the autoimmune neutropenia.  Since then she has always been alert for any GI symptoms obviously due to concerns based on past history.  At present all her GI symptoms have resolved.  CT abdomen and pelvis done at that point of time showed no gross abnormality.  I explained that it could have been a stomach bug which had resolved.  With her autoimmune neutropenia she is more susceptible to these kind of illnesses.  She has done the right thing by getting evaluated and I explained to her that if she were to have further episodes to call me right away and we can discuss if further evaluation is needed at that point of time.  I do not see a reason to perform any endoscopy procedures at this point of time.  She is not due for a colonoscopy at least for 2 years if at all.  I discussed that after the age of 61 we do not perform colonoscopy for colon cancer screening usually due to the increased risk was benefit ratio.  Obviously we changed this to rule on a case to case basis after discussion if needed.  I provided her with a list of foods which are high in FODMAPs as this can cause bloating abdominal discomfort at times and she can refer to that if she were to have brief episodes of GI symptoms  Follow up in as needed  Dr Jonathon Bellows MD,MRCP(U.K)

## 2021-03-30 NOTE — Patient Instructions (Signed)
Low-FODMAP Eating Plan °FODMAP stands for fermentable oligosaccharides, disaccharides, monosaccharides, and polyols. These are sugars that are hard for some people to digest. A low-FODMAP eating plan may help some people who have irritable bowel syndrome (IBS) and certain other bowel (intestinal) diseases to manage their symptoms. °This meal plan can be complicated to follow. Work with a diet and nutrition specialist (dietitian) to make a low-FODMAP eating plan that is right for you. A dietitian can help make sure that you get enough nutrition from this diet. °What are tips for following this plan? °Reading food labels °Check labels for hidden FODMAPs such as: °High-fructose syrup. °Honey. °Agave. °Natural fruit flavors. °Onion or garlic powder. °Choose low-FODMAP foods that contain 3-4 grams of fiber per serving. °Check food labels for serving sizes. Eat only one serving at a time to make sure FODMAP levels stay low. °Shopping °Shop with a list of foods that are recommended on this diet and make a meal plan. °Meal planning °Follow a low-FODMAP eating plan for up to 6 weeks, or as told by your health care provider or dietitian. °To follow the eating plan: °Eliminate high-FODMAP foods from your diet completely. Choose only low-FODMAP foods to eat. You will do this for 2-6 weeks. °Gradually reintroduce high-FODMAP foods into your diet one at a time. Most people should wait a few days before introducing the next new high-FODMAP food into their meal plan. Your dietitian can recommend how quickly you may reintroduce foods. °Keep a daily record of what and how much you eat and drink. Make note of any symptoms that you have after eating. °Review your daily record with a dietitian regularly to identify which foods you can eat and which foods you should avoid. °General tips °Drink enough fluid each day to keep your urine pale yellow. °Avoid processed foods. These often have added sugar and may be high in FODMAPs. °Avoid most  dairy products, whole grains, and sweeteners. °Work with a dietitian to make sure you get enough fiber in your diet. °Avoid high FODMAP foods at meals to manage symptoms. °Recommended foods °Fruits °Bananas, oranges, tangerines, lemons, limes, blueberries, raspberries, strawberries, grapes, cantaloupe, honeydew melon, kiwi, papaya, passion fruit, and pineapple. Limited amounts of dried cranberries, banana chips, and shredded coconut. °Vegetables °Eggplant, zucchini, cucumber, peppers, green beans, bean sprouts, lettuce, arugula, kale, Swiss chard, spinach, collard greens, bok choy, summer squash, potato, and tomato. Limited amounts of corn, carrot, and sweet potato. Green parts of scallions. °Grains °Gluten-free grains, such as rice, oats, buckwheat, quinoa, corn, polenta, and millet. Gluten-free pasta, bread, or cereal. Rice noodles. Corn tortillas. °Meats and other proteins °Unseasoned beef, pork, poultry, or fish. Eggs. Bacon. Tofu (firm) and tempeh. Limited amounts of nuts and seeds, such as almonds, walnuts, brazil nuts, pecans, peanuts, nut butters, pumpkin seeds, chia seeds, and sunflower seeds. °Dairy °Lactose-free milk, yogurt, and kefir. Lactose-free cottage cheese and ice cream. Non-dairy milks, such as almond, coconut, hemp, and rice milk. Non-dairy yogurt. Limited amounts of goat cheese, brie, mozzarella, parmesan, swiss, and other hard cheeses. °Fats and oils °Butter-free spreads. Vegetable oils, such as olive, canola, and sunflower oil. °Seasoning and other foods °Artificial sweeteners with names that do not end in "ol," such as aspartame, saccharine, and stevia. Maple syrup, white table sugar, raw sugar, brown sugar, and molasses. Mayonnaise, soy sauce, and tamari. Fresh basil, coriander, parsley, rosemary, and thyme. °Beverages °Water and mineral water. Sugar-sweetened soft drinks. Small amounts of orange juice or cranberry juice. Black and green tea. Most dry wines. Coffee. °  The items listed above  may not be a complete list of foods and beverages you can eat. Contact a dietitian for more information. °Foods to avoid °Fruits °Fresh, dried, and juiced forms of apple, pear, watermelon, peach, plum, cherries, apricots, blackberries, boysenberries, figs, nectarines, and mango. Avocado. °Vegetables °Chicory root, artichoke, asparagus, cabbage, snow peas, Brussels sprouts, broccoli, sugar snap peas, mushrooms, celery, and cauliflower. Onions, garlic, leeks, and the white part of scallions. °Grains °Wheat, including kamut, durum, and semolina. Barley and bulgur. Couscous. Wheat-based cereals. Wheat noodles, bread, crackers, and pastries. °Meats and other proteins °Fried or fatty meat. Sausage. Cashews and pistachios. Soybeans, baked beans, black beans, chickpeas, kidney beans, fava beans, navy beans, lentils, black-eyed peas, and split peas. °Dairy °Milk, yogurt, ice cream, and soft cheese. Cream and sour cream. Milk-based sauces. Custard. Buttermilk. Soy milk. °Seasoning and other foods °Any sugar-free gum or candy. Foods that contain artificial sweeteners such as sorbitol, mannitol, isomalt, or xylitol. Foods that contain honey, high-fructose corn syrup, or agave. Bouillon, vegetable stock, beef stock, and chicken stock. Garlic and onion powder. Condiments made with onion, such as hummus, chutney, pickles, relish, salad dressing, and salsa. Tomato paste. °Beverages °Chicory-based drinks. Coffee substitutes. Chamomile tea. Fennel tea. Sweet or fortified wines such as port or sherry. Diet soft drinks made with isomalt, mannitol, maltitol, sorbitol, or xylitol. Apple, pear, and mango juice. Juices with high-fructose corn syrup. °The items listed above may not be a complete list of foods and beverages you should avoid. Contact a dietitian for more information. °Summary °FODMAP stands for fermentable oligosaccharides, disaccharides, monosaccharides, and polyols. These are sugars that are hard for some people to  digest. °A low-FODMAP eating plan is a short-term diet that helps to ease symptoms of certain bowel diseases. °The eating plan usually lasts up to 6 weeks. After that, high-FODMAP foods are reintroduced gradually and one at a time. This can help you find out which foods may be causing symptoms. °A low-FODMAP eating plan can be complicated. It is best to work with a dietitian who has experience with this type of plan. °This information is not intended to replace advice given to you by your health care provider. Make sure you discuss any questions you have with your health care provider. °Document Revised: 10/02/2019 Document Reviewed: 10/02/2019 °Elsevier Patient Education © 2022 [employer] Inc[employer] ° °

## 2021-04-05 ENCOUNTER — Other Ambulatory Visit: Payer: Self-pay

## 2021-04-05 ENCOUNTER — Inpatient Hospital Stay: Payer: Medicare Other

## 2021-04-05 DIAGNOSIS — D708 Other neutropenia: Secondary | ICD-10-CM

## 2021-04-05 DIAGNOSIS — Z79899 Other long term (current) drug therapy: Secondary | ICD-10-CM | POA: Diagnosis not present

## 2021-04-05 DIAGNOSIS — D72819 Decreased white blood cell count, unspecified: Secondary | ICD-10-CM

## 2021-04-05 DIAGNOSIS — M316 Other giant cell arteritis: Secondary | ICD-10-CM | POA: Diagnosis not present

## 2021-04-05 LAB — CBC WITH DIFFERENTIAL/PLATELET
Abs Immature Granulocytes: 0.03 10*3/uL (ref 0.00–0.07)
Basophils Absolute: 0 10*3/uL (ref 0.0–0.1)
Basophils Relative: 0 %
Eosinophils Absolute: 0 10*3/uL (ref 0.0–0.5)
Eosinophils Relative: 1 %
HCT: 45 % (ref 36.0–46.0)
Hemoglobin: 14.3 g/dL (ref 12.0–15.0)
Immature Granulocytes: 1 %
Lymphocytes Relative: 40 %
Lymphs Abs: 1 10*3/uL (ref 0.7–4.0)
MCH: 29.2 pg (ref 26.0–34.0)
MCHC: 31.8 g/dL (ref 30.0–36.0)
MCV: 91.8 fL (ref 80.0–100.0)
Monocytes Absolute: 0.3 10*3/uL (ref 0.1–1.0)
Monocytes Relative: 10 %
Neutro Abs: 1.2 10*3/uL — ABNORMAL LOW (ref 1.7–7.7)
Neutrophils Relative %: 48 %
Platelets: 209 10*3/uL (ref 150–400)
RBC: 4.9 MIL/uL (ref 3.87–5.11)
RDW: 15.3 % (ref 11.5–15.5)
Smear Review: NORMAL
WBC: 2.5 10*3/uL — ABNORMAL LOW (ref 4.0–10.5)
nRBC: 0 % (ref 0.0–0.2)

## 2021-04-05 MED ORDER — FILGRASTIM-SNDZ 480 MCG/0.8ML IJ SOSY
480.0000 ug | PREFILLED_SYRINGE | Freq: Once | INTRAMUSCULAR | Status: AC
  Administered 2021-04-05: 480 ug via SUBCUTANEOUS
  Filled 2021-04-05: qty 0.8

## 2021-04-12 ENCOUNTER — Other Ambulatory Visit: Payer: Self-pay

## 2021-04-12 ENCOUNTER — Inpatient Hospital Stay: Payer: Medicare Other

## 2021-04-12 DIAGNOSIS — D72819 Decreased white blood cell count, unspecified: Secondary | ICD-10-CM

## 2021-04-12 DIAGNOSIS — D708 Other neutropenia: Secondary | ICD-10-CM

## 2021-04-12 DIAGNOSIS — M316 Other giant cell arteritis: Secondary | ICD-10-CM | POA: Diagnosis not present

## 2021-04-12 DIAGNOSIS — Z79899 Other long term (current) drug therapy: Secondary | ICD-10-CM | POA: Diagnosis not present

## 2021-04-12 LAB — CBC WITH DIFFERENTIAL/PLATELET
Abs Immature Granulocytes: 0.02 10*3/uL (ref 0.00–0.07)
Basophils Absolute: 0 10*3/uL (ref 0.0–0.1)
Basophils Relative: 1 %
Eosinophils Absolute: 0.1 10*3/uL (ref 0.0–0.5)
Eosinophils Relative: 2 %
HCT: 45 % (ref 36.0–46.0)
Hemoglobin: 14.4 g/dL (ref 12.0–15.0)
Immature Granulocytes: 1 %
Lymphocytes Relative: 56 %
Lymphs Abs: 1.4 10*3/uL (ref 0.7–4.0)
MCH: 29.5 pg (ref 26.0–34.0)
MCHC: 32 g/dL (ref 30.0–36.0)
MCV: 92.2 fL (ref 80.0–100.0)
Monocytes Absolute: 0.3 10*3/uL (ref 0.1–1.0)
Monocytes Relative: 10 %
Neutro Abs: 0.7 10*3/uL — ABNORMAL LOW (ref 1.7–7.7)
Neutrophils Relative %: 30 %
Platelets: 227 10*3/uL (ref 150–400)
RBC: 4.88 MIL/uL (ref 3.87–5.11)
RDW: 15.6 % — ABNORMAL HIGH (ref 11.5–15.5)
Smear Review: ADEQUATE
WBC Morphology: ABNORMAL
WBC: 2.4 10*3/uL — ABNORMAL LOW (ref 4.0–10.5)
nRBC: 0 % (ref 0.0–0.2)

## 2021-04-12 MED ORDER — FILGRASTIM-SNDZ 480 MCG/0.8ML IJ SOSY
480.0000 ug | PREFILLED_SYRINGE | Freq: Once | INTRAMUSCULAR | Status: AC
  Administered 2021-04-12: 480 ug via SUBCUTANEOUS
  Filled 2021-04-12: qty 0.8

## 2021-04-19 ENCOUNTER — Inpatient Hospital Stay: Payer: Medicare Other

## 2021-04-19 ENCOUNTER — Other Ambulatory Visit: Payer: Self-pay

## 2021-04-19 DIAGNOSIS — D708 Other neutropenia: Secondary | ICD-10-CM

## 2021-04-19 DIAGNOSIS — Z79899 Other long term (current) drug therapy: Secondary | ICD-10-CM | POA: Diagnosis not present

## 2021-04-19 DIAGNOSIS — D72819 Decreased white blood cell count, unspecified: Secondary | ICD-10-CM | POA: Diagnosis not present

## 2021-04-19 DIAGNOSIS — M316 Other giant cell arteritis: Secondary | ICD-10-CM | POA: Diagnosis not present

## 2021-04-19 LAB — CBC WITH DIFFERENTIAL/PLATELET
Abs Immature Granulocytes: 0.12 10*3/uL — ABNORMAL HIGH (ref 0.00–0.07)
Basophils Absolute: 0 10*3/uL (ref 0.0–0.1)
Basophils Relative: 1 %
Eosinophils Absolute: 0.1 10*3/uL (ref 0.0–0.5)
Eosinophils Relative: 3 %
HCT: 45.8 % (ref 36.0–46.0)
Hemoglobin: 14.5 g/dL (ref 12.0–15.0)
Immature Granulocytes: 5 %
Lymphocytes Relative: 57 %
Lymphs Abs: 1.3 10*3/uL (ref 0.7–4.0)
MCH: 29.2 pg (ref 26.0–34.0)
MCHC: 31.7 g/dL (ref 30.0–36.0)
MCV: 92.2 fL (ref 80.0–100.0)
Monocytes Absolute: 0.3 10*3/uL (ref 0.1–1.0)
Monocytes Relative: 13 %
Neutro Abs: 0.5 10*3/uL — ABNORMAL LOW (ref 1.7–7.7)
Neutrophils Relative %: 21 %
Platelets: 198 10*3/uL (ref 150–400)
RBC: 4.97 MIL/uL (ref 3.87–5.11)
RDW: 15.6 % — ABNORMAL HIGH (ref 11.5–15.5)
Smear Review: ADEQUATE
WBC: 2.2 10*3/uL — ABNORMAL LOW (ref 4.0–10.5)
nRBC: 0 % (ref 0.0–0.2)

## 2021-04-19 MED ORDER — FILGRASTIM-SNDZ 480 MCG/0.8ML IJ SOSY
480.0000 ug | PREFILLED_SYRINGE | Freq: Once | INTRAMUSCULAR | Status: AC
  Administered 2021-04-19: 480 ug via SUBCUTANEOUS
  Filled 2021-04-19: qty 0.8

## 2021-04-26 ENCOUNTER — Inpatient Hospital Stay: Payer: Medicare Other

## 2021-04-26 ENCOUNTER — Other Ambulatory Visit: Payer: Self-pay

## 2021-04-26 DIAGNOSIS — D72819 Decreased white blood cell count, unspecified: Secondary | ICD-10-CM | POA: Diagnosis not present

## 2021-04-26 DIAGNOSIS — D708 Other neutropenia: Secondary | ICD-10-CM

## 2021-04-26 DIAGNOSIS — Z79899 Other long term (current) drug therapy: Secondary | ICD-10-CM | POA: Diagnosis not present

## 2021-04-26 DIAGNOSIS — M316 Other giant cell arteritis: Secondary | ICD-10-CM | POA: Diagnosis not present

## 2021-04-26 LAB — CBC WITH DIFFERENTIAL/PLATELET
Abs Immature Granulocytes: 0.04 10*3/uL (ref 0.00–0.07)
Basophils Absolute: 0 10*3/uL (ref 0.0–0.1)
Basophils Relative: 1 %
Eosinophils Absolute: 0 10*3/uL (ref 0.0–0.5)
Eosinophils Relative: 2 %
HCT: 45.1 % (ref 36.0–46.0)
Hemoglobin: 14.2 g/dL (ref 12.0–15.0)
Immature Granulocytes: 2 %
Lymphocytes Relative: 45 %
Lymphs Abs: 1 10*3/uL (ref 0.7–4.0)
MCH: 29.2 pg (ref 26.0–34.0)
MCHC: 31.5 g/dL (ref 30.0–36.0)
MCV: 92.8 fL (ref 80.0–100.0)
Monocytes Absolute: 0.2 10*3/uL (ref 0.1–1.0)
Monocytes Relative: 11 %
Neutro Abs: 0.9 10*3/uL — ABNORMAL LOW (ref 1.7–7.7)
Neutrophils Relative %: 39 %
Platelets: 186 10*3/uL (ref 150–400)
RBC: 4.86 MIL/uL (ref 3.87–5.11)
RDW: 15.6 % — ABNORMAL HIGH (ref 11.5–15.5)
Smear Review: NORMAL
WBC: 2.2 10*3/uL — ABNORMAL LOW (ref 4.0–10.5)
nRBC: 0 % (ref 0.0–0.2)

## 2021-04-26 MED ORDER — FILGRASTIM-SNDZ 480 MCG/0.8ML IJ SOSY
480.0000 ug | PREFILLED_SYRINGE | Freq: Once | INTRAMUSCULAR | Status: AC
  Administered 2021-04-26: 480 ug via SUBCUTANEOUS

## 2021-05-03 ENCOUNTER — Other Ambulatory Visit: Payer: Self-pay

## 2021-05-03 ENCOUNTER — Inpatient Hospital Stay: Payer: Medicare Other

## 2021-05-03 ENCOUNTER — Inpatient Hospital Stay: Payer: Medicare Other | Attending: Internal Medicine

## 2021-05-03 DIAGNOSIS — D708 Other neutropenia: Secondary | ICD-10-CM | POA: Insufficient documentation

## 2021-05-03 DIAGNOSIS — D72819 Decreased white blood cell count, unspecified: Secondary | ICD-10-CM

## 2021-05-03 LAB — CBC WITH DIFFERENTIAL/PLATELET
Abs Immature Granulocytes: 0.02 10*3/uL (ref 0.00–0.07)
Basophils Absolute: 0 10*3/uL (ref 0.0–0.1)
Basophils Relative: 1 %
Eosinophils Absolute: 0 10*3/uL (ref 0.0–0.5)
Eosinophils Relative: 2 %
HCT: 48.1 % — ABNORMAL HIGH (ref 36.0–46.0)
Hemoglobin: 15.3 g/dL — ABNORMAL HIGH (ref 12.0–15.0)
Immature Granulocytes: 1 %
Lymphocytes Relative: 53 %
Lymphs Abs: 1.3 10*3/uL (ref 0.7–4.0)
MCH: 29.4 pg (ref 26.0–34.0)
MCHC: 31.8 g/dL (ref 30.0–36.0)
MCV: 92.5 fL (ref 80.0–100.0)
Monocytes Absolute: 0.3 10*3/uL (ref 0.1–1.0)
Monocytes Relative: 13 %
Neutro Abs: 0.7 10*3/uL — ABNORMAL LOW (ref 1.7–7.7)
Neutrophils Relative %: 30 %
Platelets: 203 10*3/uL (ref 150–400)
RBC: 5.2 MIL/uL — ABNORMAL HIGH (ref 3.87–5.11)
RDW: 15.4 % (ref 11.5–15.5)
Smear Review: NORMAL
WBC: 2.5 10*3/uL — ABNORMAL LOW (ref 4.0–10.5)
nRBC: 0 % (ref 0.0–0.2)

## 2021-05-03 MED ORDER — FILGRASTIM-SNDZ 480 MCG/0.8ML IJ SOSY
480.0000 ug | PREFILLED_SYRINGE | Freq: Once | INTRAMUSCULAR | Status: AC
  Administered 2021-05-03: 480 ug via SUBCUTANEOUS
  Filled 2021-05-03: qty 0.8

## 2021-05-10 ENCOUNTER — Inpatient Hospital Stay: Payer: Medicare Other | Attending: Internal Medicine | Admitting: Oncology

## 2021-05-10 ENCOUNTER — Inpatient Hospital Stay: Payer: Medicare Other

## 2021-05-10 ENCOUNTER — Other Ambulatory Visit: Payer: Self-pay

## 2021-05-10 ENCOUNTER — Encounter: Payer: Self-pay | Admitting: Oncology

## 2021-05-10 VITALS — BP 180/52 | HR 73 | Temp 97.6°F | Resp 18 | Wt 169.0 lb

## 2021-05-10 DIAGNOSIS — D708 Other neutropenia: Secondary | ICD-10-CM

## 2021-05-10 DIAGNOSIS — M2550 Pain in unspecified joint: Secondary | ICD-10-CM | POA: Diagnosis not present

## 2021-05-10 DIAGNOSIS — Z79899 Other long term (current) drug therapy: Secondary | ICD-10-CM | POA: Insufficient documentation

## 2021-05-10 DIAGNOSIS — Z87891 Personal history of nicotine dependence: Secondary | ICD-10-CM | POA: Insufficient documentation

## 2021-05-10 DIAGNOSIS — Z811 Family history of alcohol abuse and dependence: Secondary | ICD-10-CM | POA: Diagnosis not present

## 2021-05-10 DIAGNOSIS — I10 Essential (primary) hypertension: Secondary | ICD-10-CM | POA: Insufficient documentation

## 2021-05-10 DIAGNOSIS — Z800 Family history of malignant neoplasm of digestive organs: Secondary | ICD-10-CM | POA: Insufficient documentation

## 2021-05-10 DIAGNOSIS — Z840 Family history of diseases of the skin and subcutaneous tissue: Secondary | ICD-10-CM | POA: Insufficient documentation

## 2021-05-10 DIAGNOSIS — R5383 Other fatigue: Secondary | ICD-10-CM | POA: Insufficient documentation

## 2021-05-10 DIAGNOSIS — D72819 Decreased white blood cell count, unspecified: Secondary | ICD-10-CM

## 2021-05-10 LAB — CBC WITH DIFFERENTIAL/PLATELET
Abs Immature Granulocytes: 0.04 10*3/uL (ref 0.00–0.07)
Basophils Absolute: 0 10*3/uL (ref 0.0–0.1)
Basophils Relative: 1 %
Eosinophils Absolute: 0.1 10*3/uL (ref 0.0–0.5)
Eosinophils Relative: 3 %
HCT: 46.9 % — ABNORMAL HIGH (ref 36.0–46.0)
Hemoglobin: 14.8 g/dL (ref 12.0–15.0)
Immature Granulocytes: 2 %
Lymphocytes Relative: 50 %
Lymphs Abs: 1.3 10*3/uL (ref 0.7–4.0)
MCH: 29.4 pg (ref 26.0–34.0)
MCHC: 31.6 g/dL (ref 30.0–36.0)
MCV: 93.2 fL (ref 80.0–100.0)
Monocytes Absolute: 0.3 10*3/uL (ref 0.1–1.0)
Monocytes Relative: 12 %
Neutro Abs: 0.8 10*3/uL — ABNORMAL LOW (ref 1.7–7.7)
Neutrophils Relative %: 32 %
Platelets: 225 10*3/uL (ref 150–400)
RBC: 5.03 MIL/uL (ref 3.87–5.11)
RDW: 15.6 % — ABNORMAL HIGH (ref 11.5–15.5)
Smear Review: NORMAL
WBC: 2.6 10*3/uL — ABNORMAL LOW (ref 4.0–10.5)
nRBC: 0 % (ref 0.0–0.2)

## 2021-05-10 LAB — BASIC METABOLIC PANEL
Anion gap: 10 (ref 5–15)
BUN: 15 mg/dL (ref 8–23)
CO2: 29 mmol/L (ref 22–32)
Calcium: 9.1 mg/dL (ref 8.9–10.3)
Chloride: 99 mmol/L (ref 98–111)
Creatinine, Ser: 0.99 mg/dL (ref 0.44–1.00)
GFR, Estimated: 56 mL/min — ABNORMAL LOW (ref >60)
Glucose, Bld: 94 mg/dL (ref 70–99)
Potassium: 3.6 mmol/L (ref 3.5–5.1)
Sodium: 138 mmol/L (ref 135–145)

## 2021-05-10 MED ORDER — FILGRASTIM-SNDZ 480 MCG/0.8ML IJ SOSY
480.0000 ug | PREFILLED_SYRINGE | Freq: Once | INTRAMUSCULAR | Status: AC
  Administered 2021-05-10: 480 ug via SUBCUTANEOUS
  Filled 2021-05-10: qty 0.8

## 2021-05-10 NOTE — Progress Notes (Signed)
Pt in for follow up, states she has noticed more aches after getting her injection weekly.

## 2021-05-10 NOTE — Progress Notes (Signed)
[facility] NOTE  Patient Care Team: Baxter Hire, MD as PCP - General (Internal Medicine) Emmaline Kluver., MD (Rheumatology) Dasher, Rayvon Char, MD (Dermatology) Cammie Sickle, MD as Consulting Physician (Hematology and Oncology) Carloyn Manner, MD as Consulting Physician (Otolaryngology)  CHIEF COMPLAINTS/PURPOSE OF CONSULTATION: Autoimmune neutropenia  #April 2019- Autoimmune neutropenia-weekly Granix/CBC [Dr.Crocoran]; BMBx- no malignancy [Bone marrow aspirate and biopsy on 09/04/2017 revealed a hypercellular marrow for age with trilineage hematopoiesis.  There was abundant mature neutrophils.  Significant dyspoiesis or increased blasts was not identified.  Flow cytometry revealed a predominance of T lymphocytes (16% of all cells) with no abnormal phenotype.  There was a minor B-cell population (13% of lymphocytes) with slight kappa light chain excess.  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    HISTORY OF PRESENTING ILLNESS:  [name] is an 86 year old female with history of autoimmune neutropenia on weekly growth factor support who is here for follow-up.  She was evaluated by GI Dr. Vicente Males last month due to abdominal concerns and previous history of diverticulitis of the colon and multiple abscess discovered in the colon requiring colon resection.  No additional procedures/imaging recommended at this time given symptoms have resolved on their own.  She will be due for a colonoscopy in 2 years.  Reports overall doing well although she is anxious about the upcoming  holiday.  States she has multiple family members coming to her house this weekend and the following weekend for Christmas festivities.  States she is very active and hopes to stay that way for a long time.  She has been compliant with her prednisone.  Has noticed some achiness and joint pain following the last 3 Zarxio injections.  She has been taking Claritin and Tylenol.  Denies any recent infections.  Dr. Rogue Bussing has discussed Evushield but she has declined at this time.  Review of Systems  Constitutional:  Positive for malaise/fatigue.  Musculoskeletal:  Positive for joint pain.    MEDICAL HISTORY:  Past Medical History:  Diagnosis Date   [place]ast cancer ([place]   bilateral mastectomies   Depression    1 time specific situation that she did not know how to deal wiht   Diverticulosis    Hiatal hernia    History of Bell's palsy    History of colon polyps    History of COVID-19    History of nephrolithiasis    History of pancreatitis    Hyperlipidemia    IBS (irritable bowel syndrome)    Osteoporosis    osteopenia in neck   Skin cancer    Temporal arteritis ([place])     SURGICAL HISTORY: Past Surgical History:  Procedure Laterality Date   AUGMENTATION MAMMAPLASTY Bilateral 2000   COLON RESECTION SIGMOID N/A 10/13/2017   Procedure: COLON RESECTION SIGMOID;  Surgeon: Herbert Pun, MD;  Location: [facility] ORS;  Service: General;  Laterality: N/A;   COLOSTOMY N/A 10/13/2017   Procedure: COLOSTOMY;  Surgeon: Herbert Pun, MD;  Location: [facility] ORS;  Service: General;  Laterality: N/A;   LAPAROSCOPIC CHOLECYSTECTOMY  2009   MASTECTOMY  1983   bilateral    SOCIAL HISTORY: Social  History   Socioeconomic History   Marital status: Widowed    Spouse name: Not on file   Number of children: 2   Years of education: college   Highest education level: Not on file  Occupational History   Occupation: Engineer, production   Occupation: Retired  Tobacco Use   Smoking status:  Former    Packs/day: 0.25    Years: 30.00    Pack years: 7.50    Types: Cigarettes    Quit date: 05/17/2005    Years since quitting: 15.9   Smokeless tobacco: Never   Tobacco comments:    quit 11 years ago  Vaping Use   Vaping Use: Never used  Substance and Sexual Activity   Alcohol use: No    Alcohol/week: 0.0 standard drinks   Drug use: No   Sexual activity: Not Currently  Other Topics Concern   Not on file  Social History Narrative   Patient lives at home alone.    Patient is retired.    Patient has some college.    Patient has 2 children.   Social Determinants of Health   Financial Resource Strain: Not on file  Food Insecurity: Not on file  Transportation Needs: Not on file  Physical Activity: Not on file  Stress: Not on file  Social Connections: Not on file  Intimate Partner Violence: Not on file    FAMILY HISTORY: Family History  Problem Relation Age of Onset   Colon cancer Mother 79   Scleroderma Father 18   Alcoholism Brother    Stomach cancer Neg Hx     ALLERGIES:  is allergic to morphine, benzalkonium chloride, escitalopram, hydralazine, neomycin-bacitracin zn-polymyx, and bacitracin-neomycin-polymyxin.  MEDICATIONS:  Current Outpatient Medications  Medication Sig Dispense Refill   alendronate (FOSAMAX) 70 MG tablet Take 70 mg by mouth once a week.     ALPRAZolam (XANAX) 0.25 MG tablet Take 0.25 mg by mouth 2 (two) times daily as needed.      amLODipine (NORVASC) 5 MG tablet Take 5 mg by mouth daily. Pt states that she she increased the tablet to 5 mg daily. She was taking 2.5 mg. She stated that she thought the half tablet was not working efficiently. I asked the patient to inform her pcp that her dosage was increased.     Ascorbic Acid (VITAMIN C) 1000 MG tablet Take 1,000 mg by mouth daily.     aspirin EC 81 MG tablet Take 81 mg daily by mouth.     b complex vitamins capsule Take 1 capsule by mouth daily.     Biotin 1000 MCG tablet Take 1,000 mcg  by mouth daily.      Cholecalciferol (VITAMIN D3) 2000 units capsule Take 2,000 Units by mouth daily.     Cyanocobalamin (B-12 PO) Take 2,500 mcg by mouth daily.     filgrastim-sndz (ZARXIO) 480 MCG/0.8ML SOSY injection Inject 0.8 mLs (480 mcg total) into the skin once a week. Administer injection subcutaneously 3.2 mL 6   predniSONE (DELTASONE) 5 MG tablet Take 5 mg by mouth daily.     SSD 1 % cream Apply topically.     traMADol (ULTRAM) 50 MG tablet Take 50 mg by mouth daily.     ZINC OXIDE PO Take 1 tablet by mouth daily.     No current facility-administered medications for this visit.    PHYSICAL EXAMINATION: ECOG PERFORMANCE STATUS: 0 - Asymptomatic  There were no vitals filed for this visit.  There were no vitals filed for  this visit.   Physical Exam Constitutional:      Appearance: Normal appearance.  HENT:     Head: Normocephalic and atraumatic.  Eyes:     Pupils: Pupils are equal, round, and reactive to light.  Cardiovascular:     Rate and Rhythm: Normal rate and regular rhythm.     Heart sounds: Normal heart sounds. No murmur heard. Pulmonary:     Effort: Pulmonary effort is normal.     Breath sounds: Normal breath sounds. No wheezing.  Abdominal:     General: Bowel sounds are normal. There is no distension.     Palpations: Abdomen is soft.     Tenderness: There is no abdominal tenderness.  Musculoskeletal:        General: Normal range of motion.     Cervical back: Normal range of motion.  Skin:    General: Skin is warm and dry.     Findings: No rash.  Neurological:     Mental Status: She is alert and oriented to person, place, and time.     Gait: Gait is intact.  Psychiatric:        Mood and Affect: Mood and affect normal.        Cognition and Memory: Memory normal.        Judgment: Judgment normal.     LABORATORY DATA:  I have reviewed the data as listed Lab Results  Component Value Date   WBC 2.5 (L) 05/03/2021   HGB 15.3 (H) 05/03/2021   HCT  48.1 (H) 05/03/2021   MCV 92.5 05/03/2021   PLT 203 05/03/2021   Recent Labs    03/01/21 1105 03/15/21 0908 05/10/21 0925  NA 139 138 138  K 4.3 4.3 3.6  CL 101 100 99  CO2 28 29 29   GLUCOSE 106* 101* 94  BUN 22 20 15   CREATININE 1.04* 1.06* 0.99  CALCIUM 9.2 8.9 9.1  GFRNONAA 53* 52* 56*     RADIOGRAPHIC STUDIES: I have personally reviewed the radiological images as listed and agreed with the findings in the report. No results found.   ASSESSMENT & PLAN: Patient is here for follow-up for autoimmune neutropenia.  Labs from today show a WBC of 2.6 with ANC of 800.  Proceed with Zarxio injection.  She remains asymptomatic.  Has been experiencing some mild joint pain and achiness over the past 3 weeks or so.  She has been taking Claritin and Tylenol.  Compliant with prednisone 5 mg daily.  States that is nothing she cannot handle.  Hypertension-blood pressure elevated today.  Advised that she speak with her PCP regarding adjustment in her dosing given his consistently elevated here at the clinic.  Disposition- Proceed with Zarxio today for ANC of 800. Return to clinic weekly for CBC and Zarxio x8.  She will see Dr. Rogue Bussing in 2 months with labs CBC/BMP +/- Zarxio  I spent 15 minutes dedicated to the care of this patient (face-to-face and non-face-to-face) on the date of the encounter to include what is described in the assessment and plan.  No problem-specific Assessment & Plan notes found for this encounter.  All questions were answered. The patient knows to call the clinic with any problems, questions or concerns.    Jacquelin Hawking, NP 05/10/2021 9:51 AM

## 2021-05-16 DIAGNOSIS — S81812A Laceration without foreign body, left lower leg, initial encounter: Secondary | ICD-10-CM | POA: Diagnosis not present

## 2021-05-17 ENCOUNTER — Other Ambulatory Visit: Payer: Self-pay

## 2021-05-17 ENCOUNTER — Inpatient Hospital Stay: Payer: Medicare Other

## 2021-05-17 DIAGNOSIS — D708 Other neutropenia: Secondary | ICD-10-CM | POA: Diagnosis not present

## 2021-05-17 LAB — CBC WITH DIFFERENTIAL/PLATELET
Abs Immature Granulocytes: 0.03 10*3/uL (ref 0.00–0.07)
Basophils Absolute: 0 10*3/uL (ref 0.0–0.1)
Basophils Relative: 1 %
Eosinophils Absolute: 0 10*3/uL (ref 0.0–0.5)
Eosinophils Relative: 0 %
HCT: 43.2 % (ref 36.0–46.0)
Hemoglobin: 13.5 g/dL (ref 12.0–15.0)
Immature Granulocytes: 1 %
Lymphocytes Relative: 29 %
Lymphs Abs: 0.9 10*3/uL (ref 0.7–4.0)
MCH: 28.9 pg (ref 26.0–34.0)
MCHC: 31.3 g/dL (ref 30.0–36.0)
MCV: 92.5 fL (ref 80.0–100.0)
Monocytes Absolute: 0.4 10*3/uL (ref 0.1–1.0)
Monocytes Relative: 15 %
Neutro Abs: 1.6 10*3/uL — ABNORMAL LOW (ref 1.7–7.7)
Neutrophils Relative %: 54 %
Platelets: 192 10*3/uL (ref 150–400)
RBC: 4.67 MIL/uL (ref 3.87–5.11)
RDW: 15.9 % — ABNORMAL HIGH (ref 11.5–15.5)
Smear Review: NORMAL
WBC: 3 10*3/uL — ABNORMAL LOW (ref 4.0–10.5)
nRBC: 0 % (ref 0.0–0.2)

## 2021-05-18 ENCOUNTER — Encounter: Payer: Medicare Other | Attending: Internal Medicine | Admitting: Internal Medicine

## 2021-05-18 DIAGNOSIS — T798XXD Other early complications of trauma, subsequent encounter: Secondary | ICD-10-CM | POA: Diagnosis not present

## 2021-05-18 DIAGNOSIS — S51002A Unspecified open wound of left elbow, initial encounter: Secondary | ICD-10-CM

## 2021-05-18 DIAGNOSIS — M316 Other giant cell arteritis: Secondary | ICD-10-CM | POA: Diagnosis not present

## 2021-05-18 DIAGNOSIS — M810 Age-related osteoporosis without current pathological fracture: Secondary | ICD-10-CM | POA: Diagnosis not present

## 2021-05-18 DIAGNOSIS — T798XXA Other early complications of trauma, initial encounter: Secondary | ICD-10-CM | POA: Diagnosis not present

## 2021-05-18 DIAGNOSIS — L97822 Non-pressure chronic ulcer of other part of left lower leg with fat layer exposed: Secondary | ICD-10-CM | POA: Diagnosis not present

## 2021-05-18 DIAGNOSIS — W19XXXA Unspecified fall, initial encounter: Secondary | ICD-10-CM | POA: Diagnosis not present

## 2021-05-18 DIAGNOSIS — L98491 Non-pressure chronic ulcer of skin of other sites limited to breakdown of skin: Secondary | ICD-10-CM | POA: Diagnosis not present

## 2021-05-18 NOTE — Progress Notes (Signed)
KATORI, WIRSING ([id number]) Visit Report for 05/18/2021 Chief Complaint Document Details Patient Name: [name], [name] Date of Service: 05/18/2021 10:00 AM Medical Record Number: [id number] Patient Account Number: [id number] Date of Birth/Sex: 08-29-1934 (86 y.o. F) Treating RN: [name] Primary Care Provider: [name] Other Clinician: Referring Provider: [name] Treating Provider/Extender: [name] in Treatment: 0 Information Obtained from: Patient Chief Complaint Left lower extremity and left elbow wounds following a fall Electronic Signature(s) Signed: 05/18/2021 11:18:02 AM By: Kalman Shan DO Entered By: Kalman Shan on 05/18/2021 11:12:16 Cogliano, [name] ([id number]) -------------------------------------------------------------------------------- Debridement Details Patient Name: Tonya Fail T. Date of Service: 05/18/2021 10:00 AM Medical Record Number: [id number] Patient Account Number: [id number] Date of Birth/Sex: May 31, 1934 (86 y.o. F) Treating RN: [name] Primary Care Provider: [name] Other Clinician: Referring Provider: [name] Treating Provider/Extender: [name] in Treatment: 0 Debridement Performed for Wound #1 Left Lower Leg Assessment: Performed By: Physician Kalman Shan, MD Debridement Type: Debridement Level of Consciousness (Pre- Awake and Alert procedure): Pre-procedure Verification/Time Out Yes - 10:55 Taken: Start Time: 10:56 Pain Control: Lidocaine Total Area Debrided (L x W): 10 (cm) x 3.5 (cm) = 35 (cm) Tissue and other material Viable, Non-Viable, Subcutaneous, Skin: Dermis debrided: Level: Skin/Subcutaneous Tissue Debridement Description: Excisional Instrument: Forceps, Scissors Bleeding: Moderate Hemostasis Achieved: Pressure Response to Treatment: Procedure was tolerated well Level of Consciousness (Post- Awake and Alert procedure): Post  Debridement Measurements of Total Wound Length: (cm) 10 Width: (cm) 3.5 Depth: (cm) 0.1 Volume: (cm) 2.749 Character of Wound/Ulcer Post Debridement: Improved Post Procedure Diagnosis Same as Pre-procedure Electronic Signature(s) Signed: 05/18/2021 11:18:02 AM By: Kalman Shan DO Signed: 05/18/2021 4:04:38 PM By: [name] Entered By: [name] on 05/18/2021 11:00:48 Capano, [name] ([id number]) -------------------------------------------------------------------------------- Debridement Details Patient Name: Tonya Fail T. Date of Service: 05/18/2021 10:00 AM Medical Record Number: [id number] Patient Account Number: [id number] Date of Birth/Sex: April 29, 1935 (86 y.o. F) Treating RN: [name] Primary Care Provider: [name] Other Clinician: Referring Provider: [name] Treating Provider/Extender: [name] in Treatment: 0 Debridement Performed for Wound #2 Left Elbow Assessment: Performed By: Physician Kalman Shan, MD Debridement Type: Chemical/Enzymatic/Mechanical Agent Used: Gauze and saline Level of Consciousness (Pre- Awake and Alert procedure): Pre-procedure Verification/Time Out Yes - 10:55 Taken: Start Time: 10:59 Pain Control: Lidocaine Instrument: Forceps, Scissors Bleeding: Moderate Hemostasis Achieved: Pressure Response to Treatment: Procedure was tolerated well Level of Consciousness (Post- Awake and Alert procedure): Post Debridement Measurements of Total Wound Length: (cm) 1.2 Width: (cm) 0.3 Depth: (cm) 0.1 Volume: (cm) 0.028 Character of Wound/Ulcer Post Debridement: Improved Post Procedure Diagnosis Same as Pre-procedure Electronic Signature(s) Signed: 05/18/2021 11:18:02 AM By: Kalman Shan DO Signed: 05/18/2021 4:04:38 PM By: [name] Entered By: [name] on 05/18/2021 11:03:10 [name], [name]  ([id number]) -------------------------------------------------------------------------------- HPI Details Patient Name: Tonya Fail T. Date of Service: 05/18/2021 10:00 AM Medical Record Number: [id number] Patient Account Number: [id number] Date of Birth/Sex: 23-Apr-1935 (86 y.o. F) Treating RN: [name] Primary Care Provider: [name] Other Clinician: Referring Provider: [name] Treating Provider/Extender: [name] in Treatment: 0 History of Present Illness HPI Description: Admission 05/18/2021 [name] is an 86 year old female with a past medical history of osteoporosis, temporal arteritis and hypertension that presents to the clinic for a left lower extremity wound and left elbow wound due to a fall. She states she went to the attic to retrieve an item and she fell and injured her left side. She visited dermatology for this issue  and was referred to our clinic. She is currently been keeping the area covered. She currently denies signs of infection. She reports mild tenderness to the wound beds. Electronic Signature(s) Signed: 05/18/2021 11:18:02 AM By: Kalman Shan DO Entered By: Kalman Shan on 05/18/2021 11:14:22 [name], [name] ([id number]) -------------------------------------------------------------------------------- Physical Exam Details Patient Name: Tonya Fail T. Date of Service: 05/18/2021 10:00 AM Medical Record Number: [id number] Patient Account Number: [id number] Date of Birth/Sex: 11/20/1934 (86 y.o. F) Treating RN: [name] Primary Care Provider: [name] Other Clinician: Referring Provider: [name] Treating Provider/Extender: [name] in Treatment: 0 Constitutional . Cardiovascular . Psychiatric . Notes Left lower extremity: Open wound with devitalized tissue. Bruising surrounding the wound bed. No signs of infection on exam. To the left elbow there is an open wound  with granulation tissue and scant nonviable tissue. No surrounding signs of infection. Electronic Signature(s) Signed: 05/18/2021 11:18:02 AM By: Kalman Shan DO Entered By: Kalman Shan on 05/18/2021 11:15:33 [name], [name] ([id number]) -------------------------------------------------------------------------------- Physician Orders Details Patient Name: Tonya Fail T. Date of Service: 05/18/2021 10:00 AM Medical Record Number: [id number] Patient Account Number: [id number] Date of Birth/Sex: 1935/01/04 (86 y.o. F) Treating RN: [name] Primary Care Provider: [name] Other Clinician: Referring Provider: [name] Treating Provider/Extender: [name] in Treatment: 0 Verbal / Phone Orders: No Diagnosis Coding ICD-10 Coding Code Description [id number] Non-pressure chronic ulcer of other part of left lower leg with fat layer exposed S51.002A Unspecified open wound of left elbow, initial encounter M31.6 Other giant cell arteritis M81.0 Age-related osteoporosis without current pathological fracture T79.8XXD Other early complications of trauma, subsequent encounter Follow-up Appointments o Return Appointment in 1 week. o Nurse Visit as needed Bathing/ Shower/ Hygiene o Clean wound with Normal Saline or wound cleanser. o May shower; gently cleanse wound with antibacterial soap, rinse and pat dry prior to dressing wounds o No tub bath. Anesthetic (Use 'Patient Medications' Section for Anesthetic Order Entry) o Lidocaine applied to wound bed Edema Control - Lymphedema / Segmental Compressive Device / Other o Elevate, Exercise Daily and Avoid Standing for Long Periods of Time. o Elevate leg(s) parallel to the floor when sitting. o DO YOUR BEST to sleep in the bed at night. DO NOT sleep in your recliner. Long hours of sitting in a recliner leads to swelling of the legs and/or potential wounds on your backside. Additional Orders  / Instructions o Follow Nutritious Diet and Increase Protein Intake Wound Treatment Wound #1 - Lower Leg Wound Laterality: Left Cleanser: [place]ram Ancillary Kit - 15 Day Supply (DME) (Generic) Every Other Day/15 Days Discharge Instructions: Use supplies as instructed; Kit contains: (15) Saline Bullets; (15) 3x3 Gauze; 15 pr Gloves Cleanser: Normal Saline (Generic) Every Other Day/15 Days Discharge Instructions: Wash your hands with soap and water. Remove old dressing, discard into plastic bag and place into trash. Cleanse the wound with Normal Saline prior to applying a clean dressing using gauze sponges, not tissues or cotton balls. Do not scrub or use excessive force. Pat dry using gauze sponges, not tissue or cotton balls. Cleanser: Soap and Water Every Other Day/15 Days Discharge Instructions: Gently cleanse wound with antibacterial soap, rinse and pat dry prior to dressing wounds Primary Dressing: Hydrofera Blue Ready Transfer Foam, 4x5 (in/in) (DME) (Generic) Every Other Day/15 Days Discharge Instructions: Apply Hydrofera Blue Ready to wound bed as directed Secondary Dressing: ABD Pad 5x9 (in/in) (DME) (Generic) Every Other Day/15 Days Discharge Instructions: Cover with ABD pad Secured With: [place]  Cloth Surgical Tape, 2x2 (in/yd) (DME) (Generic) Every Other Day/15 Days Secured With: Kerlix Roll Sterile or Non-Sterile 6-ply 4.5x4 (yd/yd) (DME) (Generic) Every Other Day/15 Days Discharge Instructions: Apply Kerlix as directed [place], Tabatha T. ([id number]) Wound #2 - Elbow Wound Laterality: Left Cleanser: [place]ram Ancillary Kit - 15 Day Supply (DME) (Generic) Every Other Day/15 Days Discharge Instructions: Use supplies as instructed; Kit contains: (15) Saline Bullets; (15) 3x3 Gauze; 15 pr Gloves Cleanser: Normal Saline (Generic) Every Other Day/15 Days Discharge Instructions: Wash your hands with soap and water. Remove old dressing, discard into plastic bag and place into  trash. Cleanse the wound with Normal Saline prior to applying a clean dressing using gauze sponges, not tissues or cotton balls. Do not scrub or use excessive force. Pat dry using gauze sponges, not tissue or cotton balls. Cleanser: Soap and Water Every Other Day/15 Days Discharge Instructions: Gently cleanse wound with antibacterial soap, rinse and pat dry prior to dressing wounds Primary Dressing: Hydrofera Blue Ready Transfer Foam, 2.5x2.5 (in/in) Every Other Day/15 Days Discharge Instructions: Apply Hydrofera Blue Ready to wound bed as directed Secondary Dressing: [place] Dressing, 4x4 (in/in) (DME) (Generic) Every Other Day/15 Days Discharge Instructions: Apply over dressing to secure in place. Electronic Signature(s) Signed: 05/18/2021 11:18:02 AM By: Kalman Shan DO Signed: 05/18/2021 4:04:38 PM By: [name] Entered By: [name] on 05/18/2021 11:07:24 [name], [name] ([id number]) -------------------------------------------------------------------------------- Problem List Details Patient Name: Tonya Fail T. Date of Service: 05/18/2021 10:00 AM Medical Record Number: [id number] Patient Account Number: [id number] Date of Birth/Sex: 1934-12-22 (86 y.o. F) Treating RN: [name] Primary Care Provider: [name] Other Clinician: Referring Provider: [name] Treating Provider/Extender: [name] in Treatment: 0 Active Problems ICD-10 Encounter Code Description Active Date MDM Diagnosis [phone] Non-pressure chronic ulcer of other part of left lower leg with fat layer 05/18/2021 No Yes exposed S51.002A Unspecified open wound of left elbow, initial encounter 05/18/2021 No Yes L98.491 Non-pressure chronic ulcer of skin of other sites limited to breakdown of 05/18/2021 No Yes skin M31.6 Other giant cell arteritis 05/18/2021 No Yes M81.0 Age-related osteoporosis without current pathological fracture 05/18/2021 No Yes T79.8XXD  Other early complications of trauma, subsequent encounter 05/18/2021 No Yes Inactive Problems Resolved Problems Electronic Signature(s) Signed: 05/18/2021 11:18:02 AM By: Kalman Shan DO Entered By: Kalman Shan on 05/18/2021 11:11:44 [name], [name] ([id number]) -------------------------------------------------------------------------------- Progress Note Details Patient Name: Tonya Fail T. Date of Service: 05/18/2021 10:00 AM Medical Record Number: [id number] Patient Account Number: [id number] Date of Birth/Sex: 06/21/1934 (86 y.o. F) Treating RN: [name] Primary Care Provider: [name] Other Clinician: Referring Provider: [name] Treating Provider/Extender: [name] in Treatment: 0 Subjective Chief Complaint Information obtained from Patient Left lower extremity and left elbow wounds following a fall History of Present Illness (HPI) Admission 05/18/2021 [name] is an 86 year old female with a past medical history of osteoporosis, temporal arteritis and hypertension that presents to the clinic for a left lower extremity wound and left elbow wound due to a fall. She states she went to the attic to retrieve an item and she fell and injured her left side. She visited dermatology for this issue and was referred to our clinic. She is currently been keeping the area covered. She currently denies signs of infection. She reports mild tenderness to the wound beds. Patient History Information obtained from Patient. Allergies No Known Allergies Social History Former smoker - ended on 05/29/2004, Marital Status - Divorced, Alcohol Use - Rarely, Drug Use -  No History, Caffeine Use - Moderate. Medical History Cardiovascular Patient has history of Coronary Artery Disease Neurologic Patient has history of Neuropathy - hx/says resolved Denies history of Seizure Disorder Oncologic Denies history of Received Chemotherapy, Received  Radiation Review of Systems (ROS) Constitutional Symptoms (General Health) Denies complaints or symptoms of Fatigue, Fever, Chills, Marked Weight Change. Eyes Denies complaints or symptoms of Dry Eyes, Vision Changes, Glasses / Contacts. Ear/Nose/Mouth/Throat Denies complaints or symptoms of Difficult clearing ears, Sinusitis. Hematologic/Lymphatic autoimmune neutropenia/takes shots Respiratory Denies complaints or symptoms of Chronic or frequent coughs, Shortness of Breath. Cardiovascular Complains or has symptoms of LE edema - ankles. Gastrointestinal IBS resolved post surgery HX hx diverticulosis resolved 2019 colostomy and then resection/reversed ostomy Endocrine Denies complaints or symptoms of Hepatitis, Thyroid disease, Polydypsia (Excessive Thirst), hx pancreatitis resolved Genitourinary hx nephroithiasis/resolved Immunological auto immune neutropenia under tx Integumentary (Skin) follows with Dr. Dasher/dermatology Musculoskeletal Denies complaints or symptoms of Muscle Pain, Muscle Weakness. Oncologic breast cancer 1985 double mastectomy reconstruction later Psychiatric Complains or has symptoms of Anxiety, hx depression episode/resolved [name], Tonya T. ([id number]) Objective Constitutional Vitals Time Taken: 10:10 AM, Height: 65 in, Source: Stated, Weight: 169 lbs, Source: Measured, BMI: 28.1, Temperature: 98.0 F, Pulse: 108 bpm, Respiratory Rate: 16 breaths/min, Blood Pressure: 156/75 mmHg. General Notes: Left lower extremity: Open wound with devitalized tissue. Bruising surrounding the wound bed. No signs of infection on exam. To the left elbow there is an open wound with granulation tissue and scant nonviable tissue. No surrounding signs of infection. Integumentary (Hair, Skin) Wound #1 status is Open. Original cause of wound was Skin Tear/Laceration. The date acquired was: 05/16/2021. The wound is located on the Left Lower Leg. The wound measures 10cm  length x 3.5cm width x 0.1cm depth; 27.489cm^2 area and 2.749cm^3 volume. There is Fat Layer (Subcutaneous Tissue) exposed. There is no tunneling or undermining noted. There is a large amount of serosanguineous drainage noted. There is large (67-100%) red, pink granulation within the wound bed. There is a small (1-33%) amount of necrotic tissue within the wound bed including Eschar and Adherent Slough. Wound #2 status is Open. Original cause of wound was Skin Tear/Laceration. The date acquired was: 05/16/2021. The wound is located on the Left Elbow. The wound measures 1.2cm length x 0.3cm width x 0.1cm depth; 0.283cm^2 area and 0.028cm^3 volume. The wound is limited to skin breakdown. There is no tunneling or undermining noted. There is a medium amount of serosanguineous drainage noted. There is medium (34-66%) red, pink granulation within the wound bed. There is a medium (34-66%) amount of necrotic tissue within the wound bed including Eschar and Adherent Slough. Assessment Active Problems ICD-10 Non-pressure chronic ulcer of other part of left lower leg with fat layer exposed Unspecified open wound of left elbow, initial encounter Non-pressure chronic ulcer of skin of other sites limited to breakdown of skin Other giant cell arteritis Age-related osteoporosis without current pathological fracture Other early complications of trauma, subsequent encounter Patient presents with a wound to her left lower extremity and left elbow due to a fall. I debrided nonviable tissue. No signs of infection on exam. I recommended Hydrofera Blue to both wound sites. Follow-up in 1 week. Procedures Wound #1 Pre-procedure diagnosis of Wound #1 is a Trauma, Other located on the Left Lower Leg . There was a Excisional Skin/Subcutaneous Tissue Debridement with a total area of 35 sq cm performed by Kalman Shan, MD. With the following instrument(s): Forceps, and Scissors to remove Viable and Non-Viable  tissue/material. Material  removed includes Subcutaneous Tissue and Skin: Dermis and after achieving pain control using Lidocaine. A time out was conducted at 10:55, prior to the start of the procedure. A Moderate amount of bleeding was controlled with Pressure. The procedure was tolerated well. Post Debridement Measurements: 10cm length x 3.5cm width x 0.1cm depth; 2.749cm^3 volume. Character of Wound/Ulcer Post Debridement is improved. Post procedure Diagnosis Wound #1: Same as Pre-Procedure Wound #2 Pre-procedure diagnosis of Wound #2 is a Trauma, Other located on the Left Elbow . There was a Chemical/Enzymatic/Mechanical debridement performed by Kalman Shan, MD. With the following instrument(s): Forceps, and Scissors to remove Viable and Non-Viable tissue/material. after achieving pain control using Lidocaine. Other agent used was Gauze and saline. A time out was conducted at 10:55, prior to the start of the procedure. A Moderate amount of bleeding was controlled with Pressure. The procedure was tolerated well. Post Debridement Measurements: 1.2cm length x 0.3cm width x 0.1cm depth; 0.028cm^3 volume. Character of Wound/Ulcer Post Debridement is improved. Post procedure Diagnosis Wound #2: Same as Pre-Procedure [name], Tonya T. ([id number]) Plan Follow-up Appointments: Return Appointment in 1 week. Nurse Visit as needed Bathing/ Shower/ Hygiene: Clean wound with Normal Saline or wound cleanser. May shower; gently cleanse wound with antibacterial soap, rinse and pat dry prior to dressing wounds No tub bath. Anesthetic (Use 'Patient Medications' Section for Anesthetic Order Entry): Lidocaine applied to wound bed Edema Control - Lymphedema / Segmental Compressive Device / Other: Elevate, Exercise Daily and Avoid Standing for Long Periods of Time. Elevate leg(s) parallel to the floor when sitting. DO YOUR BEST to sleep in the bed at night. DO NOT sleep in your recliner. Long hours  of sitting in a recliner leads to swelling of the legs and/or potential wounds on your backside. Additional Orders / Instructions: Follow Nutritious Diet and Increase Protein Intake WOUND #1: - Lower Leg Wound Laterality: Left Cleanser: Byram Ancillary Kit - 15 Day Supply (DME) (Generic) Every Other Day/15 Days Discharge Instructions: Use supplies as instructed; Kit contains: (15) Saline Bullets; (15) 3x3 Gauze; 15 pr Gloves Cleanser: Normal Saline (Generic) Every Other Day/15 Days Discharge Instructions: Wash your hands with soap and water. Remove old dressing, discard into plastic bag and place into trash. Cleanse the wound with Normal Saline prior to applying a clean dressing using gauze sponges, not tissues or cotton balls. Do not scrub or use excessive force. Pat dry using gauze sponges, not tissue or cotton balls. Cleanser: Soap and Water Every Other Day/15 Days Discharge Instructions: Gently cleanse wound with antibacterial soap, rinse and pat dry prior to dressing wounds Primary Dressing: Hydrofera Blue Ready Transfer Foam, 4x5 (in/in) (DME) (Generic) Every Other Day/15 Days Discharge Instructions: Apply Hydrofera Blue Ready to wound bed as directed Secondary Dressing: ABD Pad 5x9 (in/in) (DME) (Generic) Every Other Day/15 Days Discharge Instructions: Cover with ABD pad Secured With: 68M Medipore H Soft Cloth Surgical Tape, 2x2 (in/yd) (DME) (Generic) Every Other Day/15 Days Secured With: Kerlix Roll Sterile or Non-Sterile 6-ply 4.5x4 (yd/yd) (DME) (Generic) Every Other Day/15 Days Discharge Instructions: Apply Kerlix as directed WOUND #2: - Elbow Wound Laterality: Left Cleanser: [place] Ancillary Kit - 15 Day Supply (DME) (Generic) Every Other Day/15 Days Discharge Instructions: Use supplies as instructed; Kit contains: (15) Saline Bullets; (15) 3x3 Gauze; 15 pr Gloves Cleanser: Normal Saline (Generic) Every Other Day/15 Days Discharge Instructions: Wash your hands with soap and  water. Remove old dressing, discard into plastic bag and place into trash. Cleanse the wound with Normal Saline prior  to applying a clean dressing using gauze sponges, not tissues or cotton balls. Do not scrub or use excessive force. Pat dry using gauze sponges, not tissue or cotton balls. Cleanser: Soap and Water Every Other Day/15 Days Discharge Instructions: Gently cleanse wound with antibacterial soap, rinse and pat dry prior to dressing wounds Primary Dressing: Hydrofera Blue Ready Transfer Foam, 2.5x2.5 (in/in) Every Other Day/15 Days Discharge Instructions: Apply Hydrofera Blue Ready to wound bed as directed Secondary Dressing: [place] Dressing, 4x4 (in/in) (DME) (Generic) Every Other Day/15 Days Discharge Instructions: Apply over dressing to secure in place. 1. In office sharp debridement 2. Hydrofera Blue 3. Follow-up in 1 week Electronic Signature(s) Signed: 05/18/2021 11:18:02 AM By: Kalman Shan DO Entered By: Kalman Shan on 05/18/2021 11:17:03 [name], [name] ([id number]) -------------------------------------------------------------------------------- ROS/PFSH Details Patient Name: Tonya Fail T. Date of Service: 05/18/2021 10:00 AM Medical Record Number: [id number] Patient Account Number: [id number] Date of Birth/Sex: 10-Jan-1935 (86 y.o. F) Treating RN: [name] Primary Care Provider: [name] Other Clinician: Referring Provider: [name] Treating Provider/Extender: [name] in Treatment: 0 Information Obtained From Patient Constitutional Symptoms (General Health) Complaints and Symptoms: Negative for: Fatigue; Fever; Chills; Marked Weight Change Eyes Complaints and Symptoms: Negative for: Dry Eyes; Vision Changes; Glasses / Contacts Ear/Nose/Mouth/Throat Complaints and Symptoms: Negative for: Difficult clearing ears; Sinusitis Respiratory Complaints and Symptoms: Negative for: Chronic or frequent  coughs; Shortness of Breath Cardiovascular Complaints and Symptoms: Positive for: LE edema - ankles Medical History: Positive for: Coronary Artery Disease Endocrine Complaints and Symptoms: Negative for: Hepatitis; Thyroid disease; Polydypsia (Excessive Thirst) Review of System Notes: hx pancreatitis resolved Musculoskeletal Complaints and Symptoms: Negative for: Muscle Pain; Muscle Weakness Psychiatric Complaints and Symptoms: Positive for: Anxiety Review of System Notes: hx depression episode/resolved Hematologic/Lymphatic Complaints and Symptoms: Review of System Notes: autoimmune neutropenia/takes shots Gastrointestinal Complaints and Symptoms: Review of System Notes: DEEKSHA, COTRELL ([id number]) IBS resolved post surgery HX hx diverticulosis resolved 2019 colostomy and then resection/reversed ostomy Genitourinary Complaints and Symptoms: Review of System Notes: hx nephroithiasis/resolved Immunological Complaints and Symptoms: Review of System Notes: auto immune neutropenia under tx Integumentary (Skin) Complaints and Symptoms: Review of System Notes: follows with Dr. Dasher/dermatology Neurologic Medical History: Positive for: Neuropathy - hx/says resolved Negative for: Seizure Disorder Oncologic Complaints and Symptoms: Review of System Notes: breast cancer 1985 double mastectomy reconstruction later Medical History: Negative for: Received Chemotherapy; Received Radiation Immunizations Pneumococcal Vaccine: Received Pneumococcal Vaccination: Yes Received Pneumococcal Vaccination On or After 60th Birthday: Yes Implantable Devices None Family and Social History Former smoker - ended on 05/29/2004; Marital Status - Divorced; Alcohol Use: Rarely; Drug Use: No History; Caffeine Use: Moderate Electronic Signature(s) Signed: 05/18/2021 11:18:02 AM By: Kalman Shan DO Signed: 05/18/2021 4:04:38 PM By: [name] Entered By: [name] on  05/18/2021 10:20:47 Brosious, [name] ([id number]) -------------------------------------------------------------------------------- SuperBill Details Patient Name: Tonya Fail T. Date of Service: 05/18/2021 Medical Record Number: [id number] Patient Account Number: [id number] Date of Birth/Sex: 1934-09-11 (86 y.o. 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 M81.0 Age-related osteoporosis without current pathological fracture T79.8XXD Other early complications of trauma, subsequent encounter Facility Procedures  CPT4 Code: 16109604 Description: 54098 - DEB SUBQ TISSUE 20 SQ CM/< Modifier: Quantity: 1 CPT4 Code: Description: ICD-10 Diagnosis Description L97.822 Non-pressure chronic ulcer of other part of left lower leg with fat layer Modifier: exposed Quantity: CPT4 Code: 11914782 Description: 95621 - DEB SUBQ TISS EA ADDL 20CM Modifier: Quantity: 1 CPT4 Code: Description: ICD-10 Diagnosis Description L97.822 Non-pressure chronic ulcer of other part of left lower leg with fat layer Modifier: exposed Quantity: Physician Procedures CPT4 Code: 3086578 Description: WC PHYS LEVEL 3 o NEW PT Modifier: Quantity: 1 CPT4 Code: Description: ICD-10 Diagnosis Description L97.822 Non-pressure chronic ulcer of other part of left lower leg with fat layer S51.002A Unspecified open wound of left elbow, initial encounter L98.491 Non-pressure chronic ulcer of skin of other sites limited  to breakdown of T79.8XXD Other early  complications of trauma, subsequent encounter Modifier: exposed skin Quantity: CPT4 Code: 4696295 Description: 11042 - WC PHYS SUBQ TISS 20 SQ CM Modifier: Quantity: 1 CPT4 Code: Description: ICD-10 Diagnosis Description L97.822 Non-pressure chronic ulcer of other part of left lower leg with fat layer Modifier: exposed Quantity: CPT4 Code: 2841324 Description: 11045 - WC PHYS SUBQ TISS EA ADDL 20 CM Modifier: Quantity: 1 CPT4 Code: Description: ICD-10 Diagnosis Description L97.822 Non-pressure chronic ulcer of other part of left lower leg with fat layer Modifier: exposed Quantity: Electronic Signature(s) Signed: 05/18/2021 11:40:38 AM By: Kalman Shan DO Signed: 05/18/2021 4:04:38 PM By: [name] Previous Signature: 05/18/2021 11:18:02 AM Version By: Kalman Shan DO Entered By: [name] on 05/18/2021 11:20:35

## 2021-05-18 NOTE — Progress Notes (Signed)
Tonya, Robbins ([id number]) Visit Report for 05/18/2021 Abuse/Suicide Risk Screen Details Patient Name: [name], [name] Date of Service: 05/18/2021 10:00 AM Medical Record Number: [id number] Patient Account Number: [id number] Date of Birth/Sex: 17-Jan-1935 (86 y.o. F) Treating RN: [name] Primary Care Lynze Reddy: [name] Other Clinician: Referring Chandani Rogowski: [name] Treating Bradlee Heitman/Extender: [name] in Treatment: 0 Abuse/Suicide Risk Screen Items Answer ABUSE RISK SCREEN: Has anyone close to you tried to hurt or harm you recentlyo No Do you feel uncomfortable with anyone in your familyo No Has anyone forced you do things that you didnot want to doo No Electronic Signature(s) Signed: 05/18/2021 4:04:38 PM By: [name] Entered By: [name] on 05/18/2021 10:20:53 [name] ([id number]) -------------------------------------------------------------------------------- Activities of Daily Living Details Patient Name: [name] T. Date of Service: 05/18/2021 10:00 AM Medical Record Number: [id number] Patient Account Number: [id number] Date of Birth/Sex: 10-Dec-1934 (86 y.o. F) Treating RN: [name] Primary Care Zharia Conrow: [name] Other Clinician: Referring Yavuz Kirby: [name] Treating Laureano Hetzer/Extender: [name] in Treatment: 0 Activities of Daily Living Items Answer Activities of Daily Living (Please select one for each item) Drive Automobile Completely Able Take Medications Completely Able Use Telephone Completely Able Care for Appearance Completely Able Use Toilet Completely Able Bath / Shower Completely Able Dress Self Completely Able Feed Self Completely Able Walk Completely Able Get In / Out Bed Completely Able Housework Completely Able Prepare Meals Completely Able Handle Money Completely Able Shop for Self Completely Able Electronic Signature(s) Signed: 05/18/2021 4:04:38 PM  By: [name] Entered By: [name] on 05/18/2021 10:21:12 Magoon, Arlyce Robbins ([id number]) -------------------------------------------------------------------------------- Education Screening Details Patient Name: [name] T. Date of Service: 05/18/2021 10:00 AM Medical Record Number: [id number] Patient Account Number: [id number] Date of Birth/Sex: 19-Dec-1934 (86 y.o. F) Treating RN: [name] Primary Care Davan Hark: [name] Other Clinician: Referring Trashawn Oquendo: [name] Treating Tannah Dreyfuss/Extender: [name] in Treatment: 0 Primary Learner Assessed: Patient Learning Preferences/Education Level/Primary Language Learning Preference: Explanation Highest Education Level: College or Above Preferred Language: English Cognitive Barrier Language Barrier: No Translator Needed: No Memory Deficit: No Emotional Barrier: No Cultural/Religious Beliefs Affecting Medical Care: No Physical Barrier Impaired Vision: No Impaired Hearing: No Decreased Hand dexterity: No Knowledge/Comprehension Knowledge Level: High Comprehension Level: High Ability to understand written instructions: High Ability to understand verbal instructions: High Motivation Anxiety Level: Calm Cooperation: Cooperative Education Importance: Acknowledges Need Interest in Health Problems: Asks Questions Perception: Coherent Willingness to Engage in Self-Management High Activities: Readiness to Engage in Self-Management High Activities: Electronic Signature(s) Signed: 05/18/2021 4:04:38 PM By: [name] Entered ByDonnamarie Poag on 05/18/2021 10:22:01 [name] ([id number]) -------------------------------------------------------------------------------- Fall Risk Assessment Details Patient Name: [name] T. Date of Service: 05/18/2021 10:00 AM Medical Record Number: [id number] Patient Account Number: [id number] Date of Birth/Sex: [birth year] (86 y.o.  F) Treating RN: [name] Primary Care Nazaret Chea: [name] Other Clinician: Referring Rachal Dvorsky: [name] Treating Vani Gunner/Extender: [name] in Treatment: 0 Fall Risk Assessment Items Have you had 2 or more falls in the last 12 monthso 0 No Have you had any fall that resulted in injury in the last 12 monthso 0 Yes FALLS RISK SCREEN History of falling - immediate or within 3 months 25 Yes Secondary diagnosis (Do you have 2 or more medical diagnoseso) 15 Yes Ambulatory aid None/bed rest/wheelchair/nurse 0 Yes Crutches/cane/walker 0 No Furniture 0 No Intravenous therapy Access/Saline/Heparin Lock 0 No Gait/Transferring Normal/ bed rest/ wheelchair 0 Yes Weak (short steps with or without shuffle,  stooped but able to lift head while walking, may 0 No seek support from furniture) Impaired (short steps with shuffle, may have difficulty arising from chair, head down, impaired 0 No balance) Mental Status Oriented to own ability 0 Yes Electronic Signature(s) Signed: 05/18/2021 4:04:38 PM By: [name] Entered By: [name] on 05/18/2021 10:22:22 [name] ([id number]) -------------------------------------------------------------------------------- Foot Assessment Details Patient Name: [name] T. Date of Service: 05/18/2021 10:00 AM Medical Record Number: [id number] Patient Account Number: [id number] Date of Birth/Sex: 1935/02/05 (86 y.o. F) Treating RN: [name] Primary Care Brandis Matsuura: [name] Other Clinician: Referring Jaren Vanetten: [name] Treating Teddy Rebstock/Extender: [name] in Treatment: 0 Foot Assessment Items Site Locations + = Sensation present, - = Sensation absent, C = Callus, U = Ulcer R = Redness, W = Warmth, M = Maceration, PU = Pre-ulcerative lesion F = Fissure, S = Swelling, D = Dryness Assessment Right: Left: Other Deformity: No No Prior Foot Ulcer: No No Prior Amputation: No  No Charcot Joint: No No Ambulatory Status: Ambulatory Without Help Gait: Steady Electronic Signature(s) Signed: 05/18/2021 4:04:38 PM By: [name] Entered By: [name] on 05/18/2021 10:30:42 Miyasaki, Arlyce Robbins ([id number]) -------------------------------------------------------------------------------- Nutrition Risk Screening Details Patient Name: [name] T. Date of Service: 05/18/2021 10:00 AM Medical Record Number: [id number] Patient Account Number: [id number] Date of Birth/Sex: May 07, 1935 (86 y.o. F) Treating RN: [name] Primary Care Shandie Bertz: [name] Other Clinician: Referring Texanna Hilburn: [name] Treating Ragna Kramlich/Extender: [name] in Treatment: 0 Height (in): 65 Weight (lbs): 169 Body Mass Index (BMI): 28.1 Nutrition Risk Screening Items Score Screening NUTRITION RISK SCREEN: I have an illness or condition that made me change the kind and/or amount of food I eat 0 No I eat fewer than two meals per day 0 No I eat few fruits and vegetables, or milk products 0 No I have three or more drinks of beer, liquor or wine almost every day 0 No I have tooth or mouth problems that make it hard for me to eat 0 No I don't always have enough money to buy the food I need 0 No I eat alone most of the time 1 Yes I take three or more different prescribed or over-the-counter drugs a day 0 No Without wanting to, I have lost or gained 10 pounds in the last six months 0 No I am not always physically able to shop, cook and/or feed myself 0 No Nutrition Protocols Good Risk Protocol 0 No interventions needed Moderate Risk Protocol High Risk Proctocol Risk Level: Good Risk Score: 1 Electronic Signature(s) Signed: 05/18/2021 4:04:38 PM By: [name] Entered ByDonnamarie Poag on 05/18/2021 10:22:34

## 2021-05-18 NOTE — Progress Notes (Signed)
[name], [name] ([id number]) Visit Report for 05/18/2021 Allergy List Details Patient Name: [name], [name] Date of Service: 05/18/2021 10:00 AM Medical Record Number: [id number] Patient Account Number: [id number] Date of Birth/Sex: [birth year] (86 y.o. F) Treating RN: [name] Primary Care [name]: [name] Other Clinician: Referring [name]: [name] Treating [name]/Extender: [name] in Treatment: 0 Allergies Active Allergies No Known Allergies Allergy Notes Electronic Signature(s) Signed: 05/18/2021 4:04:38 PM By: [name] Entered By: [name] on 05/18/2021 10:13:11 [name], [name] ([id number]) -------------------------------------------------------------------------------- Arrival Information Details Patient Name: [name] Date of Service: 05/18/2021 10:00 AM Medical Record Number: [id number] Patient Account Number: [id number] Date of Birth/Sex: 1934/10/20 (86 y.o. F) Treating RN: [name] Primary Care [name]: [name] Other Clinician: Referring [name]: [name] Treating Shaelynn Dragos/Extender: [name] in Treatment: 0 Visit Information Patient Arrived: Ambulatory Arrival Time: 10:09 Accompanied By: self Transfer Assistance: None Patient Identification Verified: Yes Secondary Verification Process Completed: Yes Patient Requires Transmission-Based No Precautions: Patient Has Alerts: Yes Patient Alerts: Patient on Blood Thinner 60m Aspirin Electronic Signature(s) Signed: 05/18/2021 4:04:38 PM By: BDonnamarie PoagEntered By: BDonnamarie Poagon 05/18/2021 10:09:56 [name]([id number] -------------------------------------------------------------------------------- Clinic Level of Care Assessment Details Patient Name: [name] Date of Service: 05/18/2021 10:00 AM Medical Record Number: [id number] Account Number: [id number] of Birth/Sex: 3Nov 15, 1936 (86 y.o. F) Treating RN: BDonnamarie PoagPrimary Care [name]: JHarrel LemonOther Clinician: Referring [name]: DKirkland HunTreating Jennalyn Cawley/Extender: HYaakov Guthriein Treatment: 0 Clinic Level of Care Assessment Items TOOL 1 Quantity Score _0  - Use when EandM and Procedure is performed on INITIAL visit 0 ASSESSMENTS - Nursing Assessment / Reassessment X - General Physical Exam (combine w/ comprehensive assessment (listed just below) when performed on new 1 20 pt. evals) X- 1 25 Comprehensive Assessment (HX, ROS, Risk Assessments, Wounds Hx, etc.) ASSESSMENTS - Wound and Skin Assessment / Reassessment _1  - Dermatologic / Skin Assessment (not related to wound area) 0 ASSESSMENTS - Ostomy and/or Continence Assessment and Care _2  - Incontinence Assessment and Management 0 _3  - 0 Ostomy Care Assessment and Management (repouching, etc.) PROCESS - Coordination of Care X - Simple Patient / Family Education for ongoing care 1 15 _4  - 0 Complex (extensive) Patient / Family Education for ongoing care X- 1 10 Staff obtains CProgrammer, systems Records, Test Results / Process Orders _5  - 0 Staff telephones HHA, Nursing Homes / Clarify orders / etc _6  - 0 Routine Transfer to another Facility (non-emergent condition) _7  - 0 Routine Hospital Admission (non-emergent condition) X- 1 15 New Admissions / IBiomedical engineer/ Ordering NPWT, Apligraf, etc. _8  - 0 Emergency Hospital Admission (emergent condition) PROCESS - Special Needs _9  - Pediatric / Minor Patient Management 0 _10  - 0 Isolation Patient Management _11  - 0 Hearing / Language / Visual special needs _12  - 0 Assessment of Community assistance (transportation, D/C planning, etc.) _13  - 0 Additional assistance / Altered mentation _14  - 0 Support Surface(s) Assessment (bed, cushion, seat, etc.) INTERVENTIONS - Miscellaneous _15  - External ear exam 0 _16  - 0 Patient Transfer (multiple staff / HCivil Service fast streamer/ Similar devices) _17   - 0 Simple Staple / Suture removal (25 or less) _18  - 0 Complex Staple / Suture removal (26 or more) _19  - 0 Hypo/Hyperglycemic Management (do not check if billed separately) X- 1 15 Ankle / Brachial Index (ABI) - do not check if billed separately Has the patient been seen at the hospital within the last three years: Yes Total  Score: 100 Level Of Care: New/Established - Level 3 [name] ([id number]) Electronic Signature(s) Signed: 05/18/2021 4:04:38 PM By: [name] Entered By: [name] on 05/18/2021 11:20:13 [name], [name] ([id number]) -------------------------------------------------------------------------------- Encounter Discharge Information Details Patient Name: [name] Date of Service: 05/18/2021 10:00 AM Medical Record Number: [id number] Patient Account Number: [id number] Date of Birth/Sex: 12-04-1934 (86 y.o. F) Treating RN: [name] Primary Care [name]: [name] Other Clinician: Referring [name]: [name] Treating [name]/Extender: [name] in Treatment: 0 Encounter Discharge Information Items Post Procedure Vitals Discharge Condition: Stable Temperature (F): 98.0 Ambulatory Status: Ambulatory Pulse (bpm): 98 Discharge Destination: Home Respiratory Rate (breaths/min): 16 Transportation: Private Auto Blood Pressure (mmHg): 156/75 Accompanied By: self Schedule Follow-up Appointment: Yes Clinical Summary of Care: Electronic Signature(s) Signed: 05/18/2021 11:24:48 AM By: [name] Entered By: [name] on 05/18/2021 11:24:48 [name], [name] ([id number]) -------------------------------------------------------------------------------- Lower Extremity Assessment Details Patient Name: [name] Date of Service: 05/18/2021 10:00 AM Medical Record Number: [id number] Patient Account Number: [id number] Date of Birth/Sex: May 28, 1935 (86 y.o. F) Treating RN: [name] Primary Care  Sharlyn Odonnel: [name] Other Clinician: Referring Therma Lasure: [name] Treating Breyon Blass/Extender: [name] in Treatment: 0 Edema Assessment Assessed: Shirlyn Goltz: Yes] [Right: No] Edema: [Left: N] [Right: o] Calf Left: Right: Point of Measurement: 34 cm From Medial Instep 40.5 cm Ankle Left: Right: Point of Measurement: 9 cm From Medial Instep 24.5 cm Knee To Floor Left: Right: From Medial Instep 46 cm Vascular Assessment Pulses: Dorsalis Pedis Palpable: [Left:Yes] Blood Pressure: Brachial: [Left:122] Ankle: [Left:Dorsalis Pedis: 112 0.92] Electronic Signature(s) Signed: 05/18/2021 4:04:38 PM By: [name] Entered By: [name] on 05/18/2021 10:39:23 Mazzocco, [name] ([id number]) -------------------------------------------------------------------------------- Multi Wound Chart Details Patient Name: [name] Date of Service: 05/18/2021 10:00 AM Medical Record Number: [id number] Patient Account Number: [id number] Date of Birth/Sex: 1934/12/15 (86 y.o. F) Treating RN: [name] Primary Care Nehal Witting: [name] Other Clinician: Referring Shelaine Frie: [name] Treating Esty Ahuja/Extender: [name] in Treatment: 0 Vital Signs Height(in): 65 Pulse(bpm): 108 Weight(lbs): 169 Blood Pressure(mmHg): 156/75 Body Mass Index(BMI): 28 Temperature(F): 98.0 Respiratory Rate(breaths/min): 16 Photos: [N/A:N/A] Wound Location: Left Lower Leg Left Elbow N/A Wounding Event: Skin Tear/Laceration Skin Tear/Laceration N/A Primary Etiology: Trauma, Other Trauma, Other N/A Comorbid History: Coronary Artery Disease, Coronary Artery Disease, N/A Neuropathy Neuropathy Date Acquired: 05/16/2021 05/16/2021 N/A Weeks of Treatment: 0 0 N/A Wound Status: Open Open N/A Measurements L x W x D (cm) 10x3.5x0.1 1.2x0.3x0.1 N/A Area (cm) : 27.489 0.283 N/A Volume (cm) : 2.749 0.028 N/A Classification: Full Thickness Without Exposed Partial  Thickness N/A Support Structures Exudate Amount: Large Medium N/A Exudate Type: Serosanguineous Serosanguineous N/A Exudate Color: red, brown red, brown N/A Granulation Amount: Large (67-100%) Medium (34-66%) N/A Granulation Quality: Red, Pink Red, Pink N/A Necrotic Amount: Small (1-33%) Medium (34-66%) N/A Necrotic Tissue: Eschar, Adherent Blair, Adherent Slough N/A Exposed Structures: Fat Layer (Subcutaneous Tissue): Fascia: No N/A Yes Fat Layer (Subcutaneous Tissue): Fascia: No No Tendon: No Tendon: No Muscle: No Muscle: No Joint: No Joint: No Bone: No Bone: No Limited to Skin Breakdown Debridement: Debridement - Excisional Chemical/Enzymatic/Mechanical - N/A Selective/Open Wound Pre-procedure Verification/Time 10:55 10:55 N/A Out Taken: Pain Control: Lidocaine Lidocaine N/A Tissue Debrided: Subcutaneous N/A N/A Level: Skin/Subcutaneous Tissue N/A N/A Debridement Area (sq cm): 35 N/A N/A Instrument: Forceps, Scissors Forceps, Scissors N/A Bleeding: Moderate Moderate N/A Hemostasis Achieved: Pressure Pressure N/A Debridement Treatment Procedure was tolerated well Procedure was tolerated well N/A Response: Post Debridement 10x3.5x0.1  1.2x0.3x0.1 N/A Measurements L x W x D (cm) [name], [name] ([id number]) Post Debridement Volume: 2.749 0.028 N/A (cm) Procedures Performed: Debridement Debridement N/A Treatment Notes Electronic Signature(s) Signed: 05/18/2021 11:18:02 AM By: Kalman Shan DO Entered By: Kalman Shan on 05/18/2021 11:12:01 Rennaker, [name] ([id number]) -------------------------------------------------------------------------------- [place] Details Patient Name: [name] Date of Service: 05/18/2021 10:00 AM Medical Record Number: [id number] Patient Account Number: [id number] Date of Birth/Sex: 05-29-1935 (86 y.o. F) Treating RN: [name] Primary Care Mussa Groesbeck: [name] Other  Clinician: Referring Maleeyah Mccaughey: [name] Treating Raphaella Larkin/Extender: [name] in Treatment: 0 Active Inactive Orientation to the Wound Care Program Nursing Diagnoses: Knowledge deficit related to the wound healing center program Goals: Patient/caregiver will verbalize understanding of the [facility] Program Date Initiated: 05/18/2021 Target Resolution Date: 06/03/2021 Goal Status: Active Interventions: Provide education on orientation to the wound center Notes: Wound/Skin Impairment Nursing Diagnoses: Impaired tissue integrity Knowledge deficit related to smoking impact on wound healing Knowledge deficit related to ulceration/compromised skin integrity Goals: Patient/caregiver will verbalize understanding of skin care regimen Date Initiated: 05/18/2021 Target Resolution Date: 05/27/2021 Goal Status: Active Ulcer/skin breakdown will have a volume reduction of 30% by week 4 Date Initiated: 05/18/2021 Target Resolution Date: 06/13/2021 Goal Status: Active Ulcer/skin breakdown will have a volume reduction of 50% by week 8 Date Initiated: 05/18/2021 Target Resolution Date: 07/11/2021 Goal Status: Active Ulcer/skin breakdown will have a volume reduction of 80% by week 12 Date Initiated: 05/18/2021 Target Resolution Date: 08/08/2021 Goal Status: Active Ulcer/skin breakdown will heal within 14 weeks Date Initiated: 05/18/2021 Target Resolution Date: 08/22/2021 Goal Status: Active Interventions: Assess patient/caregiver ability to obtain necessary supplies Assess patient/caregiver ability to perform ulcer/skin care regimen upon admission and as needed Assess ulceration(s) every visit Notes: Electronic Signature(s) Signed: 05/18/2021 4:04:38 PM By: [name] Entered By: [name] on 05/18/2021 10:42:41 [name], [name] ([id number]) -------------------------------------------------------------------------------- Pain Assessment Details Patient  Name: [name] Date of Service: 05/18/2021 10:00 AM Medical Record Number: [id number] Patient Account Number: [id number] Date of Birth/Sex: 05/02/1935 (86 y.o. F) Treating RN: [name] Primary Care Paiden Cavell: [name] Other Clinician: Referring Gennell How: [name] Treating Wylene Weissman/Extender: [name] in Treatment: 0 Active Problems Location of Pain Severity and Description of Pain Patient Has Paino Yes Site Locations Pain Location: Generalized Pain, Pain in Ulcers Rate the pain. Current Pain Level: 5 Pain Management and Medication Current Pain Management: Notes c/o left arm pain post fall Electronic Signature(s) Signed: 05/18/2021 4:04:38 PM By: [name] Entered By: [name] on 05/18/2021 10:11:54 [name], [name] ([id number]) -------------------------------------------------------------------------------- Patient/Caregiver Education Details Patient Name: [name] Date of Service: 05/18/2021 10:00 AM Medical Record Number: [id number] Patient Account Number: [id number] Date of Birth/Gender: 22-Jul-1934 (86 y.o. F) Treating RN: [name] Primary Care Physician: [name] Other Clinician: Referring Physician: [name] Treating Physician/Extender: [name] in Treatment: 0 Education Assessment Education Provided To: Patient Education Topics Provided Basic Hygiene: Nutrition: Welcome To [facility]: Wound Debridement: Wound/Skin Impairment: Electronic Signature(s) Signed: 05/18/2021 4:04:38 PM By: [name] Entered By: [name] on 05/18/2021 11:20:53 [name], [name] ([id number]) -------------------------------------------------------------------------------- Wound Assessment Details Patient Name: [name] Date of Service: 05/18/2021 10:00 AM Medical Record Number: [id number] Patient Account Number: [id number] Date of Birth/Sex: 02/09/1935 (86 y.o.  F) Treating RN: [name] Primary Care Tahja Liao: [name] Other Clinician: Referring Marylou Wages: [name] Treating Leiland Mihelich/Extender: [name] in Treatment: 0 Wound Status Wound Number: 1 Primary Etiology: Trauma, Other  Wound Location: Left Lower Leg Wound Status: Open Wounding Event: Skin Tear/Laceration Comorbid History: Coronary Artery Disease, Neuropathy Date Acquired: 05/16/2021 Weeks Of Treatment: 0 Clustered Wound: No Photos Wound Measurements Length: (cm) 10 Width: (cm) 3.5 Depth: (cm) 0.1 Area: (cm) 27.489 Volume: (cm) 2.749 % Reduction in Area: % Reduction in Volume: Tunneling: No Undermining: No Wound Description Classification: Full Thickness Without Exposed Support Structures Exudate Amount: Large Exudate Type: Serosanguineous Exudate Color: red, brown Foul Odor After Cleansing: No Slough/Fibrino Yes Wound Bed Granulation Amount: Large (67-100%) Exposed Structure Granulation Quality: Red, Pink Fascia Exposed: No Necrotic Amount: Small (1-33%) Fat Layer (Subcutaneous Tissue) Exposed: Yes Necrotic Quality: Eschar, Adherent Slough Tendon Exposed: No Muscle Exposed: No Joint Exposed: No Bone Exposed: No Treatment Notes Wound #1 (Lower Leg) Wound Laterality: Left Cleanser Byram Ancillary Kit - 15 Day Supply Discharge Instruction: Use supplies as instructed; Kit contains: (15) Saline Bullets; (15) 3x3 Gauze; 15 pr Gloves Normal Saline Discharge Instruction: Wash your hands with soap and water. Remove old dressing, discard into plastic bag and place into trash. Cleanse the wound with Normal Saline prior to applying a clean dressing using gauze sponges, not tissues or cotton balls. Do not [name], [name] ([id number]) scrub or use excessive force. Pat dry using gauze sponges, not tissue or cotton balls. Soap and Water Discharge Instruction: Gently cleanse wound with antibacterial soap, rinse and pat dry prior to dressing  wounds Peri-Wound Care Topical Primary Dressing Hydrofera Blue Ready Transfer Foam, 4x5 (in/in) Discharge Instruction: Apply Hydrofera Blue Ready to wound bed as directed Secondary Dressing ABD Pad 5x9 (in/in) Discharge Instruction: Cover with ABD pad Secured With 61M [place] Surgical Tape, 2x2 (in/yd) Kerlix Roll Sterile or Non-Sterile 6-ply 4.5x4 (yd/yd) Discharge Instruction: Apply Kerlix as directed Compression Wrap Compression Stockings Add-Ons Electronic Signature(s) Signed: 05/18/2021 4:04:38 PM By: [name] Entered By: [name] on 05/18/2021 10:33:49 Randhawa, [name] ([id number]) -------------------------------------------------------------------------------- Wound Assessment Details Patient Name: [name] Date of Service: 05/18/2021 10:00 AM Medical Record Number: [id number] Patient Account Number: [id number] Date of Birth/Sex: 05-15-1935 (86 y.o. F) Treating RN: [name] Primary Care Kandy Towery: [name] Other Clinician: Referring Taijah Macrae: [name] Treating Tiyonna Sardinha/Extender: [name] in Treatment: 0 Wound Status Wound Number: 2 Primary Etiology: Trauma, Other Wound Location: Left Elbow Wound Status: Open Wounding Event: Skin Tear/Laceration Comorbid History: Coronary Artery Disease, Neuropathy Date Acquired: 05/16/2021 Weeks Of Treatment: 0 Clustered Wound: No Photos Wound Measurements Length: (cm) 1.2 Width: (cm) 0.3 Depth: (cm) 0.1 Area: (cm) 0.283 Volume: (cm) 0.028 % Reduction in Area: % Reduction in Volume: Tunneling: No Undermining: No Wound Description Classification: Partial Thickness Exudate Amount: Medium Exudate Type: Serosanguineous Exudate Color: red, brown Foul Odor After Cleansing: No Slough/Fibrino Yes Wound Bed Granulation Amount: Medium (34-66%) Exposed Structure Granulation Quality: Red, Pink Fascia Exposed: No Necrotic Amount: Medium (34-66%) Fat Layer  (Subcutaneous Tissue) Exposed: No Necrotic Quality: Eschar, Adherent Slough Tendon Exposed: No Muscle Exposed: No Joint Exposed: No Bone Exposed: No Limited to Skin Breakdown Treatment Notes Wound #2 (Elbow) Wound Laterality: Left Cleanser Byram Ancillary Kit - 15 Day Supply Discharge Instruction: Use supplies as instructed; Kit contains: (15) Saline Bullets; (15) 3x3 Gauze; 15 pr Gloves Normal Saline Denzer, Shagun Robbins. ([id number]) Discharge Instruction: Wash your hands with soap and water. Remove old dressing, discard into plastic bag and place into trash. Cleanse the wound with Normal Saline prior to applying a clean dressing using gauze sponges, not tissues or cotton balls. Do not scrub or use  excessive force. Pat dry using gauze sponges, not tissue or cotton balls. Soap and Water Discharge Instruction: Gently cleanse wound with antibacterial soap, rinse and pat dry prior to dressing wounds Peri-Wound Care Topical Primary Dressing Hydrofera Blue Ready Transfer Foam, 2.5x2.5 (in/in) Discharge Instruction: Apply Hydrofera Blue Ready to wound bed as directed Secondary Dressing Victor, 4x4 (in/in) Discharge Instruction: Apply over dressing to secure in place. Secured With Compression Wrap Compression Stockings Environmental education officer) Signed: 05/18/2021 4:04:38 PM By: [name] Entered By: [name] on 05/18/2021 10:35:27 Dinse, [name] ([id number]) -------------------------------------------------------------------------------- Vitals Details Patient Name: [name] Date of Service: 05/18/2021 10:00 AM Medical Record Number: [id number] Patient Account Number: [id number] Date of Birth/Sex: Feb 20, 1935 (86 y.o. F) Treating RN: [name] Primary Care Romayne Ticas: [name] Other Clinician: Referring Lisaanne Lawrie: [name] Treating Coty Larsh/Extender: [name] in Treatment: 0 Vital Signs Time Taken:  10:10 Temperature (F): 98.0 Height (in): 65 Pulse (bpm): 108 Source: Stated Respiratory Rate (breaths/min): 16 Weight (lbs): 169 Blood Pressure (mmHg): 156/75 Source: Measured Reference Range: 80 - 120 mg / dl Body Mass Index (BMI): 28.1 Electronic Signature(s) Signed: 05/18/2021 4:04:38 PM By: [name] Entered ByDonnamarie Poag on 05/18/2021 10:12:34

## 2021-05-24 ENCOUNTER — Inpatient Hospital Stay: Payer: Medicare Other

## 2021-05-24 ENCOUNTER — Ambulatory Visit: Payer: Medicare Other | Admitting: Internal Medicine

## 2021-05-24 ENCOUNTER — Other Ambulatory Visit: Payer: Self-pay

## 2021-05-24 DIAGNOSIS — D708 Other neutropenia: Secondary | ICD-10-CM | POA: Diagnosis not present

## 2021-05-24 LAB — CBC WITH DIFFERENTIAL/PLATELET
Abs Immature Granulocytes: 0.05 10*3/uL (ref 0.00–0.07)
Basophils Absolute: 0 10*3/uL (ref 0.0–0.1)
Basophils Relative: 1 %
Eosinophils Absolute: 0 10*3/uL (ref 0.0–0.5)
Eosinophils Relative: 1 %
HCT: 42.9 % (ref 36.0–46.0)
Hemoglobin: 13.3 g/dL (ref 12.0–15.0)
Immature Granulocytes: 2 %
Lymphocytes Relative: 30 %
Lymphs Abs: 0.8 10*3/uL (ref 0.7–4.0)
MCH: 29 pg (ref 26.0–34.0)
MCHC: 31 g/dL (ref 30.0–36.0)
MCV: 93.7 fL (ref 80.0–100.0)
Monocytes Absolute: 0.3 10*3/uL (ref 0.1–1.0)
Monocytes Relative: 9 %
Neutro Abs: 1.6 10*3/uL — ABNORMAL LOW (ref 1.7–7.7)
Neutrophils Relative %: 57 %
Platelets: 218 10*3/uL (ref 150–400)
RBC: 4.58 MIL/uL (ref 3.87–5.11)
RDW: 15.6 % — ABNORMAL HIGH (ref 11.5–15.5)
WBC: 2.8 10*3/uL — ABNORMAL LOW (ref 4.0–10.5)
nRBC: 0 % (ref 0.0–0.2)

## 2021-05-25 ENCOUNTER — Encounter (HOSPITAL_BASED_OUTPATIENT_CLINIC_OR_DEPARTMENT_OTHER): Payer: Medicare Other | Admitting: Internal Medicine

## 2021-05-25 DIAGNOSIS — S51002A Unspecified open wound of left elbow, initial encounter: Secondary | ICD-10-CM | POA: Diagnosis not present

## 2021-05-25 DIAGNOSIS — L97822 Non-pressure chronic ulcer of other part of left lower leg with fat layer exposed: Secondary | ICD-10-CM

## 2021-05-25 DIAGNOSIS — M316 Other giant cell arteritis: Secondary | ICD-10-CM | POA: Diagnosis not present

## 2021-05-25 DIAGNOSIS — T798XXA Other early complications of trauma, initial encounter: Secondary | ICD-10-CM | POA: Diagnosis not present

## 2021-05-25 DIAGNOSIS — M810 Age-related osteoporosis without current pathological fracture: Secondary | ICD-10-CM | POA: Diagnosis not present

## 2021-05-25 NOTE — Progress Notes (Signed)
SIBONEY, REQUEJO ([id number]) Visit Report for 05/25/2021 Arrival Information Details Patient Name: [name], [name] Date of Service: 05/25/2021 9:15 AM Medical Record Number: [id number] Patient Account Number: [id number] Date of Birth/Sex: [birth year] (86 y.o. F) Treating RN: [name] Primary Care [name]: [name] Other Clinician: Referring [name]: [name] Treating [name]/Extender: [name] in Treatment: 1 Visit Information History Since Last Visit Added or deleted any medications: No Patient Arrived: Ambulatory Had a fall or experienced change in No Arrival Time: 09:15 activities of daily living that may affect Accompanied By: self risk of falls: Transfer Assistance: None Hospitalized since last visit: No Patient Identification Verified: Yes Has Dressing in Place as Prescribed: Yes Secondary Verification Process Completed: Yes Has Compression in Place as Prescribed: Yes Patient Requires Transmission-Based No Pain Present Now: No Precautions: Patient Has Alerts: Yes Patient Alerts: Patient on Blood Thinner 85m Aspirin Electronic Signature(s) Signed: 05/25/2021 1:20:37 PM By: BDonnamarie PoagEntered By: BDonnamarie Poagon 05/25/2021 09:21:44 [name]([id number] -------------------------------------------------------------------------------- Encounter Discharge Information Details Patient Name: [name] Date of Service: 05/25/2021 9:15 AM Medical Record Number: [id number] Account Number: [id number] of Birth/Sex: 3May 01, 1936(86y.o. F) Treating RN: BDonnamarie PoagPrimary Care Cass Vandermeulen: JHarrel LemonOther Clinician: Referring Rehan Holness: JHarrel LemonTreating Sasan Wilkie/Extender: HYaakov Guthriein Treatment: 1 Encounter Discharge Information Items Post Procedure Vitals Discharge Condition: Stable Temperature (F): 98.3 Ambulatory Status: Ambulatory Pulse (bpm): 90 Discharge Destination:  Home Respiratory Rate (breaths/min): 16 Transportation: Private Auto Blood Pressure (mmHg): 191/83 Accompanied By: self Schedule Follow-up Appointment: Yes Clinical Summary of Care: Notes stated she just took her BP med before leaving the house Electronic Signature(s) Signed: 05/25/2021 1:20:37 PM By: BDonnamarie PoagEntered By: BDonnamarie Poagon 05/25/2021 10:08:29 [name]([id number] -------------------------------------------------------------------------------- Lower Extremity Assessment Details Patient Name: [name] Date of Service: 05/25/2021 9:15 AM Medical Record Number: [id number] Account Number: [id number] of Birth/Sex: 3April 19, 1936(86y.o. F) Treating RN: BDonnamarie PoagPrimary Care Davionne Dowty: JHarrel LemonOther Clinician: Referring Aliya Sol: JHarrel LemonTreating Shaman Muscarella/Extender: HYaakov Guthriein Treatment: 1 Edema Assessment Assessed: [Left: Yes] [Right: No] [Left: Edema] [Right: :] Calf Left: Right: Point of Measurement: 34 cm From Medial Instep 40.5 cm Ankle Left: Right: Point of Measurement: 9 cm From Medial Instep 24.5 cm Knee To Floor Left: Right: From Medial Instep 46 cm Vascular Assessment Pulses: Dorsalis Pedis Palpable: [Left:Yes] Electronic Signature(s) Signed: 05/25/2021 1:20:37 PM By: BDonnamarie PoagEntered By: BDonnamarie Poagon 05/25/2021 09:38:12 [name]([id number] -------------------------------------------------------------------------------- Multi Wound Chart Details Patient Name: [name] Date of Service: 05/25/2021 9:15 AM Medical Record Number: [id number] Account Number: [id number] of Birth/Sex: 301/27/1936(86y.o. F) Treating RN: BDonnamarie PoagPrimary Care Naydene Kamrowski: JHarrel LemonOther Clinician: Referring Raeford Brandenburg: JHarrel LemonTreating Jazmarie Biever/Extender: HYaakov Guthriein Treatment: 1 Vital Signs Height(in): 65 Pulse(bpm): 90 Weight(lbs):  169 Blood Pressure(mmHg): 191/83 Body Mass Index(BMI): 28 Temperature(F): 98.3 Respiratory Rate(breaths/min): 16 Photos: [N/A:N/A] Wound Location: Left Lower Leg Left Elbow N/A Wounding Event: Skin Tear/Laceration Skin Tear/Laceration N/A Primary Etiology: Trauma, Other Trauma, Other N/A Comorbid History: Coronary Artery Disease, Coronary Artery Disease, N/A Neuropathy Neuropathy Date Acquired: 05/16/2021 05/16/2021 N/A Weeks of Treatment: 1 1 N/A Wound Status: Open Open N/A Measurements L x W x D (cm) 9.4x5x0.1 0.5x0.5x0.1 N/A Area (cm) : 36.914 0.196 N/A Volume (cm) : 3.691 0.02 N/A % Reduction in Area: -34.30% 30.70% N/A % Reduction in Volume: -34.30% 28.60% N/A Classification: Full Thickness Without Exposed Partial Thickness N/A  Support Structures Exudate Amount: Large Medium N/A Exudate Type: Serosanguineous Serosanguineous N/A Exudate Color: red, brown red, brown N/A Granulation Amount: Small (1-33%) None Present (0%) N/A Granulation Quality: Red, Pink N/A N/A Necrotic Amount: Large (67-100%) Large (67-100%) N/A Necrotic Tissue: Eschar, Adherent [place] N/A Exposed Structures: Fat Layer (Subcutaneous Tissue): Fascia: No N/A Yes Fat Layer (Subcutaneous Tissue): Fascia: No No Tendon: No Tendon: No Muscle: No Muscle: No Joint: No Joint: No Bone: No Bone: No Limited to Skin Breakdown Treatment Notes Electronic Signature(s) Signed: 05/25/2021 1:20:37 PM By: [name] Entered By: [name] on 05/25/2021 09:38:36 [name] ([id number]) -------------------------------------------------------------------------------- Reading Details Patient Name: Tonya Fail T. Date of Service: 05/25/2021 9:15 AM Medical Record Number: [id number] Patient Account Number: [id number] Date of Birth/Sex: 06-17-1934 (86 y.o. F) Treating RN: [name] Primary Care Wynne Rozak: [name] Other Clinician: Referring  Kendrick Remigio: [name] Treating Callahan Peddie/Extender: [name] in Treatment: 1 Active Inactive Wound/Skin Impairment Nursing Diagnoses: Impaired tissue integrity Knowledge deficit related to smoking impact on wound healing Knowledge deficit related to ulceration/compromised skin integrity Goals: Patient/caregiver will verbalize understanding of skin care regimen Date Initiated: 05/18/2021 Date Inactivated: 05/25/2021 Target Resolution Date: 05/27/2021 Goal Status: Met Ulcer/skin breakdown will have a volume reduction of 30% by week 4 Date Initiated: 05/18/2021 Target Resolution Date: 06/13/2021 Goal Status: Active Ulcer/skin breakdown will have a volume reduction of 50% by week 8 Date Initiated: 05/18/2021 Target Resolution Date: 07/11/2021 Goal Status: Active Ulcer/skin breakdown will have a volume reduction of 80% by week 12 Date Initiated: 05/18/2021 Target Resolution Date: 08/08/2021 Goal Status: Active Ulcer/skin breakdown will heal within 14 weeks Date Initiated: 05/18/2021 Target Resolution Date: 08/22/2021 Goal Status: Active Interventions: Assess patient/caregiver ability to obtain necessary supplies Assess patient/caregiver ability to perform ulcer/skin care regimen upon admission and as needed Assess ulceration(s) every visit Notes: Electronic Signature(s) Signed: 05/25/2021 1:20:37 PM By: [name] Entered By: [name] on 05/25/2021 09:38:25 Fodor, Arlyce Robbins ([id number]) -------------------------------------------------------------------------------- Pain Assessment Details Patient Name: Tonya Fail T. Date of Service: 05/25/2021 9:15 AM Medical Record Number: [id number] Patient Account Number: [id number] Date of Birth/Sex: 15-Aug-1934 (86 y.o. F) Treating RN: [name] Primary Care Lyden Redner: [name] Other Clinician: Referring Tajay Muzzy: [name] Treating Eligh Rybacki/Extender: [name] in Treatment: 1 Active  Problems Location of Pain Severity and Description of Pain Patient Has Paino No Site Locations Rate the pain. Current Pain Level: 0 Pain Management and Medication Current Pain Management: Electronic Signature(s) Signed: 05/25/2021 1:20:37 PM By: [name] Entered By: [name] on 05/25/2021 09:22:24 Olmeda, Arlyce Robbins ([id number]) -------------------------------------------------------------------------------- Patient/Caregiver Education Details Patient Name: Tonya Fail T. Date of Service: 05/25/2021 9:15 AM Medical Record Number: [id number] Patient Account Number: [id number] Date of Birth/Gender: 02-Jul-1934 (86 y.o. F) Treating RN: [name] Primary Care Physician: [name] Other Clinician: Referring Physician: Harrel Robbins Treating Physician/Extender: [name] in Treatment: 1 Education Assessment Education Provided To: Patient Education Topics Provided Basic Hygiene: Wound Debridement: Wound/Skin Impairment: Electronic Signature(s) Signed: 05/25/2021 1:20:37 PM By: [name] Entered By: [name] on 05/25/2021 10:07:19 Salak, Arlyce Robbins ([id number]) -------------------------------------------------------------------------------- Wound Assessment Details Patient Name: Tonya Fail T. Date of Service: 05/25/2021 9:15 AM Medical Record Number: [id number] Patient Account Number: [id number] Date of Birth/Sex: 1935-02-21 (86 y.o. F) Treating RN: [name] Primary Care Kynsie Falkner: [name] Other Clinician: Referring Gabryella Murfin: [name] Treating Floella Ensz/Extender: [name] in Treatment: 1 Wound Status Wound Number: 1 Primary Etiology: Trauma, Other Wound Location: Left Lower Leg Wound Status:  Open Wounding Event: Skin Tear/Laceration Comorbid History: Coronary Artery Disease, Neuropathy Date Acquired: 05/16/2021 Weeks Of Treatment: 1 Clustered Wound: No Photos Wound Measurements Length: (cm)  9.4 Width: (cm) 5 Depth: (cm) 0.1 Area: (cm) 36.914 Volume: (cm) 3.691 % Reduction in Area: -34.3% % Reduction in Volume: -34.3% Tunneling: No Undermining: No Wound Description Classification: Full Thickness Without Exposed Support Structures Exudate Amount: Large Exudate Type: Serosanguineous Exudate Color: red, brown Foul Odor After Cleansing: No Slough/Fibrino Yes Wound Bed Granulation Amount: Small (1-33%) Exposed Structure Granulation Quality: Red, Pink Fascia Exposed: No Necrotic Amount: Large (67-100%) Fat Layer (Subcutaneous Tissue) Exposed: Yes Necrotic Quality: Eschar, Adherent Slough Tendon Exposed: No Muscle Exposed: No Joint Exposed: No Bone Exposed: No Treatment Notes Wound #1 (Lower Leg) Wound Laterality: Left Cleanser Normal Saline Discharge Instruction: Wash your hands with soap and water. Remove old dressing, discard into plastic bag and place into trash. Cleanse the wound with Normal Saline prior to applying a clean dressing using gauze sponges, not tissues or cotton balls. Do not scrub or use excessive force. Pat dry using gauze sponges, not tissue or cotton balls. Soap and [address] MESHELLE, HOLNESS ([id number]) Discharge Instruction: Gently cleanse wound with antibacterial soap, rinse and pat dry prior to dressing wounds Peri-Wound Care Topical Primary Dressing Hydrofera Blue Ready Transfer Foam, 4x5 (in/in) Discharge Instruction: Apply Hydrofera Blue Ready to wound bed as directed Secondary Dressing ABD Pad 5x9 (in/in) Discharge Instruction: Cover with ABD pad Secured With 72M New [employer] Surgical Tape, 2x2 (in/yd) Kerlix Roll Sterile or Non-Sterile 6-ply 4.5x4 (yd/yd) Discharge Instruction: Apply Kerlix as directed Compression Wrap Compression Stockings Add-Ons Electronic Signature(s) Signed: 05/25/2021 1:20:37 PM By: [name] Entered By: [name] on 05/25/2021 09:36:34 Drohan, Skarleth T.  ([id number]) -------------------------------------------------------------------------------- Wound Assessment Details Patient Name: Tonya Fail T. Date of Service: 05/25/2021 9:15 AM Medical Record Number: [id number] Patient Account Number: [id number] Date of Birth/Sex: 1934/06/19 (86 y.o. F) Treating RN: [name] Primary Care Aristides Luckey: [name] Other Clinician: Referring Aalyah Mansouri: [name] Treating Penelope Fittro/Extender: [name] in Treatment: 1 Wound Status Wound Number: 2 Primary Etiology: Trauma, Other Wound Location: Left Elbow Wound Status: Open Wounding Event: Skin Tear/Laceration Comorbid History: Coronary Artery Disease, Neuropathy Date Acquired: 05/16/2021 Weeks Of Treatment: 1 Clustered Wound: No Photos Wound Measurements Length: (cm) 0.5 Width: (cm) 0.5 Depth: (cm) 0.1 Area: (cm) 0.196 Volume: (cm) 0.02 % Reduction in Area: 30.7% % Reduction in Volume: 28.6% Tunneling: No Undermining: No Wound Description Classification: Partial Thickness Exudate Amount: Medium Exudate Type: Serosanguineous Exudate Color: red, brown Foul Odor After Cleansing: No Slough/Fibrino Yes Wound Bed Granulation Amount: None Present (0%) Exposed Structure Necrotic Amount: Large (67-100%) Fascia Exposed: No Necrotic Quality: Eschar, Adherent Slough Fat Layer (Subcutaneous Tissue) Exposed: No Tendon Exposed: No Muscle Exposed: No Joint Exposed: No Bone Exposed: No Limited to Skin Breakdown Treatment Notes Wound #2 (Elbow) Wound Laterality: Left Cleanser Soap and Water Discharge Instruction: Gently cleanse wound with antibacterial soap, rinse and pat dry prior to dressing wounds Peri-Wound Care ASHAKI, FROSCH ([id number]) Topical Primary Dressing Hydrofera Blue Ready Transfer Foam, 2.5x2.5 (in/in) Discharge Instruction: Apply Hydrofera Blue Ready to wound bed as directed Secondary Dressing [place], 4x4  (in/in) Discharge Instruction: Apply over dressing to secure in place. Secured With Compression Wrap Compression Stockings Environmental education officer) Signed: 05/25/2021 1:20:37 PM By: [name] Entered By: [name] on 05/25/2021 09:37:22 Keim, Arlyce Robbins ([id number]) -------------------------------------------------------------------------------- Vitals Details Patient Name: Tonya Fail T. Date of Service: 05/25/2021 9:15 AM  Medical Record Number: [id number] Patient Account Number: [id number] Date of Birth/Sex: 03/20/1935 (86 y.o. F) Treating RN: [name] Primary Care Bernette Seeman: [name] Other Clinician: Referring Hermena Swint: [name] Treating Emeri Estill/Extender: [name] in Treatment: 1 Vital Signs Time Taken: 09:20 Temperature (F): 98.3 Height (in): 65 Pulse (bpm): 90 Weight (lbs): 169 Respiratory Rate (breaths/min): 16 Body Mass Index (BMI): 28.1 Blood Pressure (mmHg): 191/83 Reference Range: 80 - 120 mg / dl Electronic Signature(s) Signed: 05/25/2021 1:20:37 PM By: [name] Entered ByDonnamarie Robbins on 05/25/2021 09:22:16

## 2021-05-25 NOTE — Progress Notes (Signed)
[name] ([id number]) Visit Report for 05/25/2021 Chief Complaint Document Details Patient Name: [name], [name] Date of Service: 05/25/2021 9:15 AM Medical Record Number: [id number] Patient Account Number: [id number] Date of Birth/Sex: [birth year] (86 y.o. F) Treating RN: [name] Primary Care Provider: [name] Other Clinician: Referring Provider: [name] Treating Provider/Extender: [name] in Treatment: 1 Information Obtained from: Patient Chief Complaint Left lower extremity and left elbow wounds following a fall Electronic Signature(s) Signed: 05/25/2021 10:35:23 AM By: Kalman Shan DO Entered By: Kalman Shan on 05/25/2021 10:30:57 [name], [name] ([id number]) -------------------------------------------------------------------------------- Debridement Details Patient Name: Tonya Fail T. Date of Service: 05/25/2021 9:15 AM Medical Record Number: [id number] Patient Account Number: [id number] Date of Birth/Sex: 1934-11-20 (86 y.o. F) Treating RN: [name] Primary Care Provider: [name] Other Clinician: Referring Provider: [name] Treating Provider/Extender: [name] in Treatment: 1 Debridement Performed for Wound #1 Left Lower Leg Assessment: Performed By: Physician Kalman Shan, MD Debridement Type: Debridement Level of Consciousness (Pre- Awake and Alert procedure): Pre-procedure Verification/Time Out Yes - 09:47 Taken: Start Time: 09:48 Pain Control: Lidocaine Total Area Debrided (L x W): 8 (cm) x 4 (cm) = 32 (cm) Tissue and other material Viable, Non-Viable, Subcutaneous, Skin: Dermis debrided: Level: Skin/Subcutaneous Tissue Debridement Description: Excisional Instrument: Blade, Curette, Forceps, Scissors Bleeding: Moderate Hemostasis Achieved: Pressure Response to Treatment: Procedure was tolerated well Level of Consciousness (Post- Awake and  Alert procedure): Post Debridement Measurements of Total Wound Length: (cm) 9.4 Width: (cm) 5 Depth: (cm) 0.1 Volume: (cm) 3.691 Character of Wound/Ulcer Post Debridement: Improved Post Procedure Diagnosis Same as Pre-procedure Electronic Signature(s) Signed: 05/25/2021 10:35:23 AM By: Kalman Shan DO Signed: 05/25/2021 1:20:37 PM By: [name] Entered By: [name] on 05/25/2021 09:57:29 [name], [name] ([id number]) -------------------------------------------------------------------------------- HPI Details Patient Name: Tonya Fail T. Date of Service: 05/25/2021 9:15 AM Medical Record Number: [id number] Patient Account Number: [id number] Date of Birth/Sex: 10/21/1934 (86 y.o. F) Treating RN: [name] Primary Care Provider: [name] Other Clinician: Referring Provider: [name] Treating Provider/Extender: [name] in Treatment: 1 History of Present Illness HPI Description: Admission 05/18/2021 [name] is an 86 year old female with a past medical history of osteoporosis, temporal arteritis and hypertension that presents to the clinic for a left lower extremity wound and left elbow wound due to a fall. She states she went to the attic to retrieve an item and she fell and injured her left side. She visited dermatology for this issue and was referred to our clinic. She is currently been keeping the area covered. She currently denies signs of infection. She reports mild tenderness to the wound beds. 12/28; patient presents for follow-up. She has been using Hydrofera Blue to the wound beds. She has a scab to the left elbow wound. She has no issues or complaints today. She currently denies signs of infection. Electronic Signature(s) Signed: 05/25/2021 10:35:23 AM By: Kalman Shan DO Entered By: Kalman Shan on 05/25/2021 10:31:39 [name], [name]  ([id number]) -------------------------------------------------------------------------------- Physical Exam Details Patient Name: Tonya Fail T. Date of Service: 05/25/2021 9:15 AM Medical Record Number: [id number] Patient Account Number: [id number] Date of Birth/Sex: Jan 26, 1935 (86 y.o. F) Treating RN: [name] Primary Care Provider: [name] Other Clinician: Referring Provider: [name] Treating Provider/Extender: [name] in Treatment: 1 Constitutional . Cardiovascular . Psychiatric . Notes Left lower extremity: Open wound with nonviable tissue and granulation tissue. No signs of infection. To the left elbow there is a scab to previous wound site. Electronic  Signature(s) Signed: 05/25/2021 10:35:23 AM By: Kalman Shan DO Entered By: Kalman Shan on 05/25/2021 10:32:35 [name], [name] ([id number]) -------------------------------------------------------------------------------- Physician Orders Details Patient Name: Tonya Fail T. Date of Service: 05/25/2021 9:15 AM Medical Record Number: [id number] Patient Account Number: [id number] Date of Birth/Sex: [birth year] (86 y.o. F) Treating RN: [name] Primary Care Provider: [name] Other Clinician: Referring Provider: [name] Treating Provider/Extender: [name] in Treatment: 1 Verbal / Phone Orders: No Diagnosis Coding Follow-up Appointments o Return Appointment in 1 week. o Nurse Visit as needed Bathing/ Shower/ Hygiene o Clean wound with Normal Saline or wound cleanser. o May shower; gently cleanse wound with antibacterial soap, rinse and pat dry prior to dressing wounds o No tub bath. Anesthetic (Use 'Patient Medications' Section for Anesthetic Order Entry) o Lidocaine applied to wound bed Edema Control - Lymphedema / Segmental Compressive Device / Other o Elevate, Exercise Daily and Avoid Standing for Long Periods of  Time. o Elevate leg(s) parallel to the floor when sitting. o DO YOUR BEST to sleep in the bed at night. DO NOT sleep in your recliner. Long hours of sitting in a recliner leads to swelling of the legs and/or potential wounds on your backside. Additional Orders / Instructions o Follow Nutritious Diet and Increase Protein Intake Wound Treatment Wound #1 - Lower Leg Wound Laterality: Left Cleanser: Normal Saline Every Other Day/15 Days Discharge Instructions: Wash your hands with soap and water. Remove old dressing, discard into plastic bag and place into trash. Cleanse the wound with Normal Saline prior to applying a clean dressing using gauze sponges, not tissues or cotton balls. Do not scrub or use excessive force. Pat dry using gauze sponges, not tissue or cotton balls. Cleanser: Soap and Water Every Other Day/15 Days Discharge Instructions: Gently cleanse wound with antibacterial soap, rinse and pat dry prior to dressing wounds Primary Dressing: Hydrofera Blue Ready Transfer Foam, 4x5 (in/in) Every Other Day/15 Days Discharge Instructions: Apply Hydrofera Blue Ready to wound bed as directed Secondary Dressing: ABD Pad 5x9 (in/in) Every Other Day/15 Days Discharge Instructions: Cover with ABD pad Secured With: 75M Medipore H Soft Cloth Surgical Tape, 2x2 (in/yd) Every Other Day/15 Days Secured With: [employer] Sterile or Non-Sterile 6-ply 4.5x4 (yd/yd) Every Other Day/15 Days Discharge Instructions: Apply Kerlix as directed Wound #2 - Elbow Wound Laterality: Left Cleanser: Soap and Water 1 x Per Day Discharge Instructions: Gently cleanse wound with antibacterial soap, rinse and pat dry prior to dressing wounds Primary Dressing: Hydrofera Blue Ready Transfer Foam, 2.5x2.5 (in/in) 1 x Per Day Discharge Instructions: Apply Hydrofera Blue Ready to wound bed as directed Secondary Dressing: [place] Dressing, 4x4 (in/in) 1 x Per Day Discharge Instructions: Apply over dressing  to secure in place. CASSI, JENNE ([id number]) Electronic Signature(s) Signed: 05/25/2021 10:35:23 AM By: Kalman Shan DO Entered By: Kalman Shan on 05/25/2021 10:35:01 [name], [name] ([id number]) -------------------------------------------------------------------------------- Problem List Details Patient Name: Tonya Fail T. Date of Service: 05/25/2021 9:15 AM Medical Record Number: [id number] Patient Account Number: [id number] Date of Birth/Sex: 1934/12/06 (86 y.o. F) Treating RN: [name] Primary Care Provider: [name] Other Clinician: Referring Provider: [name] Treating Provider/Extender: [name] in Treatment: 1 Active Problems ICD-10 Encounter Code Description Active Date MDM Diagnosis [phone] Non-pressure chronic ulcer of other part of left lower leg with fat layer 05/18/2021 No Yes exposed S51.002A Unspecified open wound of left elbow, initial encounter 05/18/2021 No Yes L98.491 Non-pressure chronic ulcer of skin of other sites limited to  breakdown of 05/18/2021 No Yes skin M31.6 Other giant cell arteritis 05/18/2021 No Yes M81.0 Age-related osteoporosis without current pathological fracture 05/18/2021 No Yes T79.8XXD Other early complications of trauma, subsequent encounter 05/18/2021 No Yes Inactive Problems Resolved Problems Electronic Signature(s) Signed: 05/25/2021 10:35:23 AM By: Kalman Shan DO Entered By: Kalman Shan on 05/25/2021 10:30:53 [name], [name] ([id number]) -------------------------------------------------------------------------------- Progress Note Details Patient Name: Tonya Fail T. Date of Service: 05/25/2021 9:15 AM Medical Record Number: [id number] Patient Account Number: [id number] Date of Birth/Sex: [birth year] (86 y.o. F) Treating RN: [name] Primary Care Provider: [name] Other Clinician: Referring Provider: [name] Treating  Provider/Extender: [name] in Treatment: 1 Subjective Chief Complaint Information obtained from Patient Left lower extremity and left elbow wounds following a fall History of Present Illness (HPI) Admission 05/18/2021 Ms. Jackalynn Art is an 86 year old female with a past medical history of osteoporosis, temporal arteritis and hypertension that presents to the clinic for a left lower extremity wound and left elbow wound due to a fall. She states she went to the attic to retrieve an item and she fell and injured her left side. She visited dermatology for this issue and was referred to our clinic. She is currently been keeping the area covered. She currently denies signs of infection. She reports mild tenderness to the wound beds. 12/28; patient presents for follow-up. She has been using Hydrofera Blue to the wound beds. She has a scab to the left elbow wound. She has no issues or complaints today. She currently denies signs of infection. Objective Constitutional Vitals Time Taken: 9:20 AM, Height: 65 in, Weight: 169 lbs, BMI: 28.1, Temperature: 98.3 F, Pulse: 90 bpm, Respiratory Rate: 16 breaths/min, Blood Pressure: 191/83 mmHg. General Notes: Left lower extremity: Open wound with nonviable tissue and granulation tissue. No signs of infection. To the left elbow there is a scab to previous wound site. Integumentary (Hair, Skin) Wound #1 status is Open. Original cause of wound was Skin Tear/Laceration. The date acquired was: 05/16/2021. The wound has been in treatment 1 weeks. The wound is located on the Left Lower Leg. The wound measures 9.4cm length x 5cm width x 0.1cm depth; 36.914cm^2 area and 3.691cm^3 volume. There is Fat Layer (Subcutaneous Tissue) exposed. There is no tunneling or undermining noted. There is a large amount of serosanguineous drainage noted. There is small (1-33%) red, pink granulation within the wound bed. There is a large (67-100%) amount of  necrotic tissue within the wound bed including Eschar and Adherent Slough. Wound #2 status is Open. Original cause of wound was Skin Tear/Laceration. The date acquired was: 05/16/2021. The wound has been in treatment 1 weeks. The wound is located on the Left Elbow. The wound measures 0.5cm length x 0.5cm width x 0.1cm depth; 0.196cm^2 area and 0.02cm^3 volume. The wound is limited to skin breakdown. There is no tunneling or undermining noted. There is a medium amount of serosanguineous drainage noted. There is no granulation within the wound bed. There is a large (67-100%) amount of necrotic tissue within the wound bed including Eschar and Adherent Slough. Assessment Active Problems ICD-10 Non-pressure chronic ulcer of other part of left lower leg with fat layer exposed Unspecified open wound of left elbow, initial encounter Non-pressure chronic ulcer of skin of other sites limited to breakdown of skin Other giant cell arteritis Age-related osteoporosis without current pathological fracture Other early complications of trauma, subsequent encounter [name], Tonya T. ([id number]) Patient's left elbow wound has a scab. Nothing to do at this time  to this area. I debrided nonviable tissue to the left lower extremity wound. No signs of infection. I recommended continuing Hydrofera Blue. Procedures Wound #1 Pre-procedure diagnosis of Wound #1 is a Trauma, Other located on the Left Lower Leg . There was a Excisional Skin/Subcutaneous Tissue Debridement with a total area of 32 sq cm performed by Kalman Shan, MD. With the following instrument(s): Blade, Curette, Forceps, and Scissors to remove Viable and Non-Viable tissue/material. Material removed includes Subcutaneous Tissue and Skin: Dermis and after achieving pain control using Lidocaine. A time out was conducted at 09:47, prior to the start of the procedure. A Moderate amount of bleeding was controlled with Pressure. The procedure was  tolerated well. Post Debridement Measurements: 9.4cm length x 5cm width x 0.1cm depth; 3.691cm^3 volume. Character of Wound/Ulcer Post Debridement is improved. Post procedure Diagnosis Wound #1: Same as Pre-Procedure Plan Follow-up Appointments: Return Appointment in 1 week. Nurse Visit as needed Bathing/ Shower/ Hygiene: Clean wound with Normal Saline or wound cleanser. May shower; gently cleanse wound with antibacterial soap, rinse and pat dry prior to dressing wounds No tub bath. Anesthetic (Use 'Patient Medications' Section for Anesthetic Order Entry): Lidocaine applied to wound bed Edema Control - Lymphedema / Segmental Compressive Device / Other: Elevate, Exercise Daily and Avoid Standing for Long Periods of Time. Elevate leg(s) parallel to the floor when sitting. DO YOUR BEST to sleep in the bed at night. DO NOT sleep in your recliner. Long hours of sitting in a recliner leads to swelling of the legs and/or potential wounds on your backside. Additional Orders / Instructions: Follow Nutritious Diet and Increase Protein Intake WOUND #1: - Lower Leg Wound Laterality: Left Cleanser: Normal Saline Every Other Day/15 Days Discharge Instructions: Wash your hands with soap and water. Remove old dressing, discard into plastic bag and place into trash. Cleanse the wound with Normal Saline prior to applying a clean dressing using gauze sponges, not tissues or cotton balls. Do not scrub or use excessive force. Pat dry using gauze sponges, not tissue or cotton balls. Cleanser: Soap and Water Every Other Day/15 Days Discharge Instructions: Gently cleanse wound with antibacterial soap, rinse and pat dry prior to dressing wounds Primary Dressing: Hydrofera Blue Ready Transfer Foam, 4x5 (in/in) Every Other Day/15 Days Discharge Instructions: Apply Hydrofera Blue Ready to wound bed as directed Secondary Dressing: ABD Pad 5x9 (in/in) Every Other Day/15 Days Discharge Instructions: Cover with ABD  pad Secured With: 78M Medipore H Soft Cloth Surgical Tape, 2x2 (in/yd) Every Other Day/15 Days Secured With: [employer] Sterile or Non-Sterile 6-ply 4.5x4 (yd/yd) Every Other Day/15 Days Discharge Instructions: Apply Kerlix as directed WOUND #2: - Elbow Wound Laterality: Left Cleanser: Soap and Water 1 x Per Day/ Discharge Instructions: Gently cleanse wound with antibacterial soap, rinse and pat dry prior to dressing wounds Primary Dressing: Hydrofera Blue Ready Transfer Foam, 2.5x2.5 (in/in) 1 x Per Day/ Discharge Instructions: Apply Hydrofera Blue Ready to wound bed as directed Secondary Dressing: [place] Dressing, 4x4 (in/in) 1 x Per Day/ Discharge Instructions: Apply over dressing to secure in place. 1. In office sharp debridement 2. Hydrofera Blue 3. Follow-up in 1 week Electronic Signature(s) Signed: 05/25/2021 10:35:23 AM By: Kalman Shan DO Entered By: Kalman Shan on 05/25/2021 10:34:24 [name], [name] ([id number]) [name], [name] ([id number]) -------------------------------------------------------------------------------- SuperBill Details Patient Name: Tonya Fail T. Date of Service: 05/25/2021 Medical Record Number: [id number] Patient Account Number: [id number] Date of Birth/Sex: 04-Apr-1935 (86 y.o. F) Treating RN: [name] Primary Care Provider: Edwina Barth,  John Other Clinician: Referring Provider: [name] Treating Provider/Extender: [name] in Treatment: 1 Diagnosis Coding ICD-10 Codes Code Description [phone] Non-pressure chronic ulcer of other part of left lower leg with fat layer exposed S51.002A Unspecified open wound of left elbow, initial encounter L98.491 Non-pressure chronic ulcer of skin of other sites limited to breakdown of skin M31.6 Other giant cell arteritis M81.0 Age-related osteoporosis without current pathological fracture T79.8XXD Other early complications of trauma, subsequent  encounter Facility Procedures CPT4 Code: [id number] Description: 62263 - DEB SUBQ TISSUE 20 SQ CM/< Modifier: Quantity: 1 CPT4 Code: Description: ICD-10 Diagnosis Description L97.822 Non-pressure chronic ulcer of other part of left lower leg with fat layer Modifier: exposed Quantity: CPT4 Code: [id number] Description: 63893 - DEB SUBQ TISS EA ADDL 20CM Modifier: Quantity: 1 CPT4 Code: Description: ICD-10 Diagnosis Description L97.822 Non-pressure chronic ulcer of other part of left lower leg with fat layer Modifier: exposed Quantity: Physician Procedures CPT4 Code: 7342876 Description: 11042 - WC PHYS SUBQ TISS 20 SQ CM Modifier: Quantity: 1 CPT4 Code: Description: ICD-10 Diagnosis Description L97.822 Non-pressure chronic ulcer of other part of left lower leg with fat layer Modifier: exposed Quantity: CPT4 Code: [id number] Description: 11045 - WC PHYS SUBQ TISS EA ADDL 20 CM Modifier: Quantity: 1 CPT4 Code: Description: ICD-10 Diagnosis Description L97.822 Non-pressure chronic ulcer of other part of left lower leg with fat layer Modifier: exposed Quantity: Electronic Signature(s) Signed: 05/25/2021 10:35:23 AM By: Kalman Shan DO Entered By: Kalman Shan on 05/25/2021 10:34:35

## 2021-05-29 ENCOUNTER — Encounter: Payer: Self-pay | Admitting: Internal Medicine

## 2021-05-30 ENCOUNTER — Encounter: Payer: Self-pay | Admitting: Internal Medicine

## 2021-05-31 ENCOUNTER — Inpatient Hospital Stay: Payer: Medicare Other

## 2021-05-31 ENCOUNTER — Other Ambulatory Visit: Payer: Self-pay

## 2021-05-31 ENCOUNTER — Inpatient Hospital Stay: Payer: Medicare Other | Attending: Internal Medicine

## 2021-05-31 DIAGNOSIS — Z79899 Other long term (current) drug therapy: Secondary | ICD-10-CM | POA: Diagnosis not present

## 2021-05-31 DIAGNOSIS — D708 Other neutropenia: Secondary | ICD-10-CM

## 2021-05-31 DIAGNOSIS — D72819 Decreased white blood cell count, unspecified: Secondary | ICD-10-CM

## 2021-05-31 LAB — CBC WITH DIFFERENTIAL/PLATELET
Abs Immature Granulocytes: 0.02 10*3/uL (ref 0.00–0.07)
Basophils Absolute: 0 10*3/uL (ref 0.0–0.1)
Basophils Relative: 1 %
Eosinophils Absolute: 0 10*3/uL (ref 0.0–0.5)
Eosinophils Relative: 2 %
HCT: 43 % (ref 36.0–46.0)
Hemoglobin: 13.3 g/dL (ref 12.0–15.0)
Immature Granulocytes: 1 %
Lymphocytes Relative: 46 %
Lymphs Abs: 1.2 10*3/uL (ref 0.7–4.0)
MCH: 28.9 pg (ref 26.0–34.0)
MCHC: 30.9 g/dL (ref 30.0–36.0)
MCV: 93.3 fL (ref 80.0–100.0)
Monocytes Absolute: 0.3 10*3/uL (ref 0.1–1.0)
Monocytes Relative: 10 %
Neutro Abs: 1 10*3/uL — ABNORMAL LOW (ref 1.7–7.7)
Neutrophils Relative %: 40 %
Platelets: 243 10*3/uL (ref 150–400)
RBC: 4.61 MIL/uL (ref 3.87–5.11)
RDW: 15.4 % (ref 11.5–15.5)
WBC: 2.6 10*3/uL — ABNORMAL LOW (ref 4.0–10.5)
nRBC: 0 % (ref 0.0–0.2)

## 2021-05-31 MED ORDER — FILGRASTIM-SNDZ 480 MCG/0.8ML IJ SOSY
480.0000 ug | PREFILLED_SYRINGE | Freq: Once | INTRAMUSCULAR | Status: AC
  Administered 2021-05-31: 480 ug via SUBCUTANEOUS
  Filled 2021-05-31: qty 0.8

## 2021-06-01 ENCOUNTER — Encounter: Payer: Medicare Other | Attending: Internal Medicine | Admitting: Internal Medicine

## 2021-06-01 ENCOUNTER — Encounter: Payer: Self-pay | Admitting: Internal Medicine

## 2021-06-01 DIAGNOSIS — T798XXA Other early complications of trauma, initial encounter: Secondary | ICD-10-CM | POA: Insufficient documentation

## 2021-06-01 DIAGNOSIS — L98491 Non-pressure chronic ulcer of skin of other sites limited to breakdown of skin: Secondary | ICD-10-CM | POA: Diagnosis not present

## 2021-06-01 DIAGNOSIS — I10 Essential (primary) hypertension: Secondary | ICD-10-CM | POA: Diagnosis not present

## 2021-06-01 DIAGNOSIS — L03116 Cellulitis of left lower limb: Secondary | ICD-10-CM | POA: Insufficient documentation

## 2021-06-01 DIAGNOSIS — M316 Other giant cell arteritis: Secondary | ICD-10-CM | POA: Insufficient documentation

## 2021-06-01 DIAGNOSIS — T798XXD Other early complications of trauma, subsequent encounter: Secondary | ICD-10-CM

## 2021-06-01 DIAGNOSIS — M810 Age-related osteoporosis without current pathological fracture: Secondary | ICD-10-CM | POA: Diagnosis not present

## 2021-06-01 DIAGNOSIS — X58XXXA Exposure to other specified factors, initial encounter: Secondary | ICD-10-CM | POA: Insufficient documentation

## 2021-06-01 DIAGNOSIS — L97822 Non-pressure chronic ulcer of other part of left lower leg with fat layer exposed: Secondary | ICD-10-CM | POA: Diagnosis not present

## 2021-06-01 NOTE — Progress Notes (Signed)
ALVETA, QUINTELA ([id number]) Visit Report for 06/01/2021 Chief Complaint Document Details Patient Name: [name], [name] Date of Service: 06/01/2021 1:00 PM Medical Record Number: [id number] Patient Account Number: [id number] Date of Birth/Sex: 10/06/1934 (86 y.o. F) Treating RN: [name] Primary Care Provider: [name] Other Clinician: Referring Provider: [name] Treating Provider/Extender: [name] in Treatment: 2 Information Obtained from: Patient Chief Complaint Left lower extremity and left elbow wounds following a fall Electronic Signature(s) Signed: 06/01/2021 1:34:26 PM By: Kalman Shan DO Entered By: Kalman Shan on 06/01/2021 13:28:15 Brockman, [name] ([id number]) -------------------------------------------------------------------------------- HPI Details Patient Name: Tonya Fail T. Date of Service: 06/01/2021 1:00 PM Medical Record Number: [id number] Patient Account Number: [id number] Date of Birth/Sex: 06/06/1934 (86 y.o. F) Treating RN: [name] Primary Care Provider: [name] Other Clinician: Referring Provider: [name] Treating Provider/Extender: [name] in Treatment: 2 History of Present Illness HPI Description: Admission 05/18/2021 [name] is an 87 year old female with a past medical history of osteoporosis, temporal arteritis and hypertension that presents to the clinic for a left lower extremity wound and left elbow wound due to a fall. She states she went to the attic to retrieve an item and she fell and injured her left side. She visited dermatology for this issue and was referred to our clinic. She is currently been keeping the area covered. She currently denies signs of infection. She reports mild tenderness to the wound beds. 12/28; patient presents for follow-up. She has been using Hydrofera Blue to the wound beds. She has a scab to the left elbow wound. She has  no issues or complaints today. She currently denies signs of infection. 1/4; patient presents for follow-up. She has been using Hydrofera blue with dressing changes. She reports more pain to the wound bed in the past week. She denies purulent drainage. Electronic Signature(s) Signed: 06/01/2021 1:34:26 PM By: Kalman Shan DO Entered By: Kalman Shan on 06/01/2021 13:28:53 Corniel, [name] ([id number]) -------------------------------------------------------------------------------- Physical Exam Details Patient Name: Tonya Fail T. Date of Service: 06/01/2021 1:00 PM Medical Record Number: [id number] Patient Account Number: [id number] Date of Birth/Sex: July 24, 1934 (86 y.o. F) Treating RN: [name] Primary Care Provider: [name] Other Clinician: Referring Provider: [name] Treating Provider/Extender: [name] in Treatment: 2 Constitutional . Cardiovascular . Psychiatric . Notes Left lower extremity: Open wound with nonviable tissue and mostly granulation tissue present. Slight increase in erythema to the periwound. Tenderness to the wound bed. 2+ pitting edema to the shin. No calf tenderness. Electronic Signature(s) Signed: 06/01/2021 1:34:26 PM By: Kalman Shan DO Entered By: Kalman Shan on 06/01/2021 13:29:49 Creer, [name] ([id number]) -------------------------------------------------------------------------------- Physician Orders Details Patient Name: Tonya Fail T. Date of Service: 06/01/2021 1:00 PM Medical Record Number: [id number] Patient Account Number: [id number] Date of Birth/Sex: 17-Nov-1934 (86 y.o. F) Treating RN: [name] Primary Care Provider: [name] Other Clinician: Referring Provider: [name] Treating Provider/Extender: [name] in Treatment: 2 Verbal / Phone Orders: No Diagnosis Coding Follow-up Appointments o Return Appointment in 1 week. o Nurse Visit as  needed Bathing/ Shower/ Hygiene o Clean wound with Normal Saline or wound cleanser. o May shower; gently cleanse wound with antibacterial soap, rinse and pat dry prior to dressing wounds o No tub bath. Anesthetic (Use 'Patient Medications' Section for Anesthetic Order Entry) o Lidocaine applied to wound bed Edema Control - Lymphedema / Segmental Compressive Device / Other o Tubigrip single layer applied. - until you start your own compression hose at home-tubi D   o Elevate, Exercise Daily and Avoid Standing for Long Periods of Time. o Elevate leg(s) parallel to the floor when sitting. o DO YOUR BEST to sleep in the bed at night. DO NOT sleep in your recliner. Long hours of sitting in a recliner leads to swelling of the legs and/or potential wounds on your backside. Additional Orders / Instructions o Follow Nutritious Diet and Increase Protein Intake Medications-Please add to medication list. o P.O. Antibiotics - Pick up at pharmacy and start today for your leg Wound Treatment Wound #1 - Lower Leg Wound Laterality: Left Cleanser: Normal Saline Every Other Day/15 Days Discharge Instructions: Wash your hands with soap and water. Remove old dressing, discard into plastic bag and place into trash. Cleanse the wound with Normal Saline prior to applying a clean dressing using gauze sponges, not tissues or cotton balls. Do not scrub or use excessive force. Pat dry using gauze sponges, not tissue or cotton balls. Cleanser: Soap and Water Every Other Day/15 Days Discharge Instructions: Gently cleanse wound with antibacterial soap, rinse and pat dry prior to dressing wounds Topical: Gentamicin Every Other Day/15 Days Discharge Instructions: In office-Apply as directed by provider. Primary Dressing: Hydrofera Blue Ready Transfer Foam, 4x5 (in/in) Every Other Day/15 Days Discharge Instructions: Apply Hydrofera Blue Ready to wound bed as directed Secondary Dressing: Mepilex Border  Flex, 6x6 (in/in) (DME) (Generic) Every Other Day/15 Days Discharge Instructions: Apply to wound as directed. Do not cut. Patient Medications Allergies: No Known Allergies Notifications Medication Indication Start End Keflex 06/01/2021 DOSE 1 - oral 500 mg capsule - 1 capsule oral q6h x 7 days [name], SLAPE ([id number]) Electronic Signature(s) Signed: 06/01/2021 1:34:26 PM By: Kalman Shan DO Previous Signature: 06/01/2021 1:30:47 PM Version By: Kalman Shan DO Entered By: Kalman Shan on 06/01/2021 13:34:03 Tri, [name] ([id number]) -------------------------------------------------------------------------------- Problem List Details Patient Name: Tonya Fail T. Date of Service: 06/01/2021 1:00 PM Medical Record Number: [id number] Patient Account Number: [id number] Date of Birth/Sex: [birth year] (86 y.o. 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/18/2021 No Yes exposed S51.002A Unspecified open wound of left elbow, initial encounter 05/18/2021 No Yes L98.491 Non-pressure chronic ulcer of skin of other sites limited to breakdown of 05/18/2021 No Yes skin M31.6 Other giant cell arteritis 05/18/2021 No Yes M81.0 Age-related osteoporosis without current pathological fracture 05/18/2021 No Yes T79.8XXD Other early complications of trauma, subsequent encounter 05/18/2021 No Yes L03.116 Cellulitis of left lower limb 06/01/2021 No Yes Inactive Problems Resolved Problems Electronic Signature(s) Signed: 06/01/2021 1:34:26 PM By: Kalman Shan DO Entered By: Kalman Shan on 06/01/2021 13:28:06 Schartz, [name]  ([id number]) -------------------------------------------------------------------------------- Progress Note Details Patient Name: Tonya Fail T. Date of Service: 06/01/2021 1:00 PM Medical Record Number: [id number] Patient Account Number: [id number] Date of Birth/Sex: 1935/01/18 (86 y.o. F) Treating RN: [name] Primary Care Provider: [name] Other Clinician: Referring Provider: [name] Treating Provider/Extender: [name] in Treatment: 2 Subjective Chief Complaint Information obtained from Patient Left lower extremity and left elbow wounds following a fall History of Present Illness (HPI) Admission 05/18/2021 [name] is an 87 year old female with a past medical history of osteoporosis, temporal arteritis and hypertension that presents to the clinic for a left lower extremity wound and left elbow wound due to a fall. She  states she went to the attic to retrieve an item and she fell and injured her left side. She visited dermatology for this issue and was referred to our clinic. She is currently been keeping the area covered. She currently denies signs of infection. She reports mild tenderness to the wound beds. 12/28; patient presents for follow-up. She has been using Hydrofera Blue to the wound beds. She has a scab to the left elbow wound. She has no issues or complaints today. She currently denies signs of infection. 1/4; patient presents for follow-up. She has been using Hydrofera blue with dressing changes. She reports more pain to the wound bed in the past week. She denies purulent drainage. Objective Constitutional Vitals Time Taken: 1:10 PM, Height: 65 in, Weight: 169 lbs, BMI: 28.1, Temperature: 98.3 F, Pulse: 104 bpm, Respiratory Rate: 16 breaths/min, Blood Pressure: 154/57 mmHg. General Notes: Left lower extremity: Open wound with nonviable tissue and mostly granulation tissue present. Slight increase in erythema to  the periwound. Tenderness to the wound bed. 2+ pitting edema to the shin. No calf tenderness. Integumentary (Hair, Skin) Wound #1 status is Open. Original cause of wound was Skin Tear/Laceration. The date acquired was: 05/16/2021. The wound has been in treatment 2 weeks. The wound is located on the Left Lower Leg. The wound measures 9.1cm length x 4.8cm width x 0.1cm depth; 34.306cm^2 area and 3.431cm^3 volume. There is Fat Layer (Subcutaneous Tissue) exposed. There is no tunneling or undermining noted. There is a large amount of serosanguineous drainage noted. There is large (67-100%) red, pink granulation within the wound bed. There is a small (1-33%) amount of necrotic tissue within the wound bed including Adherent Slough. Wound #2 status is Healed - Epithelialized. Original cause of wound was Skin Tear/Laceration. The date acquired was: 05/16/2021. The wound has been in treatment 2 weeks. The wound is located on the Left Elbow. The wound measures 0cm length x 0cm width x 0cm depth; 0cm^2 area and 0cm^3 volume. The wound is limited to skin breakdown. There is no tunneling or undermining noted. There is a none present amount of drainage noted. There is no granulation within the wound bed. There is no necrotic tissue within the wound bed. Assessment Active Problems ICD-10 Non-pressure chronic ulcer of other part of left lower leg with fat layer exposed Unspecified open wound of left elbow, initial encounter Non-pressure chronic ulcer of skin of other sites limited to breakdown of skin Other giant cell arteritis Age-related osteoporosis without current pathological fracture Other early complications of trauma, subsequent encounter Brasher, Michaelyn T. ([id number]) Cellulitis of left lower limb Patient's wound has shown improvement in appearance since last clinic visit. There is less nonviable tissue present. She does report more pain to the wound bed and there is increased erythema to the  periwound. No drainage noted. At this time I will treat for a wound infection with Keflex. I recommended continuing Hydrofera Blue. Follow-up in 1 week. Plan Follow-up Appointments: Return Appointment in 1 week. Nurse Visit as needed Bathing/ Shower/ Hygiene: Clean wound with Normal Saline or wound cleanser. May shower; gently cleanse wound with antibacterial soap, rinse and pat dry prior to dressing wounds No tub bath. Anesthetic (Use 'Patient Medications' Section for Anesthetic Order Entry): Lidocaine applied to wound bed Edema Control - Lymphedema / Segmental Compressive Device / Other: Tubigrip single layer applied. - until you start your own compression hose at home-tubi D Elevate, Exercise Daily and Avoid Standing for Long Periods of Time. Elevate leg(s) parallel to the  floor when sitting. DO YOUR BEST to sleep in the bed at night. DO NOT sleep in your recliner. Long hours of sitting in a recliner leads to swelling of the legs and/or potential wounds on your backside. Additional Orders / Instructions: Follow Nutritious Diet and Increase Protein Intake Medications-Please add to medication list.: P.O. Antibiotics - Pick up at pharmacy and start today for your leg The following medication(s) was prescribed: Keflex oral 500 mg capsule 1 1 capsule oral q6h x 7 days starting 06/01/2021 WOUND #1: - Lower Leg Wound Laterality: Left Cleanser: Normal Saline Every Other Day/15 Days Discharge Instructions: Wash your hands with soap and water. Remove old dressing, discard into plastic bag and place into trash. Cleanse the wound with Normal Saline prior to applying a clean dressing using gauze sponges, not tissues or cotton balls. Do not scrub or use excessive force. Pat dry using gauze sponges, not tissue or cotton balls. Cleanser: Soap and Water Every Other Day/15 Days Discharge Instructions: Gently cleanse wound with antibacterial soap, rinse and pat dry prior to dressing wounds Topical:  Gentamicin Every Other Day/15 Days Discharge Instructions: In office-Apply as directed by provider. Primary Dressing: Hydrofera Blue Ready Transfer Foam, 4x5 (in/in) Every Other Day/15 Days Discharge Instructions: Apply Hydrofera Blue Ready to wound bed as directed Secondary Dressing: Zetuvit Plus Silicone Border Dressing 5x5 (in/in) (DME) (Generic) Every Other Day/15 Days 1. Hydrofera Blue 2. Keflex 3. Follow-up in 1 week Electronic Signature(s) Signed: 06/01/2021 1:34:26 PM By: Kalman Shan DO Entered By: Kalman Shan on 06/01/2021 13:33:14 Akhavan, [name] ([id number]) -------------------------------------------------------------------------------- SuperBill Details Patient Name: Tonya Fail T. Date of Service: 06/01/2021 Medical Record Number: [id number] Patient Account Number: [id number] Date of Birth/Sex: 08/01/1934 (87 y.o. F) Treating RN: [name] Primary Care Provider: [name] Other Clinician: Referring Provider: [name] Treating Provider/Extender: [name] in Treatment: 2 Diagnosis Coding ICD-10 Codes Code Description [phone] Non-pressure chronic ulcer of other part of left lower leg with fat layer exposed S51.002A Unspecified open wound of left elbow, initial encounter L98.491 Non-pressure chronic ulcer of skin of other sites limited to breakdown of skin M31.6 Other giant cell arteritis M81.0 Age-related osteoporosis without current pathological fracture T79.8XXD Other early complications of trauma, subsequent encounter L03.116 Cellulitis of left lower limb Facility Procedures CPT4 Code: [id number] Description: [phone] - WOUND CARE VISIT-LEV 2 EST PT Modifier: Quantity: 1 Physician Procedures CPT4 Code: 2952841 Description: 32440 - WC PHYS LEVEL 4 - EST PT Modifier: Quantity: 1 CPT4 Code: Description: ICD-10 Diagnosis Description L97.822 Non-pressure chronic ulcer of other part of left lower leg with fat lay T79.8XXD Other  early complications of trauma, subsequent encounter L03.116 Cellulitis of left lower limb Modifier: er exposed Quantity: Electronic Signature(s) Signed: 06/01/2021 1:34:26 PM By: Kalman Shan DO Entered By: Kalman Shan on 06/01/2021 13:33:45

## 2021-06-01 NOTE — Telephone Encounter (Signed)
Error

## 2021-06-01 NOTE — Progress Notes (Signed)
Tonya, Robbins ([id number]) Visit Report for 06/01/2021 Arrival Information Details Patient Name: [name], [name] Date of Service: 06/01/2021 1:00 PM Medical Record Number: [id number] Patient Account Number: [id number] Date of Birth/Sex: 1935-01-05 (87 y.o. F) Treating RN: [name] Primary Care Sherard Sutch: [name] Other Clinician: Referring Adora Yeh: [name] Treating Jaquelyn Sakamoto/Extender: [name] in Treatment: 2 Visit Information History Since Last Visit Added or deleted any medications: No Patient Arrived: Ambulatory Had a fall or experienced change in No Arrival Time: 13:09 activities of daily living that may affect Accompanied By: self risk of falls: Transfer Assistance: None Hospitalized since last visit: No Patient Identification Verified: Yes Has Dressing in Place as Prescribed: Yes Secondary Verification Process Completed: Yes Pain Present Now: No Patient Requires Transmission-Based No Precautions: Patient Has Alerts: Yes Patient Alerts: Patient on Blood Thinner 52m Aspirin Electronic Signature(s) Signed: 06/01/2021 4:32:16 PM By: BDonnamarie PoagEntered By: BDonnamarie Poagon 06/01/2021 13:10:43 Gullick, BArlyce Harman([id number] -------------------------------------------------------------------------------- Clinic Level of Care Assessment Details Patient Name: Tonya FailT. Date of Service: 06/01/2021 1:00 PM Medical Record Number: [id number] Account Number: [id number] of Birth/Sex: [birth year](87 y.o. F) Treating RN: BDonnamarie PoagPrimary Care Tad Fancher: JHarrel LemonOther Clinician: Referring Jonnelle Lawniczak: JHarrel LemonTreating Keshanna Riso/Extender: HYaakov Guthriein Treatment: 2 Clinic Level of Care Assessment Items TOOL 4 Quantity Score '[]'  - Use when only an EandM is performed on FOLLOW-UP visit 0 ASSESSMENTS - Nursing Assessment / Reassessment '[]'  - Reassessment of Co-morbidities (includes updates in patient  status) 0 '[]'  - 0 Reassessment of Adherence to Treatment Plan ASSESSMENTS - Wound and Skin Assessment / Reassessment X - Simple Wound Assessment / Reassessment - one wound 1 5 '[]'  - 0 Complex Wound Assessment / Reassessment - multiple wounds '[]'  - 0 Dermatologic / Skin Assessment (not related to wound area) ASSESSMENTS - Focused Assessment '[]'  - Circumferential Edema Measurements - multi extremities 0 '[]'  - 0 Nutritional Assessment / Counseling / Intervention '[]'  - 0 Lower Extremity Assessment (monofilament, tuning fork, pulses) '[]'  - 0 Peripheral Arterial Disease Assessment (using hand held doppler) ASSESSMENTS - Ostomy and/or Continence Assessment and Care '[]'  - Incontinence Assessment and Management 0 '[]'  - 0 Ostomy Care Assessment and Management (repouching, etc.) PROCESS - Coordination of Care X - Simple Patient / Family Education for ongoing care 1 15 '[]'  - 0 Complex (extensive) Patient / Family Education for ongoing care '[]'  - 0 Staff obtains CProgrammer, systems Records, Test Results / Process Orders '[]'  - 0 Staff telephones HHA, Nursing Homes / Clarify orders / etc '[]'  - 0 Routine Transfer to another Facility (non-emergent condition) '[]'  - 0 Routine Hospital Admission (non-emergent condition) '[]'  - 0 New Admissions / IBiomedical engineer/ Ordering NPWT, Apligraf, etc. '[]'  - 0 Emergency Hospital Admission (emergent condition) X- 1 10 Simple Discharge Coordination '[]'  - 0 Complex (extensive) Discharge Coordination PROCESS - Special Needs '[]'  - Pediatric / Minor Patient Management 0 '[]'  - 0 Isolation Patient Management '[]'  - 0 Hearing / Language / Visual special needs '[]'  - 0 Assessment of Community assistance (transportation, D/C planning, etc.) '[]'  - 0 Additional assistance / Altered mentation '[]'  - 0 Support Surface(s) Assessment (bed, cushion, seat, etc.) INTERVENTIONS - Wound Cleansing / Measurement Pepper, Daejah T. ([id number] X- 1 5 Simple Wound Cleansing - one wound '[]'   - 0 Complex Wound Cleansing - multiple wounds X- 1 5 Wound Imaging (photographs - any number of wounds) '[]'  - 0 Wound Tracing (instead of photographs) X- 1 5 Simple Wound Measurement - one  wound '[]'  - 0 Complex Wound Measurement - multiple wounds INTERVENTIONS - Wound Dressings '[]'  - Small Wound Dressing one or multiple wounds 0 X- 1 15 Medium Wound Dressing one or multiple wounds '[]'  - 0 Large Wound Dressing one or multiple wounds X- 1 5 Application of Medications - topical '[]'  - 0 Application of Medications - injection INTERVENTIONS - Miscellaneous '[]'  - External ear exam 0 '[]'  - 0 Specimen Collection (cultures, biopsies, blood, body fluids, etc.) '[]'  - 0 Specimen(s) / Culture(s) sent or taken to Lab for analysis '[]'  - 0 Patient Transfer (multiple staff / Civil Service fast streamer / Similar devices) '[]'  - 0 Simple Staple / Suture removal (25 or less) '[]'  - 0 Complex Staple / Suture removal (26 or more) '[]'  - 0 Hypo / Hyperglycemic Management (close monitor of Blood Glucose) '[]'  - 0 Ankle / Brachial Index (ABI) - do not check if billed separately X- 1 5 Vital Signs Has the patient been seen at the hospital within the last three years: Yes Total Score: 70 Level Of Care: New/Established - Level 2 Electronic Signature(s) Signed: 06/01/2021 4:32:16 PM By: [name] Entered By: [name] on 06/01/2021 13:32:09 Lechtenberg, [name] ([id number]) -------------------------------------------------------------------------------- Encounter Discharge Information Details Patient Name: Tonya Fail T. Date of Service: 06/01/2021 1:00 PM Medical Record Number: [id number] Patient Account Number: [id number] Date of Birth/Sex: Sep 04, 1934 (87 y.o. F) Treating RN: [name] Primary Care Jamielyn Petrucci: [name] Other Clinician: Referring Mahari Vankirk: [name] Treating Priti Consoli/Extender: [name] in Treatment: 2 Encounter Discharge Information Items Discharge Condition:  Stable Ambulatory Status: Ambulatory Discharge Destination: Home Transportation: Private Auto Accompanied By: self Schedule Follow-up Appointment: Yes Clinical Summary of Care: Electronic Signature(s) Signed: 06/01/2021 4:32:16 PM By: [name] Entered By: [name] on 06/01/2021 13:40:21 Nifong, [name] ([id number]) -------------------------------------------------------------------------------- Lower Extremity Assessment Details Patient Name: Tonya Fail T. Date of Service: 06/01/2021 1:00 PM Medical Record Number: [id number] Patient Account Number: [id number] Date of Birth/Sex: 1934/10/14 (87 y.o. F) Treating RN: [name] Primary Care Adalaide Jaskolski: [name] Other Clinician: Referring Rashawd Laskaris: [name] Treating Kadijah Shamoon/Extender: [name] in Treatment: 2 Edema Assessment Assessed: [Left: No] [Right: No] [Left: Edema] [Right: :] Calf Left: Right: Point of Measurement: 34 cm From Medial Instep 41.5 cm Ankle Left: Right: Point of Measurement: 9 cm From Medial Instep 26.2 cm Knee To Floor Left: Right: From Medial Instep 46 cm Vascular Assessment Pulses: Dorsalis Pedis Palpable: [Left:Yes] Electronic Signature(s) Signed: 06/01/2021 4:32:16 PM By: [name] Entered By: [name] on 06/01/2021 13:20:19 Hellard, [name] ([id number]) -------------------------------------------------------------------------------- Multi Wound Chart Details Patient Name: Tonya Fail T. Date of Service: 06/01/2021 1:00 PM Medical Record Number: [id number] Patient Account Number: [id number] Date of Birth/Sex: 08-04-1934 (87 y.o. F) Treating RN: [name] Primary Care Ti[place]ope Emmitt: [name] Other Clinician: Referring Braylen Staller: [name] Treating Donnetta Gillin/Extender: [name] in Treatment: 2 Vital Signs Height(in): 65 Pulse(bpm): 104 Weight(lbs): 169 Blood Pressure(mmHg): 154/57 Body Mass Index(BMI): 28 Temperature(F):  98.3 Respiratory Rate(breaths/min): 16 Photos: [N/A:N/A] Wound Location: Left Lower Leg Left Elbow N/A Wounding Event: Skin Tear/Laceration Skin Tear/Laceration N/A Primary Etiology: Trauma, Other Trauma, Other N/A Comorbid History: Coronary Artery Disease, Coronary Artery Disease, N/A Neuropathy Neuropathy Date Acquired: 05/16/2021 05/16/2021 N/A Weeks of Treatment: 2 2 N/A Wound Status: Open Healed - Epithelialized N/A Measurements L x W x D (cm) 9.1x4.8x0.1 0x0x0 N/A Area (cm) : 34.306 0 N/A Volume (cm) : 3.431 0 N/A % Reduction in Area: -24.80% 100.00% N/A % Reduction in Volume: -24.80% 100.00% N/A Classification: Full  Thickness Without Exposed Partial Thickness N/A Support Structures Exudate Amount: Large None Present N/A Exudate Type: Serosanguineous N/A N/A Exudate Color: red, brown N/A N/A Granulation Amount: Large (67-100%) None Present (0%) N/A Granulation Quality: Red, Pink N/A N/A Necrotic Amount: Small (1-33%) None Present (0%) N/A Exposed Structures: Fat Layer (Subcutaneous Tissue): Fascia: No N/A Yes Fat Layer (Subcutaneous Tissue): Fascia: No No Tendon: No Tendon: No Muscle: No Muscle: No Joint: No Joint: No Bone: No Bone: No Limited to Skin Breakdown Treatment Notes Electronic Signature(s) Signed: 06/01/2021 4:32:16 PM By: [name] Entered By: [name] on 06/01/2021 13:22:21 Grealish, [name] ([id number]) -------------------------------------------------------------------------------- [place] Details Patient Name: Tonya Fail T. Date of Service: 06/01/2021 1:00 PM Medical Record Number: [id number] Patient Account Number: [id number] Date of Birth/Sex: 1934-07-27 (87 y.o. F) Treating RN: [name] Primary Care Kahlel Peake: [name] Other Clinician: Referring Perfecto Purdy: [name] Treating Lashaya Kienitz/Extender: [name] in Treatment: 2 Active Inactive Wound/Skin Impairment Nursing  Diagnoses: Impaired tissue integrity Knowledge deficit related to smoking impact on wound healing Knowledge deficit related to ulceration/compromised skin integrity Goals: Patient/caregiver will verbalize understanding of skin care regimen Date Initiated: 05/18/2021 Date Inactivated: 05/25/2021 Target Resolution Date: 05/27/2021 Goal Status: Met Ulcer/skin breakdown will have a volume reduction of 30% by week 4 Date Initiated: 05/18/2021 Target Resolution Date: 06/13/2021 Goal Status: Active Ulcer/skin breakdown will have a volume reduction of 50% by week 8 Date Initiated: 05/18/2021 Target Resolution Date: 07/11/2021 Goal Status: Active Ulcer/skin breakdown will have a volume reduction of 80% by week 12 Date Initiated: 05/18/2021 Target Resolution Date: 08/08/2021 Goal Status: Active Ulcer/skin breakdown will heal within 14 weeks Date Initiated: 05/18/2021 Target Resolution Date: 08/22/2021 Goal Status: Active Interventions: Assess patient/caregiver ability to obtain necessary supplies Assess patient/caregiver ability to perform ulcer/skin care regimen upon admission and as needed Assess ulceration(s) every visit Notes: Electronic Signature(s) Signed: 06/01/2021 4:32:16 PM By: [name] Entered By: [name] on 06/01/2021 13:20:46 Estock, [name] ([id number]) -------------------------------------------------------------------------------- Pain Assessment Details Patient Name: Tonya Fail T. Date of Service: 06/01/2021 1:00 PM Medical Record Number: [id number] Patient Account Number: [id number] Date of Birth/Sex: February 24, 1935 (87 y.o. F) Treating RN: [name] Primary Care Jsiah Menta: [name] Other Clinician: Referring Bibiana Gillean: [name] Treating Heber Hoog/Extender: [name] in Treatment: 2 Active Problems Location of Pain Severity and Description of Pain Patient Has Paino No Site Locations Rate the pain. Current Pain Level: 0 Pain  Management and Medication Current Pain Management: Electronic Signature(s) Signed: 06/01/2021 4:32:16 PM By: [name] Entered By: [name] on 06/01/2021 13:11:28 Gargus, [name] ([id number]) -------------------------------------------------------------------------------- Patient/Caregiver Education Details Patient Name: Tonya Fail T. Date of Service: 06/01/2021 1:00 PM Medical Record Number: [id number] Patient Account Number: [id number] Date of Birth/Gender: September 19, 1934 (87 y.o. F) Treating RN: [name] Primary Care Physician: [name] Other Clinician: Referring Physician: [name] Treating Physician/Extender: [name] in Treatment: 2 Education Assessment Education Provided To: Patient Education Topics Provided Basic Hygiene: Wound/Skin Impairment: Electronic Signature(s) Signed: 06/01/2021 4:32:16 PM By: [name] Entered By: [name] on 06/01/2021 13:32:53 Willmon, [name] ([id number]) -------------------------------------------------------------------------------- Wound Assessment Details Patient Name: Tonya Fail T. Date of Service: 06/01/2021 1:00 PM Medical Record Number: [id number] Patient Account Number: [id number] Date of Birth/Sex: 08/16/1934 (87 y.o. F) Treating RN: [name] Primary Care Corley Maffeo: [name] Other Clinician: Referring Takai Chiaramonte: [name] Treating Khaleb Broz/Extender: [name] in Treatment: 2 Wound Status Wound Number: 1 Primary Etiology: Trauma, Other Wound Location: Left Lower Leg Wound Status: Open Wounding Event: Skin  Tear/Laceration Comorbid History: Coronary Artery Disease, Neuropathy Date Acquired: 05/16/2021 Weeks Of Treatment: 2 Clustered Wound: No Photos Wound Measurements Length: (cm) 9.1 Width: (cm) 4.8 Depth: (cm) 0.1 Area: (cm) 34.306 Volume: (cm) 3.431 % Reduction in Area: -24.8% % Reduction in Volume: -24.8% Tunneling: No Undermining:  No Wound Description Classification: Full Thickness Without Exposed Support Structures Exudate Amount: Large Exudate Type: Serosanguineous Exudate Color: red, brown Foul Odor After Cleansing: No Slough/Fibrino Yes Wound Bed Granulation Amount: Large (67-100%) Exposed Structure Granulation Quality: Red, Pink Fascia Exposed: No Necrotic Amount: Small (1-33%) Fat Layer (Subcutaneous Tissue) Exposed: Yes Necrotic Quality: Adherent Slough Tendon Exposed: No Muscle Exposed: No Joint Exposed: No Bone Exposed: No Treatment Notes Wound #1 (Lower Leg) Wound Laterality: Left Cleanser Normal Saline Discharge Instruction: Wash your hands with soap and water. Remove old dressing, discard into plastic bag and place into trash. Cleanse the wound with Normal Saline prior to applying a clean dressing using gauze sponges, not tissues or cotton balls. Do not scrub or use excessive force. Pat dry using gauze sponges, not tissue or cotton balls. Soap and Water Tipps, [place] T. ([id number]) Discharge Instruction: Gently cleanse wound with antibacterial soap, rinse and pat dry prior to dressing wounds Peri-Wound Care Topical Gentamicin Discharge Instruction: In office-Apply as directed by Chasitty Hehl. Primary Dressing Hydrofera Blue Ready Transfer Foam, 4x5 (in/in) Discharge Instruction: Apply Hydrofera Blue Ready to wound bed as directed Secondary Dressing Mepilex Border Flex, 6x6 (in/in) Discharge Instruction: Apply to wound as directed. Do not cut. Secured With Compression Wrap Compression Stockings Add-Ons Electronic Signature(s) Signed: 06/01/2021 4:32:16 PM By: [name] Entered By: [name] on 06/01/2021 13:18:55 Amaro, [name] ([id number]) -------------------------------------------------------------------------------- Wound Assessment Details Patient Name: Tonya Fail T. Date of Service: 06/01/2021 1:00 PM Medical Record Number: [id number] Patient Account Number:  [id number] Date of Birth/Sex: 1934-09-03 (87 y.o. F) Treating RN: [name] Primary Care Zamara Cozad: [name] Other Clinician: Referring Burlie Cajamarca: [name] Treating Katrisha Segall/Extender: [name] in Treatment: 2 Wound Status Wound Number: 2 Primary Etiology: Trauma, Other Wound Location: Left Elbow Wound Status: Healed - Epithelialized Wounding Event: Skin Tear/Laceration Comorbid History: Coronary Artery Disease, Neuropathy Date Acquired: 05/16/2021 Weeks Of Treatment: 2 Clustered Wound: No Photos Wound Measurements Length: (cm) 0 Width: (cm) 0 Depth: (cm) 0 Area: (cm) 0 Volume: (cm) 0 % Reduction in Area: 100% % Reduction in Volume: 100% Tunneling: No Undermining: No Wound Description Classification: Partial Thickness Exudate Amount: None Present Foul Odor After Cleansing: No Slough/Fibrino No Wound Bed Granulation Amount: None Present (0%) Exposed Structure Necrotic Amount: None Present (0%) Fascia Exposed: No Fat Layer (Subcutaneous Tissue) Exposed: No Tendon Exposed: No Muscle Exposed: No Joint Exposed: No Bone Exposed: No Limited to Skin Breakdown Electronic Signature(s) Signed: 06/01/2021 4:32:16 PM By: [name] Entered By: [name] on 06/01/2021 13:13:17 Marcy, [name] ([id number]) -------------------------------------------------------------------------------- [place] Details Patient Name: Tonya Fail T. Date of Service: 06/01/2021 1:00 PM Medical Record Number: [id number] Patient Account Number: [id number] Date of Birth/Sex: [birth year] (87 y.o. F) Treating RN: [name] Primary Care Hallee Mckenny: [name] Other Clinician: Referring Rushil Kimbrell: [name] Treating Santez Woodcox/Extender: [name] in Treatment: 2 Vital Signs Time Taken: 13:10 Temperature (F): 98.3 Height (in): 65 Pulse (bpm): 104 Weight (lbs): 169 Respiratory Rate (breaths/min): 16 Body Mass Index (BMI): 28.1 Blood Pressure  (mmHg): 154/57 Reference Range: 80 - 120 mg / dl Electronic Signature(s) Signed: 06/01/2021 4:32:16 PM By: [name] Entered ByDonnamarie Poag on 06/01/2021 13:11:07

## 2021-06-06 ENCOUNTER — Telehealth: Payer: Self-pay | Admitting: Internal Medicine

## 2021-06-06 NOTE — Telephone Encounter (Signed)
Pt called to cancel her appt for 1-10. Has a death in family. Wants to know if she could change to 10-11? Call back at [phone]

## 2021-06-07 ENCOUNTER — Inpatient Hospital Stay: Payer: Medicare Other

## 2021-06-08 ENCOUNTER — Other Ambulatory Visit: Payer: Self-pay

## 2021-06-08 ENCOUNTER — Inpatient Hospital Stay: Payer: Medicare Other

## 2021-06-08 ENCOUNTER — Encounter (HOSPITAL_BASED_OUTPATIENT_CLINIC_OR_DEPARTMENT_OTHER): Payer: Medicare Other | Admitting: Internal Medicine

## 2021-06-08 DIAGNOSIS — I10 Essential (primary) hypertension: Secondary | ICD-10-CM | POA: Diagnosis not present

## 2021-06-08 DIAGNOSIS — D708 Other neutropenia: Secondary | ICD-10-CM

## 2021-06-08 DIAGNOSIS — M316 Other giant cell arteritis: Secondary | ICD-10-CM | POA: Diagnosis not present

## 2021-06-08 DIAGNOSIS — M810 Age-related osteoporosis without current pathological fracture: Secondary | ICD-10-CM | POA: Diagnosis not present

## 2021-06-08 DIAGNOSIS — L97822 Non-pressure chronic ulcer of other part of left lower leg with fat layer exposed: Secondary | ICD-10-CM

## 2021-06-08 DIAGNOSIS — L03116 Cellulitis of left lower limb: Secondary | ICD-10-CM | POA: Diagnosis not present

## 2021-06-08 DIAGNOSIS — T798XXA Other early complications of trauma, initial encounter: Secondary | ICD-10-CM | POA: Diagnosis not present

## 2021-06-08 DIAGNOSIS — L98491 Non-pressure chronic ulcer of skin of other sites limited to breakdown of skin: Secondary | ICD-10-CM | POA: Diagnosis not present

## 2021-06-08 LAB — CBC WITH DIFFERENTIAL/PLATELET
Abs Immature Granulocytes: 0.03 10*3/uL (ref 0.00–0.07)
Basophils Absolute: 0 10*3/uL (ref 0.0–0.1)
Basophils Relative: 0 %
Eosinophils Absolute: 0 10*3/uL (ref 0.0–0.5)
Eosinophils Relative: 0 %
HCT: 46.1 % — ABNORMAL HIGH (ref 36.0–46.0)
Hemoglobin: 14.3 g/dL (ref 12.0–15.0)
Immature Granulocytes: 1 %
Lymphocytes Relative: 32 %
Lymphs Abs: 0.9 10*3/uL (ref 0.7–4.0)
MCH: 28.8 pg (ref 26.0–34.0)
MCHC: 31 g/dL (ref 30.0–36.0)
MCV: 92.9 fL (ref 80.0–100.0)
Monocytes Absolute: 0.1 10*3/uL (ref 0.1–1.0)
Monocytes Relative: 5 %
Neutro Abs: 1.7 10*3/uL (ref 1.7–7.7)
Neutrophils Relative %: 62 %
Platelets: 202 10*3/uL (ref 150–400)
RBC: 4.96 MIL/uL (ref 3.87–5.11)
RDW: 15 % (ref 11.5–15.5)
Smear Review: NORMAL
WBC: 2.8 10*3/uL — ABNORMAL LOW (ref 4.0–10.5)
nRBC: 0 % (ref 0.0–0.2)

## 2021-06-08 NOTE — Progress Notes (Signed)
MACARIA, BIAS ([id number]) Visit Report for 06/08/2021 Arrival Information Details Patient Name: [name], [name] Date of Service: 06/08/2021 1:00 PM Medical Record Number: [id number] Patient Account Number: [id number] Date of Birth/Sex: Nov 08, 1934 (86 y.o. F) Treating RN: [name] Primary Care Trang Bouse: [name] Other Clinician: Referring Kevia Zaucha: [name] Treating Merdis Snodgrass/Extender: [name] in Treatment: 3 Visit Information History Since Last Visit Added or deleted any medications: No Patient Arrived: Ambulatory Had a fall or experienced change in No Arrival Time: 13:04 activities of daily living that may affect Accompanied By: self risk of falls: Transfer Assistance: None Hospitalized since last visit: No Patient Identification Verified: Yes Has Dressing in Place as Prescribed: Yes Secondary Verification Process Completed: Yes Pain Present Now: No Patient Requires Transmission-Based No Precautions: Patient Has Alerts: Yes Patient Alerts: Patient on Blood Thinner 5m Aspirin Electronic Signature(s) Signed: 06/08/2021 4:15:42 PM By: BDonnamarie PoagEntered By: BDonnamarie Poagon 06/08/2021 13:07:49 Tyler, BArlyce Harman([id number] -------------------------------------------------------------------------------- Encounter Discharge Information Details Patient Name: Tonya FailT. Date of Service: 06/08/2021 1:00 PM Medical Record Number: [id number] Account Number: [id number] of Birth/Sex: 307-09-1934(86 y.o. F) Treating RN: BDonnamarie PoagPrimary Care Maricus Tanzi: JHarrel LemonOther Clinician: Referring Rowene Suto: JHarrel LemonTreating Phoenyx Paulsen/Extender: HYaakov Guthriein Treatment: 3 Encounter Discharge Information Items Post Procedure Vitals Discharge Condition: Stable Temperature (F): 98.2 Ambulatory Status: Ambulatory Pulse (bpm): 100 Discharge Destination: Home Respiratory Rate (breaths/min):  16 Transportation: Private Auto Blood Pressure (mmHg): 178/67 Accompanied By: self Schedule Follow-up Appointment: Yes Clinical Summary of Care: Electronic Signature(s) Signed: 06/08/2021 4:15:42 PM By: BDonnamarie PoagEntered By: BDonnamarie Poagon 06/08/2021 14:01:54 Vanmeter, BArlyce Harman([id number] -------------------------------------------------------------------------------- Lower Extremity Assessment Details Patient Name: Tonya FailT. Date of Service: 06/08/2021 1:00 PM Medical Record Number: [id number] Account Number: [id number] of Birth/Sex: [birth year](86 y.o. F) Treating RN: BDonnamarie PoagPrimary Care Alahni Varone: JHarrel LemonOther Clinician: Referring Shereese Bonnie: JHarrel LemonTreating Timesha Cervantez/Extender: HYaakov Guthriein Treatment: 3 Edema Assessment Assessed: [Left: Yes] [Right: No] Edema: [Left: N] [Right: o] Calf Left: Right: Point of Measurement: 34 cm From Medial Instep 41 cm Ankle Left: Right: Point of Measurement: 9 cm From Medial Instep 26 cm Knee To Floor Left: Right: From Medial Instep 46 cm Vascular Assessment Pulses: Dorsalis Pedis Palpable: [Left:Yes] Electronic Signature(s) Signed: 06/08/2021 4:15:42 PM By: BDonnamarie PoagEntered By: BDonnamarie Poagon 06/08/2021 13:16:24 Klomp, BArlyce Harman([id number] -------------------------------------------------------------------------------- Multi Wound Chart Details Patient Name: Tonya FailT. Date of Service: 06/08/2021 1:00 PM Medical Record Number: [id number] Account Number: [id number] of Birth/Sex: 322-Aug-1936(86 y.o. F) Treating RN: BDonnamarie PoagPrimary Care Lou Loewe: JHarrel LemonOther Clinician: Referring Pietrina Jagodzinski: JHarrel LemonTreating Perley Arthurs/Extender: HYaakov Guthriein Treatment: 3 Vital Signs Height(in): 65 Pulse(bpm): 100 Weight(lbs): 169 Blood Pressure(mmHg): 178/67 Body Mass Index(BMI): 28 Temperature(F): 98.2 Respiratory  Rate(breaths/min): 16 Photos: [N/A:N/A] Wound Location: Left Lower Leg N/A N/A Wounding Event: Skin Tear/Laceration N/A N/A Primary Etiology: Trauma, Other N/A N/A Comorbid History: Coronary Artery Disease, N/A N/A Neuropathy Date Acquired: 05/16/2021 N/A N/A Weeks of Treatment: 3 N/A N/A Wound Status: Open N/A N/A Measurements L x W x D (cm) 4x1.7x0.1 N/A N/A Area (cm) : 5.341 N/A N/A Volume (cm) : 0.534 N/A N/A % Reduction in Area: 80.60% N/A N/A % Reduction in Volume: 80.60% N/A N/A Classification: Full Thickness Without Exposed N/A N/A Support Structures Exudate Amount: Large N/A N/A Exudate Type: Serosanguineous N/A N/A Exudate Color: red, brown N/A N/A Granulation Amount: Large (67-100%) N/A N/A Granulation  Quality: Red, Pink, Hyper-granulation N/A N/A Necrotic Amount: Small (1-33%) N/A N/A Exposed Structures: Fat Layer (Subcutaneous Tissue): N/A N/A Yes Fascia: No Tendon: No Muscle: No Joint: No Bone: No Treatment Notes Electronic Signature(s) Signed: 06/08/2021 4:15:42 PM By: [name] Entered By: [name] on 06/08/2021 13:16:53 Balsam, [name] ([id number]) -------------------------------------------------------------------------------- Wells Details Patient Name: Tonya Fail T. Date of Service: 06/08/2021 1:00 PM Medical Record Number: [id number] Patient Account Number: [id number] Date of Birth/Sex: [birth year] (86 y.o. F) Treating RN: [name] Primary Care Judie Hollick: [name] Other Clinician: Referring Blakley Michna: [name] Treating Kyriakos Babler/Extender: [name] in Treatment: 3 Active Inactive Wound/Skin Impairment Nursing Diagnoses: Impaired tissue integrity Knowledge deficit related to smoking impact on wound healing Knowledge deficit related to ulceration/compromised skin integrity Goals: Patient/caregiver will verbalize understanding of skin care regimen Date Initiated: 05/18/2021 Date  Inactivated: 05/25/2021 Target Resolution Date: 05/27/2021 Goal Status: Met Ulcer/skin breakdown will have a volume reduction of 30% by week 4 Date Initiated: 05/18/2021 Date Inactivated: 06/08/2021 Target Resolution Date: 06/13/2021 Goal Status: Met Ulcer/skin breakdown will have a volume reduction of 50% by week 8 Date Initiated: 05/18/2021 Target Resolution Date: 07/11/2021 Goal Status: Active Ulcer/skin breakdown will have a volume reduction of 80% by week 12 Date Initiated: 05/18/2021 Target Resolution Date: 08/08/2021 Goal Status: Active Ulcer/skin breakdown will heal within 14 weeks Date Initiated: 05/18/2021 Target Resolution Date: 08/22/2021 Goal Status: Active Interventions: Assess patient/caregiver ability to obtain necessary supplies Assess patient/caregiver ability to perform ulcer/skin care regimen upon admission and as needed Assess ulceration(s) every visit Notes: Electronic Signature(s) Signed: 06/08/2021 4:15:42 PM By: [name] Entered By: [name] on 06/08/2021 13:16:40 Schetter, [name] ([id number]) -------------------------------------------------------------------------------- Pain Assessment Details Patient Name: Tonya Fail T. Date of Service: 06/08/2021 1:00 PM Medical Record Number: [id number] Patient Account Number: [id number] Date of Birth/Sex: 1934-09-27 (86 y.o. F) Treating RN: [name] Primary Care Cailynn Bodnar: [name] Other Clinician: Referring Jennyfer Nickolson: [name] Treating Lashay Osborne/Extender: [name] in Treatment: 3 Active Problems Location of Pain Severity and Description of Pain Patient Has Paino No Site Locations Rate the pain. Current Pain Level: 0 Pain Management and Medication Current Pain Management: Electronic Signature(s) Signed: 06/08/2021 4:15:42 PM By: [name] Entered By: [name] on 06/08/2021 13:09:47 Luton, [name]  ([id number]) -------------------------------------------------------------------------------- Patient/Caregiver Education Details Patient Name: Tonya Fail T. Date of Service: 06/08/2021 1:00 PM Medical Record Number: [id number] Patient Account Number: [id number] Date of Birth/Gender: 1934-06-08 (87 y.o. F) Treating RN: [name] Primary Care Physician: [name] Other Clinician: Referring Physician: [name] Treating Physician/Extender: [name] in Treatment: 3 Education Assessment Education Provided To: Patient Education Topics Provided Basic Hygiene: Infection: Wound/Skin Impairment: Electronic Signature(s) Signed: 06/08/2021 4:15:42 PM By: [name] Entered By: [name] on 06/08/2021 13:56:05 Semel, [name] ([id number]) -------------------------------------------------------------------------------- Wound Assessment Details Patient Name: Tonya Fail T. Date of Service: 06/08/2021 1:00 PM Medical Record Number: [id number] Patient Account Number: [id number] Date of Birth/Sex: [birth year] (86 y.o. F) Treating RN: [name] Primary Care Printice Hellmer: [name] Other Clinician: Referring Felton Buczynski: [name] Treating Kairyn Olmeda/Extender: [name] in Treatment: 3 Wound Status Wound Number: 1 Primary Etiology: Trauma, Other Wound Location: Left Lower Leg Wound Status: Open Wounding Event: Skin Tear/Laceration Comorbid History: Coronary Artery Disease, Neuropathy Date Acquired: 05/16/2021 Weeks Of Treatment: 3 Clustered Wound: No Photos Wound Measurements Length: (cm) 4 Width: (cm) 1.7 Depth: (cm) 0.1 Area: (cm) 5.341 Volume: (cm) 0.534 % Reduction in Area: 80.6% % Reduction in Volume: 80.6% Tunneling: No Undermining: No  Wound Description Classification: Full Thickness Without Exposed Support Structures Exudate Amount: Large Exudate Type: Serosanguineous Exudate Color: red, brown Foul Odor After  Cleansing: No Slough/Fibrino Yes Wound Bed Granulation Amount: Large (67-100%) Exposed Structure Granulation Quality: Red, Pink, Hyper-granulation Fascia Exposed: No Necrotic Amount: Small (1-33%) Fat Layer (Subcutaneous Tissue) Exposed: Yes Necrotic Quality: Adherent Slough Tendon Exposed: No Muscle Exposed: No Joint Exposed: No Bone Exposed: No Treatment Notes Wound #1 (Lower Leg) Wound Laterality: Left Cleanser Normal Saline Discharge Instruction: Wash your hands with soap and water. Remove old dressing, discard into plastic bag and place into trash. Cleanse the wound with Normal Saline prior to applying a clean dressing using gauze sponges, not tissues or cotton balls. Do not scrub or use excessive force. Pat dry using gauze sponges, not tissue or cotton balls. Soap and Water Basurto, Beclabito T. ([id number]) Discharge Instruction: Gently cleanse wound with antibacterial soap, rinse and pat dry prior to dressing wounds Peri-Wound Care Topical Gentamicin Discharge Instruction: In office-Apply as directed by Aniesa Boback. Primary Dressing Hydrofera Blue Ready Transfer Foam, 4x5 (in/in) Discharge Instruction: Apply Hydrofera Blue Ready to wound bed as directed Secondary Dressing Mepilex Border Flex, 6x6 (in/in) Discharge Instruction: Apply to wound as directed. Do not cut. Secured With Compression Wrap Compression Stockings Add-Ons Electronic Signature(s) Signed: 06/08/2021 4:15:42 PM By: [name] Entered By: [name] on 06/08/2021 13:15:29 Sroka, [name] ([id number]) -------------------------------------------------------------------------------- Vitals Details Patient Name: Tonya Fail T. Date of Service: 06/08/2021 1:00 PM Medical Record Number: [id number] Patient Account Number: [id number] Date of Birth/Sex: 03-20-1935 (86 y.o. F) Treating RN: [name] Primary Care Kenda Kloehn: [name] Other Clinician: Referring Brigitta Pricer: [name] Treating Starlena Beil/Extender: [name] in Treatment: 3 Vital Signs Time Taken: 13:07 Temperature (F): 98.2 Height (in): 65 Pulse (bpm): 100 Weight (lbs): 169 Respiratory Rate (breaths/min): 16 Body Mass Index (BMI): 28.1 Blood Pressure (mmHg): 178/67 Reference Range: 80 - 120 mg / dl Electronic Signature(s) Signed: 06/08/2021 4:15:42 PM By: [name] Entered ByDonnamarie Poag on 06/08/2021 13:09:35

## 2021-06-08 NOTE — Progress Notes (Signed)
[name] ([id number]) Visit Report for 06/08/2021 Chief Complaint Document Details Patient Name: [name], [name] Date of Service: 06/08/2021 1:00 PM Medical Record Number: [id number] Patient Account Number: [id number] Date of Birth/Sex: 1934-09-20 (86 y.o. F) Treating RN: [name] Primary Care Provider: [name] Other Clinician: Referring Provider: [name] Treating Provider/Extender: [name] in Treatment: 3 Information Obtained from: Patient Chief Complaint Left lower extremity and left elbow wounds following a fall Electronic Signature(s) Signed: 06/08/2021 2:21:45 PM By: Kalman Shan DO Entered By: Kalman Shan on 06/08/2021 14:18:14 [name] ([id number]) -------------------------------------------------------------------------------- Debridement Details Patient Name: Tonya Fail T. Date of Service: 06/08/2021 1:00 PM Medical Record Number: [id number] Patient Account Number: [id number] Date of Birth/Sex: 1934-08-25 (86 y.o. F) Treating RN: [name] Primary Care Provider: [name] Other Clinician: Referring Provider: [name] Treating Provider/Extender: [name] in Treatment: 3 Debridement Performed for Wound #1 Left Lower Leg Assessment: Performed By: Physician Kalman Shan, MD Debridement Type: Debridement Level of Consciousness (Pre- Awake and Alert procedure): Pre-procedure Verification/Time Out Yes - 13:50 Taken: Start Time: 13:50 Pain Control: Lidocaine Total Area Debrided (L x W): 3 (cm) x 2 (cm) = 6 (cm) Tissue and other material Viable, Non-Viable, Slough, Subcutaneous, Slough debrided: Level: Skin/Subcutaneous Tissue Debridement Description: Excisional Instrument: Curette Bleeding: Minimum Hemostasis Achieved: Pressure Response to Treatment: Procedure was tolerated well Level of Consciousness (Post- Awake and Alert procedure): Post Debridement  Measurements of Total Wound Length: (cm) 4 Width: (cm) 1.7 Depth: (cm) 0.1 Volume: (cm) 0.534 Character of Wound/Ulcer Post Debridement: Improved Post Procedure Diagnosis Same as Pre-procedure Electronic Signature(s) Signed: 06/08/2021 2:21:45 PM By: Kalman Shan DO Signed: 06/08/2021 4:15:42 PM By: [name] Entered By: [name] on 06/08/2021 13:53:09 [name] ([id number]) -------------------------------------------------------------------------------- HPI Details Patient Name: Tonya Fail T. Date of Service: 06/08/2021 1:00 PM Medical Record Number: [id number] Patient Account Number: [id number] Date of Birth/Sex: 04-04-1935 (86 y.o. F) Treating RN: [name] Primary Care Provider: [name] Other Clinician: Referring Provider: [name] Treating Provider/Extender: [name] in Treatment: 3 History of Present Illness HPI Description: Admission 05/18/2021 [name] is an 87 year old female with a past medical history of osteoporosis, temporal arteritis and hypertension that presents to the clinic for a left lower extremity wound and left elbow wound due to a fall. She states she went to the attic to retrieve an item and she fell and injured her left side. She visited dermatology for this issue and was referred to our clinic. She is currently been keeping the area covered. She currently denies signs of infection. She reports mild tenderness to the wound beds. 12/28; patient presents for follow-up. She has been using Hydrofera Blue to the wound beds. She has a scab to the left elbow wound. She has no issues or complaints today. She currently denies signs of infection. 1/4; patient presents for follow-up. She has been using Hydrofera blue with dressing changes. She reports more pain to the wound bed in the past week. She denies purulent drainage. 1/11; patient presents for follow-up. She reports improvement in her wound  healing. She is still taking the antibiotic prescribed at last clinic visit. She has been using Hydrofera Blue to the wound bed. She has no issues or complaints today. Electronic Signature(s) Signed: 06/08/2021 2:21:45 PM By: Kalman Shan DO Entered By: Kalman Shan on 06/08/2021 14:18:40 [name] ([id number]) -------------------------------------------------------------------------------- Physical Exam Details Patient Name: Tonya Fail T. Date of Service: 06/08/2021 1:00 PM Medical Record Number: [id number] Patient Account  Number: [id number] Date of Birth/Sex: 1934-06-13 (86 y.o. F) Treating RN: [name] Primary Care Provider: [name] Other Clinician: Referring Provider: [name] Treating Provider/Extender: [name] in Treatment: 3 Constitutional . Psychiatric . Notes Left lower extremity: Open wound with nonviable tissue and mostly granulation tissue present. Epithelialization occurring throughout the wound bed. No signs of infection. Electronic Signature(s) Signed: 06/08/2021 2:21:45 PM By: Kalman Shan DO Entered By: Kalman Shan on 06/08/2021 14:19:14 [name] ([id number]) -------------------------------------------------------------------------------- Physician Orders Details Patient Name: Tonya Fail T. Date of Service: 06/08/2021 1:00 PM Medical Record Number: [id number] Patient Account Number: [id number] Date of Birth/Sex: 04/17/1935 (86 y.o. F) Treating RN: [name] Primary Care Provider: [name] Other Clinician: Referring Provider: [name] Treating Provider/Extender: [name] in Treatment: 3 Verbal / Phone Orders: No Diagnosis Coding Follow-up Appointments o Return Appointment in 1 week. o Nurse Visit as needed Bathing/ Shower/ Hygiene o Clean wound with Normal Saline or wound cleanser. o May shower; gently cleanse wound with antibacterial soap,  rinse and pat dry prior to dressing wounds o No tub bath. Anesthetic (Use 'Patient Medications' Section for Anesthetic Order Entry) o Lidocaine applied to wound bed Edema Control - Lymphedema / Segmental Compressive Device / Other o Patient to wear own compression stockings. Remove compression stockings every night before going to bed and put on every morning when getting up. o Elevate, Exercise Daily and Avoid Standing for Long Periods of Time. o Elevate leg(s) parallel to the floor when sitting. o DO YOUR BEST to sleep in the bed at night. DO NOT sleep in your recliner. Long hours of sitting in a recliner leads to swelling of the legs and/or potential wounds on your backside. Additional Orders / Instructions o Follow Nutritious Diet and Increase Protein Intake Medications-Please add to medication list. o P.O. Antibiotics - Finish as directed Wound Treatment Wound #1 - Lower Leg Wound Laterality: Left Cleanser: Normal Saline Every Other Day/15 Days Discharge Instructions: Wash your hands with soap and water. Remove old dressing, discard into plastic bag and place into trash. Cleanse the wound with Normal Saline prior to applying a clean dressing using gauze sponges, not tissues or cotton balls. Do not scrub or use excessive force. Pat dry using gauze sponges, not tissue or cotton balls. Cleanser: Soap and Water Every Other Day/15 Days Discharge Instructions: Gently cleanse wound with antibacterial soap, rinse and pat dry prior to dressing wounds Topical: Gentamicin Every Other Day/15 Days Discharge Instructions: In office-Apply as directed by provider. Primary Dressing: Hydrofera Blue Ready Transfer Foam, 4x5 (in/in) Every Other Day/15 Days Discharge Instructions: Apply Hydrofera Blue Ready to wound bed as directed Secondary Dressing: Mepilex Border Flex, 6x6 (in/in) (Generic) Every Other Day/15 Days Discharge Instructions: Apply to wound as directed. Do not  cut. Electronic Signature(s) Signed: 06/08/2021 2:21:45 PM By: Kalman Shan DO Entered By: Kalman Shan on 06/08/2021 14:21:23 Favela, Arlyce Robbins ([id number]) -------------------------------------------------------------------------------- Problem List Details Patient Name: Tonya Fail T. Date of Service: 06/08/2021 1:00 PM Medical Record Number: [id number] Patient Account Number: [id number] Date of Birth/Sex: June 06, 1934 (86 y.o. F) Treating RN: [name] Primary Care Provider: [name] Other Clinician: Referring Provider: [name] Treating Provider/Extender: [name] in Treatment: 3 Active Problems ICD-10 Encounter Code Description Active Date MDM Diagnosis [phone] Non-pressure chronic ulcer of other part of left lower leg with fat layer 05/18/2021 No Yes exposed S51.002A Unspecified open wound of left elbow, initial encounter 05/18/2021 No Yes L98.491 Non-pressure chronic ulcer of skin of other sites  limited to breakdown of 05/18/2021 No Yes skin M31.6 Other giant cell arteritis 05/18/2021 No Yes M81.0 Age-related osteoporosis without current pathological fracture 05/18/2021 No Yes T79.8XXD Other early complications of trauma, subsequent encounter 05/18/2021 No Yes L03.116 Cellulitis of left lower limb 06/01/2021 No Yes Inactive Problems Resolved Problems Electronic Signature(s) Signed: 06/08/2021 2:21:45 PM By: Kalman Shan DO Entered By: Kalman Shan on 06/08/2021 14:17:33 Kempe, Arlyce Robbins ([id number]) -------------------------------------------------------------------------------- Progress Note Details Patient Name: Tonya Fail T. Date of Service: 06/08/2021 1:00 PM Medical Record Number: [id number] Patient Account Number: [id number] Date of Birth/Sex: 09/23/1934 (86 y.o. F) Treating RN: [name] Primary Care Provider: [name] Other Clinician: Referring Provider: [name] Treating  Provider/Extender: [name] in Treatment: 3 Subjective Chief Complaint Information obtained from Patient Left lower extremity and left elbow wounds following a fall History of Present Illness (HPI) Admission 05/18/2021 Ms. Shailyn Weyandt is an 87 year old female with a past medical history of osteoporosis, temporal arteritis and hypertension that presents to the clinic for a left lower extremity wound and left elbow wound due to a fall. She states she went to the attic to retrieve an item and she fell and injured her left side. She visited dermatology for this issue and was referred to our clinic. She is currently been keeping the area covered. She currently denies signs of infection. She reports mild tenderness to the wound beds. 12/28; patient presents for follow-up. She has been using Hydrofera Blue to the wound beds. She has a scab to the left elbow wound. She has no issues or complaints today. She currently denies signs of infection. 1/4; patient presents for follow-up. She has been using Hydrofera blue with dressing changes. She reports more pain to the wound bed in the past week. She denies purulent drainage. 1/11; patient presents for follow-up. She reports improvement in her wound healing. She is still taking the antibiotic prescribed at last clinic visit. She has been using Hydrofera Blue to the wound bed. She has no issues or complaints today. Objective Constitutional Vitals Time Taken: 1:07 PM, Height: 65 in, Weight: 169 lbs, BMI: 28.1, Temperature: 98.2 F, Pulse: 100 bpm, Respiratory Rate: 16 breaths/min, Blood Pressure: 178/67 mmHg. General Notes: Left lower extremity: Open wound with nonviable tissue and mostly granulation tissue present. Epithelialization occurring throughout the wound bed. No signs of infection. Integumentary (Hair, Skin) Wound #1 status is Open. Original cause of wound was Skin Tear/Laceration. The date acquired was: 05/16/2021. The wound  has been in treatment 3 weeks. The wound is located on the Left Lower Leg. The wound measures 4cm length x 1.7cm width x 0.1cm depth; 5.341cm^2 area and 0.534cm^3 volume. There is Fat Layer (Subcutaneous Tissue) exposed. There is no tunneling or undermining noted. There is a large amount of serosanguineous drainage noted. There is large (67-100%) red, pink, hyper - granulation within the wound bed. There is a small (1-33%) amount of necrotic tissue within the wound bed including Adherent Slough. Assessment Active Problems ICD-10 Non-pressure chronic ulcer of other part of left lower leg with fat layer exposed Unspecified open wound of left elbow, initial encounter Non-pressure chronic ulcer of skin of other sites limited to breakdown of skin Other giant cell arteritis Age-related osteoporosis without current pathological fracture Other early complications of trauma, subsequent encounter Cellulitis of left lower limb Geidel, Khalia T. ([id number]) Patient's wound has shown significant improvement in appearance and size since last clinic visit. No signs of infection on exam. I debrided nonviable tissue. I recommended continuing Hydrofera Blue  with dressing changes. She can finish out her antibiotic. Procedures Wound #1 Pre-procedure diagnosis of Wound #1 is a Trauma, Other located on the Left Lower Leg . There was a Excisional Skin/Subcutaneous Tissue Debridement with a total area of 6 sq cm performed by Kalman Shan, MD. With the following instrument(s): Curette to remove Viable and Non-Viable tissue/material. Material removed includes Subcutaneous Tissue and Slough and after achieving pain control using Lidocaine. A time out was conducted at 13:50, prior to the start of the procedure. A Minimum amount of bleeding was controlled with Pressure. The procedure was tolerated well. Post Debridement Measurements: 4cm length x 1.7cm width x 0.1cm depth; 0.534cm^3 volume. Character of  Wound/Ulcer Post Debridement is improved. Post procedure Diagnosis Wound #1: Same as Pre-Procedure Plan Follow-up Appointments: Return Appointment in 1 week. Nurse Visit as needed Bathing/ Shower/ Hygiene: Clean wound with Normal Saline or wound cleanser. May shower; gently cleanse wound with antibacterial soap, rinse and pat dry prior to dressing wounds No tub bath. Anesthetic (Use 'Patient Medications' Section for Anesthetic Order Entry): Lidocaine applied to wound bed Edema Control - Lymphedema / Segmental Compressive Device / Other: Patient to wear own compression stockings. Remove compression stockings every night before going to bed and put on every morning when getting up. Elevate, Exercise Daily and Avoid Standing for Long Periods of Time. Elevate leg(s) parallel to the floor when sitting. DO YOUR BEST to sleep in the bed at night. DO NOT sleep in your recliner. Long hours of sitting in a recliner leads to swelling of the legs and/or potential wounds on your backside. Additional Orders / Instructions: Follow Nutritious Diet and Increase Protein Intake Medications-Please add to medication list.: P.O. Antibiotics - Finish as directed WOUND #1: - Lower Leg Wound Laterality: Left Cleanser: Normal Saline Every Other Day/15 Days Discharge Instructions: Wash your hands with soap and water. Remove old dressing, discard into plastic bag and place into trash. Cleanse the wound with Normal Saline prior to applying a clean dressing using gauze sponges, not tissues or cotton balls. Do not scrub or use excessive force. Pat dry using gauze sponges, not tissue or cotton balls. Cleanser: Soap and Water Every Other Day/15 Days Discharge Instructions: Gently cleanse wound with antibacterial soap, rinse and pat dry prior to dressing wounds Topical: Gentamicin Every Other Day/15 Days Discharge Instructions: In office-Apply as directed by provider. Primary Dressing: Hydrofera Blue Ready Transfer  Foam, 4x5 (in/in) Every Other Day/15 Days Discharge Instructions: Apply Hydrofera Blue Ready to wound bed as directed Secondary Dressing: Mepilex Border Flex, 6x6 (in/in) (Generic) Every Other Day/15 Days Discharge Instructions: Apply to wound as directed. Do not cut. 1. In office sharp debridement 2. Hydrofera Blue 3. Follow-up in 1 week Electronic Signature(s) Signed: 06/08/2021 2:21:45 PM By: Kalman Shan DO Entered By: Kalman Shan on 06/08/2021 14:20:02 Sassano, Arlyce Robbins ([id number]) -------------------------------------------------------------------------------- SuperBill Details Patient Name: Tonya Fail T. Date of Service: 06/08/2021 Medical Record Number: [id number] Patient Account Number: [id number] Date of Birth/Sex: 1935-02-17 (87 y.o. F) Treating RN: [name] Primary Care Provider: [name] Other Clinician: Referring Provider: [name] Treating Provider/Extender: [name] in Treatment: 3 Diagnosis Coding ICD-10 Codes Code Description [phone] Non-pressure chronic ulcer of other part of left lower leg with fat layer exposed S51.002A Unspecified open wound of left elbow, initial encounter L98.491 Non-pressure chronic ulcer of skin of other sites limited to breakdown of skin M31.6 Other giant cell arteritis M81.0 Age-related osteoporosis without current pathological fracture T79.8XXD Other early complications of trauma, subsequent encounter  L03.116 Cellulitis of left lower limb Facility Procedures CPT4 Code: 16109604 Description: 54098 - DEB SUBQ TISSUE 20 SQ CM/< Modifier: Quantity: 1 CPT4 Code: Description: ICD-10 Diagnosis Description L97.822 Non-pressure chronic ulcer of other part of left lower leg with fat layer Modifier: exposed Quantity: Physician Procedures CPT4 Code: 1191478 Description: 29562 - WC PHYS SUBQ TISS 20 SQ CM Modifier: Quantity: 1 CPT4 Code: Description: ICD-10 Diagnosis Description L97.822  Non-pressure chronic ulcer of other part of left lower leg with fat layer Modifier: exposed Quantity: Electronic Signature(s) Signed: 06/08/2021 2:21:45 PM By: Kalman Shan DO Entered By: Kalman Shan on 06/08/2021 14:21:03

## 2021-06-14 ENCOUNTER — Inpatient Hospital Stay: Payer: Medicare Other

## 2021-06-14 ENCOUNTER — Other Ambulatory Visit: Payer: Self-pay

## 2021-06-14 DIAGNOSIS — D708 Other neutropenia: Secondary | ICD-10-CM

## 2021-06-14 LAB — CBC WITH DIFFERENTIAL/PLATELET
Abs Immature Granulocytes: 0.05 10*3/uL (ref 0.00–0.07)
Basophils Absolute: 0 10*3/uL (ref 0.0–0.1)
Basophils Relative: 1 %
Eosinophils Absolute: 0 10*3/uL (ref 0.0–0.5)
Eosinophils Relative: 0 %
HCT: 44.2 % (ref 36.0–46.0)
Hemoglobin: 14.1 g/dL (ref 12.0–15.0)
Immature Granulocytes: 2 %
Lymphocytes Relative: 30 %
Lymphs Abs: 1 10*3/uL (ref 0.7–4.0)
MCH: 29.3 pg (ref 26.0–34.0)
MCHC: 31.9 g/dL (ref 30.0–36.0)
MCV: 91.9 fL (ref 80.0–100.0)
Monocytes Absolute: 0.3 10*3/uL (ref 0.1–1.0)
Monocytes Relative: 8 %
Neutro Abs: 2 10*3/uL (ref 1.7–7.7)
Neutrophils Relative %: 59 %
Platelets: 208 10*3/uL (ref 150–400)
RBC: 4.81 MIL/uL (ref 3.87–5.11)
RDW: 14.8 % (ref 11.5–15.5)
WBC: 3.4 10*3/uL — ABNORMAL LOW (ref 4.0–10.5)
nRBC: 0 % (ref 0.0–0.2)

## 2021-06-15 ENCOUNTER — Encounter (HOSPITAL_BASED_OUTPATIENT_CLINIC_OR_DEPARTMENT_OTHER): Payer: Medicare Other | Admitting: Internal Medicine

## 2021-06-15 DIAGNOSIS — S51002A Unspecified open wound of left elbow, initial encounter: Secondary | ICD-10-CM | POA: Diagnosis not present

## 2021-06-15 DIAGNOSIS — T798XXA Other early complications of trauma, initial encounter: Secondary | ICD-10-CM | POA: Diagnosis not present

## 2021-06-15 DIAGNOSIS — L97822 Non-pressure chronic ulcer of other part of left lower leg with fat layer exposed: Secondary | ICD-10-CM | POA: Diagnosis not present

## 2021-06-15 DIAGNOSIS — M810 Age-related osteoporosis without current pathological fracture: Secondary | ICD-10-CM | POA: Diagnosis not present

## 2021-06-15 DIAGNOSIS — M316 Other giant cell arteritis: Secondary | ICD-10-CM | POA: Diagnosis not present

## 2021-06-15 DIAGNOSIS — L03116 Cellulitis of left lower limb: Secondary | ICD-10-CM | POA: Diagnosis not present

## 2021-06-15 DIAGNOSIS — T798XXD Other early complications of trauma, subsequent encounter: Secondary | ICD-10-CM | POA: Diagnosis not present

## 2021-06-15 DIAGNOSIS — L98491 Non-pressure chronic ulcer of skin of other sites limited to breakdown of skin: Secondary | ICD-10-CM | POA: Diagnosis not present

## 2021-06-15 DIAGNOSIS — S81802A Unspecified open wound, left lower leg, initial encounter: Secondary | ICD-10-CM | POA: Diagnosis not present

## 2021-06-15 DIAGNOSIS — I10 Essential (primary) hypertension: Secondary | ICD-10-CM | POA: Diagnosis not present

## 2021-06-15 NOTE — Progress Notes (Signed)
Tonya, Robbins ([id number]) Visit Report for 06/15/2021 Arrival Information Details Patient Name: [name], [name] Date of Service: 06/15/2021 1:00 PM Medical Record Number: [id number] Patient Account Number: [id number] Date of Birth/Sex: [birth year] (86 y.o. F) Treating RN: [name] Primary Care Jaidee Stipe: [name] Other Clinician: Referring Andreyah Natividad: [name] Treating Makaiyah Schweiger/Extender: [name] in Treatment: 4 Visit Information History Since Last Visit Added or deleted any medications: No Patient Arrived: Ambulatory Had a fall or experienced change in No Arrival Time: 13:10 activities of daily living that may affect Accompanied By: self risk of falls: Transfer Assistance: None Hospitalized since last visit: No Patient Identification Verified: Yes Has Dressing in Place as Prescribed: Yes Secondary Verification Process Completed: Yes Pain Present Now: No Patient Requires Transmission-Based No Precautions: Patient Has Alerts: Yes Patient Alerts: Patient on Blood Thinner 67m Aspirin Electronic Signature(s) Signed: 06/15/2021 4:37:11 PM By: BDonnamarie PoagEntered By: BDonnamarie Poagon 06/15/2021 13:16:03 Larkey, BArlyce Harman([id number] -------------------------------------------------------------------------------- Encounter Discharge Information Details Patient Name: [name] Date of Service: 06/15/2021 1:00 PM Medical Record Number: [id number] Account Number: [id number] of Birth/Sex: [birth year](86 y.o. F) Treating RN: BDonnamarie PoagPrimary Care Gustavo Meditz: JHarrel LemonOther Clinician: Referring Khrista Braun: JHarrel LemonTreating Zalan Shidler/Extender: HYaakov Guthriein Treatment: 4 Encounter Discharge Information Items Post Procedure Vitals Discharge Condition: Stable Temperature (F): 98 Ambulatory Status: Ambulatory Pulse (bpm): 85 Discharge Destination: Home Respiratory Rate (breaths/min):  16 Transportation: Private Auto Blood Pressure (mmHg): 170/75 Accompanied By: self Schedule Follow-up Appointment: Yes Clinical Summary of Care: Electronic Signature(s) Signed: 06/15/2021 4:37:11 PM By: BDonnamarie PoagEntered By: BDonnamarie Poagon 06/15/2021 13:58:24 Eskew, BArlyce Harman([id number] -------------------------------------------------------------------------------- Lower Extremity Assessment Details Patient Name: [name] Date of Service: 06/15/2021 1:00 PM Medical Record Number: [id number] Account Number: [id number] of Birth/Sex: 3Jun 19, 1936(86 y.o. F) Treating RN: BDonnamarie PoagPrimary Care Judi Jaffe: JHarrel LemonOther Clinician: Referring Hien Cunliffe: JHarrel LemonTreating Kenaz Olafson/Extender: HYaakov Guthriein Treatment: 4 Edema Assessment Assessed: [Left: Yes] [Right: Yes] [Left: Edema] [Right: :] Calf Left: Right: Point of Measurement: 34 cm From Medial Instep 40 cm Ankle Left: Right: Point of Measurement: 9 cm From Medial Instep 25 cm Knee To Floor Left: Right: From Medial Instep 46 cm Vascular Assessment Pulses: Dorsalis Pedis Palpable: [Left:Yes] Electronic Signature(s) Signed: 06/15/2021 4:37:11 PM By: BDonnamarie PoagEntered By: BDonnamarie Poagon 06/15/2021 13:25:12 Frankl, BArlyce Harman([id number] -------------------------------------------------------------------------------- Multi Wound Chart Details Patient Name: [name] Date of Service: 06/15/2021 1:00 PM Medical Record Number: [id number] Account Number: [id number] of Birth/Sex: [birth year](86 y.o. F) Treating RN: BDonnamarie PoagPrimary Care Raeford Brandenburg: JHarrel LemonOther Clinician: Referring Karin Pinedo: JHarrel LemonTreating Bekah Igoe/Extender: HYaakov Guthriein Treatment: 4 Vital Signs Height(in): 65 Pulse(bpm): 85 Weight(lbs): 169 Blood Pressure(mmHg): 170/75 Body Mass Index(BMI): 28 Temperature(F): 98.0 Respiratory  Rate(breaths/min): 16 Photos: [N/A:N/A] Wound Location: Left Lower Leg N/A N/A Wounding Event: Skin Tear/Laceration N/A N/A Primary Etiology: Trauma, Other N/A N/A Comorbid History: Coronary Artery Disease, N/A N/A Neuropathy Date Acquired: 05/16/2021 N/A N/A Weeks of Treatment: 4 N/A N/A Wound Status: Open N/A N/A Measurements L x W x D (cm) 1x0.5x0.1 N/A N/A Area (cm) : 0.393 N/A N/A Volume (cm) : 0.039 N/A N/A % Reduction in Area: 98.60% N/A N/A % Reduction in Volume: 98.60% N/A N/A Classification: Full Thickness Without Exposed N/A N/A Support Structures Exudate Amount: Medium N/A N/A Exudate Type: Serosanguineous N/A N/A Exudate Color: red, brown N/A N/A Granulation Amount: Large (67-100%) N/A N/A Granulation Quality:  Red, Pink, Hyper-granulation N/A N/A Necrotic Amount: Small (1-33%) N/A N/A Exposed Structures: Fat Layer (Subcutaneous Tissue): N/A N/A Yes Fascia: No Tendon: No Muscle: No Joint: No Bone: No Treatment Notes Electronic Signature(s) Signed: 06/15/2021 4:37:11 PM By: [name] Entered By: [name] on 06/15/2021 13:27:21 Gilden, [name] ([id number]) -------------------------------------------------------------------------------- [place] Details Patient Name: [name] Date of Service: 06/15/2021 1:00 PM Medical Record Number: [id number] Patient Account Number: [id number] Date of Birth/Sex: [birth year] (86 y.o. F) Treating RN: [name] Primary Care Noga Fogg: [name] Other Clinician: Referring Germani Gavilanes: [name] Treating Jerral Mccauley/Extender: [name] in Treatment: 4 Active Inactive Wound/Skin Impairment Nursing Diagnoses: Impaired tissue integrity Knowledge deficit related to smoking impact on wound healing Knowledge deficit related to ulceration/compromised skin integrity Goals: Patient/caregiver will verbalize understanding of skin care regimen Date Initiated: 05/18/2021 Date  Inactivated: 05/25/2021 Target Resolution Date: 05/27/2021 Goal Status: Met Ulcer/skin breakdown will have a volume reduction of 30% by week 4 Date Initiated: 05/18/2021 Date Inactivated: 06/08/2021 Target Resolution Date: 06/13/2021 Goal Status: Met Ulcer/skin breakdown will have a volume reduction of 50% by week 8 Date Initiated: 05/18/2021 Date Inactivated: 06/15/2021 Target Resolution Date: 07/11/2021 Goal Status: Met Ulcer/skin breakdown will have a volume reduction of 80% by week 12 Date Initiated: 05/18/2021 Date Inactivated: 06/15/2021 Target Resolution Date: 08/08/2021 Goal Status: Met Ulcer/skin breakdown will heal within 14 weeks Date Initiated: 05/18/2021 Target Resolution Date: 08/22/2021 Goal Status: Active Interventions: Assess patient/caregiver ability to obtain necessary supplies Assess patient/caregiver ability to perform ulcer/skin care regimen upon admission and as needed Assess ulceration(s) every visit Notes: Electronic Signature(s) Signed: 06/15/2021 4:37:11 PM By: [name] Entered By: [name] on 06/15/2021 13:25:25 Palardy, [name] ([id number]) -------------------------------------------------------------------------------- Pain Assessment Details Patient Name: [name] Date of Service: 06/15/2021 1:00 PM Medical Record Number: [id number] Patient Account Number: [id number] Date of Birth/Sex: [birth year] (86 y.o. F) Treating RN: [name] Primary Care Trindon Dorton: [name] Other Clinician: Referring Aleynah Rocchio: [name] Treating Mia Winthrop/Extender: [name] in Treatment: 4 Active Problems Location of Pain Severity and Description of Pain Patient Has Paino No Site Locations Rate the pain. Current Pain Level: 0 Pain Management and Medication Current Pain Management: Electronic Signature(s) Signed: 06/15/2021 4:37:11 PM By: [name] Entered By: [name] on 06/15/2021 13:16:52 Bayles, [name]  ([id number]) -------------------------------------------------------------------------------- Patient/Caregiver Education Details Patient Name: [name] Date of Service: 06/15/2021 1:00 PM Medical Record Number: [id number] Patient Account Number: [id number] Date of Birth/Gender: August 13, 1934 (87 y.o. F) Treating RN: [name] Primary Care Physician: [name] Other Clinician: Referring Physician: [name] Treating Physician/Extender: [name] in Treatment: 4 Education Assessment Education Provided To: Patient Education Topics Provided Basic Hygiene: Wound/Skin Impairment: Electronic Signature(s) Signed: 06/15/2021 4:37:11 PM By: [name] Entered By: [name] on 06/15/2021 13:56:37 Georg, [name] ([id number]) -------------------------------------------------------------------------------- Wound Assessment Details Patient Name: [name] Date of Service: 06/15/2021 1:00 PM Medical Record Number: [id number] Patient Account Number: [id number] Date of Birth/Sex: 06-20-1934 (86 y.o. F) Treating RN: [name] Primary Care Tiphani Mells: [name] Other Clinician: Referring Nyimah Shadduck: [name] Treating Dodi Leu/Extender: [name] in Treatment: 4 Wound Status Wound Number: 1 Primary Etiology: Trauma, Other Wound Location: Left Lower Leg Wound Status: Open Wounding Event: Skin Tear/Laceration Comorbid History: Coronary Artery Disease, Neuropathy Date Acquired: 05/16/2021 Weeks Of Treatment: 4 Clustered Wound: No Photos Wound Measurements Length: (cm) 1 Width: (cm) 0.5 Depth: (cm) 0.1 Area: (cm) 0.393 Volume: (cm) 0.039 % Reduction in Area: 98.6% % Reduction in Volume: 98.6%  Tunneling: No Undermining: No Wound Description Classification: Full Thickness Without Exposed Support Structures Exudate Amount: Medium Exudate Type: Serosanguineous Exudate Color: red, brown Foul Odor After Cleansing:  No Slough/Fibrino Yes Wound Bed Granulation Amount: Large (67-100%) Exposed Structure Granulation Quality: Red, Pink, Hyper-granulation Fascia Exposed: No Necrotic Amount: Small (1-33%) Fat Layer (Subcutaneous Tissue) Exposed: Yes Necrotic Quality: Adherent Slough Tendon Exposed: No Muscle Exposed: No Joint Exposed: No Bone Exposed: No Treatment Notes Wound #1 (Lower Leg) Wound Laterality: Left Cleanser Normal Saline Discharge Instruction: Wash your hands with soap and water. Remove old dressing, discard into plastic bag and place into trash. Cleanse the wound with Normal Saline prior to applying a clean dressing using gauze sponges, not tissues or cotton balls. Do not scrub or use excessive force. Pat dry using gauze sponges, not tissue or cotton balls. Soap and Water Danielski, Thornton Robbins. ([id number]) Discharge Instruction: Gently cleanse wound with antibacterial soap, rinse and pat dry prior to dressing wounds Peri-Wound Care Topical Gentamicin Discharge Instruction: In office-Apply as directed by Elecia Serafin. Primary Dressing Hydrofera Blue Ready Transfer Foam, 4x5 (in/in) Discharge Instruction: Apply Hydrofera Blue Ready to wound bed as directed Secondary Dressing Zetuvit Plus Silicone Border Dressing 4x4 (in/in) Secured With Compression Wrap Compression Stockings Add-Ons Electronic Signature(s) Signed: 06/15/2021 4:37:11 PM By: [name] Entered By: [name] on 06/15/2021 13:26:12 Nath, [name] ([id number]) -------------------------------------------------------------------------------- Vitals Details Patient Name: [name] Date of Service: 06/15/2021 1:00 PM Medical Record Number: [id number] Patient Account Number: [id number] Date of Birth/Sex: [birth year] (86 y.o. F) Treating RN: [name] Primary Care Patte Winkel: [name] Other Clinician: Referring Jaidence Geisler: [name] Treating Nicanor Mendolia/Extender: [name] in Treatment:  4 Vital Signs Time Taken: 15:15 Temperature (F): 98.0 Height (in): 65 Pulse (bpm): 85 Weight (lbs): 169 Respiratory Rate (breaths/min): 16 Body Mass Index (BMI): 28.1 Blood Pressure (mmHg): 170/75 Reference Range: 80 - 120 mg / dl Electronic Signature(s) Signed: 06/15/2021 4:37:11 PM By: [name] Entered ByDonnamarie Poag on 06/15/2021 13:16:43

## 2021-06-15 NOTE — Progress Notes (Signed)
Tonya, Robbins ([id number]) Visit Report for 06/15/2021 Chief Complaint Document Details Patient Name: [name], [name] Date of Service: 06/15/2021 1:00 PM Medical Record Number: [id number] Patient Account Number: [id number] Date of Birth/Sex: February 21, 1935 (87 y.o. F) Treating RN: [name] Primary Care Provider: [name] Other Clinician: Referring Provider: [name] Treating Provider/Extender: [name] in Treatment: 4 Information Obtained from: Patient Chief Complaint Left lower extremity and left elbow wounds following a fall Electronic Signature(s) Signed: 06/15/2021 2:47:07 PM By: Kalman Shan DO Entered By: Kalman Shan on 06/15/2021 14:44:07 Tamura, [name] ([id number]) -------------------------------------------------------------------------------- Debridement Details Patient Name: Tonya Fail T. Date of Service: 06/15/2021 1:00 PM Medical Record Number: [id number] Patient Account Number: [id number] Date of Birth/Sex: 1935-04-03 (87 y.o. F) Treating RN: [name] Primary Care Provider: [name] Other Clinician: Referring Provider: [name] Treating Provider/Extender: [name] in Treatment: 4 Debridement Performed for Wound #1 Left Lower Leg Assessment: Performed By: Physician Kalman Shan, MD Debridement Type: Debridement Level of Consciousness (Pre- Awake and Alert procedure): Pre-procedure Verification/Time Out Yes - 13:40 Taken: Start Time: 13:41 Pain Control: Lidocaine Total Area Debrided (L x W): 1 (cm) x 0.5 (cm) = 0.5 (cm) Tissue and other material Viable, Non-Viable, Slough, Subcutaneous, Slough debrided: Level: Skin/Subcutaneous Tissue Debridement Description: Excisional Instrument: Curette Bleeding: Minimum Hemostasis Achieved: Pressure Response to Treatment: Procedure was tolerated well Level of Consciousness (Post- Awake and Alert procedure): Post Debridement  Measurements of Total Wound Length: (cm) 1 Width: (cm) 0.5 Depth: (cm) 0.1 Volume: (cm) 0.039 Character of Wound/Ulcer Post Debridement: Improved Post Procedure Diagnosis Same as Pre-procedure Electronic Signature(s) Signed: 06/15/2021 2:47:07 PM By: Kalman Shan DO Signed: 06/15/2021 4:37:11 PM By: [name] Entered By: [name] on 06/15/2021 13:42:18 Zani, Chaeli T. ([id number]) -------------------------------------------------------------------------------- HPI Details Patient Name: Tonya Fail T. Date of Service: 06/15/2021 1:00 PM Medical Record Number: [id number] Patient Account Number: [id number] Date of Birth/Sex: [birth year] (87 y.o. F) Treating RN: [name] Primary Care Provider: [name] Other Clinician: Referring Provider: [name] Treating Provider/Extender: [name] in Treatment: 4 History of Present Illness HPI Description: Admission 05/18/2021 [name] is an 87 year old female with a past medical history of osteoporosis, temporal arteritis and hypertension that presents to the clinic for a left lower extremity wound and left elbow wound due to a fall. She states she went to the attic to retrieve an item and she fell and injured her left side. She visited dermatology for this issue and was referred to our clinic. She is currently been keeping the area covered. She currently denies signs of infection. She reports mild tenderness to the wound beds. 12/28; patient presents for follow-up. She has been using Hydrofera Blue to the wound beds. She has a scab to the left elbow wound. She has no issues or complaints today. She currently denies signs of infection. 1/4; patient presents for follow-up. She has been using Hydrofera blue with dressing changes. She reports more pain to the wound bed in the past week. She denies purulent drainage. 1/11; patient presents for follow-up. She reports improvement in her wound  healing. She is still taking the antibiotic prescribed at last clinic visit. She has been using Hydrofera Blue to the wound bed. She has no issues or complaints today. 1/18; patient presents for follow-up. She has been using Hydrofera Blue with dressing changes. She reports improvement in wound healing. Electronic Signature(s) Signed: 06/15/2021 2:47:07 PM By: Kalman Shan DO Entered By: Kalman Shan on 06/15/2021 14:44:30 Farfan, [name] ([id number]) --------------------------------------------------------------------------------  Physical Exam Details Patient Name: Tonya, [name] Date of Service: 06/15/2021 1:00 PM Medical Record Number: [id number] Patient Account Number: [id number] Date of Birth/Sex: [birth year] (87 y.o. F) Treating RN: [name] Primary Care Provider: [name] Other Clinician: Referring Provider: [name] Treating Provider/Extender: [name] in Treatment: 4 Constitutional . Cardiovascular . Psychiatric . Notes Left lower extremity: Open wound with nonviable tissue and mostly granulation tissue present. Epithelialization occurring throughout the wound bed. No signs of infection. Electronic Signature(s) Signed: 06/15/2021 2:47:07 PM By: Kalman Shan DO Entered By: Kalman Shan on 06/15/2021 14:44:56 Mceachron, [name] ([id number]) -------------------------------------------------------------------------------- Physician Orders Details Patient Name: Tonya Fail T. Date of Service: 06/15/2021 1:00 PM Medical Record Number: [id number] Patient Account Number: [id number] Date of Birth/Sex: 1934-11-16 (87 y.o. F) Treating RN: [name] Primary Care Provider: [name] Other Clinician: Referring Provider: [name] Treating Provider/Extender: [name] in Treatment: 4 Verbal / Phone Orders: No Diagnosis Coding Follow-up Appointments o Return Appointment in 1 week. o Nurse  Visit as needed Bathing/ Shower/ Hygiene o Clean wound with Normal Saline or wound cleanser. o May shower; gently cleanse wound with antibacterial soap, rinse and pat dry prior to dressing wounds o No tub bath. Anesthetic (Use 'Patient Medications' Section for Anesthetic Order Entry) o Lidocaine applied to wound bed Edema Control - Lymphedema / Segmental Compressive Device / Other o Patient to wear own compression stockings. Remove compression stockings every night before going to bed and put on every morning when getting up. o Elevate, Exercise Daily and Avoid Standing for Long Periods of Time. o Elevate leg(s) parallel to the floor when sitting. o DO YOUR BEST to sleep in the bed at night. DO NOT sleep in your recliner. Long hours of sitting in a recliner leads to swelling of the legs and/or potential wounds on your backside. Additional Orders / Instructions o Follow Nutritious Diet and Increase Protein Intake o Other: - Use AandD or vaseline type skin protectant to the skin that is nearly healed-recommend cover newly healed skin for protection Wound Treatment Wound #1 - Lower Leg Wound Laterality: Left Cleanser: Normal Saline Every Other Day/15 Days Discharge Instructions: Wash your hands with soap and water. Remove old dressing, discard into plastic bag and place into trash. Cleanse the wound with Normal Saline prior to applying a clean dressing using gauze sponges, not tissues or cotton balls. Do not scrub or use excessive force. Pat dry using gauze sponges, not tissue or cotton balls. Cleanser: Soap and Water Every Other Day/15 Days Discharge Instructions: Gently cleanse wound with antibacterial soap, rinse and pat dry prior to dressing wounds Topical: Gentamicin Every Other Day/15 Days Discharge Instructions: In office-Apply as directed by provider. Primary Dressing: Hydrofera Blue Ready Transfer Foam, 4x5 (in/in) Every Other Day/15 Days Discharge Instructions:  Apply Hydrofera Blue Ready to wound bed as directed Secondary Dressing: Zetuvit Plus Silicone Border Dressing 4x4 (in/in) (DME) (Generic) Every Other Day/15 Days Electronic Signature(s) Signed: 06/15/2021 2:47:07 PM By: Kalman Shan DO Entered By: Kalman Shan on 06/15/2021 14:46:32 Knab, [name] ([id number]) -------------------------------------------------------------------------------- Problem List Details Patient Name: Tonya Fail T. Date of Service: 06/15/2021 1:00 PM Medical Record Number: [id number] Patient Account Number: [id number] Date of Birth/Sex: March 26, 1935 (87 y.o. F) Treating RN: [name] Primary Care Provider: [name] Other Clinician: Referring Provider: [name] Treating Provider/Extender: [name] in Treatment: 4 Active Problems ICD-10 Encounter Code Description Active Date MDM Diagnosis [phone] Non-pressure chronic ulcer of other part of left lower leg with fat  layer 05/18/2021 No Yes exposed S51.002A Unspecified open wound of left elbow, initial encounter 05/18/2021 No Yes L98.491 Non-pressure chronic ulcer of skin of other sites limited to breakdown of 05/18/2021 No Yes skin M31.6 Other giant cell arteritis 05/18/2021 No Yes M81.0 Age-related osteoporosis without current pathological fracture 05/18/2021 No Yes T79.8XXD Other early complications of trauma, subsequent encounter 05/18/2021 No Yes L03.116 Cellulitis of left lower limb 06/01/2021 No Yes Inactive Problems Resolved Problems Electronic Signature(s) Signed: 06/15/2021 2:47:07 PM By: Kalman Shan DO Entered By: Kalman Shan on 06/15/2021 14:13:59 Jallow, [name] ([id number]) -------------------------------------------------------------------------------- Progress Note Details Patient Name: Tonya Fail T. Date of Service: 06/15/2021 1:00 PM Medical Record Number: [id number] Patient Account Number: [id number] Date of Birth/Sex: 07-13-1934  (87 y.o. F) Treating RN: [name] Primary Care Provider: [name] Other Clinician: Referring Provider: [name] Treating Provider/Extender: [name] in Treatment: 4 Subjective Chief Complaint Information obtained from Patient Left lower extremity and left elbow wounds following a fall History of Present Illness (HPI) Admission 05/18/2021 Ms. Liller Yohn is an 87 year old female with a past medical history of osteoporosis, temporal arteritis and hypertension that presents to the clinic for a left lower extremity wound and left elbow wound due to a fall. She states she went to the attic to retrieve an item and she fell and injured her left side. She visited dermatology for this issue and was referred to our clinic. She is currently been keeping the area covered. She currently denies signs of infection. She reports mild tenderness to the wound beds. 12/28; patient presents for follow-up. She has been using Hydrofera Blue to the wound beds. She has a scab to the left elbow wound. She has no issues or complaints today. She currently denies signs of infection. 1/4; patient presents for follow-up. She has been using Hydrofera blue with dressing changes. She reports more pain to the wound bed in the past week. She denies purulent drainage. 1/11; patient presents for follow-up. She reports improvement in her wound healing. She is still taking the antibiotic prescribed at last clinic visit. She has been using Hydrofera Blue to the wound bed. She has no issues or complaints today. 1/18; patient presents for follow-up. She has been using Hydrofera Blue with dressing changes. She reports improvement in wound healing. Objective Constitutional Vitals Time Taken: 3:15 PM, Height: 65 in, Weight: 169 lbs, BMI: 28.1, Temperature: 98.0 F, Pulse: 85 bpm, Respiratory Rate: 16 breaths/min, Blood Pressure: 170/75 mmHg. General Notes: Left lower extremity: Open wound with  nonviable tissue and mostly granulation tissue present. Epithelialization occurring throughout the wound bed. No signs of infection. Integumentary (Hair, Skin) Wound #1 status is Open. Original cause of wound was Skin Tear/Laceration. The date acquired was: 05/16/2021. The wound has been in treatment 4 weeks. The wound is located on the Left Lower Leg. The wound measures 1cm length x 0.5cm width x 0.1cm depth; 0.393cm^2 area and 0.039cm^3 volume. There is Fat Layer (Subcutaneous Tissue) exposed. There is no tunneling or undermining noted. There is a medium amount of serosanguineous drainage noted. There is large (67-100%) red, pink, hyper - granulation within the wound bed. There is a small (1-33%) amount of necrotic tissue within the wound bed including Adherent Slough. Assessment Active Problems ICD-10 Non-pressure chronic ulcer of other part of left lower leg with fat layer exposed Unspecified open wound of left elbow, initial encounter Non-pressure chronic ulcer of skin of other sites limited to breakdown of skin Other giant cell arteritis Age-related osteoporosis without current pathological  fracture Other early complications of trauma, subsequent encounter Lizarraga, Cristy T. ([id number]) Cellulitis of left lower limb Patient's wound has shown improvement in size and appearance since last clinic visit. I debrided nonviable tissue. I recommended continuing with Hydrofera Blue. Follow-up in 1 week. Procedures Wound #1 Pre-procedure diagnosis of Wound #1 is a Trauma, Other located on the Left Lower Leg . There was a Excisional Skin/Subcutaneous Tissue Debridement with a total area of 0.5 sq cm performed by Kalman Shan, MD. With the following instrument(s): Curette to remove Viable and Non-Viable tissue/material. Material removed includes Subcutaneous Tissue and Slough and after achieving pain control using Lidocaine. A time out was conducted at 13:40, prior to the start of the  procedure. A Minimum amount of bleeding was controlled with Pressure. The procedure was tolerated well. Post Debridement Measurements: 1cm length x 0.5cm width x 0.1cm depth; 0.039cm^3 volume. Character of Wound/Ulcer Post Debridement is improved. Post procedure Diagnosis Wound #1: Same as Pre-Procedure Plan Follow-up Appointments: Return Appointment in 1 week. Nurse Visit as needed Bathing/ Shower/ Hygiene: Clean wound with Normal Saline or wound cleanser. May shower; gently cleanse wound with antibacterial soap, rinse and pat dry prior to dressing wounds No tub bath. Anesthetic (Use 'Patient Medications' Section for Anesthetic Order Entry): Lidocaine applied to wound bed Edema Control - Lymphedema / Segmental Compressive Device / Other: Patient to wear own compression stockings. Remove compression stockings every night before going to bed and put on every morning when getting up. Elevate, Exercise Daily and Avoid Standing for Long Periods of Time. Elevate leg(s) parallel to the floor when sitting. DO YOUR BEST to sleep in the bed at night. DO NOT sleep in your recliner. Long hours of sitting in a recliner leads to swelling of the legs and/or potential wounds on your backside. Additional Orders / Instructions: Follow Nutritious Diet and Increase Protein Intake Other: - Use AandD or vaseline type skin protectant to the skin that is nearly healed-recommend cover newly healed skin for protection WOUND #1: - Lower Leg Wound Laterality: Left Cleanser: Normal Saline Every Other Day/15 Days Discharge Instructions: Wash your hands with soap and water. Remove old dressing, discard into plastic bag and place into trash. Cleanse the wound with Normal Saline prior to applying a clean dressing using gauze sponges, not tissues or cotton balls. Do not scrub or use excessive force. Pat dry using gauze sponges, not tissue or cotton balls. Cleanser: Soap and Water Every Other Day/15 Days Discharge  Instructions: Gently cleanse wound with antibacterial soap, rinse and pat dry prior to dressing wounds Topical: Gentamicin Every Other Day/15 Days Discharge Instructions: In office-Apply as directed by provider. Primary Dressing: Hydrofera Blue Ready Transfer Foam, 4x5 (in/in) Every Other Day/15 Days Discharge Instructions: Apply Hydrofera Blue Ready to wound bed as directed Secondary Dressing: Zetuvit Plus Silicone Border Dressing 4x4 (in/in) (DME) (Generic) Every Other Day/15 Days 1. Hydrofera Blue 2. In office sharp debridement 3. Follow-up in 1 week Electronic Signature(s) Signed: 06/15/2021 2:47:07 PM By: Kalman Shan DO Entered By: Kalman Shan on 06/15/2021 14:46:00 Brisk, [name] ([id number]) -------------------------------------------------------------------------------- SuperBill Details Patient Name: Tonya Fail T. Date of Service: 06/15/2021 Medical Record Number: [id number] Patient Account Number: [id number] Date of Birth/Sex: 1934-11-26 (87 y.o. F) Treating RN: [name] Primary Care Provider: [name] Other Clinician: Referring Provider: [name] Treating Provider/Extender: [name] in Treatment: 4 Diagnosis Coding ICD-10 Codes Code Description [id number] Non-pressure chronic ulcer of other part of left lower leg with fat layer exposed S51.002A Unspecified open wound  of left elbow, initial encounter L98.491 Non-pressure chronic ulcer of skin of other sites limited to breakdown of skin M31.6 Other giant cell arteritis M81.0 Age-related osteoporosis without current pathological fracture T79.8XXD Other early complications of trauma, subsequent encounter L03.116 Cellulitis of left lower limb Facility Procedures CPT4 Code: [id number] Description: 59292 - DEB SUBQ TISSUE 20 SQ CM/< Modifier: Quantity: 1 CPT4 Code: Description: ICD-10 Diagnosis Description L97.822 Non-pressure chronic ulcer of other part of left lower leg with  fat layer Modifier: exposed Quantity: Physician Procedures CPT4 Code: [id number] Description: 11042 - WC PHYS SUBQ TISS 20 SQ CM Modifier: Quantity: 1 CPT4 Code: Description: ICD-10 Diagnosis Description L97.822 Non-pressure chronic ulcer of other part of left lower leg with fat layer Modifier: exposed Quantity: Electronic Signature(s) Signed: 06/15/2021 2:47:07 PM By: Kalman Shan DO Entered By: Kalman Shan on 06/15/2021 14:46:13

## 2021-06-21 ENCOUNTER — Inpatient Hospital Stay: Payer: Medicare Other

## 2021-06-21 ENCOUNTER — Other Ambulatory Visit: Payer: Self-pay

## 2021-06-21 DIAGNOSIS — D708 Other neutropenia: Secondary | ICD-10-CM

## 2021-06-21 DIAGNOSIS — D72819 Decreased white blood cell count, unspecified: Secondary | ICD-10-CM

## 2021-06-21 LAB — CBC WITH DIFFERENTIAL/PLATELET
Abs Immature Granulocytes: 0.01 10*3/uL (ref 0.00–0.07)
Basophils Absolute: 0 10*3/uL (ref 0.0–0.1)
Basophils Relative: 1 %
Eosinophils Absolute: 0 10*3/uL (ref 0.0–0.5)
Eosinophils Relative: 1 %
HCT: 44.8 % (ref 36.0–46.0)
Hemoglobin: 14 g/dL (ref 12.0–15.0)
Immature Granulocytes: 1 %
Lymphocytes Relative: 62 %
Lymphs Abs: 1.4 10*3/uL (ref 0.7–4.0)
MCH: 29.1 pg (ref 26.0–34.0)
MCHC: 31.3 g/dL (ref 30.0–36.0)
MCV: 93.1 fL (ref 80.0–100.0)
Monocytes Absolute: 0.2 10*3/uL (ref 0.1–1.0)
Monocytes Relative: 11 %
Neutro Abs: 0.5 10*3/uL — ABNORMAL LOW (ref 1.7–7.7)
Neutrophils Relative %: 24 %
Platelets: 212 10*3/uL (ref 150–400)
RBC: 4.81 MIL/uL (ref 3.87–5.11)
RDW: 14.8 % (ref 11.5–15.5)
WBC: 2.2 10*3/uL — ABNORMAL LOW (ref 4.0–10.5)
nRBC: 0 % (ref 0.0–0.2)

## 2021-06-21 MED ORDER — FILGRASTIM-SNDZ 480 MCG/0.8ML IJ SOSY
480.0000 ug | PREFILLED_SYRINGE | Freq: Once | INTRAMUSCULAR | Status: AC
  Administered 2021-06-21: 11:00:00 480 ug via SUBCUTANEOUS
  Filled 2021-06-21: qty 0.8

## 2021-06-22 ENCOUNTER — Encounter (HOSPITAL_BASED_OUTPATIENT_CLINIC_OR_DEPARTMENT_OTHER): Payer: Medicare Other | Admitting: Internal Medicine

## 2021-06-22 DIAGNOSIS — L98491 Non-pressure chronic ulcer of skin of other sites limited to breakdown of skin: Secondary | ICD-10-CM | POA: Diagnosis not present

## 2021-06-22 DIAGNOSIS — L03116 Cellulitis of left lower limb: Secondary | ICD-10-CM | POA: Diagnosis not present

## 2021-06-22 DIAGNOSIS — L97822 Non-pressure chronic ulcer of other part of left lower leg with fat layer exposed: Secondary | ICD-10-CM

## 2021-06-22 DIAGNOSIS — M316 Other giant cell arteritis: Secondary | ICD-10-CM | POA: Diagnosis not present

## 2021-06-22 DIAGNOSIS — T798XXD Other early complications of trauma, subsequent encounter: Secondary | ICD-10-CM | POA: Diagnosis not present

## 2021-06-22 DIAGNOSIS — T798XXA Other early complications of trauma, initial encounter: Secondary | ICD-10-CM | POA: Diagnosis not present

## 2021-06-22 DIAGNOSIS — I10 Essential (primary) hypertension: Secondary | ICD-10-CM | POA: Diagnosis not present

## 2021-06-22 DIAGNOSIS — M810 Age-related osteoporosis without current pathological fracture: Secondary | ICD-10-CM | POA: Diagnosis not present

## 2021-06-22 NOTE — Progress Notes (Signed)
[name] ([id number]) Visit Report for 06/22/2021 Arrival Information Details Patient Name: [name] Date of Service: 06/22/2021 1:00 PM Medical Record Number: [id number] Patient Account Number: [id number] Date of Birth/Sex: Aug 04, 1934 (87 y.o. F) Treating RN: [name] Primary Care Tan Clopper: [name] Other Clinician: Referring Chiniqua Kilcrease: [name] Treating Muneer Leider/Extender: [name] in Treatment: 5 Visit Information History Since Last Visit Added or deleted any medications: No Patient Arrived: Ambulatory Had a fall or experienced change in No Arrival Time: 13:04 activities of daily living that may affect Accompanied By: self risk of falls: Transfer Assistance: None Hospitalized since last visit: No Patient Identification Verified: Yes Has Dressing in Place as Prescribed: Yes Secondary Verification Process Completed: Yes Pain Present Now: No Patient Requires Transmission-Based No Precautions: Patient Has Alerts: Yes Patient Alerts: Patient on Blood Thinner 85mg  Aspirin Electronic Signature(s) Signed: 06/22/2021 1:37:32 PM By: [name] Entered By: [name] on 06/22/2021 13:08:38 [name], Arlyce Robbins ([id number]) -------------------------------------------------------------------------------- Clinic Level of Care Assessment Details Patient Name: Tonya Fail T. Date of Service: 06/22/2021 1:00 PM Medical Record Number: [id number] Patient Account Number: [id number] Date of Birth/Sex: 02-23-1935 (87 y.o. F) Treating RN: [name] Primary Care Aquan Kope: [name] Other Clinician: Referring Reham Slabaugh: [name] Treating Yigit Norkus/Extender: [name] in Treatment: 5 Clinic Level of Care Assessment Items TOOL 4 Quantity Score []  - Use when only an EandM is performed on FOLLOW-UP visit 0 ASSESSMENTS - Nursing Assessment / Reassessment []  - Reassessment of Co-morbidities (includes updates in  patient status) 0 []  - 0 Reassessment of Adherence to Treatment Plan ASSESSMENTS - Wound and Skin Assessment / Reassessment X - Simple Wound Assessment / Reassessment - one wound 1 5 []  - 0 Complex Wound Assessment / Reassessment - multiple wounds []  - 0 Dermatologic / Skin Assessment (not related to wound area) ASSESSMENTS - Focused Assessment []  - Circumferential Edema Measurements - multi extremities 0 []  - 0 Nutritional Assessment / Counseling / Intervention []  - 0 Lower Extremity Assessment (monofilament, tuning fork, pulses) []  - 0 Peripheral Arterial Disease Assessment (using hand held doppler) ASSESSMENTS - Ostomy and/or Continence Assessment and Care []  - Incontinence Assessment and Management 0 []  - 0 Ostomy Care Assessment and Management (repouching, etc.) PROCESS - Coordination of Care X - Simple Patient / Family Education for ongoing care 1 15 []  - 0 Complex (extensive) Patient / Family Education for ongoing care []  - 0 Staff obtains Programmer, systems, Records, Test Results / Process Orders []  - 0 Staff telephones HHA, Nursing Homes / Clarify orders / etc []  - 0 Routine Transfer to another Facility (non-emergent condition) []  - 0 Routine Hospital Admission (non-emergent condition) []  - 0 New Admissions / Biomedical engineer / Ordering NPWT, Apligraf, etc. []  - 0 Emergency Hospital Admission (emergent condition) X- 1 10 Simple Discharge Coordination []  - 0 Complex (extensive) Discharge Coordination PROCESS - Special Needs []  - Pediatric / Minor Patient Management 0 []  - 0 Isolation Patient Management []  - 0 Hearing / Language / Visual special needs []  - 0 Assessment of Community assistance (transportation, D/C planning, etc.) []  - 0 Additional assistance / Altered mentation []  - 0 Support Surface(s) Assessment (bed, cushion, seat, etc.) INTERVENTIONS - Wound Cleansing / Measurement [name], [name] ([id number]) X- 1 5 Simple Wound Cleansing - one  wound []  - 0 Complex Wound Cleansing - multiple wounds X- 1 5 Wound Imaging (photographs - any number of wounds) []  - 0 Wound Tracing (instead of photographs) X- 1 5 Simple Wound Measurement - one  wound []  - 0 Complex Wound Measurement - multiple wounds INTERVENTIONS - Wound Dressings X - Small Wound Dressing one or multiple wounds 1 10 []  - 0 Medium Wound Dressing one or multiple wounds []  - 0 Large Wound Dressing one or multiple wounds X- 1 5 Application of Medications - topical []  - 0 Application of Medications - injection INTERVENTIONS - Miscellaneous []  - External ear exam 0 []  - 0 Specimen Collection (cultures, biopsies, blood, body fluids, etc.) []  - 0 Specimen(s) / Culture(s) sent or taken to Lab for analysis []  - 0 Patient Transfer (multiple staff / Civil Service fast streamer / Similar devices) []  - 0 Simple Staple / Suture removal (25 or less) []  - 0 Complex Staple / Suture removal (26 or more) []  - 0 Hypo / Hyperglycemic Management (close monitor of Blood Glucose) []  - 0 Ankle / Brachial Index (ABI) - do not check if billed separately X- 1 5 Vital Signs Has the patient been seen at the hospital within the last three years: Yes Total Score: 65 Level Of Care: New/Established - Level 2 Electronic Signature(s) Signed: 06/22/2021 1:37:32 PM By: [name] Entered By: [name] on 06/22/2021 13:25:27 [name] ([id number]) -------------------------------------------------------------------------------- Encounter Discharge Information Details Patient Name: Tonya Fail T. Date of Service: 06/22/2021 1:00 PM Medical Record Number: [id number] Patient Account Number: [id number] Date of Birth/Sex: 06-09-1934 (87 y.o. F) Treating RN: [name] Primary Care Raden Byington: [name] Other Clinician: Referring Kazia Grisanti: [name] Treating Shrihan Putt/Extender: [name] in Treatment: 5 Encounter Discharge Information Items Discharge Condition:  Stable Ambulatory Status: Ambulatory Discharge Destination: Home Transportation: Private Auto Accompanied By: self Schedule Follow-up Appointment: Yes Clinical Summary of Care: Electronic Signature(s) Signed: 06/22/2021 1:37:32 PM By: [name] Entered By: [name] on 06/22/2021 13:28:29 Brooke, Arlyce Robbins ([id number]) -------------------------------------------------------------------------------- Lower Extremity Assessment Details Patient Name: Tonya Fail T. Date of Service: 06/22/2021 1:00 PM Medical Record Number: [id number] Patient Account Number: [id number] Date of Birth/Sex: Oct 03, 1934 (87 y.o. F) Treating RN: [name] Primary Care Sasuke Yaffe: [name] Other Clinician: Referring Sadae Arrazola: [name] Treating Serah Nicoletti/Extender: [name] in Treatment: 5 Edema Assessment Assessed: [Left: Yes] [Right: No] Edema: [Left: N] [Right: o] Calf Left: Right: Point of Measurement: 34 cm From Medial Instep 40 cm Ankle Left: Right: Point of Measurement: 9 cm From Medial Instep 25 cm Knee To Floor Left: Right: From Medial Instep 46 cm Vascular Assessment Pulses: Dorsalis Pedis Palpable: [Left:Yes] Electronic Signature(s) Signed: 06/22/2021 1:37:32 PM By: [name] Entered By: [name] on 06/22/2021 13:14:37 Amble, Arlyce Robbins ([id number]) -------------------------------------------------------------------------------- Multi Wound Chart Details Patient Name: Tonya Fail T. Date of Service: 06/22/2021 1:00 PM Medical Record Number: [id number] Patient Account Number: [id number] Date of Birth/Sex: May 09, 1935 (87 y.o. F) Treating RN: [name] Primary Care Keidy Thurgood: [name] Other Clinician: Referring Adahlia Stembridge: [name] Treating Baltazar Pekala/Extender: [name] in Treatment: 5 Vital Signs Height(in): 65 Pulse(bpm): 106 Weight(lbs): 169 Blood Pressure(mmHg): 177/75 Body Mass Index(BMI):  28.1 Temperature(F): 97.8 Respiratory Rate(breaths/min): 16 Photos: [N/A:N/A] Wound Location: Left Lower Leg N/A N/A Wounding Event: Skin Tear/Laceration N/A N/A Primary Etiology: Trauma, Other N/A N/A Comorbid History: Coronary Artery Disease, N/A N/A Neuropathy Date Acquired: 05/16/2021 N/A N/A Weeks of Treatment: 5 N/A N/A Wound Status: Open N/A N/A Wound Recurrence: No N/A N/A Measurements L x W x D (cm) 0.1x0.1x0.1 N/A N/A Area (cm) : 0.008 N/A N/A Volume (cm) : 0.001 N/A N/A % Reduction in Area: 100.00% N/A N/A % Reduction in Volume: 100.00% N/A N/A Classification: Full Thickness  Without Exposed N/A N/A Support Structures Exudate Amount: None Present N/A N/A Granulation Amount: None Present (0%) N/A N/A Necrotic Amount: None Present (0%) N/A N/A Exposed Structures: Fascia: No N/A N/A Fat Layer (Subcutaneous Tissue): No Tendon: No Muscle: No Joint: No Bone: No Treatment Notes Electronic Signature(s) Signed: 06/22/2021 1:37:32 PM By: [name] Entered By: [name] on 06/22/2021 13:14:57 Djordjevic, Arlyce Robbins ([id number]) -------------------------------------------------------------------------------- [place] Details Patient Name: Tonya Fail T. Date of Service: 06/22/2021 1:00 PM Medical Record Number: [id number] Patient Account Number: [id number] Date of Birth/Sex: 1934/08/23 (87 y.o. F) Treating RN: [name] Primary Care Jack Bolio: [name] Other Clinician: Referring Shaquella Stamant: [name] Treating Debie Ashline/Extender: [name] in Treatment: 5 Active Inactive Electronic Signature(s) Signed: 06/22/2021 1:37:32 PM By: [name] Entered By: [name] on 06/22/2021 13:14:46 Penado, Arlyce Robbins ([id number]) -------------------------------------------------------------------------------- Pain Assessment Details Patient Name: Tonya Fail T. Date of Service: 06/22/2021 1:00 PM Medical Record Number:  [id number] Patient Account Number: [id number] Date of Birth/Sex: [birth year] (87 y.o. F) Treating RN: [name] Primary Care Shadoe Bethel: [name] Other Clinician: Referring Azure Barrales: [name] Treating Amirra Herling/Extender: [name] in Treatment: 5 Active Problems Location of Pain Severity and Description of Pain Patient Has Paino No Site Locations Rate the pain. Current Pain Level: 0 Pain Management and Medication Current Pain Management: Electronic Signature(s) Signed: 06/22/2021 1:37:32 PM By: [name] Entered By: [name] on 06/22/2021 13:09:34 [place], Arlyce Robbins ([id number]) -------------------------------------------------------------------------------- Patient/Caregiver Education Details Patient Name: Tonya Fail T. Date of Service: 06/22/2021 1:00 PM Medical Record Number: [id number] Patient Account Number: [id number] Date of Birth/Gender: 09-01-1934 (87 y.o. F) Treating RN: [name] Primary Care Physician: [name] Other Clinician: Referring Physician: [name] Treating Physician/Extender: [name] in Treatment: 5 Education Assessment Education Provided To: Patient Education Topics Provided Basic Hygiene: Venous: Electronic Signature(s) Signed: 06/22/2021 1:37:32 PM By: [name] Entered By: [name] on 06/22/2021 13:25:49 Gaskill, Arlyce Robbins ([id number]) -------------------------------------------------------------------------------- Wound Assessment Details Patient Name: Tonya Fail T. Date of Service: 06/22/2021 1:00 PM Medical Record Number: [id number] Patient Account Number: [id number] Date of Birth/Sex: 10/23/1934 (87 y.o. F) Treating RN: [name] Primary Care Minetta Krisher: [name] Other Clinician: Referring [place] Gipe: [name] Treating Leahna Hewson/Extender: [name] in Treatment: 5 Wound Status Wound Number: 1 Primary Etiology: Trauma, Other Wound Location:  Left Lower Leg Wound Status: Healed - Epithelialized Wounding Event: Skin Tear/Laceration Comorbid History: Coronary Artery Disease, Neuropathy Date Acquired: 05/16/2021 Weeks Of Treatment: 5 Clustered Wound: No Photos Wound Measurements Length: (cm) 0 Width: (cm) 0 Depth: (cm) 0 Area: (cm) 0 Volume: (cm) 0 % Reduction in Area: 100% % Reduction in Volume: 100% Tunneling: No Undermining: No Wound Description Classification: Full Thickness Without Exposed Support Structure Exudate Amount: None Present s Foul Odor After Cleansing: No Slough/Fibrino No Wound Bed Granulation Amount: None Present (0%) Exposed Structure Necrotic Amount: None Present (0%) Fascia Exposed: No Fat Layer (Subcutaneous Tissue) Exposed: No Tendon Exposed: No Muscle Exposed: No Joint Exposed: No Bone Exposed: No Treatment Notes Wound #1 (Lower Leg) Wound Laterality: Left Cleanser Peri-Wound Care Topical Primary Dressing SHEVY, YANEY ([id number]) Secondary Dressing Secured With Compression Wrap Compression Stockings Add-Ons Electronic Signature(s) Signed: 06/22/2021 1:37:32 PM By: [name] Entered By: [name] on 06/22/2021 13:29:14 Russman, Arlyce Robbins ([id number]) -------------------------------------------------------------------------------- Vitals Details Patient Name: Tonya Fail T. Date of Service: 06/22/2021 1:00 PM Medical Record Number: [id number] Patient Account Number: [id number] Date of Birth/Sex: [birth year] (87 y.o. F) Treating RN: [name] Primary Care Tattianna Schnarr: [name] Other  Clinician: Referring Connelly [place]: [name] Treating Cailee Blanke/Extender: [name] in Treatment: 5 Vital Signs Time Taken: 13:05 Temperature (F): 97.8 Height (in): 65 Pulse (bpm): 106 Weight (lbs): 169 Respiratory Rate (breaths/min): 16 Body Mass Index (BMI): 28.1 Blood Pressure (mmHg): 177/75 Reference Range: 80 - 120 mg / dl Electronic  Signature(s) Signed: 06/22/2021 1:37:32 PM By: [name] Entered ByDonnamarie Poag on 06/22/2021 13:09:10

## 2021-06-22 NOTE — Progress Notes (Signed)
Tonya, Robbins ([id number]) Visit Report for 06/22/2021 Chief Complaint Document Details Patient Name: [name], [name] Date of Service: 06/22/2021 1:00 PM Medical Record Number: [id number] Patient Account Number: [id number] Date of Birth/Sex: [birth year] (87 y.o. F) Treating RN: [name] Primary Care Provider: [name] Other Clinician: Referring Provider: [name] Treating Provider/Extender: [name] in Treatment: 5 Information Obtained from: Patient Chief Complaint Left lower extremity and left elbow wounds following a fall Electronic Signature(s) Signed: 06/22/2021 1:44:17 PM By: Kalman Shan DO Entered By: Kalman Shan on 06/22/2021 13:39:55 Hirsch, [name] ([id number]) -------------------------------------------------------------------------------- HPI Details Patient Name: Tonya Fail T. Date of Service: 06/22/2021 1:00 PM Medical Record Number: [id number] Patient Account Number: [id number] Date of Birth/Sex: 01-Oct-1934 (87 y.o. F) Treating RN: [name] Primary Care Provider: [name] Other Clinician: Referring Provider: [name] Treating Provider/Extender: [name] in Treatment: 5 History of Present Illness HPI Description: Admission 05/18/2021 [name] is an 87 year old female with a past medical history of osteoporosis, temporal arteritis and hypertension that presents to the clinic for a left lower extremity wound and left elbow wound due to a fall. She states she went to the attic to retrieve an item and she fell and injured her left side. She visited dermatology for this issue and was referred to our clinic. She is currently been keeping the area covered. She currently denies signs of infection. She reports mild tenderness to the wound beds. 12/28; patient presents for follow-up. She has been using Hydrofera Blue to the wound beds. She has a scab to the left elbow wound. She has  no issues or complaints today. She currently denies signs of infection. 1/4; patient presents for follow-up. She has been using Hydrofera blue with dressing changes. She reports more pain to the wound bed in the past week. She denies purulent drainage. 1/11; patient presents for follow-up. She reports improvement in her wound healing. She is still taking the antibiotic prescribed at last clinic visit. She has been using Hydrofera Blue to the wound bed. She has no issues or complaints today. 1/18; patient presents for follow-up. She has been using Hydrofera Blue with dressing changes. She reports improvement in wound healing. 1/25; patient presents for follow-up. She has been using Hydrofera Blue with dressing changes. She reports improvement in wound healing. Electronic Signature(s) Signed: 06/22/2021 1:44:17 PM By: Kalman Shan DO Entered By: Kalman Shan on 06/22/2021 13:40:28 Fosberg, [name] ([id number]) -------------------------------------------------------------------------------- Physical Exam Details Patient Name: Tonya Fail T. Date of Service: 06/22/2021 1:00 PM Medical Record Number: [id number] Patient Account Number: [id number] Date of Birth/Sex: 17-Apr-1935 (87 y.o. F) Treating RN: [name] Primary Care Provider: [name] Other Clinician: Referring Provider: [name] Treating Provider/Extender: [name] in Treatment: 5 Constitutional . Cardiovascular . Psychiatric . Notes Left lower extremity: Epithelialization to the previous wound site. No signs of infection. Surrounding skin intact. Electronic Signature(s) Signed: 06/22/2021 1:44:17 PM By: Kalman Shan DO Entered By: Kalman Shan on 06/22/2021 13:40:55 Shutter, [name] ([id number]) -------------------------------------------------------------------------------- Physician Orders Details Patient Name: Tonya Fail T. Date of Service: 06/22/2021 1:00  PM Medical Record Number: [id number] Patient Account Number: [id number] Date of Birth/Sex: 09/09/1934 (87 y.o. F) Treating RN: [name] Primary Care Provider: [name] Other Clinician: Referring Provider: [name] Treating Provider/Extender: [name] in Treatment: 5 Verbal / Phone Orders: No Diagnosis Coding Discharge From [facility] o Discharge from [place] [facility] o Wear compression garments daily. Put garments on first thing when you wake  up and remove them before bed. - for leg swelling o Moisturize legs daily after removing compression garments. - Recommend good lotion or vaseline to protect newly healed skin Electronic Signature(s) Signed: 06/22/2021 1:44:17 PM By: Kalman Shan DO Previous Signature: 06/22/2021 1:37:32 PM Version By: [name] Entered By: Kalman Shan on 06/22/2021 13:43:21 Winship, [name] ([id number]) -------------------------------------------------------------------------------- Problem List Details Patient Name: Tonya Fail T. Date of Service: 06/22/2021 1:00 PM Medical Record Number: [id number] Patient Account Number: [id number] Date of Birth/Sex: August 28, 1934 (87 y.o. F) Treating RN: [name] Primary Care Provider: [name] Other Clinician: Referring Provider: [name] Treating Provider/Extender: [name] in Treatment: 5 Active Problems ICD-10 Encounter Code Description Active Date MDM Diagnosis [phone] Non-pressure chronic ulcer of other part of left lower leg with fat layer 05/18/2021 No Yes exposed S51.002A Unspecified open wound of left elbow, initial encounter 05/18/2021 No Yes L98.491 Non-pressure chronic ulcer of skin of other sites limited to breakdown of 05/18/2021 No Yes skin M31.6 Other giant cell arteritis 05/18/2021 No Yes M81.0 Age-related osteoporosis without current pathological fracture 05/18/2021 No Yes T79.8XXD Other early  complications of trauma, subsequent encounter 05/18/2021 No Yes L03.116 Cellulitis of left lower limb 06/01/2021 No Yes Inactive Problems Resolved Problems Electronic Signature(s) Signed: 06/22/2021 1:44:17 PM By: Kalman Shan DO Entered By: Kalman Shan on 06/22/2021 13:39:50 Minner, [name] ([id number]) -------------------------------------------------------------------------------- Progress Note Details Patient Name: Tonya Fail T. Date of Service: 06/22/2021 1:00 PM Medical Record Number: [id number] Patient Account Number: [id number] Date of Birth/Sex: February 18, 1935 (87 y.o. F) Treating RN: [name] Primary Care Provider: [name] Other Clinician: Referring Provider: [name] Treating Provider/Extender: [name] in Treatment: 5 Subjective Chief Complaint Information obtained from Patient Left lower extremity and left elbow wounds following a fall History of Present Illness (HPI) Admission 05/18/2021 Ms. Clydell Alberts is an 87 year old female with a past medical history of osteoporosis, temporal arteritis and hypertension that presents to the clinic for a left lower extremity wound and left elbow wound due to a fall. She states she went to the attic to retrieve an item and she fell and injured her left side. She visited dermatology for this issue and was referred to our clinic. She is currently been keeping the area covered. She currently denies signs of infection. She reports mild tenderness to the wound beds. 12/28; patient presents for follow-up. She has been using Hydrofera Blue to the wound beds. She has a scab to the left elbow wound. She has no issues or complaints today. She currently denies signs of infection. 1/4; patient presents for follow-up. She has been using Hydrofera blue with dressing changes. She reports more pain to the wound bed in the past week. She denies purulent drainage. 1/11; patient presents for follow-up. She  reports improvement in her wound healing. She is still taking the antibiotic prescribed at last clinic visit. She has been using Hydrofera Blue to the wound bed. She has no issues or complaints today. 1/18; patient presents for follow-up. She has been using Hydrofera Blue with dressing changes. She reports improvement in wound healing. 1/25; patient presents for follow-up. She has been using Hydrofera Blue with dressing changes. She reports improvement in wound healing. Objective Constitutional Vitals Time Taken: 1:05 PM, Height: 65 in, Weight: 169 lbs, BMI: 28.1, Temperature: 97.8 F, Pulse: 106 bpm, Respiratory Rate: 16 breaths/min, Blood Pressure: 177/75 mmHg. General Notes: Left lower extremity: Epithelialization to the previous wound site. No signs of infection. Surrounding skin intact. Integumentary (Hair, Skin) Wound #1  status is Healed - Epithelialized. Original cause of wound was Skin Tear/Laceration. The date acquired was: 05/16/2021. The wound has been in treatment 5 weeks. The wound is located on the Left Lower Leg. The wound measures 0cm length x 0cm width x 0cm depth; 0cm^2 area and 0cm^3 volume. There is no tunneling or undermining noted. There is a none present amount of drainage noted. There is no granulation within the wound bed. There is no necrotic tissue within the wound bed. Assessment Active Problems ICD-10 Non-pressure chronic ulcer of other part of left lower leg with fat layer exposed Unspecified open wound of left elbow, initial encounter Non-pressure chronic ulcer of skin of other sites limited to breakdown of skin Other giant cell arteritis Age-related osteoporosis without current pathological fracture Other early complications of trauma, subsequent encounter Zinger, Nisa T. ([id number]) Cellulitis of left lower limb Patient has done well with Hydrofera Blue. Her wound has healed. I recommended Vaseline daily to the previous wound site. Follow-up  as needed. Plan Discharge From [facility] Services: Discharge from [place] [facility] Complete Wear compression garments daily. Put garments on first thing when you wake up and remove them before bed. - for leg swelling Moisturize legs daily after removing compression garments. - Recommend good lotion or vaseline to protect newly healed skin 1. Discharge from wound center due to closed wound 2. Follow-up as needed Electronic Signature(s) Signed: 06/22/2021 1:44:17 PM By: Kalman Shan DO Entered By: Kalman Shan on 06/22/2021 13:41:59 Tinoco, [name] ([id number]) -------------------------------------------------------------------------------- SuperBill Details Patient Name: Tonya Fail T. Date of Service: 06/22/2021 Medical Record Number: [id number] Patient Account Number: [id number] Date of Birth/Sex: 03/13/1935 (87 y.o. F) Treating RN: [name] Primary Care Provider: [name] Other Clinician: Referring Provider: [name] Treating Provider/Extender: [name] in Treatment: 5 Diagnosis Coding ICD-10 Codes Code Description [phone] Non-pressure chronic ulcer of other part of left lower leg with fat layer exposed S51.002A Unspecified open wound of left elbow, initial encounter L98.491 Non-pressure chronic ulcer of skin of other sites limited to breakdown of skin M31.6 Other giant cell arteritis M81.0 Age-related osteoporosis without current pathological fracture T79.8XXD Other early complications of trauma, subsequent encounter L03.116 Cellulitis of left lower limb Facility Procedures CPT4 Code: [id number] Description: [phone] - WOUND CARE VISIT-LEV 2 EST PT Modifier: Quantity: 1 Physician Procedures CPT4 Code: 3536144 Description: 31540 - WC PHYS LEVEL 3 - EST PT Modifier: Quantity: 1 CPT4 Code: Description: ICD-10 Diagnosis Description L97.822 Non-pressure chronic ulcer of other part of left lower leg with fat lay T79.8XXD Other  early complications of trauma, subsequent encounter Modifier: er exposed Quantity: Electronic Signature(s) Signed: 06/22/2021 1:44:17 PM By: Kalman Shan DO Previous Signature: 06/22/2021 1:37:32 PM Version By: [name] Entered By: Kalman Shan on 06/22/2021 13:42:53

## 2021-06-23 ENCOUNTER — Telehealth: Payer: Self-pay | Admitting: Internal Medicine

## 2021-06-23 NOTE — Telephone Encounter (Signed)
Pt called and states that she get her injection every Tues. But no appt was set up for 2-31. Please set up appt and call pt at [phone]

## 2021-06-23 NOTE — Telephone Encounter (Signed)
Check out from 12/13 visit:  RTC weekly preferably around 10 am for labs and possible zarxio. See Dr. Rogue Bussing in 2 months....JEB  Next scheduled appt on 2/7.  Need appt scheduled for 1/31 for lab/possible Zarxio.

## 2021-06-28 ENCOUNTER — Inpatient Hospital Stay: Payer: Medicare Other

## 2021-06-28 ENCOUNTER — Other Ambulatory Visit: Payer: Self-pay

## 2021-06-28 DIAGNOSIS — D708 Other neutropenia: Secondary | ICD-10-CM

## 2021-06-28 DIAGNOSIS — D72819 Decreased white blood cell count, unspecified: Secondary | ICD-10-CM

## 2021-06-28 LAB — CBC WITH DIFFERENTIAL/PLATELET
Abs Immature Granulocytes: 0.16 10*3/uL — ABNORMAL HIGH (ref 0.00–0.07)
Basophils Absolute: 0 10*3/uL (ref 0.0–0.1)
Basophils Relative: 0 %
Eosinophils Absolute: 0 10*3/uL (ref 0.0–0.5)
Eosinophils Relative: 2 %
HCT: 47 % — ABNORMAL HIGH (ref 36.0–46.0)
Hemoglobin: 14.7 g/dL (ref 12.0–15.0)
Immature Granulocytes: 6 %
Lymphocytes Relative: 50 %
Lymphs Abs: 1.4 10*3/uL (ref 0.7–4.0)
MCH: 28.8 pg (ref 26.0–34.0)
MCHC: 31.3 g/dL (ref 30.0–36.0)
MCV: 92.2 fL (ref 80.0–100.0)
Monocytes Absolute: 0.2 10*3/uL (ref 0.1–1.0)
Monocytes Relative: 7 %
Neutro Abs: 1 10*3/uL — ABNORMAL LOW (ref 1.7–7.7)
Neutrophils Relative %: 35 %
Platelets: 204 10*3/uL (ref 150–400)
RBC: 5.1 MIL/uL (ref 3.87–5.11)
RDW: 15 % (ref 11.5–15.5)
Smear Review: NORMAL
WBC: 2.7 10*3/uL — ABNORMAL LOW (ref 4.0–10.5)
nRBC: 0 % (ref 0.0–0.2)

## 2021-06-28 MED ORDER — FILGRASTIM-SNDZ 480 MCG/0.8ML IJ SOSY
480.0000 ug | PREFILLED_SYRINGE | Freq: Once | INTRAMUSCULAR | Status: AC
  Administered 2021-06-28: 480 ug via SUBCUTANEOUS
  Filled 2021-06-28: qty 0.8

## 2021-06-29 ENCOUNTER — Encounter: Payer: Medicare Other | Admitting: Internal Medicine

## 2021-07-04 DIAGNOSIS — E7800 Pure hypercholesterolemia, unspecified: Secondary | ICD-10-CM | POA: Diagnosis not present

## 2021-07-05 ENCOUNTER — Inpatient Hospital Stay: Payer: Medicare Other | Attending: Internal Medicine

## 2021-07-05 ENCOUNTER — Inpatient Hospital Stay: Payer: Medicare Other

## 2021-07-05 ENCOUNTER — Inpatient Hospital Stay (HOSPITAL_BASED_OUTPATIENT_CLINIC_OR_DEPARTMENT_OTHER): Payer: Medicare Other | Admitting: Internal Medicine

## 2021-07-05 ENCOUNTER — Other Ambulatory Visit: Payer: Self-pay

## 2021-07-05 ENCOUNTER — Encounter: Payer: Self-pay | Admitting: Internal Medicine

## 2021-07-05 DIAGNOSIS — D708 Other neutropenia: Secondary | ICD-10-CM | POA: Insufficient documentation

## 2021-07-05 DIAGNOSIS — R030 Elevated blood-pressure reading, without diagnosis of hypertension: Secondary | ICD-10-CM | POA: Insufficient documentation

## 2021-07-05 DIAGNOSIS — Z87442 Personal history of urinary calculi: Secondary | ICD-10-CM | POA: Diagnosis not present

## 2021-07-05 DIAGNOSIS — D702 Other drug-induced agranulocytosis: Secondary | ICD-10-CM

## 2021-07-05 DIAGNOSIS — Z811 Family history of alcohol abuse and dependence: Secondary | ICD-10-CM | POA: Diagnosis not present

## 2021-07-05 DIAGNOSIS — Z87891 Personal history of nicotine dependence: Secondary | ICD-10-CM | POA: Insufficient documentation

## 2021-07-05 DIAGNOSIS — M316 Other giant cell arteritis: Secondary | ICD-10-CM | POA: Insufficient documentation

## 2021-07-05 DIAGNOSIS — D72819 Decreased white blood cell count, unspecified: Secondary | ICD-10-CM

## 2021-07-05 DIAGNOSIS — Z800 Family history of malignant neoplasm of digestive organs: Secondary | ICD-10-CM | POA: Diagnosis not present

## 2021-07-05 DIAGNOSIS — Z808 Family history of malignant neoplasm of other organs or systems: Secondary | ICD-10-CM | POA: Insufficient documentation

## 2021-07-05 DIAGNOSIS — Z8719 Personal history of other diseases of the digestive system: Secondary | ICD-10-CM | POA: Insufficient documentation

## 2021-07-05 DIAGNOSIS — Z79899 Other long term (current) drug therapy: Secondary | ICD-10-CM | POA: Insufficient documentation

## 2021-07-05 DIAGNOSIS — Z86010 Personal history of colonic polyps: Secondary | ICD-10-CM | POA: Diagnosis not present

## 2021-07-05 DIAGNOSIS — Z8616 Personal history of COVID-19: Secondary | ICD-10-CM | POA: Insufficient documentation

## 2021-07-05 DIAGNOSIS — R200 Anesthesia of skin: Secondary | ICD-10-CM | POA: Diagnosis not present

## 2021-07-05 LAB — CBC WITH DIFFERENTIAL/PLATELET
Abs Immature Granulocytes: 0.04 10*3/uL (ref 0.00–0.07)
Basophils Absolute: 0 10*3/uL (ref 0.0–0.1)
Basophils Relative: 0 %
Eosinophils Absolute: 0 10*3/uL (ref 0.0–0.5)
Eosinophils Relative: 1 %
HCT: 46.1 % — ABNORMAL HIGH (ref 36.0–46.0)
Hemoglobin: 14.5 g/dL (ref 12.0–15.0)
Immature Granulocytes: 2 %
Lymphocytes Relative: 45 %
Lymphs Abs: 1.1 10*3/uL (ref 0.7–4.0)
MCH: 28.9 pg (ref 26.0–34.0)
MCHC: 31.5 g/dL (ref 30.0–36.0)
MCV: 91.8 fL (ref 80.0–100.0)
Monocytes Absolute: 0.3 10*3/uL (ref 0.1–1.0)
Monocytes Relative: 12 %
Neutro Abs: 1 10*3/uL — ABNORMAL LOW (ref 1.7–7.7)
Neutrophils Relative %: 40 %
Platelets: 186 10*3/uL (ref 150–400)
RBC: 5.02 MIL/uL (ref 3.87–5.11)
RDW: 15.1 % (ref 11.5–15.5)
Smear Review: ADEQUATE
WBC Morphology: REACTIVE
WBC: 2.5 10*3/uL — ABNORMAL LOW (ref 4.0–10.5)
nRBC: 0 % (ref 0.0–0.2)

## 2021-07-05 MED ORDER — FILGRASTIM-SNDZ 480 MCG/0.8ML IJ SOSY
480.0000 ug | PREFILLED_SYRINGE | Freq: Once | INTRAMUSCULAR | Status: AC
  Administered 2021-07-05: 480 ug via SUBCUTANEOUS
  Filled 2021-07-05: qty 0.8

## 2021-07-05 NOTE — Assessment & Plan Note (Addendum)
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  Patient clinically asymptomatic.  Continue cbc/zarxioweekly/pt pref.STABLE; continue prednisone 5 mg/day [re- fill]; continue at current dose.   # Right LE infection s/p excision re: Skin cancer [Dr.Dasher]- STABLE.   # Elevated BP [at home ~130]- STABLE.   # Hx of temporal arteritis->20 years on low dose prednisone-STABLE.   # Easy burising Bil LE/Upper extremity: sec to prednisone/senile purpura. STABLE.   # Left foot numbness- intermittent- defer to PCP.   # DISPOSITION: # proceed with ZARXIO today # weekly cbc/zaxio x 12 # follow up with MD in 12  Weeks; - cbc/bmp/ b12 levels-zarxio-Dr.B

## 2021-07-05 NOTE — Progress Notes (Signed)
[place] NOTE  Patient Care Team: Baxter Hire, MD as PCP - General (Internal Medicine) Emmaline Kluver., MD (Rheumatology) Dasher, Rayvon Char, MD (Dermatology) Cammie Sickle, MD as Consulting Physician (Hematology and Oncology) Carloyn Manner, MD as Consulting Physician (Otolaryngology)  CHIEF COMPLAINTS/PURPOSE OF CONSULTATION: Autoimmune neutropenia  #April 2019- Autoimmune neutropenia-weekly Granix/CBC [Dr.Crocoran]; BMBx- no malignancy [Bone marrow aspirate and biopsy on 09/04/2017 revealed a hypercellular marrow for age with trilineage hematopoiesis.  There was abundant mature neutrophils.  Significant dyspoiesis or increased blasts was not identified.  Flow cytometry revealed a predominance of T lymphocytes (16% of all cells) with no abnormal phenotype.  There was a minor B-cell population (13% of lymphocytes) with slight kappa light chain excess.  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    HISTORY OF PRESENTING ILLNESS:  [name] 87 y.o.  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  Patient continues to complain of easy bruising.  Otherwise denies any hospitalizations.  Denies any need for antibiotics.  Complains of intermittent left foot numbness especially at nighttime.  Review of Systems  Constitutional:  Negative for chills, diaphoresis, fever, malaise/fatigue and weight loss.  HENT:  Negative for nosebleeds and sore throat.   Eyes:  Negative for double vision.  Respiratory:  Negative  for cough, hemoptysis, sputum production, shortness of breath and wheezing.   Cardiovascular:  Negative for chest pain, palpitations and orthopnea.  Gastrointestinal:  Negative for blood in stool, constipation, diarrhea, heartburn, melena, nausea and vomiting.  Genitourinary:  Negative for dysuria, frequency and urgency.  Musculoskeletal:  Negative for back pain and joint pain.  Skin:  Negative for itching.  Neurological:  Positive for tingling. Negative for dizziness, focal weakness, weakness and headaches.  Endo/Heme/Allergies:  Bruises/bleeds easily.  Psychiatric/Behavioral:  Negative for depression. The patient is nervous/anxious. The patient does not have insomnia.     MEDICAL HISTORY:  Past Medical History:  Diagnosis Date   Breast cancer ([place]) 1985   bilateral mastectomies   Depression    1 time specific situation that she did not know how to deal wiht   Diverticulosis    Hiatal hernia    History of Bell's palsy    History of colon polyps    History of COVID-19    History of nephrolithiasis    History of pancreatitis    Hyperlipidemia    IBS (irritable bowel syndrome)    Osteoporosis    osteopenia in neck   Skin cancer    Temporal arteritis ([place])     SURGICAL HISTORY: Past Surgical History:  Procedure Laterality Date   AUGMENTATION MAMMAPLASTY Bilateral 2000   COLON RESECTION SIGMOID N/A 10/13/2017   Procedure: COLON RESECTION SIGMOID;  Surgeon: Herbert Pun, MD;  Location: [facility] ORS;  Service: General;  Laterality: N/A;   COLOSTOMY N/A 10/13/2017   Procedure: COLOSTOMY;  Surgeon: Herbert Pun, MD;  Location: [facility] ORS;  Service: General;  Laterality: N/A;   LAPAROSCOPIC CHOLECYSTECTOMY  2009   MASTECTOMY  1983   bilateral    SOCIAL HISTORY: Social History   Socioeconomic History   Marital status:  Widowed    Spouse name: Not on file   Number of children: 2   Years of education: college   Highest education level: Not on file  Occupational  History   Occupation: Engineer, production   Occupation: Retired  Tobacco Use   Smoking status: Former    Packs/day: 0.25    Years: 30.00    Pack years: 7.50    Types: Cigarettes    Quit date: 05/17/2005    Years since quitting: 16.1   Smokeless tobacco: Never   Tobacco comments:    quit 11 years ago  Vaping Use   Vaping Use: Never used  Substance and Sexual Activity   Alcohol use: No    Alcohol/week: 0.0 standard drinks   Drug use: No   Sexual activity: Not Currently  Other Topics Concern   Not on file  Social History Narrative   Patient lives at home alone.    Patient is retired.    Patient has some college.    Patient has 2 children.   Social Determinants of Health   Financial Resource Strain: Not on file  Food Insecurity: Not on file  Transportation Needs: Not on file  Physical Activity: Not on file  Stress: Not on file  Social Connections: Not on file  Intimate Partner Violence: Not on file    FAMILY HISTORY: Family History  Problem Relation Age of Onset   Colon cancer Mother 75   Scleroderma Father 65   Alcoholism Brother    Stomach cancer Neg Hx     ALLERGIES:  is allergic to morphine, benzalkonium chloride, escitalopram, hydralazine, neomycin-bacitracin zn-polymyx, and bacitracin-neomycin-polymyxin.  MEDICATIONS:  Current Outpatient Medications  Medication Sig Dispense Refill   alendronate (FOSAMAX) 70 MG tablet Take 70 mg by mouth once a week.     ALPRAZolam (XANAX) 0.25 MG tablet Take 0.25 mg by mouth 2 (two) times daily as needed.      Ascorbic Acid (VITAMIN C) 1000 MG tablet Take 1,000 mg by mouth daily.     aspirin EC 81 MG tablet Take 81 mg daily by mouth.     b complex vitamins capsule Take 1 capsule by mouth daily.     Biotin 1000 MCG tablet Take 1,000 mcg by mouth daily.      Cholecalciferol (VITAMIN D3) 2000 units capsule Take 2,000 Units by mouth daily.     Cyanocobalamin (B-12 PO) Take 2,500 mcg by mouth daily.     filgrastim-sndz (ZARXIO) 480  MCG/0.8ML SOSY injection Inject 0.8 mLs (480 mcg total) into the skin once a week. Administer injection subcutaneously 3.2 mL 6   predniSONE (DELTASONE) 5 MG tablet Take 5 mg by mouth daily.     SSD 1 % cream Apply topically.     traMADol (ULTRAM) 50 MG tablet Take 50 mg by mouth daily.     ZINC OXIDE PO Take 1 tablet by mouth daily.     amLODipine (NORVASC) 5 MG tablet Take 5 mg by mouth daily. Pt states that she she increased the tablet to 5 mg daily. She was taking 2.5 mg. She stated that she thought the half tablet was not working efficiently. I asked the patient to inform her pcp that her dosage was increased.     No current facility-administered medications for this visit.    PHYSICAL EXAMINATION: ECOG PERFORMANCE STATUS: 0 - Asymptomatic  Vitals:   07/05/21 1115  BP: (!) 162/60  Pulse: 76  Temp: (!) 97.5 F (36.4 C)  SpO2: 100%  Filed Weights   07/05/21 1115  Weight: 168 lb 9.6 oz (76.5 kg)    Physical Exam Constitutional:      Comments: Alone.  HENT:     Head: Normocephalic and atraumatic.     Mouth/Throat:     Pharynx: No oropharyngeal exudate.  Eyes:     Pupils: Pupils are equal, round, and reactive to light.  Cardiovascular:     Rate and Rhythm: Normal rate and regular rhythm.  Pulmonary:     Effort: No respiratory distress.     Breath sounds: No wheezing.  Abdominal:     General: Bowel sounds are normal. There is no distension.     Palpations: Abdomen is soft. There is no mass.     Tenderness: There is no abdominal tenderness. There is no guarding or rebound.     Comments: Colostomy.  Parastomal hernia.  Musculoskeletal:        General: No tenderness. Normal range of motion.     Cervical back: Normal range of motion and neck supple.  Skin:    General: Skin is warm.     Comments: Hyperpigmented rash noted bilateral shins.  Right arm bruising/thin skin.  Neurological:     Mental Status: She is alert and oriented to person, place, and time.   Psychiatric:        Mood and Affect: Affect normal.     Comments: Anxious.     LABORATORY DATA:  I have reviewed the data as listed Lab Results  Component Value Date   WBC 2.5 (L) 07/05/2021   HGB 14.5 07/05/2021   HCT 46.1 (H) 07/05/2021   MCV 91.8 07/05/2021   PLT 186 07/05/2021   Recent Labs    03/01/21 1105 03/15/21 0908 05/10/21 0925  NA 139 138 138  K 4.3 4.3 3.6  CL 101 100 99  CO2 28 29 29   GLUCOSE 106* 101* 94  BUN 22 20 15   CREATININE 1.04* 1.06* 0.99  CALCIUM 9.2 8.9 9.1  GFRNONAA 53* 52* 56*    RADIOGRAPHIC STUDIES: I have personally reviewed the radiological images as listed and agreed with the findings in the report. No results found.   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  Patient clinically asymptomatic.  Continue cbc/zarxioweekly/pt pref.STABLE; continue prednisone 5 mg/day [re- fill]; continue at current dose.   # Right LE infection s/p excision re: Skin cancer [Dr.Dasher]- STABLE.   # Elevated BP [at home ~130]- STABLE.   # Hx of temporal arteritis->20 years on low dose prednisone-STABLE.   # Easy burising Bil LE/Upper extremity: sec to prednisone/senile purpura. STABLE.   # Left foot numbness- intermittent- defer to PCP.   # DISPOSITION: # proceed with ZARXIO today # weekly cbc/zaxio x 12 # follow up with MD in 12  Weeks; - cbc/bmp/ b12 levels-zarxio-Dr.B  All questions were answered. The patient knows to call the clinic with any problems, questions or concerns.    Cammie Sickle, MD 07/05/2021 1:24 PM

## 2021-07-09 ENCOUNTER — Other Ambulatory Visit (HOSPITAL_COMMUNITY): Payer: Self-pay

## 2021-07-11 DIAGNOSIS — E7800 Pure hypercholesterolemia, unspecified: Secondary | ICD-10-CM | POA: Diagnosis not present

## 2021-07-11 DIAGNOSIS — M810 Age-related osteoporosis without current pathological fracture: Secondary | ICD-10-CM | POA: Diagnosis not present

## 2021-07-11 DIAGNOSIS — D704 Cyclic neutropenia: Secondary | ICD-10-CM | POA: Diagnosis not present

## 2021-07-11 DIAGNOSIS — I70203 Unspecified atherosclerosis of native arteries of extremities, bilateral legs: Secondary | ICD-10-CM | POA: Diagnosis not present

## 2021-07-12 ENCOUNTER — Other Ambulatory Visit: Payer: Self-pay

## 2021-07-12 ENCOUNTER — Inpatient Hospital Stay: Payer: Medicare Other

## 2021-07-12 DIAGNOSIS — D708 Other neutropenia: Secondary | ICD-10-CM | POA: Diagnosis not present

## 2021-07-12 LAB — CBC WITH DIFFERENTIAL/PLATELET
Abs Immature Granulocytes: 0.28 10*3/uL — ABNORMAL HIGH (ref 0.00–0.07)
Basophils Absolute: 0 10*3/uL (ref 0.0–0.1)
Basophils Relative: 0 %
Eosinophils Absolute: 0 10*3/uL (ref 0.0–0.5)
Eosinophils Relative: 1 %
HCT: 47.6 % — ABNORMAL HIGH (ref 36.0–46.0)
Hemoglobin: 15.1 g/dL — ABNORMAL HIGH (ref 12.0–15.0)
Immature Granulocytes: 10 %
Lymphocytes Relative: 26 %
Lymphs Abs: 0.8 10*3/uL (ref 0.7–4.0)
MCH: 29.2 pg (ref 26.0–34.0)
MCHC: 31.7 g/dL (ref 30.0–36.0)
MCV: 91.9 fL (ref 80.0–100.0)
Monocytes Absolute: 0.3 10*3/uL (ref 0.1–1.0)
Monocytes Relative: 8 %
Neutro Abs: 1.6 10*3/uL — ABNORMAL LOW (ref 1.7–7.7)
Neutrophils Relative %: 55 %
Platelets: 222 10*3/uL (ref 150–400)
RBC: 5.18 MIL/uL — ABNORMAL HIGH (ref 3.87–5.11)
RDW: 15.2 % (ref 11.5–15.5)
Smear Review: NORMAL
WBC: 3 10*3/uL — ABNORMAL LOW (ref 4.0–10.5)
nRBC: 0 % (ref 0.0–0.2)

## 2021-07-14 DIAGNOSIS — R309 Painful micturition, unspecified: Secondary | ICD-10-CM | POA: Diagnosis not present

## 2021-07-19 ENCOUNTER — Inpatient Hospital Stay: Payer: Medicare Other

## 2021-07-19 ENCOUNTER — Other Ambulatory Visit: Payer: Self-pay

## 2021-07-19 DIAGNOSIS — D708 Other neutropenia: Secondary | ICD-10-CM | POA: Diagnosis not present

## 2021-07-19 LAB — CBC WITH DIFFERENTIAL/PLATELET
Abs Immature Granulocytes: 0.03 10*3/uL (ref 0.00–0.07)
Basophils Absolute: 0 10*3/uL (ref 0.0–0.1)
Basophils Relative: 1 %
Eosinophils Absolute: 0 10*3/uL (ref 0.0–0.5)
Eosinophils Relative: 0 %
HCT: 46.5 % — ABNORMAL HIGH (ref 36.0–46.0)
Hemoglobin: 14.7 g/dL (ref 12.0–15.0)
Immature Granulocytes: 1 %
Lymphocytes Relative: 24 %
Lymphs Abs: 0.8 10*3/uL (ref 0.7–4.0)
MCH: 29.1 pg (ref 26.0–34.0)
MCHC: 31.6 g/dL (ref 30.0–36.0)
MCV: 91.9 fL (ref 80.0–100.0)
Monocytes Absolute: 0.3 10*3/uL (ref 0.1–1.0)
Monocytes Relative: 9 %
Neutro Abs: 2.1 10*3/uL (ref 1.7–7.7)
Neutrophils Relative %: 65 %
Platelets: 260 10*3/uL (ref 150–400)
RBC: 5.06 MIL/uL (ref 3.87–5.11)
RDW: 15.5 % (ref 11.5–15.5)
WBC: 3.2 10*3/uL — ABNORMAL LOW (ref 4.0–10.5)
nRBC: 0 % (ref 0.0–0.2)

## 2021-07-21 DIAGNOSIS — D2272 Melanocytic nevi of left lower limb, including hip: Secondary | ICD-10-CM | POA: Diagnosis not present

## 2021-07-21 DIAGNOSIS — D225 Melanocytic nevi of trunk: Secondary | ICD-10-CM | POA: Diagnosis not present

## 2021-07-21 DIAGNOSIS — Z85828 Personal history of other malignant neoplasm of skin: Secondary | ICD-10-CM | POA: Diagnosis not present

## 2021-07-21 DIAGNOSIS — D2262 Melanocytic nevi of left upper limb, including shoulder: Secondary | ICD-10-CM | POA: Diagnosis not present

## 2021-07-26 ENCOUNTER — Other Ambulatory Visit: Payer: Self-pay

## 2021-07-26 ENCOUNTER — Inpatient Hospital Stay: Payer: Medicare Other

## 2021-07-26 DIAGNOSIS — D708 Other neutropenia: Secondary | ICD-10-CM

## 2021-07-26 LAB — CBC WITH DIFFERENTIAL/PLATELET
Abs Immature Granulocytes: 0.11 10*3/uL — ABNORMAL HIGH (ref 0.00–0.07)
Basophils Absolute: 0 10*3/uL (ref 0.0–0.1)
Basophils Relative: 0 %
Eosinophils Absolute: 0 10*3/uL (ref 0.0–0.5)
Eosinophils Relative: 1 %
HCT: 46.3 % — ABNORMAL HIGH (ref 36.0–46.0)
Hemoglobin: 14.4 g/dL (ref 12.0–15.0)
Immature Granulocytes: 3 %
Lymphocytes Relative: 24 %
Lymphs Abs: 0.9 10*3/uL (ref 0.7–4.0)
MCH: 28.8 pg (ref 26.0–34.0)
MCHC: 31.1 g/dL (ref 30.0–36.0)
MCV: 92.6 fL (ref 80.0–100.0)
Monocytes Absolute: 0.3 10*3/uL (ref 0.1–1.0)
Monocytes Relative: 9 %
Neutro Abs: 2.4 10*3/uL (ref 1.7–7.7)
Neutrophils Relative %: 63 %
Platelets: 233 10*3/uL (ref 150–400)
RBC: 5 MIL/uL (ref 3.87–5.11)
RDW: 15.2 % (ref 11.5–15.5)
WBC: 3.7 10*3/uL — ABNORMAL LOW (ref 4.0–10.5)
nRBC: 0 % (ref 0.0–0.2)

## 2021-07-28 DIAGNOSIS — J40 Bronchitis, not specified as acute or chronic: Secondary | ICD-10-CM | POA: Diagnosis not present

## 2021-08-02 ENCOUNTER — Inpatient Hospital Stay: Payer: Medicare Other

## 2021-08-02 ENCOUNTER — Other Ambulatory Visit: Payer: Self-pay

## 2021-08-02 ENCOUNTER — Inpatient Hospital Stay: Payer: Medicare Other | Attending: Internal Medicine

## 2021-08-02 DIAGNOSIS — Z79899 Other long term (current) drug therapy: Secondary | ICD-10-CM | POA: Insufficient documentation

## 2021-08-02 DIAGNOSIS — D708 Other neutropenia: Secondary | ICD-10-CM | POA: Insufficient documentation

## 2021-08-02 LAB — CBC WITH DIFFERENTIAL/PLATELET
Abs Immature Granulocytes: 0.03 10*3/uL (ref 0.00–0.07)
Basophils Absolute: 0 10*3/uL (ref 0.0–0.1)
Basophils Relative: 0 %
Eosinophils Absolute: 0 10*3/uL (ref 0.0–0.5)
Eosinophils Relative: 1 %
HCT: 46.1 % — ABNORMAL HIGH (ref 36.0–46.0)
Hemoglobin: 14.5 g/dL (ref 12.0–15.0)
Immature Granulocytes: 1 %
Lymphocytes Relative: 30 %
Lymphs Abs: 0.9 10*3/uL (ref 0.7–4.0)
MCH: 28.7 pg (ref 26.0–34.0)
MCHC: 31.5 g/dL (ref 30.0–36.0)
MCV: 91.3 fL (ref 80.0–100.0)
Monocytes Absolute: 0.2 10*3/uL (ref 0.1–1.0)
Monocytes Relative: 7 %
Neutro Abs: 1.8 10*3/uL (ref 1.7–7.7)
Neutrophils Relative %: 61 %
Platelets: 226 10*3/uL (ref 150–400)
RBC: 5.05 MIL/uL (ref 3.87–5.11)
RDW: 15.3 % (ref 11.5–15.5)
WBC: 3 10*3/uL — ABNORMAL LOW (ref 4.0–10.5)
nRBC: 0 % (ref 0.0–0.2)

## 2021-08-08 DIAGNOSIS — I739 Peripheral vascular disease, unspecified: Secondary | ICD-10-CM | POA: Diagnosis not present

## 2021-08-09 ENCOUNTER — Inpatient Hospital Stay: Payer: Medicare Other

## 2021-08-09 ENCOUNTER — Other Ambulatory Visit: Payer: Self-pay

## 2021-08-09 ENCOUNTER — Other Ambulatory Visit: Payer: Self-pay | Admitting: *Deleted

## 2021-08-09 DIAGNOSIS — D708 Other neutropenia: Secondary | ICD-10-CM | POA: Diagnosis not present

## 2021-08-09 DIAGNOSIS — D702 Other drug-induced agranulocytosis: Secondary | ICD-10-CM

## 2021-08-09 DIAGNOSIS — D72819 Decreased white blood cell count, unspecified: Secondary | ICD-10-CM

## 2021-08-09 DIAGNOSIS — Z79899 Other long term (current) drug therapy: Secondary | ICD-10-CM | POA: Diagnosis not present

## 2021-08-09 LAB — CBC WITH DIFFERENTIAL/PLATELET
Abs Immature Granulocytes: 0.02 10*3/uL (ref 0.00–0.07)
Basophils Absolute: 0 10*3/uL (ref 0.0–0.1)
Basophils Relative: 0 %
Eosinophils Absolute: 0 10*3/uL (ref 0.0–0.5)
Eosinophils Relative: 1 %
HCT: 45.1 % (ref 36.0–46.0)
Hemoglobin: 14 g/dL (ref 12.0–15.0)
Immature Granulocytes: 1 %
Lymphocytes Relative: 39 %
Lymphs Abs: 0.9 10*3/uL (ref 0.7–4.0)
MCH: 29 pg (ref 26.0–34.0)
MCHC: 31 g/dL (ref 30.0–36.0)
MCV: 93.6 fL (ref 80.0–100.0)
Monocytes Absolute: 0.2 10*3/uL (ref 0.1–1.0)
Monocytes Relative: 7 %
Neutro Abs: 1.2 10*3/uL — ABNORMAL LOW (ref 1.7–7.7)
Neutrophils Relative %: 52 %
Platelets: 141 10*3/uL — ABNORMAL LOW (ref 150–400)
RBC: 4.82 MIL/uL (ref 3.87–5.11)
RDW: 15.2 % (ref 11.5–15.5)
WBC: 2.3 10*3/uL — ABNORMAL LOW (ref 4.0–10.5)
nRBC: 0 % (ref 0.0–0.2)

## 2021-08-09 MED ORDER — FILGRASTIM-SNDZ 480 MCG/0.8ML IJ SOSY
480.0000 ug | PREFILLED_SYRINGE | Freq: Once | INTRAMUSCULAR | Status: AC
  Administered 2021-08-09: 480 ug via SUBCUTANEOUS
  Filled 2021-08-09: qty 0.8

## 2021-08-16 ENCOUNTER — Other Ambulatory Visit: Payer: Self-pay

## 2021-08-16 ENCOUNTER — Inpatient Hospital Stay: Payer: Medicare Other

## 2021-08-16 DIAGNOSIS — D708 Other neutropenia: Secondary | ICD-10-CM | POA: Diagnosis not present

## 2021-08-16 DIAGNOSIS — D72819 Decreased white blood cell count, unspecified: Secondary | ICD-10-CM

## 2021-08-16 DIAGNOSIS — D702 Other drug-induced agranulocytosis: Secondary | ICD-10-CM

## 2021-08-16 LAB — CBC WITH DIFFERENTIAL/PLATELET
Abs Immature Granulocytes: 0.04 10*3/uL (ref 0.00–0.07)
Basophils Absolute: 0 10*3/uL (ref 0.0–0.1)
Basophils Relative: 1 %
Eosinophils Absolute: 0.1 10*3/uL (ref 0.0–0.5)
Eosinophils Relative: 2 %
HCT: 45.7 % (ref 36.0–46.0)
Hemoglobin: 14.4 g/dL (ref 12.0–15.0)
Immature Granulocytes: 2 %
Lymphocytes Relative: 52 %
Lymphs Abs: 1.4 10*3/uL (ref 0.7–4.0)
MCH: 28.9 pg (ref 26.0–34.0)
MCHC: 31.5 g/dL (ref 30.0–36.0)
MCV: 91.6 fL (ref 80.0–100.0)
Monocytes Absolute: 0.2 10*3/uL (ref 0.1–1.0)
Monocytes Relative: 9 %
Neutro Abs: 0.9 10*3/uL — ABNORMAL LOW (ref 1.7–7.7)
Neutrophils Relative %: 34 %
Platelets: 201 10*3/uL (ref 150–400)
RBC: 4.99 MIL/uL (ref 3.87–5.11)
RDW: 15.9 % — ABNORMAL HIGH (ref 11.5–15.5)
Smear Review: NORMAL
WBC: 2.7 10*3/uL — ABNORMAL LOW (ref 4.0–10.5)
nRBC: 0 % (ref 0.0–0.2)

## 2021-08-16 MED ORDER — FILGRASTIM-SNDZ 480 MCG/0.8ML IJ SOSY
480.0000 ug | PREFILLED_SYRINGE | Freq: Once | INTRAMUSCULAR | Status: AC
  Administered 2021-08-16: 480 ug via SUBCUTANEOUS
  Filled 2021-08-16: qty 0.8

## 2021-08-23 ENCOUNTER — Inpatient Hospital Stay: Payer: Medicare Other

## 2021-08-23 ENCOUNTER — Other Ambulatory Visit: Payer: Self-pay

## 2021-08-23 DIAGNOSIS — D708 Other neutropenia: Secondary | ICD-10-CM

## 2021-08-23 DIAGNOSIS — D702 Other drug-induced agranulocytosis: Secondary | ICD-10-CM

## 2021-08-23 DIAGNOSIS — D72819 Decreased white blood cell count, unspecified: Secondary | ICD-10-CM

## 2021-08-23 LAB — CBC WITH DIFFERENTIAL/PLATELET
Abs Immature Granulocytes: 0.15 10*3/uL — ABNORMAL HIGH (ref 0.00–0.07)
Basophils Absolute: 0 10*3/uL (ref 0.0–0.1)
Basophils Relative: 1 %
Eosinophils Absolute: 0 10*3/uL (ref 0.0–0.5)
Eosinophils Relative: 1 %
HCT: 44.3 % (ref 36.0–46.0)
Hemoglobin: 14 g/dL (ref 12.0–15.0)
Immature Granulocytes: 7 %
Lymphocytes Relative: 34 %
Lymphs Abs: 0.8 10*3/uL (ref 0.7–4.0)
MCH: 29.2 pg (ref 26.0–34.0)
MCHC: 31.6 g/dL (ref 30.0–36.0)
MCV: 92.5 fL (ref 80.0–100.0)
Monocytes Absolute: 0.2 10*3/uL (ref 0.1–1.0)
Monocytes Relative: 11 %
Neutro Abs: 1 10*3/uL — ABNORMAL LOW (ref 1.7–7.7)
Neutrophils Relative %: 46 %
Platelets: 188 10*3/uL (ref 150–400)
RBC: 4.79 MIL/uL (ref 3.87–5.11)
RDW: 15.8 % — ABNORMAL HIGH (ref 11.5–15.5)
Smear Review: NORMAL
WBC: 2.2 10*3/uL — ABNORMAL LOW (ref 4.0–10.5)
nRBC: 0 % (ref 0.0–0.2)

## 2021-08-23 MED ORDER — FILGRASTIM-SNDZ 480 MCG/0.8ML IJ SOSY
480.0000 ug | PREFILLED_SYRINGE | Freq: Once | INTRAMUSCULAR | Status: AC
  Administered 2021-08-23: 480 ug via SUBCUTANEOUS
  Filled 2021-08-23: qty 0.8

## 2021-08-29 DIAGNOSIS — S8012XA Contusion of left lower leg, initial encounter: Secondary | ICD-10-CM | POA: Diagnosis not present

## 2021-08-29 DIAGNOSIS — S40022A Contusion of left upper arm, initial encounter: Secondary | ICD-10-CM | POA: Diagnosis not present

## 2021-08-29 DIAGNOSIS — W010XXA Fall on same level from slipping, tripping and stumbling without subsequent striking against object, initial encounter: Secondary | ICD-10-CM | POA: Diagnosis not present

## 2021-08-29 DIAGNOSIS — M25562 Pain in left knee: Secondary | ICD-10-CM | POA: Diagnosis not present

## 2021-08-30 ENCOUNTER — Inpatient Hospital Stay: Payer: Medicare Other

## 2021-08-30 ENCOUNTER — Inpatient Hospital Stay: Payer: Medicare Other | Attending: Internal Medicine

## 2021-08-30 DIAGNOSIS — D708 Other neutropenia: Secondary | ICD-10-CM | POA: Diagnosis present

## 2021-08-30 DIAGNOSIS — D702 Other drug-induced agranulocytosis: Secondary | ICD-10-CM

## 2021-08-30 LAB — CBC WITH DIFFERENTIAL/PLATELET
Abs Immature Granulocytes: 0 10*3/uL (ref 0.00–0.07)
Basophils Absolute: 0 10*3/uL (ref 0.0–0.1)
Basophils Relative: 1 %
Eosinophils Absolute: 0 10*3/uL (ref 0.0–0.5)
Eosinophils Relative: 0 %
HCT: 40.2 % (ref 36.0–46.0)
Hemoglobin: 12.6 g/dL (ref 12.0–15.0)
Immature Granulocytes: 0 %
Lymphocytes Relative: 31 %
Lymphs Abs: 0.8 10*3/uL (ref 0.7–4.0)
MCH: 29.5 pg (ref 26.0–34.0)
MCHC: 31.3 g/dL (ref 30.0–36.0)
MCV: 94.1 fL (ref 80.0–100.0)
Monocytes Absolute: 0.3 10*3/uL (ref 0.1–1.0)
Monocytes Relative: 9 %
Neutro Abs: 1.6 10*3/uL — ABNORMAL LOW (ref 1.7–7.7)
Neutrophils Relative %: 59 %
Platelets: 222 10*3/uL (ref 150–400)
RBC: 4.27 MIL/uL (ref 3.87–5.11)
RDW: 16 % — ABNORMAL HIGH (ref 11.5–15.5)
Smear Review: NORMAL
WBC: 2.7 10*3/uL — ABNORMAL LOW (ref 4.0–10.5)
nRBC: 0 % (ref 0.0–0.2)

## 2021-08-31 ENCOUNTER — Telehealth (INDEPENDENT_AMBULATORY_CARE_PROVIDER_SITE_OTHER): Payer: Self-pay

## 2021-08-31 NOTE — Telephone Encounter (Signed)
LVM for pt advising that Dr. Delana Meyer is out of office and we would like for her to see Eulogio Ditch, NP. I asked for pt TCB and speak with [place].  ?

## 2021-09-05 ENCOUNTER — Encounter (INDEPENDENT_AMBULATORY_CARE_PROVIDER_SITE_OTHER): Payer: Medicare Other | Admitting: Vascular Surgery

## 2021-09-06 ENCOUNTER — Inpatient Hospital Stay: Payer: Medicare Other

## 2021-09-06 DIAGNOSIS — D708 Other neutropenia: Secondary | ICD-10-CM

## 2021-09-06 DIAGNOSIS — D72819 Decreased white blood cell count, unspecified: Secondary | ICD-10-CM

## 2021-09-06 DIAGNOSIS — D702 Other drug-induced agranulocytosis: Secondary | ICD-10-CM

## 2021-09-06 LAB — CBC WITH DIFFERENTIAL/PLATELET
Abs Immature Granulocytes: 0.03 10*3/uL (ref 0.00–0.07)
Basophils Absolute: 0 10*3/uL (ref 0.0–0.1)
Basophils Relative: 1 %
Eosinophils Absolute: 0 10*3/uL (ref 0.0–0.5)
Eosinophils Relative: 2 %
HCT: 44.2 % (ref 36.0–46.0)
Hemoglobin: 13.6 g/dL (ref 12.0–15.0)
Immature Granulocytes: 1 %
Lymphocytes Relative: 41 %
Lymphs Abs: 1.1 10*3/uL (ref 0.7–4.0)
MCH: 28.6 pg (ref 26.0–34.0)
MCHC: 30.8 g/dL (ref 30.0–36.0)
MCV: 93.1 fL (ref 80.0–100.0)
Monocytes Absolute: 0.4 10*3/uL (ref 0.1–1.0)
Monocytes Relative: 16 %
Neutro Abs: 1 10*3/uL — ABNORMAL LOW (ref 1.7–7.7)
Neutrophils Relative %: 39 %
Platelets: 295 10*3/uL (ref 150–400)
RBC: 4.75 MIL/uL (ref 3.87–5.11)
RDW: 15.9 % — ABNORMAL HIGH (ref 11.5–15.5)
WBC: 2.6 10*3/uL — ABNORMAL LOW (ref 4.0–10.5)
nRBC: 0 % (ref 0.0–0.2)

## 2021-09-06 MED ORDER — FILGRASTIM-SNDZ 480 MCG/0.8ML IJ SOSY
480.0000 ug | PREFILLED_SYRINGE | Freq: Once | INTRAMUSCULAR | Status: AC
  Administered 2021-09-06: 480 ug via SUBCUTANEOUS
  Filled 2021-09-06: qty 0.8

## 2021-09-07 DIAGNOSIS — R600 Localized edema: Secondary | ICD-10-CM | POA: Diagnosis not present

## 2021-09-13 ENCOUNTER — Inpatient Hospital Stay: Payer: Medicare Other

## 2021-09-13 DIAGNOSIS — D702 Other drug-induced agranulocytosis: Secondary | ICD-10-CM

## 2021-09-13 DIAGNOSIS — D708 Other neutropenia: Secondary | ICD-10-CM | POA: Diagnosis not present

## 2021-09-13 LAB — CBC WITH DIFFERENTIAL/PLATELET
Abs Immature Granulocytes: 0.19 10*3/uL — ABNORMAL HIGH (ref 0.00–0.07)
Basophils Absolute: 0 10*3/uL (ref 0.0–0.1)
Basophils Relative: 1 %
Eosinophils Absolute: 0 10*3/uL (ref 0.0–0.5)
Eosinophils Relative: 1 %
HCT: 46.1 % — ABNORMAL HIGH (ref 36.0–46.0)
Hemoglobin: 14.1 g/dL (ref 12.0–15.0)
Immature Granulocytes: 6 %
Lymphocytes Relative: 29 %
Lymphs Abs: 0.9 10*3/uL (ref 0.7–4.0)
MCH: 28.5 pg (ref 26.0–34.0)
MCHC: 30.6 g/dL (ref 30.0–36.0)
MCV: 93.1 fL (ref 80.0–100.0)
Monocytes Absolute: 0.2 10*3/uL (ref 0.1–1.0)
Monocytes Relative: 6 %
Neutro Abs: 1.8 10*3/uL (ref 1.7–7.7)
Neutrophils Relative %: 57 %
Platelets: 259 10*3/uL (ref 150–400)
RBC: 4.95 MIL/uL (ref 3.87–5.11)
RDW: 15.4 % (ref 11.5–15.5)
Smear Review: NORMAL
WBC: 3.2 10*3/uL — ABNORMAL LOW (ref 4.0–10.5)
nRBC: 0 % (ref 0.0–0.2)

## 2021-09-20 ENCOUNTER — Inpatient Hospital Stay: Payer: Medicare Other

## 2021-09-20 DIAGNOSIS — D708 Other neutropenia: Secondary | ICD-10-CM | POA: Diagnosis not present

## 2021-09-20 DIAGNOSIS — D72819 Decreased white blood cell count, unspecified: Secondary | ICD-10-CM

## 2021-09-20 DIAGNOSIS — D702 Other drug-induced agranulocytosis: Secondary | ICD-10-CM

## 2021-09-20 LAB — CBC WITH DIFFERENTIAL/PLATELET
Abs Immature Granulocytes: 0.02 10*3/uL (ref 0.00–0.07)
Basophils Absolute: 0 10*3/uL (ref 0.0–0.1)
Basophils Relative: 1 %
Eosinophils Absolute: 0 10*3/uL (ref 0.0–0.5)
Eosinophils Relative: 1 %
HCT: 44.6 % (ref 36.0–46.0)
Hemoglobin: 13.7 g/dL (ref 12.0–15.0)
Immature Granulocytes: 1 %
Lymphocytes Relative: 35 %
Lymphs Abs: 0.9 10*3/uL (ref 0.7–4.0)
MCH: 28.5 pg (ref 26.0–34.0)
MCHC: 30.7 g/dL (ref 30.0–36.0)
MCV: 92.9 fL (ref 80.0–100.0)
Monocytes Absolute: 0.3 10*3/uL (ref 0.1–1.0)
Monocytes Relative: 11 %
Neutro Abs: 1.3 10*3/uL — ABNORMAL LOW (ref 1.7–7.7)
Neutrophils Relative %: 51 %
Platelets: 217 10*3/uL (ref 150–400)
RBC: 4.8 MIL/uL (ref 3.87–5.11)
RDW: 15.4 % (ref 11.5–15.5)
WBC: 2.6 10*3/uL — ABNORMAL LOW (ref 4.0–10.5)
nRBC: 0 % (ref 0.0–0.2)

## 2021-09-20 MED ORDER — FILGRASTIM-SNDZ 480 MCG/0.8ML IJ SOSY
480.0000 ug | PREFILLED_SYRINGE | Freq: Once | INTRAMUSCULAR | Status: AC
  Administered 2021-09-20: 480 ug via SUBCUTANEOUS
  Filled 2021-09-20: qty 0.8

## 2021-09-28 ENCOUNTER — Encounter: Payer: Self-pay | Admitting: Internal Medicine

## 2021-09-28 ENCOUNTER — Inpatient Hospital Stay: Payer: Medicare Other | Admitting: Internal Medicine

## 2021-09-28 ENCOUNTER — Inpatient Hospital Stay: Payer: Medicare Other

## 2021-09-28 ENCOUNTER — Inpatient Hospital Stay: Payer: Medicare Other | Attending: Internal Medicine

## 2021-09-28 DIAGNOSIS — E785 Hyperlipidemia, unspecified: Secondary | ICD-10-CM | POA: Diagnosis not present

## 2021-09-28 DIAGNOSIS — D72819 Decreased white blood cell count, unspecified: Secondary | ICD-10-CM

## 2021-09-28 DIAGNOSIS — M316 Other giant cell arteritis: Secondary | ICD-10-CM | POA: Insufficient documentation

## 2021-09-28 DIAGNOSIS — D708 Other neutropenia: Secondary | ICD-10-CM | POA: Insufficient documentation

## 2021-09-28 DIAGNOSIS — Z800 Family history of malignant neoplasm of digestive organs: Secondary | ICD-10-CM | POA: Diagnosis not present

## 2021-09-28 DIAGNOSIS — Z87891 Personal history of nicotine dependence: Secondary | ICD-10-CM | POA: Diagnosis not present

## 2021-09-28 DIAGNOSIS — Z840 Family history of diseases of the skin and subcutaneous tissue: Secondary | ICD-10-CM | POA: Diagnosis not present

## 2021-09-28 DIAGNOSIS — Z79899 Other long term (current) drug therapy: Secondary | ICD-10-CM | POA: Insufficient documentation

## 2021-09-28 DIAGNOSIS — Z811 Family history of alcohol abuse and dependence: Secondary | ICD-10-CM | POA: Diagnosis not present

## 2021-09-28 DIAGNOSIS — D702 Other drug-induced agranulocytosis: Secondary | ICD-10-CM

## 2021-09-28 LAB — CBC WITH DIFFERENTIAL/PLATELET
Abs Immature Granulocytes: 0.06 10*3/uL (ref 0.00–0.07)
Basophils Absolute: 0 10*3/uL (ref 0.0–0.1)
Basophils Relative: 1 %
Eosinophils Absolute: 0 10*3/uL (ref 0.0–0.5)
Eosinophils Relative: 1 %
HCT: 46.8 % — ABNORMAL HIGH (ref 36.0–46.0)
Hemoglobin: 14.6 g/dL (ref 12.0–15.0)
Immature Granulocytes: 2 %
Lymphocytes Relative: 39 %
Lymphs Abs: 1.2 10*3/uL (ref 0.7–4.0)
MCH: 28.6 pg (ref 26.0–34.0)
MCHC: 31.2 g/dL (ref 30.0–36.0)
MCV: 91.6 fL (ref 80.0–100.0)
Monocytes Absolute: 0.2 10*3/uL (ref 0.1–1.0)
Monocytes Relative: 7 %
Neutro Abs: 1.4 10*3/uL — ABNORMAL LOW (ref 1.7–7.7)
Neutrophils Relative %: 50 %
Platelets: 227 10*3/uL (ref 150–400)
RBC: 5.11 MIL/uL (ref 3.87–5.11)
RDW: 15 % (ref 11.5–15.5)
Smear Review: NORMAL
WBC: 2.9 10*3/uL — ABNORMAL LOW (ref 4.0–10.5)
nRBC: 0 % (ref 0.0–0.2)

## 2021-09-28 LAB — BASIC METABOLIC PANEL
Anion gap: 8 (ref 5–15)
BUN: 20 mg/dL (ref 8–23)
CO2: 28 mmol/L (ref 22–32)
Calcium: 8.8 mg/dL — ABNORMAL LOW (ref 8.9–10.3)
Chloride: 102 mmol/L (ref 98–111)
Creatinine, Ser: 0.86 mg/dL (ref 0.44–1.00)
GFR, Estimated: >60 mL/min (ref >60)
Glucose, Bld: 99 mg/dL (ref 70–99)
Potassium: 3.6 mmol/L (ref 3.5–5.1)
Sodium: 138 mmol/L (ref 135–145)

## 2021-09-28 LAB — VITAMIN B12: Vitamin B-12: 2590 pg/mL — ABNORMAL HIGH (ref 180–914)

## 2021-09-28 MED ORDER — FILGRASTIM-SNDZ 480 MCG/0.8ML IJ SOSY
480.0000 ug | PREFILLED_SYRINGE | Freq: Once | INTRAMUSCULAR | Status: AC
  Administered 2021-09-28: 480 ug via SUBCUTANEOUS
  Filled 2021-09-28: qty 0.8

## 2021-09-28 NOTE — Progress Notes (Signed)
[place] ?CONSULT NOTE ? ?Patient Care Team: ?Baxter Hire, MD as PCP - General (Internal Medicine) ?Emmaline Kluver., MD (Rheumatology) ?Dasher, Rayvon Char, MD (Dermatology) ?Cammie Sickle, MD as Consulting Physician (Hematology and Oncology) ?Carloyn Manner, MD as Consulting Physician (Otolaryngology) ? ?CHIEF COMPLAINTS/PURPOSE OF CONSULTATION: Autoimmune neutropenia ? ?#April 2019- Autoimmune neutropenia-weekly Granix/CBC [Dr.Crocoran]; BMBx- no malignancy [Bone marrow aspirate and biopsy on 09/04/2017 revealed a hypercellular marrow for age with trilineage hematopoiesis.  There was abundant mature neutrophils.  Significant dyspoiesis or increased blasts was not identified.  Flow cytometry revealed a predominance of T lymphocytes (16% of all cells) with no abnormal phenotype.  There was a minor B-cell population (13% of lymphocytes) with slight kappa light chain excess.  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 ?#   temporal arteritis [2003] chronic prednisone/ 2.5 mg a day; Dr.Kernodle; SEP 2021- Prednisone '5mg'$  [refill] ? ?#April 2022-COVID infection [vaccinated]; EVUSHELD-undecided ? ?Oncology History  ? No history exists.  ? ? ?HISTORY OF PRESENTING ILLNESS:  ?[name] 87 y.o.  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 ? ?Patient has not needed any hospitalizations.  Has not needed any antibiotics.  Denies any recent infections. ? ?Patient continues to complain of easy bruising.   ? ?Review of Systems  ?Constitutional:  Negative for chills, diaphoresis, fever, malaise/fatigue and weight loss.  ?HENT:  Negative for nosebleeds and sore throat.   ?Eyes:  Negative for double vision.  ?Respiratory:  Negative for cough, hemoptysis, sputum  production, shortness of breath and wheezing.   ?Cardiovascular:  Negative for chest pain, palpitations and orthopnea.  ?Gastrointestinal:  Negative for blood in stool, constipation, diarrhea, heartburn, melena, nausea and vomiting.  ?Genitourinary:  Negative for dysuria, frequency and urgency.  ?Musculoskeletal:  Negative for back pain and joint pain.  ?Skin:  Negative for itching.  ?Neurological:  Positive for tingling. Negative for dizziness, focal weakness, weakness and headaches.  ?Endo/Heme/Allergies:  Bruises/bleeds easily.  ?Psychiatric/Behavioral:  Negative for depression. The patient is nervous/anxious. The patient does not have insomnia.    ? ?MEDICAL HISTORY:  ?Past Medical History:  ?Diagnosis Date  ? Breast cancer ([place]) 1985  ? bilateral mastectomies  ? Depression   ? 1 time specific situation that she did not know how to deal wiht  ? Diverticulosis   ? Hiatal hernia   ? History of Bell's palsy   ? History of colon polyps   ? History of COVID-19   ? History of nephrolithiasis   ? History of pancreatitis   ? Hyperlipidemia   ? IBS (irritable bowel syndrome)   ? Osteoporosis   ? osteopenia in neck  ? Skin cancer   ? Temporal arteritis ([place])   ? ? ?SURGICAL HISTORY: ?Past Surgical History:  ?Procedure Laterality Date  ? AUGMENTATION MAMMAPLASTY Bilateral 2000  ? COLON RESECTION SIGMOID N/A 10/13/2017  ? Procedure: COLON RESECTION SIGMOID;  Surgeon: Herbert Pun, MD;  Location: [facility] ORS;  Service: General;  Laterality: N/A;  ? COLOSTOMY N/A 10/13/2017  ? Procedure: COLOSTOMY;  Surgeon: Herbert Pun, MD;  Location: [facility] ORS;  Service: General;  Laterality: N/A;  ? LAPAROSCOPIC CHOLECYSTECTOMY  2009  ? MASTECTOMY  1983  ? bilateral  ? ? ?SOCIAL HISTORY: ?Social History  ? ?Socioeconomic History  ? Marital status: Widowed  ?  Spouse name: Not on file  ? Number of children: 2  ? Years of education: college  ? Highest education level: Not on file  ?Occupational History  ? Occupation: Engineer, production   ? Occupation: Retired  ?Tobacco Use  ? Smoking status: Former  ?  Packs/day: 0.25  ?  Years: 30.00  ?  Pack years: 7.50  ?  Types: Cigarettes  ?  Quit date: 05/17/2005  ?  Years since quitting: 16.3  ? Smokeless tobacco: Never  ? Tobacco comments:  ?  quit 11 years ago  ?Vaping Use  ? Vaping Use: Never used  ?Substance and Sexual Activity  ? Alcohol use: No  ?  Alcohol/week: 0.0 standard drinks  ? Drug use: No  ? Sexual activity: Not Currently  ?Other Topics Concern  ? Not on file  ?Social History Narrative  ? Patient lives at home alone.   ? Patient is retired.   ? Patient has some college.   ? Patient has 2 children.  ? ?Social Determinants of Health  ? ?Financial Resource Strain: Not on file  ?Food Insecurity: Not on file  ?Transportation Needs: Not on file  ?Physical Activity: Not on file  ?Stress: Not on file  ?Social Connections: Not on file  ?Intimate Partner Violence: Not on file  ? ? ?FAMILY HISTORY: ?Family History  ?Problem Relation Age of Onset  ? Colon cancer Mother 35  ? Scleroderma Father 24  ? Alcoholism Brother   ? Stomach cancer Neg Hx   ? ? ?ALLERGIES:  is allergic to morphine, benzalkonium chloride, escitalopram, hydralazine, neomycin-bacitracin zn-polymyx, and bacitracin-neomycin-polymyxin. ? ?MEDICATIONS:  ?Current Outpatient Medications  ?Medication Sig Dispense Refill  ? alendronate (FOSAMAX) 70 MG tablet Take 70 mg by mouth once a week.    ? ALPRAZolam (XANAX) 0.25 MG tablet Take 0.25 mg by mouth 2 (two) times daily as needed.     ? Ascorbic Acid (VITAMIN C) 1000 MG tablet Take 1,000 mg by mouth daily.    ? aspirin EC 81 MG tablet Take 81 mg daily by mouth.    ? b complex vitamins capsule Take 1 capsule by mouth daily.    ? Biotin 1000 MCG tablet Take 1,000 mcg by mouth daily.     ? Cholecalciferol (VITAMIN D3) 2000 units capsule Take 2,000 Units by mouth daily.    ? Cyanocobalamin (B-12 PO) Take 2,500 mcg by mouth daily.    ? filgrastim-sndz (ZARXIO) 480 MCG/0.8ML SOSY injection Inject 0.8  mLs (480 mcg total) into the skin once a week. Administer injection subcutaneously 3.2 mL 6  ? predniSONE (DELTASONE) 5 MG tablet Take 5 mg by mouth daily.    ? traMADol (ULTRAM) 50 MG tablet Take 50 mg by mouth daily.    ? ZINC OXIDE PO Take 1 tablet by mouth daily.    ? amLODipine (NORVASC) 5 MG tablet Take 5 mg by mouth daily. Pt states that she she increased the tablet to 5 mg daily. She was taking 2.5 mg. She stated that she thought the half tablet was not working efficiently. I asked the patient to inform her pcp that her dosage was increased.    ? ?No current facility-administered medications for this visit.  ? ? ?PHYSICAL EXAMINATION: ?ECOG PERFORMANCE STATUS: 0 - Asymptomatic ? ?Vitals:  ? 09/28/21 1017  ?BP: (!) 153/66  ?Pulse: 85  ?Temp: 98.3 ?F (36.8 ?C)  ?SpO2: 100%  ? ?Filed Weights  ? 09/28/21 1017  ?Weight: 166 lb 12.8 oz (75.7 kg)  ? ? ?  Physical Exam ?Constitutional:   ?   Comments: Alone.  ?HENT:  ?   Head: Normocephalic and atraumatic.  ?   Mouth/Throat:  ?   Pharynx: No oropharyngeal exudate.  ?Eyes:  ?   Pupils: Pupils are equal, round, and reactive to light.  ?Cardiovascular:  ?   Rate and Rhythm: Normal rate and regular rhythm.  ?Pulmonary:  ?   Effort: No respiratory distress.  ?   Breath sounds: No wheezing.  ?Abdominal:  ?   General: Bowel sounds are normal. There is no distension.  ?   Palpations: Abdomen is soft. There is no mass.  ?   Tenderness: There is no abdominal tenderness. There is no guarding or rebound.  ?   Comments: Colostomy.  Parastomal hernia.  ?Musculoskeletal:     ?   General: No tenderness. Normal range of motion.  ?   Cervical back: Normal range of motion and neck supple.  ?Skin: ?   General: Skin is warm.  ?   Comments: Hyperpigmented rash noted bilateral shins. ? ?Right arm bruising/thin skin.  ?Neurological:  ?   Mental Status: She is alert and oriented to person, place, and time.  ?Psychiatric:     ?   Mood and Affect: Affect normal.  ?   Comments: Anxious.   ? ? ? ?LABORATORY DATA:  ?I have reviewed the data as listed ?Lab Results  ?Component Value Date  ? WBC 2.9 (L) 09/28/2021  ? HGB 14.6 09/28/2021  ? HCT 46.8 (H) 09/28/2021  ? MCV 91.6 09/28/2021  ? PLT 227 05/

## 2021-09-28 NOTE — Progress Notes (Signed)
? ? ? ? ?MRN : [id number] ? ?[name] is a 87 y.o. (1934-09-22) female who presents with chief complaint of check circulation. ? ?History of Present Illness:  ? The patient is seen for evaluation of painful lower extremities and diminished pulses. Patient notes the pain is always associated with activity and is very consistent day today. Typically, the pain occurs at less than one block, progress is as activity continues to the point that the patient must stop walking. Resting including standing still for several minutes allows the patient to walk a similar distance before being forced to stop again. Uneven terrain and inclines shorten the distance. The pain has been progressive over the past several years. The patient denies any abrupt changes in claudication symptoms.  The patient states the inability to walk is causing problems with daily activities. ? ?The patient is also noting extensive discoloration in the ankles bilaterally this is associated with swelling that is worse as the day progresses.  In the past she has worn compression but is gotten away from this.  The patient denies history of DVT, PE or superficial thrombophlebitis. ? ?The patient denies rest pain or dangling of an extremity off the side of the bed during the night for relief. ?No open wounds or sores at this time. ?No prior interventions or surgeries. ? ?No history of back problems or DJD of the lumbar sacral spine.  ? ?The patient's blood pressure has been stable and relatively well controlled. ?The patient denies amaurosis fugax or recent TIA symptoms. There are no recent neurological changes noted. ?The patient denies recent episodes of angina or shortness of breath.   ? ?ABIs dated 08/08/2021 right equals 0.79 and left equals 0.76 there are biphasic Doppler signals bilaterally ? ?No outpatient medications have been marked as taking for the 09/29/21 encounter (Appointment) with Delana Meyer, Dolores Lory, MD.  ? ? ?Past Medical History:   ?Diagnosis Date  ? Breast cancer ([place]) 1985  ? bilateral mastectomies  ? Depression   ? 1 time specific situation that she did not know how to deal wiht  ? Diverticulosis   ? Hiatal hernia   ? History of Bell's palsy   ? History of colon polyps   ? History of COVID-19   ? History of nephrolithiasis   ? History of pancreatitis   ? Hyperlipidemia   ? IBS (irritable bowel syndrome)   ? Osteoporosis   ? osteopenia in neck  ? Skin cancer   ? Temporal arteritis ([place])   ? ? ?Past Surgical History:  ?Procedure Laterality Date  ? AUGMENTATION MAMMAPLASTY Bilateral 2000  ? COLON RESECTION SIGMOID N/A 10/13/2017  ? Procedure: COLON RESECTION SIGMOID;  Surgeon: Herbert Pun, MD;  Location: [facility];  Service: General;  Laterality: N/A;  ? COLOSTOMY N/A 10/13/2017  ? Procedure: COLOSTOMY;  Surgeon: Herbert Pun, MD;  Location: [facility] OR[facility];  Service: General;  Laterality: N/A;  ? LAPAROSCOPIC CHOLECYSTECTOMY  2009  ? MASTECTOMY  1983  ? bilateral  ? ? ?Social History ?Social History  ? ?Tobacco Use  ? Smoking status: Former  ?  Packs/day: 0.25  ?  Years: 30.00  ?  Pack years: 7.50  ?  Types: Cigarettes  ?  Quit date: 05/17/2005  ?  Years since quitting: 16.3  ? Smokeless tobacco: Never  ? Tobacco comments:  ?  quit 11 years ago  ?Vaping Use  ? Vaping Use: Never used  ?Substance Use Topics  ? Alcohol use: No  ?  Alcohol/week:  0.0 standard drinks  ? Drug use: No  ? ? ?Family History ?Family History  ?Problem Relation Age of Onset  ? Colon cancer Mother 77  ? Scleroderma Father 8  ? Alcoholism Brother   ? Stomach cancer Neg Hx   ? ? ?Allergies  ?Allergen Reactions  ? Morphine Other (See Comments)  ?  "done not work, makes me scream."  ? Benzalkonium Chloride Dermatitis  ?  Other reaction(s): Unknown  ? Escitalopram   ?  Deveioped a twitch  ? Hydralazine   ?  Causes sx of heart pounding  ? Neomycin-Bacitracin Zn-Polymyx Dermatitis  ?  Other reaction(s): Other (See Comments) ?REACTION: blisters skin ?REACTION: blisters  skin ?REACTION: blisters skin  ? Bacitracin-Neomycin-Polymyxin Dermatitis  ?  REACTION: blisters skin  ? ? ? ?REVIEW OF SYSTEMS (Negative unless checked) ? ?Constitutional: '[]'$ Weight loss  '[]'$ Fever  '[]'$ Chills ?Cardiac: '[]'$ Chest pain   '[]'$ Chest pressure   '[]'$ Palpitations   '[]'$ Shortness of breath when laying flat   '[]'$ Shortness of breath with exertion. ?Vascular:  '[x]'$ Pain in legs with walking   '[]'$ Pain in legs at rest  '[]'$ History of DVT   '[]'$ Phlebitis   '[]'$ Swelling in legs   '[x]'$ Varicose veins   '[]'$ Non-healing ulcers ?Pulmonary:   '[]'$ Uses home oxygen   '[]'$ Productive cough   '[]'$ Hemoptysis   '[]'$ Wheeze  '[]'$ COPD   '[]'$ Asthma ?Neurologic:  '[]'$ Dizziness   '[]'$ Seizures   '[]'$ History of stroke   '[]'$ History of TIA  '[]'$ Aphasia   '[]'$ Vissual changes   '[]'$ Weakness or numbness in arm   '[]'$ Weakness or numbness in leg ?Musculoskeletal:   '[]'$ Joint swelling   '[]'$ Joint pain   '[]'$ Low back pain ?Hematologic:  '[]'$ Easy bruising  '[]'$ Easy bleeding   '[]'$ Hypercoagulable state   '[]'$ Anemic ?Gastrointestinal:  '[]'$ Diarrhea   '[]'$ Vomiting  '[x]'$ Gastroesophageal reflux/heartburn   '[]'$ Difficulty swallowing. ?Genitourinary:  '[]'$ Chronic kidney disease   '[]'$ Difficult urination  '[]'$ Frequent urination   '[]'$ Blood in urine ?Skin:  '[x]'$ Rashes   '[]'$ Ulcers  ?Psychological:  '[]'$ History of anxiety   '[]'$  History of major depression. ? ?Physical Examination ? ?There were no vitals filed for this visit. ?There is no height or weight on file to calculate BMI. ?Gen: WD/WN, NAD ?Head: [place]/AT, No temporalis wasting.  ?Ear/Nose/Throat: Hearing grossly intact, nares w/o erythema or drainage ?Eyes: PER, EOMI, sclera nonicteric.  ?Neck: Supple, no masses.  No bruit or JVD.  ?Pulmonary:  Good air movement, no audible wheezing, no use of accessory muscles.  ?Cardiac: RRR, normal S1, S2, no Murmurs. ?Vascular:  mild trophic changes, no open wounds moderate to severe venous stasis dermatitis with discoloration left is somewhat worse than the right.  There is 1-2+ edema again worse on the left compared to the right.  There are  diffuse small and moderate size varicosities particularly in the ankles bilaterally ?Vessel Right Left  ?Radial Palpable Palpable  ?PT Not Palpable Not Palpable  ?DP Not Palpable Not Palpable  ?Gastrointestinal: soft, non-distended. No guarding/no peritoneal signs.  ?Musculoskeletal: M/S 5/5 throughout.  No visible deformity.  ?Neurologic: CN 2-12 intact. Pain and light touch intact in extremities.  Symmetrical.  Speech is fluent. Motor exam as listed above. ?Psychiatric: Judgment intact, Mood & affect appropriate for pt's clinical situation. ?Dermatologic: No rashes or ulcers noted.  No changes consistent with cellulitis. ? ? ?CBC ?Lab Results  ?Component Value Date  ? WBC 2.9 (L) 09/28/2021  ? HGB 14.6 09/28/2021  ? HCT 46.8 (H) 09/28/2021  ? MCV 91.6 09/28/2021  ? PLT 227 09/28/2021  ? ? ?BMET ?   ?Component Value  Date/Time  ? NA 138 09/28/2021 1008  ? NA 140 08/12/2012 0430  ? K 3.6 09/28/2021 1008  ? K 3.9 08/12/2012 0430  ? CL 102 09/28/2021 1008  ? CL 107 08/12/2012 0430  ? CO2 28 09/28/2021 1008  ? CO2 28 08/12/2012 0430  ? GLUCOSE 99 09/28/2021 1008  ? GLUCOSE 80 08/12/2012 0430  ? BUN 20 09/28/2021 1008  ? BUN 11 08/12/2012 0430  ? CREATININE 0.86 09/28/2021 1008  ? CREATININE 0.78 08/12/2012 0430  ? CALCIUM 8.8 (L) 09/28/2021 1008  ? CALCIUM 7.9 (L) 08/12/2012 0430  ? GFRNONAA >60 09/28/2021 1008  ? GFRNONAA >60 08/12/2012 0430  ? GFRAA 58 (L) 02/17/2020 0955  ? GFRAA >60 08/12/2012 0430  ? ?Estimated Creatinine Clearance: 48.7 mL/min (by C-G formula based on SCr of 0.86 mg/dL). ? ?COAG ?Lab Results  ?Component Value Date  ? INR 0.92 09/21/2017  ? INR 0.87 09/04/2017  ? INR 0.9 08/11/2012  ? ? ?Radiology ?No results found. ? ? ?Assessment/Plan ?1. Atherosclerosis of native artery of left lower extremity with intermittent claudication ([place]) ? Recommend: ? ?The patient has evidence of atherosclerosis of the lower extremities with claudication.  The patient does not voice lifestyle limiting changes at this  point in time. ? ?Noninvasive studies do not suggest clinically significant change.  Recent ABIs dated 08/08/2021 right equals 0.79 and left equals 0.76 there are biphasic Doppler signals bilaterally ? ?No inv

## 2021-09-28 NOTE — Assessment & Plan Note (Addendum)
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 ? ?# Patient's WBC- today-2.8; ANC- 1.2 Continue zarxio today; and  continue weekly zarxio.  Patient clinically asymptomatic.  Continue cbc/zarxioweekly/pt pref.STABLE; continue prednisone 5 mg/day [re- fill]; continue at current dose.  ? ?# Right LE infection s/p excision re: Skin cancer [Dr.Dasher]- STABLE.  ? ?# Elevated BP [at home ~130]- STABLE.  ? ?# Hx of temporal arteritis->20 years on low dose prednisone-STABLE.   ? ?# Easy burising Bil LE/Upper extremity: sec to prednisone/senile purpura. STABLE.  ? ?# DISPOSITION: ?# proceed with ZARXIO today ?# weekly cbc/zaxio x 12 ?# follow up with MD in 12  Weeks; - cbc/bmp/ b12 levels-zarxio-Dr.B ?

## 2021-09-28 NOTE — Assessment & Plan Note (Deleted)
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  Patient clinically asymptomatic.  Continue cbc/zarxioweekly/pt pref.STABLE; continue prednisone 5 mg/day [re- fill]; continue at current dose.  ? ?# Right LE infection s/p excision re: Skin cancer [Dr.Dasher]- STABLE.  ? ?# Elevated BP [at home ~130]- STABLE.  ? ?# Hx of temporal arteritis->20 years on low dose prednisone-STABLE.  ? ?# Easy burising Bil LE/Upper extremity: sec to prednisone/senile purpura. STABLE.  ? ?# Left foot numbness- intermittent- defer to PCP.  ? ?# DISPOSITION: ?# proceed with ZARXIO today ?# weekly cbc/zaxio x 12 ?# follow up with MD in 12  Weeks; - cbc/bmp/ b12 levels-zarxio-Dr.B ?

## 2021-09-28 NOTE — Progress Notes (Signed)
Pt has a bruise on her left forearm, she doesn't remember hitting the arm but did fall 08/17/21. ?

## 2021-09-29 ENCOUNTER — Ambulatory Visit (INDEPENDENT_AMBULATORY_CARE_PROVIDER_SITE_OTHER): Payer: Medicare Other | Admitting: Vascular Surgery

## 2021-09-29 ENCOUNTER — Encounter (INDEPENDENT_AMBULATORY_CARE_PROVIDER_SITE_OTHER): Payer: Self-pay

## 2021-09-29 ENCOUNTER — Encounter (INDEPENDENT_AMBULATORY_CARE_PROVIDER_SITE_OTHER): Payer: Self-pay | Admitting: Vascular Surgery

## 2021-09-29 VITALS — BP 148/72 | HR 90 | Resp 16 | Wt 167.0 lb

## 2021-09-29 DIAGNOSIS — K219 Gastro-esophageal reflux disease without esophagitis: Secondary | ICD-10-CM | POA: Diagnosis not present

## 2021-09-29 DIAGNOSIS — M159 Polyosteoarthritis, unspecified: Secondary | ICD-10-CM

## 2021-09-29 DIAGNOSIS — E785 Hyperlipidemia, unspecified: Secondary | ICD-10-CM

## 2021-09-29 DIAGNOSIS — I872 Venous insufficiency (chronic) (peripheral): Secondary | ICD-10-CM

## 2021-09-29 DIAGNOSIS — I70212 Atherosclerosis of native arteries of extremities with intermittent claudication, left leg: Secondary | ICD-10-CM

## 2021-10-04 ENCOUNTER — Inpatient Hospital Stay: Payer: Medicare Other

## 2021-10-04 DIAGNOSIS — D708 Other neutropenia: Secondary | ICD-10-CM | POA: Diagnosis not present

## 2021-10-04 DIAGNOSIS — D702 Other drug-induced agranulocytosis: Secondary | ICD-10-CM

## 2021-10-04 LAB — CBC WITH DIFFERENTIAL/PLATELET
Abs Immature Granulocytes: 0.02 10*3/uL (ref 0.00–0.07)
Basophils Absolute: 0 10*3/uL (ref 0.0–0.1)
Basophils Relative: 1 %
Eosinophils Absolute: 0 10*3/uL (ref 0.0–0.5)
Eosinophils Relative: 1 %
HCT: 44.3 % (ref 36.0–46.0)
Hemoglobin: 13.7 g/dL (ref 12.0–15.0)
Immature Granulocytes: 1 %
Lymphocytes Relative: 32 %
Lymphs Abs: 0.9 10*3/uL (ref 0.7–4.0)
MCH: 28.5 pg (ref 26.0–34.0)
MCHC: 30.9 g/dL (ref 30.0–36.0)
MCV: 92.3 fL (ref 80.0–100.0)
Monocytes Absolute: 0.3 10*3/uL (ref 0.1–1.0)
Monocytes Relative: 10 %
Neutro Abs: 1.6 10*3/uL — ABNORMAL LOW (ref 1.7–7.7)
Neutrophils Relative %: 55 %
Platelets: 228 10*3/uL (ref 150–400)
RBC: 4.8 MIL/uL (ref 3.87–5.11)
RDW: 15 % (ref 11.5–15.5)
WBC: 2.8 10*3/uL — ABNORMAL LOW (ref 4.0–10.5)
nRBC: 0 % (ref 0.0–0.2)

## 2021-10-11 ENCOUNTER — Inpatient Hospital Stay: Payer: Medicare Other

## 2021-10-11 DIAGNOSIS — D702 Other drug-induced agranulocytosis: Secondary | ICD-10-CM

## 2021-10-11 DIAGNOSIS — D708 Other neutropenia: Secondary | ICD-10-CM | POA: Diagnosis not present

## 2021-10-11 DIAGNOSIS — D72819 Decreased white blood cell count, unspecified: Secondary | ICD-10-CM

## 2021-10-11 LAB — CBC WITH DIFFERENTIAL/PLATELET
Abs Immature Granulocytes: 0.04 10*3/uL (ref 0.00–0.07)
Basophils Absolute: 0 10*3/uL (ref 0.0–0.1)
Basophils Relative: 1 %
Eosinophils Absolute: 0 10*3/uL (ref 0.0–0.5)
Eosinophils Relative: 1 %
HCT: 44.6 % (ref 36.0–46.0)
Hemoglobin: 13.8 g/dL (ref 12.0–15.0)
Immature Granulocytes: 1 %
Lymphocytes Relative: 47 %
Lymphs Abs: 1.4 10*3/uL (ref 0.7–4.0)
MCH: 28.6 pg (ref 26.0–34.0)
MCHC: 30.9 g/dL (ref 30.0–36.0)
MCV: 92.5 fL (ref 80.0–100.0)
Monocytes Absolute: 0.3 10*3/uL (ref 0.1–1.0)
Monocytes Relative: 12 %
Neutro Abs: 1.1 10*3/uL — ABNORMAL LOW (ref 1.7–7.7)
Neutrophils Relative %: 38 %
Platelets: 226 10*3/uL (ref 150–400)
RBC: 4.82 MIL/uL (ref 3.87–5.11)
RDW: 15 % (ref 11.5–15.5)
WBC: 2.9 10*3/uL — ABNORMAL LOW (ref 4.0–10.5)
nRBC: 0 % (ref 0.0–0.2)

## 2021-10-11 MED ORDER — FILGRASTIM-SNDZ 480 MCG/0.8ML IJ SOSY
480.0000 ug | PREFILLED_SYRINGE | Freq: Once | INTRAMUSCULAR | Status: AC
  Administered 2021-10-11: 480 ug via SUBCUTANEOUS
  Filled 2021-10-11: qty 0.8

## 2021-10-18 ENCOUNTER — Inpatient Hospital Stay: Payer: Medicare Other

## 2021-10-18 DIAGNOSIS — D72819 Decreased white blood cell count, unspecified: Secondary | ICD-10-CM

## 2021-10-18 DIAGNOSIS — D708 Other neutropenia: Secondary | ICD-10-CM

## 2021-10-18 DIAGNOSIS — D702 Other drug-induced agranulocytosis: Secondary | ICD-10-CM

## 2021-10-18 LAB — CBC WITH DIFFERENTIAL/PLATELET
Abs Immature Granulocytes: 0.16 10*3/uL — ABNORMAL HIGH (ref 0.00–0.07)
Basophils Absolute: 0 10*3/uL (ref 0.0–0.1)
Basophils Relative: 1 %
Eosinophils Absolute: 0 10*3/uL (ref 0.0–0.5)
Eosinophils Relative: 1 %
HCT: 45.8 % (ref 36.0–46.0)
Hemoglobin: 14.3 g/dL (ref 12.0–15.0)
Immature Granulocytes: 6 %
Lymphocytes Relative: 45 %
Lymphs Abs: 1.3 10*3/uL (ref 0.7–4.0)
MCH: 28.8 pg (ref 26.0–34.0)
MCHC: 31.2 g/dL (ref 30.0–36.0)
MCV: 92.3 fL (ref 80.0–100.0)
Monocytes Absolute: 0.2 10*3/uL (ref 0.1–1.0)
Monocytes Relative: 8 %
Neutro Abs: 1.1 10*3/uL — ABNORMAL LOW (ref 1.7–7.7)
Neutrophils Relative %: 39 %
Platelets: 237 10*3/uL (ref 150–400)
RBC: 4.96 MIL/uL (ref 3.87–5.11)
RDW: 15 % (ref 11.5–15.5)
Smear Review: NORMAL
WBC: 2.8 10*3/uL — ABNORMAL LOW (ref 4.0–10.5)
nRBC: 0 % (ref 0.0–0.2)

## 2021-10-18 MED ORDER — FILGRASTIM-SNDZ 480 MCG/0.8ML IJ SOSY
480.0000 ug | PREFILLED_SYRINGE | Freq: Once | INTRAMUSCULAR | Status: AC
  Administered 2021-10-18: 480 ug via SUBCUTANEOUS
  Filled 2021-10-18: qty 0.8

## 2021-10-25 ENCOUNTER — Inpatient Hospital Stay: Payer: Medicare Other

## 2021-10-25 DIAGNOSIS — D72819 Decreased white blood cell count, unspecified: Secondary | ICD-10-CM

## 2021-10-25 DIAGNOSIS — D708 Other neutropenia: Secondary | ICD-10-CM | POA: Diagnosis not present

## 2021-10-25 DIAGNOSIS — D702 Other drug-induced agranulocytosis: Secondary | ICD-10-CM

## 2021-10-25 LAB — CBC WITH DIFFERENTIAL/PLATELET
Abs Immature Granulocytes: 0.04 10*3/uL (ref 0.00–0.07)
Basophils Absolute: 0 10*3/uL (ref 0.0–0.1)
Basophils Relative: 1 %
Eosinophils Absolute: 0.1 10*3/uL (ref 0.0–0.5)
Eosinophils Relative: 2 %
HCT: 44.1 % (ref 36.0–46.0)
Hemoglobin: 14 g/dL (ref 12.0–15.0)
Immature Granulocytes: 2 %
Lymphocytes Relative: 49 %
Lymphs Abs: 1.3 10*3/uL (ref 0.7–4.0)
MCH: 29.2 pg (ref 26.0–34.0)
MCHC: 31.7 g/dL (ref 30.0–36.0)
MCV: 91.9 fL (ref 80.0–100.0)
Monocytes Absolute: 0.3 10*3/uL (ref 0.1–1.0)
Monocytes Relative: 12 %
Neutro Abs: 0.9 10*3/uL — ABNORMAL LOW (ref 1.7–7.7)
Neutrophils Relative %: 34 %
Platelets: 201 10*3/uL (ref 150–400)
RBC: 4.8 MIL/uL (ref 3.87–5.11)
RDW: 15 % (ref 11.5–15.5)
Smear Review: NORMAL
WBC: 2.6 10*3/uL — ABNORMAL LOW (ref 4.0–10.5)
nRBC: 0 % (ref 0.0–0.2)

## 2021-10-25 MED ORDER — FILGRASTIM-SNDZ 480 MCG/0.8ML IJ SOSY
480.0000 ug | PREFILLED_SYRINGE | Freq: Once | INTRAMUSCULAR | Status: AC
  Administered 2021-10-25: 480 ug via SUBCUTANEOUS
  Filled 2021-10-25: qty 0.8

## 2021-11-01 ENCOUNTER — Inpatient Hospital Stay: Payer: Medicare Other

## 2021-11-01 ENCOUNTER — Inpatient Hospital Stay: Payer: Medicare Other | Attending: Internal Medicine

## 2021-11-01 DIAGNOSIS — D708 Other neutropenia: Secondary | ICD-10-CM | POA: Diagnosis present

## 2021-11-01 DIAGNOSIS — Z79899 Other long term (current) drug therapy: Secondary | ICD-10-CM | POA: Insufficient documentation

## 2021-11-01 DIAGNOSIS — D702 Other drug-induced agranulocytosis: Secondary | ICD-10-CM

## 2021-11-01 LAB — CBC WITH DIFFERENTIAL/PLATELET
Abs Immature Granulocytes: 0 10*3/uL (ref 0.00–0.07)
Basophils Absolute: 0 10*3/uL (ref 0.0–0.1)
Basophils Relative: 1 %
Eosinophils Absolute: 0 10*3/uL (ref 0.0–0.5)
Eosinophils Relative: 1 %
HCT: 46.4 % — ABNORMAL HIGH (ref 36.0–46.0)
Hemoglobin: 14.6 g/dL (ref 12.0–15.0)
Lymphocytes Relative: 29 %
Lymphs Abs: 0.8 10*3/uL (ref 0.7–4.0)
MCH: 28.9 pg (ref 26.0–34.0)
MCHC: 31.5 g/dL (ref 30.0–36.0)
MCV: 91.9 fL (ref 80.0–100.0)
Monocytes Absolute: 0.2 10*3/uL (ref 0.1–1.0)
Monocytes Relative: 9 %
Neutro Abs: 1.6 10*3/uL — ABNORMAL LOW (ref 1.7–7.7)
Neutrophils Relative %: 60 %
Platelets: 219 10*3/uL (ref 150–400)
RBC: 5.05 MIL/uL (ref 3.87–5.11)
RDW: 15.2 % (ref 11.5–15.5)
Smear Review: NORMAL
WBC: 2.7 10*3/uL — ABNORMAL LOW (ref 4.0–10.5)
nRBC: 0 % (ref 0.0–0.2)

## 2021-11-08 ENCOUNTER — Inpatient Hospital Stay: Payer: Medicare Other

## 2021-11-08 ENCOUNTER — Other Ambulatory Visit: Payer: Self-pay

## 2021-11-08 DIAGNOSIS — D702 Other drug-induced agranulocytosis: Secondary | ICD-10-CM

## 2021-11-08 DIAGNOSIS — D708 Other neutropenia: Secondary | ICD-10-CM

## 2021-11-08 DIAGNOSIS — D72819 Decreased white blood cell count, unspecified: Secondary | ICD-10-CM

## 2021-11-08 DIAGNOSIS — Z79899 Other long term (current) drug therapy: Secondary | ICD-10-CM | POA: Diagnosis not present

## 2021-11-08 LAB — CBC WITH DIFFERENTIAL/PLATELET
Abs Immature Granulocytes: 0.01 10*3/uL (ref 0.00–0.07)
Basophils Absolute: 0 10*3/uL (ref 0.0–0.1)
Basophils Relative: 1 %
Eosinophils Absolute: 0.1 10*3/uL (ref 0.0–0.5)
Eosinophils Relative: 2 %
HCT: 45.9 % (ref 36.0–46.0)
Hemoglobin: 14.3 g/dL (ref 12.0–15.0)
Immature Granulocytes: 0 %
Lymphocytes Relative: 52 %
Lymphs Abs: 1.3 10*3/uL (ref 0.7–4.0)
MCH: 28.5 pg (ref 26.0–34.0)
MCHC: 31.2 g/dL (ref 30.0–36.0)
MCV: 91.4 fL (ref 80.0–100.0)
Monocytes Absolute: 0.4 10*3/uL (ref 0.1–1.0)
Monocytes Relative: 14 %
Neutro Abs: 0.8 10*3/uL — ABNORMAL LOW (ref 1.7–7.7)
Neutrophils Relative %: 31 %
Platelets: 241 10*3/uL (ref 150–400)
RBC: 5.02 MIL/uL (ref 3.87–5.11)
RDW: 15.2 % (ref 11.5–15.5)
WBC: 2.5 10*3/uL — ABNORMAL LOW (ref 4.0–10.5)
nRBC: 0 % (ref 0.0–0.2)

## 2021-11-08 MED ORDER — FILGRASTIM-SNDZ 480 MCG/0.8ML IJ SOSY
480.0000 ug | PREFILLED_SYRINGE | Freq: Once | INTRAMUSCULAR | Status: AC
  Administered 2021-11-08: 480 ug via SUBCUTANEOUS
  Filled 2021-11-08: qty 0.8

## 2021-11-15 ENCOUNTER — Other Ambulatory Visit: Payer: Self-pay

## 2021-11-15 ENCOUNTER — Inpatient Hospital Stay: Payer: Medicare Other

## 2021-11-15 DIAGNOSIS — D708 Other neutropenia: Secondary | ICD-10-CM | POA: Diagnosis not present

## 2021-11-15 DIAGNOSIS — D72819 Decreased white blood cell count, unspecified: Secondary | ICD-10-CM

## 2021-11-15 DIAGNOSIS — D702 Other drug-induced agranulocytosis: Secondary | ICD-10-CM

## 2021-11-15 LAB — CBC WITH DIFFERENTIAL/PLATELET
Abs Immature Granulocytes: 0 10*3/uL (ref 0.00–0.07)
Basophils Absolute: 0 10*3/uL (ref 0.0–0.1)
Basophils Relative: 1 %
Eosinophils Absolute: 0.1 10*3/uL (ref 0.0–0.5)
Eosinophils Relative: 2 %
HCT: 46.3 % — ABNORMAL HIGH (ref 36.0–46.0)
Hemoglobin: 14.6 g/dL (ref 12.0–15.0)
Immature Granulocytes: 0 %
Lymphocytes Relative: 44 %
Lymphs Abs: 1.4 10*3/uL (ref 0.7–4.0)
MCH: 28.7 pg (ref 26.0–34.0)
MCHC: 31.5 g/dL (ref 30.0–36.0)
MCV: 91 fL (ref 80.0–100.0)
Monocytes Absolute: 0.3 10*3/uL (ref 0.1–1.0)
Monocytes Relative: 9 %
Neutro Abs: 1.4 10*3/uL — ABNORMAL LOW (ref 1.7–7.7)
Neutrophils Relative %: 44 %
Platelets: 223 10*3/uL (ref 150–400)
RBC: 5.09 MIL/uL (ref 3.87–5.11)
RDW: 15.1 % (ref 11.5–15.5)
Smear Review: NORMAL
WBC: 3.1 10*3/uL — ABNORMAL LOW (ref 4.0–10.5)
nRBC: 0 % (ref 0.0–0.2)

## 2021-11-15 MED ORDER — FILGRASTIM-SNDZ 480 MCG/0.8ML IJ SOSY
480.0000 ug | PREFILLED_SYRINGE | Freq: Once | INTRAMUSCULAR | Status: AC
  Administered 2021-11-15: 480 ug via SUBCUTANEOUS
  Filled 2021-11-15: qty 0.8

## 2021-11-22 ENCOUNTER — Inpatient Hospital Stay: Payer: Medicare Other

## 2021-11-22 DIAGNOSIS — D702 Other drug-induced agranulocytosis: Secondary | ICD-10-CM

## 2021-11-22 DIAGNOSIS — D708 Other neutropenia: Secondary | ICD-10-CM

## 2021-11-22 DIAGNOSIS — D72819 Decreased white blood cell count, unspecified: Secondary | ICD-10-CM

## 2021-11-22 LAB — CBC WITH DIFFERENTIAL/PLATELET
Abs Immature Granulocytes: 0 10*3/uL (ref 0.00–0.07)
Basophils Absolute: 0 10*3/uL (ref 0.0–0.1)
Basophils Relative: 1 %
Eosinophils Absolute: 0.1 10*3/uL (ref 0.0–0.5)
Eosinophils Relative: 3 %
HCT: 47.7 % — ABNORMAL HIGH (ref 36.0–46.0)
Hemoglobin: 14.7 g/dL (ref 12.0–15.0)
Immature Granulocytes: 0 %
Lymphocytes Relative: 51 %
Lymphs Abs: 1.4 10*3/uL (ref 0.7–4.0)
MCH: 28.3 pg (ref 26.0–34.0)
MCHC: 30.8 g/dL (ref 30.0–36.0)
MCV: 91.7 fL (ref 80.0–100.0)
Monocytes Absolute: 0.3 10*3/uL (ref 0.1–1.0)
Monocytes Relative: 12 %
Neutro Abs: 0.9 10*3/uL — ABNORMAL LOW (ref 1.7–7.7)
Neutrophils Relative %: 33 %
Platelets: 208 10*3/uL (ref 150–400)
RBC: 5.2 MIL/uL — ABNORMAL HIGH (ref 3.87–5.11)
RDW: 15.2 % (ref 11.5–15.5)
Smear Review: NORMAL
WBC: 2.7 10*3/uL — ABNORMAL LOW (ref 4.0–10.5)
nRBC: 0 % (ref 0.0–0.2)

## 2021-11-22 MED ORDER — FILGRASTIM-SNDZ 480 MCG/0.8ML IJ SOSY
480.0000 ug | PREFILLED_SYRINGE | Freq: Once | INTRAMUSCULAR | Status: AC
  Administered 2021-11-22: 480 ug via SUBCUTANEOUS
  Filled 2021-11-22: qty 0.8

## 2021-11-30 ENCOUNTER — Inpatient Hospital Stay: Payer: Medicare Other | Attending: Internal Medicine

## 2021-11-30 ENCOUNTER — Inpatient Hospital Stay: Payer: Medicare Other

## 2021-11-30 DIAGNOSIS — Z79899 Other long term (current) drug therapy: Secondary | ICD-10-CM | POA: Diagnosis not present

## 2021-11-30 DIAGNOSIS — Z87442 Personal history of urinary calculi: Secondary | ICD-10-CM | POA: Diagnosis not present

## 2021-11-30 DIAGNOSIS — Z8719 Personal history of other diseases of the digestive system: Secondary | ICD-10-CM | POA: Diagnosis not present

## 2021-11-30 DIAGNOSIS — T380X5A Adverse effect of glucocorticoids and synthetic analogues, initial encounter: Secondary | ICD-10-CM | POA: Insufficient documentation

## 2021-11-30 DIAGNOSIS — W2203XA Walked into furniture, initial encounter: Secondary | ICD-10-CM | POA: Insufficient documentation

## 2021-11-30 DIAGNOSIS — D702 Other drug-induced agranulocytosis: Secondary | ICD-10-CM

## 2021-11-30 DIAGNOSIS — Z800 Family history of malignant neoplasm of digestive organs: Secondary | ICD-10-CM | POA: Insufficient documentation

## 2021-11-30 DIAGNOSIS — M3589 Other specified systemic involvement of connective tissue: Secondary | ICD-10-CM | POA: Insufficient documentation

## 2021-11-30 DIAGNOSIS — Z85828 Personal history of other malignant neoplasm of skin: Secondary | ICD-10-CM | POA: Insufficient documentation

## 2021-11-30 DIAGNOSIS — D692 Other nonthrombocytopenic purpura: Secondary | ICD-10-CM | POA: Insufficient documentation

## 2021-11-30 DIAGNOSIS — D708 Other neutropenia: Secondary | ICD-10-CM | POA: Diagnosis not present

## 2021-11-30 DIAGNOSIS — D72819 Decreased white blood cell count, unspecified: Secondary | ICD-10-CM

## 2021-11-30 DIAGNOSIS — Z840 Family history of diseases of the skin and subcutaneous tissue: Secondary | ICD-10-CM | POA: Insufficient documentation

## 2021-11-30 DIAGNOSIS — Z9049 Acquired absence of other specified parts of digestive tract: Secondary | ICD-10-CM | POA: Diagnosis not present

## 2021-11-30 DIAGNOSIS — Z853 Personal history of malignant neoplasm of breast: Secondary | ICD-10-CM | POA: Insufficient documentation

## 2021-11-30 DIAGNOSIS — R55 Syncope and collapse: Secondary | ICD-10-CM | POA: Insufficient documentation

## 2021-11-30 DIAGNOSIS — E785 Hyperlipidemia, unspecified: Secondary | ICD-10-CM | POA: Diagnosis not present

## 2021-11-30 DIAGNOSIS — Z811 Family history of alcohol abuse and dependence: Secondary | ICD-10-CM | POA: Diagnosis not present

## 2021-11-30 DIAGNOSIS — Z885 Allergy status to narcotic agent status: Secondary | ICD-10-CM | POA: Diagnosis not present

## 2021-11-30 DIAGNOSIS — Z9013 Acquired absence of bilateral breasts and nipples: Secondary | ICD-10-CM | POA: Insufficient documentation

## 2021-11-30 LAB — CBC WITH DIFFERENTIAL/PLATELET
Abs Immature Granulocytes: 0.24 10*3/uL — ABNORMAL HIGH (ref 0.00–0.07)
Basophils Absolute: 0 10*3/uL (ref 0.0–0.1)
Basophils Relative: 1 %
Eosinophils Absolute: 0.1 10*3/uL (ref 0.0–0.5)
Eosinophils Relative: 4 %
HCT: 45.9 % (ref 36.0–46.0)
Hemoglobin: 14.3 g/dL (ref 12.0–15.0)
Immature Granulocytes: 10 %
Lymphocytes Relative: 46 %
Lymphs Abs: 1.2 10*3/uL (ref 0.7–4.0)
MCH: 28.4 pg (ref 26.0–34.0)
MCHC: 31.2 g/dL (ref 30.0–36.0)
MCV: 91.1 fL (ref 80.0–100.0)
Monocytes Absolute: 0.3 10*3/uL (ref 0.1–1.0)
Monocytes Relative: 11 %
Neutro Abs: 0.7 10*3/uL — ABNORMAL LOW (ref 1.7–7.7)
Neutrophils Relative %: 28 %
Platelets: 227 10*3/uL (ref 150–400)
RBC: 5.04 MIL/uL (ref 3.87–5.11)
RDW: 15.7 % — ABNORMAL HIGH (ref 11.5–15.5)
Smear Review: NORMAL
WBC: 2.5 10*3/uL — ABNORMAL LOW (ref 4.0–10.5)
nRBC: 0 % (ref 0.0–0.2)

## 2021-11-30 MED ORDER — FILGRASTIM-SNDZ 480 MCG/0.8ML IJ SOSY
480.0000 ug | PREFILLED_SYRINGE | Freq: Once | INTRAMUSCULAR | Status: AC
  Administered 2021-11-30: 480 ug via SUBCUTANEOUS
  Filled 2021-11-30: qty 0.8

## 2021-12-06 ENCOUNTER — Inpatient Hospital Stay: Payer: Medicare Other

## 2021-12-06 DIAGNOSIS — D72819 Decreased white blood cell count, unspecified: Secondary | ICD-10-CM

## 2021-12-06 DIAGNOSIS — D702 Other drug-induced agranulocytosis: Secondary | ICD-10-CM

## 2021-12-06 DIAGNOSIS — D708 Other neutropenia: Secondary | ICD-10-CM

## 2021-12-06 LAB — CBC WITH DIFFERENTIAL/PLATELET
Abs Immature Granulocytes: 0.02 10*3/uL (ref 0.00–0.07)
Basophils Absolute: 0 10*3/uL (ref 0.0–0.1)
Basophils Relative: 1 %
Eosinophils Absolute: 0.1 10*3/uL (ref 0.0–0.5)
Eosinophils Relative: 4 %
HCT: 45.8 % (ref 36.0–46.0)
Hemoglobin: 14.3 g/dL (ref 12.0–15.0)
Immature Granulocytes: 1 %
Lymphocytes Relative: 47 %
Lymphs Abs: 1.3 10*3/uL (ref 0.7–4.0)
MCH: 28.1 pg (ref 26.0–34.0)
MCHC: 31.2 g/dL (ref 30.0–36.0)
MCV: 90.2 fL (ref 80.0–100.0)
Monocytes Absolute: 0.4 10*3/uL (ref 0.1–1.0)
Monocytes Relative: 13 %
Neutro Abs: 1 10*3/uL — ABNORMAL LOW (ref 1.7–7.7)
Neutrophils Relative %: 34 %
Platelets: 250 10*3/uL (ref 150–400)
RBC: 5.08 MIL/uL (ref 3.87–5.11)
RDW: 15.7 % — ABNORMAL HIGH (ref 11.5–15.5)
Smear Review: NORMAL
WBC: 2.8 10*3/uL — ABNORMAL LOW (ref 4.0–10.5)
nRBC: 0 % (ref 0.0–0.2)

## 2021-12-06 MED ORDER — FILGRASTIM-SNDZ 480 MCG/0.8ML IJ SOSY
480.0000 ug | PREFILLED_SYRINGE | Freq: Once | INTRAMUSCULAR | Status: AC
  Administered 2021-12-06: 480 ug via SUBCUTANEOUS
  Filled 2021-12-06: qty 0.8

## 2021-12-13 ENCOUNTER — Inpatient Hospital Stay: Payer: Medicare Other

## 2021-12-13 DIAGNOSIS — D708 Other neutropenia: Secondary | ICD-10-CM | POA: Diagnosis not present

## 2021-12-13 DIAGNOSIS — D72819 Decreased white blood cell count, unspecified: Secondary | ICD-10-CM

## 2021-12-13 DIAGNOSIS — D702 Other drug-induced agranulocytosis: Secondary | ICD-10-CM

## 2021-12-13 LAB — CBC WITH DIFFERENTIAL/PLATELET
Abs Immature Granulocytes: 0.03 10*3/uL (ref 0.00–0.07)
Basophils Absolute: 0 10*3/uL (ref 0.0–0.1)
Basophils Relative: 1 %
Eosinophils Absolute: 0.1 10*3/uL (ref 0.0–0.5)
Eosinophils Relative: 4 %
HCT: 46.5 % — ABNORMAL HIGH (ref 36.0–46.0)
Hemoglobin: 14.6 g/dL (ref 12.0–15.0)
Immature Granulocytes: 1 %
Lymphocytes Relative: 39 %
Lymphs Abs: 1.1 10*3/uL (ref 0.7–4.0)
MCH: 28.5 pg (ref 26.0–34.0)
MCHC: 31.4 g/dL (ref 30.0–36.0)
MCV: 90.6 fL (ref 80.0–100.0)
Monocytes Absolute: 0.3 10*3/uL (ref 0.1–1.0)
Monocytes Relative: 11 %
Neutro Abs: 1.2 10*3/uL — ABNORMAL LOW (ref 1.7–7.7)
Neutrophils Relative %: 44 %
Platelets: 249 10*3/uL (ref 150–400)
RBC: 5.13 MIL/uL — ABNORMAL HIGH (ref 3.87–5.11)
RDW: 15.6 % — ABNORMAL HIGH (ref 11.5–15.5)
Smear Review: NORMAL
WBC: 2.8 10*3/uL — ABNORMAL LOW (ref 4.0–10.5)
nRBC: 0 % (ref 0.0–0.2)

## 2021-12-13 MED ORDER — FILGRASTIM-SNDZ 480 MCG/0.8ML IJ SOSY
480.0000 ug | PREFILLED_SYRINGE | Freq: Once | INTRAMUSCULAR | Status: AC
  Administered 2021-12-13: 480 ug via SUBCUTANEOUS
  Filled 2021-12-13: qty 0.8

## 2021-12-20 ENCOUNTER — Inpatient Hospital Stay: Payer: Medicare Other

## 2021-12-20 ENCOUNTER — Inpatient Hospital Stay (HOSPITAL_BASED_OUTPATIENT_CLINIC_OR_DEPARTMENT_OTHER): Payer: Medicare Other | Admitting: Nurse Practitioner

## 2021-12-20 ENCOUNTER — Ambulatory Visit: Payer: Medicare Other | Admitting: Internal Medicine

## 2021-12-20 VITALS — BP 156/55 | HR 84 | Temp 98.9°F | Resp 16 | Wt 166.0 lb

## 2021-12-20 DIAGNOSIS — D708 Other neutropenia: Secondary | ICD-10-CM

## 2021-12-20 DIAGNOSIS — R55 Syncope and collapse: Secondary | ICD-10-CM

## 2021-12-20 DIAGNOSIS — D702 Other drug-induced agranulocytosis: Secondary | ICD-10-CM

## 2021-12-20 DIAGNOSIS — D72819 Decreased white blood cell count, unspecified: Secondary | ICD-10-CM

## 2021-12-20 LAB — CBC WITH DIFFERENTIAL/PLATELET
Abs Immature Granulocytes: 0.14 10*3/uL — ABNORMAL HIGH (ref 0.00–0.07)
Basophils Absolute: 0 10*3/uL (ref 0.0–0.1)
Basophils Relative: 1 %
Eosinophils Absolute: 0.1 10*3/uL (ref 0.0–0.5)
Eosinophils Relative: 3 %
HCT: 47.7 % — ABNORMAL HIGH (ref 36.0–46.0)
Hemoglobin: 14.7 g/dL (ref 12.0–15.0)
Immature Granulocytes: 6 %
Lymphocytes Relative: 34 %
Lymphs Abs: 0.9 10*3/uL (ref 0.7–4.0)
MCH: 27.8 pg (ref 26.0–34.0)
MCHC: 30.8 g/dL (ref 30.0–36.0)
MCV: 90.2 fL (ref 80.0–100.0)
Monocytes Absolute: 0.3 10*3/uL (ref 0.1–1.0)
Monocytes Relative: 13 %
Neutro Abs: 1.1 10*3/uL — ABNORMAL LOW (ref 1.7–7.7)
Neutrophils Relative %: 43 %
Platelets: 240 10*3/uL (ref 150–400)
RBC: 5.29 MIL/uL — ABNORMAL HIGH (ref 3.87–5.11)
RDW: 15.9 % — ABNORMAL HIGH (ref 11.5–15.5)
Smear Review: NORMAL
WBC: 2.6 10*3/uL — ABNORMAL LOW (ref 4.0–10.5)
nRBC: 0 % (ref 0.0–0.2)

## 2021-12-20 LAB — BASIC METABOLIC PANEL
Anion gap: 9 (ref 5–15)
BUN: 14 mg/dL (ref 8–23)
CO2: 29 mmol/L (ref 22–32)
Calcium: 9.1 mg/dL (ref 8.9–10.3)
Chloride: 101 mmol/L (ref 98–111)
Creatinine, Ser: 0.93 mg/dL (ref 0.44–1.00)
GFR, Estimated: >60 mL/min (ref >60)
Glucose, Bld: 117 mg/dL — ABNORMAL HIGH (ref 70–99)
Potassium: 4 mmol/L (ref 3.5–5.1)
Sodium: 139 mmol/L (ref 135–145)

## 2021-12-20 LAB — VITAMIN B12: Vitamin B-12: 3479 pg/mL — ABNORMAL HIGH (ref 180–914)

## 2021-12-20 MED ORDER — FILGRASTIM-SNDZ 480 MCG/0.8ML IJ SOSY
480.0000 ug | PREFILLED_SYRINGE | Freq: Once | INTRAMUSCULAR | Status: AC
  Administered 2021-12-20: 480 ug via SUBCUTANEOUS
  Filled 2021-12-20: qty 0.8

## 2021-12-20 NOTE — Progress Notes (Signed)
Returns for follow-up. Reports that she has had 2 "black out" incidents since April. States that she will f/u with PCP about this.

## 2021-12-20 NOTE — Progress Notes (Signed)
[facility] NOTE  Patient Care Team: Baxter Hire, MD as PCP - General (Internal Medicine) Emmaline Kluver., MD (Rheumatology) Dasher, Rayvon Char, MD (Dermatology) Cammie Sickle, MD as Consulting Physician (Hematology and Oncology) Carloyn Manner, MD as Consulting Physician (Otolaryngology)  CHIEF COMPLAINTS/PURPOSE OF CONSULTATION: Autoimmune neutropenia  #April 2019- Autoimmune neutropenia-weekly Granix/CBC [Dr.Crocoran]; BMBx- no malignancy [Bone marrow aspirate and biopsy on 09/04/2017 revealed a hypercellular marrow for age with trilineage hematopoiesis.  There was abundant mature neutrophils.  Significant dyspoiesis or increased blasts was not identified.  Flow cytometry revealed a predominance of T lymphocytes (16% of all cells) with no abnormal phenotype.  There was a minor B-cell population (13% of lymphocytes) with slight kappa light chain excess.  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    HISTORY OF PRESENTING ILLNESS:  [name] 87 y.o.  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  Complains of 2 episodes of syncope. No dizziness or warning signs. Says she is performing activity and someone 'shuts off the lights'. Hit her knee on bed frame during first episode. Second episode happened at the cancer center while getting injection and she was caught by staff member. Has not seen anyone for this. No recent hospitalizations, infections, or needed antibiotics. Ongoing easy bruising.   Review of Systems   Constitutional:  Negative for chills, fever, malaise/fatigue and weight loss.  HENT:  Negative for hearing loss, nosebleeds, sore throat and tinnitus.   Eyes:  Negative for blurred vision and double vision.  Respiratory:  Negative for cough, hemoptysis, shortness of breath and wheezing.   Cardiovascular:  Negative for chest pain, palpitations and leg swelling.  Gastrointestinal:  Negative for abdominal pain, blood in stool, constipation, diarrhea, melena, nausea and vomiting.  Genitourinary:  Negative for dysuria and urgency.  Musculoskeletal:  Negative for back pain, falls, joint pain and myalgias.  Skin:  Negative for itching and rash.  Neurological:  Positive for loss of consciousness. Negative for dizziness, tingling, sensory change, weakness and headaches.  Endo/Heme/Allergies:  Negative for environmental allergies. Does not bruise/bleed easily.  Psychiatric/Behavioral:  Negative for depression. The patient is not nervous/anxious and does not have insomnia.      MEDICAL HISTORY:  Past Medical History:  Diagnosis Date   Breast cancer ([place]) 1985   bilateral mastectomies   Depression    1 time specific situation that she did not know how to deal wiht   Diverticulosis    Hiatal hernia    History of Bell's palsy    History of colon polyps    History of COVID-19    History of nephrolithiasis    History of pancreatitis    Hyperlipidemia    IBS (irritable bowel syndrome)    Osteoporosis    osteopenia in neck   Skin cancer    Temporal arteritis ([facility])     SURGICAL HISTORY: Past Surgical History:  Procedure Laterality Date   AUGMENTATION MAMMAPLASTY Bilateral 2000   COLON RESECTION SIGMOID N/A 10/13/2017   Procedure: COLON RESECTION SIGMOID;  Surgeon: Herbert Pun, MD;  Location: [facility] ORS;  Service: General;  Laterality:  N/A;   COLOSTOMY N/A 10/13/2017   Procedure: COLOSTOMY;  Surgeon: Herbert Pun, MD;  Location: [facility] ORS;  Service: General;  Laterality: N/A;    LAPAROSCOPIC CHOLECYSTECTOMY  2009   MASTECTOMY  1983   bilateral    SOCIAL HISTORY: Social History   Socioeconomic History   Marital status: Widowed    Spouse name: Not on file   Number of children: 2   Years of education: college   Highest education level: Not on file  Occupational History   Occupation: Engineer, production   Occupation: Retired  Tobacco Use   Smoking status: Former    Packs/day: 0.25    Years: 30.00    Total pack years: 7.50    Types: Cigarettes    Quit date: 05/17/2005    Years since quitting: 16.6   Smokeless tobacco: Never   Tobacco comments:    quit 11 years ago  Vaping Use   Vaping Use: Never used  Substance and Sexual Activity   Alcohol use: No    Alcohol/week: 0.0 standard drinks of alcohol   Drug use: No   Sexual activity: Not Currently  Other Topics Concern   Not on file  Social History Narrative   Patient lives at home alone.    Patient is retired.    Patient has some college.    Patient has 2 children.   Social Determinants of Health   Financial Resource Strain: Not on file  Food Insecurity: Not on file  Transportation Needs: Not on file  Physical Activity: Not on file  Stress: Not on file  Social Connections: Not on file  Intimate Partner Violence: Not on file    FAMILY HISTORY: Family History  Problem Relation Age of Onset   Colon cancer Mother 45   Scleroderma Father 25   Alcoholism Brother    Stomach cancer Neg Hx     ALLERGIES:  is allergic to morphine, benzalkonium chloride, escitalopram, hydralazine, neomycin-bacitracin zn-polymyx, and bacitracin-neomycin-polymyxin.  MEDICATIONS:  Current Outpatient Medications  Medication Sig Dispense Refill   alendronate (FOSAMAX) 70 MG tablet Take 70 mg by mouth once a week.     ALPRAZolam (XANAX) 0.25 MG tablet Take 0.25 mg by mouth 2 (two) times daily as needed.      amLODipine (NORVASC) 5 MG tablet Take 5 mg by mouth daily. Pt states that she she increased the tablet to 5 mg  daily. She was taking 2.5 mg. She stated that she thought the half tablet was not working efficiently. I asked the patient to inform her pcp that her dosage was increased.     Ascorbic Acid (VITAMIN C) 1000 MG tablet Take 1,000 mg by mouth daily.     aspirin EC 81 MG tablet Take 81 mg daily by mouth.     b complex vitamins capsule Take 1 capsule by mouth daily.     Biotin 1000 MCG tablet Take 1,000 mcg by mouth daily.      Cholecalciferol (VITAMIN D3) 2000 units capsule Take 2,000 Units by mouth daily.     Cyanocobalamin (B-12 PO) Take 2,500 mcg by mouth daily.     filgrastim-sndz (ZARXIO) 480 MCG/0.8ML SOSY injection Inject 0.8 mLs (480 mcg total) into the skin once a week. Administer injection subcutaneously 3.2 mL 6   predniSONE (DELTASONE) 5 MG tablet Take 5 mg by mouth daily.     traMADol (ULTRAM) 50 MG tablet Take 50 mg by mouth daily.     ZINC OXIDE PO Take 1 tablet by mouth daily.  No current facility-administered medications for this visit.    PHYSICAL EXAMINATION: ECOG PERFORMANCE STATUS: 0 - Asymptomatic  Vitals:   12/20/21 1024  BP: (!) 156/55  Pulse: 84  Resp: 16  Temp: 98.9 F (37.2 C)  SpO2: 100%   Filed Weights   12/20/21 1024  Weight: 166 lb (75.3 kg)    Physical Exam Constitutional:      Appearance: She is not ill-appearing.  Eyes:     General: No scleral icterus.    Conjunctiva/sclera: Conjunctivae normal.  Cardiovascular:     Rate and Rhythm: Normal rate and regular rhythm.  Abdominal:     General: There is no distension.     Palpations: Abdomen is soft.     Tenderness: There is no abdominal tenderness. There is no guarding.     Comments: Colostomy. Parastomal hernia.   Musculoskeletal:        General: No deformity.     Right lower leg: No edema.     Left lower leg: No edema.  Lymphadenopathy:     Cervical: No cervical adenopathy.  Skin:    General: Skin is warm and dry.  Neurological:     Mental Status: She is alert and oriented to person,  place, and time. Mental status is at baseline.  Psychiatric:        Mood and Affect: Mood normal.        Behavior: Behavior normal.     LABORATORY DATA:  I have reviewed the data as listed Lab Results  Component Value Date   WBC 2.6 (L) 12/20/2021   HGB 14.7 12/20/2021   HCT 47.7 (H) 12/20/2021   MCV 90.2 12/20/2021   PLT 240 12/20/2021    Recent Labs    05/10/21 0925 09/28/21 1008 12/20/21 1006  NA 138 138 139  K 3.6 3.6 4.0  CL 99 102 101  CO2 '29 28 29  '$ GLUCOSE 94 99 117*  BUN '15 20 14  '$ CREATININE 0.99 0.86 0.93  CALCIUM 9.1 8.8* 9.1  GFRNONAA 56* >60 >60   RADIOGRAPHIC STUDIES: I have personally reviewed the radiological images as listed and agreed with the findings in the report. No results found.   ASSESSMENT & PLAN:   Other neutropenia ([place]) # Autoimmune neutropenia- once a week- if ANC < 1.5 on- zarxio.  # Patient's WBC- today-2.6; ANC- 1.1. Continue zarxio today; and continue weekly zarxio.  Patient clinically asymptomatic.  Continue cbc/zarxioweekly/pt pref. STABLE; continue prednisone 5 mg/day [re- fill]; continue at current dose.   # Right LE infection s/p excision re: Skin cancer [Dr.Dasher]- STABLE.   # Elevated BP [at home ~130]- STABLE.    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 Question if syncope is related however, arteritis has been stable.   # Easy bruising - secondary to prednisone- senile purpura. STABLE.   # Syncope- x 2. Refer to cardiology.   # DISPOSITION: # proceed with ZARXIO today # weekly cbc/zaxio x 12 # 12 weeks- lab (cbc, bmp, b12), Dr. Rogue Bussing, +/- zarxio- la   No problem-specific Assessment & Plan notes found for this encounter.  All questions were answered. The patient knows to call the clinic with any problems, questions or concerns.    Verlon Au, NP 12/20/2021 11:52 AM

## 2021-12-27 ENCOUNTER — Inpatient Hospital Stay: Payer: Medicare Other

## 2021-12-27 ENCOUNTER — Inpatient Hospital Stay: Payer: Medicare Other | Attending: Internal Medicine

## 2021-12-27 DIAGNOSIS — Z79899 Other long term (current) drug therapy: Secondary | ICD-10-CM | POA: Diagnosis not present

## 2021-12-27 DIAGNOSIS — Z853 Personal history of malignant neoplasm of breast: Secondary | ICD-10-CM | POA: Diagnosis not present

## 2021-12-27 DIAGNOSIS — R609 Edema, unspecified: Secondary | ICD-10-CM | POA: Diagnosis not present

## 2021-12-27 DIAGNOSIS — Z8616 Personal history of COVID-19: Secondary | ICD-10-CM | POA: Diagnosis not present

## 2021-12-27 DIAGNOSIS — R531 Weakness: Secondary | ICD-10-CM | POA: Insufficient documentation

## 2021-12-27 DIAGNOSIS — D708 Other neutropenia: Secondary | ICD-10-CM | POA: Diagnosis not present

## 2021-12-27 DIAGNOSIS — Z85828 Personal history of other malignant neoplasm of skin: Secondary | ICD-10-CM | POA: Insufficient documentation

## 2021-12-27 DIAGNOSIS — K0889 Other specified disorders of teeth and supporting structures: Secondary | ICD-10-CM | POA: Diagnosis not present

## 2021-12-27 DIAGNOSIS — D72819 Decreased white blood cell count, unspecified: Secondary | ICD-10-CM | POA: Insufficient documentation

## 2021-12-27 DIAGNOSIS — Z8719 Personal history of other diseases of the digestive system: Secondary | ICD-10-CM | POA: Diagnosis not present

## 2021-12-27 DIAGNOSIS — Z885 Allergy status to narcotic agent status: Secondary | ICD-10-CM | POA: Insufficient documentation

## 2021-12-27 DIAGNOSIS — E785 Hyperlipidemia, unspecified: Secondary | ICD-10-CM | POA: Diagnosis not present

## 2021-12-27 DIAGNOSIS — Z9049 Acquired absence of other specified parts of digestive tract: Secondary | ICD-10-CM | POA: Diagnosis not present

## 2021-12-27 DIAGNOSIS — Z9013 Acquired absence of bilateral breasts and nipples: Secondary | ICD-10-CM | POA: Insufficient documentation

## 2021-12-27 DIAGNOSIS — D702 Other drug-induced agranulocytosis: Secondary | ICD-10-CM

## 2021-12-27 DIAGNOSIS — Z87442 Personal history of urinary calculi: Secondary | ICD-10-CM | POA: Insufficient documentation

## 2021-12-27 LAB — CBC WITH DIFFERENTIAL/PLATELET
Abs Immature Granulocytes: 0.18 10*3/uL — ABNORMAL HIGH (ref 0.00–0.07)
Basophils Absolute: 0 10*3/uL (ref 0.0–0.1)
Basophils Relative: 1 %
Eosinophils Absolute: 0.1 10*3/uL (ref 0.0–0.5)
Eosinophils Relative: 4 %
HCT: 44.3 % (ref 36.0–46.0)
Hemoglobin: 14 g/dL (ref 12.0–15.0)
Immature Granulocytes: 6 %
Lymphocytes Relative: 42 %
Lymphs Abs: 1.2 10*3/uL (ref 0.7–4.0)
MCH: 28.6 pg (ref 26.0–34.0)
MCHC: 31.6 g/dL (ref 30.0–36.0)
MCV: 90.4 fL (ref 80.0–100.0)
Monocytes Absolute: 0.4 10*3/uL (ref 0.1–1.0)
Monocytes Relative: 14 %
Neutro Abs: 0.9 10*3/uL — ABNORMAL LOW (ref 1.7–7.7)
Neutrophils Relative %: 33 %
Platelets: 231 10*3/uL (ref 150–400)
RBC: 4.9 MIL/uL (ref 3.87–5.11)
RDW: 16.1 % — ABNORMAL HIGH (ref 11.5–15.5)
Smear Review: NORMAL
WBC: 2.8 10*3/uL — ABNORMAL LOW (ref 4.0–10.5)
nRBC: 0 % (ref 0.0–0.2)

## 2021-12-27 MED ORDER — FILGRASTIM-SNDZ 480 MCG/0.8ML IJ SOSY
480.0000 ug | PREFILLED_SYRINGE | Freq: Once | INTRAMUSCULAR | Status: AC
  Administered 2021-12-27: 480 ug via SUBCUTANEOUS
  Filled 2021-12-27: qty 0.8

## 2022-01-02 DIAGNOSIS — E7800 Pure hypercholesterolemia, unspecified: Secondary | ICD-10-CM | POA: Diagnosis not present

## 2022-01-03 ENCOUNTER — Inpatient Hospital Stay: Payer: Medicare Other

## 2022-01-03 DIAGNOSIS — D708 Other neutropenia: Secondary | ICD-10-CM | POA: Diagnosis not present

## 2022-01-03 DIAGNOSIS — D72819 Decreased white blood cell count, unspecified: Secondary | ICD-10-CM

## 2022-01-03 DIAGNOSIS — D702 Other drug-induced agranulocytosis: Secondary | ICD-10-CM

## 2022-01-03 LAB — CBC WITH DIFFERENTIAL/PLATELET
Abs Immature Granulocytes: 0.04 10*3/uL (ref 0.00–0.07)
Basophils Absolute: 0 10*3/uL (ref 0.0–0.1)
Basophils Relative: 1 %
Eosinophils Absolute: 0.1 10*3/uL (ref 0.0–0.5)
Eosinophils Relative: 2 %
HCT: 45.6 % (ref 36.0–46.0)
Hemoglobin: 14.1 g/dL (ref 12.0–15.0)
Immature Granulocytes: 2 %
Lymphocytes Relative: 50 %
Lymphs Abs: 1.3 10*3/uL (ref 0.7–4.0)
MCH: 28.4 pg (ref 26.0–34.0)
MCHC: 30.9 g/dL (ref 30.0–36.0)
MCV: 91.8 fL (ref 80.0–100.0)
Monocytes Absolute: 0.3 10*3/uL (ref 0.1–1.0)
Monocytes Relative: 12 %
Neutro Abs: 0.9 10*3/uL — ABNORMAL LOW (ref 1.7–7.7)
Neutrophils Relative %: 33 %
Platelets: 219 10*3/uL (ref 150–400)
RBC: 4.97 MIL/uL (ref 3.87–5.11)
RDW: 16.2 % — ABNORMAL HIGH (ref 11.5–15.5)
Smear Review: NORMAL
WBC: 2.6 10*3/uL — ABNORMAL LOW (ref 4.0–10.5)
nRBC: 0 % (ref 0.0–0.2)

## 2022-01-03 MED ORDER — FILGRASTIM-SNDZ 480 MCG/0.8ML IJ SOSY
480.0000 ug | PREFILLED_SYRINGE | Freq: Once | INTRAMUSCULAR | Status: AC
  Administered 2022-01-03: 480 ug via SUBCUTANEOUS
  Filled 2022-01-03: qty 0.8

## 2022-01-10 ENCOUNTER — Inpatient Hospital Stay: Payer: Medicare Other

## 2022-01-10 DIAGNOSIS — D708 Other neutropenia: Secondary | ICD-10-CM | POA: Diagnosis not present

## 2022-01-10 DIAGNOSIS — D702 Other drug-induced agranulocytosis: Secondary | ICD-10-CM

## 2022-01-10 LAB — CBC WITH DIFFERENTIAL/PLATELET
Abs Immature Granulocytes: 0.13 10*3/uL — ABNORMAL HIGH (ref 0.00–0.07)
Basophils Absolute: 0 10*3/uL (ref 0.0–0.1)
Basophils Relative: 1 %
Eosinophils Absolute: 0 10*3/uL (ref 0.0–0.5)
Eosinophils Relative: 1 %
HCT: 46.6 % — ABNORMAL HIGH (ref 36.0–46.0)
Hemoglobin: 14.4 g/dL (ref 12.0–15.0)
Immature Granulocytes: 4 %
Lymphocytes Relative: 24 %
Lymphs Abs: 0.8 10*3/uL (ref 0.7–4.0)
MCH: 28.2 pg (ref 26.0–34.0)
MCHC: 30.9 g/dL (ref 30.0–36.0)
MCV: 91.4 fL (ref 80.0–100.0)
Monocytes Absolute: 0.4 10*3/uL (ref 0.1–1.0)
Monocytes Relative: 11 %
Neutro Abs: 1.9 10*3/uL (ref 1.7–7.7)
Neutrophils Relative %: 59 %
Platelets: 230 10*3/uL (ref 150–400)
RBC: 5.1 MIL/uL (ref 3.87–5.11)
RDW: 16.5 % — ABNORMAL HIGH (ref 11.5–15.5)
Smear Review: ADEQUATE
WBC: 3.2 10*3/uL — ABNORMAL LOW (ref 4.0–10.5)
nRBC: 0 % (ref 0.0–0.2)

## 2022-01-16 ENCOUNTER — Telehealth: Payer: Self-pay | Admitting: *Deleted

## 2022-01-16 DIAGNOSIS — D72819 Decreased white blood cell count, unspecified: Secondary | ICD-10-CM

## 2022-01-16 NOTE — Telephone Encounter (Signed)
MD agrees with [facility] eval.  Will also needs labs cbc/cmp.

## 2022-01-16 NOTE — Telephone Encounter (Signed)
Patient called reporting that she has an abscessed tooth and she needs Dr B to order antibiotics for her before she sees the dentist. Please advise

## 2022-01-17 ENCOUNTER — Inpatient Hospital Stay: Payer: Medicare Other

## 2022-01-17 ENCOUNTER — Encounter: Payer: Self-pay | Admitting: Medical Oncology

## 2022-01-17 ENCOUNTER — Inpatient Hospital Stay (HOSPITAL_BASED_OUTPATIENT_CLINIC_OR_DEPARTMENT_OTHER): Payer: Medicare Other | Admitting: Medical Oncology

## 2022-01-17 VITALS — BP 150/58 | HR 86 | Temp 98.5°F | Resp 16 | Wt 166.8 lb

## 2022-01-17 DIAGNOSIS — Z87442 Personal history of urinary calculi: Secondary | ICD-10-CM | POA: Diagnosis not present

## 2022-01-17 DIAGNOSIS — R609 Edema, unspecified: Secondary | ICD-10-CM | POA: Diagnosis not present

## 2022-01-17 DIAGNOSIS — D72819 Decreased white blood cell count, unspecified: Secondary | ICD-10-CM

## 2022-01-17 DIAGNOSIS — E785 Hyperlipidemia, unspecified: Secondary | ICD-10-CM | POA: Diagnosis not present

## 2022-01-17 DIAGNOSIS — Z79899 Other long term (current) drug therapy: Secondary | ICD-10-CM | POA: Diagnosis not present

## 2022-01-17 DIAGNOSIS — Z85828 Personal history of other malignant neoplasm of skin: Secondary | ICD-10-CM | POA: Diagnosis not present

## 2022-01-17 DIAGNOSIS — Z8616 Personal history of COVID-19: Secondary | ICD-10-CM | POA: Diagnosis not present

## 2022-01-17 DIAGNOSIS — D708 Other neutropenia: Secondary | ICD-10-CM

## 2022-01-17 DIAGNOSIS — K047 Periapical abscess without sinus: Secondary | ICD-10-CM

## 2022-01-17 DIAGNOSIS — R531 Weakness: Secondary | ICD-10-CM | POA: Diagnosis not present

## 2022-01-17 DIAGNOSIS — Z8719 Personal history of other diseases of the digestive system: Secondary | ICD-10-CM | POA: Diagnosis not present

## 2022-01-17 DIAGNOSIS — K0889 Other specified disorders of teeth and supporting structures: Secondary | ICD-10-CM | POA: Diagnosis not present

## 2022-01-17 DIAGNOSIS — Z853 Personal history of malignant neoplasm of breast: Secondary | ICD-10-CM | POA: Diagnosis not present

## 2022-01-17 DIAGNOSIS — Z9013 Acquired absence of bilateral breasts and nipples: Secondary | ICD-10-CM | POA: Diagnosis not present

## 2022-01-17 DIAGNOSIS — Z885 Allergy status to narcotic agent status: Secondary | ICD-10-CM | POA: Diagnosis not present

## 2022-01-17 DIAGNOSIS — Z9049 Acquired absence of other specified parts of digestive tract: Secondary | ICD-10-CM | POA: Diagnosis not present

## 2022-01-17 LAB — CBC WITH DIFFERENTIAL/PLATELET
Abs Immature Granulocytes: 0.01 10*3/uL (ref 0.00–0.07)
Basophils Absolute: 0 10*3/uL (ref 0.0–0.1)
Basophils Relative: 1 %
Eosinophils Absolute: 0 10*3/uL (ref 0.0–0.5)
Eosinophils Relative: 1 %
HCT: 44.3 % (ref 36.0–46.0)
Hemoglobin: 14.1 g/dL (ref 12.0–15.0)
Immature Granulocytes: 0 %
Lymphocytes Relative: 24 %
Lymphs Abs: 0.7 10*3/uL (ref 0.7–4.0)
MCH: 29 pg (ref 26.0–34.0)
MCHC: 31.8 g/dL (ref 30.0–36.0)
MCV: 91 fL (ref 80.0–100.0)
Monocytes Absolute: 0.3 10*3/uL (ref 0.1–1.0)
Monocytes Relative: 8 %
Neutro Abs: 2 10*3/uL (ref 1.7–7.7)
Neutrophils Relative %: 66 %
Platelets: 229 10*3/uL (ref 150–400)
RBC: 4.87 MIL/uL (ref 3.87–5.11)
RDW: 16 % — ABNORMAL HIGH (ref 11.5–15.5)
WBC: 3.1 10*3/uL — ABNORMAL LOW (ref 4.0–10.5)
nRBC: 0 % (ref 0.0–0.2)

## 2022-01-17 LAB — COMPREHENSIVE METABOLIC PANEL
ALT: 18 U/L (ref 0–44)
AST: 27 U/L (ref 15–41)
Albumin: 4.2 g/dL (ref 3.5–5.0)
Alkaline Phosphatase: 97 U/L (ref 38–126)
Anion gap: 8 (ref 5–15)
BUN: 23 mg/dL (ref 8–23)
CO2: 29 mmol/L (ref 22–32)
Calcium: 9 mg/dL (ref 8.9–10.3)
Chloride: 101 mmol/L (ref 98–111)
Creatinine, Ser: 1.04 mg/dL — ABNORMAL HIGH (ref 0.44–1.00)
GFR, Estimated: 53 mL/min — ABNORMAL LOW (ref >60)
Glucose, Bld: 117 mg/dL — ABNORMAL HIGH (ref 70–99)
Potassium: 4.4 mmol/L (ref 3.5–5.1)
Sodium: 138 mmol/L (ref 135–145)
Total Bilirubin: 1.1 mg/dL (ref 0.3–1.2)
Total Protein: 7.3 g/dL (ref 6.5–8.1)

## 2022-01-17 MED ORDER — AMOXICILLIN-POT CLAVULANATE 875-125 MG PO TABS: 1.0000 | ORAL_TABLET | Freq: Two times a day (BID) | ORAL | 0 refills | Status: AC

## 2022-01-17 NOTE — Progress Notes (Signed)
Symptom Management [place] at [facility] Telephone:([phone] [phone]) [phone]  Patient Care Team: Baxter Hire, MD as PCP - General (Internal Medicine) Emmaline Kluver., MD (Rheumatology) Dasher, Rayvon Char, MD (Dermatology) Cammie Sickle, MD as Consulting Physician (Hematology and Oncology) Carloyn Manner, MD as Consulting Physician (Otolaryngology)   Name of the patient: [name]  [id number]  1934/09/05   Date of visit: 01/17/22  Reason for Consult: [name] is a 87 y.o. female who presents today for:  Dental Pain: Patient reports that last Sunday night she started to have a toothache of her lower right molar. Has been off and on since. Saw her dentist who took x rays and suggested that she had a dental abscess, Referred her to an endodontist. She has appointment for root canal in 3 weeks but was told to get ABX first. No fevers, vomiting or chills. Has taken 1/2 a tramadol once or twice for pain with relief. No current pain.    PAST MEDICAL HISTORY: Past Medical History:  Diagnosis Date   Breast cancer ([place]) 1985   bilateral mastectomies   Depression    1 time specific situation that she did not know how to deal wiht   Diverticulosis    Hiatal hernia    History of Bell's palsy    History of colon polyps    History of COVID-19    History of nephrolithiasis    History of pancreatitis    Hyperlipidemia    IBS (irritable bowel syndrome)    Osteoporosis    osteopenia in neck   Skin cancer    Temporal arteritis ([place])     PAST SURGICAL HISTORY:  Past Surgical History:  Procedure Laterality Date   AUGMENTATION MAMMAPLASTY Bilateral 2000   COLON RESECTION SIGMOID N/A 10/13/2017   Procedure: COLON RESECTION SIGMOID;  Surgeon: Herbert Pun, MD;  Location: [facility] ORS;  Service: General;  Laterality: N/A;   COLOSTOMY N/A 10/13/2017   Procedure: COLOSTOMY;  Surgeon: Herbert Pun, MD;   Location: [facility] ORS;  Service: General;  Laterality: N/A;   LAPAROSCOPIC CHOLECYSTECTOMY  2009   MASTECTOMY  1983   bilateral    HEMATOLOGY/ONCOLOGY HISTORY:  Oncology History   No history exists.    ALLERGIES:  is allergic to morphine, benzalkonium chloride, escitalopram, hydralazine, neomycin-bacitracin zn-polymyx, and bacitracin-neomycin-polymyxin.  MEDICATIONS:  Current Outpatient Medications  Medication Sig Dispense Refill   alendronate (FOSAMAX) 70 MG tablet Take 70 mg by mouth once a week.     ALPRAZolam (XANAX) 0.25 MG tablet Take 0.25 mg by mouth 2 (two) times daily as needed.      amLODipine (NORVASC) 5 MG tablet Take 5 mg by mouth daily. Pt states that she she increased the tablet to 5 mg daily. She was taking 2.5 mg. She stated that she thought the half tablet was not working efficiently. I asked the patient to inform her pcp that her dosage was increased.     amoxicillin-clavulanate (AUGMENTIN) 875-125 MG tablet Take 1 tablet by mouth 2 (two) times daily for 10 days. 20 tablet 0   Ascorbic Acid (VITAMIN C) 1000 MG tablet Take 1,000 mg by mouth daily.     aspirin EC 81 MG tablet Take 81 mg daily by mouth.     b complex vitamins capsule Take 1 capsule by mouth daily.     Biotin 1000 MCG tablet Take 1,000 mcg by mouth daily.      Cholecalciferol (VITAMIN D3) 2000 units capsule  Take 2,000 Units by mouth daily.     Cyanocobalamin (B-12 PO) Take 2,500 mcg by mouth daily.     filgrastim-sndz (ZARXIO) 480 MCG/0.8ML SOSY injection Inject 0.8 mLs (480 mcg total) into the skin once a week. Administer injection subcutaneously 3.2 mL 6   predniSONE (DELTASONE) 5 MG tablet Take 5 mg by mouth daily.     traMADol (ULTRAM) 50 MG tablet Take 50 mg by mouth daily.     ZINC OXIDE PO Take 1 tablet by mouth daily.     No current facility-administered medications for this visit.    VITAL SIGNS: BP (!) 150/58 (BP Location: Left Arm, Patient Position: Sitting)   Pulse 86   Temp 98.5 F (36.9  C) (Tympanic)   Resp 16   Wt 166 lb 12.8 oz (75.7 kg)   SpO2 99%   BMI 27.76 kg/m  Filed Weights   01/17/22 1435  Weight: 166 lb 12.8 oz (75.7 kg)    Estimated body mass index is 27.76 kg/m as calculated from the following:   Height as of 09/28/21: '5\' 5"'$  (1.651 m).   Weight as of this encounter: 166 lb 12.8 oz (75.7 kg).  LABS: CBC:    Component Value Date/Time   WBC 3.1 (L) 01/17/2022 1133   HGB 14.1 01/17/2022 1133   HGB 12.9 08/12/2012 0430   HCT 44.3 01/17/2022 1133   HCT 38.5 08/12/2012 0430   PLT 229 01/17/2022 1133   PLT 237 08/12/2012 0430   MCV 91.0 01/17/2022 1133   MCV 94 08/12/2012 0430   NEUTROABS 2.0 01/17/2022 1133   NEUTROABS 3.1 08/12/2012 0430   LYMPHSABS 0.7 01/17/2022 1133   LYMPHSABS 1.7 08/12/2012 0430   MONOABS 0.3 01/17/2022 1133   MONOABS 0.5 08/12/2012 0430   EOSABS 0.0 01/17/2022 1133   EOSABS 0.1 08/12/2012 0430   BASOSABS 0.0 01/17/2022 1133   BASOSABS 0.0 08/12/2012 0430   Comprehensive Metabolic Panel:    Component Value Date/Time   NA 138 01/17/2022 1133   NA 140 08/12/2012 0430   K 4.4 01/17/2022 1133   K 3.9 08/12/2012 0430   CL 101 01/17/2022 1133   CL 107 08/12/2012 0430   CO2 29 01/17/2022 1133   CO2 28 08/12/2012 0430   BUN 23 01/17/2022 1133   BUN 11 08/12/2012 0430   CREATININE 1.04 (H) 01/17/2022 1133   CREATININE 0.78 08/12/2012 0430   GLUCOSE 117 (H) 01/17/2022 1133   GLUCOSE 80 08/12/2012 0430   CALCIUM 9.0 01/17/2022 1133   CALCIUM 7.9 (L) 08/12/2012 0430   AST 27 01/17/2022 1133   AST 26 08/11/2012 1117   ALT 18 01/17/2022 1133   ALT 20 08/11/2012 1117   ALKPHOS 97 01/17/2022 1133   ALKPHOS 109 08/11/2012 1117   BILITOT 1.1 01/17/2022 1133   BILITOT 0.7 08/11/2012 1117   PROT 7.3 01/17/2022 1133   PROT 7.4 08/11/2012 1117   ALBUMIN 4.2 01/17/2022 1133   ALBUMIN 3.7 08/11/2012 1117    RADIOGRAPHIC STUDIES: No results found.  PERFORMANCE STATUS (ECOG) : 1 - Symptomatic but completely  ambulatory  Review of Systems Unless otherwise noted, a complete review of systems is negative.  Physical Exam General: NAD HEENT: Mild edema and erythema of base of #32 with mild tenderness to palpation. No edema, tenderness of soft palate.  Lymph: No palpable lymphadenopathy of head/neck Cardiovascular: regular rate and rhythm Pulmonary: clear to auscultation bilaterally  Skin: no rashes Neurological: Weakness but otherwise nonfocal  Assessment and Plan- Patient is a  87 y.o. female    Encounter Diagnoses  Name Primary?   Dental abscess Yes   Autoimmune neutropenia ([place])    Leukopenia, unspecified type    New. Her chronic leukopenia and autoimmune neutropenia complicate clinical course. Given her Fosamax use it is important to treat aggressively with Augmentin to prevent AVN of bone. Discussed how to take this ABX along with common potential side effects and precautions. She will keep her visit with the endodontist. Discussed red flags. Follow up PRN.     Patient expressed understanding and was in agreement with this plan. She also understands that She can call clinic at any time with any questions, concerns, or complaints.   Thank you for allowing me to participate in the care of this very pleasant patient.   Time Total: 25  Visit consisted of counseling and education dealing with the complex and emotionally intense issues of symptom management in the setting of serious illness.Greater than 50%  of this time was spent counseling and coordinating care related to the above assessment and plan.  Signed by: Nelwyn Salisbury, PA-C

## 2022-01-17 NOTE — Progress Notes (Signed)
Pt reports having a tooth abscess on right lower side and needs antibiotics to be able to have root canal.

## 2022-01-19 DIAGNOSIS — R55 Syncope and collapse: Secondary | ICD-10-CM | POA: Diagnosis not present

## 2022-01-19 DIAGNOSIS — R0602 Shortness of breath: Secondary | ICD-10-CM | POA: Diagnosis not present

## 2022-01-19 DIAGNOSIS — E7800 Pure hypercholesterolemia, unspecified: Secondary | ICD-10-CM | POA: Diagnosis not present

## 2022-01-24 ENCOUNTER — Inpatient Hospital Stay: Payer: Medicare Other

## 2022-01-24 DIAGNOSIS — D708 Other neutropenia: Secondary | ICD-10-CM

## 2022-01-24 DIAGNOSIS — D72819 Decreased white blood cell count, unspecified: Secondary | ICD-10-CM

## 2022-01-24 DIAGNOSIS — D702 Other drug-induced agranulocytosis: Secondary | ICD-10-CM

## 2022-01-24 LAB — CBC WITH DIFFERENTIAL/PLATELET
Abs Immature Granulocytes: 0.2 10*3/uL — ABNORMAL HIGH (ref 0.00–0.07)
Basophils Absolute: 0 10*3/uL (ref 0.0–0.1)
Basophils Relative: 1 %
Eosinophils Absolute: 0.1 10*3/uL (ref 0.0–0.5)
Eosinophils Relative: 4 %
HCT: 44.8 % (ref 36.0–46.0)
Hemoglobin: 14 g/dL (ref 12.0–15.0)
Immature Granulocytes: 7 %
Lymphocytes Relative: 38 %
Lymphs Abs: 1.1 10*3/uL (ref 0.7–4.0)
MCH: 28.7 pg (ref 26.0–34.0)
MCHC: 31.3 g/dL (ref 30.0–36.0)
MCV: 91.8 fL (ref 80.0–100.0)
Monocytes Absolute: 0.3 10*3/uL (ref 0.1–1.0)
Monocytes Relative: 11 %
Neutro Abs: 1.1 10*3/uL — ABNORMAL LOW (ref 1.7–7.7)
Neutrophils Relative %: 39 %
Platelets: 218 10*3/uL (ref 150–400)
RBC: 4.88 MIL/uL (ref 3.87–5.11)
RDW: 16.2 % — ABNORMAL HIGH (ref 11.5–15.5)
WBC: 2.8 10*3/uL — ABNORMAL LOW (ref 4.0–10.5)
nRBC: 0 % (ref 0.0–0.2)

## 2022-01-24 MED ORDER — FILGRASTIM-SNDZ 480 MCG/0.8ML IJ SOSY
480.0000 ug | PREFILLED_SYRINGE | Freq: Once | INTRAMUSCULAR | Status: AC
  Administered 2022-01-24: 480 ug via SUBCUTANEOUS
  Filled 2022-01-24: qty 0.8

## 2022-01-31 ENCOUNTER — Inpatient Hospital Stay: Payer: Medicare Other

## 2022-01-31 ENCOUNTER — Inpatient Hospital Stay: Payer: Medicare Other | Attending: Internal Medicine

## 2022-01-31 DIAGNOSIS — D72819 Decreased white blood cell count, unspecified: Secondary | ICD-10-CM

## 2022-01-31 DIAGNOSIS — D708 Other neutropenia: Secondary | ICD-10-CM | POA: Insufficient documentation

## 2022-01-31 DIAGNOSIS — D702 Other drug-induced agranulocytosis: Secondary | ICD-10-CM

## 2022-01-31 DIAGNOSIS — Z79899 Other long term (current) drug therapy: Secondary | ICD-10-CM | POA: Insufficient documentation

## 2022-01-31 LAB — CBC WITH DIFFERENTIAL/PLATELET
Abs Immature Granulocytes: 0.07 10*3/uL (ref 0.00–0.07)
Basophils Absolute: 0 10*3/uL (ref 0.0–0.1)
Basophils Relative: 1 %
Eosinophils Absolute: 0.1 10*3/uL (ref 0.0–0.5)
Eosinophils Relative: 2 %
HCT: 45.9 % (ref 36.0–46.0)
Hemoglobin: 14.5 g/dL (ref 12.0–15.0)
Immature Granulocytes: 3 %
Lymphocytes Relative: 52 %
Lymphs Abs: 1.4 10*3/uL (ref 0.7–4.0)
MCH: 28.7 pg (ref 26.0–34.0)
MCHC: 31.6 g/dL (ref 30.0–36.0)
MCV: 90.9 fL (ref 80.0–100.0)
Monocytes Absolute: 0.2 10*3/uL (ref 0.1–1.0)
Monocytes Relative: 8 %
Neutro Abs: 0.9 10*3/uL — ABNORMAL LOW (ref 1.7–7.7)
Neutrophils Relative %: 34 %
Platelets: 197 10*3/uL (ref 150–400)
RBC: 5.05 MIL/uL (ref 3.87–5.11)
RDW: 16.2 % — ABNORMAL HIGH (ref 11.5–15.5)
Smear Review: NORMAL
WBC: 2.7 10*3/uL — ABNORMAL LOW (ref 4.0–10.5)
nRBC: 0 % (ref 0.0–0.2)

## 2022-01-31 MED ORDER — FILGRASTIM-SNDZ 480 MCG/0.8ML IJ SOSY
480.0000 ug | PREFILLED_SYRINGE | Freq: Once | INTRAMUSCULAR | Status: AC
  Administered 2022-01-31: 480 ug via SUBCUTANEOUS
  Filled 2022-01-31: qty 0.8

## 2022-02-07 ENCOUNTER — Inpatient Hospital Stay: Payer: Medicare Other

## 2022-02-07 DIAGNOSIS — D708 Other neutropenia: Secondary | ICD-10-CM | POA: Diagnosis not present

## 2022-02-07 DIAGNOSIS — D702 Other drug-induced agranulocytosis: Secondary | ICD-10-CM

## 2022-02-07 DIAGNOSIS — D72819 Decreased white blood cell count, unspecified: Secondary | ICD-10-CM

## 2022-02-07 LAB — CBC WITH DIFFERENTIAL/PLATELET
Abs Immature Granulocytes: 0 10*3/uL (ref 0.00–0.07)
Basophils Absolute: 0 10*3/uL (ref 0.0–0.1)
Basophils Relative: 1 %
Eosinophils Absolute: 0.1 10*3/uL (ref 0.0–0.5)
Eosinophils Relative: 2 %
HCT: 47.1 % — ABNORMAL HIGH (ref 36.0–46.0)
Hemoglobin: 14.9 g/dL (ref 12.0–15.0)
Immature Granulocytes: 0 %
Lymphocytes Relative: 49 %
Lymphs Abs: 1.4 10*3/uL (ref 0.7–4.0)
MCH: 28.8 pg (ref 26.0–34.0)
MCHC: 31.6 g/dL (ref 30.0–36.0)
MCV: 91.1 fL (ref 80.0–100.0)
Monocytes Absolute: 0.3 10*3/uL (ref 0.1–1.0)
Monocytes Relative: 11 %
Neutro Abs: 1.1 10*3/uL — ABNORMAL LOW (ref 1.7–7.7)
Neutrophils Relative %: 37 %
Platelets: 189 10*3/uL (ref 150–400)
RBC: 5.17 MIL/uL — ABNORMAL HIGH (ref 3.87–5.11)
RDW: 16.2 % — ABNORMAL HIGH (ref 11.5–15.5)
Smear Review: NORMAL
WBC: 2.8 10*3/uL — ABNORMAL LOW (ref 4.0–10.5)
nRBC: 0 % (ref 0.0–0.2)

## 2022-02-07 MED ORDER — FILGRASTIM-SNDZ 480 MCG/0.8ML IJ SOSY
480.0000 ug | PREFILLED_SYRINGE | Freq: Once | INTRAMUSCULAR | Status: AC
  Administered 2022-02-07: 480 ug via SUBCUTANEOUS
  Filled 2022-02-07: qty 0.8

## 2022-02-14 ENCOUNTER — Inpatient Hospital Stay: Payer: Medicare Other

## 2022-02-14 DIAGNOSIS — D708 Other neutropenia: Secondary | ICD-10-CM | POA: Diagnosis not present

## 2022-02-14 DIAGNOSIS — D702 Other drug-induced agranulocytosis: Secondary | ICD-10-CM

## 2022-02-14 DIAGNOSIS — D72819 Decreased white blood cell count, unspecified: Secondary | ICD-10-CM

## 2022-02-14 LAB — CBC WITH DIFFERENTIAL/PLATELET
Abs Immature Granulocytes: 0.16 10*3/uL — ABNORMAL HIGH (ref 0.00–0.07)
Basophils Absolute: 0 10*3/uL (ref 0.0–0.1)
Basophils Relative: 1 %
Eosinophils Absolute: 0 10*3/uL (ref 0.0–0.5)
Eosinophils Relative: 1 %
HCT: 45 % (ref 36.0–46.0)
Hemoglobin: 14 g/dL (ref 12.0–15.0)
Immature Granulocytes: 6 %
Lymphocytes Relative: 35 %
Lymphs Abs: 0.9 10*3/uL (ref 0.7–4.0)
MCH: 28.8 pg (ref 26.0–34.0)
MCHC: 31.1 g/dL (ref 30.0–36.0)
MCV: 92.6 fL (ref 80.0–100.0)
Monocytes Absolute: 0.2 10*3/uL (ref 0.1–1.0)
Monocytes Relative: 7 %
Neutro Abs: 1.3 10*3/uL — ABNORMAL LOW (ref 1.7–7.7)
Neutrophils Relative %: 50 %
Platelets: 232 10*3/uL (ref 150–400)
RBC: 4.86 MIL/uL (ref 3.87–5.11)
RDW: 16.3 % — ABNORMAL HIGH (ref 11.5–15.5)
Smear Review: ADEQUATE
WBC: 2.5 10*3/uL — ABNORMAL LOW (ref 4.0–10.5)
nRBC: 0 % (ref 0.0–0.2)

## 2022-02-14 MED ORDER — FILGRASTIM-SNDZ 480 MCG/0.8ML IJ SOSY
480.0000 ug | PREFILLED_SYRINGE | Freq: Once | INTRAMUSCULAR | Status: AC
  Administered 2022-02-14: 480 ug via SUBCUTANEOUS
  Filled 2022-02-14: qty 0.8

## 2022-02-17 DIAGNOSIS — R55 Syncope and collapse: Secondary | ICD-10-CM | POA: Diagnosis not present

## 2022-02-17 DIAGNOSIS — R0602 Shortness of breath: Secondary | ICD-10-CM | POA: Diagnosis not present

## 2022-02-21 ENCOUNTER — Inpatient Hospital Stay: Payer: Medicare Other

## 2022-02-21 DIAGNOSIS — D702 Other drug-induced agranulocytosis: Secondary | ICD-10-CM

## 2022-02-21 DIAGNOSIS — D708 Other neutropenia: Secondary | ICD-10-CM | POA: Diagnosis not present

## 2022-02-21 DIAGNOSIS — D72819 Decreased white blood cell count, unspecified: Secondary | ICD-10-CM

## 2022-02-21 LAB — CBC WITH DIFFERENTIAL/PLATELET
Abs Immature Granulocytes: 0.05 10*3/uL (ref 0.00–0.07)
Basophils Absolute: 0 10*3/uL (ref 0.0–0.1)
Basophils Relative: 1 %
Eosinophils Absolute: 0.1 10*3/uL (ref 0.0–0.5)
Eosinophils Relative: 2 %
HCT: 49.4 % — ABNORMAL HIGH (ref 36.0–46.0)
Hemoglobin: 15.4 g/dL — ABNORMAL HIGH (ref 12.0–15.0)
Immature Granulocytes: 2 %
Lymphocytes Relative: 55 %
Lymphs Abs: 1.5 10*3/uL (ref 0.7–4.0)
MCH: 28.5 pg (ref 26.0–34.0)
MCHC: 31.2 g/dL (ref 30.0–36.0)
MCV: 91.3 fL (ref 80.0–100.0)
Monocytes Absolute: 0.4 10*3/uL (ref 0.1–1.0)
Monocytes Relative: 13 %
Neutro Abs: 0.7 10*3/uL — ABNORMAL LOW (ref 1.7–7.7)
Neutrophils Relative %: 27 %
Platelets: 241 10*3/uL (ref 150–400)
RBC: 5.41 MIL/uL — ABNORMAL HIGH (ref 3.87–5.11)
RDW: 16 % — ABNORMAL HIGH (ref 11.5–15.5)
Smear Review: NORMAL
WBC: 2.7 10*3/uL — ABNORMAL LOW (ref 4.0–10.5)
nRBC: 0 % (ref 0.0–0.2)

## 2022-02-21 MED ORDER — FILGRASTIM-SNDZ 480 MCG/0.8ML IJ SOSY
480.0000 ug | PREFILLED_SYRINGE | Freq: Once | INTRAMUSCULAR | Status: AC
  Administered 2022-02-21: 480 ug via SUBCUTANEOUS
  Filled 2022-02-21: qty 0.8

## 2022-02-22 DIAGNOSIS — R55 Syncope and collapse: Secondary | ICD-10-CM | POA: Diagnosis not present

## 2022-02-22 DIAGNOSIS — I6523 Occlusion and stenosis of bilateral carotid arteries: Secondary | ICD-10-CM | POA: Diagnosis not present

## 2022-02-22 DIAGNOSIS — R0602 Shortness of breath: Secondary | ICD-10-CM | POA: Diagnosis not present

## 2022-02-28 ENCOUNTER — Inpatient Hospital Stay: Payer: Medicare Other

## 2022-02-28 ENCOUNTER — Inpatient Hospital Stay: Payer: Medicare Other | Attending: Internal Medicine

## 2022-02-28 DIAGNOSIS — Z811 Family history of alcohol abuse and dependence: Secondary | ICD-10-CM | POA: Insufficient documentation

## 2022-02-28 DIAGNOSIS — Z9049 Acquired absence of other specified parts of digestive tract: Secondary | ICD-10-CM | POA: Diagnosis not present

## 2022-02-28 DIAGNOSIS — D702 Other drug-induced agranulocytosis: Secondary | ICD-10-CM

## 2022-02-28 DIAGNOSIS — D708 Other neutropenia: Secondary | ICD-10-CM | POA: Diagnosis present

## 2022-02-28 DIAGNOSIS — Z853 Personal history of malignant neoplasm of breast: Secondary | ICD-10-CM | POA: Insufficient documentation

## 2022-02-28 DIAGNOSIS — M316 Other giant cell arteritis: Secondary | ICD-10-CM | POA: Diagnosis not present

## 2022-02-28 DIAGNOSIS — Z8719 Personal history of other diseases of the digestive system: Secondary | ICD-10-CM | POA: Diagnosis not present

## 2022-02-28 DIAGNOSIS — Z840 Family history of diseases of the skin and subcutaneous tissue: Secondary | ICD-10-CM | POA: Insufficient documentation

## 2022-02-28 DIAGNOSIS — Z85828 Personal history of other malignant neoplasm of skin: Secondary | ICD-10-CM | POA: Insufficient documentation

## 2022-02-28 DIAGNOSIS — T380X5A Adverse effect of glucocorticoids and synthetic analogues, initial encounter: Secondary | ICD-10-CM | POA: Diagnosis not present

## 2022-02-28 DIAGNOSIS — R030 Elevated blood-pressure reading, without diagnosis of hypertension: Secondary | ICD-10-CM | POA: Diagnosis not present

## 2022-02-28 DIAGNOSIS — D692 Other nonthrombocytopenic purpura: Secondary | ICD-10-CM | POA: Diagnosis not present

## 2022-02-28 DIAGNOSIS — Z9013 Acquired absence of bilateral breasts and nipples: Secondary | ICD-10-CM | POA: Insufficient documentation

## 2022-02-28 DIAGNOSIS — E785 Hyperlipidemia, unspecified: Secondary | ICD-10-CM | POA: Diagnosis not present

## 2022-02-28 DIAGNOSIS — Z79899 Other long term (current) drug therapy: Secondary | ICD-10-CM | POA: Insufficient documentation

## 2022-02-28 DIAGNOSIS — Z87442 Personal history of urinary calculi: Secondary | ICD-10-CM | POA: Insufficient documentation

## 2022-02-28 DIAGNOSIS — Z87891 Personal history of nicotine dependence: Secondary | ICD-10-CM | POA: Diagnosis not present

## 2022-02-28 DIAGNOSIS — Z800 Family history of malignant neoplasm of digestive organs: Secondary | ICD-10-CM | POA: Diagnosis not present

## 2022-02-28 DIAGNOSIS — Z885 Allergy status to narcotic agent status: Secondary | ICD-10-CM | POA: Insufficient documentation

## 2022-02-28 LAB — CBC WITH DIFFERENTIAL/PLATELET
Abs Immature Granulocytes: 0 10*3/uL (ref 0.00–0.07)
Basophils Absolute: 0 10*3/uL (ref 0.0–0.1)
Basophils Relative: 0 %
Eosinophils Absolute: 0 10*3/uL (ref 0.0–0.5)
Eosinophils Relative: 0 %
HCT: 46.4 % — ABNORMAL HIGH (ref 36.0–46.0)
Hemoglobin: 14.7 g/dL (ref 12.0–15.0)
Immature Granulocytes: 0 %
Lymphocytes Relative: 29 %
Lymphs Abs: 0.9 10*3/uL (ref 0.7–4.0)
MCH: 28.8 pg (ref 26.0–34.0)
MCHC: 31.7 g/dL (ref 30.0–36.0)
MCV: 91 fL (ref 80.0–100.0)
Monocytes Absolute: 0.2 10*3/uL (ref 0.1–1.0)
Monocytes Relative: 7 %
Neutro Abs: 1.9 10*3/uL (ref 1.7–7.7)
Neutrophils Relative %: 64 %
Platelets: 231 10*3/uL (ref 150–400)
RBC: 5.1 MIL/uL (ref 3.87–5.11)
RDW: 15.9 % — ABNORMAL HIGH (ref 11.5–15.5)
Smear Review: NORMAL
WBC: 3 10*3/uL — ABNORMAL LOW (ref 4.0–10.5)
nRBC: 0 % (ref 0.0–0.2)

## 2022-03-07 ENCOUNTER — Inpatient Hospital Stay: Payer: Medicare Other

## 2022-03-07 DIAGNOSIS — D702 Other drug-induced agranulocytosis: Secondary | ICD-10-CM

## 2022-03-07 DIAGNOSIS — D708 Other neutropenia: Secondary | ICD-10-CM | POA: Diagnosis not present

## 2022-03-07 LAB — CBC WITH DIFFERENTIAL/PLATELET
Abs Immature Granulocytes: 0.05 10*3/uL (ref 0.00–0.07)
Basophils Absolute: 0 10*3/uL (ref 0.0–0.1)
Basophils Relative: 1 %
Eosinophils Absolute: 0 10*3/uL (ref 0.0–0.5)
Eosinophils Relative: 0 %
HCT: 46.1 % — ABNORMAL HIGH (ref 36.0–46.0)
Hemoglobin: 14.6 g/dL (ref 12.0–15.0)
Immature Granulocytes: 2 %
Lymphocytes Relative: 21 %
Lymphs Abs: 0.7 10*3/uL (ref 0.7–4.0)
MCH: 28.9 pg (ref 26.0–34.0)
MCHC: 31.7 g/dL (ref 30.0–36.0)
MCV: 91.1 fL (ref 80.0–100.0)
Monocytes Absolute: 0.3 10*3/uL (ref 0.1–1.0)
Monocytes Relative: 8 %
Neutro Abs: 2.3 10*3/uL (ref 1.7–7.7)
Neutrophils Relative %: 68 %
Platelets: 221 10*3/uL (ref 150–400)
RBC: 5.06 MIL/uL (ref 3.87–5.11)
RDW: 15.9 % — ABNORMAL HIGH (ref 11.5–15.5)
WBC: 3.4 10*3/uL — ABNORMAL LOW (ref 4.0–10.5)
nRBC: 0 % (ref 0.0–0.2)

## 2022-03-13 ENCOUNTER — Other Ambulatory Visit: Payer: Self-pay | Admitting: *Deleted

## 2022-03-13 DIAGNOSIS — D708 Other neutropenia: Secondary | ICD-10-CM

## 2022-03-14 ENCOUNTER — Inpatient Hospital Stay: Payer: Medicare Other

## 2022-03-14 ENCOUNTER — Inpatient Hospital Stay (HOSPITAL_BASED_OUTPATIENT_CLINIC_OR_DEPARTMENT_OTHER): Payer: Medicare Other | Admitting: Internal Medicine

## 2022-03-14 ENCOUNTER — Encounter: Payer: Self-pay | Admitting: Internal Medicine

## 2022-03-14 DIAGNOSIS — Z9013 Acquired absence of bilateral breasts and nipples: Secondary | ICD-10-CM | POA: Diagnosis not present

## 2022-03-14 DIAGNOSIS — Z87891 Personal history of nicotine dependence: Secondary | ICD-10-CM | POA: Diagnosis not present

## 2022-03-14 DIAGNOSIS — T380X5A Adverse effect of glucocorticoids and synthetic analogues, initial encounter: Secondary | ICD-10-CM | POA: Diagnosis not present

## 2022-03-14 DIAGNOSIS — D708 Other neutropenia: Secondary | ICD-10-CM | POA: Diagnosis not present

## 2022-03-14 DIAGNOSIS — Z9049 Acquired absence of other specified parts of digestive tract: Secondary | ICD-10-CM | POA: Diagnosis not present

## 2022-03-14 DIAGNOSIS — Z800 Family history of malignant neoplasm of digestive organs: Secondary | ICD-10-CM | POA: Diagnosis not present

## 2022-03-14 DIAGNOSIS — R030 Elevated blood-pressure reading, without diagnosis of hypertension: Secondary | ICD-10-CM | POA: Diagnosis not present

## 2022-03-14 DIAGNOSIS — Z8719 Personal history of other diseases of the digestive system: Secondary | ICD-10-CM | POA: Diagnosis not present

## 2022-03-14 DIAGNOSIS — Z885 Allergy status to narcotic agent status: Secondary | ICD-10-CM | POA: Diagnosis not present

## 2022-03-14 DIAGNOSIS — D692 Other nonthrombocytopenic purpura: Secondary | ICD-10-CM | POA: Diagnosis not present

## 2022-03-14 DIAGNOSIS — E785 Hyperlipidemia, unspecified: Secondary | ICD-10-CM | POA: Diagnosis not present

## 2022-03-14 DIAGNOSIS — Z87442 Personal history of urinary calculi: Secondary | ICD-10-CM | POA: Diagnosis not present

## 2022-03-14 DIAGNOSIS — Z79899 Other long term (current) drug therapy: Secondary | ICD-10-CM | POA: Diagnosis not present

## 2022-03-14 DIAGNOSIS — Z853 Personal history of malignant neoplasm of breast: Secondary | ICD-10-CM | POA: Diagnosis not present

## 2022-03-14 DIAGNOSIS — M316 Other giant cell arteritis: Secondary | ICD-10-CM | POA: Diagnosis not present

## 2022-03-14 DIAGNOSIS — Z85828 Personal history of other malignant neoplasm of skin: Secondary | ICD-10-CM | POA: Diagnosis not present

## 2022-03-14 LAB — BASIC METABOLIC PANEL
Anion gap: 9 (ref 5–15)
BUN: 19 mg/dL (ref 8–23)
CO2: 28 mmol/L (ref 22–32)
Calcium: 9 mg/dL (ref 8.9–10.3)
Chloride: 102 mmol/L (ref 98–111)
Creatinine, Ser: 1.09 mg/dL — ABNORMAL HIGH (ref 0.44–1.00)
GFR, Estimated: 50 mL/min — ABNORMAL LOW (ref >60)
Glucose, Bld: 111 mg/dL — ABNORMAL HIGH (ref 70–99)
Potassium: 4.3 mmol/L (ref 3.5–5.1)
Sodium: 139 mmol/L (ref 135–145)

## 2022-03-14 LAB — VITAMIN B12: Vitamin B-12: 1336 pg/mL — ABNORMAL HIGH (ref 180–914)

## 2022-03-14 LAB — CBC WITH DIFFERENTIAL/PLATELET
Abs Immature Granulocytes: 0.05 10*3/uL (ref 0.00–0.07)
Basophils Absolute: 0 10*3/uL (ref 0.0–0.1)
Basophils Relative: 1 %
Eosinophils Absolute: 0 10*3/uL (ref 0.0–0.5)
Eosinophils Relative: 1 %
HCT: 45.5 % (ref 36.0–46.0)
Hemoglobin: 14.4 g/dL (ref 12.0–15.0)
Immature Granulocytes: 2 %
Lymphocytes Relative: 26 %
Lymphs Abs: 0.7 10*3/uL (ref 0.7–4.0)
MCH: 28.9 pg (ref 26.0–34.0)
MCHC: 31.6 g/dL (ref 30.0–36.0)
MCV: 91.4 fL (ref 80.0–100.0)
Monocytes Absolute: 0.2 10*3/uL (ref 0.1–1.0)
Monocytes Relative: 7 %
Neutro Abs: 1.7 10*3/uL (ref 1.7–7.7)
Neutrophils Relative %: 63 %
Platelets: 223 10*3/uL (ref 150–400)
RBC: 4.98 MIL/uL (ref 3.87–5.11)
RDW: 15.9 % — ABNORMAL HIGH (ref 11.5–15.5)
WBC: 2.7 10*3/uL — ABNORMAL LOW (ref 4.0–10.5)
nRBC: 0 % (ref 0.0–0.2)

## 2022-03-14 NOTE — Progress Notes (Unsigned)
[facility] NOTE  Patient Care Team: Baxter Hire, MD as PCP - General (Internal Medicine) Emmaline Kluver., MD (Rheumatology) Dasher, Rayvon Char, MD (Dermatology) Cammie Sickle, MD as Consulting Physician (Hematology and Oncology) Carloyn Manner, MD as Consulting Physician (Otolaryngology)  CHIEF COMPLAINTS/PURPOSE OF CONSULTATION: Autoimmune neutropenia  #April 2019- Autoimmune neutropenia-weekly Granix/CBC [Dr.Crocoran]; BMBx- no malignancy [Bone marrow aspirate and biopsy on 09/04/2017 revealed a hypercellular marrow for age with trilineage hematopoiesis.  There was abundant mature neutrophils.  Significant dyspoiesis or increased blasts was not identified.  Flow cytometry revealed a predominance of T lymphocytes (16% of all cells) with no abnormal phenotype.  There was a minor B-cell population (13% of lymphocytes) with slight kappa light chain excess.  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    HISTORY OF PRESENTING ILLNESS: Alone.  Ambulating independently.  [name] 87 y.o.  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  Patient wants to know if there is a dermatologist that can help with her skin. Patient has not needed any hospitalizations. Has not needed any antibiotics.  Denies any recent infections.  Patient continues to complain of easy bruising.    Review of Systems  Constitutional:  Negative for chills, diaphoresis, fever, malaise/fatigue and weight loss.  HENT:  Negative for nosebleeds  and sore throat.   Eyes:  Negative for double vision.  Respiratory:  Negative for cough, hemoptysis, sputum production, shortness of breath and wheezing.   Cardiovascular:  Negative for chest pain, palpitations and orthopnea.  Gastrointestinal:  Negative for blood in stool, constipation, diarrhea, heartburn, melena, nausea and vomiting.  Genitourinary:  Negative for dysuria, frequency and urgency.  Musculoskeletal:  Negative for back pain and joint pain.  Skin:  Negative for itching.  Neurological:  Positive for tingling. Negative for dizziness, focal weakness, weakness and headaches.  Endo/Heme/Allergies:  Bruises/bleeds easily.  Psychiatric/Behavioral:  Negative for depression. The patient is nervous/anxious. The patient does not have insomnia.      MEDICAL HISTORY:  Past Medical History:  Diagnosis Date   Breast cancer ([place]) 1985   bilateral mastectomies   Depression    1 time specific situation that she did not know how to deal wiht   Diverticulosis    Hiatal hernia    History of Bell's palsy    History of colon polyps    History of COVID-19    History of nephrolithiasis    History of pancreatitis    Hyperlipidemia    IBS (irritable bowel syndrome)    Osteoporosis    osteopenia in neck   Skin cancer    Temporal arteritis ([place])     SURGICAL HISTORY: Past Surgical History:  Procedure Laterality Date   AUGMENTATION MAMMAPLASTY Bilateral 2000   COLON RESECTION SIGMOID N/A 10/13/2017   Procedure: COLON RESECTION SIGMOID;  Surgeon: Herbert Pun, MD;  Location: [facility] ORS;  Service: General;  Laterality: N/A;   COLOSTOMY N/A 10/13/2017   Procedure: COLOSTOMY;  Surgeon: Herbert Pun, MD;  Location: [facility] ORS;  Service: General;  Laterality: N/A;   LAPAROSCOPIC CHOLECYSTECTOMY  2009   MASTECTOMY  1983  bilateral    SOCIAL HISTORY: Social History   Socioeconomic History   Marital status: Widowed    Spouse name: Not on file   Number of children: 2   Years  of education: college   Highest education level: Not on file  Occupational History   Occupation: Engineer, production   Occupation: Retired  Tobacco Use   Smoking status: Former    Packs/day: 0.25    Years: 30.00    Total pack years: 7.50    Types: Cigarettes    Quit date: 05/17/2005    Years since quitting: 16.8   Smokeless tobacco: Never   Tobacco comments:    quit 11 years ago  Vaping Use   Vaping Use: Never used  Substance and Sexual Activity   Alcohol use: No    Alcohol/week: 0.0 standard drinks of alcohol   Drug use: No   Sexual activity: Not Currently  Other Topics Concern   Not on file  Social History Narrative   Patient lives at home alone.    Patient is retired.    Patient has some college.    Patient has 2 children.   Social Determinants of Health   Financial Resource Strain: Not on file  Food Insecurity: Not on file  Transportation Needs: Not on file  Physical Activity: Not on file  Stress: Not on file  Social Connections: Not on file  Intimate Partner Violence: Not on file    FAMILY HISTORY: Family History  Problem Relation Age of Onset   Colon cancer Mother 25   Scleroderma Father 34   Alcoholism Brother    Stomach cancer Neg Hx     ALLERGIES:  is allergic to morphine, benzalkonium chloride, escitalopram, hydralazine, neomycin-bacitracin zn-polymyx, and bacitracin-neomycin-polymyxin.  MEDICATIONS:  Current Outpatient Medications  Medication Sig Dispense Refill   ALPRAZolam (XANAX) 0.25 MG tablet Take 0.25 mg by mouth 2 (two) times daily as needed.      amLODipine (NORVASC) 5 MG tablet Take 5 mg by mouth daily. Pt states that she she increased the tablet to 5 mg daily. She was taking 2.5 mg. She stated that she thought the half tablet was not working efficiently. I asked the patient to inform her pcp that her dosage was increased.     Ascorbic Acid (VITAMIN C) 1000 MG tablet Take 1,000 mg by mouth daily.     aspirin EC 81 MG tablet Take 81 mg daily by  mouth.     b complex vitamins capsule Take 1 capsule by mouth daily.     Biotin 1000 MCG tablet Take 1,000 mcg by mouth daily.      Cholecalciferol (VITAMIN D3) 2000 units capsule Take 2,000 Units by mouth daily.     filgrastim-sndz (ZARXIO) 480 MCG/0.8ML SOSY injection Inject 0.8 mLs (480 mcg total) into the skin once a week. Administer injection subcutaneously 3.2 mL 6   predniSONE (DELTASONE) 5 MG tablet Take 5 mg by mouth daily.     traMADol (ULTRAM) 50 MG tablet Take 50 mg by mouth daily.     ZINC OXIDE PO Take 1 tablet by mouth daily.     alendronate (FOSAMAX) 70 MG tablet Take 70 mg by mouth once a week. (Patient not taking: Reported on 03/14/2022)     Cyanocobalamin (B-12 PO) Take 2,500 mcg by mouth daily. (Patient not taking: Reported on 03/14/2022)     No current facility-administered medications for this visit.    PHYSICAL EXAMINATION: ECOG PERFORMANCE STATUS: 0 - Asymptomatic  Vitals:  03/14/22 1049  BP: (!) 172/63  Pulse: 80  Resp: 20  Temp: 97.8 F (36.6 C)  SpO2: 100%   Filed Weights   03/14/22 1049  Weight: 168 lb 3.2 oz (76.3 kg)    Physical Exam Constitutional:      Comments: Alone.  HENT:     Head: Normocephalic and atraumatic.     Mouth/Throat:     Pharynx: No oropharyngeal exudate.  Eyes:     Pupils: Pupils are equal, round, and reactive to light.  Cardiovascular:     Rate and Rhythm: Normal rate and regular rhythm.  Pulmonary:     Effort: No respiratory distress.     Breath sounds: No wheezing.  Abdominal:     General: Bowel sounds are normal. There is no distension.     Palpations: Abdomen is soft. There is no mass.     Tenderness: There is no abdominal tenderness. There is no guarding or rebound.     Comments: Colostomy.  Parastomal hernia.  Musculoskeletal:        General: No tenderness. Normal range of motion.     Cervical back: Normal range of motion and neck supple.  Skin:    General: Skin is warm.     Comments: Hyperpigmented rash  noted bilateral shins.  Right arm bruising/thin skin.  Neurological:     Mental Status: She is alert and oriented to person, place, and time.  Psychiatric:        Mood and Affect: Affect normal.     Comments: Anxious.      LABORATORY DATA:  I have reviewed the data as listed Lab Results  Component Value Date   WBC 2.7 (L) 03/14/2022   HGB 14.4 03/14/2022   HCT 45.5 03/14/2022   MCV 91.4 03/14/2022   PLT 223 03/14/2022   Recent Labs    12/20/21 1006 01/17/22 1133 03/14/22 1023  NA 139 138 139  K 4.0 4.4 4.3  CL 101 101 102  CO2 '29 29 28  '$ GLUCOSE 117* 117* 111*  BUN '14 23 19  '$ CREATININE 0.93 1.04* 1.09*  CALCIUM 9.1 9.0 9.0  GFRNONAA >60 53* 50*  PROT  --  7.3  --   ALBUMIN  --  4.2  --   AST  --  27  --   ALT  --  18  --   ALKPHOS  --  97  --   BILITOT  --  1.1  --     RADIOGRAPHIC STUDIES: I have personally reviewed the radiological images as listed and agreed with the findings in the report. No results found.   ASSESSMENT & PLAN:   Autoimmune neutropenia ([place]) # Autoimmune neutropenia- once a week- if ANC < 1.5 on- zarxio.  # Patient's WBC- today-2.8; ANC- 1.7 HOLD zarxio today; and  continue weekly zarxio.  Patient clinically asymptomatic.  Continue cbc/zarxioweekly/pt pref. STABLE; continue prednisone 5 mg/day [re- fill]; continue at current dose.   # Right LE infection s/p excision re: Skin cancer [Dr.Dasher]- ? [facility] for second opinion.    # Elevated BP [at home ~130]- STABLE.   # Hx of temporal arteritis->20 years on low dose prednisone-STABLE.    # Easy burising Bil LE/Upper extremity: sec to prednisone/senile purpura. STABLE.   # Hx of B12 deficiency- July 2023- > 3400- recommend B12 PO three times week.   # DISPOSITION: # HOLD ZARXIO today # weekly cbc/zaxio x 12 # follow up with MD in 12  Weeks; - cbc/bmp/ b12 levels-zarxio-Dr.B  All questions were answered. The patient knows to call the clinic with any problems, questions or concerns.     Cammie Sickle, MD 03/15/2022 1:09 PM

## 2022-03-14 NOTE — Assessment & Plan Note (Signed)
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  # Patient's WBC- today-2.8; ANC- 1.7 HOLD zarxio today; and  continue weekly zarxio.  Patient clinically asymptomatic.  Continue cbc/zarxioweekly/pt pref. STABLE; continue prednisone 5 mg/day [re- fill]; continue at current dose.   # Right LE infection s/p excision re: Skin cancer [Dr.Dasher]- STABLE.    # Elevated BP [at home ~130]- STABLE.   # Hx of temporal arteritis->20 years on low dose prednisone-STABLE.    # Easy burising Bil LE/Upper extremity: sec to prednisone/senile purpura. STABLE.   # Hx of B12 deficiency- July 2023- > 3400- recommend B12 PO three times week.   # DISPOSITION: # HOLD ZARXIO today # weekly cbc/zaxio x 12 # follow up with MD in 12  Weeks; - cbc/bmp/ b12 levels-zarxio-Dr.B

## 2022-03-14 NOTE — Progress Notes (Unsigned)
Patient wants to know if she needs to start back taking her Vitamin b12? Patient stopped taking b12 once she started taking B complex vitamin.   Patient wants to know if there is a dermatologist that can help with her skin changing due the shots.

## 2022-03-15 ENCOUNTER — Encounter: Payer: Self-pay | Admitting: Internal Medicine

## 2022-03-20 DIAGNOSIS — Z85828 Personal history of other malignant neoplasm of skin: Secondary | ICD-10-CM | POA: Diagnosis not present

## 2022-03-20 DIAGNOSIS — D2272 Melanocytic nevi of left lower limb, including hip: Secondary | ICD-10-CM | POA: Diagnosis not present

## 2022-03-20 DIAGNOSIS — D2261 Melanocytic nevi of right upper limb, including shoulder: Secondary | ICD-10-CM | POA: Diagnosis not present

## 2022-03-20 DIAGNOSIS — D2271 Melanocytic nevi of right lower limb, including hip: Secondary | ICD-10-CM | POA: Diagnosis not present

## 2022-03-21 ENCOUNTER — Inpatient Hospital Stay: Payer: Medicare Other

## 2022-03-21 VITALS — Temp 96.9°F

## 2022-03-21 DIAGNOSIS — D708 Other neutropenia: Secondary | ICD-10-CM

## 2022-03-21 DIAGNOSIS — D72819 Decreased white blood cell count, unspecified: Secondary | ICD-10-CM

## 2022-03-21 DIAGNOSIS — D702 Other drug-induced agranulocytosis: Secondary | ICD-10-CM

## 2022-03-21 LAB — CBC WITH DIFFERENTIAL/PLATELET
Abs Immature Granulocytes: 0.07 10*3/uL (ref 0.00–0.07)
Basophils Absolute: 0 10*3/uL (ref 0.0–0.1)
Basophils Relative: 1 %
Eosinophils Absolute: 0 10*3/uL (ref 0.0–0.5)
Eosinophils Relative: 2 %
HCT: 47.4 % — ABNORMAL HIGH (ref 36.0–46.0)
Hemoglobin: 14.5 g/dL (ref 12.0–15.0)
Immature Granulocytes: 3 %
Lymphocytes Relative: 41 %
Lymphs Abs: 1.1 10*3/uL (ref 0.7–4.0)
MCH: 28.7 pg (ref 26.0–34.0)
MCHC: 30.6 g/dL (ref 30.0–36.0)
MCV: 93.7 fL (ref 80.0–100.0)
Monocytes Absolute: 0.2 10*3/uL (ref 0.1–1.0)
Monocytes Relative: 9 %
Neutro Abs: 1.2 10*3/uL — ABNORMAL LOW (ref 1.7–7.7)
Neutrophils Relative %: 44 %
Platelets: 202 10*3/uL (ref 150–400)
RBC: 5.06 MIL/uL (ref 3.87–5.11)
RDW: 15.6 % — ABNORMAL HIGH (ref 11.5–15.5)
WBC: 2.6 10*3/uL — ABNORMAL LOW (ref 4.0–10.5)
nRBC: 0 % (ref 0.0–0.2)

## 2022-03-21 MED ORDER — FILGRASTIM-SNDZ 480 MCG/0.8ML IJ SOSY
480.0000 ug | PREFILLED_SYRINGE | Freq: Once | INTRAMUSCULAR | Status: AC
  Administered 2022-03-21: 480 ug via SUBCUTANEOUS
  Filled 2022-03-21: qty 0.8

## 2022-03-22 DIAGNOSIS — E7800 Pure hypercholesterolemia, unspecified: Secondary | ICD-10-CM | POA: Diagnosis not present

## 2022-03-22 DIAGNOSIS — R0602 Shortness of breath: Secondary | ICD-10-CM | POA: Diagnosis not present

## 2022-03-27 ENCOUNTER — Other Ambulatory Visit: Payer: Self-pay | Admitting: *Deleted

## 2022-03-27 ENCOUNTER — Encounter (INDEPENDENT_AMBULATORY_CARE_PROVIDER_SITE_OTHER): Payer: Self-pay

## 2022-03-27 DIAGNOSIS — D708 Other neutropenia: Secondary | ICD-10-CM

## 2022-03-28 ENCOUNTER — Inpatient Hospital Stay: Payer: Medicare Other

## 2022-03-28 DIAGNOSIS — D708 Other neutropenia: Secondary | ICD-10-CM

## 2022-03-28 DIAGNOSIS — D72819 Decreased white blood cell count, unspecified: Secondary | ICD-10-CM

## 2022-03-28 LAB — CBC WITH DIFFERENTIAL/PLATELET
Abs Immature Granulocytes: 0.03 10*3/uL (ref 0.00–0.07)
Basophils Absolute: 0 10*3/uL (ref 0.0–0.1)
Basophils Relative: 1 %
Eosinophils Absolute: 0 10*3/uL (ref 0.0–0.5)
Eosinophils Relative: 1 %
HCT: 48 % — ABNORMAL HIGH (ref 36.0–46.0)
Hemoglobin: 15.2 g/dL — ABNORMAL HIGH (ref 12.0–15.0)
Immature Granulocytes: 1 %
Lymphocytes Relative: 43 %
Lymphs Abs: 1.2 10*3/uL (ref 0.7–4.0)
MCH: 29.2 pg (ref 26.0–34.0)
MCHC: 31.7 g/dL (ref 30.0–36.0)
MCV: 92.1 fL (ref 80.0–100.0)
Monocytes Absolute: 0.2 10*3/uL (ref 0.1–1.0)
Monocytes Relative: 8 %
Neutro Abs: 1.2 10*3/uL — ABNORMAL LOW (ref 1.7–7.7)
Neutrophils Relative %: 46 %
Platelets: 183 10*3/uL (ref 150–400)
RBC: 5.21 MIL/uL — ABNORMAL HIGH (ref 3.87–5.11)
RDW: 15.3 % (ref 11.5–15.5)
Smear Review: NORMAL
WBC: 2.7 10*3/uL — ABNORMAL LOW (ref 4.0–10.5)
nRBC: 0 % (ref 0.0–0.2)

## 2022-03-28 MED ORDER — FILGRASTIM-SNDZ 480 MCG/0.8ML IJ SOSY
480.0000 ug | PREFILLED_SYRINGE | Freq: Once | INTRAMUSCULAR | Status: AC
  Administered 2022-03-28: 480 ug via SUBCUTANEOUS
  Filled 2022-03-28: qty 0.8

## 2022-04-03 ENCOUNTER — Other Ambulatory Visit: Payer: Self-pay | Admitting: *Deleted

## 2022-04-03 ENCOUNTER — Ambulatory Visit (INDEPENDENT_AMBULATORY_CARE_PROVIDER_SITE_OTHER): Payer: Medicare Other | Admitting: Vascular Surgery

## 2022-04-03 ENCOUNTER — Encounter (INDEPENDENT_AMBULATORY_CARE_PROVIDER_SITE_OTHER): Payer: Medicare Other

## 2022-04-03 DIAGNOSIS — D708 Other neutropenia: Secondary | ICD-10-CM

## 2022-04-04 ENCOUNTER — Inpatient Hospital Stay: Payer: Medicare Other | Attending: Internal Medicine

## 2022-04-04 ENCOUNTER — Inpatient Hospital Stay: Payer: Medicare Other

## 2022-04-04 DIAGNOSIS — Z808 Family history of malignant neoplasm of other organs or systems: Secondary | ICD-10-CM | POA: Insufficient documentation

## 2022-04-04 DIAGNOSIS — Z800 Family history of malignant neoplasm of digestive organs: Secondary | ICD-10-CM | POA: Diagnosis not present

## 2022-04-04 DIAGNOSIS — D708 Other neutropenia: Secondary | ICD-10-CM | POA: Insufficient documentation

## 2022-04-04 DIAGNOSIS — Z79899 Other long term (current) drug therapy: Secondary | ICD-10-CM | POA: Diagnosis not present

## 2022-04-04 DIAGNOSIS — Z87891 Personal history of nicotine dependence: Secondary | ICD-10-CM | POA: Diagnosis not present

## 2022-04-04 DIAGNOSIS — Z811 Family history of alcohol abuse and dependence: Secondary | ICD-10-CM | POA: Diagnosis not present

## 2022-04-04 LAB — CBC WITH DIFFERENTIAL/PLATELET
Abs Immature Granulocytes: 0 10*3/uL (ref 0.00–0.07)
Basophils Absolute: 0 10*3/uL (ref 0.0–0.1)
Basophils Relative: 0 %
Eosinophils Absolute: 0 10*3/uL (ref 0.0–0.5)
Eosinophils Relative: 1 %
HCT: 48.1 % — ABNORMAL HIGH (ref 36.0–46.0)
Hemoglobin: 15.1 g/dL — ABNORMAL HIGH (ref 12.0–15.0)
Lymphocytes Relative: 33 %
Lymphs Abs: 1 10*3/uL (ref 0.7–4.0)
MCH: 28.9 pg (ref 26.0–34.0)
MCHC: 31.4 g/dL (ref 30.0–36.0)
MCV: 92 fL (ref 80.0–100.0)
Monocytes Absolute: 0.1 10*3/uL (ref 0.1–1.0)
Monocytes Relative: 4 %
Neutro Abs: 1.9 10*3/uL (ref 1.7–7.7)
Neutrophils Relative %: 62 %
Platelets: 224 10*3/uL (ref 150–400)
RBC: 5.23 MIL/uL — ABNORMAL HIGH (ref 3.87–5.11)
RDW: 15.2 % (ref 11.5–15.5)
Smear Review: NORMAL
WBC: 3.1 10*3/uL — ABNORMAL LOW (ref 4.0–10.5)
nRBC: 0 % (ref 0.0–0.2)

## 2022-04-05 ENCOUNTER — Other Ambulatory Visit (INDEPENDENT_AMBULATORY_CARE_PROVIDER_SITE_OTHER): Payer: Self-pay | Admitting: Vascular Surgery

## 2022-04-05 DIAGNOSIS — I739 Peripheral vascular disease, unspecified: Secondary | ICD-10-CM

## 2022-04-06 ENCOUNTER — Ambulatory Visit: Payer: Medicare Other | Admitting: Gastroenterology

## 2022-04-06 DIAGNOSIS — R1032 Left lower quadrant pain: Secondary | ICD-10-CM

## 2022-04-06 DIAGNOSIS — K5909 Other constipation: Secondary | ICD-10-CM | POA: Diagnosis not present

## 2022-04-06 NOTE — Progress Notes (Signed)
Jonathon Bellows MD, MRCP(U.K) [address]  [place]  [place], [place] [address]  Main: [phone]  Fax: [phone]   Primary Care Physician: Baxter Hire, MD  Primary Gastroenterologist:  Dr. Jonathon Bellows   Chief Complaint  Patient presents with   Abdominal Pain    HPI: [name] is a 87 y.o. female   Summary of history : Recently seen back in November 2022 for GI issues.  History of autoimmune neutropenia follows with hematology, hiatal hernia.  She has had abdominal pain in the past in the left lower quadrant multiple abscesses in the abdomen 3 years back after an episode of abdominal discomfort and was found to have autoimmune neutropenia and underwent surgery where part of the colon resected at initial visit all her GI symptoms had resolved CT scan of the abdomen showed no gross abnormality   Interval history   03/30/2021-04/06/2022  04/04/2022: Hemoglobin 15.1  She is here today to see me for left lower quadrant abdominal pain which first occurred over 10 days back at that point of time she had not had a good bowel movement for many days she developed left lower quadrant pain which resolved after having a good bowel movement.  Subsequently she has not had a bowel movement for the past 5 days and now complains of left lower quadrant discomfort denies any fevers denies any rectal bleeding.    Current Outpatient Medications  Medication Sig Dispense Refill   alendronate (FOSAMAX) 70 MG tablet Take 70 mg by mouth once a week. (Patient not taking: Reported on 03/14/2022)     ALPRAZolam (XANAX) 0.25 MG tablet Take 0.25 mg by mouth 2 (two) times daily as needed.      amLODipine (NORVASC) 5 MG tablet Take 5 mg by mouth daily. Pt states that she she increased the tablet to 5 mg daily. She was taking 2.5 mg. She stated that she thought the half tablet was not working efficiently. I asked the patient to inform her pcp that her dosage was increased.     Ascorbic Acid  (VITAMIN C) 1000 MG tablet Take 1,000 mg by mouth daily.     aspirin EC 81 MG tablet Take 81 mg daily by mouth.     b complex vitamins capsule Take 1 capsule by mouth daily.     Biotin 1000 MCG tablet Take 1,000 mcg by mouth daily.      Cholecalciferol (VITAMIN D3) 2000 units capsule Take 2,000 Units by mouth daily.     Cyanocobalamin (B-12 PO) Take 2,500 mcg by mouth daily. (Patient not taking: Reported on 03/14/2022)     filgrastim-sndz (ZARXIO) 480 MCG/0.8ML SOSY injection Inject 0.8 mLs (480 mcg total) into the skin once a week. Administer injection subcutaneously 3.2 mL 6   predniSONE (DELTASONE) 5 MG tablet Take 5 mg by mouth daily.     traMADol (ULTRAM) 50 MG tablet Take 50 mg by mouth daily.     ZINC OXIDE PO Take 1 tablet by mouth daily.     No current facility-administered medications for this visit.    Allergies as of 04/06/2022 - Review Complete 03/14/2022  Allergen Reaction Noted   Morphine Other (See Comments) 07/12/2018   Benzalkonium chloride Dermatitis 04/14/2014   Escitalopram  10/20/2014   Hydralazine  07/25/2018   Neomycin-bacitracin zn-polymyx Dermatitis 06/08/2008   Bacitracin-neomycin-polymyxin Dermatitis 06/08/2008    ROS:  General: Negative for anorexia, weight loss, fever, chills, fatigue, weakness. ENT: Negative for hoarseness, difficulty swallowing , nasal  congestion. CV: Negative for chest pain, angina, palpitations, dyspnea on exertion, peripheral edema.  Respiratory: Negative for dyspnea at rest, dyspnea on exertion, cough, sputum, wheezing.  GI: See history of present illness. GU:  Negative for dysuria, hematuria, urinary incontinence, urinary frequency, nocturnal urination.  Endo: Negative for unusual weight change.    Physical Examination:   There were no vitals taken for this visit.  General: Well-nourished, well-developed in no acute distress.  Eyes: No icterus. Conjunctivae pink. Mouth: Oropharyngeal mucosa moist and pink , no lesions  erythema or exudate. Abdomen: Bowel sounds are normal, nontender, nondistended, no hepatosplenomegaly or masses, no abdominal bruits or hernia , no rebound or guarding.   Extremities: No lower extremity edema. No clubbing or deformities. Neuro: Alert and oriented x 3.  Grossly intact. Skin: Warm and dry, no jaundice.   Psych: Alert and cooperative, normal mood and affect.   Imaging Studies: No results found.  Assessment and Plan:   [name] is a 87 y.o. y/o female here to me for left lower quadrant abdominal pain based on her history is very likely related to constipation I suggested she take a suppository or a enema to help have a good bowel movement along which she can take MiraLAX 1-3 times a day until she has adequate results and subsequently can decrease it to either once a day or once every other day titrated to her desired bowel frequency.  I explained to her that if the abdominal discomfort does not resolve despite having a good bowel movement to call my office. Also suggested a high-fiber diet which she says she is already trying to comply with  Dr Jonathon Bellows  MD,MRCP [facility]) Follow up in as needed left lower quadrant pain

## 2022-04-10 ENCOUNTER — Encounter (INDEPENDENT_AMBULATORY_CARE_PROVIDER_SITE_OTHER): Payer: Self-pay | Admitting: Vascular Surgery

## 2022-04-10 ENCOUNTER — Ambulatory Visit (INDEPENDENT_AMBULATORY_CARE_PROVIDER_SITE_OTHER): Payer: Medicare Other

## 2022-04-10 ENCOUNTER — Ambulatory Visit (INDEPENDENT_AMBULATORY_CARE_PROVIDER_SITE_OTHER): Payer: Medicare Other | Admitting: Vascular Surgery

## 2022-04-10 VITALS — BP 144/72 | HR 80 | Resp 16 | Wt 164.8 lb

## 2022-04-10 DIAGNOSIS — I739 Peripheral vascular disease, unspecified: Secondary | ICD-10-CM

## 2022-04-10 DIAGNOSIS — I70212 Atherosclerosis of native arteries of extremities with intermittent claudication, left leg: Secondary | ICD-10-CM

## 2022-04-10 DIAGNOSIS — I872 Venous insufficiency (chronic) (peripheral): Secondary | ICD-10-CM

## 2022-04-10 DIAGNOSIS — E785 Hyperlipidemia, unspecified: Secondary | ICD-10-CM

## 2022-04-10 DIAGNOSIS — M316 Other giant cell arteritis: Secondary | ICD-10-CM | POA: Diagnosis not present

## 2022-04-10 DIAGNOSIS — K219 Gastro-esophageal reflux disease without esophagitis: Secondary | ICD-10-CM

## 2022-04-10 NOTE — Progress Notes (Signed)
MRN : [id number]  [name] is a 87 y.o. (1934/10/13) female who presents with chief complaint of check circulation.  History of Present Illness:  The patient returns to the office for followup and review of the noninvasive studies.   There have been no interval changes in lower extremity symptoms. No interval shortening of the patient's claudication distance or development of rest pain symptoms. No new ulcers or wounds have occurred since the last visit.  There have been no significant changes to the patient's overall health care.  The patient denies amaurosis fugax or recent TIA symptoms. There are no documented recent neurological changes noted. There is no history of DVT, PE or superficial thrombophlebitis. The patient denies recent episodes of angina or shortness of breath.   ABI Rt=1.04 and Lt=1.08  (previous ABI's Rt=1.04 and Lt=0.71)   No outpatient medications have been marked as taking for the 04/10/22 encounter (Appointment) with Delana Meyer, Dolores Lory, MD.    Past Medical History:  Diagnosis Date   Breast cancer [facility]) 1985   bilateral mastectomies   Depression    1 time specific situation that she did not know how to deal wiht   Diverticulosis    Hiatal hernia    History of Bell's palsy    History of colon polyps    History of COVID-19    History of nephrolithiasis    History of pancreatitis    Hyperlipidemia    IBS (irritable bowel syndrome)    Osteoporosis    osteopenia in neck   Skin cancer    Temporal arteritis ([place])     Past Surgical History:  Procedure Laterality Date   AUGMENTATION MAMMAPLASTY Bilateral 2000   COLON RESECTION SIGMOID N/A 10/13/2017   Procedure: COLON RESECTION SIGMOID;  Surgeon: Herbert Pun, MD;  Location: [facility] ORS;  Service: General;  Laterality: N/A;   COLOSTOMY N/A 10/13/2017   Procedure: COLOSTOMY;  Surgeon: Herbert Pun, MD;  Location: [facility] ORS;  Service: General;  Laterality: N/A;    LAPAROSCOPIC CHOLECYSTECTOMY  2009   MASTECTOMY  1983   bilateral    Social History Social History   Tobacco Use   Smoking status: Former    Packs/day: 0.25    Years: 30.00    Total pack years: 7.50    Types: Cigarettes    Quit date: 05/17/2005    Years since quitting: 16.9   Smokeless tobacco: Never   Tobacco comments:    quit 11 years ago  Vaping Use   Vaping Use: Never used  Substance Use Topics   Alcohol use: No    Alcohol/week: 0.0 standard drinks of alcohol   Drug use: No    Family History Family History  Problem Relation Age of Onset   Colon cancer Mother 100   Scleroderma Father 45   Alcoholism Brother    Stomach cancer Neg Hx     Allergies  Allergen Reactions   Morphine Other (See Comments)    "done not work, makes me scream."   Benzalkonium Chloride Dermatitis    Other reaction(s): Unknown   Escitalopram     Deveioped a twitch   Hydralazine     Causes sx of heart pounding   Neomycin-Bacitracin Zn-Polymyx Dermatitis    Other reaction(s): Other (See Comments) REACTION: blisters skin REACTION: blisters skin REACTION: blisters skin   Bacitracin-Neomycin-Polymyxin Dermatitis    REACTION: blisters skin     REVIEW OF SYSTEMS (Negative  unless checked)  Constitutional: '[]'$ Weight loss  '[]'$ Fever  '[]'$ Chills Cardiac: '[]'$ Chest pain   '[]'$ Chest pressure   '[]'$ Palpitations   '[]'$ Shortness of breath when laying flat   '[]'$ Shortness of breath with exertion. Vascular:  '[x]'$ Pain in legs with walking   '[]'$ Pain in legs at rest  '[]'$ History of DVT   '[]'$ Phlebitis   '[]'$ Swelling in legs   '[]'$ Varicose veins   '[]'$ Non-healing ulcers Pulmonary:   '[]'$ Uses home oxygen   '[]'$ Productive cough   '[]'$ Hemoptysis   '[]'$ Wheeze  '[]'$ COPD   '[]'$ Asthma Neurologic:  '[]'$ Dizziness   '[]'$ Seizures   '[]'$ History of stroke   '[]'$ History of TIA  '[]'$ Aphasia   '[]'$ Vissual changes   '[]'$ Weakness or numbness in arm   '[]'$ Weakness or numbness in leg Musculoskeletal:   '[]'$ Joint swelling   '[]'$ Joint pain   '[]'$ Low back pain Hematologic:  '[]'$ Easy  bruising  '[]'$ Easy bleeding   '[]'$ Hypercoagulable state   '[]'$ Anemic Gastrointestinal:  '[]'$ Diarrhea   '[]'$ Vomiting  '[x]'$ Gastroesophageal reflux/heartburn   '[]'$ Difficulty swallowing. Genitourinary:  '[]'$ Chronic kidney disease   '[]'$ Difficult urination  '[]'$ Frequent urination   '[]'$ Blood in urine Skin:  '[]'$ Rashes   '[]'$ Ulcers  Psychological:  '[]'$ History of anxiety   '[]'$  History of major depression.  Physical Examination  There were no vitals filed for this visit. There is no height or weight on file to calculate BMI. Gen: WD/WN, NAD Head: [place]/AT, No temporalis wasting.  Ear/Nose/Throat: Hearing grossly intact, nares w/o erythema or drainage Eyes: PER, EOMI, sclera nonicteric.  Neck: Supple, no masses.  No bruit or JVD.  Pulmonary:  Good air movement, no audible wheezing, no use of accessory muscles.  Cardiac: RRR, normal S1, S2, no Murmurs. Vascular:  mild trophic changes, no open wounds Vessel Right Left  Radial Palpable Palpable  PT Not Palpable Not Palpable  DP Not Palpable Not Palpable  Gastrointestinal: soft, non-distended. No guarding/no peritoneal signs.  Musculoskeletal: M/S 5/5 throughout.  No visible deformity.  Neurologic: CN 2-12 intact. Pain and light touch intact in extremities.  Symmetrical.  Speech is fluent. Motor exam as listed above. Psychiatric: Judgment intact, Mood & affect appropriate for pt's clinical situation. Dermatologic: No rashes or ulcers noted.  No changes consistent with cellulitis.   CBC Lab Results  Component Value Date   WBC 3.1 (L) 04/04/2022   HGB 15.1 (H) 04/04/2022   HCT 48.1 (H) 04/04/2022   MCV 92.0 04/04/2022   PLT 224 04/04/2022    BMET    Component Value Date/Time   NA 139 03/14/2022 1023   NA 140 08/12/2012 0430   K 4.3 03/14/2022 1023   K 3.9 08/12/2012 0430   CL 102 03/14/2022 1023   CL 107 08/12/2012 0430   CO2 28 03/14/2022 1023   CO2 28 08/12/2012 0430   GLUCOSE 111 (H) 03/14/2022 1023   GLUCOSE 80 08/12/2012 0430   BUN 19 03/14/2022 1023    BUN 11 08/12/2012 0430   CREATININE 1.09 (H) 03/14/2022 1023   CREATININE 0.78 08/12/2012 0430   CALCIUM 9.0 03/14/2022 1023   CALCIUM 7.9 (L) 08/12/2012 0430   GFRNONAA 50 (L) 03/14/2022 1023   GFRNONAA >60 08/12/2012 0430   GFRAA 58 (L) 02/17/2020 0955   GFRAA >60 08/12/2012 0430   CrCl cannot be calculated (Patient's most recent lab result is older than the maximum 21 days allowed.).  COAG Lab Results  Component Value Date   INR 0.92 09/21/2017   INR 0.87 09/04/2017   INR 0.9 08/11/2012    Radiology No results found.   Assessment/Plan 1. Atherosclerosis of  native artery of left lower extremity with intermittent claudication (HCC)  Recommend:  The patient has evidence of atherosclerosis of the lower extremities with claudication.  The patient does not voice lifestyle limiting changes at this point in time.  Noninvasive studies do not suggest clinically significant change.  No invasive studies, angiography or surgery at this time The patient should continue walking and begin a more formal exercise program.  The patient should continue antiplatelet therapy and aggressive treatment of the lipid abnormalities  No changes in the patient's medications at this time  Continued surveillance is indicated as atherosclerosis is likely to progress with time.    The patient will continue follow up with noninvasive studies as ordered.  - VAS [place] ABI WITH/WO TBI; Future  2. Chronic venous insufficiency No surgery or intervention at this point in time.   The patient is CEAP [id number]   I have discussed with the patient venous insufficiency and why it  causes symptoms. I have discussed with the patient the chronic skin changes that accompany venous insufficiency and the long term sequela such as infection and ulceration.  Patient will begin wearing graduated compression stockings or compression wraps on a daily basis.  The patient will put the compression on first thing in the morning  and removing them in the evening. The patient is instructed specifically not to sleep in the compression.    In addition, behavioral modification including several periods of elevation of the lower extremities during the day will be continued. I have demonstrated that proper elevation is a position with the ankles at heart level.  The patient is instructed to begin routine exercise, especially walking on a daily basis  The patient will be assessed for a Lymph Pump depending on the effectiveness of conservative therapy and the control of the associated lymphedema.  3. Temporal arteritis ([place]) Plan per Rheumatology  4. Gastroesophageal reflux disease without esophagitis Continue PPI as already ordered, this medication has been reviewed and there are no changes at this time.  Avoidence of caffeine and alcohol  Moderate elevation of the head of the bed   5. Hyperlipidemia, unspecified hyperlipidemia type Continue statin as ordered and reviewed, no changes at this time  6. PVD (peripheral vascular disease) ([place]) Recommend:  I do not find evidence of life style limiting vascular disease. The patient specifically denies life style limitation.  Previous noninvasive studies including ABI's of the legs do not identify critical vascular problems.  The patient should continue walking and begin a more formal exercise program. The patient should continue his antiplatelet therapy and aggressive treatment of the lipid abnormalities.  The patient is instructed to call the office if there is a significant change in the lower extremity symptoms, particularly if a wound develops or there is an abrupt increase in leg pain.  - VAS [place] ABI WITH/WO TBI    Hortencia Pilar, MD  04/10/2022 12:58 PM

## 2022-04-11 ENCOUNTER — Inpatient Hospital Stay: Payer: Medicare Other

## 2022-04-11 DIAGNOSIS — Z808 Family history of malignant neoplasm of other organs or systems: Secondary | ICD-10-CM | POA: Diagnosis not present

## 2022-04-11 DIAGNOSIS — D708 Other neutropenia: Secondary | ICD-10-CM

## 2022-04-11 DIAGNOSIS — Z87891 Personal history of nicotine dependence: Secondary | ICD-10-CM | POA: Diagnosis not present

## 2022-04-11 DIAGNOSIS — Z811 Family history of alcohol abuse and dependence: Secondary | ICD-10-CM | POA: Diagnosis not present

## 2022-04-11 DIAGNOSIS — Z79899 Other long term (current) drug therapy: Secondary | ICD-10-CM | POA: Diagnosis not present

## 2022-04-11 DIAGNOSIS — D72819 Decreased white blood cell count, unspecified: Secondary | ICD-10-CM

## 2022-04-11 DIAGNOSIS — Z800 Family history of malignant neoplasm of digestive organs: Secondary | ICD-10-CM | POA: Diagnosis not present

## 2022-04-11 LAB — CBC WITH DIFFERENTIAL/PLATELET
Abs Immature Granulocytes: 0.02 10*3/uL (ref 0.00–0.07)
Basophils Absolute: 0 10*3/uL (ref 0.0–0.1)
Basophils Relative: 1 %
Eosinophils Absolute: 0 10*3/uL (ref 0.0–0.5)
Eosinophils Relative: 0 %
HCT: 46.7 % — ABNORMAL HIGH (ref 36.0–46.0)
Hemoglobin: 14.6 g/dL (ref 12.0–15.0)
Immature Granulocytes: 1 %
Lymphocytes Relative: 37 %
Lymphs Abs: 0.9 10*3/uL (ref 0.7–4.0)
MCH: 29.1 pg (ref 26.0–34.0)
MCHC: 31.3 g/dL (ref 30.0–36.0)
MCV: 93 fL (ref 80.0–100.0)
Monocytes Absolute: 0.3 10*3/uL (ref 0.1–1.0)
Monocytes Relative: 12 %
Neutro Abs: 1.2 10*3/uL — ABNORMAL LOW (ref 1.7–7.7)
Neutrophils Relative %: 49 %
Platelets: 262 10*3/uL (ref 150–400)
RBC: 5.02 MIL/uL (ref 3.87–5.11)
RDW: 14.9 % (ref 11.5–15.5)
WBC: 2.5 10*3/uL — ABNORMAL LOW (ref 4.0–10.5)
nRBC: 0 % (ref 0.0–0.2)

## 2022-04-11 MED ORDER — FILGRASTIM-SNDZ 480 MCG/0.8ML IJ SOSY
480.0000 ug | PREFILLED_SYRINGE | Freq: Once | INTRAMUSCULAR | Status: AC
  Administered 2022-04-11: 480 ug via SUBCUTANEOUS
  Filled 2022-04-11: qty 0.8

## 2022-04-16 ENCOUNTER — Encounter (INDEPENDENT_AMBULATORY_CARE_PROVIDER_SITE_OTHER): Payer: Self-pay | Admitting: Vascular Surgery

## 2022-04-18 ENCOUNTER — Inpatient Hospital Stay: Payer: Medicare Other

## 2022-04-18 DIAGNOSIS — D708 Other neutropenia: Secondary | ICD-10-CM | POA: Diagnosis not present

## 2022-04-18 LAB — CBC WITH DIFFERENTIAL/PLATELET
Abs Immature Granulocytes: 0.21 10*3/uL — ABNORMAL HIGH (ref 0.00–0.07)
Basophils Absolute: 0 10*3/uL (ref 0.0–0.1)
Basophils Relative: 1 %
Eosinophils Absolute: 0 10*3/uL (ref 0.0–0.5)
Eosinophils Relative: 1 %
HCT: 47.9 % — ABNORMAL HIGH (ref 36.0–46.0)
Hemoglobin: 15.3 g/dL — ABNORMAL HIGH (ref 12.0–15.0)
Immature Granulocytes: 6 %
Lymphocytes Relative: 35 %
Lymphs Abs: 1.2 10*3/uL (ref 0.7–4.0)
MCH: 29.3 pg (ref 26.0–34.0)
MCHC: 31.9 g/dL (ref 30.0–36.0)
MCV: 91.6 fL (ref 80.0–100.0)
Monocytes Absolute: 0.3 10*3/uL (ref 0.1–1.0)
Monocytes Relative: 9 %
Neutro Abs: 1.5 10*3/uL — ABNORMAL LOW (ref 1.7–7.7)
Neutrophils Relative %: 48 %
Platelets: 233 10*3/uL (ref 150–400)
RBC: 5.23 MIL/uL — ABNORMAL HIGH (ref 3.87–5.11)
RDW: 14.8 % (ref 11.5–15.5)
Smear Review: ADEQUATE
WBC: 3.3 10*3/uL — ABNORMAL LOW (ref 4.0–10.5)
nRBC: 0 % (ref 0.0–0.2)

## 2022-04-18 NOTE — Progress Notes (Signed)
ANC 1.5 today, no injection needed per MD parameters

## 2022-04-25 ENCOUNTER — Inpatient Hospital Stay: Payer: Medicare Other

## 2022-04-25 DIAGNOSIS — D708 Other neutropenia: Secondary | ICD-10-CM

## 2022-04-25 DIAGNOSIS — D72819 Decreased white blood cell count, unspecified: Secondary | ICD-10-CM

## 2022-04-25 LAB — CBC WITH DIFFERENTIAL/PLATELET
Abs Immature Granulocytes: 0.04 10*3/uL (ref 0.00–0.07)
Basophils Absolute: 0 10*3/uL (ref 0.0–0.1)
Basophils Relative: 1 %
Eosinophils Absolute: 0 10*3/uL (ref 0.0–0.5)
Eosinophils Relative: 1 %
HCT: 48.1 % — ABNORMAL HIGH (ref 36.0–46.0)
Hemoglobin: 15 g/dL (ref 12.0–15.0)
Immature Granulocytes: 2 %
Lymphocytes Relative: 52 %
Lymphs Abs: 1.3 10*3/uL (ref 0.7–4.0)
MCH: 28.9 pg (ref 26.0–34.0)
MCHC: 31.2 g/dL (ref 30.0–36.0)
MCV: 92.7 fL (ref 80.0–100.0)
Monocytes Absolute: 0.2 10*3/uL (ref 0.1–1.0)
Monocytes Relative: 8 %
Neutro Abs: 0.9 10*3/uL — ABNORMAL LOW (ref 1.7–7.7)
Neutrophils Relative %: 36 %
Platelets: 210 10*3/uL (ref 150–400)
RBC: 5.19 MIL/uL — ABNORMAL HIGH (ref 3.87–5.11)
RDW: 14.9 % (ref 11.5–15.5)
WBC: 2.4 10*3/uL — ABNORMAL LOW (ref 4.0–10.5)
nRBC: 0 % (ref 0.0–0.2)

## 2022-04-25 MED ORDER — FILGRASTIM-SNDZ 480 MCG/0.8ML IJ SOSY
480.0000 ug | PREFILLED_SYRINGE | Freq: Once | INTRAMUSCULAR | Status: AC
  Administered 2022-04-25: 480 ug via SUBCUTANEOUS
  Filled 2022-04-25: qty 0.8

## 2022-05-02 ENCOUNTER — Inpatient Hospital Stay: Payer: Medicare Other | Attending: Internal Medicine

## 2022-05-02 ENCOUNTER — Inpatient Hospital Stay: Payer: Medicare Other

## 2022-05-02 DIAGNOSIS — D708 Other neutropenia: Secondary | ICD-10-CM

## 2022-05-02 DIAGNOSIS — D72819 Decreased white blood cell count, unspecified: Secondary | ICD-10-CM

## 2022-05-02 LAB — CBC WITH DIFFERENTIAL/PLATELET
Abs Immature Granulocytes: 0.16 10*3/uL — ABNORMAL HIGH (ref 0.00–0.07)
Basophils Absolute: 0 10*3/uL (ref 0.0–0.1)
Basophils Relative: 1 %
Eosinophils Absolute: 0.1 10*3/uL (ref 0.0–0.5)
Eosinophils Relative: 2 %
HCT: 46.2 % — ABNORMAL HIGH (ref 36.0–46.0)
Hemoglobin: 14.3 g/dL (ref 12.0–15.0)
Immature Granulocytes: 6 %
Lymphocytes Relative: 49 %
Lymphs Abs: 1.4 10*3/uL (ref 0.7–4.0)
MCH: 28.8 pg (ref 26.0–34.0)
MCHC: 31 g/dL (ref 30.0–36.0)
MCV: 93 fL (ref 80.0–100.0)
Monocytes Absolute: 0.3 10*3/uL (ref 0.1–1.0)
Monocytes Relative: 10 %
Neutro Abs: 0.9 10*3/uL — ABNORMAL LOW (ref 1.7–7.7)
Neutrophils Relative %: 32 %
Platelets: 210 10*3/uL (ref 150–400)
RBC: 4.97 MIL/uL (ref 3.87–5.11)
RDW: 15 % (ref 11.5–15.5)
Smear Review: NORMAL
WBC: 2.8 10*3/uL — ABNORMAL LOW (ref 4.0–10.5)
nRBC: 0 % (ref 0.0–0.2)

## 2022-05-02 MED ORDER — FILGRASTIM-SNDZ 480 MCG/0.8ML IJ SOSY
480.0000 ug | PREFILLED_SYRINGE | Freq: Once | INTRAMUSCULAR | Status: AC
  Administered 2022-05-02: 480 ug via SUBCUTANEOUS
  Filled 2022-05-02: qty 0.8

## 2022-05-09 ENCOUNTER — Inpatient Hospital Stay: Payer: Medicare Other

## 2022-05-09 DIAGNOSIS — D72819 Decreased white blood cell count, unspecified: Secondary | ICD-10-CM

## 2022-05-09 DIAGNOSIS — D708 Other neutropenia: Secondary | ICD-10-CM

## 2022-05-09 LAB — CBC WITH DIFFERENTIAL/PLATELET
Abs Immature Granulocytes: 0.1 10*3/uL — ABNORMAL HIGH (ref 0.00–0.07)
Basophils Absolute: 0 10*3/uL (ref 0.0–0.1)
Basophils Relative: 1 %
Eosinophils Absolute: 0 10*3/uL (ref 0.0–0.5)
Eosinophils Relative: 1 %
HCT: 46.4 % — ABNORMAL HIGH (ref 36.0–46.0)
Hemoglobin: 14.8 g/dL (ref 12.0–15.0)
Immature Granulocytes: 3 %
Lymphocytes Relative: 26 %
Lymphs Abs: 0.8 10*3/uL (ref 0.7–4.0)
MCH: 29.1 pg (ref 26.0–34.0)
MCHC: 31.9 g/dL (ref 30.0–36.0)
MCV: 91.2 fL (ref 80.0–100.0)
Monocytes Absolute: 0.3 10*3/uL (ref 0.1–1.0)
Monocytes Relative: 9 %
Neutro Abs: 1.9 10*3/uL (ref 1.7–7.7)
Neutrophils Relative %: 60 %
Platelets: 224 10*3/uL (ref 150–400)
RBC: 5.09 MIL/uL (ref 3.87–5.11)
RDW: 14.8 % (ref 11.5–15.5)
Smear Review: ADEQUATE
WBC: 3.1 10*3/uL — ABNORMAL LOW (ref 4.0–10.5)
nRBC: 0 % (ref 0.0–0.2)

## 2022-05-09 MED ORDER — FILGRASTIM-SNDZ 480 MCG/0.8ML IJ SOSY
480.0000 ug | PREFILLED_SYRINGE | Freq: Once | INTRAMUSCULAR | Status: DC
  Filled 2022-05-09: qty 0.8

## 2022-05-09 NOTE — Progress Notes (Signed)
ANC 1.9 today. No injection needed

## 2022-05-16 ENCOUNTER — Inpatient Hospital Stay: Payer: Medicare Other

## 2022-05-16 DIAGNOSIS — D708 Other neutropenia: Secondary | ICD-10-CM

## 2022-05-16 LAB — CBC WITH DIFFERENTIAL/PLATELET
Abs Immature Granulocytes: 0.07 10*3/uL (ref 0.00–0.07)
Basophils Absolute: 0 10*3/uL (ref 0.0–0.1)
Basophils Relative: 1 %
Eosinophils Absolute: 0 10*3/uL (ref 0.0–0.5)
Eosinophils Relative: 1 %
HCT: 45.2 % (ref 36.0–46.0)
Hemoglobin: 14.3 g/dL (ref 12.0–15.0)
Immature Granulocytes: 2 %
Lymphocytes Relative: 25 %
Lymphs Abs: 0.9 10*3/uL (ref 0.7–4.0)
MCH: 29.1 pg (ref 26.0–34.0)
MCHC: 31.6 g/dL (ref 30.0–36.0)
MCV: 92.1 fL (ref 80.0–100.0)
Monocytes Absolute: 0.3 10*3/uL (ref 0.1–1.0)
Monocytes Relative: 8 %
Neutro Abs: 2.3 10*3/uL (ref 1.7–7.7)
Neutrophils Relative %: 63 %
Platelets: 234 10*3/uL (ref 150–400)
RBC: 4.91 MIL/uL (ref 3.87–5.11)
RDW: 14.9 % (ref 11.5–15.5)
WBC: 3.6 10*3/uL — ABNORMAL LOW (ref 4.0–10.5)
nRBC: 0 % (ref 0.0–0.2)

## 2022-05-16 NOTE — Progress Notes (Signed)
Patient not getting Zarxio today level is 2.3. Patient informed and verbalized understanding

## 2022-05-23 ENCOUNTER — Inpatient Hospital Stay: Payer: Medicare Other

## 2022-05-23 DIAGNOSIS — D708 Other neutropenia: Secondary | ICD-10-CM | POA: Diagnosis not present

## 2022-05-23 LAB — CBC WITH DIFFERENTIAL/PLATELET
Abs Immature Granulocytes: 0.07 10*3/uL (ref 0.00–0.07)
Basophils Absolute: 0 10*3/uL (ref 0.0–0.1)
Basophils Relative: 1 %
Eosinophils Absolute: 0 10*3/uL (ref 0.0–0.5)
Eosinophils Relative: 1 %
HCT: 43.8 % (ref 36.0–46.0)
Hemoglobin: 14.1 g/dL (ref 12.0–15.0)
Immature Granulocytes: 2 %
Lymphocytes Relative: 30 %
Lymphs Abs: 0.9 10*3/uL (ref 0.7–4.0)
MCH: 29.4 pg (ref 26.0–34.0)
MCHC: 32.2 g/dL (ref 30.0–36.0)
MCV: 91.3 fL (ref 80.0–100.0)
Monocytes Absolute: 0.3 10*3/uL (ref 0.1–1.0)
Monocytes Relative: 9 %
Neutro Abs: 1.7 10*3/uL (ref 1.7–7.7)
Neutrophils Relative %: 57 %
Platelets: 220 10*3/uL (ref 150–400)
RBC: 4.8 MIL/uL (ref 3.87–5.11)
RDW: 15.2 % (ref 11.5–15.5)
WBC: 3 10*3/uL — ABNORMAL LOW (ref 4.0–10.5)
nRBC: 0 % (ref 0.0–0.2)

## 2022-05-30 ENCOUNTER — Inpatient Hospital Stay: Payer: Medicare Other

## 2022-05-30 ENCOUNTER — Inpatient Hospital Stay: Payer: Medicare Other | Attending: Internal Medicine

## 2022-05-30 ENCOUNTER — Ambulatory Visit (INDEPENDENT_AMBULATORY_CARE_PROVIDER_SITE_OTHER): Payer: Medicare Other | Admitting: Gastroenterology

## 2022-05-30 ENCOUNTER — Encounter: Payer: Self-pay | Admitting: Gastroenterology

## 2022-05-30 VITALS — BP 177/81 | HR 91 | Temp 98.1°F | Wt 166.6 lb

## 2022-05-30 DIAGNOSIS — Z85828 Personal history of other malignant neoplasm of skin: Secondary | ICD-10-CM | POA: Insufficient documentation

## 2022-05-30 DIAGNOSIS — M316 Other giant cell arteritis: Secondary | ICD-10-CM | POA: Insufficient documentation

## 2022-05-30 DIAGNOSIS — Z808 Family history of malignant neoplasm of other organs or systems: Secondary | ICD-10-CM | POA: Diagnosis not present

## 2022-05-30 DIAGNOSIS — Z800 Family history of malignant neoplasm of digestive organs: Secondary | ICD-10-CM | POA: Diagnosis not present

## 2022-05-30 DIAGNOSIS — Z885 Allergy status to narcotic agent status: Secondary | ICD-10-CM | POA: Insufficient documentation

## 2022-05-30 DIAGNOSIS — Z9013 Acquired absence of bilateral breasts and nipples: Secondary | ICD-10-CM | POA: Insufficient documentation

## 2022-05-30 DIAGNOSIS — Z7952 Long term (current) use of systemic steroids: Secondary | ICD-10-CM | POA: Diagnosis not present

## 2022-05-30 DIAGNOSIS — Z811 Family history of alcohol abuse and dependence: Secondary | ICD-10-CM | POA: Diagnosis not present

## 2022-05-30 DIAGNOSIS — M359 Systemic involvement of connective tissue, unspecified: Secondary | ICD-10-CM | POA: Insufficient documentation

## 2022-05-30 DIAGNOSIS — E785 Hyperlipidemia, unspecified: Secondary | ICD-10-CM | POA: Insufficient documentation

## 2022-05-30 DIAGNOSIS — K5909 Other constipation: Secondary | ICD-10-CM | POA: Diagnosis not present

## 2022-05-30 DIAGNOSIS — Z9049 Acquired absence of other specified parts of digestive tract: Secondary | ICD-10-CM | POA: Diagnosis not present

## 2022-05-30 DIAGNOSIS — D692 Other nonthrombocytopenic purpura: Secondary | ICD-10-CM | POA: Insufficient documentation

## 2022-05-30 DIAGNOSIS — Z8719 Personal history of other diseases of the digestive system: Secondary | ICD-10-CM | POA: Insufficient documentation

## 2022-05-30 DIAGNOSIS — Z8616 Personal history of COVID-19: Secondary | ICD-10-CM | POA: Diagnosis not present

## 2022-05-30 DIAGNOSIS — D708 Other neutropenia: Secondary | ICD-10-CM | POA: Insufficient documentation

## 2022-05-30 DIAGNOSIS — Z853 Personal history of malignant neoplasm of breast: Secondary | ICD-10-CM | POA: Diagnosis not present

## 2022-05-30 DIAGNOSIS — Z79899 Other long term (current) drug therapy: Secondary | ICD-10-CM | POA: Diagnosis not present

## 2022-05-30 DIAGNOSIS — Z87442 Personal history of urinary calculi: Secondary | ICD-10-CM | POA: Insufficient documentation

## 2022-05-30 LAB — CBC WITH DIFFERENTIAL/PLATELET
Abs Immature Granulocytes: 0.08 10*3/uL — ABNORMAL HIGH (ref 0.00–0.07)
Basophils Absolute: 0 10*3/uL (ref 0.0–0.1)
Basophils Relative: 1 %
Eosinophils Absolute: 0 10*3/uL (ref 0.0–0.5)
Eosinophils Relative: 0 %
HCT: 44.6 % (ref 36.0–46.0)
Hemoglobin: 14.2 g/dL (ref 12.0–15.0)
Immature Granulocytes: 3 %
Lymphocytes Relative: 33 %
Lymphs Abs: 0.9 10*3/uL (ref 0.7–4.0)
MCH: 28.8 pg (ref 26.0–34.0)
MCHC: 31.8 g/dL (ref 30.0–36.0)
MCV: 90.5 fL (ref 80.0–100.0)
Monocytes Absolute: 0.2 10*3/uL (ref 0.1–1.0)
Monocytes Relative: 8 %
Neutro Abs: 1.5 10*3/uL — ABNORMAL LOW (ref 1.7–7.7)
Neutrophils Relative %: 55 %
Platelets: 217 10*3/uL (ref 150–400)
RBC: 4.93 MIL/uL (ref 3.87–5.11)
RDW: 14.9 % (ref 11.5–15.5)
WBC: 2.7 10*3/uL — ABNORMAL LOW (ref 4.0–10.5)
nRBC: 0 % (ref 0.0–0.2)

## 2022-05-30 NOTE — Progress Notes (Signed)
Jonathon Bellows MD, MRCP(U.K) [address]  [place]  [place], [place] [address]  Main: [phone]  Fax: [phone]   Primary Care Physician: Baxter Hire, MD  Primary Gastroenterologist:  Dr. Jonathon Bellows   Chief Complaint  Patient presents with   Abdominal Pain    HPI: [name] is a 88 y.o. female  Summary of history : History of autoimmune neutropenia follows with hematology, hiatal hernia.  She has had abdominal pain in the past in the left lower quadrant multiple abscesses in the abdomen 3 years back after an episode of abdominal discomfort and was found to have autoimmune neutropenia and underwent surgery where part of the colon resected at initial visit all her GI symptoms had resolved CT scan of the abdomen showed no gross abnormality In November 2023 she came back with left lower quadrant pain of 10 days duration after not having a good bowel movement that I felt was related to constipation.   Interval history  04/06/2022-05/30/2022   She states that she forgets to take the MiraLAX and it causes her to get constipated.  She would like her to have a medication that she could take easily she is going up to 5 days without a bowel movement.  No normal pain    Current Outpatient Medications  Medication Sig Dispense Refill   alendronate (FOSAMAX) 70 MG tablet Take 70 mg by mouth once a week.     ALPRAZolam (XANAX) 0.25 MG tablet Take 0.25 mg by mouth 2 (two) times daily as needed.      Ascorbic Acid (VITAMIN C) 1000 MG tablet Take 1,000 mg by mouth daily.     aspirin EC 81 MG tablet Take 81 mg daily by mouth.     b complex vitamins capsule Take 1 capsule by mouth daily.     Biotin 1000 MCG tablet Take 1,000 mcg by mouth daily.      Cholecalciferol (VITAMIN D3) 2000 units capsule Take 2,000 Units by mouth daily.     Cyanocobalamin (B-12 PO) Take 2,500 mcg by mouth daily.     filgrastim-sndz (ZARXIO) 480 MCG/0.8ML SOSY injection Inject 0.8 mLs (480 mcg  total) into the skin once a week. Administer injection subcutaneously 3.2 mL 6   predniSONE (DELTASONE) 5 MG tablet Take 5 mg by mouth daily.     traMADol (ULTRAM) 50 MG tablet Take 50 mg by mouth daily.     ZINC OXIDE PO Take 1 tablet by mouth daily.     amLODipine (NORVASC) 5 MG tablet Take 5 mg by mouth daily. Pt states that she she increased the tablet to 5 mg daily. She was taking 2.5 mg. She stated that she thought the half tablet was not working efficiently. I asked the patient to inform her pcp that her dosage was increased.     No current facility-administered medications for this visit.    Allergies as of 05/30/2022 - Review Complete 05/30/2022  Allergen Reaction Noted   Morphine Other (See Comments) 07/12/2018   Benzalkonium chloride Dermatitis 04/14/2014   Escitalopram  10/20/2014   Hydralazine  07/25/2018   Neomycin-bacitracin zn-polymyx Dermatitis 06/08/2008   Bacitracin-neomycin-polymyxin Dermatitis 06/08/2008    ROS:  General: Negative for anorexia, weight loss, fever, chills, fatigue, weakness. ENT: Negative for hoarseness, difficulty swallowing , nasal congestion. CV: Negative for chest pain, angina, palpitations, dyspnea on exertion, peripheral edema.  Respiratory: Negative for dyspnea at rest, dyspnea on exertion, cough, sputum, wheezing.  GI: See history of present  illness. GU:  Negative for dysuria, hematuria, urinary incontinence, urinary frequency, nocturnal urination.  Endo: Negative for unusual weight change.    Physical Examination:   BP (!) 177/81   Pulse 91   Temp 98.1 F (36.7 C) (Oral)   Wt 166 lb 9.6 oz (75.6 kg)   BMI 27.72 kg/m   General: Well-nourished, well-developed in no acute distress.  Eyes: No icterus. Conjunctivae pink. Neuro: Alert and oriented x 3.  Grossly intact. Skin: Warm and dry, no jaundice.   Psych: Alert and cooperative, normal mood and affect.   Imaging Studies: No results found.  Assessment and Plan:   [name] is a 88 y.o. y/o female here to follow-up for left lower quadrant abdominal pain likely due to constipation commenced on MiraLAX last visit finding it hard to take the quantity required on a regular basis would like something simple like a pill.  Started on Linzess 145 mcg a day.  10-day samples provided.  Advised if it works we will provide a prescription otherwise we will titrate the dose if either working too well or not working at all.  Down the road if symptoms are no better may need to consider sigmoidoscopy as a minimum    Dr Jonathon Bellows  MD,MRCP [facility]) Follow up in as needed

## 2022-06-02 ENCOUNTER — Encounter: Payer: Self-pay | Admitting: Internal Medicine

## 2022-06-05 ENCOUNTER — Other Ambulatory Visit: Payer: Self-pay

## 2022-06-05 DIAGNOSIS — D72819 Decreased white blood cell count, unspecified: Secondary | ICD-10-CM

## 2022-06-06 ENCOUNTER — Ambulatory Visit: Payer: Medicare Other | Admitting: Internal Medicine

## 2022-06-06 ENCOUNTER — Inpatient Hospital Stay: Payer: Medicare Other | Admitting: Internal Medicine

## 2022-06-06 ENCOUNTER — Inpatient Hospital Stay: Payer: Medicare Other

## 2022-06-06 ENCOUNTER — Ambulatory Visit: Payer: Medicare Other

## 2022-06-06 ENCOUNTER — Other Ambulatory Visit: Payer: Medicare Other

## 2022-06-09 ENCOUNTER — Inpatient Hospital Stay: Payer: Medicare Other

## 2022-06-09 ENCOUNTER — Encounter: Payer: Self-pay | Admitting: Internal Medicine

## 2022-06-09 ENCOUNTER — Inpatient Hospital Stay (HOSPITAL_BASED_OUTPATIENT_CLINIC_OR_DEPARTMENT_OTHER): Payer: Medicare Other | Admitting: Internal Medicine

## 2022-06-09 VITALS — BP 168/80 | HR 81 | Temp 97.0°F | Resp 18 | Wt 162.0 lb

## 2022-06-09 DIAGNOSIS — Z800 Family history of malignant neoplasm of digestive organs: Secondary | ICD-10-CM | POA: Diagnosis not present

## 2022-06-09 DIAGNOSIS — E785 Hyperlipidemia, unspecified: Secondary | ICD-10-CM | POA: Diagnosis not present

## 2022-06-09 DIAGNOSIS — Z7952 Long term (current) use of systemic steroids: Secondary | ICD-10-CM | POA: Diagnosis not present

## 2022-06-09 DIAGNOSIS — Z9013 Acquired absence of bilateral breasts and nipples: Secondary | ICD-10-CM | POA: Diagnosis not present

## 2022-06-09 DIAGNOSIS — Z87442 Personal history of urinary calculi: Secondary | ICD-10-CM | POA: Diagnosis not present

## 2022-06-09 DIAGNOSIS — Z85828 Personal history of other malignant neoplasm of skin: Secondary | ICD-10-CM | POA: Diagnosis not present

## 2022-06-09 DIAGNOSIS — D708 Other neutropenia: Secondary | ICD-10-CM

## 2022-06-09 DIAGNOSIS — D72819 Decreased white blood cell count, unspecified: Secondary | ICD-10-CM

## 2022-06-09 DIAGNOSIS — D692 Other nonthrombocytopenic purpura: Secondary | ICD-10-CM | POA: Diagnosis not present

## 2022-06-09 DIAGNOSIS — M359 Systemic involvement of connective tissue, unspecified: Secondary | ICD-10-CM | POA: Diagnosis not present

## 2022-06-09 DIAGNOSIS — M316 Other giant cell arteritis: Secondary | ICD-10-CM | POA: Diagnosis not present

## 2022-06-09 DIAGNOSIS — Z8616 Personal history of COVID-19: Secondary | ICD-10-CM | POA: Diagnosis not present

## 2022-06-09 DIAGNOSIS — Z79899 Other long term (current) drug therapy: Secondary | ICD-10-CM | POA: Diagnosis not present

## 2022-06-09 DIAGNOSIS — Z885 Allergy status to narcotic agent status: Secondary | ICD-10-CM | POA: Diagnosis not present

## 2022-06-09 DIAGNOSIS — Z853 Personal history of malignant neoplasm of breast: Secondary | ICD-10-CM | POA: Diagnosis not present

## 2022-06-09 DIAGNOSIS — Z8719 Personal history of other diseases of the digestive system: Secondary | ICD-10-CM | POA: Diagnosis not present

## 2022-06-09 DIAGNOSIS — Z9049 Acquired absence of other specified parts of digestive tract: Secondary | ICD-10-CM | POA: Diagnosis not present

## 2022-06-09 LAB — CBC WITH DIFFERENTIAL/PLATELET
Abs Immature Granulocytes: 0.03 10*3/uL (ref 0.00–0.07)
Basophils Absolute: 0 10*3/uL (ref 0.0–0.1)
Basophils Relative: 1 %
Eosinophils Absolute: 0.1 10*3/uL (ref 0.0–0.5)
Eosinophils Relative: 2 %
HCT: 45.2 % (ref 36.0–46.0)
Hemoglobin: 14.4 g/dL (ref 12.0–15.0)
Immature Granulocytes: 1 %
Lymphocytes Relative: 57 %
Lymphs Abs: 1.5 10*3/uL (ref 0.7–4.0)
MCH: 28.9 pg (ref 26.0–34.0)
MCHC: 31.9 g/dL (ref 30.0–36.0)
MCV: 90.8 fL (ref 80.0–100.0)
Monocytes Absolute: 0.3 10*3/uL (ref 0.1–1.0)
Monocytes Relative: 10 %
Neutro Abs: 0.8 10*3/uL — ABNORMAL LOW (ref 1.7–7.7)
Neutrophils Relative %: 29 %
Platelets: 187 10*3/uL (ref 150–400)
RBC: 4.98 MIL/uL (ref 3.87–5.11)
RDW: 14.7 % (ref 11.5–15.5)
WBC: 2.6 10*3/uL — ABNORMAL LOW (ref 4.0–10.5)
nRBC: 0 % (ref 0.0–0.2)

## 2022-06-09 LAB — BASIC METABOLIC PANEL
Anion gap: 9 (ref 5–15)
BUN: 13 mg/dL (ref 8–23)
CO2: 29 mmol/L (ref 22–32)
Calcium: 8.9 mg/dL (ref 8.9–10.3)
Chloride: 102 mmol/L (ref 98–111)
Creatinine, Ser: 0.97 mg/dL (ref 0.44–1.00)
GFR, Estimated: 57 mL/min — ABNORMAL LOW (ref >60)
Glucose, Bld: 111 mg/dL — ABNORMAL HIGH (ref 70–99)
Potassium: 3.8 mmol/L (ref 3.5–5.1)
Sodium: 140 mmol/L (ref 135–145)

## 2022-06-09 LAB — VITAMIN B12: Vitamin B-12: 1156 pg/mL — ABNORMAL HIGH (ref 180–914)

## 2022-06-09 MED ORDER — FILGRASTIM-SNDZ 480 MCG/0.8ML IJ SOSY
480.0000 ug | PREFILLED_SYRINGE | Freq: Once | INTRAMUSCULAR | Status: AC
  Administered 2022-06-09: 480 ug via SUBCUTANEOUS
  Filled 2022-06-09: qty 0.8

## 2022-06-09 NOTE — Progress Notes (Signed)
[place] NOTE  Patient Care Team: Baxter Hire, MD as PCP - General (Internal Medicine) Emmaline Kluver., MD (Rheumatology) Dasher, Rayvon Char, MD (Dermatology) Cammie Sickle, MD as Consulting Physician (Hematology and Oncology) Carloyn Manner, MD as Consulting Physician (Otolaryngology)  CHIEF COMPLAINTS/PURPOSE OF CONSULTATION: Autoimmune neutropenia  #April 2019- Autoimmune neutropenia-weekly Granix/CBC [Dr.Crocoran]; BMBx- no malignancy [Bone marrow aspirate and biopsy on 09/04/2017 revealed a hypercellular marrow for age with trilineage hematopoiesis.  There was abundant mature neutrophils.  Significant dyspoiesis or increased blasts was not identified.  Flow cytometry revealed a predominance of T lymphocytes (16% of all cells) with no abnormal phenotype.  There was a minor B-cell population (13% of lymphocytes) with slight kappa light chain excess.  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    HISTORY OF PRESENTING ILLNESS: Alone.  Ambulating independently.  [name] 88 y.o.  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  Patient dealing with GI issues and has been seen by GI MD. Patient has not needed any hospitalizations. Has not needed any antibiotics.  Denies any recent infections.  Patient continues to complain of easy bruising.    Review of Systems  Constitutional:  Negative for chills, diaphoresis, fever, malaise/fatigue and weight loss.  HENT:  Negative for nosebleeds and sore throat.    Eyes:  Negative for double vision.  Respiratory:  Negative for cough, hemoptysis, sputum production, shortness of breath and wheezing.   Cardiovascular:  Negative for chest pain, palpitations and orthopnea.  Gastrointestinal:  Negative for blood in stool, constipation, diarrhea, heartburn, melena, nausea and vomiting.  Genitourinary:  Negative for dysuria, frequency and urgency.  Musculoskeletal:  Negative for back pain and joint pain.  Skin:  Negative for itching.  Neurological:  Positive for tingling. Negative for dizziness, focal weakness, weakness and headaches.  Endo/Heme/Allergies:  Bruises/bleeds easily.  Psychiatric/Behavioral:  Negative for depression. The patient is nervous/anxious. The patient does not have insomnia.      MEDICAL HISTORY:  Past Medical History:  Diagnosis Date   Breast cancer ([place]) 1985   bilateral mastectomies   Depression    1 time specific situation that she did not know how to deal wiht   Diverticulosis    Hiatal hernia    History of Bell's palsy    History of colon polyps    History of COVID-19    History of nephrolithiasis    History of pancreatitis    Hyperlipidemia    IBS (irritable bowel syndrome)    Osteoporosis    osteopenia in neck   Skin cancer    Temporal arteritis ([place])     SURGICAL HISTORY: Past Surgical History:  Procedure Laterality Date   AUGMENTATION MAMMAPLASTY Bilateral 2000   COLON RESECTION SIGMOID N/A 10/13/2017   Procedure: COLON RESECTION SIGMOID;  Surgeon: Herbert Pun, MD;  Location: [facility] ORS;  Service: General;  Laterality: N/A;   COLOSTOMY N/A 10/13/2017   Procedure: COLOSTOMY;  Surgeon: Herbert Pun, MD;  Location: [facility] ORS;  Service: General;  Laterality: N/A;   LAPAROSCOPIC CHOLECYSTECTOMY  2009   MASTECTOMY  1983   bilateral  SOCIAL HISTORY: Social History   Socioeconomic History   Marital status: Widowed    Spouse name: Not on file   Number of children: 2   Years of education:  college   Highest education level: Not on file  Occupational History   Occupation: Engineer, production   Occupation: Retired  Tobacco Use   Smoking status: Former    Packs/day: 0.25    Years: 30.00    Total pack years: 7.50    Types: Cigarettes    Quit date: 05/17/2005    Years since quitting: 17.0   Smokeless tobacco: Never   Tobacco comments:    quit 11 years ago  Vaping Use   Vaping Use: Never used  Substance and Sexual Activity   Alcohol use: No    Alcohol/week: 0.0 standard drinks of alcohol   Drug use: No   Sexual activity: Not Currently  Other Topics Concern   Not on file  Social History Narrative   Patient lives at home alone.    Patient is retired.    Patient has some college.    Patient has 2 children.   Social Determinants of Health   Financial Resource Strain: Not on file  Food Insecurity: Not on file  Transportation Needs: Not on file  Physical Activity: Not on file  Stress: Not on file  Social Connections: Not on file  Intimate Partner Violence: Not on file    FAMILY HISTORY: Family History  Problem Relation Age of Onset   Colon cancer Mother 22   Scleroderma Father 10   Alcoholism Brother    Stomach cancer Neg Hx     ALLERGIES:  is allergic to morphine, benzalkonium chloride, escitalopram, hydralazine, neomycin-bacitracin zn-polymyx, and bacitracin-neomycin-polymyxin.  MEDICATIONS:  Current Outpatient Medications  Medication Sig Dispense Refill   ALPRAZolam (XANAX) 0.25 MG tablet Take 0.25 mg by mouth 2 (two) times daily as needed.      amLODipine (NORVASC) 5 MG tablet Take 5 mg by mouth daily. Pt states that she she increased the tablet to 5 mg daily. She was taking 2.5 mg. She stated that she thought the half tablet was not working efficiently. I asked the patient to inform her pcp that her dosage was increased.     Ascorbic Acid (VITAMIN C) 1000 MG tablet Take 1,000 mg by mouth daily.     aspirin EC 81 MG tablet Take 81 mg daily by mouth.     b  complex vitamins capsule Take 1 capsule by mouth daily.     Biotin 1000 MCG tablet Take 1,000 mcg by mouth daily.      Cholecalciferol (VITAMIN D3) 2000 units capsule Take 2,000 Units by mouth daily.     Cyanocobalamin (B-12 PO) Take 2,500 mcg by mouth daily.     filgrastim-sndz (ZARXIO) 480 MCG/0.8ML SOSY injection Inject 0.8 mLs (480 mcg total) into the skin once a week. Administer injection subcutaneously 3.2 mL 6   predniSONE (DELTASONE) 5 MG tablet Take 5 mg by mouth daily.     traMADol (ULTRAM) 50 MG tablet Take 50 mg by mouth daily.     ZINC OXIDE PO Take 1 tablet by mouth daily.     alendronate (FOSAMAX) 70 MG tablet Take 70 mg by mouth once a week. (Patient not taking: Reported on 06/09/2022)     No current facility-administered medications for this visit.   Facility-Administered Medications Ordered in Other Visits  Medication Dose Route Frequency Provider Last Rate Last Admin   filgrastim-sndz (ZARXIO) injection 480 mcg  480 mcg Subcutaneous Once Cammie Sickle, MD        PHYSICAL EXAMINATION: ECOG PERFORMANCE STATUS: 0 - Asymptomatic  Vitals:   06/09/22 0900  BP: (!) 168/80  Pulse: 81  Resp: 18  Temp: (!) 97 F (36.1 C)   Filed Weights   06/09/22 0900  Weight: 162 lb (73.5 kg)    Physical Exam Constitutional:      Comments: Alone.  HENT:     Head: Normocephalic and atraumatic.     Mouth/Throat:     Pharynx: No oropharyngeal exudate.  Eyes:     Pupils: Pupils are equal, round, and reactive to light.  Cardiovascular:     Rate and Rhythm: Normal rate and regular rhythm.  Pulmonary:     Effort: No respiratory distress.     Breath sounds: No wheezing.  Abdominal:     General: Bowel sounds are normal. There is no distension.     Palpations: Abdomen is soft. There is no mass.     Tenderness: There is no abdominal tenderness. There is no guarding or rebound.     Comments: Colostomy.  Parastomal hernia.  Musculoskeletal:        General: No tenderness.  Normal range of motion.     Cervical back: Normal range of motion and neck supple.  Skin:    General: Skin is warm.     Comments: Hyperpigmented rash noted bilateral shins.  Right arm bruising/thin skin.  Neurological:     Mental Status: She is alert and oriented to person, place, and time.  Psychiatric:        Mood and Affect: Affect normal.     Comments: Anxious.      LABORATORY DATA:  I have reviewed the data as listed Lab Results  Component Value Date   WBC 2.6 (L) 06/09/2022   HGB 14.4 06/09/2022   HCT 45.2 06/09/2022   MCV 90.8 06/09/2022   PLT 187 06/09/2022   Recent Labs    01/17/22 1133 03/14/22 1023 06/09/22 0906  NA 138 139 140  K 4.4 4.3 3.8  CL 101 102 102  CO2 '29 28 29  '$ GLUCOSE 117* 111* 111*  BUN '23 19 13  '$ CREATININE 1.04* 1.09* 0.97  CALCIUM 9.0 9.0 8.9  GFRNONAA 53* 50* 57*  PROT 7.3  --   --   ALBUMIN 4.2  --   --   AST 27  --   --   ALT 18  --   --   ALKPHOS 97  --   --   BILITOT 1.1  --   --     RADIOGRAPHIC STUDIES: I have personally reviewed the radiological images as listed and agreed with the findings in the report. No results found.   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  # Patient's WBC- today-2.8; ANC- 1.7 HOLD zarxio today; and  continue weekly zarxio.  Patient clinically asymptomatic.  Continue cbc/zarxioweekly/pt pref. STABLE; continue prednisone 5 mg/day [re- fill]; continue at current dose.   # Right LE infection s/p excision re: Skin cancer [Dr.Dasher]- ? [facility] for second opinion.    # Elevated BP [at home ~130]- STABLE.   # Hx of temporal arteritis->20 years on low dose prednisone-STABLE.    # Easy burising Bil LE/Upper extremity: sec to prednisone/senile purpura. STABLE.   # Hx of B12 deficiency- July 2023- > 3400- recommend B12 PO three times week.   #  DISPOSITION: # HOLD ZARXIO today # weekly cbc/zaxio x 12 # follow up with MD in 12   Weeks; - cbc/bmp/ b12 levels-zarxio-Dr.B  All questions were answered. The patient knows to call the clinic with any problems, questions or concerns.    Cammie Sickle, MD 06/09/2022 9:58 AM

## 2022-06-09 NOTE — Assessment & Plan Note (Addendum)
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  # Patient's WBC- today-2.6 ; ANC- 0.8 proceed with zarxio today; and  continue weekly zarxio.  Patient clinically asymptomatic.  Continue cbc/zarxioweekly/pt pref. STABLE; continue prednisone 5 mg/day [re- fill]; continue at current dose. STABLE.  # Abdominal discomfort/pain- [awaiting follow up with Dr.Anna ]  # Elevated BP [at home ~130]- STABLE.   # Hx of temporal arteritis->20 years on low dose prednisone-STABLE.    # Hx of B12 deficiency- July 2023- > 3400- recommend B12 PO three times week- STABLE.   # DISPOSITION: # proceed with ZARXIO today # weekly cbc/zaxio x 12 # follow up with MD in 12  Weeks; - cbc/bmp/ b12 levels-zarxio-Dr.B

## 2022-06-09 NOTE — Progress Notes (Signed)
Patient dealing with GI issues and has been seen by GI MD.  Not taking Fosamax and wondering if she should take again.

## 2022-06-16 ENCOUNTER — Other Ambulatory Visit: Payer: Medicare Other

## 2022-06-16 ENCOUNTER — Ambulatory Visit: Payer: Medicare Other

## 2022-06-20 ENCOUNTER — Inpatient Hospital Stay: Payer: Medicare Other

## 2022-06-20 DIAGNOSIS — D708 Other neutropenia: Secondary | ICD-10-CM | POA: Diagnosis not present

## 2022-06-20 DIAGNOSIS — Z9013 Acquired absence of bilateral breasts and nipples: Secondary | ICD-10-CM | POA: Diagnosis not present

## 2022-06-20 DIAGNOSIS — Z885 Allergy status to narcotic agent status: Secondary | ICD-10-CM | POA: Diagnosis not present

## 2022-06-20 DIAGNOSIS — Z87442 Personal history of urinary calculi: Secondary | ICD-10-CM | POA: Diagnosis not present

## 2022-06-20 DIAGNOSIS — Z79899 Other long term (current) drug therapy: Secondary | ICD-10-CM | POA: Diagnosis not present

## 2022-06-20 DIAGNOSIS — E785 Hyperlipidemia, unspecified: Secondary | ICD-10-CM | POA: Diagnosis not present

## 2022-06-20 DIAGNOSIS — Z7952 Long term (current) use of systemic steroids: Secondary | ICD-10-CM | POA: Diagnosis not present

## 2022-06-20 DIAGNOSIS — M316 Other giant cell arteritis: Secondary | ICD-10-CM | POA: Diagnosis not present

## 2022-06-20 DIAGNOSIS — Z8719 Personal history of other diseases of the digestive system: Secondary | ICD-10-CM | POA: Diagnosis not present

## 2022-06-20 DIAGNOSIS — Z85828 Personal history of other malignant neoplasm of skin: Secondary | ICD-10-CM | POA: Diagnosis not present

## 2022-06-20 DIAGNOSIS — D692 Other nonthrombocytopenic purpura: Secondary | ICD-10-CM | POA: Diagnosis not present

## 2022-06-20 DIAGNOSIS — Z8616 Personal history of COVID-19: Secondary | ICD-10-CM | POA: Diagnosis not present

## 2022-06-20 DIAGNOSIS — Z853 Personal history of malignant neoplasm of breast: Secondary | ICD-10-CM | POA: Diagnosis not present

## 2022-06-20 DIAGNOSIS — Z800 Family history of malignant neoplasm of digestive organs: Secondary | ICD-10-CM | POA: Diagnosis not present

## 2022-06-20 DIAGNOSIS — M359 Systemic involvement of connective tissue, unspecified: Secondary | ICD-10-CM | POA: Diagnosis not present

## 2022-06-20 DIAGNOSIS — Z9049 Acquired absence of other specified parts of digestive tract: Secondary | ICD-10-CM | POA: Diagnosis not present

## 2022-06-20 LAB — CBC WITH DIFFERENTIAL/PLATELET
Abs Immature Granulocytes: 0 10*3/uL (ref 0.00–0.07)
Basophils Absolute: 0 10*3/uL (ref 0.0–0.1)
Basophils Relative: 0 %
Eosinophils Absolute: 0 10*3/uL (ref 0.0–0.5)
Eosinophils Relative: 1 %
HCT: 43 % (ref 36.0–46.0)
Hemoglobin: 14.1 g/dL (ref 12.0–15.0)
Lymphocytes Relative: 24 %
Lymphs Abs: 0.7 10*3/uL (ref 0.7–4.0)
MCH: 29.4 pg (ref 26.0–34.0)
MCHC: 32.8 g/dL (ref 30.0–36.0)
MCV: 89.6 fL (ref 80.0–100.0)
Monocytes Absolute: 0.2 10*3/uL (ref 0.1–1.0)
Monocytes Relative: 5 %
Neutro Abs: 2.2 10*3/uL (ref 1.7–7.7)
Neutrophils Relative %: 70 %
Platelets: 198 10*3/uL (ref 150–400)
RBC: 4.8 MIL/uL (ref 3.87–5.11)
RDW: 14.8 % (ref 11.5–15.5)
Smear Review: NORMAL
WBC: 3.1 10*3/uL — ABNORMAL LOW (ref 4.0–10.5)
nRBC: 0 % (ref 0.0–0.2)

## 2022-06-23 ENCOUNTER — Ambulatory Visit: Payer: Medicare Other

## 2022-06-23 ENCOUNTER — Other Ambulatory Visit: Payer: Medicare Other

## 2022-06-23 ENCOUNTER — Telehealth: Payer: Self-pay | Admitting: Gastroenterology

## 2022-06-23 NOTE — Addendum Note (Signed)
Addended by: Wayna Chalet on: 06/23/2022 11:05 AM   Modules accepted: Orders

## 2022-06-23 NOTE — Telephone Encounter (Signed)
Patient is wondering if Dr. Vicente Males can send in a prescription for Linzess. Dr. Vicente Males had given her some samples.

## 2022-06-26 ENCOUNTER — Ambulatory Visit: Payer: Medicare Other | Admitting: Gastroenterology

## 2022-06-26 MED ORDER — LINACLOTIDE 145 MCG PO CAPS: 145.0000 ug | ORAL_CAPSULE | Freq: Every day | ORAL | 2 refills | Status: DC

## 2022-06-26 NOTE — Telephone Encounter (Signed)
Yes please send

## 2022-06-26 NOTE — Progress Notes (Deleted)
Tonya Bellows MD, MRCP(U.K) [address]  [place]  [place], [place] [address]  Main: [phone]  Fax: [phone]   Primary Care Physician: Baxter Hire, MD  Primary Gastroenterologist:  Dr. Jonathon Robbins   No chief complaint on file.   HPI: [name] is a 88 y.o. female   Summary of history : History of autoimmune neutropenia follows with hematology, hiatal hernia.  She has had abdominal pain in the past in the left lower quadrant multiple abscesses in the abdomen 3 years back after an episode of abdominal discomfort and was found to have autoimmune neutropenia and underwent surgery where part of the colon resected at initial visit all her GI symptoms had resolved CT scan of the abdomen showed no gross abnormality In November 2023 she came back with left lower quadrant pain of 10 days duration after not having a good bowel movement that I felt was related to constipation.   Interval history  05/30/2022-06/26/2022     She states that she forgets to take the MiraLAX and it causes her to get constipated.  She would like her to have a medication that she could take easily she is going up to 5 days without a bowel movement.  No normal pain   Current Outpatient Medications  Medication Sig Dispense Refill   alendronate (FOSAMAX) 70 MG tablet Take 70 mg by mouth once a week. (Patient not taking: Reported on 06/09/2022)     ALPRAZolam (XANAX) 0.25 MG tablet Take 0.25 mg by mouth 2 (two) times daily as needed.      amLODipine (NORVASC) 5 MG tablet Take 5 mg by mouth daily. Pt states that she she increased the tablet to 5 mg daily. She was taking 2.5 mg. She stated that she thought the half tablet was not working efficiently. I asked the patient to inform her pcp that her dosage was increased.     Ascorbic Acid (VITAMIN C) 1000 MG tablet Take 1,000 mg by mouth daily.     aspirin EC 81 MG tablet Take 81 mg daily by mouth.     b complex vitamins capsule Take 1 capsule by mouth  daily.     Biotin 1000 MCG tablet Take 1,000 mcg by mouth daily.      Cholecalciferol (VITAMIN D3) 2000 units capsule Take 2,000 Units by mouth daily.     Cyanocobalamin (B-12 PO) Take 2,500 mcg by mouth daily.     filgrastim-sndz (ZARXIO) 480 MCG/0.8ML SOSY injection Inject 0.8 mLs (480 mcg total) into the skin once a week. Administer injection subcutaneously 3.2 mL 6   predniSONE (DELTASONE) 5 MG tablet Take 5 mg by mouth daily.     traMADol (ULTRAM) 50 MG tablet Take 50 mg by mouth daily.     ZINC OXIDE PO Take 1 tablet by mouth daily.     No current facility-administered medications for this visit.    Allergies as of 06/26/2022 - Review Complete 06/09/2022  Allergen Reaction Noted   Morphine Other (See Comments) 07/12/2018   Benzalkonium chloride Dermatitis 04/14/2014   Escitalopram  10/20/2014   Hydralazine  07/25/2018   Neomycin-bacitracin zn-polymyx Dermatitis 06/08/2008   Bacitracin-neomycin-polymyxin Dermatitis 06/08/2008    ROS:  General: Negative for anorexia, weight loss, fever, chills, fatigue, weakness. ENT: Negative for hoarseness, difficulty swallowing , nasal congestion. CV: Negative for chest pain, angina, palpitations, dyspnea on exertion, peripheral edema.  Respiratory: Negative for dyspnea at rest, dyspnea on exertion, cough, sputum, wheezing.  GI: See history  of present illness. GU:  Negative for dysuria, hematuria, urinary incontinence, urinary frequency, nocturnal urination.  Endo: Negative for unusual weight change.    Physical Examination:   There were no vitals taken for this visit.  General: Well-nourished, well-developed in no acute distress.  Eyes: No icterus. Conjunctivae pink. Mouth: Oropharyngeal mucosa moist and pink , no lesions erythema or exudate. Lungs: Clear to auscultation bilaterally. Non-labored. Heart: Regular rate and rhythm, no murmurs rubs or gallops.  Abdomen: Bowel sounds are normal, nontender, nondistended, no  hepatosplenomegaly or masses, no abdominal bruits or hernia , no rebound or guarding.   Extremities: No lower extremity edema. No clubbing or deformities. Neuro: Alert and oriented x 3.  Grossly intact. Skin: Warm and dry, no jaundice.   Psych: Alert and cooperative, normal mood and affect.   Imaging Studies: No results found.  Assessment and Plan:   [name] is a 88 y.o. y/o female here to follow-up for left lower quadrant abdominal pain likely due to constipation commenced on MiraLAX last visit finding it hard to take the quantity required on a regular basis would like something simple like a pill. Started on Linzess 145 mcg a day. 10-day samples provided. Advised if it works we will provide a prescription otherwise we will titrate the dose if either working too well or not working at all. Down the road if symptoms are no better may need to consider sigmoidoscopy as a minimum   Dr Tonya Bellows  MD,MRCP [facility]) Follow up in ***

## 2022-06-26 NOTE — Telephone Encounter (Signed)
Called patient but had to leave her a detailed message letting her know that since Linzess was helping her and Dr. Vicente Males agreeing on sending in a prescription, then it was no need to see her. Therefore, he stated that patient not to be seen if doing well with Linzess. I let her know that I would cancel appointment.

## 2022-06-26 NOTE — Addendum Note (Signed)
Addended by: Wayna Chalet on: 06/26/2022 11:14 AM   Modules accepted: Orders

## 2022-06-27 ENCOUNTER — Inpatient Hospital Stay: Payer: Medicare Other

## 2022-06-27 DIAGNOSIS — M359 Systemic involvement of connective tissue, unspecified: Secondary | ICD-10-CM | POA: Diagnosis not present

## 2022-06-27 DIAGNOSIS — Z885 Allergy status to narcotic agent status: Secondary | ICD-10-CM | POA: Diagnosis not present

## 2022-06-27 DIAGNOSIS — Z9049 Acquired absence of other specified parts of digestive tract: Secondary | ICD-10-CM | POA: Diagnosis not present

## 2022-06-27 DIAGNOSIS — Z7952 Long term (current) use of systemic steroids: Secondary | ICD-10-CM | POA: Diagnosis not present

## 2022-06-27 DIAGNOSIS — Z85828 Personal history of other malignant neoplasm of skin: Secondary | ICD-10-CM | POA: Diagnosis not present

## 2022-06-27 DIAGNOSIS — D692 Other nonthrombocytopenic purpura: Secondary | ICD-10-CM | POA: Diagnosis not present

## 2022-06-27 DIAGNOSIS — D72819 Decreased white blood cell count, unspecified: Secondary | ICD-10-CM

## 2022-06-27 DIAGNOSIS — Z853 Personal history of malignant neoplasm of breast: Secondary | ICD-10-CM | POA: Diagnosis not present

## 2022-06-27 DIAGNOSIS — E785 Hyperlipidemia, unspecified: Secondary | ICD-10-CM | POA: Diagnosis not present

## 2022-06-27 DIAGNOSIS — Z9013 Acquired absence of bilateral breasts and nipples: Secondary | ICD-10-CM | POA: Diagnosis not present

## 2022-06-27 DIAGNOSIS — D708 Other neutropenia: Secondary | ICD-10-CM

## 2022-06-27 DIAGNOSIS — Z8719 Personal history of other diseases of the digestive system: Secondary | ICD-10-CM | POA: Diagnosis not present

## 2022-06-27 DIAGNOSIS — Z87442 Personal history of urinary calculi: Secondary | ICD-10-CM | POA: Diagnosis not present

## 2022-06-27 DIAGNOSIS — Z79899 Other long term (current) drug therapy: Secondary | ICD-10-CM | POA: Diagnosis not present

## 2022-06-27 DIAGNOSIS — Z8616 Personal history of COVID-19: Secondary | ICD-10-CM | POA: Diagnosis not present

## 2022-06-27 DIAGNOSIS — Z800 Family history of malignant neoplasm of digestive organs: Secondary | ICD-10-CM | POA: Diagnosis not present

## 2022-06-27 DIAGNOSIS — M316 Other giant cell arteritis: Secondary | ICD-10-CM | POA: Diagnosis not present

## 2022-06-27 LAB — CBC WITH DIFFERENTIAL/PLATELET
Abs Immature Granulocytes: 0.03 10*3/uL (ref 0.00–0.07)
Basophils Absolute: 0 10*3/uL (ref 0.0–0.1)
Basophils Relative: 1 %
Eosinophils Absolute: 0 10*3/uL (ref 0.0–0.5)
Eosinophils Relative: 1 %
HCT: 46.9 % — ABNORMAL HIGH (ref 36.0–46.0)
Hemoglobin: 14.7 g/dL (ref 12.0–15.0)
Immature Granulocytes: 1 %
Lymphocytes Relative: 54 %
Lymphs Abs: 1.2 10*3/uL (ref 0.7–4.0)
MCH: 28.8 pg (ref 26.0–34.0)
MCHC: 31.3 g/dL (ref 30.0–36.0)
MCV: 92 fL (ref 80.0–100.0)
Monocytes Absolute: 0.2 10*3/uL (ref 0.1–1.0)
Monocytes Relative: 10 %
Neutro Abs: 0.7 10*3/uL — ABNORMAL LOW (ref 1.7–7.7)
Neutrophils Relative %: 33 %
Platelets: 236 10*3/uL (ref 150–400)
RBC: 5.1 MIL/uL (ref 3.87–5.11)
RDW: 14.9 % (ref 11.5–15.5)
WBC: 2.2 10*3/uL — ABNORMAL LOW (ref 4.0–10.5)
nRBC: 0 % (ref 0.0–0.2)

## 2022-06-27 MED ORDER — FILGRASTIM-SNDZ 480 MCG/0.8ML IJ SOSY
480.0000 ug | PREFILLED_SYRINGE | Freq: Once | INTRAMUSCULAR | Status: AC
  Administered 2022-06-27: 480 ug via SUBCUTANEOUS
  Filled 2022-06-27: qty 0.8

## 2022-06-30 ENCOUNTER — Ambulatory Visit: Payer: Medicare Other

## 2022-06-30 ENCOUNTER — Other Ambulatory Visit: Payer: Medicare Other

## 2022-07-04 ENCOUNTER — Inpatient Hospital Stay: Payer: Medicare Other

## 2022-07-04 ENCOUNTER — Inpatient Hospital Stay: Payer: Medicare Other | Attending: Internal Medicine

## 2022-07-04 DIAGNOSIS — D72819 Decreased white blood cell count, unspecified: Secondary | ICD-10-CM | POA: Diagnosis not present

## 2022-07-04 DIAGNOSIS — D708 Other neutropenia: Secondary | ICD-10-CM

## 2022-07-04 DIAGNOSIS — Z79899 Other long term (current) drug therapy: Secondary | ICD-10-CM | POA: Insufficient documentation

## 2022-07-04 LAB — CBC WITH DIFFERENTIAL/PLATELET
Abs Immature Granulocytes: 0.08 10*3/uL — ABNORMAL HIGH (ref 0.00–0.07)
Basophils Absolute: 0 10*3/uL (ref 0.0–0.1)
Basophils Relative: 1 %
Eosinophils Absolute: 0 10*3/uL (ref 0.0–0.5)
Eosinophils Relative: 1 %
HCT: 48.9 % — ABNORMAL HIGH (ref 36.0–46.0)
Hemoglobin: 15.3 g/dL — ABNORMAL HIGH (ref 12.0–15.0)
Immature Granulocytes: 3 %
Lymphocytes Relative: 40 %
Lymphs Abs: 1.2 10*3/uL (ref 0.7–4.0)
MCH: 29 pg (ref 26.0–34.0)
MCHC: 31.3 g/dL (ref 30.0–36.0)
MCV: 92.6 fL (ref 80.0–100.0)
Monocytes Absolute: 0.2 10*3/uL (ref 0.1–1.0)
Monocytes Relative: 5 %
Neutro Abs: 1.5 10*3/uL — ABNORMAL LOW (ref 1.7–7.7)
Neutrophils Relative %: 50 %
Platelets: 234 10*3/uL (ref 150–400)
RBC: 5.28 MIL/uL — ABNORMAL HIGH (ref 3.87–5.11)
RDW: 15 % (ref 11.5–15.5)
Smear Review: ADEQUATE
WBC: 3 10*3/uL — ABNORMAL LOW (ref 4.0–10.5)
nRBC: 0 % (ref 0.0–0.2)

## 2022-07-04 MED ORDER — FILGRASTIM-SNDZ 480 MCG/0.8ML IJ SOSY
480.0000 ug | PREFILLED_SYRINGE | Freq: Once | INTRAMUSCULAR | Status: AC
  Administered 2022-07-04: 480 ug via SUBCUTANEOUS
  Filled 2022-07-04: qty 0.8

## 2022-07-05 DIAGNOSIS — E7800 Pure hypercholesterolemia, unspecified: Secondary | ICD-10-CM | POA: Diagnosis not present

## 2022-07-07 ENCOUNTER — Other Ambulatory Visit: Payer: Medicare Other

## 2022-07-07 ENCOUNTER — Ambulatory Visit: Payer: Medicare Other

## 2022-07-11 ENCOUNTER — Inpatient Hospital Stay: Payer: Medicare Other

## 2022-07-11 DIAGNOSIS — Z79899 Other long term (current) drug therapy: Secondary | ICD-10-CM | POA: Diagnosis not present

## 2022-07-11 DIAGNOSIS — D72819 Decreased white blood cell count, unspecified: Secondary | ICD-10-CM | POA: Diagnosis not present

## 2022-07-11 DIAGNOSIS — D708 Other neutropenia: Secondary | ICD-10-CM

## 2022-07-11 LAB — CBC WITH DIFFERENTIAL/PLATELET
Abs Immature Granulocytes: 0.01 10*3/uL (ref 0.00–0.07)
Basophils Absolute: 0 10*3/uL (ref 0.0–0.1)
Basophils Relative: 1 %
Eosinophils Absolute: 0 10*3/uL (ref 0.0–0.5)
Eosinophils Relative: 2 %
HCT: 46.2 % — ABNORMAL HIGH (ref 36.0–46.0)
Hemoglobin: 14.6 g/dL (ref 12.0–15.0)
Immature Granulocytes: 0 %
Lymphocytes Relative: 42 %
Lymphs Abs: 1.1 10*3/uL (ref 0.7–4.0)
MCH: 29.2 pg (ref 26.0–34.0)
MCHC: 31.6 g/dL (ref 30.0–36.0)
MCV: 92.4 fL (ref 80.0–100.0)
Monocytes Absolute: 0.3 10*3/uL (ref 0.1–1.0)
Monocytes Relative: 12 %
Neutro Abs: 1.1 10*3/uL — ABNORMAL LOW (ref 1.7–7.7)
Neutrophils Relative %: 43 %
Platelets: 219 10*3/uL (ref 150–400)
RBC: 5 MIL/uL (ref 3.87–5.11)
RDW: 15.3 % (ref 11.5–15.5)
Smear Review: NORMAL
WBC: 2.6 10*3/uL — ABNORMAL LOW (ref 4.0–10.5)
nRBC: 0 % (ref 0.0–0.2)

## 2022-07-11 MED ORDER — FILGRASTIM-SNDZ 480 MCG/0.8ML IJ SOSY
480.0000 ug | PREFILLED_SYRINGE | Freq: Once | INTRAMUSCULAR | Status: AC
  Administered 2022-07-11: 480 ug via SUBCUTANEOUS
  Filled 2022-07-11: qty 0.8

## 2022-07-12 DIAGNOSIS — M316 Other giant cell arteritis: Secondary | ICD-10-CM | POA: Diagnosis not present

## 2022-07-12 DIAGNOSIS — D704 Cyclic neutropenia: Secondary | ICD-10-CM | POA: Diagnosis not present

## 2022-07-12 DIAGNOSIS — Z0000 Encounter for general adult medical examination without abnormal findings: Secondary | ICD-10-CM | POA: Diagnosis not present

## 2022-07-12 DIAGNOSIS — M810 Age-related osteoporosis without current pathological fracture: Secondary | ICD-10-CM | POA: Diagnosis not present

## 2022-07-12 DIAGNOSIS — E7800 Pure hypercholesterolemia, unspecified: Secondary | ICD-10-CM | POA: Diagnosis not present

## 2022-07-14 ENCOUNTER — Ambulatory Visit: Payer: Medicare Other

## 2022-07-14 ENCOUNTER — Other Ambulatory Visit: Payer: Medicare Other

## 2022-07-18 ENCOUNTER — Inpatient Hospital Stay: Payer: Medicare Other

## 2022-07-18 DIAGNOSIS — D72819 Decreased white blood cell count, unspecified: Secondary | ICD-10-CM | POA: Diagnosis not present

## 2022-07-18 DIAGNOSIS — Z79899 Other long term (current) drug therapy: Secondary | ICD-10-CM | POA: Diagnosis not present

## 2022-07-18 DIAGNOSIS — D708 Other neutropenia: Secondary | ICD-10-CM

## 2022-07-18 LAB — CBC WITH DIFFERENTIAL/PLATELET
Abs Immature Granulocytes: 0 10*3/uL (ref 0.00–0.07)
Band Neutrophils: 1 %
Basophils Absolute: 0 10*3/uL (ref 0.0–0.1)
Basophils Relative: 0 %
Eosinophils Absolute: 0.1 10*3/uL (ref 0.0–0.5)
Eosinophils Relative: 2 %
HCT: 46.2 % — ABNORMAL HIGH (ref 36.0–46.0)
Hemoglobin: 14.6 g/dL (ref 12.0–15.0)
Lymphocytes Relative: 33 %
Lymphs Abs: 0.9 10*3/uL (ref 0.7–4.0)
MCH: 29.3 pg (ref 26.0–34.0)
MCHC: 31.6 g/dL (ref 30.0–36.0)
MCV: 92.6 fL (ref 80.0–100.0)
Metamyelocytes Relative: 1 %
Monocytes Absolute: 0.3 10*3/uL (ref 0.1–1.0)
Monocytes Relative: 12 %
Neutro Abs: 1.5 10*3/uL — ABNORMAL LOW (ref 1.7–7.7)
Neutrophils Relative %: 51 %
Platelets: 240 10*3/uL (ref 150–400)
RBC: 4.99 MIL/uL (ref 3.87–5.11)
RDW: 15.4 % (ref 11.5–15.5)
Smear Review: NORMAL
WBC: 2.8 10*3/uL — ABNORMAL LOW (ref 4.0–10.5)
nRBC: 0 % (ref 0.0–0.2)

## 2022-07-18 MED ORDER — FILGRASTIM-SNDZ 480 MCG/0.8ML IJ SOSY
480.0000 ug | PREFILLED_SYRINGE | Freq: Once | INTRAMUSCULAR | Status: AC
  Administered 2022-07-18: 480 ug via SUBCUTANEOUS
  Filled 2022-07-18: qty 0.8

## 2022-07-21 ENCOUNTER — Ambulatory Visit: Payer: Medicare Other

## 2022-07-21 ENCOUNTER — Other Ambulatory Visit: Payer: Medicare Other

## 2022-07-25 ENCOUNTER — Inpatient Hospital Stay: Payer: Medicare Other

## 2022-07-25 DIAGNOSIS — D708 Other neutropenia: Secondary | ICD-10-CM

## 2022-07-25 DIAGNOSIS — D72819 Decreased white blood cell count, unspecified: Secondary | ICD-10-CM | POA: Diagnosis not present

## 2022-07-25 DIAGNOSIS — Z79899 Other long term (current) drug therapy: Secondary | ICD-10-CM | POA: Diagnosis not present

## 2022-07-25 LAB — CBC WITH DIFFERENTIAL/PLATELET
Abs Immature Granulocytes: 0.06 10*3/uL (ref 0.00–0.07)
Basophils Absolute: 0 10*3/uL (ref 0.0–0.1)
Basophils Relative: 1 %
Eosinophils Absolute: 0 10*3/uL (ref 0.0–0.5)
Eosinophils Relative: 1 %
HCT: 46.9 % — ABNORMAL HIGH (ref 36.0–46.0)
Hemoglobin: 14.8 g/dL (ref 12.0–15.0)
Immature Granulocytes: 2 %
Lymphocytes Relative: 24 %
Lymphs Abs: 0.7 10*3/uL (ref 0.7–4.0)
MCH: 29 pg (ref 26.0–34.0)
MCHC: 31.6 g/dL (ref 30.0–36.0)
MCV: 91.8 fL (ref 80.0–100.0)
Monocytes Absolute: 0.4 10*3/uL (ref 0.1–1.0)
Monocytes Relative: 12 %
Neutro Abs: 1.9 10*3/uL (ref 1.7–7.7)
Neutrophils Relative %: 60 %
Platelets: 241 10*3/uL (ref 150–400)
RBC: 5.11 MIL/uL (ref 3.87–5.11)
RDW: 15.6 % — ABNORMAL HIGH (ref 11.5–15.5)
Smear Review: NORMAL
WBC: 3.1 10*3/uL — ABNORMAL LOW (ref 4.0–10.5)
nRBC: 0 % (ref 0.0–0.2)

## 2022-07-25 NOTE — Progress Notes (Signed)
ANC at 1.9 no injection today

## 2022-07-28 ENCOUNTER — Other Ambulatory Visit: Payer: Medicare Other

## 2022-07-28 ENCOUNTER — Ambulatory Visit: Payer: Medicare Other

## 2022-08-01 ENCOUNTER — Inpatient Hospital Stay: Payer: Medicare Other

## 2022-08-01 ENCOUNTER — Inpatient Hospital Stay: Payer: Medicare Other | Attending: Internal Medicine

## 2022-08-01 DIAGNOSIS — D708 Other neutropenia: Secondary | ICD-10-CM | POA: Diagnosis not present

## 2022-08-01 DIAGNOSIS — Z79899 Other long term (current) drug therapy: Secondary | ICD-10-CM | POA: Diagnosis not present

## 2022-08-01 LAB — CBC WITH DIFFERENTIAL/PLATELET
Abs Immature Granulocytes: 0.06 10*3/uL (ref 0.00–0.07)
Basophils Absolute: 0 10*3/uL (ref 0.0–0.1)
Basophils Relative: 1 %
Eosinophils Absolute: 0 10*3/uL (ref 0.0–0.5)
Eosinophils Relative: 1 %
HCT: 45.8 % (ref 36.0–46.0)
Hemoglobin: 14.2 g/dL (ref 12.0–15.0)
Immature Granulocytes: 2 %
Lymphocytes Relative: 32 %
Lymphs Abs: 1 10*3/uL (ref 0.7–4.0)
MCH: 28.9 pg (ref 26.0–34.0)
MCHC: 31 g/dL (ref 30.0–36.0)
MCV: 93.1 fL (ref 80.0–100.0)
Monocytes Absolute: 0.4 10*3/uL (ref 0.1–1.0)
Monocytes Relative: 11 %
Neutro Abs: 1.7 10*3/uL (ref 1.7–7.7)
Neutrophils Relative %: 53 %
Platelets: 239 10*3/uL (ref 150–400)
RBC: 4.92 MIL/uL (ref 3.87–5.11)
RDW: 15.5 % (ref 11.5–15.5)
WBC: 3.3 10*3/uL — ABNORMAL LOW (ref 4.0–10.5)
nRBC: 0 % (ref 0.0–0.2)

## 2022-08-01 NOTE — Progress Notes (Signed)
Zarxio held. Patient's wbc 3.3. injection not indicated.

## 2022-08-04 ENCOUNTER — Ambulatory Visit: Payer: Medicare Other

## 2022-08-04 ENCOUNTER — Other Ambulatory Visit: Payer: Medicare Other

## 2022-08-08 ENCOUNTER — Inpatient Hospital Stay: Payer: Medicare Other

## 2022-08-08 DIAGNOSIS — Z6828 Body mass index (BMI) 28.0-28.9, adult: Secondary | ICD-10-CM | POA: Diagnosis not present

## 2022-08-08 DIAGNOSIS — D72819 Decreased white blood cell count, unspecified: Secondary | ICD-10-CM

## 2022-08-08 DIAGNOSIS — Z1231 Encounter for screening mammogram for malignant neoplasm of breast: Secondary | ICD-10-CM | POA: Diagnosis not present

## 2022-08-08 DIAGNOSIS — Z79899 Other long term (current) drug therapy: Secondary | ICD-10-CM | POA: Diagnosis not present

## 2022-08-08 DIAGNOSIS — D708 Other neutropenia: Secondary | ICD-10-CM

## 2022-08-08 DIAGNOSIS — Z01419 Encounter for gynecological examination (general) (routine) without abnormal findings: Secondary | ICD-10-CM | POA: Diagnosis not present

## 2022-08-08 LAB — CBC WITH DIFFERENTIAL/PLATELET
Abs Immature Granulocytes: 0.04 10*3/uL (ref 0.00–0.07)
Basophils Absolute: 0 10*3/uL (ref 0.0–0.1)
Basophils Relative: 1 %
Eosinophils Absolute: 0.1 10*3/uL (ref 0.0–0.5)
Eosinophils Relative: 3 %
HCT: 46.7 % — ABNORMAL HIGH (ref 36.0–46.0)
Hemoglobin: 14.7 g/dL (ref 12.0–15.0)
Immature Granulocytes: 1 %
Lymphocytes Relative: 61 %
Lymphs Abs: 1.7 10*3/uL (ref 0.7–4.0)
MCH: 29.2 pg (ref 26.0–34.0)
MCHC: 31.5 g/dL (ref 30.0–36.0)
MCV: 92.7 fL (ref 80.0–100.0)
Monocytes Absolute: 0.4 10*3/uL (ref 0.1–1.0)
Monocytes Relative: 13 %
Neutro Abs: 0.6 10*3/uL — ABNORMAL LOW (ref 1.7–7.7)
Neutrophils Relative %: 21 %
Platelets: 238 10*3/uL (ref 150–400)
RBC: 5.04 MIL/uL (ref 3.87–5.11)
RDW: 15.4 % (ref 11.5–15.5)
WBC: 2.8 10*3/uL — ABNORMAL LOW (ref 4.0–10.5)
nRBC: 0 % (ref 0.0–0.2)

## 2022-08-08 MED ORDER — FILGRASTIM-SNDZ 480 MCG/0.8ML IJ SOSY
480.0000 ug | PREFILLED_SYRINGE | Freq: Once | INTRAMUSCULAR | Status: AC
  Administered 2022-08-08: 480 ug via SUBCUTANEOUS
  Filled 2022-08-08: qty 0.8

## 2022-08-11 ENCOUNTER — Other Ambulatory Visit: Payer: Medicare Other

## 2022-08-11 ENCOUNTER — Ambulatory Visit: Payer: Medicare Other

## 2022-08-15 ENCOUNTER — Inpatient Hospital Stay: Payer: Medicare Other

## 2022-08-15 ENCOUNTER — Telehealth: Payer: Self-pay | Admitting: Gastroenterology

## 2022-08-15 DIAGNOSIS — D72819 Decreased white blood cell count, unspecified: Secondary | ICD-10-CM

## 2022-08-15 DIAGNOSIS — D708 Other neutropenia: Secondary | ICD-10-CM

## 2022-08-15 DIAGNOSIS — Z79899 Other long term (current) drug therapy: Secondary | ICD-10-CM | POA: Diagnosis not present

## 2022-08-15 LAB — CBC WITH DIFFERENTIAL/PLATELET
Abs Immature Granulocytes: 0.06 10*3/uL (ref 0.00–0.07)
Basophils Absolute: 0 10*3/uL (ref 0.0–0.1)
Basophils Relative: 1 %
Eosinophils Absolute: 0.1 10*3/uL (ref 0.0–0.5)
Eosinophils Relative: 2 %
HCT: 47.9 % — ABNORMAL HIGH (ref 36.0–46.0)
Hemoglobin: 14.9 g/dL (ref 12.0–15.0)
Immature Granulocytes: 2 %
Lymphocytes Relative: 44 %
Lymphs Abs: 1.2 10*3/uL (ref 0.7–4.0)
MCH: 28.4 pg (ref 26.0–34.0)
MCHC: 31.1 g/dL (ref 30.0–36.0)
MCV: 91.4 fL (ref 80.0–100.0)
Monocytes Absolute: 0.2 10*3/uL (ref 0.1–1.0)
Monocytes Relative: 8 %
Neutro Abs: 1.2 10*3/uL — ABNORMAL LOW (ref 1.7–7.7)
Neutrophils Relative %: 43 %
Platelets: 219 10*3/uL (ref 150–400)
RBC: 5.24 MIL/uL — ABNORMAL HIGH (ref 3.87–5.11)
RDW: 15.5 % (ref 11.5–15.5)
Smear Review: NORMAL
WBC: 2.7 10*3/uL — ABNORMAL LOW (ref 4.0–10.5)
nRBC: 0 % (ref 0.0–0.2)

## 2022-08-15 MED ORDER — FILGRASTIM-SNDZ 480 MCG/0.8ML IJ SOSY
480.0000 ug | PREFILLED_SYRINGE | Freq: Once | INTRAMUSCULAR | Status: AC
  Administered 2022-08-15: 480 ug via SUBCUTANEOUS
  Filled 2022-08-15: qty 0.8

## 2022-08-15 NOTE — Telephone Encounter (Signed)
Wait list for June sched

## 2022-08-18 ENCOUNTER — Other Ambulatory Visit: Payer: Medicare Other

## 2022-08-18 ENCOUNTER — Ambulatory Visit: Payer: Medicare Other

## 2022-08-22 ENCOUNTER — Inpatient Hospital Stay: Payer: Medicare Other

## 2022-08-22 DIAGNOSIS — D708 Other neutropenia: Secondary | ICD-10-CM

## 2022-08-22 DIAGNOSIS — Z79899 Other long term (current) drug therapy: Secondary | ICD-10-CM | POA: Diagnosis not present

## 2022-08-22 DIAGNOSIS — D72819 Decreased white blood cell count, unspecified: Secondary | ICD-10-CM

## 2022-08-22 LAB — CBC WITH DIFFERENTIAL/PLATELET
Abs Immature Granulocytes: 0.13 10*3/uL — ABNORMAL HIGH (ref 0.00–0.07)
Basophils Absolute: 0 10*3/uL (ref 0.0–0.1)
Basophils Relative: 1 %
Eosinophils Absolute: 0.1 10*3/uL (ref 0.0–0.5)
Eosinophils Relative: 3 %
HCT: 44.5 % (ref 36.0–46.0)
Hemoglobin: 13.9 g/dL (ref 12.0–15.0)
Immature Granulocytes: 5 %
Lymphocytes Relative: 50 %
Lymphs Abs: 1.3 10*3/uL (ref 0.7–4.0)
MCH: 28.9 pg (ref 26.0–34.0)
MCHC: 31.2 g/dL (ref 30.0–36.0)
MCV: 92.5 fL (ref 80.0–100.0)
Monocytes Absolute: 0.3 10*3/uL (ref 0.1–1.0)
Monocytes Relative: 11 %
Neutro Abs: 0.8 10*3/uL — ABNORMAL LOW (ref 1.7–7.7)
Neutrophils Relative %: 30 %
Platelets: 198 10*3/uL (ref 150–400)
RBC: 4.81 MIL/uL (ref 3.87–5.11)
RDW: 15.7 % — ABNORMAL HIGH (ref 11.5–15.5)
Smear Review: NORMAL
WBC: 2.6 10*3/uL — ABNORMAL LOW (ref 4.0–10.5)
nRBC: 0 % (ref 0.0–0.2)

## 2022-08-22 MED ORDER — FILGRASTIM-SNDZ 480 MCG/0.8ML IJ SOSY
480.0000 ug | PREFILLED_SYRINGE | Freq: Once | INTRAMUSCULAR | Status: AC
  Administered 2022-08-22: 480 ug via SUBCUTANEOUS
  Filled 2022-08-22: qty 0.8

## 2022-08-25 ENCOUNTER — Ambulatory Visit: Payer: Medicare Other

## 2022-08-25 ENCOUNTER — Other Ambulatory Visit: Payer: Medicare Other

## 2022-08-29 ENCOUNTER — Inpatient Hospital Stay: Payer: Medicare Other

## 2022-08-29 ENCOUNTER — Inpatient Hospital Stay: Payer: Medicare Other | Attending: Internal Medicine

## 2022-08-29 DIAGNOSIS — Z800 Family history of malignant neoplasm of digestive organs: Secondary | ICD-10-CM | POA: Insufficient documentation

## 2022-08-29 DIAGNOSIS — D708 Other neutropenia: Secondary | ICD-10-CM | POA: Insufficient documentation

## 2022-08-29 DIAGNOSIS — Z8719 Personal history of other diseases of the digestive system: Secondary | ICD-10-CM | POA: Insufficient documentation

## 2022-08-29 DIAGNOSIS — Z8616 Personal history of COVID-19: Secondary | ICD-10-CM | POA: Diagnosis not present

## 2022-08-29 DIAGNOSIS — Z87442 Personal history of urinary calculi: Secondary | ICD-10-CM | POA: Insufficient documentation

## 2022-08-29 DIAGNOSIS — Z85828 Personal history of other malignant neoplasm of skin: Secondary | ICD-10-CM | POA: Insufficient documentation

## 2022-08-29 DIAGNOSIS — E785 Hyperlipidemia, unspecified: Secondary | ICD-10-CM | POA: Insufficient documentation

## 2022-08-29 DIAGNOSIS — Z885 Allergy status to narcotic agent status: Secondary | ICD-10-CM | POA: Insufficient documentation

## 2022-08-29 DIAGNOSIS — Z840 Family history of diseases of the skin and subcutaneous tissue: Secondary | ICD-10-CM | POA: Diagnosis not present

## 2022-08-29 DIAGNOSIS — Z9049 Acquired absence of other specified parts of digestive tract: Secondary | ICD-10-CM | POA: Diagnosis not present

## 2022-08-29 DIAGNOSIS — Z79899 Other long term (current) drug therapy: Secondary | ICD-10-CM | POA: Diagnosis not present

## 2022-08-29 DIAGNOSIS — Z9013 Acquired absence of bilateral breasts and nipples: Secondary | ICD-10-CM | POA: Insufficient documentation

## 2022-08-29 DIAGNOSIS — R030 Elevated blood-pressure reading, without diagnosis of hypertension: Secondary | ICD-10-CM | POA: Insufficient documentation

## 2022-08-29 DIAGNOSIS — Z7952 Long term (current) use of systemic steroids: Secondary | ICD-10-CM | POA: Diagnosis not present

## 2022-08-29 DIAGNOSIS — M316 Other giant cell arteritis: Secondary | ICD-10-CM | POA: Diagnosis not present

## 2022-08-29 DIAGNOSIS — Z87891 Personal history of nicotine dependence: Secondary | ICD-10-CM | POA: Insufficient documentation

## 2022-08-29 DIAGNOSIS — Z811 Family history of alcohol abuse and dependence: Secondary | ICD-10-CM | POA: Insufficient documentation

## 2022-08-29 LAB — CBC WITH DIFFERENTIAL/PLATELET
Abs Immature Granulocytes: 0 10*3/uL (ref 0.00–0.07)
Band Neutrophils: 1 %
Basophils Absolute: 0 10*3/uL (ref 0.0–0.1)
Basophils Relative: 0 %
Eosinophils Absolute: 0 10*3/uL (ref 0.0–0.5)
Eosinophils Relative: 0 %
HCT: 43.9 % (ref 36.0–46.0)
Hemoglobin: 14 g/dL (ref 12.0–15.0)
Lymphocytes Relative: 28 %
Lymphs Abs: 1 10*3/uL (ref 0.7–4.0)
MCH: 29.4 pg (ref 26.0–34.0)
MCHC: 31.9 g/dL (ref 30.0–36.0)
MCV: 92.2 fL (ref 80.0–100.0)
Monocytes Absolute: 0.2 10*3/uL (ref 0.1–1.0)
Monocytes Relative: 5 %
Neutro Abs: 2.3 10*3/uL (ref 1.7–7.7)
Neutrophils Relative %: 66 %
Platelets: 231 10*3/uL (ref 150–400)
RBC: 4.76 MIL/uL (ref 3.87–5.11)
RDW: 15.6 % — ABNORMAL HIGH (ref 11.5–15.5)
Smear Review: ADEQUATE
WBC: 3.5 10*3/uL — ABNORMAL LOW (ref 4.0–10.5)
nRBC: 0 % (ref 0.0–0.2)

## 2022-08-29 NOTE — Progress Notes (Signed)
Patient anc is 2.3 no injection today

## 2022-09-01 ENCOUNTER — Ambulatory Visit: Payer: Medicare Other | Admitting: Internal Medicine

## 2022-09-01 ENCOUNTER — Ambulatory Visit: Payer: Medicare Other

## 2022-09-01 ENCOUNTER — Other Ambulatory Visit: Payer: Medicare Other

## 2022-09-04 ENCOUNTER — Telehealth: Payer: Self-pay | Admitting: *Deleted

## 2022-09-04 NOTE — Telephone Encounter (Signed)
Call returned to patient and informed of doctor response and she does not have fevers so she states she will keep her appointment as is

## 2022-09-04 NOTE — Telephone Encounter (Signed)
Patient called reporting that she haas had te flu for the past week and thinks that she is over it now an dis asking if she should keep her appointment tomorrow or give her self more recovery time. She is for lab/ doctor/ Zarxio tomorrow

## 2022-09-05 ENCOUNTER — Inpatient Hospital Stay: Payer: Medicare Other

## 2022-09-05 ENCOUNTER — Inpatient Hospital Stay (HOSPITAL_BASED_OUTPATIENT_CLINIC_OR_DEPARTMENT_OTHER): Payer: Medicare Other | Admitting: Internal Medicine

## 2022-09-05 ENCOUNTER — Encounter: Payer: Self-pay | Admitting: Internal Medicine

## 2022-09-05 VITALS — BP 171/70 | HR 81 | Temp 96.9°F | Resp 17 | Ht 65.0 in | Wt 158.6 lb

## 2022-09-05 DIAGNOSIS — Z9013 Acquired absence of bilateral breasts and nipples: Secondary | ICD-10-CM | POA: Diagnosis not present

## 2022-09-05 DIAGNOSIS — R030 Elevated blood-pressure reading, without diagnosis of hypertension: Secondary | ICD-10-CM | POA: Diagnosis not present

## 2022-09-05 DIAGNOSIS — Z79899 Other long term (current) drug therapy: Secondary | ICD-10-CM | POA: Diagnosis not present

## 2022-09-05 DIAGNOSIS — E785 Hyperlipidemia, unspecified: Secondary | ICD-10-CM | POA: Diagnosis not present

## 2022-09-05 DIAGNOSIS — D708 Other neutropenia: Secondary | ICD-10-CM | POA: Diagnosis not present

## 2022-09-05 DIAGNOSIS — D72819 Decreased white blood cell count, unspecified: Secondary | ICD-10-CM

## 2022-09-05 DIAGNOSIS — Z87442 Personal history of urinary calculi: Secondary | ICD-10-CM | POA: Diagnosis not present

## 2022-09-05 DIAGNOSIS — Z800 Family history of malignant neoplasm of digestive organs: Secondary | ICD-10-CM | POA: Diagnosis not present

## 2022-09-05 DIAGNOSIS — Z885 Allergy status to narcotic agent status: Secondary | ICD-10-CM | POA: Diagnosis not present

## 2022-09-05 DIAGNOSIS — M316 Other giant cell arteritis: Secondary | ICD-10-CM | POA: Diagnosis not present

## 2022-09-05 DIAGNOSIS — Z811 Family history of alcohol abuse and dependence: Secondary | ICD-10-CM | POA: Diagnosis not present

## 2022-09-05 DIAGNOSIS — Z9049 Acquired absence of other specified parts of digestive tract: Secondary | ICD-10-CM | POA: Diagnosis not present

## 2022-09-05 DIAGNOSIS — Z87891 Personal history of nicotine dependence: Secondary | ICD-10-CM | POA: Diagnosis not present

## 2022-09-05 DIAGNOSIS — Z8719 Personal history of other diseases of the digestive system: Secondary | ICD-10-CM | POA: Diagnosis not present

## 2022-09-05 DIAGNOSIS — Z85828 Personal history of other malignant neoplasm of skin: Secondary | ICD-10-CM | POA: Diagnosis not present

## 2022-09-05 DIAGNOSIS — Z8616 Personal history of COVID-19: Secondary | ICD-10-CM | POA: Diagnosis not present

## 2022-09-05 DIAGNOSIS — Z7952 Long term (current) use of systemic steroids: Secondary | ICD-10-CM | POA: Diagnosis not present

## 2022-09-05 LAB — CBC WITH DIFFERENTIAL/PLATELET
Abs Immature Granulocytes: 0.04 10*3/uL (ref 0.00–0.07)
Basophils Absolute: 0 10*3/uL (ref 0.0–0.1)
Basophils Relative: 1 %
Eosinophils Absolute: 0 10*3/uL (ref 0.0–0.5)
Eosinophils Relative: 0 %
HCT: 46.5 % — ABNORMAL HIGH (ref 36.0–46.0)
Hemoglobin: 14.7 g/dL (ref 12.0–15.0)
Immature Granulocytes: 2 %
Lymphocytes Relative: 52 %
Lymphs Abs: 1.3 10*3/uL (ref 0.7–4.0)
MCH: 29.2 pg (ref 26.0–34.0)
MCHC: 31.6 g/dL (ref 30.0–36.0)
MCV: 92.3 fL (ref 80.0–100.0)
Monocytes Absolute: 0.2 10*3/uL (ref 0.1–1.0)
Monocytes Relative: 8 %
Neutro Abs: 0.9 10*3/uL — ABNORMAL LOW (ref 1.7–7.7)
Neutrophils Relative %: 37 %
Platelets: 266 10*3/uL (ref 150–400)
RBC: 5.04 MIL/uL (ref 3.87–5.11)
RDW: 14.6 % (ref 11.5–15.5)
WBC: 2.5 10*3/uL — ABNORMAL LOW (ref 4.0–10.5)
nRBC: 0 % (ref 0.0–0.2)

## 2022-09-05 LAB — BASIC METABOLIC PANEL
Anion gap: 9 (ref 5–15)
BUN: 18 mg/dL (ref 8–23)
CO2: 27 mmol/L (ref 22–32)
Calcium: 8.9 mg/dL (ref 8.9–10.3)
Chloride: 103 mmol/L (ref 98–111)
Creatinine, Ser: 1.09 mg/dL — ABNORMAL HIGH (ref 0.44–1.00)
GFR, Estimated: 49 mL/min — ABNORMAL LOW (ref >60)
Glucose, Bld: 106 mg/dL — ABNORMAL HIGH (ref 70–99)
Potassium: 3.2 mmol/L — ABNORMAL LOW (ref 3.5–5.1)
Sodium: 139 mmol/L (ref 135–145)

## 2022-09-05 LAB — VITAMIN B12: Vitamin B-12: 3194 pg/mL — ABNORMAL HIGH (ref 180–914)

## 2022-09-05 MED ORDER — FILGRASTIM-SNDZ 480 MCG/0.8ML IJ SOSY
480.0000 ug | PREFILLED_SYRINGE | Freq: Once | INTRAMUSCULAR | Status: AC
  Administered 2022-09-05: 480 ug via SUBCUTANEOUS

## 2022-09-05 NOTE — Progress Notes (Signed)
Patient has no concerns today and is feeling well.

## 2022-09-05 NOTE — Progress Notes (Signed)
[facility] CONSULT NOTE  Patient Care Team: Gracelyn Nurse, MD as PCP - General (Internal Medicine) Kandyce Rud., MD (Rheumatology) Dasher, Cliffton Asters, MD (Dermatology) Earna Coder, MD as Consulting Physician (Hematology and Oncology) Bud Face, MD as Consulting Physician (Otolaryngology)  CHIEF COMPLAINTS/PURPOSE OF CONSULTATION: Autoimmune neutropenia  #April 2019- Autoimmune neutropenia-weekly Granix/CBC [Dr.Crocoran]; BMBx- no malignancy [Bone marrow aspirate and biopsy on 09/04/2017 revealed a hypercellular marrow for age with trilineage hematopoiesis.  There was abundant mature neutrophils.  Significant dyspoiesis or increased blasts was not identified.  Flow cytometry revealed a predominance of T lymphocytes (16% of all cells) with no abnormal phenotype.  There was a minor B-cell population (13% of lymphocytes) with slight kappa light chain excess.  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    HISTORY OF PRESENTING ILLNESS: Alone.  Ambulating independently.  [name] 88 y.o.  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  Patient has no concerns today and is feeling well.  Patient had a recent URI/COVID positive. S/p anti-viral resolved.   Patient has not needed any hospitalizations. Patient continues to complain of easy bruising.    Review of Systems  Constitutional:  Negative for chills, diaphoresis, fever, malaise/fatigue and weight loss.  HENT:  Negative for nosebleeds and sore throat.    Eyes:  Negative for double vision.  Respiratory:  Negative for cough, hemoptysis, sputum production, shortness of breath and wheezing.   Cardiovascular:  Negative for chest pain, palpitations and orthopnea.  Gastrointestinal:  Negative for blood in stool, constipation, diarrhea, heartburn, melena, nausea and vomiting.  Genitourinary:  Negative for dysuria, frequency and urgency.  Musculoskeletal:  Negative for back pain and joint pain.  Skin:  Negative for itching.  Neurological:  Positive for tingling. Negative for dizziness, focal weakness, weakness and headaches.  Endo/Heme/Allergies:  Bruises/bleeds easily.  Psychiatric/Behavioral:  Negative for depression. The patient is nervous/anxious. The patient does not have insomnia.      MEDICAL HISTORY:  Past Medical History:  Diagnosis Date   Breast cancer 1985   bilateral mastectomies   Depression    1 time specific situation that she did not know how to deal wiht   Diverticulosis    Hiatal hernia    History of Bell's palsy    History of colon polyps    History of COVID-19    History of nephrolithiasis    History of pancreatitis    Hyperlipidemia    IBS (irritable bowel syndrome)    Osteoporosis    osteopenia in neck   Skin cancer    Temporal arteritis     SURGICAL HISTORY: Past Surgical History:  Procedure Laterality Date   AUGMENTATION MAMMAPLASTY Bilateral 2000   COLON RESECTION SIGMOID N/A 10/13/2017   Procedure: COLON RESECTION SIGMOID;  Surgeon: Carolan Shiver, MD;  Location: [facility] ORS;  Service: General;  Laterality: N/A;   COLOSTOMY N/A 10/13/2017   Procedure: COLOSTOMY;  Surgeon: Carolan Shiver, MD;  Location: [facility] ORS;  Service: General;  Laterality: N/A;   LAPAROSCOPIC CHOLECYSTECTOMY  2009   MASTECTOMY  1983   bilateral    SOCIAL  HISTORY: Social History   Socioeconomic History   Marital status: Widowed    Spouse name: Not on file   Number of children: 2   Years of education: college    Highest education level: Not on file  Occupational History   Occupation: Public relations account executiveengineering   Occupation: Retired  Tobacco Use   Smoking status: Former    Packs/day: 0.25    Years: 30.00    Additional pack years: 0.00    Total pack years: 7.50    Types: Cigarettes    Quit date: 05/17/2005    Years since quitting: 17.3   Smokeless tobacco: Never   Tobacco comments:    quit 11 years ago  Vaping Use   Vaping Use: Never used  Substance and Sexual Activity   Alcohol use: No    Alcohol/week: 0.0 standard drinks of alcohol   Drug use: No   Sexual activity: Not Currently  Other Topics Concern   Not on file  Social History Narrative   Patient lives at home alone.    Patient is retired.    Patient has some college.    Patient has 2 children.   Social Determinants of Health   Financial Resource Strain: Not on file  Food Insecurity: Not on file  Transportation Needs: Not on file  Physical Activity: Not on file  Stress: Not on file  Social Connections: Not on file  Intimate Partner Violence: Not on file    FAMILY HISTORY: Family History  Problem Relation Age of Onset   Colon cancer Mother [id number]   Scleroderma Father [id number]   Alcoholism Brother    Stomach cancer Neg Hx     ALLERGIES:  is allergic to morphine, benzalkonium chloride, escitalopram, hydralazine, neomycin-bacitracin zn-polymyx, and bacitracin-neomycin-polymyxin.  MEDICATIONS:  Current Outpatient Medications  Medication Sig Dispense Refill   ALPRAZolam (XANAX) 0.25 MG tablet Take 0.25 mg by mouth 2 (two) times daily as needed.      Ascorbic Acid (VITAMIN C) 1000 MG tablet Take 1,000 mg by mouth daily.     aspirin EC 81 MG tablet Take 81 mg daily by mouth.     b complex vitamins capsule Take 1 capsule by mouth daily.     Biotin 1000 MCG tablet Take 1,000 mcg by mouth daily.      Cholecalciferol (VITAMIN D3) 2000 units capsule Take 2,000 Units by mouth daily.     Cyanocobalamin (B-12 PO) Take 2,500 mcg by mouth daily.      linaclotide (LINZESS) 145 MCG CAPS capsule Take 1 capsule (145 mcg total) by mouth daily. 30 capsule 2   predniSONE (DELTASONE) 5 MG tablet Take 5 mg by mouth daily.     traMADol (ULTRAM) 50 MG tablet Take 50 mg by mouth daily.     ZINC OXIDE PO Take 1 tablet by mouth daily.     alendronate (FOSAMAX) 70 MG tablet Take 70 mg by mouth once a week. (Patient not taking: Reported on 06/09/2022)     amLODipine (NORVASC) 5 MG tablet Take 5 mg by mouth daily. Pt states that she she increased the tablet to 5 mg daily. She was taking 2.5 mg. She stated that she thought the half tablet was not working efficiently. I asked the patient to inform her pcp that her dosage was increased.     filgrastim-sndz (ZARXIO) 480 MCG/0.8ML SOSY injection Inject 0.8 mLs (480 mcg total) into the skin once a week. Administer injection subcutaneously (Patient not taking: Reported on 09/05/2022) 3.2 mL 6  No current facility-administered medications for this visit.    PHYSICAL EXAMINATION: ECOG PERFORMANCE STATUS: 0 - Asymptomatic  Vitals:   09/05/22 1027  BP: (!) 171/70  Pulse: 81  Resp: 17  Temp: (!) 96.9 F (36.1 C)  SpO2: 99%   Filed Weights   09/05/22 1027  Weight: 158 lb 9.6 oz (71.9 kg)    Physical Exam Constitutional:      Comments: Alone.  HENT:     Head: Normocephalic and atraumatic.     Mouth/Throat:     Pharynx: No oropharyngeal exudate.  Eyes:     Pupils: Pupils are equal, round, and reactive to light.  Cardiovascular:     Rate and Rhythm: Normal rate and regular rhythm.  Pulmonary:     Effort: No respiratory distress.     Breath sounds: No wheezing.  Abdominal:     General: Bowel sounds are normal. There is no distension.     Palpations: Abdomen is soft. There is no mass.     Tenderness: There is no abdominal tenderness. There is no guarding or rebound.     Comments: Colostomy.  Parastomal hernia.  Musculoskeletal:        General: No tenderness. Normal range of motion.     Cervical back:  Normal range of motion and neck supple.  Skin:    General: Skin is warm.     Comments: Hyperpigmented rash noted bilateral shins.  Right arm bruising/thin skin.  Neurological:     Mental Status: She is alert and oriented to person, place, and time.  Psychiatric:        Mood and Affect: Affect normal.     Comments: Anxious.      LABORATORY DATA:  I have reviewed the data as listed Lab Results  Component Value Date   WBC 2.5 (L) 09/05/2022   HGB 14.7 09/05/2022   HCT 46.5 (H) 09/05/2022   MCV 92.3 09/05/2022   PLT 266 09/05/2022   Recent Labs    01/17/22 1133 03/14/22 1023 06/09/22 0906 09/05/22 1005  NA 138 139 140 139  K 4.4 4.3 3.8 3.2*  CL 101 102 102 103  CO2 29 28 29 27   GLUCOSE 117* 111* 111* 106*  BUN 23 19 13 18   CREATININE 1.04* 1.09* 0.97 1.09*  CALCIUM 9.0 9.0 8.9 8.9  GFRNONAA 53* 50* 57* 49*  PROT 7.3  --   --   --   ALBUMIN 4.2  --   --   --   AST 27  --   --   --   ALT 18  --   --   --   ALKPHOS 97  --   --   --   BILITOT 1.1  --   --   --     RADIOGRAPHIC STUDIES: I have personally reviewed the radiological images as listed and agreed with the findings in the report. No results found.   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  # Patient's WBC- today-2.6 ; ANC- 0.9 proceed with zarxio today; and  continue weekly zarxio.  Patient clinically asymptomatic.  Continue cbc/zarxioweekly/pt pref. stable; continue prednisone 5 mg/day [re- fill]; continue at current dose.   # Recent COVID s/p Anti-viral resolved.   # Elevated BP [at home ~130]- stable.   # Hx of temporal arteritis->20 years on low dose prednisone-stable.    # Hx of B12 deficiency- July 2023- > 3400-  recommend B12 PO three times week- stable.   # DISPOSITION: # proceed with ZARXIO today # weekly cbc/zaxio x 12 # follow up with MD in 12  Weeks; - cbc/bmp/ b12 levels-zarxio-Dr.B  All questions were  answered. The patient knows to call the clinic with any problems, questions or concerns.    Earna Coder, MD 09/05/2022 11:34 AM

## 2022-09-05 NOTE — Assessment & Plan Note (Signed)
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  # Patient's WBC- today-2.6 ; ANC- 0.9 proceed with zarxio today; and  continue weekly zarxio.  Patient clinically asymptomatic.  Continue cbc/zarxioweekly/pt pref. stable; continue prednisone 5 mg/day [re- fill]; continue at current dose.   # Recent COVID s/p Anti-viral resolved.   # Elevated BP [at home ~130]- stable.   # Hx of temporal arteritis->20 years on low dose prednisone-stable.    # Hx of B12 deficiency- July 2023- > 3400- recommend B12 PO three times week- stable.   # DISPOSITION: # proceed with ZARXIO today # weekly cbc/zaxio x 12 # follow up with MD in 12  Weeks; - cbc/bmp/ b12 levels-zarxio-Dr.B

## 2022-09-12 ENCOUNTER — Inpatient Hospital Stay: Payer: Medicare Other

## 2022-09-12 DIAGNOSIS — Z885 Allergy status to narcotic agent status: Secondary | ICD-10-CM | POA: Diagnosis not present

## 2022-09-12 DIAGNOSIS — Z9049 Acquired absence of other specified parts of digestive tract: Secondary | ICD-10-CM | POA: Diagnosis not present

## 2022-09-12 DIAGNOSIS — Z811 Family history of alcohol abuse and dependence: Secondary | ICD-10-CM | POA: Diagnosis not present

## 2022-09-12 DIAGNOSIS — D708 Other neutropenia: Secondary | ICD-10-CM | POA: Diagnosis not present

## 2022-09-12 DIAGNOSIS — Z87442 Personal history of urinary calculi: Secondary | ICD-10-CM | POA: Diagnosis not present

## 2022-09-12 DIAGNOSIS — M316 Other giant cell arteritis: Secondary | ICD-10-CM | POA: Diagnosis not present

## 2022-09-12 DIAGNOSIS — Z7952 Long term (current) use of systemic steroids: Secondary | ICD-10-CM | POA: Diagnosis not present

## 2022-09-12 DIAGNOSIS — Z8719 Personal history of other diseases of the digestive system: Secondary | ICD-10-CM | POA: Diagnosis not present

## 2022-09-12 DIAGNOSIS — Z800 Family history of malignant neoplasm of digestive organs: Secondary | ICD-10-CM | POA: Diagnosis not present

## 2022-09-12 DIAGNOSIS — Z85828 Personal history of other malignant neoplasm of skin: Secondary | ICD-10-CM | POA: Diagnosis not present

## 2022-09-12 DIAGNOSIS — R030 Elevated blood-pressure reading, without diagnosis of hypertension: Secondary | ICD-10-CM | POA: Diagnosis not present

## 2022-09-12 DIAGNOSIS — Z87891 Personal history of nicotine dependence: Secondary | ICD-10-CM | POA: Diagnosis not present

## 2022-09-12 DIAGNOSIS — Z8616 Personal history of COVID-19: Secondary | ICD-10-CM | POA: Diagnosis not present

## 2022-09-12 DIAGNOSIS — Z79899 Other long term (current) drug therapy: Secondary | ICD-10-CM | POA: Diagnosis not present

## 2022-09-12 DIAGNOSIS — E785 Hyperlipidemia, unspecified: Secondary | ICD-10-CM | POA: Diagnosis not present

## 2022-09-12 DIAGNOSIS — Z9013 Acquired absence of bilateral breasts and nipples: Secondary | ICD-10-CM | POA: Diagnosis not present

## 2022-09-12 LAB — CBC WITH DIFFERENTIAL/PLATELET
Abs Immature Granulocytes: 0.13 10*3/uL — ABNORMAL HIGH (ref 0.00–0.07)
Basophils Absolute: 0 10*3/uL (ref 0.0–0.1)
Basophils Relative: 1 %
Eosinophils Absolute: 0 10*3/uL (ref 0.0–0.5)
Eosinophils Relative: 1 %
HCT: 45.3 % (ref 36.0–46.0)
Hemoglobin: 14.1 g/dL (ref 12.0–15.0)
Immature Granulocytes: 4 %
Lymphocytes Relative: 28 %
Lymphs Abs: 1 10*3/uL (ref 0.7–4.0)
MCH: 28.7 pg (ref 26.0–34.0)
MCHC: 31.1 g/dL (ref 30.0–36.0)
MCV: 92.3 fL (ref 80.0–100.0)
Monocytes Absolute: 0.2 10*3/uL (ref 0.1–1.0)
Monocytes Relative: 6 %
Neutro Abs: 2.2 10*3/uL (ref 1.7–7.7)
Neutrophils Relative %: 60 %
Platelets: 300 10*3/uL (ref 150–400)
RBC: 4.91 MIL/uL (ref 3.87–5.11)
RDW: 15.1 % (ref 11.5–15.5)
Smear Review: NORMAL
WBC: 3.7 10*3/uL — ABNORMAL LOW (ref 4.0–10.5)
nRBC: 0 % (ref 0.0–0.2)

## 2022-09-12 NOTE — Progress Notes (Signed)
ANC 2.2 today. No injection needed

## 2022-09-19 ENCOUNTER — Inpatient Hospital Stay: Payer: Medicare Other

## 2022-09-19 DIAGNOSIS — Z7952 Long term (current) use of systemic steroids: Secondary | ICD-10-CM | POA: Diagnosis not present

## 2022-09-19 DIAGNOSIS — Z87891 Personal history of nicotine dependence: Secondary | ICD-10-CM | POA: Diagnosis not present

## 2022-09-19 DIAGNOSIS — Z8616 Personal history of COVID-19: Secondary | ICD-10-CM | POA: Diagnosis not present

## 2022-09-19 DIAGNOSIS — E785 Hyperlipidemia, unspecified: Secondary | ICD-10-CM | POA: Diagnosis not present

## 2022-09-19 DIAGNOSIS — D72819 Decreased white blood cell count, unspecified: Secondary | ICD-10-CM

## 2022-09-19 DIAGNOSIS — D708 Other neutropenia: Secondary | ICD-10-CM

## 2022-09-19 DIAGNOSIS — Z9013 Acquired absence of bilateral breasts and nipples: Secondary | ICD-10-CM | POA: Diagnosis not present

## 2022-09-19 DIAGNOSIS — Z8719 Personal history of other diseases of the digestive system: Secondary | ICD-10-CM | POA: Diagnosis not present

## 2022-09-19 DIAGNOSIS — Z9049 Acquired absence of other specified parts of digestive tract: Secondary | ICD-10-CM | POA: Diagnosis not present

## 2022-09-19 DIAGNOSIS — M316 Other giant cell arteritis: Secondary | ICD-10-CM | POA: Diagnosis not present

## 2022-09-19 DIAGNOSIS — Z79899 Other long term (current) drug therapy: Secondary | ICD-10-CM | POA: Diagnosis not present

## 2022-09-19 DIAGNOSIS — Z87442 Personal history of urinary calculi: Secondary | ICD-10-CM | POA: Diagnosis not present

## 2022-09-19 DIAGNOSIS — Z811 Family history of alcohol abuse and dependence: Secondary | ICD-10-CM | POA: Diagnosis not present

## 2022-09-19 DIAGNOSIS — Z800 Family history of malignant neoplasm of digestive organs: Secondary | ICD-10-CM | POA: Diagnosis not present

## 2022-09-19 DIAGNOSIS — R030 Elevated blood-pressure reading, without diagnosis of hypertension: Secondary | ICD-10-CM | POA: Diagnosis not present

## 2022-09-19 DIAGNOSIS — Z85828 Personal history of other malignant neoplasm of skin: Secondary | ICD-10-CM | POA: Diagnosis not present

## 2022-09-19 DIAGNOSIS — Z885 Allergy status to narcotic agent status: Secondary | ICD-10-CM | POA: Diagnosis not present

## 2022-09-19 LAB — CBC WITH DIFFERENTIAL/PLATELET
Abs Immature Granulocytes: 0.04 10*3/uL (ref 0.00–0.07)
Basophils Absolute: 0 10*3/uL (ref 0.0–0.1)
Basophils Relative: 2 %
Eosinophils Absolute: 0.1 10*3/uL (ref 0.0–0.5)
Eosinophils Relative: 2 %
HCT: 44.2 % (ref 36.0–46.0)
Hemoglobin: 13.5 g/dL (ref 12.0–15.0)
Immature Granulocytes: 2 %
Lymphocytes Relative: 47 %
Lymphs Abs: 1.3 10*3/uL (ref 0.7–4.0)
MCH: 28.5 pg (ref 26.0–34.0)
MCHC: 30.5 g/dL (ref 30.0–36.0)
MCV: 93.2 fL (ref 80.0–100.0)
Monocytes Absolute: 0.3 10*3/uL (ref 0.1–1.0)
Monocytes Relative: 10 %
Neutro Abs: 1 10*3/uL — ABNORMAL LOW (ref 1.7–7.7)
Neutrophils Relative %: 37 %
Platelets: 238 10*3/uL (ref 150–400)
RBC: 4.74 MIL/uL (ref 3.87–5.11)
RDW: 15.3 % (ref 11.5–15.5)
WBC: 2.6 10*3/uL — ABNORMAL LOW (ref 4.0–10.5)
nRBC: 0 % (ref 0.0–0.2)

## 2022-09-19 MED ORDER — FILGRASTIM-SNDZ 480 MCG/0.8ML IJ SOSY
480.0000 ug | PREFILLED_SYRINGE | Freq: Once | INTRAMUSCULAR | Status: AC
  Administered 2022-09-19: 480 ug via SUBCUTANEOUS
  Filled 2022-09-19: qty 0.8

## 2022-09-25 ENCOUNTER — Other Ambulatory Visit: Payer: Self-pay | Admitting: *Deleted

## 2022-09-25 ENCOUNTER — Encounter: Payer: Self-pay | Admitting: Internal Medicine

## 2022-09-25 DIAGNOSIS — D72819 Decreased white blood cell count, unspecified: Secondary | ICD-10-CM

## 2022-09-26 ENCOUNTER — Inpatient Hospital Stay: Payer: Medicare Other

## 2022-09-26 DIAGNOSIS — R030 Elevated blood-pressure reading, without diagnosis of hypertension: Secondary | ICD-10-CM | POA: Diagnosis not present

## 2022-09-26 DIAGNOSIS — Z9049 Acquired absence of other specified parts of digestive tract: Secondary | ICD-10-CM | POA: Diagnosis not present

## 2022-09-26 DIAGNOSIS — Z87442 Personal history of urinary calculi: Secondary | ICD-10-CM | POA: Diagnosis not present

## 2022-09-26 DIAGNOSIS — Z79899 Other long term (current) drug therapy: Secondary | ICD-10-CM | POA: Diagnosis not present

## 2022-09-26 DIAGNOSIS — D708 Other neutropenia: Secondary | ICD-10-CM

## 2022-09-26 DIAGNOSIS — Z885 Allergy status to narcotic agent status: Secondary | ICD-10-CM | POA: Diagnosis not present

## 2022-09-26 DIAGNOSIS — Z85828 Personal history of other malignant neoplasm of skin: Secondary | ICD-10-CM | POA: Diagnosis not present

## 2022-09-26 DIAGNOSIS — D72819 Decreased white blood cell count, unspecified: Secondary | ICD-10-CM

## 2022-09-26 DIAGNOSIS — Z87891 Personal history of nicotine dependence: Secondary | ICD-10-CM | POA: Diagnosis not present

## 2022-09-26 DIAGNOSIS — Z800 Family history of malignant neoplasm of digestive organs: Secondary | ICD-10-CM | POA: Diagnosis not present

## 2022-09-26 DIAGNOSIS — Z8719 Personal history of other diseases of the digestive system: Secondary | ICD-10-CM | POA: Diagnosis not present

## 2022-09-26 DIAGNOSIS — Z7952 Long term (current) use of systemic steroids: Secondary | ICD-10-CM | POA: Diagnosis not present

## 2022-09-26 DIAGNOSIS — Z9013 Acquired absence of bilateral breasts and nipples: Secondary | ICD-10-CM | POA: Diagnosis not present

## 2022-09-26 DIAGNOSIS — E785 Hyperlipidemia, unspecified: Secondary | ICD-10-CM | POA: Diagnosis not present

## 2022-09-26 DIAGNOSIS — Z8616 Personal history of COVID-19: Secondary | ICD-10-CM | POA: Diagnosis not present

## 2022-09-26 DIAGNOSIS — Z811 Family history of alcohol abuse and dependence: Secondary | ICD-10-CM | POA: Diagnosis not present

## 2022-09-26 DIAGNOSIS — M316 Other giant cell arteritis: Secondary | ICD-10-CM | POA: Diagnosis not present

## 2022-09-26 LAB — CBC WITH DIFFERENTIAL (CANCER CENTER ONLY)
Abs Immature Granulocytes: 0.15 10*3/uL — ABNORMAL HIGH (ref 0.00–0.07)
Basophils Absolute: 0 10*3/uL (ref 0.0–0.1)
Basophils Relative: 1 %
Eosinophils Absolute: 0.1 10*3/uL (ref 0.0–0.5)
Eosinophils Relative: 3 %
HCT: 45.3 % (ref 36.0–46.0)
Hemoglobin: 14.1 g/dL (ref 12.0–15.0)
Immature Granulocytes: 5 %
Lymphocytes Relative: 39 %
Lymphs Abs: 1.2 10*3/uL (ref 0.7–4.0)
MCH: 28.8 pg (ref 26.0–34.0)
MCHC: 31.1 g/dL (ref 30.0–36.0)
MCV: 92.4 fL (ref 80.0–100.0)
Monocytes Absolute: 0.3 10*3/uL (ref 0.1–1.0)
Monocytes Relative: 10 %
Neutro Abs: 1.3 10*3/uL — ABNORMAL LOW (ref 1.7–7.7)
Neutrophils Relative %: 42 %
Platelet Count: 211 10*3/uL (ref 150–400)
RBC: 4.9 MIL/uL (ref 3.87–5.11)
RDW: 15.7 % — ABNORMAL HIGH (ref 11.5–15.5)
WBC Count: 3 10*3/uL — ABNORMAL LOW (ref 4.0–10.5)
nRBC: 0 % (ref 0.0–0.2)

## 2022-09-26 MED ORDER — FILGRASTIM-SNDZ 480 MCG/0.8ML IJ SOSY
480.0000 ug | PREFILLED_SYRINGE | Freq: Once | INTRAMUSCULAR | Status: AC
  Administered 2022-09-26: 480 ug via SUBCUTANEOUS
  Filled 2022-09-26: qty 0.8

## 2022-09-27 ENCOUNTER — Ambulatory Visit (INDEPENDENT_AMBULATORY_CARE_PROVIDER_SITE_OTHER): Payer: Medicare Other | Admitting: Nurse Practitioner

## 2022-09-27 ENCOUNTER — Ambulatory Visit (INDEPENDENT_AMBULATORY_CARE_PROVIDER_SITE_OTHER): Payer: Medicare Other

## 2022-09-27 ENCOUNTER — Encounter (INDEPENDENT_AMBULATORY_CARE_PROVIDER_SITE_OTHER): Payer: Self-pay | Admitting: Nurse Practitioner

## 2022-09-27 VITALS — BP 148/76 | HR 93 | Resp 18 | Ht 65.0 in | Wt 150.0 lb

## 2022-09-27 DIAGNOSIS — D485 Neoplasm of uncertain behavior of skin: Secondary | ICD-10-CM | POA: Diagnosis not present

## 2022-09-27 DIAGNOSIS — Z85828 Personal history of other malignant neoplasm of skin: Secondary | ICD-10-CM | POA: Diagnosis not present

## 2022-09-27 DIAGNOSIS — D2261 Melanocytic nevi of right upper limb, including shoulder: Secondary | ICD-10-CM | POA: Diagnosis not present

## 2022-09-27 DIAGNOSIS — I739 Peripheral vascular disease, unspecified: Secondary | ICD-10-CM | POA: Diagnosis not present

## 2022-09-27 DIAGNOSIS — D045 Carcinoma in situ of skin of trunk: Secondary | ICD-10-CM | POA: Diagnosis not present

## 2022-09-27 DIAGNOSIS — I70212 Atherosclerosis of native arteries of extremities with intermittent claudication, left leg: Secondary | ICD-10-CM

## 2022-09-27 DIAGNOSIS — I872 Venous insufficiency (chronic) (peripheral): Secondary | ICD-10-CM

## 2022-09-27 DIAGNOSIS — L570 Actinic keratosis: Secondary | ICD-10-CM | POA: Diagnosis not present

## 2022-09-27 DIAGNOSIS — D2271 Melanocytic nevi of right lower limb, including hip: Secondary | ICD-10-CM | POA: Diagnosis not present

## 2022-09-27 DIAGNOSIS — D2272 Melanocytic nevi of left lower limb, including hip: Secondary | ICD-10-CM | POA: Diagnosis not present

## 2022-09-27 NOTE — Progress Notes (Signed)
Subjective:    Patient ID: [name], female    DOB: April 19, 1935, 88 y.o.   MRN: [id number] Chief Complaint  Patient presents with   Follow-up    fu in 1 year with ab    [name] is a 88 year old female who returns today for follow-up evaluation and concerns due to swelling and numbness in her lower extremity.  The patient does have known chronic venous insufficiency and has had swelling for years.  She notes that over the last week her swelling seems to be worse but it has recently shown some improvement.  The patient notes that she has medical grade compression socks but she has had the same pair for at least 5 years.  She also notes that these are only crew sock length.  She also does not wear them on a consistent basis.  The patient does elevate her legs when possible.  She notes that the swelling worsened last week and then she also began to have some numbness in her left leg.  She notes that the numbness only happen in bed and it improved when she got up and walk some.  She notes that this is only happened a couple of times and she does not have any other pain in her legs including claudication-like symptoms.  She denies any open wounds or ulcerations.  Today the patient has an ABI 0.90 on the right and 0.68 on the left.  Previously the patient had an ABI of 1.04 on the right and 1.08 on the left.  The patient has strong biphasic tibial artery waveforms bilaterally with only slightly dampened toe waveforms bilaterally.    Review of Systems  Cardiovascular:  Positive for leg swelling.  All other systems reviewed and are negative.      Objective:   Physical Exam Vitals reviewed.  HENT:     Head: Normocephalic.  Cardiovascular:     Rate and Rhythm: Normal rate.     Pulses:          Dorsalis pedis pulses are detected w/ Doppler on the right side and detected w/ Doppler on the left side.       Posterior tibial pulses are detected w/ Doppler on the right side and  detected w/ Doppler on the left side.  Pulmonary:     Effort: Pulmonary effort is normal.  Musculoskeletal:     Right lower leg: 1+ Edema present.     Left lower leg: 1+ Edema present.  Skin:    General: Skin is warm and dry.     Comments: Stasis dermatitis noted bilaterally  Neurological:     Mental Status: She is alert and oriented to person, place, and time.  Psychiatric:        Mood and Affect: Mood normal.        Behavior: Behavior normal.        Thought Content: Thought content normal.        Judgment: Judgment normal.     BP (!) 148/76 (BP Location: Left Arm)   Pulse 93   Resp 18   Ht 5\' 5"  (1.651 m)   Wt 150 lb (68 kg)   BMI 24.96 kg/m   Past Medical History:  Diagnosis Date   Breast cancer (HCC) 1985   bilateral mastectomies   Depression    1 time specific situation that she did not know how to deal wiht   Diverticulosis    Hiatal hernia    History of Bell's palsy  History of colon polyps    History of COVID-19    History of nephrolithiasis    History of pancreatitis    Hyperlipidemia    IBS (irritable bowel syndrome)    Osteoporosis    osteopenia in neck   Skin cancer    Temporal arteritis (HCC)     Social History   Socioeconomic History   Marital status: Widowed    Spouse name: Not on file   Number of children: 2   Years of education: college   Highest education level: Not on file  Occupational History   Occupation: Public relations account executive   Occupation: Retired  Tobacco Use   Smoking status: Former    Packs/day: 0.25    Years: 30.00    Additional pack years: 0.00    Total pack years: 7.50    Types: Cigarettes    Quit date: 05/17/2005    Years since quitting: 17.3   Smokeless tobacco: Never   Tobacco comments:    quit 11 years ago  Vaping Use   Vaping Use: Never used  Substance and Sexual Activity   Alcohol use: No    Alcohol/week: 0.0 standard drinks of alcohol   Drug use: No   Sexual activity: Not Currently  Other Topics Concern   Not  on file  Social History Narrative   Patient lives at home alone.    Patient is retired.    Patient has some college.    Patient has 2 children.   Social Determinants of Health   Financial Resource Strain: Not on file  Food Insecurity: Not on file  Transportation Needs: Not on file  Physical Activity: Not on file  Stress: Not on file  Social Connections: Not on file  Intimate Partner Violence: Not on file    Past Surgical History:  Procedure Laterality Date   AUGMENTATION MAMMAPLASTY Bilateral 2000   COLON RESECTION SIGMOID N/A 10/13/2017   Procedure: COLON RESECTION SIGMOID;  Surgeon: Carolan Shiver, MD;  Location: [facility] ORS;  Service: General;  Laterality: N/A;   COLOSTOMY N/A 10/13/2017   Procedure: COLOSTOMY;  Surgeon: Carolan Shiver, MD;  Location: [facility] ORS;  Service: General;  Laterality: N/A;   LAPAROSCOPIC CHOLECYSTECTOMY  2009   MASTECTOMY  1983   bilateral    Family History  Problem Relation Age of Onset   Colon cancer Mother 60   Scleroderma Father 62   Alcoholism Brother    Stomach cancer Neg Hx     Allergies  Allergen Reactions   Morphine Other (See Comments)    "done not work, makes me scream."   Benzalkonium Chloride Dermatitis    Other reaction(s): Unknown   Escitalopram     Deveioped a twitch   Hydralazine     Causes sx of heart pounding   Neomycin-Bacitracin Zn-Polymyx Dermatitis    Other reaction(s): Other (See Comments) REACTION: blisters skin REACTION: blisters skin REACTION: blisters skin   Bacitracin-Neomycin-Polymyxin Dermatitis    REACTION: blisters skin       Latest Ref Rng & Units 09/26/2022   10:12 AM 09/19/2022   10:06 AM 09/12/2022    9:54 AM  CBC  WBC 4.0 - 10.5 K/uL 3.0  2.6  3.7   Hemoglobin 12.0 - 15.0 g/dL 16.1  09.6  04.5   Hematocrit 36.0 - 46.0 % 45.3  44.2  45.3   Platelets 150 - 400 K/uL 211  238  300       CMP     Component Value Date/Time   NA 139  09/05/2022 1005   NA 140 08/12/2012 0430   K  3.2 (L) 09/05/2022 1005   K 3.9 08/12/2012 0430   CL 103 09/05/2022 1005   CL 107 08/12/2012 0430   CO2 27 09/05/2022 1005   CO2 28 08/12/2012 0430   GLUCOSE 106 (H) 09/05/2022 1005   GLUCOSE 80 08/12/2012 0430   BUN 18 09/05/2022 1005   BUN 11 08/12/2012 0430   CREATININE 1.09 (H) 09/05/2022 1005   CREATININE 0.78 08/12/2012 0430   CALCIUM 8.9 09/05/2022 1005   CALCIUM 7.9 (L) 08/12/2012 0430   PROT 7.3 01/17/2022 1133   PROT 7.4 08/11/2012 1117   ALBUMIN 4.2 01/17/2022 1133   ALBUMIN 3.7 08/11/2012 1117   AST 27 01/17/2022 1133   AST 26 08/11/2012 1117   ALT 18 01/17/2022 1133   ALT 20 08/11/2012 1117   ALKPHOS 97 01/17/2022 1133   ALKPHOS 109 08/11/2012 1117   BILITOT 1.1 01/17/2022 1133   BILITOT 0.7 08/11/2012 1117   GFRNONAA 49 (L) 09/05/2022 1005   GFRNONAA >60 08/12/2012 0430   GFRAA 58 (L) 02/17/2020 0955   GFRAA >60 08/12/2012 0430     No results found.     Assessment & Plan:   1. PAD (peripheral artery disease) (HCC) Following discussion with the patient her numbness is only occasional in bed.  I suspect this may be more musculoskeletal related than true rest pain as typically rest pain will typically be consistent and associated with other symptoms such as claudication.  While the patient studies today may appear reduced in her left lower extremity compared to previous studies, they are consistent with the studies that she had done previously in 2018 and 2017 and she has not had any intervention to cause any improvement of these.  Based on this I suspect that the studies done in 2023 were an outlier and possibly falsely elevated due to medial calcification.  Currently the patient does not have any ischemic or limb threatening symptoms and so we will continue to maintain and monitor closely and discuss her symptoms at her 97-month follow-up.  2. Chronic venous insufficiency I had a long discussion with the patient regarding chronic venous insufficiency.  We  discussed the importance of conservative therapy including use of medical grade compression stockings.  We advised the use of knee-high compression stockings which should be worn daily.  She should also elevate her lower extremities as well as attempt to exercise 15 to 20 minutes daily.  We also discussed the use of diuretics as a relates to chronic venous insufficiency.  Typically chronic venous insufficiency is not largely responsive to diuretics.  We discussed that in the absence of any other conditions chronic venous insufficiency can actually cause damage or issues with the kidneys.  She noted that there was some concern for possible heart failure by one of her family members in addition to some very discolored urine.  I have advised the patient to follow-up with her PCP for evaluation for any other conditions that may be worsening her lower extremity edema.     Current Outpatient Medications on File Prior to Visit  Medication Sig Dispense Refill   ALPRAZolam (XANAX) 0.25 MG tablet Take 0.25 mg by mouth 2 (two) times daily as needed.      amLODipine (NORVASC) 5 MG tablet Take 5 mg by mouth daily. Pt states that she she increased the tablet to 5 mg daily. She was taking 2.5 mg. She stated that she thought the half tablet was  not working efficiently. I asked the patient to inform her pcp that her dosage was increased.     Ascorbic Acid (VITAMIN C) 1000 MG tablet Take 1,000 mg by mouth daily.     aspirin EC 81 MG tablet Take 81 mg daily by mouth.     b complex vitamins capsule Take 1 capsule by mouth daily.     Biotin 1000 MCG tablet Take 1,000 mcg by mouth daily.      Cholecalciferol (VITAMIN D3) 2000 units capsule Take 2,000 Units by mouth daily.     Cyanocobalamin (B-12 PO) Take 2,500 mcg by mouth daily.     linaclotide (LINZESS) 145 MCG CAPS capsule Take 1 capsule (145 mcg total) by mouth daily. 30 capsule 2   predniSONE (DELTASONE) 5 MG tablet Take 5 mg by mouth daily.     traMADol (ULTRAM)  50 MG tablet Take 50 mg by mouth daily.     ZINC OXIDE PO Take 1 tablet by mouth daily.     alendronate (FOSAMAX) 70 MG tablet Take 70 mg by mouth once a week. (Patient not taking: Reported on 06/09/2022)     filgrastim-sndz (ZARXIO) 480 MCG/0.8ML SOSY injection Inject 0.8 mLs (480 mcg total) into the skin once a week. Administer injection subcutaneously (Patient not taking: Reported on 09/27/2022) 3.2 mL 6   No current facility-administered medications on file prior to visit.    There are no Patient Instructions on file for this visit. No follow-ups on file.   Georgiana Spinner, NP

## 2022-09-28 DIAGNOSIS — R3989 Other symptoms and signs involving the genitourinary system: Secondary | ICD-10-CM | POA: Diagnosis not present

## 2022-09-28 DIAGNOSIS — R8290 Unspecified abnormal findings in urine: Secondary | ICD-10-CM | POA: Diagnosis not present

## 2022-09-28 LAB — VAS US ABI WITH/WO TBI
Left ABI: 0.68
Right ABI: 0.9

## 2022-09-29 ENCOUNTER — Other Ambulatory Visit: Payer: Self-pay | Admitting: *Deleted

## 2022-09-29 DIAGNOSIS — D72819 Decreased white blood cell count, unspecified: Secondary | ICD-10-CM

## 2022-09-29 DIAGNOSIS — D708 Other neutropenia: Secondary | ICD-10-CM

## 2022-10-03 ENCOUNTER — Inpatient Hospital Stay: Payer: Medicare Other | Attending: Internal Medicine

## 2022-10-03 ENCOUNTER — Inpatient Hospital Stay: Payer: Medicare Other

## 2022-10-03 DIAGNOSIS — Z811 Family history of alcohol abuse and dependence: Secondary | ICD-10-CM | POA: Insufficient documentation

## 2022-10-03 DIAGNOSIS — F419 Anxiety disorder, unspecified: Secondary | ICD-10-CM | POA: Diagnosis not present

## 2022-10-03 DIAGNOSIS — D72819 Decreased white blood cell count, unspecified: Secondary | ICD-10-CM

## 2022-10-03 DIAGNOSIS — D708 Other neutropenia: Secondary | ICD-10-CM

## 2022-10-03 DIAGNOSIS — Z79899 Other long term (current) drug therapy: Secondary | ICD-10-CM | POA: Insufficient documentation

## 2022-10-03 DIAGNOSIS — Z840 Family history of diseases of the skin and subcutaneous tissue: Secondary | ICD-10-CM | POA: Insufficient documentation

## 2022-10-03 DIAGNOSIS — R202 Paresthesia of skin: Secondary | ICD-10-CM | POA: Insufficient documentation

## 2022-10-03 DIAGNOSIS — Z800 Family history of malignant neoplasm of digestive organs: Secondary | ICD-10-CM | POA: Insufficient documentation

## 2022-10-03 DIAGNOSIS — Z87891 Personal history of nicotine dependence: Secondary | ICD-10-CM | POA: Diagnosis not present

## 2022-10-03 LAB — CBC WITH DIFFERENTIAL (CANCER CENTER ONLY)
Abs Immature Granulocytes: 0.02 10*3/uL (ref 0.00–0.07)
Basophils Absolute: 0 10*3/uL (ref 0.0–0.1)
Basophils Relative: 1 %
Eosinophils Absolute: 0.1 10*3/uL (ref 0.0–0.5)
Eosinophils Relative: 3 %
HCT: 43.1 % (ref 36.0–46.0)
Hemoglobin: 13.4 g/dL (ref 12.0–15.0)
Immature Granulocytes: 1 %
Lymphocytes Relative: 53 %
Lymphs Abs: 1.5 10*3/uL (ref 0.7–4.0)
MCH: 28.9 pg (ref 26.0–34.0)
MCHC: 31.1 g/dL (ref 30.0–36.0)
MCV: 93.1 fL (ref 80.0–100.0)
Monocytes Absolute: 0.3 10*3/uL (ref 0.1–1.0)
Monocytes Relative: 13 %
Neutro Abs: 0.8 10*3/uL — ABNORMAL LOW (ref 1.7–7.7)
Neutrophils Relative %: 29 %
Platelet Count: 200 10*3/uL (ref 150–400)
RBC: 4.63 MIL/uL (ref 3.87–5.11)
RDW: 15.9 % — ABNORMAL HIGH (ref 11.5–15.5)
Smear Review: NORMAL
WBC Count: 2.7 10*3/uL — ABNORMAL LOW (ref 4.0–10.5)
nRBC: 0 % (ref 0.0–0.2)

## 2022-10-03 MED ORDER — FILGRASTIM-SNDZ 480 MCG/0.8ML IJ SOSY
480.0000 ug | PREFILLED_SYRINGE | Freq: Once | INTRAMUSCULAR | Status: AC
  Administered 2022-10-03: 480 ug via SUBCUTANEOUS
  Filled 2022-10-03: qty 0.8

## 2022-10-09 ENCOUNTER — Other Ambulatory Visit: Payer: Self-pay

## 2022-10-09 ENCOUNTER — Inpatient Hospital Stay
Admission: EM | Admit: 2022-10-09 | Discharge: 2022-10-11 | DRG: 440 | Disposition: A | Discharge status: Home / Self Care | Payer: Medicare Other | Attending: Osteopathic Medicine | Admitting: Osteopathic Medicine

## 2022-10-09 ENCOUNTER — Emergency Department: Payer: Medicare Other

## 2022-10-09 DIAGNOSIS — Z9049 Acquired absence of other specified parts of digestive tract: Secondary | ICD-10-CM | POA: Diagnosis not present

## 2022-10-09 DIAGNOSIS — R1084 Generalized abdominal pain: Principal | ICD-10-CM

## 2022-10-09 DIAGNOSIS — D708 Other neutropenia: Secondary | ICD-10-CM | POA: Diagnosis present

## 2022-10-09 DIAGNOSIS — Z883 Allergy status to other anti-infective agents status: Secondary | ICD-10-CM | POA: Diagnosis not present

## 2022-10-09 DIAGNOSIS — Z7982 Long term (current) use of aspirin: Secondary | ICD-10-CM | POA: Diagnosis not present

## 2022-10-09 DIAGNOSIS — K5900 Constipation, unspecified: Secondary | ICD-10-CM | POA: Diagnosis present

## 2022-10-09 DIAGNOSIS — M316 Other giant cell arteritis: Secondary | ICD-10-CM | POA: Diagnosis not present

## 2022-10-09 DIAGNOSIS — I10 Essential (primary) hypertension: Secondary | ICD-10-CM | POA: Diagnosis not present

## 2022-10-09 DIAGNOSIS — I959 Hypotension, unspecified: Secondary | ICD-10-CM | POA: Diagnosis present

## 2022-10-09 DIAGNOSIS — Z8616 Personal history of COVID-19: Secondary | ICD-10-CM | POA: Diagnosis not present

## 2022-10-09 DIAGNOSIS — R9431 Abnormal electrocardiogram [ECG] [EKG]: Secondary | ICD-10-CM | POA: Diagnosis not present

## 2022-10-09 DIAGNOSIS — Z885 Allergy status to narcotic agent status: Secondary | ICD-10-CM

## 2022-10-09 DIAGNOSIS — K859 Acute pancreatitis without necrosis or infection, unspecified: Secondary | ICD-10-CM | POA: Diagnosis not present

## 2022-10-09 DIAGNOSIS — Z85828 Personal history of other malignant neoplasm of skin: Secondary | ICD-10-CM | POA: Diagnosis not present

## 2022-10-09 DIAGNOSIS — Z888 Allergy status to other drugs, medicaments and biological substances status: Secondary | ICD-10-CM | POA: Diagnosis not present

## 2022-10-09 DIAGNOSIS — Z87891 Personal history of nicotine dependence: Secondary | ICD-10-CM

## 2022-10-09 DIAGNOSIS — Z7983 Long term (current) use of bisphosphonates: Secondary | ICD-10-CM

## 2022-10-09 DIAGNOSIS — Z9013 Acquired absence of bilateral breasts and nipples: Secondary | ICD-10-CM

## 2022-10-09 DIAGNOSIS — K589 Irritable bowel syndrome without diarrhea: Secondary | ICD-10-CM | POA: Diagnosis present

## 2022-10-09 DIAGNOSIS — K449 Diaphragmatic hernia without obstruction or gangrene: Secondary | ICD-10-CM | POA: Diagnosis not present

## 2022-10-09 DIAGNOSIS — Z853 Personal history of malignant neoplasm of breast: Secondary | ICD-10-CM

## 2022-10-09 DIAGNOSIS — K219 Gastro-esophageal reflux disease without esophagitis: Secondary | ICD-10-CM | POA: Diagnosis present

## 2022-10-09 DIAGNOSIS — Z79899 Other long term (current) drug therapy: Secondary | ICD-10-CM

## 2022-10-09 DIAGNOSIS — R55 Syncope and collapse: Secondary | ICD-10-CM | POA: Diagnosis not present

## 2022-10-09 DIAGNOSIS — I700 Atherosclerosis of aorta: Secondary | ICD-10-CM | POA: Diagnosis not present

## 2022-10-09 DIAGNOSIS — E785 Hyperlipidemia, unspecified: Secondary | ICD-10-CM | POA: Diagnosis present

## 2022-10-09 DIAGNOSIS — M810 Age-related osteoporosis without current pathological fracture: Secondary | ICD-10-CM | POA: Diagnosis not present

## 2022-10-09 DIAGNOSIS — N200 Calculus of kidney: Secondary | ICD-10-CM | POA: Diagnosis not present

## 2022-10-09 DIAGNOSIS — Z7952 Long term (current) use of systemic steroids: Secondary | ICD-10-CM

## 2022-10-09 LAB — COMPREHENSIVE METABOLIC PANEL
ALT: 15 U/L (ref 0–44)
AST: 22 U/L (ref 15–41)
Albumin: 4 g/dL (ref 3.5–5.0)
Alkaline Phosphatase: 95 U/L (ref 38–126)
Anion gap: 11 (ref 5–15)
BUN: 27 mg/dL — ABNORMAL HIGH (ref 8–23)
CO2: 25 mmol/L (ref 22–32)
Calcium: 8.5 mg/dL — ABNORMAL LOW (ref 8.9–10.3)
Chloride: 105 mmol/L (ref 98–111)
Creatinine, Ser: 1.06 mg/dL — ABNORMAL HIGH (ref 0.44–1.00)
GFR, Estimated: 51 mL/min — ABNORMAL LOW (ref >60)
Glucose, Bld: 103 mg/dL — ABNORMAL HIGH (ref 70–99)
Potassium: 3.8 mmol/L (ref 3.5–5.1)
Sodium: 141 mmol/L (ref 135–145)
Total Bilirubin: 1 mg/dL (ref 0.3–1.2)
Total Protein: 6.6 g/dL (ref 6.5–8.1)

## 2022-10-09 LAB — URINALYSIS, ROUTINE W REFLEX MICROSCOPIC
Bilirubin Urine: NEGATIVE
Glucose, UA: NEGATIVE mg/dL
Hgb urine dipstick: NEGATIVE
Ketones, ur: NEGATIVE mg/dL
Leukocytes,Ua: NEGATIVE
Nitrite: NEGATIVE
Protein, ur: NEGATIVE mg/dL
Specific Gravity, Urine: 1.015 (ref 1.005–1.030)
pH: 7.5 (ref 5.0–8.0)

## 2022-10-09 LAB — CBC
HCT: 45.4 % (ref 36.0–46.0)
Hemoglobin: 14.3 g/dL (ref 12.0–15.0)
MCH: 29 pg (ref 26.0–34.0)
MCHC: 31.5 g/dL (ref 30.0–36.0)
MCV: 92.1 fL (ref 80.0–100.0)
Platelets: 251 10*3/uL (ref 150–400)
RBC: 4.93 MIL/uL (ref 3.87–5.11)
RDW: 15.9 % — ABNORMAL HIGH (ref 11.5–15.5)
WBC: 3.8 10*3/uL — ABNORMAL LOW (ref 4.0–10.5)
nRBC: 0 % (ref 0.0–0.2)

## 2022-10-09 LAB — TRIGLYCERIDES: Triglycerides: 43 mg/dL (ref <150)

## 2022-10-09 LAB — LIPASE, BLOOD: Lipase: 280 U/L — ABNORMAL HIGH (ref 11–51)

## 2022-10-09 MED ORDER — ONDANSETRON HCL 4 MG PO TABS: 4.0000 mg | ORAL_TABLET | Freq: Four times a day (QID) | ORAL | Status: DC | PRN

## 2022-10-09 MED ORDER — ONDANSETRON HCL 4 MG/2ML IJ SOLN
4.0000 mg | Freq: Once | INTRAMUSCULAR | Status: AC
  Administered 2022-10-09: 4 mg via INTRAVENOUS
  Filled 2022-10-09: qty 2

## 2022-10-09 MED ORDER — ENOXAPARIN SODIUM 40 MG/0.4ML IJ SOSY
40.0000 mg | PREFILLED_SYRINGE | INTRAMUSCULAR | Status: DC
  Administered 2022-10-09 – 2022-10-11 (×3): 40 mg via SUBCUTANEOUS
  Filled 2022-10-09 (×3): qty 0.4

## 2022-10-09 MED ORDER — SODIUM CHLORIDE 0.9 % IV SOLN: INTRAVENOUS | Status: DC

## 2022-10-09 MED ORDER — IOHEXOL 300 MG/ML  SOLN
100.0000 mL | Freq: Once | INTRAMUSCULAR | Status: AC | PRN
  Administered 2022-10-09: 100 mL via INTRAVENOUS

## 2022-10-09 MED ORDER — ONDANSETRON HCL 4 MG/2ML IJ SOLN: 4.0000 mg | Freq: Four times a day (QID) | INTRAMUSCULAR | Status: DC | PRN

## 2022-10-09 MED ORDER — LACTATED RINGERS IV BOLUS
1000.0000 mL | Freq: Once | INTRAVENOUS | Status: AC
  Administered 2022-10-09: 1000 mL via INTRAVENOUS

## 2022-10-09 MED ORDER — HYDROMORPHONE HCL 1 MG/ML IJ SOLN: 0.5000 mg | INTRAMUSCULAR | Status: DC | PRN

## 2022-10-09 NOTE — Assessment & Plan Note (Signed)
Followed by Dr. Virl Son with hematology oncology On zarxio WBC 3.8 which is above baseline  Follow

## 2022-10-09 NOTE — ED Notes (Signed)
Pt ambulatory to and from bathroom with x1 standby assist. Pt back into bed and reconnected to monitoring. Pt watching tv at this time. VSS, NAD. Call light in reach, WCTM.

## 2022-10-09 NOTE — H&P (Addendum)
History and Physical    Patient: [name] ZOX:[id number] DOB: Dec 11, 1934 DOA: 10/09/2022 DOS: the patient was seen and examined on 10/09/2022 PCP: Gracelyn Nurse, MD  Patient coming from: Home  Chief Complaint:  Chief Complaint  Patient presents with   Abdominal Pain   HPI: [name] is a 88 y.o. 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  Patient reports eating a very high fatty/rich meal yesterday at a buffet.  Patient with fibrofatty had a large amount of food including prime rib, steak, salmon, multiple pieces of cake.  Patient states she had developed significant abdominal pain and decreased GI movement over the course of the night.  Noted prior history of pancreatitis in the past.  Had a cholecystectomy associated pancreatitis approximately 5 to 6 years ago.  No reported alcohol or tobacco use.  No fevers or chills.  Positive decreased p.o. intake.  No hemiparesis or confusion.  Decreased bowel movements. Presented to the ER afebrile, hemodynamically stable.  White count 3.8, hemoglobin 14.3, platelets 251, urinalysis within normal limits, lipase 280, creatinine 1.06.  CT abdomen pelvis with inflammatory changes adjacent to the distal body and tail of the pancreas concerning for acute pancreatitis. Review of Systems: As mentioned in the history of present illness. All other systems reviewed and are negative. Past Medical History:  Diagnosis Date   Breast cancer [facility]) 1985   bilateral mastectomies   Depression    1 time specific situation that she did not know how to deal wiht   Diverticulosis    Hiatal hernia    History of Bell's palsy    History of colon polyps    History of COVID-19    History of nephrolithiasis    History of pancreatitis    Hyperlipidemia    IBS (irritable bowel syndrome)    Osteoporosis    osteopenia in neck    Skin cancer    Temporal arteritis (HCC)    Past Surgical History:  Procedure Laterality Date   AUGMENTATION MAMMAPLASTY Bilateral 2000   COLON RESECTION SIGMOID N/A 10/13/2017   Procedure: COLON RESECTION SIGMOID;  Surgeon: Carolan Shiver, MD;  Location: [facility] ORS;  Service: General;  Laterality: N/A;   COLOSTOMY N/A 10/13/2017   Procedure: COLOSTOMY;  Surgeon: Carolan Shiver, MD;  Location: [facility] ORS;  Service: General;  Laterality: N/A;   LAPAROSCOPIC CHOLECYSTECTOMY  2009   MASTECTOMY  1983   bilateral   Social History:  reports that she quit smoking about 17 years ago. Her smoking use included cigarettes. She has a 7.50 pack-year smoking history. She has never used smokeless tobacco. She reports that she does not drink alcohol and does not use drugs.  Allergies  Allergen Reactions   Morphine Other (See Comments)    "done not work, makes me scream."   Benzalkonium Chloride Dermatitis    Other reaction(s): Unknown   Escitalopram     Deveioped a twitch   Hydralazine     Causes sx of heart pounding   Neomycin-Bacitracin Zn-Polymyx Dermatitis    Other reaction(s): Other (See Comments) REACTION: blisters skin REACTION: blisters skin REACTION: blisters skin   Bacitracin-Neomycin-Polymyxin Dermatitis    REACTION: blisters skin    Family History  Problem Relation Age of Onset   Colon cancer Mother 72   Scleroderma Father 31   Alcoholism Brother    Stomach cancer Neg Hx     Prior to Admission medications   Medication  Sig Start Date End Date Taking? Authorizing Provider  alendronate (FOSAMAX) 70 MG tablet Take 70 mg by mouth once a week. Patient not taking: Reported on 06/09/2022 06/08/20   [provider]  ALPRAZolam Prudy Feeler) 0.25 MG tablet Take 0.25 mg by mouth 2 (two) times daily as needed.  11/03/10   [provider]  amLODipine (NORVASC) 5 MG tablet Take 5 mg by mouth daily. Pt states that she she increased the tablet to 5 mg daily. She was  taking 2.5 mg. She stated that she thought the half tablet was not working efficiently. I asked the patient to inform her pcp that her dosage was increased. 09/25/19   [provider]  Ascorbic Acid (VITAMIN C) 1000 MG tablet Take 1,000 mg by mouth daily.    [provider]  aspirin EC 81 MG tablet Take 81 mg daily by mouth.    [provider]  b complex vitamins capsule Take 1 capsule by mouth daily.    [provider]  Biotin 1000 MCG tablet Take 1,000 mcg by mouth daily.     [provider]  Cholecalciferol (VITAMIN D3) 2000 units capsule Take 2,000 Units by mouth daily.    [provider]  Cyanocobalamin (B-12 PO) Take 2,500 mcg by mouth daily.    [provider]  filgrastim-sndz (ZARXIO) 480 MCG/0.8ML SOSY injection Inject 0.8 mLs (480 mcg total) into the skin once a week. Administer injection subcutaneously Patient not taking: Reported on 09/27/2022 06/25/19   Earna Coder, MD  linaclotide [facility]) 145 MCG CAPS capsule Take 1 capsule (145 mcg total) by mouth daily. 06/26/22   Wyline Mood, MD  predniSONE (DELTASONE) 5 MG tablet Take 5 mg by mouth daily.    [provider]  traMADol (ULTRAM) 50 MG tablet Take 50 mg by mouth daily. 11/21/10   [provider]  ZINC OXIDE PO Take 1 tablet by mouth daily.    [provider]    Physical Exam: Vitals:   10/09/22 0602 10/09/22 0604 10/09/22 0615 10/09/22 0630  BP:   (!) 158/49 (!) 143/47  Pulse:   77 72  Resp:   14 17  Temp:      TempSrc:      SpO2:   94% 97%  Weight:  74.8 kg    Height: 5\' 5"  (1.651 m)      Physical Exam Constitutional:      Appearance: She is normal weight.  HENT:     Head: Normocephalic and atraumatic.     Nose: Nose normal.     Mouth/Throat:     Mouth: Mucous membranes are moist.  Eyes:     Pupils: Pupils are equal, round, and reactive to light.  Cardiovascular:     Rate and Rhythm: Normal rate and regular rhythm.   Pulmonary:     Effort: Pulmonary effort is normal.  Abdominal:     General: Bowel sounds are normal.     Comments: Mild generalized abd pain   Musculoskeletal:        General: Normal range of motion.  Skin:    General: Skin is warm.  Neurological:     General: No focal deficit present.  Psychiatric:        Mood and Affect: Mood normal.     Data Reviewed:  There are no new results to review at this time. CT ABDOMEN PELVIS W CONTRAST CLINICAL DATA:  88 year old female with history of left lower quadrant abdominal pain.  EXAM: CT  ABDOMEN AND PELVIS WITH CONTRAST  TECHNIQUE: Multidetector CT imaging of the abdomen and pelvis was performed using the standard protocol following bolus administration of intravenous contrast.  RADIATION DOSE REDUCTION: This exam was performed according to the departmental dose-optimization program which includes automated exposure control, adjustment of the mA and/or kV according to patient size and/or use of iterative reconstruction technique.  CONTRAST:  OMNIPAQUE IOHEXOL 300 MG/ML  SOLN  COMPARISON:  CT of the abdomen and pelvis 09/09/2020.  FINDINGS: Lower chest: Atherosclerotic calcifications in the descending thoracic aorta. Mild scarring in the lung bases bilaterally. Small hiatal hernia.  Hepatobiliary: No suspicious cystic or solid hepatic lesions. Status post cholecystectomy. No intrahepatic biliary ductal dilatation. Common bile duct is 10 mm in the porta hepatis, within normal limits given the patient's advanced age and post cholecystectomy status.  Pancreas: No pancreatic mass. No pancreatic ductal dilatation. Inflammatory changes and small volume of fluid adjacent to the distal body and tail of the pancreas concerning for potential pancreatitis. No well organized peripancreatic fluid collection is noted to suggest pseudocyst.  Spleen: Unremarkable.  Adrenals/Urinary Tract: Multiple nonobstructive calculi are  noted within the renal collecting systems bilaterally measuring up to 5 mm in the upper pole collecting system of the left kidney. No additional calculi are noted along the course of either ureter. No suspicious renal lesions. No hydroureteronephrosis. Urinary bladder is unremarkable in appearance. Bilateral adrenal glands are normal in appearance.  Stomach/Bowel: Intra-abdominal portion of the stomach is unremarkable in appearance. No pathologic dilatation of small bowel or colon. Postoperative changes of partial colectomy are noted in the region of the sigmoid colon. Normal appendix.  Vascular/Lymphatic: Atherosclerotic calcifications in the abdominal aorta. Ectasia of the infrarenal abdominal aorta which measures up to 2.5 x 2.2 cm. No lymphadenopathy noted in the abdomen or pelvis.  Reproductive: Uterus and ovaries are atrophic, but otherwise unremarkable in appearance.  Other: No significant volume of ascites.  No pneumoperitoneum.  Musculoskeletal: There are no aggressive appearing lytic or blastic lesions noted in the visualized portions of the skeleton.  IMPRESSION: 1. Inflammatory changes adjacent to the distal body and tail of the pancreas with fluid tracking caudally along the left side of the retroperitoneum, compatible with acute pancreatitis. No definite complicating features (i.e., no definite pancreatic necrosis or pseudocyst formation at this time). 2. Multiple nonobstructive calculi in the collecting systems of both kidneys measuring up to 5 mm in the upper pole collecting system of the left kidney. No ureteral stones or findings of urinary tract obstruction are noted at this time. 3. Aortic atherosclerosis with ectasia of the infrarenal abdominal aorta which measures up to 2.5 x 2.2 cm. 4. Small hiatal hernia. 5. Additional incidental findings, as above.  Electronically Signed   By: Trudie Reed M.D.   On: 10/09/2022 07:17  Lab Results  Component  Value Date   WBC 3.8 (L) 10/09/2022   HGB 14.3 10/09/2022   HCT 45.4 10/09/2022   MCV 92.1 10/09/2022   PLT 251 10/09/2022   Last metabolic panel Lab Results  Component Value Date   GLUCOSE 103 (H) 10/09/2022   NA 141 10/09/2022   K 3.8 10/09/2022   CL 105 10/09/2022   CO2 25 10/09/2022   BUN 27 (H) 10/09/2022   CREATININE 1.06 (H) 10/09/2022   GFRNONAA 51 (L) 10/09/2022   CALCIUM 8.5 (L) 10/09/2022   PROT 6.6 10/09/2022   ALBUMIN 4.0 10/09/2022   LABGLOB 3.2 05/14/2018   BILITOT 1.0 10/09/2022   ALKPHOS 95 10/09/2022  AST 22 10/09/2022   ALT 15 10/09/2022   ANIONGAP 11 10/09/2022    Assessment and Plan: * Pancreatitis Acute pancreatitis in the setting of patient having a very high fatty meal including steak, fried fish, cake yesterday Lipase 280 Noted acute pancreatitis on imaging Discussed dietary modification Check triglyceride levels Status postcholecystectomy Pain control Antiemetics Advance diet as tolerated Follow  HTN (hypertension) BP stable Titrate home regimen  Autoimmune neutropenia (HCC) Followed by Dr. Virl Son with hematology oncology On zarxio WBC 3.8 which is above baseline  Follow  GERD PPI      Advance Care Planning:   Code Status: Full Code   Consults: None   Family Communication: Daughter at the bedside   Severity of Illness: The appropriate patient status for this patient is OBSERVATION. Observation status is judged to be reasonable and necessary in order to provide the required intensity of service to ensure the patient's safety. The patient's presenting symptoms, physical exam findings, and initial radiographic and laboratory data in the context of their medical condition is felt to place them at decreased risk for further clinical deterioration. Furthermore, it is anticipated that the patient will be medically stable for discharge from the hospital within 2 midnights of admission.   Author: Floydene Flock, MD 10/09/2022  9:21 AM  For on call review [email or web address].

## 2022-10-09 NOTE — Assessment & Plan Note (Signed)
PPI ?

## 2022-10-09 NOTE — ED Notes (Signed)
Pt reportedly had a large BM in the lobby restroom, to where afterwards she was diaphoretic and hypotensive.  She's back to baseline, according to family that's at bedside and her coloring has returned.  BP more normal for pt, fluids running and doctor at bedside at this time.

## 2022-10-09 NOTE — Assessment & Plan Note (Signed)
Acute pancreatitis in the setting of patient having a very high fatty meal including steak, fried fish, cake yesterday Lipase 280 Noted acute pancreatitis on imaging Discussed dietary modification Check triglyceride levels Status postcholecystectomy Pain control Antiemetics Advance diet as tolerated Follow

## 2022-10-09 NOTE — ED Provider Notes (Signed)
[facility] Provider Note    Event Date/Time   First MD Initiated Contact with Patient 10/09/22 [phone]     (approximate)   History   Abdominal Pain   HPI  [name] is a 88 y.o. female history of neutropenia and previous partial colectomy according to the patient and her daughter.  She has chronic neutropenia, autoimmune.  Yesterday about 4 PM started noticing upper abdominal pain.  She relates the pain is in her very mid to upper abdomen and seems slightly swollen.  She felt very hard and "constipated".  She relates that the pain now seems to be in her left lower abdomen.  When she got here to the ER she had have a bowel movement, it was large and regular and she reports that alleviated quite a bit of the discomfort and the pain in her upper abdomen went away, but now she is left with a ongoing pain in her left lower abdomen.  No pain or burning with urination no fevers or chills no nausea or vomiting.  She reports a history of constipation, and problems with gut motility       Physical Exam   Triage Vital Signs: ED Triage Vitals  Enc Vitals Group     BP 10/09/22 0601 (!) 82/47     Pulse Rate 10/09/22 0601 71     Resp 10/09/22 0601 (!) 22     Temp 10/09/22 0601 98 F (36.7 C)     Temp Source 10/09/22 0601 Oral     SpO2 10/09/22 0601 95 %     Weight 10/09/22 0604 165 lb (74.8 kg)     Height 10/09/22 0602 5\' 5"  (1.651 m)     Head Circumference --      Peak Flow --      Pain Score 10/09/22 0602 7     Pain Loc --      Pain Edu? --      Excl. in GC? --     Most recent vital signs: Vitals:   10/09/22 0615 10/09/22 0630  BP: (!) 158/49 (!) 143/47  Pulse: 77 72  Resp: 14 17  Temp:    SpO2: 94% 97%  Patient appears to had a brief episode of hypotension during triage process when she had to use the bathroom.  Her vital signs have since normalized including blood pressure  Vitals:   10/09/22 0615 10/09/22 0630  BP: (!) 158/49 (!) 143/47   Pulse: 77 72  Resp: 14 17  Temp:    SpO2: 94% 97%      General: Awake, no distress.  She and her daughter both very pleasant CV:  Good peripheral perfusion.  Normal tones and rate Resp:  Normal effort.  Clear Abd:  No distention.  Soft nontender throughout majority of abdomen including epigastrium, but patient does report moderate reproducible tenderness in the left lower quadrant.  Some rebound tenderness is present left lower quadrant.  Examination most notable for focal discomfort to left lower abdomen at this time.  No obvious hernias or masses Other:  Warm well-perfused extremities   ED Results / Procedures / Treatments   Labs (all labs ordered are listed, but only abnormal results are displayed) Labs Reviewed  LIPASE, BLOOD - Abnormal; Notable for the following components:      Result Value   Lipase 280 (*)    All other components within normal limits  COMPREHENSIVE METABOLIC PANEL - Abnormal; Notable for the following components:  Glucose, Bld 103 (*)    BUN 27 (*)    Creatinine, Ser 1.06 (*)    Calcium 8.5 (*)    GFR, Estimated 51 (*)    All other components within normal limits  CBC - Abnormal; Notable for the following components:   WBC 3.8 (*)    RDW 15.9 (*)    All other components within normal limits  URINALYSIS, ROUTINE W REFLEX MICROSCOPIC   EKG interpreted by me at 605 heart rate 75 QRS 85 QTc 440 Normal sinus rhythm no evidence of ischemia  RADIOLOGY  CT abdomen pelvis pending at signout, to be followed up by Dr. Sidney Ace   PROCEDURES:  Critical Care performed: No  Procedures   MEDICATIONS ORDERED IN ED: Medications  lactated ringers bolus 1,000 mL (1,000 mLs Intravenous New Bag/Given 10/09/22 0622)  iohexol (OMNIPAQUE) 300 MG/ML solution 100 mL (100 mLs Intravenous Contrast Given 10/09/22 0657)     IMPRESSION / MDM / ASSESSMENT AND PLAN / ED COURSE  I reviewed the triage vital signs and the nursing notes.                               Differential diagnosis includes but is not limited to, abdominal perforation, aortic dissection, cholecystitis, appendicitis, diverticulitis, colitis, esophagitis/gastritis, kidney stone, pyelonephritis, urinary tract infection, aortic aneurysm. All are considered in decision and treatment plan. Based upon the patient's presentation and risk factors, as well as the patient's clinical history of previous surgery, leukopenia, and also on review of labs which are notable for an elevated lipase and again noted chronic leukopenia we will proceed with CT imaging to further evaluate.  I am concerned about the patient's previous epigastric pain along with its association with elevated lipase and now reproducible left lower quadrant pain.     Patient's presentation is most consistent with acute complicated illness / injury requiring diagnostic workup.   The patient is on the cardiac monitor to evaluate for evidence of arrhythmia and/or significant heart rate changes.   ----------------------------------------- 7:10AM on 10/09/2022 ----------------------------------------- Ongoing care and disposition assigned to Dr. Sidney Ace to follow-up on pending imaging as well as reassessment the patient for disposition.  Anticipate given the patient's now elevated lipase epigastric pain, history of leukopenia and age that she will likely benefit from offering observation, admission or further depending on results of her imaging and further testing     FINAL CLINICAL IMPRESSION(S) / ED DIAGNOSES   Final diagnoses:  Generalized abdominal pain     Rx / DC Orders   ED Discharge Orders     None        Note:  This document was prepared using Dragon voice recognition software and may include unintentional dictation errors.   Sharyn Creamer, MD 10/09/22 [phone]

## 2022-10-09 NOTE — ED Provider Notes (Signed)
Patient signed out to me pending CT.  CT abdomen pelvis is consistent with acute pancreatitis.  Patient's lipase is elevated which also is consistent with this.  On reassessment her pain is relatively well-controlled but she does endorse some ongoing nausea.  Did have some dizziness and had a vasovagal episode during triage today.  Given her age and unclear cause of her pancreatitis will admit.  Patient is agreeable to this.   Georga Hacking, MD 10/09/22 [phone]

## 2022-10-09 NOTE — ED Notes (Signed)
Pt ambulatory to and from bathroom with x1 assist. Pt back into bed and reconnected to monitoring. WCTM.

## 2022-10-09 NOTE — ED Triage Notes (Signed)
LLQ abd pain that onset 4 pm yesterday after eating. Slight relief with suppository. Daughter reports pt increasingly confused. Denies n/v/d. Denies h/a or chest pain. Pt oriented in triage but fatigued and requires redirection at times. Pt hypotensive.

## 2022-10-09 NOTE — Assessment & Plan Note (Signed)
BP stable Titrate home regimen 

## 2022-10-09 NOTE — ED Triage Notes (Signed)
First nurse note- Pt to triage via [facility] from home with c/o abd pain. LLQ. Onset apx 4pm yesterday. Pain began after eating. Pt took suppository and had slight relief, but pain came back. Denies N/V/D per EMS.  Ambulatory with walker.

## 2022-10-10 ENCOUNTER — Inpatient Hospital Stay: Payer: Medicare Other

## 2022-10-10 DIAGNOSIS — K859 Acute pancreatitis without necrosis or infection, unspecified: Secondary | ICD-10-CM | POA: Diagnosis not present

## 2022-10-10 LAB — COMPREHENSIVE METABOLIC PANEL
ALT: 11 U/L (ref 0–44)
AST: 22 U/L (ref 15–41)
Albumin: 3.2 g/dL — ABNORMAL LOW (ref 3.5–5.0)
Alkaline Phosphatase: 81 U/L (ref 38–126)
Anion gap: 7 (ref 5–15)
BUN: 13 mg/dL (ref 8–23)
CO2: 24 mmol/L (ref 22–32)
Calcium: 7.9 mg/dL — ABNORMAL LOW (ref 8.9–10.3)
Chloride: 107 mmol/L (ref 98–111)
Creatinine, Ser: 0.84 mg/dL (ref 0.44–1.00)
GFR, Estimated: >60 mL/min (ref >60)
Glucose, Bld: 79 mg/dL (ref 70–99)
Potassium: 3.8 mmol/L (ref 3.5–5.1)
Sodium: 138 mmol/L (ref 135–145)
Total Bilirubin: 2.2 mg/dL — ABNORMAL HIGH (ref 0.3–1.2)
Total Protein: 5.6 g/dL — ABNORMAL LOW (ref 6.5–8.1)

## 2022-10-10 LAB — CBC
HCT: 40.9 % (ref 36.0–46.0)
Hemoglobin: 12.7 g/dL (ref 12.0–15.0)
MCH: 28.9 pg (ref 26.0–34.0)
MCHC: 31.1 g/dL (ref 30.0–36.0)
MCV: 93 fL (ref 80.0–100.0)
Platelets: 193 10*3/uL (ref 150–400)
RBC: 4.4 MIL/uL (ref 3.87–5.11)
RDW: 15.9 % — ABNORMAL HIGH (ref 11.5–15.5)
WBC: 3.3 10*3/uL — ABNORMAL LOW (ref 4.0–10.5)
nRBC: 0 % (ref 0.0–0.2)

## 2022-10-10 MED ORDER — AMLODIPINE BESYLATE 5 MG PO TABS
5.0000 mg | ORAL_TABLET | Freq: Every day | ORAL | Status: DC
  Administered 2022-10-10 – 2022-10-11 (×2): 5 mg via ORAL
  Filled 2022-10-10 (×2): qty 1

## 2022-10-10 MED ORDER — ASPIRIN 81 MG PO TBEC
81.0000 mg | DELAYED_RELEASE_TABLET | Freq: Every day | ORAL | Status: DC
  Administered 2022-10-10 – 2022-10-11 (×2): 81 mg via ORAL
  Filled 2022-10-10 (×2): qty 1

## 2022-10-10 MED ORDER — ALPRAZOLAM 0.25 MG PO TABS: 0.2500 mg | ORAL_TABLET | Freq: Two times a day (BID) | ORAL | Status: DC | PRN

## 2022-10-10 NOTE — Progress Notes (Signed)
Progress Note   Patient: [name] ZOX:[id number] DOB: Jul 24, 1934 DOA: 10/09/2022     1 DOS: the patient was seen and examined on 10/10/2022    Subjective:  Patient seen and examined at bedside this morning She admits to improvement in her abdominal pain Diet has been advanced from n.p.o. to clear liquid diet Denies nausea vomiting chest pain or cough   Brief hospital course:  From HPI by Dr. Alvester Morin "[name] is a 88 y.o. 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  Patient reports eating a very high fatty/rich meal yesterday at a buffet.  Patient with fibrofatty had a large amount of food including prime rib, steak, salmon, multiple pieces of cake.  Patient states she had developed significant abdominal pain and decreased GI movement over the course of the night.  Noted prior history of pancreatitis in the past.  Had a cholecystectomy associated pancreatitis approximately 5 to 6 years ago.  No reported alcohol or tobacco use.  No fevers or chills.  Positive decreased p.o. intake.  No hemiparesis or confusion.  Decreased bowel movements. Presented to the ER afebrile, hemodynamically stable.  White count 3.8, hemoglobin 14.3, platelets 251, urinalysis within normal limits, lipase 280, creatinine 1.06.  CT abdomen pelvis with inflammatory changes adjacent to the distal body and tail of the pancreas concerning for acute pancreatitis.  "  Assessment and Plan:  Acute pancreatitis with no complications at this time Acute pancreatitis in the setting of patient having a very high fatty meal including steak, fried fish, cake  Lipase 280 Noted acute pancreatitis on imaging No findings of intrahepatic bile dilatation or stone, patient is status post cholecystectomy Discussed dietary modification Triglyceride level only 43 and unlikely to have contributed  to her acute pancreatitis We will continue as needed pain management Continue supplemental IV fluid Diet have been advanced from n.p.o. to clear liquid diet today    HTN (hypertension) BP stable Titrate home regimen   Autoimmune neutropenia (HCC) Followed by Dr. Virl Son with hematology oncology On zarxio WBC 3.8 which is above baseline  Follow   GERD Continue PPI    Advance Care Planning:   Code Status: Full Code    Consults: None    Family Communication: Daughter at the bedside   Physical Exam: Constitutional:      Appearance: She is normal weight.  HENT:     Head: Normocephalic and atraumatic.     Nose: Nose normal.     Mouth/Throat:     Mouth: Mucous membranes are moist.  Eyes:     Pupils: Pupils are equal, round, and reactive to light.  Cardiovascular:     Rate and Rhythm: Normal rate and regular rhythm.  Pulmonary:     Effort: Pulmonary effort is normal.  Abdominal:     General: Bowel sounds are normal.  Tenderness noted in the mid abdomen with no rebound Musculoskeletal:        General: Normal range of motion.  Skin:    General: Skin is warm.  Neurological:     General: No focal deficit present.  Psychiatric:        Mood and Affect: Mood normal.   Vitals:   10/10/22 0600 10/10/22 1000 10/10/22 1100 10/10/22 1233  BP: (!) 146/47 (!) 138/46 (!) 147/53 (!) 150/49  Pulse: 87 85 79 80  Resp: 19 (!) 25 20 18   Temp:    98.9 F (37.2 C)  TempSrc:      SpO2: 93% 98% 96% 100%  Weight:      Height:        Data Reviewed: I have reviewed patient's laboratory results showing WBC 3.3 sodium 138 potassium 3.8 creatinine 0.84.  I have also reviewed patient's CT scan findings and results is as documented above.    Time spent: 50 minutes  Author: Loyce Dys, MD 10/10/2022 2:51 PM  For on call review [email or web address].

## 2022-10-10 NOTE — ED Notes (Signed)
Floor notified of pt departure from ED 

## 2022-10-10 NOTE — ED Notes (Signed)
Attending to bedside

## 2022-10-11 DIAGNOSIS — I10 Essential (primary) hypertension: Secondary | ICD-10-CM | POA: Diagnosis not present

## 2022-10-11 DIAGNOSIS — K219 Gastro-esophageal reflux disease without esophagitis: Secondary | ICD-10-CM | POA: Diagnosis not present

## 2022-10-11 DIAGNOSIS — K859 Acute pancreatitis without necrosis or infection, unspecified: Secondary | ICD-10-CM | POA: Diagnosis not present

## 2022-10-11 DIAGNOSIS — D708 Other neutropenia: Secondary | ICD-10-CM | POA: Diagnosis not present

## 2022-10-11 LAB — CBC WITH DIFFERENTIAL/PLATELET
Abs Immature Granulocytes: 0 10*3/uL (ref 0.00–0.07)
Basophils Absolute: 0 10*3/uL (ref 0.0–0.1)
Basophils Relative: 1 %
Eosinophils Absolute: 0.1 10*3/uL (ref 0.0–0.5)
Eosinophils Relative: 4 %
HCT: 39.6 % (ref 36.0–46.0)
Hemoglobin: 12.3 g/dL (ref 12.0–15.0)
Immature Granulocytes: 0 %
Lymphocytes Relative: 36 %
Lymphs Abs: 0.8 10*3/uL (ref 0.7–4.0)
MCH: 28.5 pg (ref 26.0–34.0)
MCHC: 31.1 g/dL (ref 30.0–36.0)
MCV: 91.9 fL (ref 80.0–100.0)
Monocytes Absolute: 0.4 10*3/uL (ref 0.1–1.0)
Monocytes Relative: 18 %
Neutro Abs: 0.9 10*3/uL — ABNORMAL LOW (ref 1.7–7.7)
Neutrophils Relative %: 41 %
Platelets: 179 10*3/uL (ref 150–400)
RBC: 4.31 MIL/uL (ref 3.87–5.11)
RDW: 15.6 % — ABNORMAL HIGH (ref 11.5–15.5)
Smear Review: NORMAL
WBC: 2.2 10*3/uL — ABNORMAL LOW (ref 4.0–10.5)
nRBC: 0 % (ref 0.0–0.2)

## 2022-10-11 LAB — BASIC METABOLIC PANEL
Anion gap: 7 (ref 5–15)
BUN: 9 mg/dL (ref 8–23)
CO2: 24 mmol/L (ref 22–32)
Calcium: 8.3 mg/dL — ABNORMAL LOW (ref 8.9–10.3)
Chloride: 110 mmol/L (ref 98–111)
Creatinine, Ser: 0.82 mg/dL (ref 0.44–1.00)
GFR, Estimated: >60 mL/min (ref >60)
Glucose, Bld: 83 mg/dL (ref 70–99)
Potassium: 3.8 mmol/L (ref 3.5–5.1)
Sodium: 141 mmol/L (ref 135–145)

## 2022-10-11 MED ORDER — AMLODIPINE BESYLATE 5 MG PO TABS: 2.5000 mg | ORAL_TABLET | Freq: Every day | ORAL | Status: DC

## 2022-10-11 NOTE — Progress Notes (Signed)
[name] to be D/C'd Home per MD order.  Discussed prescriptions and follow up appointments with the patient. Prescriptions given to patient, medication list explained in detail. Pt verbalized understanding.  Allergies as of 10/11/2022       Reactions   Morphine Other (See Comments)   "done not work, makes me scream."   Benzalkonium Chloride Dermatitis   Other reaction(s): Unknown   Escitalopram    Deveioped a twitch   Hydralazine    Causes sx of heart pounding   Neomycin-bacitracin Zn-polymyx Dermatitis   Other reaction(s): Other (See Comments) REACTION: blisters skin REACTION: blisters skin REACTION: blisters skin   Bacitracin-neomycin-polymyxin Dermatitis   REACTION: blisters skin        Medication List     TAKE these medications    ALPRAZolam 0.25 MG tablet Commonly known as: XANAX Take 0.25 mg by mouth 2 (two) times daily as needed.   amLODipine 5 MG tablet Commonly known as: NORVASC Take 0.5 tablets (2.5 mg total) by mouth daily. Pt states that she she increased the tablet to 5 mg daily. She was taking 2.5 mg. She stated that she thought the half tablet was not working efficiently. I asked the patient to inform her pcp that her dosage was increased. What changed: how much to take   aspirin EC 81 MG tablet Take 81 mg daily by mouth.   b complex vitamins capsule Take 1 capsule by mouth daily.   B-12 PO Take 2,500 mcg by mouth daily.   Biotin 1000 MCG tablet Take 1,000 mcg by mouth daily.   linaclotide 145 MCG Caps capsule Commonly known as: Linzess Take 1 capsule (145 mcg total) by mouth daily. What changed:  when to take this reasons to take this   predniSONE 5 MG tablet Commonly known as: DELTASONE Take 5 mg by mouth daily.   traMADol 50 MG tablet Commonly known as: ULTRAM Take 50 mg by mouth daily.   vitamin C 1000 MG tablet Take 1,000 mg by mouth daily.   Vitamin D3 50 MCG (2000 UT) capsule Take 2,000 Units by mouth daily.    Zarxio 480 MCG/0.8ML Sosy injection Generic drug: filgrastim-sndz Inject 0.8 mLs (480 mcg total) into the skin once a week. Administer injection subcutaneously   ZINC OXIDE PO Take 1 tablet by mouth daily.        Vitals:   10/11/22 0323 10/11/22 0733  BP: (!) 148/51 (!) 150/52  Pulse: 82 82  Resp: 20 18  Temp: 98 F (36.7 C) 98 F (36.7 C)  SpO2: 98% 97%    Skin clean, dry and intact without evidence of skin break down, no evidence of skin tears noted. IV catheter discontinued intact. Site without signs and symptoms of complications. Dressing and pressure applied. Pt denies pain at this time. No complaints noted.  An After Visit Summary was printed and given to the patient. Patient escorted via WC, and D/C home via private auto.  Chales Pelissier C. Jilda Roche

## 2022-10-11 NOTE — Hospital Course (Signed)
[name] is a 88 y.o. 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  Patient reports eating a very high fatty/rich meal yesterday at a buffet.  Patient with fibrofatty had a large amount of food including prime rib, steak, salmon, multiple pieces of cake.  Patient states she had developed significant abdominal pain and decreased GI movement over the course of the night.  Noted prior history of pancreatitis in the past.  Had a cholecystectomy associated pancreatitis approximately 5 to 6 years ago.  No reported alcohol or tobacco use.   05/13: to the ER afebrile, hemodynamically stable. White count 3.8, hemoglobin 14.3, platelets 251, urinalysis within normal limits, lipase 280, creatinine 1.06. CT abdomen pelvis with inflammatory changes adjacent to the distal body and tail of the pancreas concerning for acute pancreatitis. Admitted to hospitalist service.  05/14: continue IV< advance diet   Consultants:  none  Procedures: none      ASSESSMENT & PLAN:   Principal Problem:   Pancreatitis Active Problems:   GERD   Autoimmune neutropenia (HCC)   HTN (hypertension)  Acute pancreatitis with no complications at this time S/p very high fatty meal including steak, fried fish, cake  Lipase 280 Noted acute pancreatitis on imaging No findings of intrahepatic bile dilatation or stone, patient is status post cholecystectomy dietary modification pain management supplemental IV fluid Diet have been advanced from n.p.o. to clear liquid diet yesterday to ***    HTN (hypertension) BP stable Titrate home regimen   Autoimmune neutropenia (HCC) Followed by Dr. Virl Son with hematology oncology On zarxio Follow CBC   GERD Continue PPI     DVT prophylaxis: lovenox  Pertinent IV fluids/nutrition: NS 100 mL/h --> *** Central lines / invasive devices:  none  Code Status: FULL CODE ACP documentation reviewed: 10/11/22 none on VYNCA   Current Admission Status: inpatient  TOC needs / Dispo plan: anticipate back home when stable no TOC needs Barriers to discharge / significant pending items: pain control, tolerate diet, off fluids

## 2022-10-11 NOTE — TOC CM/SW Note (Signed)
  Transition of Care [facility]) Screening Note   Patient Details  Name: [name] Date of Birth: [birth year]   Transition of Care [facility]) CM/SW Contact:    Margarito Liner, LCSW Phone Number: 10/11/2022, 10:24 AM    Transition of Care Department [facility]) has reviewed patient and no TOC needs have been identified at this time. We will continue to monitor patient advancement through interdisciplinary progression rounds. If new patient transition needs arise, please place a TOC consult.

## 2022-10-11 NOTE — Discharge Summary (Signed)
Physician Discharge Summary   Patient: [name] MRN: [id number]  DOB: 02/02/1935   Admit:     Date of Admission: 10/09/2022 Admitted from: home   Discharge: Date of discharge: 10/11/22 Disposition: Home Condition at discharge: good  CODE STATUS: FULL CODE     Discharge Physician: Sunnie Nielsen, DO [facility]     PCP: Gracelyn Nurse, MD  Recommendations for Outpatient Follow-up:  Follow up with PCP Gracelyn Nurse, MD in 1-2 weeks Monitor BP w/ PCP  Please obtain labs/tests: CBC, CMP in 1-2 weeks Please follow up on the following pending results: none PCP AND OTHER OUTPATIENT PROVIDERS: SEE BELOW FOR SPECIFIC DISCHARGE INSTRUCTIONS PRINTED FOR PATIENT IN ADDITION TO GENERIC AVS PATIENT INFO     Discharge Instructions     Increase activity slowly   Complete by: As directed          Discharge Diagnoses: Principal Problem:   Pancreatitis Active Problems:   GERD   Autoimmune neutropenia (HCC)   HTN (hypertension)       Hospital Course: [name] is a 88 y.o. 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  Patient reports eating a very high fatty/rich meal yesterday at a buffet.  Patient with fibrofatty had a large amount of food including prime rib, steak, salmon, multiple pieces of cake.  Patient states she had developed significant abdominal pain and decreased GI movement over the course of the night.  Noted prior history of pancreatitis in the past.  Had a cholecystectomy associated pancreatitis approximately 5 to 6 years ago.  No reported alcohol or tobacco use.   05/13: to the ER afebrile, hemodynamically stable. White count 3.8, hemoglobin 14.3, platelets 251, urinalysis within normal limits, lipase 280, creatinine 1.06. CT abdomen pelvis with inflammatory changes adjacent to the distal body and  tail of the pancreas concerning for acute pancreatitis. Admitted to hospitalist service.  05/14: continue IVF, advance diet  05/15: eager for d/c home, tolerated soft diet for lunch, ok to discharge   Consultants:  none  Procedures: none      ASSESSMENT & PLAN:    Acute pancreatitis with no complications at this time S/p very high fatty meal including steak, fried fish, cake  Lipase 280 Noted acute pancreatitis on imaging No findings of intrahepatic bile dilatation or stone, patient is status post cholecystectomy dietary modification pain management supplemental IV fluid Diet have been advanced from n.p.o. to clear liquid diet yesterday to soft diet today, discussed gradually returning to low-fat regular diet     HTN (hypertension) BP stable Titrate home regimen - reduce home amlodipine to 2.5 mg daily    Autoimmune neutropenia (HCC) Followed by Dr. Virl Son with hematology oncology On zarxio Follow CBC   GERD Continue PPI              Discharge Instructions  Allergies as of 10/11/2022       Reactions   Morphine Other (See Comments)   "done not work, makes me scream."   Benzalkonium Chloride Dermatitis   Other reaction(s): Unknown   Escitalopram    Deveioped a twitch   Hydralazine    Causes sx of heart pounding   Neomycin-bacitracin Zn-polymyx Dermatitis   Other reaction(s): Other (See Comments) REACTION: blisters skin REACTION: blisters skin REACTION: blisters skin   Bacitracin-neomycin-polymyxin Dermatitis   REACTION: blisters skin        Medication List  TAKE these medications    ALPRAZolam 0.25 MG tablet Commonly known as: XANAX Take 0.25 mg by mouth 2 (two) times daily as needed.   amLODipine 5 MG tablet Commonly known as: NORVASC Take 0.5 tablets (2.5 mg total) by mouth daily. Pt states that she she increased the tablet to 5 mg daily. She was taking 2.5 mg. She stated that she thought the half tablet was not working  efficiently. I asked the patient to inform her pcp that her dosage was increased. What changed: how much to take   aspirin EC 81 MG tablet Take 81 mg daily by mouth.   b complex vitamins capsule Take 1 capsule by mouth daily.   B-12 PO Take 2,500 mcg by mouth daily.   Biotin 1000 MCG tablet Take 1,000 mcg by mouth daily.   linaclotide 145 MCG Caps capsule Commonly known as: Linzess Take 1 capsule (145 mcg total) by mouth daily. What changed:  when to take this reasons to take this   predniSONE 5 MG tablet Commonly known as: DELTASONE Take 5 mg by mouth daily.   traMADol 50 MG tablet Commonly known as: ULTRAM Take 50 mg by mouth daily.   vitamin C 1000 MG tablet Take 1,000 mg by mouth daily.   Vitamin D3 50 MCG (2000 UT) capsule Take 2,000 Units by mouth daily.   Zarxio 480 MCG/0.8ML Sosy injection Generic drug: filgrastim-sndz Inject 0.8 mLs (480 mcg total) into the skin once a week. Administer injection subcutaneously   ZINC OXIDE PO Take 1 tablet by mouth daily.         Follow-up Information     Gracelyn Nurse, MD. Go on 10/17/2022.   Specialty: Internal Medicine Why: hospital follow-up, needs BP check. Go at 3:30pm. Contact information: [address] [place] [address] [phone]                 Allergies  Allergen Reactions   Morphine Other (See Comments)    "done not work, makes me scream."   Benzalkonium Chloride Dermatitis    Other reaction(s): Unknown   Escitalopram     Deveioped a twitch   Hydralazine     Causes sx of heart pounding   Neomycin-Bacitracin Zn-Polymyx Dermatitis    Other reaction(s): Other (See Comments) REACTION: blisters skin REACTION: blisters skin REACTION: blisters skin   Bacitracin-Neomycin-Polymyxin Dermatitis    REACTION: blisters skin     Subjective: pt feeling well this morning, no abdominal pain, no other concerns   Discharge Exam: BP (!) 150/52 (BP Location: Right Arm)   Pulse  82   Temp 98 F (36.7 C) (Oral)   Resp 18   Ht 5\' 5"  (1.651 m)   Wt 74.8 kg   SpO2 97%   BMI 27.46 kg/m  General: Pt is alert, awake, not in acute distress Cardiovascular: RRR, S1/S2 +, no rubs, no gallops Respiratory: CTA bilaterally, no wheezing, no rhonchi Abdominal: Soft, NT, ND, bowel sounds + Extremities: no edema, no cyanosis     The results of significant diagnostics from this hospitalization (including imaging, microbiology, ancillary and laboratory) are listed below for reference.     Microbiology: No results found for this or any previous visit (from the past 240 hour(s)).   Labs: BNP (last 3 results) No results for input(s): "BNP" in the last 8760 hours. Basic Metabolic Panel: Recent Labs  Lab 10/09/22 0606 10/10/22 0642 10/11/22 0442  NA 141 138 141  K 3.8 3.8 3.8  CL 105 107  110  CO2 25 24 24   GLUCOSE 103* 79 83  BUN 27* 13 9  CREATININE 1.06* 0.84 0.82  CALCIUM 8.5* 7.9* 8.3*   Liver Function Tests: Recent Labs  Lab 10/09/22 0606 10/10/22 0642  AST 22 22  ALT 15 11  ALKPHOS 95 81  BILITOT 1.0 2.2*  PROT 6.6 5.6*  ALBUMIN 4.0 3.2*   Recent Labs  Lab 10/09/22 0606  LIPASE 280*   No results for input(s): "AMMONIA" in the last 168 hours. CBC: Recent Labs  Lab 10/09/22 0606 10/10/22 0642 10/11/22 0442  WBC 3.8* 3.3* 2.2*  NEUTROABS  --   --  0.9*  HGB 14.3 12.7 12.3  HCT 45.4 40.9 39.6  MCV 92.1 93.0 91.9  PLT 251 193 179   Cardiac Enzymes: No results for input(s): "CKTOTAL", "CKMB", "CKMBINDEX", "TROPONINI" in the last 168 hours. BNP: Invalid input(s): "POCBNP" CBG: No results for input(s): "GLUCAP" in the last 168 hours. D-Dimer No results for input(s): "DDIMER" in the last 72 hours. Hgb A1c No results for input(s): "HGBA1C" in the last 72 hours. Lipid Profile Recent Labs    10/09/22 0606  TRIG 43   Thyroid function studies No results for input(s): "TSH", "T4TOTAL", "T3FREE", "THYROIDAB" in the last 72  hours.  Invalid input(s): "FREET3" Anemia work up No results for input(s): "VITAMINB12", "FOLATE", "FERRITIN", "TIBC", "IRON", "RETICCTPCT" in the last 72 hours. Urinalysis    Component Value Date/Time   COLORURINE YELLOW 10/09/2022 0820   APPEARANCEUR CLEAR 10/09/2022 0820   APPEARANCEUR Clear 11/04/2014 0845   LABSPEC 1.015 10/09/2022 0820   LABSPEC 1.025 08/11/2012 1358   PHURINE 7.5 10/09/2022 0820   GLUCOSEU NEGATIVE 10/09/2022 0820   GLUCOSEU Negative 08/11/2012 1358   HGBUR NEGATIVE 10/09/2022 0820   BILIRUBINUR NEGATIVE 10/09/2022 0820   BILIRUBINUR Negative 11/04/2014 0845   BILIRUBINUR Negative 08/11/2012 1358   KETONESUR NEGATIVE 10/09/2022 0820   PROTEINUR NEGATIVE 10/09/2022 0820   UROBILINOGEN 1.0 08/30/2007 2131   NITRITE NEGATIVE 10/09/2022 0820   LEUKOCYTESUR NEGATIVE 10/09/2022 0820   LEUKOCYTESUR Negative 08/11/2012 1358   Sepsis Labs Recent Labs  Lab 10/09/22 0606 10/10/22 0642 10/11/22 0442  WBC 3.8* 3.3* 2.2*   Microbiology No results found for this or any previous visit (from the past 240 hour(s)). Imaging CT ABDOMEN PELVIS W CONTRAST  Result Date: 10/09/2022 CLINICAL DATA:  88 year old female with history of left lower quadrant abdominal pain. EXAM: CT ABDOMEN AND PELVIS WITH CONTRAST TECHNIQUE: Multidetector CT imaging of the abdomen and pelvis was performed using the standard protocol following bolus administration of intravenous contrast. RADIATION DOSE REDUCTION: This exam was performed according to the departmental dose-optimization program which includes automated exposure control, adjustment of the mA and/or kV according to patient size and/or use of iterative reconstruction technique. CONTRAST:  OMNIPAQUE IOHEXOL 300 MG/ML  SOLN COMPARISON:  CT of the abdomen and pelvis 09/09/2020. FINDINGS: Lower chest: Atherosclerotic calcifications in the descending thoracic aorta. Mild scarring in the lung bases bilaterally. Small hiatal hernia.  Hepatobiliary: No suspicious cystic or solid hepatic lesions. Status post cholecystectomy. No intrahepatic biliary ductal dilatation. Common bile duct is 10 mm in the porta hepatis, within normal limits given the patient's advanced age and post cholecystectomy status. Pancreas: No pancreatic mass. No pancreatic ductal dilatation. Inflammatory changes and small volume of fluid adjacent to the distal body and tail of the pancreas concerning for potential pancreatitis. No well organized peripancreatic fluid collection is noted to suggest pseudocyst. Spleen: Unremarkable. Adrenals/Urinary Tract: Multiple nonobstructive calculi are  noted within the renal collecting systems bilaterally measuring up to 5 mm in the upper pole collecting system of the left kidney. No additional calculi are noted along the course of either ureter. No suspicious renal lesions. No hydroureteronephrosis. Urinary bladder is unremarkable in appearance. Bilateral adrenal glands are normal in appearance. Stomach/Bowel: Intra-abdominal portion of the stomach is unremarkable in appearance. No pathologic dilatation of small bowel or colon. Postoperative changes of partial colectomy are noted in the region of the sigmoid colon. Normal appendix. Vascular/Lymphatic: Atherosclerotic calcifications in the abdominal aorta. Ectasia of the infrarenal abdominal aorta which measures up to 2.5 x 2.2 cm. No lymphadenopathy noted in the abdomen or pelvis. Reproductive: Uterus and ovaries are atrophic, but otherwise unremarkable in appearance. Other: No significant volume of ascites.  No pneumoperitoneum. Musculoskeletal: There are no aggressive appearing lytic or blastic lesions noted in the visualized portions of the skeleton. IMPRESSION: 1. Inflammatory changes adjacent to the distal body and tail of the pancreas with fluid tracking caudally along the left side of the retroperitoneum, compatible with acute pancreatitis. No definite complicating features (i.e., no  definite pancreatic necrosis or pseudocyst formation at this time). 2. Multiple nonobstructive calculi in the collecting systems of both kidneys measuring up to 5 mm in the upper pole collecting system of the left kidney. No ureteral stones or findings of urinary tract obstruction are noted at this time. 3. Aortic atherosclerosis with ectasia of the infrarenal abdominal aorta which measures up to 2.5 x 2.2 cm. 4. Small hiatal hernia. 5. Additional incidental findings, as above. Electronically Signed   By: Trudie Reed M.D.   On: 10/09/2022 07:17      Time coordinating discharge: over 30 minutes  SIGNED:  Sunnie Nielsen DO [facility]

## 2022-10-17 ENCOUNTER — Inpatient Hospital Stay: Payer: Medicare Other

## 2022-10-17 DIAGNOSIS — Z811 Family history of alcohol abuse and dependence: Secondary | ICD-10-CM | POA: Diagnosis not present

## 2022-10-17 DIAGNOSIS — Z87891 Personal history of nicotine dependence: Secondary | ICD-10-CM | POA: Diagnosis not present

## 2022-10-17 DIAGNOSIS — R202 Paresthesia of skin: Secondary | ICD-10-CM | POA: Diagnosis not present

## 2022-10-17 DIAGNOSIS — D708 Other neutropenia: Secondary | ICD-10-CM

## 2022-10-17 DIAGNOSIS — Z79899 Other long term (current) drug therapy: Secondary | ICD-10-CM | POA: Diagnosis not present

## 2022-10-17 DIAGNOSIS — Z840 Family history of diseases of the skin and subcutaneous tissue: Secondary | ICD-10-CM | POA: Diagnosis not present

## 2022-10-17 DIAGNOSIS — Z800 Family history of malignant neoplasm of digestive organs: Secondary | ICD-10-CM | POA: Diagnosis not present

## 2022-10-17 DIAGNOSIS — F419 Anxiety disorder, unspecified: Secondary | ICD-10-CM | POA: Diagnosis not present

## 2022-10-17 DIAGNOSIS — D72819 Decreased white blood cell count, unspecified: Secondary | ICD-10-CM

## 2022-10-17 LAB — CBC WITH DIFFERENTIAL (CANCER CENTER ONLY)
Abs Immature Granulocytes: 0.02 10*3/uL (ref 0.00–0.07)
Basophils Absolute: 0 10*3/uL (ref 0.0–0.1)
Basophils Relative: 1 %
Eosinophils Absolute: 0.1 10*3/uL (ref 0.0–0.5)
Eosinophils Relative: 3 %
HCT: 44 % (ref 36.0–46.0)
Hemoglobin: 13.5 g/dL (ref 12.0–15.0)
Immature Granulocytes: 1 %
Lymphocytes Relative: 58 %
Lymphs Abs: 1.3 10*3/uL (ref 0.7–4.0)
MCH: 28.7 pg (ref 26.0–34.0)
MCHC: 30.7 g/dL (ref 30.0–36.0)
MCV: 93.6 fL (ref 80.0–100.0)
Monocytes Absolute: 0.2 10*3/uL (ref 0.1–1.0)
Monocytes Relative: 8 %
Neutro Abs: 0.7 10*3/uL — ABNORMAL LOW (ref 1.7–7.7)
Neutrophils Relative %: 29 %
Platelet Count: 302 10*3/uL (ref 150–400)
RBC: 4.7 MIL/uL (ref 3.87–5.11)
RDW: 14.9 % (ref 11.5–15.5)
WBC Count: 2.3 10*3/uL — ABNORMAL LOW (ref 4.0–10.5)
nRBC: 0 % (ref 0.0–0.2)

## 2022-10-17 MED ORDER — FILGRASTIM-SNDZ 480 MCG/0.8ML IJ SOSY
480.0000 ug | PREFILLED_SYRINGE | Freq: Once | INTRAMUSCULAR | Status: AC
  Administered 2022-10-17: 480 ug via SUBCUTANEOUS
  Filled 2022-10-17: qty 0.8

## 2022-10-18 DIAGNOSIS — Z09 Encounter for follow-up examination after completed treatment for conditions other than malignant neoplasm: Secondary | ICD-10-CM | POA: Diagnosis not present

## 2022-10-24 ENCOUNTER — Inpatient Hospital Stay: Payer: Medicare Other

## 2022-10-24 DIAGNOSIS — F419 Anxiety disorder, unspecified: Secondary | ICD-10-CM | POA: Diagnosis not present

## 2022-10-24 DIAGNOSIS — Z811 Family history of alcohol abuse and dependence: Secondary | ICD-10-CM | POA: Diagnosis not present

## 2022-10-24 DIAGNOSIS — D72819 Decreased white blood cell count, unspecified: Secondary | ICD-10-CM

## 2022-10-24 DIAGNOSIS — D708 Other neutropenia: Secondary | ICD-10-CM

## 2022-10-24 DIAGNOSIS — Z800 Family history of malignant neoplasm of digestive organs: Secondary | ICD-10-CM | POA: Diagnosis not present

## 2022-10-24 DIAGNOSIS — R202 Paresthesia of skin: Secondary | ICD-10-CM | POA: Diagnosis not present

## 2022-10-24 DIAGNOSIS — Z79899 Other long term (current) drug therapy: Secondary | ICD-10-CM | POA: Diagnosis not present

## 2022-10-24 DIAGNOSIS — Z87891 Personal history of nicotine dependence: Secondary | ICD-10-CM | POA: Diagnosis not present

## 2022-10-24 DIAGNOSIS — Z840 Family history of diseases of the skin and subcutaneous tissue: Secondary | ICD-10-CM | POA: Diagnosis not present

## 2022-10-24 LAB — CBC WITH DIFFERENTIAL (CANCER CENTER ONLY)
Abs Immature Granulocytes: 0.04 10*3/uL (ref 0.00–0.07)
Basophils Absolute: 0 10*3/uL (ref 0.0–0.1)
Basophils Relative: 1 %
Eosinophils Absolute: 0.1 10*3/uL (ref 0.0–0.5)
Eosinophils Relative: 4 %
HCT: 47.1 % — ABNORMAL HIGH (ref 36.0–46.0)
Hemoglobin: 14.4 g/dL (ref 12.0–15.0)
Immature Granulocytes: 1 %
Lymphocytes Relative: 54 %
Lymphs Abs: 1.6 10*3/uL (ref 0.7–4.0)
MCH: 28.6 pg (ref 26.0–34.0)
MCHC: 30.6 g/dL (ref 30.0–36.0)
MCV: 93.5 fL (ref 80.0–100.0)
Monocytes Absolute: 0.3 10*3/uL (ref 0.1–1.0)
Monocytes Relative: 10 %
Neutro Abs: 0.9 10*3/uL — ABNORMAL LOW (ref 1.7–7.7)
Neutrophils Relative %: 30 %
Platelet Count: 276 10*3/uL (ref 150–400)
RBC: 5.04 MIL/uL (ref 3.87–5.11)
RDW: 15.5 % (ref 11.5–15.5)
Smear Review: NORMAL
WBC Count: 3 10*3/uL — ABNORMAL LOW (ref 4.0–10.5)
nRBC: 0 % (ref 0.0–0.2)

## 2022-10-24 MED ORDER — FILGRASTIM-SNDZ 480 MCG/0.8ML IJ SOSY
480.0000 ug | PREFILLED_SYRINGE | Freq: Once | INTRAMUSCULAR | Status: AC
  Administered 2022-10-24: 480 ug via SUBCUTANEOUS
  Filled 2022-10-24: qty 0.8

## 2022-10-25 ENCOUNTER — Telehealth: Payer: Self-pay | Admitting: Gastroenterology

## 2022-10-25 NOTE — Telephone Encounter (Signed)
Pt left vm would like a call back to discuss her next appointment

## 2022-10-25 NOTE — Telephone Encounter (Signed)
Called patient back and she stated that she was recently at the hospital for pancreatitis and she wanted an appointment with Dr. Tobi Bastos as recommended by the ED. Appointment was scheduled for 10/31/2022 at 1:45 PM. Patient agreed.

## 2022-10-30 ENCOUNTER — Other Ambulatory Visit: Payer: Self-pay

## 2022-10-31 ENCOUNTER — Inpatient Hospital Stay: Payer: Medicare Other | Attending: Internal Medicine

## 2022-10-31 ENCOUNTER — Inpatient Hospital Stay: Payer: Medicare Other

## 2022-10-31 ENCOUNTER — Encounter: Payer: Self-pay | Admitting: Gastroenterology

## 2022-10-31 ENCOUNTER — Ambulatory Visit (INDEPENDENT_AMBULATORY_CARE_PROVIDER_SITE_OTHER): Payer: Medicare Other | Admitting: Gastroenterology

## 2022-10-31 VITALS — BP 135/78 | HR 90 | Temp 97.9°F | Wt 162.4 lb

## 2022-10-31 DIAGNOSIS — D708 Other neutropenia: Secondary | ICD-10-CM | POA: Insufficient documentation

## 2022-10-31 DIAGNOSIS — Z79899 Other long term (current) drug therapy: Secondary | ICD-10-CM | POA: Diagnosis not present

## 2022-10-31 DIAGNOSIS — Z8719 Personal history of other diseases of the digestive system: Secondary | ICD-10-CM | POA: Diagnosis not present

## 2022-10-31 LAB — CBC WITH DIFFERENTIAL (CANCER CENTER ONLY)
Abs Immature Granulocytes: 0.05 10*3/uL (ref 0.00–0.07)
Basophils Absolute: 0 10*3/uL (ref 0.0–0.1)
Basophils Relative: 1 %
Eosinophils Absolute: 0 10*3/uL (ref 0.0–0.5)
Eosinophils Relative: 1 %
HCT: 44.7 % (ref 36.0–46.0)
Hemoglobin: 14 g/dL (ref 12.0–15.0)
Immature Granulocytes: 2 %
Lymphocytes Relative: 27 %
Lymphs Abs: 0.8 10*3/uL (ref 0.7–4.0)
MCH: 29 pg (ref 26.0–34.0)
MCHC: 31.3 g/dL (ref 30.0–36.0)
MCV: 92.7 fL (ref 80.0–100.0)
Monocytes Absolute: 0.3 10*3/uL (ref 0.1–1.0)
Monocytes Relative: 12 %
Neutro Abs: 1.7 10*3/uL (ref 1.7–7.7)
Neutrophils Relative %: 57 %
Platelet Count: 196 10*3/uL (ref 150–400)
RBC: 4.82 MIL/uL (ref 3.87–5.11)
RDW: 15.7 % — ABNORMAL HIGH (ref 11.5–15.5)
Smear Review: NORMAL
WBC Count: 2.9 10*3/uL — ABNORMAL LOW (ref 4.0–10.5)
nRBC: 0 % (ref 0.0–0.2)

## 2022-10-31 NOTE — Patient Instructions (Signed)
Please arrive 15 minutes before your MRI. Nothing to eat or drink 4 hours prior. If you need to reschedule, please call [phone].

## 2022-10-31 NOTE — Progress Notes (Signed)
Wyline Mood MD, MRCP(U.K) [address]  [address]  [place], [place] [address]  Main: [phone]  Fax: [phone]   Primary Care Physician: Gracelyn Nurse, MD  Primary Gastroenterologist:  Dr. Wyline Mood   Chief Complaint  Patient presents with   Pancreatitis    HPI: [name] is a 88 y.o. female  Summary of history :   She has been followed for abdominal pain felt secondary to constipation.  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  Subsequent CT scan of the abdomen that showed the cyst that resolved.   Interval history 05/30/2022-10/31/2022  Admitted on 10/09/2022 and discharged 2 days later when she was admitted with pancreatitis.  First episode but has had gallstone pancreatitis 5 to 6 years back when she had a cholecystectomy.  Treated conservatively and sent home.  Lipase was 290.  Acute pancreatitis noted on CAT scan.  No elevation of transaminases bilirubin was 2.2.  Since that she has had no abdominal pain.  Denies any use of any new medications no herbal supplements no excess alcohol consumption pain all began right after meal central abdominal severe enough to call an ambulance.  Current Outpatient Medications  Medication Sig Dispense Refill   ALPRAZolam (XANAX) 0.25 MG tablet Take 0.25 mg by mouth 2 (two) times daily as needed.      amLODipine (NORVASC) 5 MG tablet Take 0.5 tablets (2.5 mg total) by mouth daily. Pt states that she she increased the tablet to 5 mg daily. She was taking 2.5 mg. She stated that she thought the half tablet was not working efficiently. I asked the patient to inform her pcp that her dosage was increased.     Apoaequorin 10 MG CAPS Take 1 capsule by mouth daily.     Ascorbic Acid (VITAMIN C) 1000 MG  tablet Take 1,000 mg by mouth daily.     aspirin EC 81 MG tablet Take 81 mg daily by mouth.     b complex vitamins capsule Take 1 capsule by mouth daily.     Biotin 1000 MCG tablet Take 1,000 mcg by mouth daily.      Cholecalciferol (VITAMIN D3) 2000 units capsule Take 2,000 Units by mouth daily.     Cyanocobalamin (B-12 PO) Take 2,500 mcg by mouth daily.     filgrastim-sndz (ZARXIO) 480 MCG/0.8ML SOSY injection Inject 0.8 mLs (480 mcg total) into the skin once a week. Administer injection subcutaneously 3.2 mL 6   linaclotide (LINZESS) 145 MCG CAPS capsule Take 1 capsule (145 mcg total) by mouth daily. (Patient taking differently: Take 145 mcg by mouth daily as needed.) 30 capsule 2   predniSONE (DELTASONE) 5 MG tablet Take 5 mg by mouth daily.     traMADol (ULTRAM) 50 MG tablet Take 50 mg by mouth daily.     ZINC OXIDE PO Take 1 tablet by mouth daily.     No current facility-administered medications for this visit.    Allergies as of 10/31/2022 - Review Complete 10/31/2022  Allergen Reaction Noted   Morphine Other (See Comments) 07/12/2018   Benzalkonium chloride Dermatitis 04/14/2014   Escitalopram  10/20/2014   Hydralazine  07/25/2018   Neomycin-bacitracin zn-polymyx Dermatitis 06/08/2008   Bacitracin-neomycin-polymyxin Dermatitis 06/08/2008    ROS:  General: Negative for  anorexia, weight loss, fever, chills, fatigue, weakness. ENT: Negative for hoarseness, difficulty swallowing , nasal congestion. CV: Negative for chest pain, angina, palpitations, dyspnea on exertion, peripheral edema.  Respiratory: Negative for dyspnea at rest, dyspnea on exertion, cough, sputum, wheezing.  GI: See history of present illness. GU:  Negative for dysuria, hematuria, urinary incontinence, urinary frequency, nocturnal urination.  Endo: Negative for unusual weight change.    Physical Examination:   BP 135/78   Pulse 90   Temp 97.9 F (36.6 C) (Oral)   Wt 162 lb 6.4 oz (73.7 kg)   BMI 27.02  kg/m   General: Well-nourished, well-developed in no acute distress.  Eyes: No icterus. Conjunctivae pink. Mouth: Oropharyngeal mucosa moist and pink , no lesions erythema or exudate. Abdomen: Bowel sounds are normal, nontender, nondistended, no hepatosplenomegaly or masses, no abdominal bruits or hernia , no rebound or guarding.   Extremities: No lower extremity edema. No clubbing or deformities. Neuro: Alert and oriented x 3.  Grossly intact. Skin: Warm and dry, no jaundice.   Psych: Alert and cooperative, normal mood and affect.   Imaging Studies: CT ABDOMEN PELVIS W CONTRAST  Result Date: 10/09/2022 CLINICAL DATA:  88 year old female with history of left lower quadrant abdominal pain. EXAM: CT ABDOMEN AND PELVIS WITH CONTRAST TECHNIQUE: Multidetector CT imaging of the abdomen and pelvis was performed using the standard protocol following bolus administration of intravenous contrast. RADIATION DOSE REDUCTION: This exam was performed according to the departmental dose-optimization program which includes automated exposure control, adjustment of the mA and/or kV according to patient size and/or use of iterative reconstruction technique. CONTRAST:  OMNIPAQUE IOHEXOL 300 MG/ML  SOLN COMPARISON:  CT of the abdomen and pelvis 09/09/2020. FINDINGS: Lower chest: Atherosclerotic calcifications in the descending thoracic aorta. Mild scarring in the lung bases bilaterally. Small hiatal hernia. Hepatobiliary: No suspicious cystic or solid hepatic lesions. Status post cholecystectomy. No intrahepatic biliary ductal dilatation. Common bile duct is 10 mm in the porta hepatis, within normal limits given the patient's advanced age and post cholecystectomy status. Pancreas: No pancreatic mass. No pancreatic ductal dilatation. Inflammatory changes and small volume of fluid adjacent to the distal body and tail of the pancreas concerning for potential pancreatitis. No well organized peripancreatic fluid  collection is noted to suggest pseudocyst. Spleen: Unremarkable. Adrenals/Urinary Tract: Multiple nonobstructive calculi are noted within the renal collecting systems bilaterally measuring up to 5 mm in the upper pole collecting system of the left kidney. No additional calculi are noted along the course of either ureter. No suspicious renal lesions. No hydroureteronephrosis. Urinary bladder is unremarkable in appearance. Bilateral adrenal glands are normal in appearance. Stomach/Bowel: Intra-abdominal portion of the stomach is unremarkable in appearance. No pathologic dilatation of small bowel or colon. Postoperative changes of partial colectomy are noted in the region of the sigmoid colon. Normal appendix. Vascular/Lymphatic: Atherosclerotic calcifications in the abdominal aorta. Ectasia of the infrarenal abdominal aorta which measures up to 2.5 x 2.2 cm. No lymphadenopathy noted in the abdomen or pelvis. Reproductive: Uterus and ovaries are atrophic, but otherwise unremarkable in appearance. Other: No significant volume of ascites.  No pneumoperitoneum. Musculoskeletal: There are no aggressive appearing lytic or blastic lesions noted in the visualized portions of the skeleton. IMPRESSION: 1. Inflammatory changes adjacent to the distal body and tail of the pancreas with fluid tracking caudally along the left side of the retroperitoneum, compatible with acute pancreatitis. No definite complicating features (i.e., no definite pancreatic necrosis or pseudocyst formation at this time). 2. Multiple nonobstructive  calculi in the collecting systems of both kidneys measuring up to 5 mm in the upper pole collecting system of the left kidney. No ureteral stones or findings of urinary tract obstruction are noted at this time. 3. Aortic atherosclerosis with ectasia of the infrarenal abdominal aorta which measures up to 2.5 x 2.2 cm. 4. Small hiatal hernia. 5. Additional incidental findings, as above. Electronically Signed    By: Trudie Reed M.D.   On: 10/09/2022 07:17    Assessment and Plan:   BALEY ALTHEIDE is a 88 y.o. y/o female is here today to see me as a hospital follow-up after an episode of pancreatitis confirmed on CT scan of the abdomen with lipase of 280.  Reviewing of her medications does not suggest any of the medication she takes is associated often with pancreatitis.  She has had a gallbladder taken out some years back.  CT scan of the abdomen showed no other abnormality except common bile duct of 10 mm in the porta hepatis which is normal at her age.  The pancreatitis was mostly in the distal body and tail of the pancreas.  She takes Neupogen for autoimmune neutropenia.  At this point of time is unclear as to the etiology of her pancreatitis with a history of autoimmune neutropenia I wonder if she has autoimmune pancreatitis.   Plan  Check CBC CMP  MRI MRCP of the abdomen in 6 to 8 weeks from initial episode in early July to look for any pancreatic lesions which may have triggered the episode of pancreatitis such as an IPMN or mass Check IgG4 levels, triglycerides.    Dr Wyline Mood  MD,MRCP [facility]) Follow up in 8 to 12 weeks with PA

## 2022-11-01 LAB — CBC WITH DIFFERENTIAL/PLATELET
Basophils Absolute: 0 10*3/uL (ref 0.0–0.2)
Hematocrit: 44.3 % (ref 34.0–46.6)
Immature Granulocytes: 2 %
MCV: 92 fL (ref 79–97)
Monocytes Absolute: 0.2 10*3/uL (ref 0.1–0.9)
Neutrophils: 44 %

## 2022-11-01 LAB — COMPREHENSIVE METABOLIC PANEL
Alkaline Phosphatase: 128 IU/L — ABNORMAL HIGH (ref 44–121)
CO2: 26 mmol/L (ref 20–29)
Globulin, Total: 2.1 g/dL (ref 1.5–4.5)
Glucose: 165 mg/dL — ABNORMAL HIGH (ref 70–99)
Potassium: 4.6 mmol/L (ref 3.5–5.2)

## 2022-11-01 NOTE — Progress Notes (Signed)
Hi mutual pt with autoimmune neutropenia on neupogen , recent pancreatitis being worked up -FYI recent CBC - unsure if lower than usual neutrophil count needs follow up vs intervention

## 2022-11-02 LAB — CBC WITH DIFFERENTIAL/PLATELET
Basos: 1 %
EOS (ABSOLUTE): 0 10*3/uL (ref 0.0–0.4)
Eos: 1 %
Hemoglobin: 14 g/dL (ref 11.1–15.9)
Immature Grans (Abs): 0 10*3/uL (ref 0.0–0.1)
Lymphocytes Absolute: 0.7 10*3/uL (ref 0.7–3.1)
Lymphs: 43 %
MCH: 29 pg (ref 26.6–33.0)
MCHC: 31.6 g/dL (ref 31.5–35.7)
Monocytes: 9 %
Neutrophils Absolute: 0.7 10*3/uL — ABNORMAL LOW (ref 1.4–7.0)
Platelets: 211 10*3/uL (ref 150–450)
RBC: 4.83 x10E6/uL (ref 3.77–5.28)
RDW: 13.4 % (ref 11.7–15.4)
WBC: 1.6 10*3/uL — CL (ref 3.4–10.8)

## 2022-11-02 LAB — COMPREHENSIVE METABOLIC PANEL
ALT: 15 IU/L (ref 0–32)
AST: 23 IU/L (ref 0–40)
Albumin/Globulin Ratio: 2.1 (ref 1.2–2.2)
Albumin: 4.4 g/dL (ref 3.7–4.7)
BUN/Creatinine Ratio: 17 (ref 12–28)
BUN: 18 mg/dL (ref 8–27)
Bilirubin Total: 0.9 mg/dL (ref 0.0–1.2)
Calcium: 9.3 mg/dL (ref 8.7–10.3)
Chloride: 102 mmol/L (ref 96–106)
Creatinine, Ser: 1.03 mg/dL — ABNORMAL HIGH (ref 0.57–1.00)
Sodium: 142 mmol/L (ref 134–144)
Total Protein: 6.5 g/dL (ref 6.0–8.5)
eGFR: 53 mL/min/{1.73_m2} — ABNORMAL LOW (ref >59)

## 2022-11-02 LAB — TRIGLYCERIDES: Triglycerides: 67 mg/dL (ref 0–149)

## 2022-11-02 LAB — IGG 4: IgG, Subclass 4: 26 mg/dL (ref 2–96)

## 2022-11-07 ENCOUNTER — Inpatient Hospital Stay: Payer: Medicare Other

## 2022-11-07 DIAGNOSIS — D72819 Decreased white blood cell count, unspecified: Secondary | ICD-10-CM

## 2022-11-07 DIAGNOSIS — D708 Other neutropenia: Secondary | ICD-10-CM | POA: Diagnosis not present

## 2022-11-07 LAB — CBC WITH DIFFERENTIAL (CANCER CENTER ONLY)
Abs Immature Granulocytes: 0.01 10*3/uL (ref 0.00–0.07)
Basophils Absolute: 0 10*3/uL (ref 0.0–0.1)
Basophils Relative: 1 %
Eosinophils Absolute: 0.1 10*3/uL (ref 0.0–0.5)
Eosinophils Relative: 2 %
HCT: 45.3 % (ref 36.0–46.0)
Hemoglobin: 14.1 g/dL (ref 12.0–15.0)
Immature Granulocytes: 0 %
Lymphocytes Relative: 48 %
Lymphs Abs: 1.3 10*3/uL (ref 0.7–4.0)
MCH: 28.8 pg (ref 26.0–34.0)
MCHC: 31.1 g/dL (ref 30.0–36.0)
MCV: 92.6 fL (ref 80.0–100.0)
Monocytes Absolute: 0.4 10*3/uL (ref 0.1–1.0)
Monocytes Relative: 15 %
Neutro Abs: 1 10*3/uL — ABNORMAL LOW (ref 1.7–7.7)
Neutrophils Relative %: 34 %
Platelet Count: 220 10*3/uL (ref 150–400)
RBC: 4.89 MIL/uL (ref 3.87–5.11)
RDW: 15.7 % — ABNORMAL HIGH (ref 11.5–15.5)
WBC Count: 2.8 10*3/uL — ABNORMAL LOW (ref 4.0–10.5)
nRBC: 0 % (ref 0.0–0.2)

## 2022-11-07 MED ORDER — FILGRASTIM-SNDZ 480 MCG/0.8ML IJ SOSY
480.0000 ug | PREFILLED_SYRINGE | Freq: Once | INTRAMUSCULAR | Status: AC
  Administered 2022-11-07: 480 ug via SUBCUTANEOUS
  Filled 2022-11-07: qty 0.8

## 2022-11-14 ENCOUNTER — Inpatient Hospital Stay: Payer: Medicare Other

## 2022-11-14 DIAGNOSIS — D708 Other neutropenia: Secondary | ICD-10-CM | POA: Diagnosis not present

## 2022-11-14 DIAGNOSIS — D72819 Decreased white blood cell count, unspecified: Secondary | ICD-10-CM

## 2022-11-14 LAB — CBC WITH DIFFERENTIAL (CANCER CENTER ONLY)
Abs Immature Granulocytes: 0.13 10*3/uL — ABNORMAL HIGH (ref 0.00–0.07)
Basophils Absolute: 0 10*3/uL (ref 0.0–0.1)
Basophils Relative: 1 %
Eosinophils Absolute: 0.1 10*3/uL (ref 0.0–0.5)
Eosinophils Relative: 3 %
HCT: 46.2 % — ABNORMAL HIGH (ref 36.0–46.0)
Hemoglobin: 14.4 g/dL (ref 12.0–15.0)
Immature Granulocytes: 4 %
Lymphocytes Relative: 50 %
Lymphs Abs: 1.5 10*3/uL (ref 0.7–4.0)
MCH: 29.1 pg (ref 26.0–34.0)
MCHC: 31.2 g/dL (ref 30.0–36.0)
MCV: 93.3 fL (ref 80.0–100.0)
Monocytes Absolute: 0.3 10*3/uL (ref 0.1–1.0)
Monocytes Relative: 10 %
Neutro Abs: 1 10*3/uL — ABNORMAL LOW (ref 1.7–7.7)
Neutrophils Relative %: 32 %
Platelet Count: 221 10*3/uL (ref 150–400)
RBC: 4.95 MIL/uL (ref 3.87–5.11)
RDW: 15.8 % — ABNORMAL HIGH (ref 11.5–15.5)
Smear Review: NORMAL
WBC Count: 3.1 10*3/uL — ABNORMAL LOW (ref 4.0–10.5)
nRBC: 0 % (ref 0.0–0.2)

## 2022-11-14 MED ORDER — FILGRASTIM-SNDZ 480 MCG/0.8ML IJ SOSY
480.0000 ug | PREFILLED_SYRINGE | Freq: Once | INTRAMUSCULAR | Status: AC
  Administered 2022-11-14: 480 ug via SUBCUTANEOUS
  Filled 2022-11-14: qty 0.8

## 2022-11-21 ENCOUNTER — Inpatient Hospital Stay: Payer: Medicare Other

## 2022-11-21 DIAGNOSIS — D708 Other neutropenia: Secondary | ICD-10-CM

## 2022-11-21 LAB — CBC WITH DIFFERENTIAL (CANCER CENTER ONLY)
Abs Immature Granulocytes: 0.18 10*3/uL — ABNORMAL HIGH (ref 0.00–0.07)
Basophils Absolute: 0 10*3/uL (ref 0.0–0.1)
Basophils Relative: 1 %
Eosinophils Absolute: 0 10*3/uL (ref 0.0–0.5)
Eosinophils Relative: 1 %
HCT: 46.1 % — ABNORMAL HIGH (ref 36.0–46.0)
Hemoglobin: 14.2 g/dL (ref 12.0–15.0)
Immature Granulocytes: 5 %
Lymphocytes Relative: 32 %
Lymphs Abs: 1.1 10*3/uL (ref 0.7–4.0)
MCH: 28.9 pg (ref 26.0–34.0)
MCHC: 30.8 g/dL (ref 30.0–36.0)
MCV: 93.7 fL (ref 80.0–100.0)
Monocytes Absolute: 0.3 10*3/uL (ref 0.1–1.0)
Monocytes Relative: 9 %
Neutro Abs: 1.8 10*3/uL (ref 1.7–7.7)
Neutrophils Relative %: 52 %
Platelet Count: 230 10*3/uL (ref 150–400)
RBC: 4.92 MIL/uL (ref 3.87–5.11)
RDW: 15.3 % (ref 11.5–15.5)
Smear Review: NORMAL
WBC Count: 3.4 10*3/uL — ABNORMAL LOW (ref 4.0–10.5)
nRBC: 0 % (ref 0.0–0.2)

## 2022-11-28 ENCOUNTER — Inpatient Hospital Stay: Payer: Medicare Other | Attending: Internal Medicine

## 2022-11-28 ENCOUNTER — Inpatient Hospital Stay: Payer: Medicare Other | Admitting: Nurse Practitioner

## 2022-11-28 ENCOUNTER — Inpatient Hospital Stay: Payer: Medicare Other

## 2022-11-28 ENCOUNTER — Inpatient Hospital Stay (HOSPITAL_BASED_OUTPATIENT_CLINIC_OR_DEPARTMENT_OTHER): Payer: Medicare Other | Admitting: Internal Medicine

## 2022-11-28 ENCOUNTER — Telehealth: Payer: Self-pay

## 2022-11-28 VITALS — BP 150/59 | HR 87 | Temp 98.0°F | Wt 162.0 lb

## 2022-11-28 DIAGNOSIS — Z8719 Personal history of other diseases of the digestive system: Secondary | ICD-10-CM | POA: Diagnosis not present

## 2022-11-28 DIAGNOSIS — Z800 Family history of malignant neoplasm of digestive organs: Secondary | ICD-10-CM | POA: Insufficient documentation

## 2022-11-28 DIAGNOSIS — R030 Elevated blood-pressure reading, without diagnosis of hypertension: Secondary | ICD-10-CM | POA: Diagnosis not present

## 2022-11-28 DIAGNOSIS — Z811 Family history of alcohol abuse and dependence: Secondary | ICD-10-CM | POA: Diagnosis not present

## 2022-11-28 DIAGNOSIS — Z9049 Acquired absence of other specified parts of digestive tract: Secondary | ICD-10-CM | POA: Insufficient documentation

## 2022-11-28 DIAGNOSIS — D708 Other neutropenia: Secondary | ICD-10-CM

## 2022-11-28 DIAGNOSIS — Z9013 Acquired absence of bilateral breasts and nipples: Secondary | ICD-10-CM | POA: Diagnosis not present

## 2022-11-28 DIAGNOSIS — Z885 Allergy status to narcotic agent status: Secondary | ICD-10-CM | POA: Diagnosis not present

## 2022-11-28 DIAGNOSIS — Z840 Family history of diseases of the skin and subcutaneous tissue: Secondary | ICD-10-CM | POA: Insufficient documentation

## 2022-11-28 DIAGNOSIS — D72819 Decreased white blood cell count, unspecified: Secondary | ICD-10-CM

## 2022-11-28 DIAGNOSIS — Z79899 Other long term (current) drug therapy: Secondary | ICD-10-CM | POA: Insufficient documentation

## 2022-11-28 DIAGNOSIS — Z8616 Personal history of COVID-19: Secondary | ICD-10-CM | POA: Diagnosis not present

## 2022-11-28 DIAGNOSIS — E785 Hyperlipidemia, unspecified: Secondary | ICD-10-CM | POA: Insufficient documentation

## 2022-11-28 DIAGNOSIS — Z87442 Personal history of urinary calculi: Secondary | ICD-10-CM | POA: Diagnosis not present

## 2022-11-28 DIAGNOSIS — Z87891 Personal history of nicotine dependence: Secondary | ICD-10-CM | POA: Diagnosis not present

## 2022-11-28 DIAGNOSIS — Z85828 Personal history of other malignant neoplasm of skin: Secondary | ICD-10-CM | POA: Insufficient documentation

## 2022-11-28 DIAGNOSIS — M316 Other giant cell arteritis: Secondary | ICD-10-CM | POA: Insufficient documentation

## 2022-11-28 LAB — CBC WITH DIFFERENTIAL (CANCER CENTER ONLY)
Abs Immature Granulocytes: 0.02 10*3/uL (ref 0.00–0.07)
Basophils Absolute: 0 10*3/uL (ref 0.0–0.1)
Basophils Relative: 1 %
Eosinophils Absolute: 0.1 10*3/uL (ref 0.0–0.5)
Eosinophils Relative: 2 %
HCT: 44.4 % (ref 36.0–46.0)
Hemoglobin: 13.7 g/dL (ref 12.0–15.0)
Immature Granulocytes: 1 %
Lymphocytes Relative: 30 %
Lymphs Abs: 0.8 10*3/uL (ref 0.7–4.0)
MCH: 28.8 pg (ref 26.0–34.0)
MCHC: 30.9 g/dL (ref 30.0–36.0)
MCV: 93.5 fL (ref 80.0–100.0)
Monocytes Absolute: 0.3 10*3/uL (ref 0.1–1.0)
Monocytes Relative: 10 %
Neutro Abs: 1.5 10*3/uL — ABNORMAL LOW (ref 1.7–7.7)
Neutrophils Relative %: 56 %
Platelet Count: 237 10*3/uL (ref 150–400)
RBC: 4.75 MIL/uL (ref 3.87–5.11)
RDW: 15.1 % (ref 11.5–15.5)
WBC Count: 2.7 10*3/uL — ABNORMAL LOW (ref 4.0–10.5)
nRBC: 0 % (ref 0.0–0.2)

## 2022-11-28 LAB — BASIC METABOLIC PANEL - CANCER CENTER ONLY
Anion gap: 8 (ref 5–15)
BUN: 14 mg/dL (ref 8–23)
CO2: 29 mmol/L (ref 22–32)
Calcium: 8.9 mg/dL (ref 8.9–10.3)
Chloride: 102 mmol/L (ref 98–111)
Creatinine: 1.03 mg/dL — ABNORMAL HIGH (ref 0.44–1.00)
GFR, Estimated: 53 mL/min — ABNORMAL LOW (ref >60)
Glucose, Bld: 91 mg/dL (ref 70–99)
Potassium: 4 mmol/L (ref 3.5–5.1)
Sodium: 139 mmol/L (ref 135–145)

## 2022-11-28 NOTE — Progress Notes (Signed)
Left and right arm and left leg needs to be looked at due to them having big red spots and leg with a spot on it.

## 2022-11-28 NOTE — Progress Notes (Signed)
[facility] CONSULT NOTE  Patient Care Team: Gracelyn Nurse, MD as PCP - General (Internal Medicine) Kandyce Rud., MD (Rheumatology) Dasher, Cliffton Asters, MD (Dermatology) Earna Coder, MD as Consulting Physician (Hematology and Oncology) Bud Face, MD as Consulting Physician (Otolaryngology)  CHIEF COMPLAINTS/PURPOSE OF CONSULTATION: Autoimmune neutropenia  #April 2019- Autoimmune neutropenia-weekly Granix/CBC [Dr.Crocoran]; BMBx- no malignancy [Bone marrow aspirate and biopsy on 09/04/2017 revealed a hypercellular marrow for age with trilineage hematopoiesis.  There was abundant mature neutrophils.  Significant dyspoiesis or increased blasts was not identified.  Flow cytometry revealed a predominance of T lymphocytes (16% of all cells) with no abnormal phenotype.  There was a minor B-cell population (13% of lymphocytes) with slight kappa light chain excess.  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    HISTORY OF PRESENTING ILLNESS: Alone.  Ambulating independently.  [name] 88 y.o.  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  Patient was admitted to hospital in May 2024 for pancreatitis.  Reports feeling better now.  No recent infections. She has not required Zarzio for the last 2 weeks.  Review of Systems  Constitutional:  Negative for chills, diaphoresis, fever, malaise/fatigue and weight loss.  HENT:  Negative for nosebleeds and sore throat.   Eyes:  Negative for double vision.  Respiratory:   Negative for cough, hemoptysis, sputum production, shortness of breath and wheezing.   Cardiovascular:  Negative for chest pain, palpitations and orthopnea.  Gastrointestinal:  Negative for blood in stool, constipation, diarrhea, heartburn, melena, nausea and vomiting.  Genitourinary:  Negative for dysuria, frequency and urgency.  Musculoskeletal:  Negative for back pain and joint pain.  Skin:  Negative for itching.  Neurological:  Positive for tingling. Negative for dizziness, focal weakness, weakness and headaches.  Endo/Heme/Allergies:  Bruises/bleeds easily.  Psychiatric/Behavioral:  Negative for depression. The patient is nervous/anxious. The patient does not have insomnia.      MEDICAL HISTORY:  Past Medical History:  Diagnosis Date   Breast cancer (HCC) 1985   bilateral mastectomies   Depression    1 time specific situation that she did not know how to deal wiht   Diverticulosis    Hiatal hernia    History of Bell's palsy    History of colon polyps    History of COVID-19    History of nephrolithiasis    History of pancreatitis    Hyperlipidemia    IBS (irritable bowel syndrome)    Osteoporosis    osteopenia in neck   Skin cancer    Temporal arteritis (HCC)     SURGICAL HISTORY: Past Surgical History:  Procedure Laterality Date   AUGMENTATION MAMMAPLASTY Bilateral 2000   COLON RESECTION SIGMOID N/A 10/13/2017   Procedure: COLON RESECTION SIGMOID;  Surgeon: Carolan Shiver, MD;  Location: [facility] ORS;  Service: General;  Laterality: N/A;   COLOSTOMY N/A 10/13/2017   Procedure: COLOSTOMY;  Surgeon: Carolan Shiver, MD;  Location: [facility] ORS;  Service: General;  Laterality: N/A;   LAPAROSCOPIC CHOLECYSTECTOMY  2009   MASTECTOMY  1983   bilateral    SOCIAL HISTORY: Social History  Socioeconomic History   Marital status: Widowed    Spouse name: Not on file   Number of children: 2   Years of education: college   Highest education level: Not on file   Occupational History   Occupation: Public relations account executive   Occupation: Retired  Tobacco Use   Smoking status: Former    Packs/day: 0.25    Years: 30.00    Additional pack years: 0.00    Total pack years: 7.50    Types: Cigarettes    Quit date: 05/17/2005    Years since quitting: 17.5   Smokeless tobacco: Never   Tobacco comments:    quit 11 years ago  Vaping Use   Vaping Use: Never used  Substance and Sexual Activity   Alcohol use: No    Alcohol/week: 0.0 standard drinks of alcohol   Drug use: No   Sexual activity: Not Currently  Other Topics Concern   Not on file  Social History Narrative   Patient lives at home alone.    Patient is retired.    Patient has some college.    Patient has 2 children.   Social Determinants of Health   Financial Resource Strain: Not on file  Food Insecurity: No Food Insecurity (10/09/2022)   Hunger Vital Sign    Worried About Running Out of Food in the Last Year: Never true    Ran Out of Food in the Last Year: Never true  Transportation Needs: No Transportation Needs (10/09/2022)   PRAPARE - Administrator, Civil Service (Medical): No    Lack of Transportation (Non-Medical): No  Physical Activity: Not on file  Stress: Not on file  Social Connections: Not on file  Intimate Partner Violence: Not At Risk (10/09/2022)   Humiliation, Afraid, Rape, and Kick questionnaire    Fear of Current or Ex-Partner: No    Emotionally Abused: No    Physically Abused: No    Sexually Abused: No    FAMILY HISTORY: Family History  Problem Relation Age of Onset   Colon cancer Mother 103   Scleroderma Father 55   Alcoholism Brother    Stomach cancer Neg Hx     ALLERGIES:  is allergic to morphine, benzalkonium chloride, escitalopram, hydralazine, neomycin-bacitracin zn-polymyx, and bacitracin-neomycin-polymyxin.  MEDICATIONS:  Current Outpatient Medications  Medication Sig Dispense Refill   ALPRAZolam (XANAX) 0.25 MG tablet Take 0.25 mg by mouth 2  (two) times daily as needed.      amLODipine (NORVASC) 5 MG tablet Take 0.5 tablets (2.5 mg total) by mouth daily. Pt states that she she increased the tablet to 5 mg daily. She was taking 2.5 mg. She stated that she thought the half tablet was not working efficiently. I asked the patient to inform her pcp that her dosage was increased.     Apoaequorin 10 MG CAPS Take 1 capsule by mouth daily.     Ascorbic Acid (VITAMIN C) 1000 MG tablet Take 1,000 mg by mouth daily.     aspirin EC 81 MG tablet Take 81 mg daily by mouth.     b complex vitamins capsule Take 1 capsule by mouth daily.     Biotin 1000 MCG tablet Take 1,000 mcg by mouth daily.      Cholecalciferol (VITAMIN D3) 2000 units capsule Take 2,000 Units by mouth daily.     Cyanocobalamin (B-12 PO) Take 2,500 mcg by mouth daily.     filgrastim-sndz (ZARXIO) 480 MCG/0.8ML SOSY injection Inject 0.8 mLs (480 mcg total) into the  skin once a week. Administer injection subcutaneously 3.2 mL 6   predniSONE (DELTASONE) 5 MG tablet Take 5 mg by mouth daily.     traMADol (ULTRAM) 50 MG tablet Take 50 mg by mouth daily.     ZINC OXIDE PO Take 1 tablet by mouth daily.     linaclotide (LINZESS) 145 MCG CAPS capsule Take 1 capsule (145 mcg total) by mouth daily. (Patient not taking: Reported on 11/28/2022) 30 capsule 2   No current facility-administered medications for this visit.    PHYSICAL EXAMINATION: ECOG PERFORMANCE STATUS: 0 - Asymptomatic  Vitals:   11/28/22 1028  BP: (!) 150/59  Pulse: 87  Temp: 98 F (36.7 C)  SpO2: 98%   Filed Weights   11/28/22 1028  Weight: 162 lb (73.5 kg)    Physical Exam Constitutional:      Comments: Alone.  HENT:     Head: Normocephalic and atraumatic.     Mouth/Throat:     Pharynx: No oropharyngeal exudate.  Eyes:     Pupils: Pupils are equal, round, and reactive to light.  Cardiovascular:     Rate and Rhythm: Normal rate and regular rhythm.  Pulmonary:     Effort: No respiratory distress.      Breath sounds: No wheezing.  Abdominal:     General: Bowel sounds are normal. There is no distension.     Palpations: Abdomen is soft. There is no mass.     Tenderness: There is no abdominal tenderness. There is no guarding or rebound.     Comments: Colostomy.  Parastomal hernia.  Musculoskeletal:        General: No tenderness. Normal range of motion.     Cervical back: Normal range of motion and neck supple.  Skin:    General: Skin is warm.     Comments: Hyperpigmented rash noted bilateral shins.  Right arm bruising/thin skin.  Neurological:     Mental Status: She is alert and oriented to person, place, and time.  Psychiatric:        Mood and Affect: Affect normal.     Comments: Anxious.      LABORATORY DATA:  I have reviewed the data as listed Lab Results  Component Value Date   WBC 2.7 (L) 11/28/2022   HGB 13.7 11/28/2022   HCT 44.4 11/28/2022   MCV 93.5 11/28/2022   PLT 237 11/28/2022   Recent Labs    10/09/22 0606 10/10/22 0642 10/11/22 0442 10/31/22 1426 11/28/22 1009  NA 141 138 141 142 139  K 3.8 3.8 3.8 4.6 4.0  CL 105 107 110 102 102  CO2 25 24 24 26 29   GLUCOSE 103* 79 83 165* 91  BUN 27* 13 9 18 14   CREATININE 1.06* 0.84 0.82 1.03* 1.03*  CALCIUM 8.5* 7.9* 8.3* 9.3 8.9  GFRNONAA 51* >60 >60  --  53*  PROT 6.6 5.6*  --  6.5  --   ALBUMIN 4.0 3.2*  --  4.4  --   AST 22 22  --  23  --   ALT 15 11  --  15  --   ALKPHOS 95 81  --  128*  --   BILITOT 1.0 2.2*  --  0.9  --      RADIOGRAPHIC STUDIES: I have personally reviewed the radiological images as listed and agreed with the findings in the report. No results found.   ASSESSMENT & PLAN:  Autoimmune neutropenia (HCC) # Autoimmune neutropenia- once a week- if ANC <  1.5 on- zarxio.   # WBC today 2.7 with ANC of 1.5.  Will hold Zarxio today.  She has not required Zarzio injections for 2 to 3 weeks now.  Will continue with her schedule of weekly labs with Zarzio and follow-up with Dr. B in 3  months.   # Recent COVID s/p Anti-viral resolved.    # Elevated BP [at home ~130]- stable.    # Hx of temporal arteritis->20 years on low dose prednisone-stable.     # Hx of B12 deficiency- July 2023- > 3400- recommend B12 PO three times week- stable.  B12 level pending today   # DISPOSITION: # weekly cbc/zaxio x 12 # follow up with MD in 12  Weeks; - cbc/bmp/ b12 levels-zarxio-Dr.B  All questions were answered. The patient knows to call the clinic with any problems, questions or concerns.    Michaelyn Barter, MD 11/28/2022 11:17 AM

## 2022-11-29 LAB — VITAMIN B12: Vitamin B-12: 2708 pg/mL — ABNORMAL HIGH (ref 180–914)

## 2022-12-05 ENCOUNTER — Ambulatory Visit
Admission: RE | Admit: 2022-12-05 | Discharge: 2022-12-05 | Disposition: A | Discharge status: Home / Self Care | Payer: Medicare Other | Source: Ambulatory Visit | Attending: Gastroenterology | Admitting: Gastroenterology

## 2022-12-05 ENCOUNTER — Inpatient Hospital Stay: Payer: Medicare Other

## 2022-12-05 ENCOUNTER — Other Ambulatory Visit: Payer: Self-pay | Admitting: Gastroenterology

## 2022-12-05 VITALS — BP 155/62 | HR 89 | Temp 98.0°F | Resp 17

## 2022-12-05 DIAGNOSIS — D72819 Decreased white blood cell count, unspecified: Secondary | ICD-10-CM

## 2022-12-05 DIAGNOSIS — D708 Other neutropenia: Secondary | ICD-10-CM

## 2022-12-05 DIAGNOSIS — Z8719 Personal history of other diseases of the digestive system: Secondary | ICD-10-CM

## 2022-12-05 LAB — CBC WITH DIFFERENTIAL (CANCER CENTER ONLY)
Abs Immature Granulocytes: 0.04 10*3/uL (ref 0.00–0.07)
Basophils Absolute: 0 10*3/uL (ref 0.0–0.1)
Basophils Relative: 1 %
Eosinophils Absolute: 0.1 10*3/uL (ref 0.0–0.5)
Eosinophils Relative: 3 %
HCT: 45.5 % (ref 36.0–46.0)
Hemoglobin: 14.3 g/dL (ref 12.0–15.0)
Immature Granulocytes: 2 %
Lymphocytes Relative: 45 %
Lymphs Abs: 1.1 10*3/uL (ref 0.7–4.0)
MCH: 28.9 pg (ref 26.0–34.0)
MCHC: 31.4 g/dL (ref 30.0–36.0)
MCV: 92.1 fL (ref 80.0–100.0)
Monocytes Absolute: 0.3 10*3/uL (ref 0.1–1.0)
Monocytes Relative: 13 %
Neutro Abs: 0.9 10*3/uL — ABNORMAL LOW (ref 1.7–7.7)
Neutrophils Relative %: 36 %
Platelet Count: 244 10*3/uL (ref 150–400)
RBC: 4.94 MIL/uL (ref 3.87–5.11)
RDW: 15 % (ref 11.5–15.5)
WBC Count: 2.4 10*3/uL — ABNORMAL LOW (ref 4.0–10.5)
nRBC: 0 % (ref 0.0–0.2)

## 2022-12-05 MED ORDER — FILGRASTIM-SNDZ 480 MCG/0.8ML IJ SOSY
480.0000 ug | PREFILLED_SYRINGE | Freq: Once | INTRAMUSCULAR | Status: AC
  Administered 2022-12-05: 480 ug via SUBCUTANEOUS
  Filled 2022-12-05: qty 0.8

## 2022-12-05 MED ORDER — GADOBUTROL 1 MMOL/ML IV SOLN
7.5000 mL | Freq: Once | INTRAVENOUS | Status: AC | PRN
  Administered 2022-12-05: 7.5 mL via INTRAVENOUS

## 2022-12-05 NOTE — Patient Instructions (Signed)
Filgrastim Injection What is this medication? FILGRASTIM (fil GRA stim) lowers the risk of infection in people who are receiving chemotherapy. It works by helping your body make more white blood cells, which protects your body from infection. It may also be used to help people who have been exposed to high doses of radiation. It can be used to help prepare your body before a stem cell transplant. It works by helping your bone marrow make and release stem cells into the blood. This medicine may be used for other purposes; ask your health care provider or pharmacist if you have questions. COMMON BRAND NAME(S): Neupogen, Nivestym, Releuko, Zarxio What should I tell my care team before I take this medication? They need to know if you have any of these conditions: History of blood diseases, such as sickle cell anemia Kidney disease Recent or ongoing radiation An unusual or allergic reaction to filgrastim, pegfilgrastim, latex, rubber, other medications, foods, dyes, or preservatives Pregnant or trying to get pregnant Breast-feeding How should I use this medication? This medication is injected under the skin or into a vein. It is usually given by your care team in a hospital or clinic setting. It may be given at home. If you get this medication at home, you will be taught how to prepare and give it. Use exactly as directed. Take it as directed on the prescription label at the same time every day. Keep taking it unless your care team tells you to stop. It is important that you put your used needles and syringes in a special sharps container. Do not put them in a trash can. If you do not have a sharps container, call your pharmacist or care team to get one. This medication comes with INSTRUCTIONS FOR USE. Ask your pharmacist for directions on how to use this medication. Read the information carefully. Talk to your pharmacist or care team if you have questions. Talk to your care team about the use of this  medication in children. While it may be prescribed for children for selected conditions, precautions do apply. Overdosage: If you think you have taken too much of this medicine contact a poison control center or emergency room at once. NOTE: This medicine is only for you. Do not share this medicine with others. What if I miss a dose? It is important not to miss any doses. Talk to your care team about what to do if you miss a dose. What may interact with this medication? Medications that may cause a release of neutrophils, such as lithium This list may not describe all possible interactions. Give your health care provider a list of all the medicines, herbs, non-prescription drugs, or dietary supplements you use. Also tell them if you smoke, drink alcohol, or use illegal drugs. Some items may interact with your medicine. What should I watch for while using this medication? Your condition will be monitored carefully while you are receiving this medication. You may need bloodwork while taking this medication. Talk to your care team about your risk of cancer. You may be more at risk for certain types of cancer if you take this medication. What side effects may I notice from receiving this medication? Side effects that you should report to your care team as soon as possible: Allergic reactions--skin rash, itching, hives, swelling of the face, lips, tongue, or throat Capillary leak syndrome--stomach or muscle pain, unusual weakness or fatigue, feeling faint or lightheaded, decrease in the amount of urine, swelling of the ankles, hands, or   feet, trouble breathing High white blood cell level--fever, fatigue, trouble breathing, night sweats, change in vision, weight loss Inflammation of the aorta--fever, fatigue, back, chest, or stomach pain, severe headache Kidney injury (glomerulonephritis)--decrease in the amount of urine, red or dark brown urine, foamy or bubbly urine, swelling of the ankles, hands, or  feet Shortness of breath or trouble breathing Spleen injury--pain in upper left stomach or shoulder Unusual bruising or bleeding Side effects that usually do not require medical attention (report to your care team if they continue or are bothersome): Back pain Bone pain Fatigue Fever Headache Nausea This list may not describe all possible side effects. Call your doctor for medical advice about side effects. You may report side effects to FDA at 1-800-FDA-1088. Where should I keep my medication? Keep out of the reach of children and pets. Keep this medication in the original packaging until you are ready to take it. Protect from light. See product for storage information. Each product may have different instructions. Get rid of any unused medication after the expiration date. To get rid of medications that are no longer needed or have expired: Take the medication to a medications take-back program. Check with your pharmacy or law enforcement to find a location. If you cannot return the medication, ask your pharmacist or care team how to get rid of this medication safely. NOTE: This sheet is a summary. It may not cover all possible information. If you have questions about this medicine, talk to your doctor, pharmacist, or health care provider.  2024 Elsevier/Gold Standard (2021-10-06 00:00:00)  

## 2022-12-06 ENCOUNTER — Other Ambulatory Visit: Payer: Medicare Other

## 2022-12-06 ENCOUNTER — Inpatient Hospital Stay: Payer: Medicare Other

## 2022-12-06 ENCOUNTER — Encounter: Payer: Self-pay | Admitting: Gastroenterology

## 2022-12-11 NOTE — Progress Notes (Signed)
Looks improved - no concerning findings

## 2022-12-12 ENCOUNTER — Inpatient Hospital Stay: Payer: Medicare Other

## 2022-12-12 DIAGNOSIS — D708 Other neutropenia: Secondary | ICD-10-CM | POA: Diagnosis not present

## 2022-12-12 DIAGNOSIS — D72819 Decreased white blood cell count, unspecified: Secondary | ICD-10-CM

## 2022-12-12 LAB — CBC WITH DIFFERENTIAL (CANCER CENTER ONLY)
Abs Immature Granulocytes: 0 10*3/uL (ref 0.00–0.07)
Basophils Absolute: 0 10*3/uL (ref 0.0–0.1)
Basophils Relative: 1 %
Eosinophils Absolute: 0.1 10*3/uL (ref 0.0–0.5)
Eosinophils Relative: 2 %
HCT: 46.2 % — ABNORMAL HIGH (ref 36.0–46.0)
Hemoglobin: 14.5 g/dL (ref 12.0–15.0)
Immature Granulocytes: 0 %
Lymphocytes Relative: 49 %
Lymphs Abs: 1.3 10*3/uL (ref 0.7–4.0)
MCH: 28.9 pg (ref 26.0–34.0)
MCHC: 31.4 g/dL (ref 30.0–36.0)
MCV: 92.2 fL (ref 80.0–100.0)
Monocytes Absolute: 0.2 10*3/uL (ref 0.1–1.0)
Monocytes Relative: 7 %
Neutro Abs: 1.1 10*3/uL — ABNORMAL LOW (ref 1.7–7.7)
Neutrophils Relative %: 41 %
Platelet Count: 218 10*3/uL (ref 150–400)
RBC: 5.01 MIL/uL (ref 3.87–5.11)
RDW: 14.9 % (ref 11.5–15.5)
Smear Review: NORMAL
WBC Count: 2.7 10*3/uL — ABNORMAL LOW (ref 4.0–10.5)
nRBC: 0 % (ref 0.0–0.2)

## 2022-12-12 MED ORDER — FILGRASTIM-SNDZ 480 MCG/0.8ML IJ SOSY
480.0000 ug | PREFILLED_SYRINGE | Freq: Once | INTRAMUSCULAR | Status: AC
  Administered 2022-12-12: 480 ug via SUBCUTANEOUS
  Filled 2022-12-12: qty 0.8

## 2022-12-13 ENCOUNTER — Inpatient Hospital Stay: Payer: Medicare Other

## 2022-12-13 ENCOUNTER — Other Ambulatory Visit: Payer: Medicare Other

## 2022-12-19 ENCOUNTER — Inpatient Hospital Stay: Payer: Medicare Other

## 2022-12-19 DIAGNOSIS — D72819 Decreased white blood cell count, unspecified: Secondary | ICD-10-CM

## 2022-12-19 DIAGNOSIS — D708 Other neutropenia: Secondary | ICD-10-CM | POA: Diagnosis not present

## 2022-12-19 LAB — CBC WITH DIFFERENTIAL (CANCER CENTER ONLY)
Abs Immature Granulocytes: 0.04 10*3/uL (ref 0.00–0.07)
Basophils Absolute: 0 10*3/uL (ref 0.0–0.1)
Basophils Relative: 1 %
Eosinophils Absolute: 0.1 10*3/uL (ref 0.0–0.5)
Eosinophils Relative: 2 %
HCT: 46.3 % — ABNORMAL HIGH (ref 36.0–46.0)
Hemoglobin: 14.2 g/dL (ref 12.0–15.0)
Immature Granulocytes: 1 %
Lymphocytes Relative: 40 %
Lymphs Abs: 1.1 10*3/uL (ref 0.7–4.0)
MCH: 28.5 pg (ref 26.0–34.0)
MCHC: 30.7 g/dL (ref 30.0–36.0)
MCV: 93 fL (ref 80.0–100.0)
Monocytes Absolute: 0.4 10*3/uL (ref 0.1–1.0)
Monocytes Relative: 13 %
Neutro Abs: 1.2 10*3/uL — ABNORMAL LOW (ref 1.7–7.7)
Neutrophils Relative %: 43 %
Platelet Count: 206 10*3/uL (ref 150–400)
RBC: 4.98 MIL/uL (ref 3.87–5.11)
RDW: 14.9 % (ref 11.5–15.5)
Smear Review: NORMAL
WBC Count: 2.8 10*3/uL — ABNORMAL LOW (ref 4.0–10.5)
nRBC: 0 % (ref 0.0–0.2)

## 2022-12-19 MED ORDER — FILGRASTIM-SNDZ 480 MCG/0.8ML IJ SOSY
480.0000 ug | PREFILLED_SYRINGE | Freq: Once | INTRAMUSCULAR | Status: AC
  Administered 2022-12-19: 480 ug via SUBCUTANEOUS
  Filled 2022-12-19: qty 0.8

## 2022-12-20 ENCOUNTER — Other Ambulatory Visit: Payer: Medicare Other

## 2022-12-20 ENCOUNTER — Encounter: Payer: Self-pay | Admitting: Internal Medicine

## 2022-12-20 ENCOUNTER — Inpatient Hospital Stay: Payer: Medicare Other

## 2022-12-26 ENCOUNTER — Inpatient Hospital Stay: Payer: Medicare Other

## 2022-12-26 DIAGNOSIS — D708 Other neutropenia: Secondary | ICD-10-CM

## 2022-12-26 DIAGNOSIS — D72819 Decreased white blood cell count, unspecified: Secondary | ICD-10-CM

## 2022-12-26 LAB — CBC WITH DIFFERENTIAL (CANCER CENTER ONLY)
Abs Immature Granulocytes: 0.12 10*3/uL — ABNORMAL HIGH (ref 0.00–0.07)
Basophils Absolute: 0 10*3/uL (ref 0.0–0.1)
Basophils Relative: 1 %
Eosinophils Absolute: 0.1 10*3/uL (ref 0.0–0.5)
Eosinophils Relative: 2 %
HCT: 46.2 % — ABNORMAL HIGH (ref 36.0–46.0)
Hemoglobin: 14.3 g/dL (ref 12.0–15.0)
Immature Granulocytes: 4 %
Lymphocytes Relative: 52 %
Lymphs Abs: 1.5 10*3/uL (ref 0.7–4.0)
MCH: 28.5 pg (ref 26.0–34.0)
MCHC: 31 g/dL (ref 30.0–36.0)
MCV: 92.2 fL (ref 80.0–100.0)
Monocytes Absolute: 0.3 10*3/uL (ref 0.1–1.0)
Monocytes Relative: 12 %
Neutro Abs: 0.9 10*3/uL — ABNORMAL LOW (ref 1.7–7.7)
Neutrophils Relative %: 29 %
Platelet Count: 252 10*3/uL (ref 150–400)
RBC: 5.01 MIL/uL (ref 3.87–5.11)
RDW: 15.2 % (ref 11.5–15.5)
Smear Review: NORMAL
WBC Count: 2.9 10*3/uL — ABNORMAL LOW (ref 4.0–10.5)
nRBC: 0 % (ref 0.0–0.2)

## 2022-12-26 MED ORDER — FILGRASTIM-SNDZ 480 MCG/0.8ML IJ SOSY
480.0000 ug | PREFILLED_SYRINGE | Freq: Once | INTRAMUSCULAR | Status: AC
  Administered 2022-12-26: 480 ug via SUBCUTANEOUS
  Filled 2022-12-26: qty 0.8

## 2022-12-27 ENCOUNTER — Other Ambulatory Visit: Payer: Medicare Other

## 2022-12-27 ENCOUNTER — Inpatient Hospital Stay: Payer: Medicare Other

## 2022-12-28 ENCOUNTER — Ambulatory Visit (INDEPENDENT_AMBULATORY_CARE_PROVIDER_SITE_OTHER): Payer: Medicare Other | Admitting: Vascular Surgery

## 2022-12-28 ENCOUNTER — Encounter (INDEPENDENT_AMBULATORY_CARE_PROVIDER_SITE_OTHER): Payer: Self-pay | Admitting: Vascular Surgery

## 2022-12-28 VITALS — BP 141/63 | HR 93 | Resp 16 | Wt 162.8 lb

## 2022-12-28 DIAGNOSIS — M159 Polyosteoarthritis, unspecified: Secondary | ICD-10-CM

## 2022-12-28 DIAGNOSIS — I10 Essential (primary) hypertension: Secondary | ICD-10-CM | POA: Diagnosis not present

## 2022-12-28 DIAGNOSIS — I872 Venous insufficiency (chronic) (peripheral): Secondary | ICD-10-CM | POA: Diagnosis not present

## 2022-12-28 DIAGNOSIS — E785 Hyperlipidemia, unspecified: Secondary | ICD-10-CM

## 2022-12-28 DIAGNOSIS — I890 Lymphedema, not elsewhere classified: Secondary | ICD-10-CM

## 2022-12-28 DIAGNOSIS — K219 Gastro-esophageal reflux disease without esophagitis: Secondary | ICD-10-CM

## 2022-12-31 ENCOUNTER — Encounter (INDEPENDENT_AMBULATORY_CARE_PROVIDER_SITE_OTHER): Payer: Self-pay | Admitting: Vascular Surgery

## 2022-12-31 DIAGNOSIS — I890 Lymphedema, not elsewhere classified: Secondary | ICD-10-CM | POA: Insufficient documentation

## 2022-12-31 NOTE — Progress Notes (Signed)
MRN : [id number]  [name] is a 88 y.o. ([id number]) female who presents with chief complaint of legs swell.  History of Present Illness:   The patient returns to the office for followup evaluation regarding leg swelling.  The swelling has improved quite a bit and the pain associated with swelling has decreased substantially. There have not been any interval development of a ulcerations or wounds.  Since the previous visit the patient has been wearing graduated compression stockings and has noted some improvement in the lymphedema. The patient has been using compression routinely morning until night.  The patient also states elevation during the day and exercise (such as walking) is being done too.    Current Meds  Medication Sig   ALPRAZolam (XANAX) 0.25 MG tablet Take 0.25 mg by mouth 2 (two) times daily as needed.    amLODipine (NORVASC) 5 MG tablet Take 0.5 tablets (2.5 mg total) by mouth daily. Pt states that she she increased the tablet to 5 mg daily. She was taking 2.5 mg. She stated that she thought the half tablet was not working efficiently. I asked the patient to inform her pcp that her dosage was increased.   Apoaequorin 10 MG CAPS Take 1 capsule by mouth daily.   Ascorbic Acid (VITAMIN C) 1000 MG tablet Take 1,000 mg by mouth daily.   aspirin EC 81 MG tablet Take 81 mg daily by mouth.   b complex vitamins capsule Take 1 capsule by mouth daily.   Biotin 1000 MCG tablet Take 1,000 mcg by mouth daily.    Cholecalciferol (VITAMIN D3) 2000 units capsule Take 2,000 Units by mouth daily.   Cyanocobalamin (B-12 PO) Take 2,500 mcg by mouth daily.   filgrastim-sndz (ZARXIO) 480 MCG/0.8ML SOSY injection Inject 0.8 mLs (480 mcg total) into the skin once a week. Administer injection subcutaneously   predniSONE (DELTASONE) 5 MG tablet Take 5 mg by mouth daily.   traMADol (ULTRAM) 50 MG tablet Take 50 mg by mouth daily.    Past Medical History:   Diagnosis Date   Breast cancer (HCC) 1985   bilateral mastectomies   Depression    1 time specific situation that she did not know how to deal wiht   Diverticulosis    Hiatal hernia    History of Bell's palsy    History of colon polyps    History of COVID-19    History of nephrolithiasis    History of pancreatitis    Hyperlipidemia    IBS (irritable bowel syndrome)    Osteoporosis    osteopenia in neck   Skin cancer    Temporal arteritis (HCC)     Past Surgical History:  Procedure Laterality Date   AUGMENTATION MAMMAPLASTY Bilateral 2000   COLON RESECTION SIGMOID N/A 10/13/2017   Procedure: COLON RESECTION SIGMOID;  Surgeon: Carolan Shiver, MD;  Location: [facility];  Service: General;  Laterality: N/A;   COLOSTOMY N/A 10/13/2017   Procedure: COLOSTOMY;  Surgeon: Carolan Shiver, MD;  Location: [facility];  Service: General;  Laterality: N/A;   LAPAROSCOPIC CHOLECYSTECTOMY  2009   MASTECTOMY  1983   bilateral    Social History Social History   Tobacco Use   Smoking status: Former    Current packs/day: 0.00    Average packs/day: 0.3 packs/day for 30.0 years (7.5 ttl pk-yrs)  Types: Cigarettes    Start date: 05/18/1975    Quit date: 05/17/2005    Years since quitting: 17.6   Smokeless tobacco: Never   Tobacco comments:    quit 11 years ago  Vaping Use   Vaping status: Never Used  Substance Use Topics   Alcohol use: No    Alcohol/week: 0.0 standard drinks of alcohol   Drug use: No    Family History Family History  Problem Relation Age of Onset   Colon cancer Mother 40   Scleroderma Father 68   Alcoholism Brother    Stomach cancer Neg Hx     Allergies  Allergen Reactions   Morphine Other (See Comments)    "done not work, makes me scream."   Benzalkonium Chloride Dermatitis    Other reaction(s): Unknown   Escitalopram     Deveioped a twitch   Hydralazine     Causes sx of heart pounding   Neomycin-Bacitracin Zn-Polymyx Dermatitis    Other  reaction(s): Other (See Comments) REACTION: blisters skin REACTION: blisters skin REACTION: blisters skin   Bacitracin-Neomycin-Polymyxin Dermatitis    REACTION: blisters skin     REVIEW OF SYSTEMS (Negative unless checked)  Constitutional: [] Weight loss  [] Fever  [] Chills Cardiac: [] Chest pain   [] Chest pressure   [] Palpitations   [] Shortness of breath when laying flat   [] Shortness of breath with exertion. Vascular:  [] Pain in legs with walking   [x] Pain in legs with standing  [] History of DVT   [] Phlebitis   [x] Swelling in legs   [] Varicose veins   [] Non-healing ulcers Pulmonary:   [] Uses home oxygen   [] Productive cough   [] Hemoptysis   [] Wheeze  [] COPD   [] Asthma Neurologic:  [] Dizziness   [] Seizures   [] History of stroke   [] History of TIA  [] Aphasia   [] Vissual changes   [] Weakness or numbness in arm   [] Weakness or numbness in leg Musculoskeletal:   [] Joint swelling   [x] Joint pain   [] Low back pain Hematologic:  [] Easy bruising  [] Easy bleeding   [] Hypercoagulable state   [] Anemic Gastrointestinal:  [] Diarrhea   [] Vomiting  [x] Gastroesophageal reflux/heartburn   [] Difficulty swallowing. Genitourinary:  [] Chronic kidney disease   [] Difficult urination  [] Frequent urination   [] Blood in urine Skin:  [] Rashes   [] Ulcers  Psychological:  [] History of anxiety   []  History of major depression.  Physical Examination  Vitals:   12/28/22 1448  BP: (!) 141/63  Pulse: 93  Resp: 16  Weight: 162 lb 12.8 oz (73.8 kg)   Body mass index is 27.09 kg/m. Gen: WD/WN, NAD Head: /AT, No temporalis wasting.  Ear/Nose/Throat: Hearing grossly intact, nares w/o erythema or drainage, pinna without lesions Eyes: PER, EOMI, sclera nonicteric.  Neck: Supple, no gross masses.  No JVD.  Pulmonary:  Good air movement, no audible wheezing, no use of accessory muscles.  Cardiac: RRR, precordium not hyperdynamic. Vascular:  scattered varicosities present bilaterally.  Mild venous stasis changes to  the legs bilaterally.  2+ soft pitting edema, CEAP [id number]  Vessel Right Left  Radial Palpable Palpable  Gastrointestinal: soft, non-distended. No guarding/no peritoneal signs.  Musculoskeletal: M/S 5/5 throughout.  No deformity.  Neurologic: CN 2-12 intact. Pain and light touch intact in extremities.  Symmetrical.  Speech is fluent. Motor exam as listed above. Psychiatric: Judgment intact, Mood & affect appropriate for pt's clinical situation. Dermatologic: Venous rashes no ulcers noted.  No changes consistent with cellulitis. Lymph : No lichenification or skin changes of chronic lymphedema.  CBC Lab  Results  Component Value Date   WBC 2.9 (L) 12/26/2022   HGB 14.3 12/26/2022   HCT 46.2 (H) 12/26/2022   MCV 92.2 12/26/2022   PLT 252 12/26/2022    BMET    Component Value Date/Time   NA 139 11/28/2022 1009   NA 142 10/31/2022 1426   NA 140 08/12/2012 0430   K 4.0 11/28/2022 1009   K 3.9 08/12/2012 0430   CL 102 11/28/2022 1009   CL 107 08/12/2012 0430   CO2 29 11/28/2022 1009   CO2 28 08/12/2012 0430   GLUCOSE 91 11/28/2022 1009   GLUCOSE 80 08/12/2012 0430   BUN 14 11/28/2022 1009   BUN 18 10/31/2022 1426   BUN 11 08/12/2012 0430   CREATININE 1.03 (H) 11/28/2022 1009   CREATININE 0.78 08/12/2012 0430   CALCIUM 8.9 11/28/2022 1009   CALCIUM 7.9 (L) 08/12/2012 0430   GFRNONAA 53 (L) 11/28/2022 1009   GFRNONAA >60 08/12/2012 0430   GFRAA 58 (L) 02/17/2020 0955   GFRAA >60 08/12/2012 0430   CrCl cannot be calculated (Patient's most recent lab result is older than the maximum 21 days allowed.).  COAG Lab Results  Component Value Date   INR 0.92 09/21/2017   INR 0.87 09/04/2017   INR 0.9 08/11/2012    Radiology MR ABDOMEN MRCP W WO CONTAST  Result Date: 12/10/2022 CLINICAL DATA:  Follow-up acute pancreatitis EXAM: MRI ABDOMEN WITHOUT AND WITH CONTRAST (INCLUDING MRCP) TECHNIQUE: Multiplanar multisequence MR imaging of the abdomen was performed both before and  after the administration of intravenous contrast. Heavily T2-weighted images of the biliary and pancreatic ducts were obtained, and three-dimensional MRCP images were rendered by post processing. CONTRAST:  7.4mL GADAVIST GADOBUTROL 1 MMOL/ML IV SOLN COMPARISON:  CT abdomen pelvis, 10/09/2018 FINDINGS: Lower chest: No acute abnormality. Bilateral breast implants. Cardiomegaly. Hepatobiliary: No focal liver abnormality is seen. Status post cholecystectomy. Unchanged, mild postoperative biliary ductal dilatation. Pancreas: Interval resolution of previously seen acute inflammatory fluid and fat stranding about the pancreatic tail (series 4, image 16). No pancreatic ductal dilatation or surrounding inflammatory changes. Spleen: Normal in size without significant abnormality. Adrenals/Urinary Tract: Adrenal glands are unremarkable. Kidneys are normal, without obvious renal calculi, solid lesion, or hydronephrosis. Stomach/Bowel: Stomach is within normal limits. No evidence of bowel wall thickening, distention, or inflammatory changes. Vascular/Lymphatic: Aortic atherosclerosis. No enlarged abdominal lymph nodes. Other: No abdominal wall hernia or abnormality. No ascites. Musculoskeletal: No acute or significant osseous findings. IMPRESSION: 1. Interval resolution of previously seen acute inflammatory fluid and fat stranding about the pancreatic tail. No pancreatic ductal dilatation or surrounding inflammatory changes. No underlying mass or suspicious contrast enhancement. 2. Status post cholecystectomy. Unchanged, mild postoperative biliary ductal dilatation. 3. Cardiomegaly. Aortic Atherosclerosis (ICD10-I70.0). Electronically Signed   By: Jearld Lesch M.D.   On: 12/10/2022 15:30   MR 3D Recon At Scanner  Result Date: 12/10/2022 CLINICAL DATA:  Follow-up acute pancreatitis EXAM: MRI ABDOMEN WITHOUT AND WITH CONTRAST (INCLUDING MRCP) TECHNIQUE: Multiplanar multisequence MR imaging of the abdomen was performed both  before and after the administration of intravenous contrast. Heavily T2-weighted images of the biliary and pancreatic ducts were obtained, and three-dimensional MRCP images were rendered by post processing. CONTRAST:  7.6mL GADAVIST GADOBUTROL 1 MMOL/ML IV SOLN COMPARISON:  CT abdomen pelvis, 10/09/2018 FINDINGS: Lower chest: No acute abnormality. Bilateral breast implants. Cardiomegaly. Hepatobiliary: No focal liver abnormality is seen. Status post cholecystectomy. Unchanged, mild postoperative biliary ductal dilatation. Pancreas: Interval resolution of previously seen acute inflammatory fluid and  fat stranding about the pancreatic tail (series 4, image 16). No pancreatic ductal dilatation or surrounding inflammatory changes. Spleen: Normal in size without significant abnormality. Adrenals/Urinary Tract: Adrenal glands are unremarkable. Kidneys are normal, without obvious renal calculi, solid lesion, or hydronephrosis. Stomach/Bowel: Stomach is within normal limits. No evidence of bowel wall thickening, distention, or inflammatory changes. Vascular/Lymphatic: Aortic atherosclerosis. No enlarged abdominal lymph nodes. Other: No abdominal wall hernia or abnormality. No ascites. Musculoskeletal: No acute or significant osseous findings. IMPRESSION: 1. Interval resolution of previously seen acute inflammatory fluid and fat stranding about the pancreatic tail. No pancreatic ductal dilatation or surrounding inflammatory changes. No underlying mass or suspicious contrast enhancement. 2. Status post cholecystectomy. Unchanged, mild postoperative biliary ductal dilatation. 3. Cardiomegaly. Aortic Atherosclerosis (ICD10-I70.0). Electronically Signed   By: Jearld Lesch M.D.   On: 12/10/2022 15:30     Assessment/Plan 1. Lymphedema Recommend:  No surgery or intervention at this point in time.  I have reviewed my discussion with the patient regarding venous insufficiency and why it causes symptoms. I have discussed  with the patient the chronic skin changes that accompany venous insufficiency and the long term sequela such as ulceration. Patient will contnue wearing graduated compression stockings on a daily basis, as this has provided excellent control of his edema. The patient will put the stockings on first thing in the morning and removing them in the evening. The patient is reminded not to sleep in the stockings.  In addition, behavioral modification including elevation during the day will be initiated. Exercise is strongly encouraged.  Previous duplex ultrasound of the lower extremities shows normal deep system, no significant superficial reflux was identified.  Given the patient's good control and lack of any problems regarding the venous insufficiency and lymphedema a lymph pump in not need at this time.    The patient will follow up with me PRN should anything change.  The patient voices agreement with this plan.  2. Chronic venous insufficiency Recommend:  No surgery or intervention at this point in time.  I have reviewed my discussion with the patient regarding venous insufficiency and why it causes symptoms. I have discussed with the patient the chronic skin changes that accompany venous insufficiency and the long term sequela such as ulceration. Patient will contnue wearing graduated compression stockings on a daily basis, as this has provided excellent control of his edema. The patient will put the stockings on first thing in the morning and removing them in the evening. The patient is reminded not to sleep in the stockings.  In addition, behavioral modification including elevation during the day will be initiated. Exercise is strongly encouraged.  Previous duplex ultrasound of the lower extremities shows normal deep system, no significant superficial reflux was identified.  Given the patient's good control and lack of any problems regarding the venous insufficiency and lymphedema a lymph  pump in not need at this time.    The patient will follow up with me PRN should anything change.  The patient voices agreement with this plan.  3. Primary hypertension Continue antihypertensive medications as already ordered, these medications have been reviewed and there are no changes at this time.  4. Gastroesophageal reflux disease without esophagitis Continue PPI as already ordered, this medication has been reviewed and there are no changes at this time.  Avoidence of caffeine and alcohol  Moderate elevation of the head of the bed   5. Primary osteoarthritis involving multiple joints Continue NSAID medications as already ordered, these medications have been  reviewed and there are no changes at this time.  Continued activity and therapy was stressed.  6. Hyperlipidemia, unspecified hyperlipidemia type Continue statin as ordered and reviewed, no changes at this time    Levora Dredge, MD  12/31/2022 1:43 PM

## 2023-01-01 ENCOUNTER — Ambulatory Visit: Payer: Medicare Other | Admitting: Physician Assistant

## 2023-01-02 ENCOUNTER — Inpatient Hospital Stay: Payer: Medicare Other

## 2023-01-02 ENCOUNTER — Inpatient Hospital Stay: Payer: Medicare Other | Attending: Internal Medicine

## 2023-01-02 DIAGNOSIS — Z8616 Personal history of COVID-19: Secondary | ICD-10-CM | POA: Diagnosis not present

## 2023-01-02 DIAGNOSIS — D708 Other neutropenia: Secondary | ICD-10-CM | POA: Insufficient documentation

## 2023-01-02 DIAGNOSIS — F419 Anxiety disorder, unspecified: Secondary | ICD-10-CM | POA: Diagnosis not present

## 2023-01-02 DIAGNOSIS — Z811 Family history of alcohol abuse and dependence: Secondary | ICD-10-CM | POA: Insufficient documentation

## 2023-01-02 DIAGNOSIS — M316 Other giant cell arteritis: Secondary | ICD-10-CM | POA: Insufficient documentation

## 2023-01-02 DIAGNOSIS — Z840 Family history of diseases of the skin and subcutaneous tissue: Secondary | ICD-10-CM | POA: Insufficient documentation

## 2023-01-02 DIAGNOSIS — Z79899 Other long term (current) drug therapy: Secondary | ICD-10-CM | POA: Insufficient documentation

## 2023-01-02 DIAGNOSIS — Z87891 Personal history of nicotine dependence: Secondary | ICD-10-CM | POA: Insufficient documentation

## 2023-01-02 DIAGNOSIS — Z800 Family history of malignant neoplasm of digestive organs: Secondary | ICD-10-CM | POA: Diagnosis not present

## 2023-01-02 DIAGNOSIS — D72819 Decreased white blood cell count, unspecified: Secondary | ICD-10-CM

## 2023-01-02 LAB — CBC WITH DIFFERENTIAL (CANCER CENTER ONLY)
Abs Immature Granulocytes: 0.17 10*3/uL — ABNORMAL HIGH (ref 0.00–0.07)
Basophils Absolute: 0 10*3/uL (ref 0.0–0.1)
Basophils Relative: 1 %
Eosinophils Absolute: 0 10*3/uL (ref 0.0–0.5)
Eosinophils Relative: 0 %
HCT: 43.8 % (ref 36.0–46.0)
Hemoglobin: 13.8 g/dL (ref 12.0–15.0)
Immature Granulocytes: 7 %
Lymphocytes Relative: 27 %
Lymphs Abs: 0.6 10*3/uL — ABNORMAL LOW (ref 0.7–4.0)
MCH: 28.7 pg (ref 26.0–34.0)
MCHC: 31.5 g/dL (ref 30.0–36.0)
MCV: 91.1 fL (ref 80.0–100.0)
Monocytes Absolute: 0.2 10*3/uL (ref 0.1–1.0)
Monocytes Relative: 8 %
Neutro Abs: 1.3 10*3/uL — ABNORMAL LOW (ref 1.7–7.7)
Neutrophils Relative %: 57 %
Platelet Count: 243 10*3/uL (ref 150–400)
RBC: 4.81 MIL/uL (ref 3.87–5.11)
RDW: 14.9 % (ref 11.5–15.5)
Smear Review: NORMAL
WBC Count: 2.3 10*3/uL — ABNORMAL LOW (ref 4.0–10.5)
nRBC: 0 % (ref 0.0–0.2)

## 2023-01-02 MED ORDER — FILGRASTIM-SNDZ 480 MCG/0.8ML IJ SOSY
480.0000 ug | PREFILLED_SYRINGE | Freq: Once | INTRAMUSCULAR | Status: AC
  Administered 2023-01-02: 480 ug via SUBCUTANEOUS

## 2023-01-03 ENCOUNTER — Inpatient Hospital Stay: Payer: Medicare Other

## 2023-01-03 ENCOUNTER — Other Ambulatory Visit: Payer: Self-pay

## 2023-01-03 ENCOUNTER — Other Ambulatory Visit: Payer: Medicare Other

## 2023-01-05 ENCOUNTER — Encounter: Payer: Self-pay | Admitting: Physician Assistant

## 2023-01-05 ENCOUNTER — Ambulatory Visit: Payer: Medicare Other | Admitting: Physician Assistant

## 2023-01-05 VITALS — BP 130/73 | HR 92 | Temp 97.7°F | Ht 66.0 in | Wt 157.6 lb

## 2023-01-05 DIAGNOSIS — Z8719 Personal history of other diseases of the digestive system: Secondary | ICD-10-CM

## 2023-01-05 DIAGNOSIS — R748 Abnormal levels of other serum enzymes: Secondary | ICD-10-CM | POA: Diagnosis not present

## 2023-01-05 NOTE — Progress Notes (Signed)
[name], PA-C [address]  [address]  [place], [place] [address]  Main: [phone]  Fax: [phone]   Gastroenterology Consultation  Referring Provider:     Gracelyn Nurse, MD Primary Care Physician:  Gracelyn Nurse, MD Primary Gastroenterologist:  [name], PA-C / Dr. Wyline Mood   Reason for Consultation:     F/U Pancreatitis        HPI:   [name] is a 88 y.o. y/o female referred for consultation & management  by Gracelyn Nurse, MD.    Lipid panel 01/03/2023 showed normal triglycerides 70, total cholesterol 187, LDL 106. Lab 10/2022 showed normal triglycerides and IgG4.   Currently feeling well with no GI symptoms.  Denies abdominal pain, nausea, vomiting, or weight loss.  Abdominal MRI / MRCP 12/05/22 (to f/u acute pancreatitis): 1. Interval resolution of previously seen acute inflammatory fluid and fat stranding about the pancreatic tail. No pancreatic ductal dilatation or surrounding inflammatory changes. No underlying mass or suspicious contrast enhancement. 2. Status post cholecystectomy. Unchanged, mild postoperative biliary ductal dilatation. 3. Cardiomegaly.  Abd / Pelvic CT 10/09/22 (to eval LLQ Pain):  1. Inflammatory changes adjacent to the distal body and tail of the pancreas with fluid tracking caudally along the left side of the retroperitoneum, compatible with acute pancreatitis. No definite complicating features (i.e., no definite pancreatic necrosis or pseudocyst formation at this time).  Prior Cholecystectomy.  CBD 10mm. 2. Multiple nonobstructive calculi in the collecting systems of both kidneys measuring up to 5 mm in the upper pole collecting system of the left kidney. No ureteral stones or findings of urinary tract obstruction are noted at this time. 3. Aortic atherosclerosis with ectasia of the infrarenal abdominal aorta which measures up to 2.5 x 2.2 cm. 4. Small hiatal hernia.  She has been followed for  abdominal pain felt secondary to constipation.  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  Subsequent CT scan of the abdomen that showed the cyst that resolved.     Interval history 05/2022-12/2022   Admitted on 10/09/2022 and discharged 2 days later when she was admitted with pancreatitis.  First episode but has had gallstone pancreatitis 5 to 6 years back when she had a cholecystectomy.  Treated conservatively and sent home.  Lipase was 290.  Acute pancreatitis noted on CAT scan.  No elevation of transaminases bilirubin was 2.2.   Since that she has had no abdominal pain.  Denies any use of any new medications no herbal supplements no excess alcohol consumption pain all began right after meal central abdominal severe enough to call an ambulance.  Past Medical History:  Diagnosis Date   Breast cancer (HCC) 1985   bilateral mastectomies   Depression    1 time specific situation that she did not know how to deal wiht   Diverticulosis    Hiatal hernia    History of Bell's palsy    History of colon polyps    History of COVID-19    History of nephrolithiasis    History of pancreatitis    Hyperlipidemia    IBS (irritable bowel syndrome)    Osteoporosis    osteopenia in neck   Skin cancer    Temporal arteritis [facility])     Past Surgical  History:  Procedure Laterality Date   AUGMENTATION MAMMAPLASTY Bilateral 2000   COLON RESECTION SIGMOID N/A 10/13/2017   Procedure: COLON RESECTION SIGMOID;  Surgeon: Carolan Shiver, MD;  Location: [facility] ORS;  Service: General;  Laterality: N/A;   COLOSTOMY N/A 10/13/2017   Procedure: COLOSTOMY;  Surgeon: Carolan Shiver, MD;  Location: [facility] ORS;  Service: General;  Laterality: N/A;   LAPAROSCOPIC CHOLECYSTECTOMY  2009    MASTECTOMY  1983   bilateral    Prior to Admission medications   Medication Sig Start Date End Date Taking? Authorizing Provider  ALPRAZolam (XANAX) 0.25 MG tablet Take 0.25 mg by mouth 2 (two) times daily as needed.  11/03/10  Yes [provider]  amLODipine (NORVASC) 5 MG tablet Take 0.5 tablets (2.5 mg total) by mouth daily. Pt states that she she increased the tablet to 5 mg daily. She was taking 2.5 mg. She stated that she thought the half tablet was not working efficiently. I asked the patient to inform her pcp that her dosage was increased. 10/11/22  Yes Sunnie Nielsen, DO  Ascorbic Acid (VITAMIN C) 1000 MG tablet Take 1,000 mg by mouth daily.   Yes [provider]  aspirin EC 81 MG tablet Take 81 mg daily by mouth.   Yes [provider]  b complex vitamins capsule Take 1 capsule by mouth daily.   Yes [provider]  Biotin 1000 MCG tablet Take 1,000 mcg by mouth daily.    Yes [provider]  Cholecalciferol (VITAMIN D3) 2000 units capsule Take 2,000 Units by mouth daily.   Yes [provider]  Cyanocobalamin (B-12 PO) Take 2,500 mcg by mouth daily.   Yes [provider]  predniSONE (DELTASONE) 5 MG tablet Take 5 mg by mouth daily.   Yes [provider]  traMADol (ULTRAM) 50 MG tablet Take 50 mg by mouth daily. 11/21/10  Yes [provider]  ZINC OXIDE PO Take 1 tablet by mouth daily.   Yes [provider]    Family History  Problem Relation Age of Onset   Colon cancer Mother 37   Scleroderma Father 73   Alcoholism Brother    Stomach cancer Neg Hx      Social History   Tobacco Use   Smoking status: Former    Current packs/day: 0.00    Average packs/day: 0.3 packs/day for 30.0 years (7.5 ttl pk-yrs)    Types: Cigarettes    Start date: 05/18/1975    Quit date: 05/17/2005    Years since quitting: 17.6   Smokeless tobacco: Never   Tobacco comments:    quit 11 years ago  Vaping Use    Vaping status: Never Used  Substance Use Topics   Alcohol use: No    Alcohol/week: 0.0 standard drinks of alcohol   Drug use: No    Allergies as of 01/05/2023 - Review Complete 01/05/2023  Allergen Reaction Noted   Morphine Other (See Comments) 07/12/2018   Benzalkonium chloride Dermatitis 04/14/2014   Escitalopram  10/20/2014   Hydralazine  07/25/2018   Neomycin-bacitracin zn-polymyx Dermatitis 06/08/2008   Bacitracin-neomycin-polymyxin Dermatitis 06/08/2008    Review of Systems:    All systems reviewed and negative except where noted in HPI.   Physical Exam:  BP 130/73   Pulse 92   Temp 97.7 F (36.5 C)   Ht 5\' 6"  (1.676 m)   Wt 157 lb 9.6 oz (71.5 kg)   BMI 25.44 kg/m  No LMP recorded. Patient is postmenopausal. Psych:  Alert and cooperative. Normal mood and affect. General:   Alert,  Well-developed, well-nourished, pleasant and cooperative in NAD Head:  Normocephalic and atraumatic. Eyes:  Sclera clear, no icterus.   Conjunctiva pink. Lungs:  Respirations even and unlabored.  Clear throughout to auscultation.   No wheezes, crackles, or rhonchi. No acute distress. Heart:  Regular rate and rhythm; no murmurs, clicks, rubs, or gallops. Abdomen:  Normal bowel sounds.  No bruits.  Soft, and non-distended without masses, hepatosplenomegaly or hernias noted.  No Tenderness.  No guarding or rebound tenderness.    Neurologic:  Alert and oriented x3;  grossly normal neurologically. Psych:  Alert and cooperative. Normal mood and affect.  Imaging Studies: No results found.  Assessment and Plan:   [name] is a 88 y.o. y/o female has been referred for follow-up of acute uncomplicated pancreatitis of uncertain etiology.  Patient does not drink alcohol.  Labs showed normal triglycerides, normal IgG4.  Recent MRI MRCP showed no bile duct stone and no pancreas masses.  She has not been on any new medication.  The previous pancreatitis inflammation(seen on CT) resolved on  recent MRI.  Patient is currently asymptomatic and feeling a lot better.  Denies any abdominal pain.  1.  History of acute uncomplicated idiopathic pancreatitis - Resolved  Repeating Lipase to make sure it has normalized.  Reassurance.  Pt. Education given and discussed.   Follow up as needed if symptoms return.  [name], PA-C

## 2023-01-09 ENCOUNTER — Inpatient Hospital Stay: Payer: Medicare Other

## 2023-01-09 DIAGNOSIS — D708 Other neutropenia: Secondary | ICD-10-CM | POA: Diagnosis not present

## 2023-01-09 DIAGNOSIS — D72819 Decreased white blood cell count, unspecified: Secondary | ICD-10-CM

## 2023-01-09 LAB — CBC WITH DIFFERENTIAL (CANCER CENTER ONLY)
Abs Immature Granulocytes: 0.03 10*3/uL (ref 0.00–0.07)
Basophils Absolute: 0 10*3/uL (ref 0.0–0.1)
Basophils Relative: 1 %
Eosinophils Absolute: 0 10*3/uL (ref 0.0–0.5)
Eosinophils Relative: 1 %
HCT: 45.8 % (ref 36.0–46.0)
Hemoglobin: 14.1 g/dL (ref 12.0–15.0)
Immature Granulocytes: 1 %
Lymphocytes Relative: 55 %
Lymphs Abs: 1.2 10*3/uL (ref 0.7–4.0)
MCH: 28.6 pg (ref 26.0–34.0)
MCHC: 30.8 g/dL (ref 30.0–36.0)
MCV: 92.9 fL (ref 80.0–100.0)
Monocytes Absolute: 0.3 10*3/uL (ref 0.1–1.0)
Monocytes Relative: 12 %
Neutro Abs: 0.6 10*3/uL — ABNORMAL LOW (ref 1.7–7.7)
Neutrophils Relative %: 30 %
Platelet Count: 213 10*3/uL (ref 150–400)
RBC: 4.93 MIL/uL (ref 3.87–5.11)
RDW: 15.2 % (ref 11.5–15.5)
WBC Count: 2.1 10*3/uL — ABNORMAL LOW (ref 4.0–10.5)
nRBC: 0 % (ref 0.0–0.2)

## 2023-01-09 MED ORDER — FILGRASTIM-SNDZ 480 MCG/0.8ML IJ SOSY
480.0000 ug | PREFILLED_SYRINGE | Freq: Once | INTRAMUSCULAR | Status: AC
  Administered 2023-01-09: 480 ug via SUBCUTANEOUS
  Filled 2023-01-09: qty 0.8

## 2023-01-10 ENCOUNTER — Inpatient Hospital Stay: Payer: Medicare Other

## 2023-01-10 ENCOUNTER — Other Ambulatory Visit: Payer: Medicare Other

## 2023-01-10 DIAGNOSIS — I10 Essential (primary) hypertension: Secondary | ICD-10-CM | POA: Insufficient documentation

## 2023-01-10 HISTORY — DX: Essential (primary) hypertension: I10

## 2023-01-16 ENCOUNTER — Inpatient Hospital Stay: Payer: Medicare Other

## 2023-01-16 DIAGNOSIS — D708 Other neutropenia: Secondary | ICD-10-CM

## 2023-01-16 DIAGNOSIS — D72819 Decreased white blood cell count, unspecified: Secondary | ICD-10-CM

## 2023-01-16 LAB — CBC WITH DIFFERENTIAL (CANCER CENTER ONLY)
Abs Immature Granulocytes: 0.03 10*3/uL (ref 0.00–0.07)
Basophils Absolute: 0 10*3/uL (ref 0.0–0.1)
Basophils Relative: 1 %
Eosinophils Absolute: 0.1 10*3/uL (ref 0.0–0.5)
Eosinophils Relative: 2 %
HCT: 44.2 % (ref 36.0–46.0)
Hemoglobin: 13.8 g/dL (ref 12.0–15.0)
Immature Granulocytes: 1 %
Lymphocytes Relative: 41 %
Lymphs Abs: 1 10*3/uL (ref 0.7–4.0)
MCH: 29 pg (ref 26.0–34.0)
MCHC: 31.2 g/dL (ref 30.0–36.0)
MCV: 92.9 fL (ref 80.0–100.0)
Monocytes Absolute: 0.3 10*3/uL (ref 0.1–1.0)
Monocytes Relative: 13 %
Neutro Abs: 1 10*3/uL — ABNORMAL LOW (ref 1.7–7.7)
Neutrophils Relative %: 42 %
Platelet Count: 199 10*3/uL (ref 150–400)
RBC: 4.76 MIL/uL (ref 3.87–5.11)
RDW: 15.2 % (ref 11.5–15.5)
Smear Review: ADEQUATE
WBC Count: 2.4 10*3/uL — ABNORMAL LOW (ref 4.0–10.5)
nRBC: 0 % (ref 0.0–0.2)

## 2023-01-16 MED ORDER — FILGRASTIM-SNDZ 480 MCG/0.8ML IJ SOSY
480.0000 ug | PREFILLED_SYRINGE | Freq: Once | INTRAMUSCULAR | Status: AC
  Administered 2023-01-16: 480 ug via SUBCUTANEOUS
  Filled 2023-01-16: qty 0.8

## 2023-01-17 ENCOUNTER — Other Ambulatory Visit: Payer: Medicare Other

## 2023-01-17 ENCOUNTER — Inpatient Hospital Stay: Payer: Medicare Other

## 2023-01-23 ENCOUNTER — Inpatient Hospital Stay: Payer: Medicare Other

## 2023-01-23 DIAGNOSIS — D708 Other neutropenia: Secondary | ICD-10-CM

## 2023-01-23 DIAGNOSIS — D72819 Decreased white blood cell count, unspecified: Secondary | ICD-10-CM

## 2023-01-23 LAB — CBC WITH DIFFERENTIAL (CANCER CENTER ONLY)
Abs Immature Granulocytes: 0.14 10*3/uL — ABNORMAL HIGH (ref 0.00–0.07)
Basophils Absolute: 0 10*3/uL (ref 0.0–0.1)
Basophils Relative: 1 %
Eosinophils Absolute: 0 10*3/uL (ref 0.0–0.5)
Eosinophils Relative: 1 %
HCT: 45.7 % (ref 36.0–46.0)
Hemoglobin: 14.3 g/dL (ref 12.0–15.0)
Immature Granulocytes: 6 %
Lymphocytes Relative: 32 %
Lymphs Abs: 0.8 10*3/uL (ref 0.7–4.0)
MCH: 28.9 pg (ref 26.0–34.0)
MCHC: 31.3 g/dL (ref 30.0–36.0)
MCV: 92.3 fL (ref 80.0–100.0)
Monocytes Absolute: 0.3 10*3/uL (ref 0.1–1.0)
Monocytes Relative: 12 %
Neutro Abs: 1.2 10*3/uL — ABNORMAL LOW (ref 1.7–7.7)
Neutrophils Relative %: 48 %
Platelet Count: 212 10*3/uL (ref 150–400)
RBC: 4.95 MIL/uL (ref 3.87–5.11)
RDW: 15.2 % (ref 11.5–15.5)
Smear Review: NORMAL
WBC Count: 2.5 10*3/uL — ABNORMAL LOW (ref 4.0–10.5)
nRBC: 0 % (ref 0.0–0.2)

## 2023-01-23 MED ORDER — FILGRASTIM-SNDZ 480 MCG/0.8ML IJ SOSY
480.0000 ug | PREFILLED_SYRINGE | Freq: Once | INTRAMUSCULAR | Status: AC
  Administered 2023-01-23: 480 ug via SUBCUTANEOUS
  Filled 2023-01-23: qty 0.8

## 2023-01-24 ENCOUNTER — Other Ambulatory Visit: Payer: Medicare Other

## 2023-01-24 ENCOUNTER — Inpatient Hospital Stay: Payer: Medicare Other

## 2023-01-30 ENCOUNTER — Inpatient Hospital Stay: Payer: Medicare Other | Attending: Internal Medicine

## 2023-01-30 ENCOUNTER — Inpatient Hospital Stay: Payer: Medicare Other

## 2023-01-30 DIAGNOSIS — Z8269 Family history of other diseases of the musculoskeletal system and connective tissue: Secondary | ICD-10-CM | POA: Diagnosis not present

## 2023-01-30 DIAGNOSIS — Z87891 Personal history of nicotine dependence: Secondary | ICD-10-CM | POA: Diagnosis not present

## 2023-01-30 DIAGNOSIS — Z853 Personal history of malignant neoplasm of breast: Secondary | ICD-10-CM | POA: Diagnosis not present

## 2023-01-30 DIAGNOSIS — Z9049 Acquired absence of other specified parts of digestive tract: Secondary | ICD-10-CM | POA: Insufficient documentation

## 2023-01-30 DIAGNOSIS — Z9013 Acquired absence of bilateral breasts and nipples: Secondary | ICD-10-CM | POA: Insufficient documentation

## 2023-01-30 DIAGNOSIS — M359 Systemic involvement of connective tissue, unspecified: Secondary | ICD-10-CM | POA: Insufficient documentation

## 2023-01-30 DIAGNOSIS — R030 Elevated blood-pressure reading, without diagnosis of hypertension: Secondary | ICD-10-CM | POA: Insufficient documentation

## 2023-01-30 DIAGNOSIS — Z800 Family history of malignant neoplasm of digestive organs: Secondary | ICD-10-CM | POA: Insufficient documentation

## 2023-01-30 DIAGNOSIS — D708 Other neutropenia: Secondary | ICD-10-CM | POA: Insufficient documentation

## 2023-01-30 DIAGNOSIS — E785 Hyperlipidemia, unspecified: Secondary | ICD-10-CM | POA: Diagnosis not present

## 2023-01-30 DIAGNOSIS — Z885 Allergy status to narcotic agent status: Secondary | ICD-10-CM | POA: Insufficient documentation

## 2023-01-30 DIAGNOSIS — Z8719 Personal history of other diseases of the digestive system: Secondary | ICD-10-CM | POA: Diagnosis not present

## 2023-01-30 DIAGNOSIS — Z79899 Other long term (current) drug therapy: Secondary | ICD-10-CM | POA: Insufficient documentation

## 2023-01-30 DIAGNOSIS — Z87442 Personal history of urinary calculi: Secondary | ICD-10-CM | POA: Insufficient documentation

## 2023-01-30 DIAGNOSIS — Z811 Family history of alcohol abuse and dependence: Secondary | ICD-10-CM | POA: Diagnosis not present

## 2023-01-30 DIAGNOSIS — Z85828 Personal history of other malignant neoplasm of skin: Secondary | ICD-10-CM | POA: Insufficient documentation

## 2023-01-30 DIAGNOSIS — D72819 Decreased white blood cell count, unspecified: Secondary | ICD-10-CM

## 2023-01-30 LAB — CBC WITH DIFFERENTIAL (CANCER CENTER ONLY)
Abs Immature Granulocytes: 0.04 10*3/uL (ref 0.00–0.07)
Basophils Absolute: 0 10*3/uL (ref 0.0–0.1)
Basophils Relative: 1 %
Eosinophils Absolute: 0.1 10*3/uL (ref 0.0–0.5)
Eosinophils Relative: 2 %
HCT: 43 % (ref 36.0–46.0)
Hemoglobin: 13.3 g/dL (ref 12.0–15.0)
Immature Granulocytes: 1 %
Lymphocytes Relative: 39 %
Lymphs Abs: 1.1 10*3/uL (ref 0.7–4.0)
MCH: 28.9 pg (ref 26.0–34.0)
MCHC: 30.9 g/dL (ref 30.0–36.0)
MCV: 93.5 fL (ref 80.0–100.0)
Monocytes Absolute: 0.5 10*3/uL (ref 0.1–1.0)
Monocytes Relative: 19 %
Neutro Abs: 1.1 10*3/uL — ABNORMAL LOW (ref 1.7–7.7)
Neutrophils Relative %: 38 %
Platelet Count: 203 10*3/uL (ref 150–400)
RBC: 4.6 MIL/uL (ref 3.87–5.11)
RDW: 15.8 % — ABNORMAL HIGH (ref 11.5–15.5)
Smear Review: NORMAL
WBC Count: 2.8 10*3/uL — ABNORMAL LOW (ref 4.0–10.5)
nRBC: 0 % (ref 0.0–0.2)

## 2023-01-30 MED ORDER — FILGRASTIM-SNDZ 480 MCG/0.8ML IJ SOSY
480.0000 ug | PREFILLED_SYRINGE | Freq: Once | INTRAMUSCULAR | Status: AC
  Administered 2023-01-30: 480 ug via SUBCUTANEOUS
  Filled 2023-01-30: qty 0.8

## 2023-01-31 ENCOUNTER — Inpatient Hospital Stay: Payer: Medicare Other

## 2023-01-31 ENCOUNTER — Other Ambulatory Visit: Payer: Medicare Other

## 2023-02-06 ENCOUNTER — Inpatient Hospital Stay: Payer: Medicare Other

## 2023-02-06 DIAGNOSIS — D708 Other neutropenia: Secondary | ICD-10-CM | POA: Diagnosis not present

## 2023-02-06 DIAGNOSIS — D72819 Decreased white blood cell count, unspecified: Secondary | ICD-10-CM

## 2023-02-06 LAB — CBC WITH DIFFERENTIAL (CANCER CENTER ONLY)
Abs Immature Granulocytes: 0.03 10*3/uL (ref 0.00–0.07)
Basophils Absolute: 0 10*3/uL (ref 0.0–0.1)
Basophils Relative: 1 %
Eosinophils Absolute: 0.1 10*3/uL (ref 0.0–0.5)
Eosinophils Relative: 3 %
HCT: 46.3 % — ABNORMAL HIGH (ref 36.0–46.0)
Hemoglobin: 14.1 g/dL (ref 12.0–15.0)
Immature Granulocytes: 1 %
Lymphocytes Relative: 53 %
Lymphs Abs: 1.4 10*3/uL (ref 0.7–4.0)
MCH: 28.7 pg (ref 26.0–34.0)
MCHC: 30.5 g/dL (ref 30.0–36.0)
MCV: 94.1 fL (ref 80.0–100.0)
Monocytes Absolute: 0.3 10*3/uL (ref 0.1–1.0)
Monocytes Relative: 12 %
Neutro Abs: 0.8 10*3/uL — ABNORMAL LOW (ref 1.7–7.7)
Neutrophils Relative %: 30 %
Platelet Count: 227 10*3/uL (ref 150–400)
RBC: 4.92 MIL/uL (ref 3.87–5.11)
RDW: 15.6 % — ABNORMAL HIGH (ref 11.5–15.5)
Smear Review: ADEQUATE
WBC Count: 2.6 10*3/uL — ABNORMAL LOW (ref 4.0–10.5)
nRBC: 0 % (ref 0.0–0.2)

## 2023-02-06 MED ORDER — FILGRASTIM-SNDZ 480 MCG/0.8ML IJ SOSY
480.0000 ug | PREFILLED_SYRINGE | Freq: Once | INTRAMUSCULAR | Status: AC
  Administered 2023-02-06: 480 ug via SUBCUTANEOUS
  Filled 2023-02-06: qty 0.8

## 2023-02-07 ENCOUNTER — Other Ambulatory Visit: Payer: Medicare Other

## 2023-02-07 ENCOUNTER — Inpatient Hospital Stay: Payer: Medicare Other

## 2023-02-13 ENCOUNTER — Inpatient Hospital Stay: Payer: Medicare Other

## 2023-02-13 DIAGNOSIS — D708 Other neutropenia: Secondary | ICD-10-CM | POA: Diagnosis not present

## 2023-02-13 DIAGNOSIS — D72819 Decreased white blood cell count, unspecified: Secondary | ICD-10-CM

## 2023-02-13 LAB — CBC WITH DIFFERENTIAL (CANCER CENTER ONLY)
Abs Immature Granulocytes: 0 10*3/uL (ref 0.00–0.07)
Basophils Absolute: 0 10*3/uL (ref 0.0–0.1)
Basophils Relative: 1 %
Eosinophils Absolute: 0 10*3/uL (ref 0.0–0.5)
Eosinophils Relative: 1 %
HCT: 46 % (ref 36.0–46.0)
Hemoglobin: 14.2 g/dL (ref 12.0–15.0)
Immature Granulocytes: 0 %
Lymphocytes Relative: 39 %
Lymphs Abs: 1.1 10*3/uL (ref 0.7–4.0)
MCH: 28.9 pg (ref 26.0–34.0)
MCHC: 30.9 g/dL (ref 30.0–36.0)
MCV: 93.7 fL (ref 80.0–100.0)
Monocytes Absolute: 0.4 10*3/uL (ref 0.1–1.0)
Monocytes Relative: 13 %
Neutro Abs: 1.3 10*3/uL — ABNORMAL LOW (ref 1.7–7.7)
Neutrophils Relative %: 46 %
Platelet Count: 210 10*3/uL (ref 150–400)
RBC: 4.91 MIL/uL (ref 3.87–5.11)
RDW: 15.8 % — ABNORMAL HIGH (ref 11.5–15.5)
Smear Review: NORMAL
WBC Count: 2.9 10*3/uL — ABNORMAL LOW (ref 4.0–10.5)
nRBC: 0 % (ref 0.0–0.2)

## 2023-02-13 MED ORDER — FILGRASTIM-SNDZ 480 MCG/0.8ML IJ SOSY
480.0000 ug | PREFILLED_SYRINGE | Freq: Once | INTRAMUSCULAR | Status: AC
  Administered 2023-02-13: 480 ug via SUBCUTANEOUS
  Filled 2023-02-13: qty 0.8

## 2023-02-14 ENCOUNTER — Inpatient Hospital Stay: Payer: Medicare Other

## 2023-02-15 NOTE — Telephone Encounter (Signed)
error 

## 2023-02-20 ENCOUNTER — Inpatient Hospital Stay: Payer: Medicare Other

## 2023-02-20 ENCOUNTER — Inpatient Hospital Stay (HOSPITAL_BASED_OUTPATIENT_CLINIC_OR_DEPARTMENT_OTHER): Payer: Medicare Other | Admitting: Internal Medicine

## 2023-02-20 ENCOUNTER — Encounter: Payer: Self-pay | Admitting: Internal Medicine

## 2023-02-20 DIAGNOSIS — D708 Other neutropenia: Secondary | ICD-10-CM

## 2023-02-20 DIAGNOSIS — D72819 Decreased white blood cell count, unspecified: Secondary | ICD-10-CM

## 2023-02-20 LAB — CBC WITH DIFFERENTIAL (CANCER CENTER ONLY)
Abs Immature Granulocytes: 0.02 10*3/uL (ref 0.00–0.07)
Basophils Absolute: 0 10*3/uL (ref 0.0–0.1)
Basophils Relative: 2 %
Eosinophils Absolute: 0.1 10*3/uL (ref 0.0–0.5)
Eosinophils Relative: 4 %
HCT: 45.8 % (ref 36.0–46.0)
Hemoglobin: 14.2 g/dL (ref 12.0–15.0)
Immature Granulocytes: 1 %
Lymphocytes Relative: 54 %
Lymphs Abs: 1.4 10*3/uL (ref 0.7–4.0)
MCH: 28.9 pg (ref 26.0–34.0)
MCHC: 31 g/dL (ref 30.0–36.0)
MCV: 93.1 fL (ref 80.0–100.0)
Monocytes Absolute: 0.4 10*3/uL (ref 0.1–1.0)
Monocytes Relative: 15 %
Neutro Abs: 0.6 10*3/uL — ABNORMAL LOW (ref 1.7–7.7)
Neutrophils Relative %: 24 %
Platelet Count: 218 10*3/uL (ref 150–400)
RBC: 4.92 MIL/uL (ref 3.87–5.11)
RDW: 15.9 % — ABNORMAL HIGH (ref 11.5–15.5)
Smear Review: ADEQUATE
WBC Count: 2.6 10*3/uL — ABNORMAL LOW (ref 4.0–10.5)
nRBC: 0 % (ref 0.0–0.2)

## 2023-02-20 MED ORDER — FILGRASTIM-SNDZ 480 MCG/0.8ML IJ SOSY
480.0000 ug | PREFILLED_SYRINGE | Freq: Once | INTRAMUSCULAR | Status: AC
  Administered 2023-02-20: 480 ug via SUBCUTANEOUS
  Filled 2023-02-20: qty 0.8

## 2023-02-20 NOTE — Progress Notes (Signed)
[facility] CONSULT NOTE  Patient Care Team: Gracelyn Nurse, MD as PCP - General (Internal Medicine) Kandyce Rud., MD (Rheumatology) Dasher, Cliffton Asters, MD (Dermatology) Earna Coder, MD as Consulting Physician (Hematology and Oncology) Bud Face, MD as Consulting Physician (Otolaryngology)  CHIEF COMPLAINTS/PURPOSE OF CONSULTATION: Autoimmune neutropenia  #April 2019- Autoimmune neutropenia-weekly Granix/CBC [Dr.Crocoran]; BMBx- no malignancy [Bone marrow aspirate and biopsy on 09/04/2017 revealed a hypercellular marrow for age with trilineage hematopoiesis.  There was abundant mature neutrophils.  Significant dyspoiesis or increased blasts was not identified.  Flow cytometry revealed a predominance of T lymphocytes (16% of all cells) with no abnormal phenotype.  There was a minor B-cell population (13% of lymphocytes) with slight kappa light chain excess.  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    HISTORY OF PRESENTING ILLNESS: Alone.  Ambulating independently.  [name] 88 y.o.  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  Patient has no concerns today and is feeling well. Patient has not needed any hospitalizations. Patient continues to complain of easy bruising.    Review of Systems  Constitutional:  Negative for chills, diaphoresis, fever, malaise/fatigue and weight loss.  HENT:  Negative for nosebleeds and sore throat.   Eyes:  Negative for double vision.  Respiratory:  Negative for cough,  hemoptysis, sputum production, shortness of breath and wheezing.   Cardiovascular:  Negative for chest pain, palpitations and orthopnea.  Gastrointestinal:  Negative for blood in stool, constipation, diarrhea, heartburn, melena, nausea and vomiting.  Genitourinary:  Negative for dysuria, frequency and urgency.  Musculoskeletal:  Negative for back pain and joint pain.  Skin:  Negative for itching.  Neurological:  Positive for tingling. Negative for dizziness, focal weakness, weakness and headaches.  Endo/Heme/Allergies:  Bruises/bleeds easily.  Psychiatric/Behavioral:  Negative for depression. The patient is nervous/anxious. The patient does not have insomnia.      MEDICAL HISTORY:  Past Medical History:  Diagnosis Date   Breast cancer (HCC) 1985   bilateral mastectomies   Depression    1 time specific situation that she did not know how to deal wiht   Diverticulosis    Hiatal hernia    History of Bell's palsy    History of colon polyps    History of COVID-19    History of nephrolithiasis    History of pancreatitis    Hyperlipidemia    IBS (irritable bowel syndrome)    Osteoporosis    osteopenia in neck   Skin cancer    Temporal arteritis (HCC)     SURGICAL HISTORY: Past Surgical History:  Procedure Laterality Date   AUGMENTATION MAMMAPLASTY Bilateral 2000   COLON RESECTION SIGMOID N/A 10/13/2017   Procedure: COLON RESECTION SIGMOID;  Surgeon: Carolan Shiver, MD;  Location: [facility] ORS;  Service: General;  Laterality: N/A;   COLOSTOMY N/A 10/13/2017   Procedure: COLOSTOMY;  Surgeon: Carolan Shiver, MD;  Location: [facility] ORS;  Service: General;  Laterality: N/A;   LAPAROSCOPIC CHOLECYSTECTOMY  2009   MASTECTOMY  1983   bilateral    SOCIAL HISTORY: Social History   Socioeconomic History   Marital  status: Widowed    Spouse name: Not on file   Number of children: 2   Years of education: college   Highest education level: Not on file  Occupational History    Occupation: Public relations account executive   Occupation: Retired  Tobacco Use   Smoking status: Former    Current packs/day: 0.00    Average packs/day: 0.3 packs/day for 30.0 years (7.5 ttl pk-yrs)    Types: Cigarettes    Start date: 05/18/1975    Quit date: 05/17/2005    Years since quitting: 17.7   Smokeless tobacco: Never   Tobacco comments:    quit 11 years ago  Vaping Use   Vaping status: Never Used  Substance and Sexual Activity   Alcohol use: No    Alcohol/week: 0.0 standard drinks of alcohol   Drug use: No   Sexual activity: Not Currently  Other Topics Concern   Not on file  Social History Narrative   Patient lives at home alone.    Patient is retired.    Patient has some college.    Patient has 2 children.   Social Determinants of Health   Financial Resource Strain: Not on file  Food Insecurity: No Food Insecurity (10/09/2022)   Hunger Vital Sign    Worried About Running Out of Food in the Last Year: Never true    Ran Out of Food in the Last Year: Never true  Transportation Needs: No Transportation Needs (10/09/2022)   PRAPARE - Administrator, Civil Service (Medical): No    Lack of Transportation (Non-Medical): No  Physical Activity: Not on file  Stress: Not on file  Social Connections: Not on file  Intimate Partner Violence: Not At Risk (10/09/2022)   Humiliation, Afraid, Rape, and Kick questionnaire    Fear of Current or Ex-Partner: No    Emotionally Abused: No    Physically Abused: No    Sexually Abused: No    FAMILY HISTORY: Family History  Problem Relation Age of Onset   Colon cancer Mother 98   Scleroderma Father 6   Alcoholism Brother    Stomach cancer Neg Hx     ALLERGIES:  is allergic to morphine, benzalkonium chloride, escitalopram, hydralazine, neomycin-bacitracin zn-polymyx, and bacitracin-neomycin-polymyxin.  MEDICATIONS:  Current Outpatient Medications  Medication Sig Dispense Refill   ALPRAZolam (XANAX) 0.25 MG tablet Take 0.25 mg by  mouth 2 (two) times daily as needed.      amLODipine (NORVASC) 5 MG tablet Take 0.5 tablets (2.5 mg total) by mouth daily. Pt states that she she increased the tablet to 5 mg daily. She was taking 2.5 mg. She stated that she thought the half tablet was not working efficiently. I asked the patient to inform her pcp that her dosage was increased.     Ascorbic Acid (VITAMIN C) 1000 MG tablet Take 1,000 mg by mouth daily.     aspirin EC 81 MG tablet Take 81 mg daily by mouth.     b complex vitamins capsule Take 1 capsule by mouth daily.     Biotin 1000 MCG tablet Take 1,000 mcg by mouth daily.      Cholecalciferol (VITAMIN D3) 2000 units capsule Take 2,000 Units by mouth daily.     Cyanocobalamin (B-12 PO) Take 2,500 mcg by mouth daily.     predniSONE (DELTASONE) 5 MG tablet Take 5 mg by mouth daily.     traMADol (ULTRAM) 50 MG tablet Take 50 mg by mouth daily.     ZINC OXIDE  PO Take 1 tablet by mouth daily.     No current facility-administered medications for this visit.   Facility-Administered Medications Ordered in Other Visits  Medication Dose Route Frequency Provider Last Rate Last Admin   filgrastim-sndz (ZARXIO) injection 480 mcg  480 mcg Subcutaneous Once Earna Coder, MD        PHYSICAL EXAMINATION: ECOG PERFORMANCE STATUS: 0 - Asymptomatic  Vitals:   02/20/23 1026  BP: (!) 168/68  Pulse: 79  Resp: 20  Temp: 98.6 F (37 C)  SpO2: 100%   Filed Weights   02/20/23 1026  Weight: 161 lb (73 kg)    Physical Exam Constitutional:      Comments: Alone.  HENT:     Head: Normocephalic and atraumatic.     Mouth/Throat:     Pharynx: No oropharyngeal exudate.  Eyes:     Pupils: Pupils are equal, round, and reactive to light.  Cardiovascular:     Rate and Rhythm: Normal rate and regular rhythm.  Pulmonary:     Effort: No respiratory distress.     Breath sounds: No wheezing.  Abdominal:     General: Bowel sounds are normal. There is no distension.     Palpations:  Abdomen is soft. There is no mass.     Tenderness: There is no abdominal tenderness. There is no guarding or rebound.     Comments: Colostomy.  Parastomal hernia.  Musculoskeletal:        General: No tenderness. Normal range of motion.     Cervical back: Normal range of motion and neck supple.  Skin:    General: Skin is warm.     Comments: Hyperpigmented rash noted bilateral shins.  Right arm bruising/thin skin.  Neurological:     Mental Status: She is alert and oriented to person, place, and time.  Psychiatric:        Mood and Affect: Affect normal.     Comments: Anxious.      LABORATORY DATA:  I have reviewed the data as listed Lab Results  Component Value Date   WBC 2.6 (L) 02/20/2023   HGB 14.2 02/20/2023   HCT 45.8 02/20/2023   MCV 93.1 02/20/2023   PLT 218 02/20/2023   Recent Labs    10/09/22 0606 10/10/22 0642 10/11/22 0442 10/31/22 1426 11/28/22 1009  NA 141 138 141 142 139  K 3.8 3.8 3.8 4.6 4.0  CL 105 107 110 102 102  CO2 25 24 24 26 29   GLUCOSE 103* 79 83 165* 91  BUN 27* 13 9 18 14   CREATININE 1.06* 0.84 0.82 1.03* 1.03*  CALCIUM 8.5* 7.9* 8.3* 9.3 8.9  GFRNONAA 51* >60 >60  --  53*  PROT 6.6 5.6*  --  6.5  --   ALBUMIN 4.0 3.2*  --  4.4  --   AST 22 22  --  23  --   ALT 15 11  --  15  --   ALKPHOS 95 81  --  128*  --   BILITOT 1.0 2.2*  --  0.9  --     RADIOGRAPHIC STUDIES: I have personally reviewed the radiological images as listed and agreed with the findings in the report. No results found.   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  # Patient's WBC- today-2.6 ; ANC- 0.9 proceed with zarxio today; and  continue weekly zarxio.  Patient clinically asymptomatic.  Continue cbc/zarxioweekly/pt pref.  stable; continue prednisone 5 mg/day [re- fill]; continue at current dose.   # Recent COVID s/p Anti-viral resolved.   # Elevated BP [at home ~130]- stable.   # Hx of  temporal arteritis->20 years on low dose prednisone-stable.    # Hx of B12 deficiency- July 2023- > 3400- recommend B12 PO three times week- stable.  # DISPOSITION: # proceed with ZARXIO today # weekly cbc/zaxio x 16 # follow up with MD in 16 Weeks; - cbc/bmp/ b12 levels-zarxio-Dr.B   All questions were answered. The patient knows to call the clinic with any problems, questions or concerns.    Earna Coder, MD 02/20/2023 11:10 AM

## 2023-02-20 NOTE — Assessment & Plan Note (Addendum)
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  # Patient's WBC- today-2.6 ; ANC- 0.9 proceed with zarxio today; and  continue weekly zarxio.  Patient clinically asymptomatic.  Continue cbc/zarxioweekly/pt pref. stable; continue prednisone 5 mg/day [re- fill]; continue at current dose.   # Recent COVID s/p Anti-viral resolved.   # Elevated BP [at home ~130]- stable.   # Hx of temporal arteritis->20 years on low dose prednisone-stable.    # Hx of B12 deficiency- July 2023- > 3400- recommend B12 PO three times week- stable.  # DISPOSITION: # proceed with ZARXIO today # weekly cbc/zaxio x 16 # follow up with MD in 16 Weeks; - cbc/bmp/ b12 levels-zarxio-Dr.B

## 2023-02-20 NOTE — Progress Notes (Signed)
Patient has no concerns 

## 2023-02-21 ENCOUNTER — Ambulatory Visit: Payer: Medicare Other | Admitting: Internal Medicine

## 2023-02-21 ENCOUNTER — Other Ambulatory Visit: Payer: Medicare Other

## 2023-02-21 ENCOUNTER — Ambulatory Visit: Payer: Medicare Other

## 2023-02-27 ENCOUNTER — Inpatient Hospital Stay: Payer: Medicare Other | Attending: Internal Medicine

## 2023-02-27 ENCOUNTER — Inpatient Hospital Stay: Payer: Medicare Other

## 2023-02-27 DIAGNOSIS — D708 Other neutropenia: Secondary | ICD-10-CM | POA: Insufficient documentation

## 2023-02-27 DIAGNOSIS — Z79899 Other long term (current) drug therapy: Secondary | ICD-10-CM | POA: Insufficient documentation

## 2023-02-27 DIAGNOSIS — D72819 Decreased white blood cell count, unspecified: Secondary | ICD-10-CM

## 2023-02-27 LAB — CBC WITH DIFFERENTIAL (CANCER CENTER ONLY)
Abs Immature Granulocytes: 0.03 10*3/uL (ref 0.00–0.07)
Basophils Absolute: 0 10*3/uL (ref 0.0–0.1)
Basophils Relative: 1 %
Eosinophils Absolute: 0.1 10*3/uL (ref 0.0–0.5)
Eosinophils Relative: 2 %
HCT: 46.8 % — ABNORMAL HIGH (ref 36.0–46.0)
Hemoglobin: 14.3 g/dL (ref 12.0–15.0)
Immature Granulocytes: 1 %
Lymphocytes Relative: 48 %
Lymphs Abs: 1.2 10*3/uL (ref 0.7–4.0)
MCH: 28.4 pg (ref 26.0–34.0)
MCHC: 30.6 g/dL (ref 30.0–36.0)
MCV: 92.9 fL (ref 80.0–100.0)
Monocytes Absolute: 0.2 10*3/uL (ref 0.1–1.0)
Monocytes Relative: 10 %
Neutro Abs: 0.9 10*3/uL — ABNORMAL LOW (ref 1.7–7.7)
Neutrophils Relative %: 38 %
Platelet Count: 242 10*3/uL (ref 150–400)
RBC: 5.04 MIL/uL (ref 3.87–5.11)
RDW: 15.7 % — ABNORMAL HIGH (ref 11.5–15.5)
WBC Count: 2.5 10*3/uL — ABNORMAL LOW (ref 4.0–10.5)
nRBC: 0 % (ref 0.0–0.2)

## 2023-02-27 MED ORDER — FILGRASTIM-SNDZ 480 MCG/0.8ML IJ SOSY
480.0000 ug | PREFILLED_SYRINGE | Freq: Once | INTRAMUSCULAR | Status: AC
  Administered 2023-02-27: 480 ug via SUBCUTANEOUS
  Filled 2023-02-27: qty 0.8

## 2023-03-06 ENCOUNTER — Inpatient Hospital Stay: Payer: Medicare Other

## 2023-03-06 DIAGNOSIS — D72819 Decreased white blood cell count, unspecified: Secondary | ICD-10-CM

## 2023-03-06 DIAGNOSIS — D708 Other neutropenia: Secondary | ICD-10-CM

## 2023-03-06 LAB — CBC WITH DIFFERENTIAL (CANCER CENTER ONLY)
Abs Immature Granulocytes: 0.03 10*3/uL (ref 0.00–0.07)
Basophils Absolute: 0 10*3/uL (ref 0.0–0.1)
Basophils Relative: 1 %
Eosinophils Absolute: 0.1 10*3/uL (ref 0.0–0.5)
Eosinophils Relative: 2 %
HCT: 45.3 % (ref 36.0–46.0)
Hemoglobin: 14.2 g/dL (ref 12.0–15.0)
Immature Granulocytes: 1 %
Lymphocytes Relative: 46 %
Lymphs Abs: 1.3 10*3/uL (ref 0.7–4.0)
MCH: 29.2 pg (ref 26.0–34.0)
MCHC: 31.3 g/dL (ref 30.0–36.0)
MCV: 93.2 fL (ref 80.0–100.0)
Monocytes Absolute: 0.3 10*3/uL (ref 0.1–1.0)
Monocytes Relative: 12 %
Neutro Abs: 1.1 10*3/uL — ABNORMAL LOW (ref 1.7–7.7)
Neutrophils Relative %: 38 %
Platelet Count: 221 10*3/uL (ref 150–400)
RBC: 4.86 MIL/uL (ref 3.87–5.11)
RDW: 15.5 % (ref 11.5–15.5)
Smear Review: NORMAL
WBC Count: 2.9 10*3/uL — ABNORMAL LOW (ref 4.0–10.5)
nRBC: 0 % (ref 0.0–0.2)

## 2023-03-06 MED ORDER — FILGRASTIM-SNDZ 480 MCG/0.8ML IJ SOSY
480.0000 ug | PREFILLED_SYRINGE | Freq: Once | INTRAMUSCULAR | Status: AC
  Administered 2023-03-06: 480 ug via SUBCUTANEOUS
  Filled 2023-03-06: qty 0.8

## 2023-03-12 ENCOUNTER — Other Ambulatory Visit: Payer: Self-pay | Admitting: *Deleted

## 2023-03-12 DIAGNOSIS — D72819 Decreased white blood cell count, unspecified: Secondary | ICD-10-CM

## 2023-03-13 ENCOUNTER — Inpatient Hospital Stay: Payer: Medicare Other

## 2023-03-13 DIAGNOSIS — D708 Other neutropenia: Secondary | ICD-10-CM

## 2023-03-13 DIAGNOSIS — D72819 Decreased white blood cell count, unspecified: Secondary | ICD-10-CM

## 2023-03-13 LAB — CBC WITH DIFFERENTIAL (CANCER CENTER ONLY)
Abs Immature Granulocytes: 0.16 10*3/uL — ABNORMAL HIGH (ref 0.00–0.07)
Basophils Absolute: 0 10*3/uL (ref 0.0–0.1)
Basophils Relative: 1 %
Eosinophils Absolute: 0 10*3/uL (ref 0.0–0.5)
Eosinophils Relative: 1 %
HCT: 45.8 % (ref 36.0–46.0)
Hemoglobin: 14.3 g/dL (ref 12.0–15.0)
Immature Granulocytes: 6 %
Lymphocytes Relative: 29 %
Lymphs Abs: 0.7 10*3/uL (ref 0.7–4.0)
MCH: 29.1 pg (ref 26.0–34.0)
MCHC: 31.2 g/dL (ref 30.0–36.0)
MCV: 93.3 fL (ref 80.0–100.0)
Monocytes Absolute: 0.2 10*3/uL (ref 0.1–1.0)
Monocytes Relative: 7 %
Neutro Abs: 1.4 10*3/uL — ABNORMAL LOW (ref 1.7–7.7)
Neutrophils Relative %: 56 %
Platelet Count: 217 10*3/uL (ref 150–400)
RBC: 4.91 MIL/uL (ref 3.87–5.11)
RDW: 15.7 % — ABNORMAL HIGH (ref 11.5–15.5)
Smear Review: NORMAL
WBC Count: 2.6 10*3/uL — ABNORMAL LOW (ref 4.0–10.5)
nRBC: 0 % (ref 0.0–0.2)

## 2023-03-13 MED ORDER — FILGRASTIM-SNDZ 480 MCG/0.8ML IJ SOSY
480.0000 ug | PREFILLED_SYRINGE | Freq: Once | INTRAMUSCULAR | Status: AC
  Administered 2023-03-13: 480 ug via SUBCUTANEOUS
  Filled 2023-03-13: qty 0.8

## 2023-03-20 ENCOUNTER — Inpatient Hospital Stay: Payer: Medicare Other

## 2023-03-20 ENCOUNTER — Encounter: Payer: Self-pay | Admitting: Physician Assistant

## 2023-03-20 ENCOUNTER — Ambulatory Visit: Payer: Medicare Other | Admitting: Physician Assistant

## 2023-03-20 VITALS — BP 140/74 | HR 99 | Temp 98.0°F | Ht 66.0 in | Wt 160.0 lb

## 2023-03-20 DIAGNOSIS — K5904 Chronic idiopathic constipation: Secondary | ICD-10-CM

## 2023-03-20 DIAGNOSIS — K921 Melena: Secondary | ICD-10-CM

## 2023-03-20 DIAGNOSIS — D72819 Decreased white blood cell count, unspecified: Secondary | ICD-10-CM

## 2023-03-20 DIAGNOSIS — D708 Other neutropenia: Secondary | ICD-10-CM | POA: Diagnosis not present

## 2023-03-20 LAB — CBC WITH DIFFERENTIAL (CANCER CENTER ONLY)
Abs Immature Granulocytes: 0.03 10*3/uL (ref 0.00–0.07)
Basophils Absolute: 0 10*3/uL (ref 0.0–0.1)
Basophils Relative: 1 %
Eosinophils Absolute: 0.1 10*3/uL (ref 0.0–0.5)
Eosinophils Relative: 3 %
HCT: 46.8 % — ABNORMAL HIGH (ref 36.0–46.0)
Hemoglobin: 14.6 g/dL (ref 12.0–15.0)
Immature Granulocytes: 1 %
Lymphocytes Relative: 54 %
Lymphs Abs: 1.5 10*3/uL (ref 0.7–4.0)
MCH: 28.9 pg (ref 26.0–34.0)
MCHC: 31.2 g/dL (ref 30.0–36.0)
MCV: 92.5 fL (ref 80.0–100.0)
Monocytes Absolute: 0.3 10*3/uL (ref 0.1–1.0)
Monocytes Relative: 12 %
Neutro Abs: 0.8 10*3/uL — ABNORMAL LOW (ref 1.7–7.7)
Neutrophils Relative %: 29 %
Platelet Count: 234 10*3/uL (ref 150–400)
RBC: 5.06 MIL/uL (ref 3.87–5.11)
RDW: 15.9 % — ABNORMAL HIGH (ref 11.5–15.5)
Smear Review: NORMAL
WBC Count: 2.8 10*3/uL — ABNORMAL LOW (ref 4.0–10.5)
nRBC: 0 % (ref 0.0–0.2)

## 2023-03-20 MED ORDER — FILGRASTIM-SNDZ 480 MCG/0.8ML IJ SOSY
480.0000 ug | PREFILLED_SYRINGE | Freq: Once | INTRAMUSCULAR | Status: AC
  Administered 2023-03-20: 480 ug via SUBCUTANEOUS
  Filled 2023-03-20: qty 0.8

## 2023-03-20 MED ORDER — LINACLOTIDE 145 MCG PO CAPS
145.0000 ug | ORAL_CAPSULE | Freq: Every day | ORAL | 5 refills | Status: DC
Start: 2023-03-20 — End: 2024-01-22

## 2023-03-20 NOTE — Progress Notes (Signed)
[name], PA-C [address]  [address]  [place], [place] [address]  Main: [phone]  Fax: [phone]   Primary Care Physician: Gracelyn Nurse, MD  Primary Gastroenterologist:  [name], PA-C / Dr. Wyline Mood    CC: Black Stool, chronic constipation  HPI: [name] is a 88 y.o. female is self-referred to evaluate 1 black stool.  She has chronic constipation for many years.  This week she had no bowel movement for 4 days.  Last night she used a rectal suppository.  At 8 PM last night she had 1 large hard black stool.  No more bowel movement since then.  She denies Pepto-Bismol use.  She has not had any melena, bright red rectal bleeding, black tarry stools, abdominal pain, heartburn, or other GI symptoms.  She denies NSAID use.  She takes iron tablet daily.  She has taken MiraLAX and Linzess 145 in the past for constipation.  No current constipation treatment.  She needs refill of Linzess which worked well.  She has history of leukopenia.  She has blood work done through hematology weekly.  CBC lab drawn this morning showed normal hemoglobin 14.6g, WBC 2.8.  No anemia.  Stable white count.   Current Outpatient Medications  Medication Sig Dispense Refill   ALPRAZolam (XANAX) 0.25 MG tablet Take 0.25 mg by mouth 2 (two) times daily as needed.      amLODipine (NORVASC) 5 MG tablet Take 0.5 tablets (2.5 mg total) by mouth daily. Pt states that she she increased the tablet to 5 mg daily. She was taking 2.5 mg. She stated that she thought the half tablet was not working efficiently. I asked the patient to inform her pcp that her dosage was increased.     Ascorbic Acid (VITAMIN C) 1000 MG tablet Take 1,000 mg by mouth daily.     aspirin EC 81 MG tablet Take 81 mg daily by mouth.     b complex vitamins capsule Take 1 capsule by mouth daily.     Biotin 1000 MCG tablet Take 1,000 mcg by mouth daily.      Cholecalciferol (VITAMIN D3) 2000 units capsule Take 2,000  Units by mouth daily.     Cyanocobalamin (B-12 PO) Take 2,500 mcg by mouth daily.     linaclotide (LINZESS) 145 MCG CAPS capsule Take 1 capsule (145 mcg total) by mouth daily before breakfast. 30 capsule 5   predniSONE (DELTASONE) 5 MG tablet Take 5 mg by mouth daily.     traMADol (ULTRAM) 50 MG tablet Take 50 mg by mouth daily.     ZINC OXIDE PO Take 1 tablet by mouth daily.     No current facility-administered medications for this visit.    Allergies as of 03/20/2023 - Review Complete 03/20/2023  Allergen Reaction Noted   Morphine Other (See Comments) 07/12/2018   Benzalkonium chloride Dermatitis 04/14/2014   Escitalopram  10/20/2014   Hydralazine  07/25/2018   Neomycin-bacitracin zn-polymyx Dermatitis 06/08/2008   Bacitracin-neomycin-polymyxin Dermatitis 06/08/2008    Past Medical History:  Diagnosis Date   Breast cancer (HCC) 1985   bilateral mastectomies   Depression    1 time specific situation that she did not know how to deal wiht   Diverticulosis    Hiatal hernia    History of Bell's palsy    History of colon polyps    History of COVID-19    History of nephrolithiasis    History of pancreatitis    Hyperlipidemia  IBS (irritable bowel syndrome)    Osteoporosis    osteopenia in neck   Skin cancer    Temporal arteritis (HCC)     Past Surgical History:  Procedure Laterality Date   AUGMENTATION MAMMAPLASTY Bilateral 2000   COLON RESECTION SIGMOID N/A 10/13/2017   Procedure: COLON RESECTION SIGMOID;  Surgeon: Carolan Shiver, MD;  Location: [facility] ORS;  Service: General;  Laterality: N/A;   COLOSTOMY N/A 10/13/2017   Procedure: COLOSTOMY;  Surgeon: Carolan Shiver, MD;  Location: [facility] ORS;  Service: General;  Laterality: N/A;   LAPAROSCOPIC CHOLECYSTECTOMY  2009   MASTECTOMY  1983   bilateral    Review of Systems:    All systems reviewed and negative except where noted in HPI.   Physical Examination:   BP (!) 140/74   Pulse 99   Temp 98 F  (36.7 C)   Ht 5\' 6"  (1.676 m)   Wt 160 lb (72.6 kg)   BMI 25.82 kg/m   General: Well-nourished, well-developed in no acute distress.  Lungs: Clear to auscultation bilaterally. Non-labored. Heart: Regular rate and rhythm, no murmurs rubs or gallops.  Abdomen: Bowel sounds are normal; Abdomen is Soft; No hepatosplenomegaly, masses or hernias; mild LLQ abdominal Tenderness; rest of abdomen is nontender; no guarding or rebound tenderness. Neuro: Alert and oriented x 3.  Grossly intact.  Psych: Alert and cooperative, normal mood and affect.   Imaging Studies: No results found.  Assessment and Plan:   [name] is a 88 y.o. y/o female presents for evaluation of black stool x 1.  She has history of chronic constipation.  Black Stool x 1 last night (formed). CBC drawn this morning showed normal hemoglobin 14.6 g.  Reassurance. I ordered FOBT fit test to check for blood in stool. Advised her to avoid Pepto-Bismol and iron. Okay to take general multivitamin daily. Let us know if she develops melena with loose black tarry stools.  2.  Chronic idiopathic constipation  Prescribed Linzess 145 mcg 1 capsule once daily, #30, 5 refills.  [name], PA-C  Follow up in 1 month or sooner if symptoms worsen.

## 2023-03-21 ENCOUNTER — Telehealth: Payer: Self-pay

## 2023-03-21 NOTE — Telephone Encounter (Signed)
error 

## 2023-03-27 ENCOUNTER — Inpatient Hospital Stay: Payer: Medicare Other

## 2023-03-27 DIAGNOSIS — D708 Other neutropenia: Secondary | ICD-10-CM | POA: Diagnosis not present

## 2023-03-27 DIAGNOSIS — D72819 Decreased white blood cell count, unspecified: Secondary | ICD-10-CM

## 2023-03-27 LAB — CBC WITH DIFFERENTIAL (CANCER CENTER ONLY)
Abs Immature Granulocytes: 0.05 10*3/uL (ref 0.00–0.07)
Basophils Absolute: 0 10*3/uL (ref 0.0–0.1)
Basophils Relative: 1 %
Eosinophils Absolute: 0 10*3/uL (ref 0.0–0.5)
Eosinophils Relative: 1 %
HCT: 46.6 % — ABNORMAL HIGH (ref 36.0–46.0)
Hemoglobin: 14.4 g/dL (ref 12.0–15.0)
Immature Granulocytes: 2 %
Lymphocytes Relative: 36 %
Lymphs Abs: 1 10*3/uL (ref 0.7–4.0)
MCH: 29 pg (ref 26.0–34.0)
MCHC: 30.9 g/dL (ref 30.0–36.0)
MCV: 93.8 fL (ref 80.0–100.0)
Monocytes Absolute: 0.3 10*3/uL (ref 0.1–1.0)
Monocytes Relative: 10 %
Neutro Abs: 1.3 10*3/uL — ABNORMAL LOW (ref 1.7–7.7)
Neutrophils Relative %: 50 %
Platelet Count: 221 10*3/uL (ref 150–400)
RBC: 4.97 MIL/uL (ref 3.87–5.11)
RDW: 15.6 % — ABNORMAL HIGH (ref 11.5–15.5)
Smear Review: NORMAL
WBC Count: 2.7 10*3/uL — ABNORMAL LOW (ref 4.0–10.5)
nRBC: 0 % (ref 0.0–0.2)

## 2023-03-27 MED ORDER — FILGRASTIM-SNDZ 480 MCG/0.8ML IJ SOSY
480.0000 ug | PREFILLED_SYRINGE | Freq: Once | INTRAMUSCULAR | Status: AC
  Administered 2023-03-27: 480 ug via SUBCUTANEOUS

## 2023-03-30 ENCOUNTER — Telehealth: Payer: Self-pay

## 2023-03-30 NOTE — Telephone Encounter (Signed)
She took Linzess 145 mcg last week and on Sunday 03/25/23 you begin having loose stool mixed with small amount of bright red blood this went on for 12 hours. Wednesday she felt weak  She ate saltine crackers. She is drinking fluids and eating saltine crackers. She denies feeling dehydrated. Bowel movements normal no blood. Try eating bland diet. She has stopped the Linzess and refuses to take anymore.

## 2023-04-03 ENCOUNTER — Inpatient Hospital Stay: Payer: Medicare Other | Attending: Internal Medicine

## 2023-04-03 ENCOUNTER — Inpatient Hospital Stay: Payer: Medicare Other

## 2023-04-03 DIAGNOSIS — D708 Other neutropenia: Secondary | ICD-10-CM | POA: Diagnosis present

## 2023-04-03 DIAGNOSIS — Z79899 Other long term (current) drug therapy: Secondary | ICD-10-CM | POA: Diagnosis not present

## 2023-04-03 DIAGNOSIS — D72819 Decreased white blood cell count, unspecified: Secondary | ICD-10-CM

## 2023-04-03 LAB — CBC WITH DIFFERENTIAL (CANCER CENTER ONLY)
Abs Immature Granulocytes: 0 10*3/uL (ref 0.00–0.07)
Basophils Absolute: 0.1 10*3/uL (ref 0.0–0.1)
Basophils Relative: 2 %
Eosinophils Absolute: 0.1 10*3/uL (ref 0.0–0.5)
Eosinophils Relative: 2 %
HCT: 46.4 % — ABNORMAL HIGH (ref 36.0–46.0)
Hemoglobin: 14.6 g/dL (ref 12.0–15.0)
Lymphocytes Relative: 37 %
Lymphs Abs: 1.1 10*3/uL (ref 0.7–4.0)
MCH: 29.3 pg (ref 26.0–34.0)
MCHC: 31.5 g/dL (ref 30.0–36.0)
MCV: 93 fL (ref 80.0–100.0)
Metamyelocytes Relative: 1 %
Monocytes Absolute: 0.2 10*3/uL (ref 0.1–1.0)
Monocytes Relative: 6 %
Neutro Abs: 1.5 10*3/uL — ABNORMAL LOW (ref 1.7–7.7)
Neutrophils Relative %: 52 %
Platelet Count: 249 10*3/uL (ref 150–400)
RBC: 4.99 MIL/uL (ref 3.87–5.11)
RDW: 15.4 % (ref 11.5–15.5)
Smear Review: ADEQUATE
WBC Count: 2.9 10*3/uL — ABNORMAL LOW (ref 4.0–10.5)
nRBC: 0 % (ref 0.0–0.2)

## 2023-04-10 ENCOUNTER — Inpatient Hospital Stay: Payer: Medicare Other

## 2023-04-10 DIAGNOSIS — D708 Other neutropenia: Secondary | ICD-10-CM

## 2023-04-10 DIAGNOSIS — D72819 Decreased white blood cell count, unspecified: Secondary | ICD-10-CM

## 2023-04-10 LAB — CBC WITH DIFFERENTIAL (CANCER CENTER ONLY)
Abs Immature Granulocytes: 0.02 10*3/uL (ref 0.00–0.07)
Basophils Absolute: 0 10*3/uL (ref 0.0–0.1)
Basophils Relative: 1 %
Eosinophils Absolute: 0 10*3/uL (ref 0.0–0.5)
Eosinophils Relative: 1 %
HCT: 46.2 % — ABNORMAL HIGH (ref 36.0–46.0)
Hemoglobin: 14.5 g/dL (ref 12.0–15.0)
Immature Granulocytes: 1 %
Lymphocytes Relative: 44 %
Lymphs Abs: 1 10*3/uL (ref 0.7–4.0)
MCH: 29.4 pg (ref 26.0–34.0)
MCHC: 31.4 g/dL (ref 30.0–36.0)
MCV: 93.7 fL (ref 80.0–100.0)
Monocytes Absolute: 0.2 10*3/uL (ref 0.1–1.0)
Monocytes Relative: 10 %
Neutro Abs: 1 10*3/uL — ABNORMAL LOW (ref 1.7–7.7)
Neutrophils Relative %: 43 %
Platelet Count: 233 10*3/uL (ref 150–400)
RBC: 4.93 MIL/uL (ref 3.87–5.11)
RDW: 15.3 % (ref 11.5–15.5)
WBC Count: 2.3 10*3/uL — ABNORMAL LOW (ref 4.0–10.5)
nRBC: 0 % (ref 0.0–0.2)

## 2023-04-10 MED ORDER — FILGRASTIM-SNDZ 480 MCG/0.8ML IJ SOSY
480.0000 ug | PREFILLED_SYRINGE | Freq: Once | INTRAMUSCULAR | Status: AC
  Administered 2023-04-10: 480 ug via SUBCUTANEOUS
  Filled 2023-04-10: qty 0.8

## 2023-04-11 ENCOUNTER — Encounter: Payer: Self-pay | Admitting: Emergency Medicine

## 2023-04-11 ENCOUNTER — Other Ambulatory Visit: Payer: Self-pay

## 2023-04-11 ENCOUNTER — Emergency Department: Payer: Medicare Other

## 2023-04-11 ENCOUNTER — Emergency Department
Admission: EM | Admit: 2023-04-11 | Discharge: 2023-04-11 | Disposition: A | Discharge status: Home / Self Care | Payer: Medicare Other | Attending: Emergency Medicine | Admitting: Emergency Medicine

## 2023-04-11 DIAGNOSIS — R1032 Left lower quadrant pain: Secondary | ICD-10-CM | POA: Diagnosis present

## 2023-04-11 DIAGNOSIS — D72829 Elevated white blood cell count, unspecified: Secondary | ICD-10-CM | POA: Diagnosis not present

## 2023-04-11 DIAGNOSIS — D72825 Bandemia: Secondary | ICD-10-CM | POA: Diagnosis not present

## 2023-04-11 DIAGNOSIS — K5900 Constipation, unspecified: Secondary | ICD-10-CM | POA: Diagnosis not present

## 2023-04-11 DIAGNOSIS — I10 Essential (primary) hypertension: Secondary | ICD-10-CM | POA: Insufficient documentation

## 2023-04-11 LAB — URINALYSIS, ROUTINE W REFLEX MICROSCOPIC
Bilirubin Urine: NEGATIVE
Glucose, UA: NEGATIVE mg/dL
Hgb urine dipstick: NEGATIVE
Ketones, ur: NEGATIVE mg/dL
Leukocytes,Ua: NEGATIVE
Nitrite: NEGATIVE
Protein, ur: NEGATIVE mg/dL
Specific Gravity, Urine: 1.02 (ref 1.005–1.030)
pH: 7 (ref 5.0–8.0)

## 2023-04-11 LAB — COMPREHENSIVE METABOLIC PANEL
ALT: 16 U/L (ref 0–44)
AST: 23 U/L (ref 15–41)
Albumin: 3.8 g/dL (ref 3.5–5.0)
Alkaline Phosphatase: 98 U/L (ref 38–126)
Anion gap: 10 (ref 5–15)
BUN: 18 mg/dL (ref 8–23)
CO2: 28 mmol/L (ref 22–32)
Calcium: 8.8 mg/dL — ABNORMAL LOW (ref 8.9–10.3)
Chloride: 102 mmol/L (ref 98–111)
Creatinine, Ser: 0.87 mg/dL (ref 0.44–1.00)
GFR, Estimated: >60 mL/min (ref >60)
Glucose, Bld: 91 mg/dL (ref 70–99)
Potassium: 3.9 mmol/L (ref 3.5–5.1)
Sodium: 140 mmol/L (ref 135–145)
Total Bilirubin: 1.3 mg/dL — ABNORMAL HIGH (ref <1.2)
Total Protein: 6.8 g/dL (ref 6.5–8.1)

## 2023-04-11 LAB — CBC WITH DIFFERENTIAL/PLATELET
Abs Immature Granulocytes: 1.48 10*3/uL — ABNORMAL HIGH (ref 0.00–0.07)
Basophils Absolute: 0.1 10*3/uL (ref 0.0–0.1)
Basophils Relative: 0 %
Eosinophils Absolute: 0 10*3/uL (ref 0.0–0.5)
Eosinophils Relative: 0 %
HCT: 48.6 % — ABNORMAL HIGH (ref 36.0–46.0)
Hemoglobin: 15.1 g/dL — ABNORMAL HIGH (ref 12.0–15.0)
Immature Granulocytes: 5 %
Lymphocytes Relative: 7 %
Lymphs Abs: 1.9 10*3/uL (ref 0.7–4.0)
MCH: 28.7 pg (ref 26.0–34.0)
MCHC: 31.1 g/dL (ref 30.0–36.0)
MCV: 92.4 fL (ref 80.0–100.0)
Monocytes Absolute: 0.8 10*3/uL (ref 0.1–1.0)
Monocytes Relative: 3 %
Neutro Abs: 24.1 10*3/uL — ABNORMAL HIGH (ref 1.7–7.7)
Neutrophils Relative %: 85 %
Platelets: 245 10*3/uL (ref 150–400)
RBC: 5.26 MIL/uL — ABNORMAL HIGH (ref 3.87–5.11)
RDW: 15.6 % — ABNORMAL HIGH (ref 11.5–15.5)
Smear Review: NORMAL
WBC Morphology: INCREASED
WBC: 28.3 10*3/uL — ABNORMAL HIGH (ref 4.0–10.5)
nRBC: 0 % (ref 0.0–0.2)

## 2023-04-11 LAB — LIPASE, BLOOD: Lipase: 38 U/L (ref 11–51)

## 2023-04-11 MED ORDER — ONDANSETRON HCL 4 MG/2ML IJ SOLN
4.0000 mg | Freq: Once | INTRAMUSCULAR | Status: AC
Start: 2023-04-11 — End: 2023-04-11
  Administered 2023-04-11: 4 mg via INTRAVENOUS
  Filled 2023-04-11: qty 2

## 2023-04-11 MED ORDER — VANCOMYCIN HCL IN DEXTROSE 1-5 GM/200ML-% IV SOLN: 1000.0000 mg | Freq: Once | INTRAVENOUS | Status: DC

## 2023-04-11 MED ORDER — MORPHINE SULFATE (PF) 4 MG/ML IV SOLN
4.0000 mg | Freq: Once | INTRAVENOUS | Status: AC
  Administered 2023-04-11: 4 mg via INTRAVENOUS
  Filled 2023-04-11: qty 1

## 2023-04-11 MED ORDER — SODIUM CHLORIDE 0.9 % IV SOLN: 2.0000 g | Freq: Once | INTRAVENOUS | Status: DC

## 2023-04-11 MED ORDER — IOHEXOL 300 MG/ML  SOLN
100.0000 mL | Freq: Once | INTRAMUSCULAR | Status: AC | PRN
  Administered 2023-04-11: 100 mL via INTRAVENOUS

## 2023-04-11 MED ORDER — LACTATED RINGERS IV BOLUS
1000.0000 mL | Freq: Once | INTRAVENOUS | Status: AC
  Administered 2023-04-11: 1000 mL via INTRAVENOUS

## 2023-04-11 MED ORDER — METRONIDAZOLE 500 MG/100ML IV SOLN: 500.0000 mg | Freq: Once | INTRAVENOUS | Status: DC

## 2023-04-11 NOTE — Discharge Instructions (Addendum)
You were seen in the emergency department today for constipation.  We recommend that you use one or more of the following over-the-counter medications in the order described:   1)  Colace (or Dulcolax) 100 mg:  This is a stool softener, and you may take it once or twice a day as needed. 2)  Senna tablets:  This is a bowel stimulant that will help "push" out your stool. It is the next step to add after you have tried a stool softener. 3)  Miralax (powder):  This medication works by drawing additional fluid into your intestines and helps to flush out your stool.  Mix the powder with water or juice according to label instructions.  It may help if the Colace and Senna are not sufficient, but you must be sure to use the recommended amount of water or juice when you mix up the powder. 4)  Look for magnesium citrate at the pharmacy (it is usually a small glass bottle).  Drink the bottle according to the label instructions.  Remember that narcotic pain medications are constipating, so avoid them or minimize their use.  Drink plenty of fluids.  Please return to the Emergency Department immediately if you develop new or worsening symptoms that concern you, such as (but not limited to) fever > 101 degrees, severe abdominal pain, or persistent vomiting.

## 2023-04-11 NOTE — ED Triage Notes (Signed)
Pt via POV from home. Pt LLQ abd pain that started last night. Denies NV. Denies urinary symptoms. States that was placed on Linzess a week ago and that is what started all symptom. Pt has a hx of diverticulosis, IBS, and pancreatitis. States she was constipated and used a suppository and she had a BM but still having pain. Pt is A&Ox4 and NAD

## 2023-04-11 NOTE — ED Notes (Signed)
Pt to CT

## 2023-04-11 NOTE — ED Notes (Signed)
Pt ambulated to in room toilet with minimal assistance from staff. Pt tolerated activity well.

## 2023-04-11 NOTE — ED Provider Notes (Signed)
[facility] Provider Note    Event Date/Time   First MD Initiated Contact with Patient 04/11/23 [phone]     (approximate)   History   Chief Complaint Abdominal Pain   HPI  [name] is a 88 y.o. female with past medical history of hypertension, GERD, IBS, pancreatitis, diverticulitis who presents to the ED complaining of abdominal pain.  Patient reports that she has had increasing pain in the left lower quadrant of her abdomen since last night.  She describes the pain as sharp and not exacerbated or alleviated by anything.  She denies any fevers, dysuria, or flank pain.  She does state that she was constipated and took a suppository earlier this morning, which resulted in a bowel movement but no improvement in her pain.  She has not had any nausea or vomiting.     Physical Exam   Triage Vital Signs: ED Triage Vitals [04/11/23 0753]  Encounter Vitals Group     BP      Systolic BP Percentile      Diastolic BP Percentile      Pulse      Resp      Temp      Temp src      SpO2      Weight      Height      Head Circumference      Peak Flow      Pain Score 0     Pain Loc      Pain Education      Exclude from Growth Chart     Most recent vital signs: Vitals:   04/11/23 1330 04/11/23 1400  BP: (!) 131/45 (!) 142/51  Pulse: 70 80  Resp:    Temp:    SpO2: 97% 98%    Constitutional: Alert and oriented. Eyes: Conjunctivae are normal. Head: Atraumatic. Nose: No congestion/rhinnorhea. Mouth/Throat: Mucous membranes are moist.  Cardiovascular: Normal rate, regular rhythm. Grossly normal heart sounds.  2+ radial pulses bilaterally. Respiratory: Normal respiratory effort.  No retractions. Lungs CTAB. Gastrointestinal: Soft and tender to palpation in the left lower quadrant with no rebound or guarding. No distention. Musculoskeletal: No lower extremity tenderness nor edema.  Neurologic:  Normal speech and language. No gross focal neurologic  deficits are appreciated.    ED Results / Procedures / Treatments   Labs (all labs ordered are listed, but only abnormal results are displayed) Labs Reviewed  CBC WITH DIFFERENTIAL/PLATELET - Abnormal; Notable for the following components:      Result Value   WBC 28.3 (*)    RBC 5.26 (*)    Hemoglobin 15.1 (*)    HCT 48.6 (*)    RDW 15.6 (*)    Neutro Abs 24.1 (*)    Abs Immature Granulocytes 1.48 (*)    All other components within normal limits  URINALYSIS, ROUTINE W REFLEX MICROSCOPIC - Abnormal; Notable for the following components:   Color, Urine YELLOW (*)    APPearance CLEAR (*)    All other components within normal limits  COMPREHENSIVE METABOLIC PANEL - Abnormal; Notable for the following components:   Calcium 8.8 (*)    Total Bilirubin 1.3 (*)    All other components within normal limits  LIPASE, BLOOD   RADIOLOGY CT abdomen/pelvis reviewed and interpreted by me with no inflammatory changes, focal fluid collections, or dilated bowel loops.  PROCEDURES:  Critical Care performed: No  Procedures   MEDICATIONS ORDERED IN ED: Medications  morphine (  PF) 4 MG/ML injection 4 mg (4 mg Intravenous Given 04/11/23 0848)  ondansetron (ZOFRAN) injection 4 mg (4 mg Intravenous Given 04/11/23 0848)  iohexol (OMNIPAQUE) 300 MG/ML solution 100 mL (100 mLs Intravenous Contrast Given 04/11/23 0925)  lactated ringers bolus 1,000 mL (1,000 mLs Intravenous New Bag/Given 04/11/23 1133)     IMPRESSION / MDM / ASSESSMENT AND PLAN / ED COURSE  I reviewed the triage vital signs and the nursing notes.                              88 y.o. female with past medical history of hypertension, GERD, IBS, diverticulitis, and pancreatitis who presents to the ED complaining of increasing pain in the left lower quadrant of her abdomen since last night.  Patient's presentation is most consistent with acute presentation with potential threat to life or bodily function.  Differential diagnosis  includes, but is not limited to, diverticulitis, abscess, bowel perforation, kidney stone, colitis, UTI.  Patient nontoxic-appearing and in no acute distress, vital signs are unremarkable.  Her abdomen is soft but she does have significant tenderness in the left lower quadrant, we will further assess with labs, imaging, and urinalysis.  Plan to treat symptomatically with IV morphine and Zofran, reassess following labs and imaging.  CT imaging of abdomen/pelvis is negative for acute process.  Labs do show significant leukocytosis with bandemia but no anemia, electrolyte abnormality, or AKI.  LFTs and lipase are unremarkable.  Patient did receive filgrastim infusion yesterday and I discussed case with Dr. Donneta Romberg, who states that leukocytosis and bandemia is not unexpected following filgrastim.  Urinalysis with no signs of infection and chest x-ray unremarkable by my read.  Patient eager to be discharged home at this time prior to official radiology read of her chest x-ray.  Given low suspicion for pneumonia, this is reasonable and she was counseled to follow-up with her PCP.  She was counseled to return to the ED for new or worsening symptoms, patient and family agree with plan.      FINAL CLINICAL IMPRESSION(S) / ED DIAGNOSES   Final diagnoses:  LLQ abdominal pain  Leukocytosis, unspecified type  Constipation, unspecified constipation type     Rx / DC Orders   ED Discharge Orders     None        Note:  This document was prepared using Dragon voice recognition software and may include unintentional dictation errors.   Chesley Noon, MD 04/11/23 [phone]

## 2023-04-12 ENCOUNTER — Ambulatory Visit (INDEPENDENT_AMBULATORY_CARE_PROVIDER_SITE_OTHER): Payer: Medicare Other | Admitting: Vascular Surgery

## 2023-04-12 ENCOUNTER — Encounter (INDEPENDENT_AMBULATORY_CARE_PROVIDER_SITE_OTHER): Payer: Medicare Other

## 2023-04-12 NOTE — Progress Notes (Deleted)
Tonya Amy, PA-C [address]  [address]  [place], [place] [address]  Main: [phone]  Fax: [phone]   Primary Care Physician: Tonya Nurse, MD  Primary Gastroenterologist:  ***  CC: F/U LLQ pain, chronic constipation, black stool  HPI: [name] is a 88 y.o. female returns for follow-up presents for follow-up of LLQ pain, chronic constipation, and black stool.  She has been taking Linzess 145 mcg 1 capsule daily for chronic constipation.  History of chronic leukopenia followed by oncology and gets labs weekly.  Labs between September and 04/11/2023 have shown normal hemoglobin ranging between 14.2 and 15.1.  No anemia.  She went to [facility] ED 04/11/2023 to evaluate LLQ pain.  Abdominal pelvic CT showed small sliding hiatal hernia, mild hepatic steatosis, small jejunal diverticulum, no acute abnormality to explain LLQ pain.  No evidence of diverticulitis.  Moderate peripheral atherosclerosis of the aorta.  Last colonoscopy 05/2012 by Dr. [name] at [facility] showed 2 small 3 mm to 5 mm hyperplastic polyps removed.  Moderate sigmoid diverticulosis.  Good prep.  No repeat colonoscopy due to advanced age.  Current Outpatient Medications  Medication Sig Dispense Refill   ALPRAZolam (XANAX) 0.25 MG tablet Take 0.25 mg by mouth 2 (two) times daily as needed.      amLODipine (NORVASC) 5 MG tablet Take 0.5 tablets (2.5 mg total) by mouth daily. Pt states that she she increased the tablet to 5 mg daily. She was taking 2.5 mg. She stated that she thought the half tablet was not working efficiently. I asked the patient to inform her pcp that her dosage was increased.     Ascorbic Acid (VITAMIN C) 1000 MG tablet Take 1,000 mg by mouth daily.     aspirin EC 81 MG tablet Take 81 mg daily by mouth.     b complex vitamins capsule Take 1 capsule by mouth daily.     Biotin 1000 MCG tablet Take 1,000 mcg by mouth daily.      Cholecalciferol (VITAMIN D3) 2000 units  capsule Take 2,000 Units by mouth daily.     Cyanocobalamin (B-12 PO) Take 2,500 mcg by mouth daily.     linaclotide (LINZESS) 145 MCG CAPS capsule Take 1 capsule (145 mcg total) by mouth daily before breakfast. 30 capsule 5   predniSONE (DELTASONE) 5 MG tablet Take 5 mg by mouth daily.     traMADol (ULTRAM) 50 MG tablet Take 50 mg by mouth daily.     ZINC OXIDE PO Take 1 tablet by mouth daily.     No current facility-administered medications for this visit.    Allergies as of 04/13/2023 - Review Complete 04/11/2023  Allergen Reaction Noted   Morphine Other (See Comments) 07/12/2018   Benzalkonium chloride Dermatitis 04/14/2014   Escitalopram  10/20/2014   Hydralazine  07/25/2018   Neomycin-bacitracin zn-polymyx Dermatitis 06/08/2008   Bacitracin-neomycin-polymyxin Dermatitis 06/08/2008    Past Medical History:  Diagnosis Date   Breast cancer (HCC) 1985   bilateral mastectomies   Depression    1 time specific situation that she did not know how to deal wiht   Diverticulosis    Hiatal hernia    History of Bell's palsy    History of colon polyps    History of COVID-19    History of nephrolithiasis    History of pancreatitis    Hyperlipidemia    Hypertension, essential 01/10/2023   IBS (irritable bowel syndrome)    Osteoporosis  osteopenia in neck   Skin cancer    Temporal arteritis (HCC)     Past Surgical History:  Procedure Laterality Date   AUGMENTATION MAMMAPLASTY Bilateral 2000   COLON RESECTION SIGMOID N/A 10/13/2017   Procedure: COLON RESECTION SIGMOID;  Surgeon: Carolan Shiver, MD;  Location: [facility] ORS;  Service: General;  Laterality: N/A;   COLOSTOMY N/A 10/13/2017   Procedure: COLOSTOMY;  Surgeon: Carolan Shiver, MD;  Location: [facility] ORS;  Service: General;  Laterality: N/A;   LAPAROSCOPIC CHOLECYSTECTOMY  2009   MASTECTOMY  1983   bilateral    Review of Systems:    All systems reviewed and negative except where noted in HPI.   Physical  Examination:   There were no vitals taken for this visit.  General: Well-nourished, well-developed in no acute distress.  Lungs: Clear to auscultation bilaterally. Non-labored. Heart: Regular rate and rhythm, no murmurs rubs or gallops.  Abdomen: Bowel sounds are normal; Abdomen is Soft; No hepatosplenomegaly, masses or hernias;  No Abdominal Tenderness; No guarding or rebound tenderness. Neuro: Alert and oriented x 3.  Grossly intact.  Psych: Alert and cooperative, normal mood and affect.   Imaging Studies: DG Chest Port 1 View  Result Date: 04/11/2023 CLINICAL DATA:  Left lower quadrant abdominal pain.  Sepsis. EXAM: PORTABLE CHEST 1 VIEW COMPARISON:  09/21/2017. FINDINGS: Bilateral lung fields are clear. Bilateral costophrenic angles are clear. Normal cardio-mediastinal silhouette. No acute osseous abnormalities. The soft tissues are within normal limits. No free air under the domes of diaphragm. IMPRESSION: *No active disease. Electronically Signed   By: Jules Schick M.D.   On: 04/11/2023 15:01   CT ABDOMEN PELVIS W CONTRAST  Result Date: 04/11/2023 CLINICAL DATA:  Left lower quadrant abdominal pain. EXAM: CT ABDOMEN AND PELVIS WITH CONTRAST TECHNIQUE: Multidetector CT imaging of the abdomen and pelvis was performed using the standard protocol following bolus administration of intravenous contrast. RADIATION DOSE REDUCTION: This exam was performed according to the departmental dose-optimization program which includes automated exposure control, adjustment of the mA and/or kV according to patient size and/or use of iterative reconstruction technique. CONTRAST:  OMNIPAQUE IOHEXOL 300 MG/ML  SOLN COMPARISON:  MRI abdomen from 12/05/2022. FINDINGS: Lower chest: There are subpleural atelectatic changes in the visualized lung bases. No overt consolidation. No pleural effusion. The heart is normal in size. No pericardial effusion. Hepatobiliary: The liver is normal in size. Non-cirrhotic  configuration. No suspicious mass. These is mild diffuse hepatic steatosis. No intrahepatic bile duct dilation. There is mild prominence of the extrahepatic bile duct, most likely due to post cholecystectomy status. Gallbladder is surgically absent. Pancreas: Unremarkable. No pancreatic ductal dilatation or surrounding inflammatory changes. Spleen: Within normal limits. No focal lesion. Adrenals/Urinary Tract: Adrenal glands are unremarkable. 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 No suspicious renal mass. No hydronephrosis. There is a 4 mm nonobstructing calculus in the left kidney upper pole calyx and a 1 mm calculus in the left kidney lower pole calyx. There is a 1 mm nonobstructing calculus in the right kidney upper pole calyx. No other nephroureterolithiasis on either side. Unremarkable urinary bladder. Stomach/Bowel: There is a small sliding hiatal hernia. No disproportionate dilation of the small or large bowel loops. No evidence of abnormal bowel wall thickening or inflammatory changes. Rectosigmoid anastomotic suture noted. The appendix is unremarkable. There is an approximately 8 x 19 mm fluid-filled structure in the left para-aortic region just below the level of left renal vessels, which is unchanged since  the prior study and may represent a small proximal jejunal diverticulum. Vascular/Lymphatic: No ascites or pneumoperitoneum. No abdominal or pelvic lymphadenopathy, by size criteria. No aneurysmal dilation of the major abdominal arteries. There are moderate peripheral atherosclerotic vascular calcifications of the aorta and its major branches. Reproductive: The uterus is unremarkable. No large adnexal mass. Other: Infraumbilical midline surgical scar noted. The soft tissues and abdominal wall are otherwise unremarkable. Musculoskeletal: No suspicious osseous lesions. There are mild multilevel degenerative changes in the visualized spine. IMPRESSION:  1. No acute inflammatory process identified within the abdomen or pelvis. 2. Multiple other nonacute observations, as described above. Electronically Signed   By: Jules Schick M.D.   On: 04/11/2023 11:12    Assessment and Plan:   [name] is a 88 y.o. y/o female ***    Tonya Amy, PA-C  Follow up ***  BP check ***

## 2023-04-13 ENCOUNTER — Ambulatory Visit: Payer: Medicare Other | Admitting: Physician Assistant

## 2023-04-16 ENCOUNTER — Other Ambulatory Visit: Payer: Self-pay

## 2023-04-17 ENCOUNTER — Inpatient Hospital Stay: Payer: Medicare Other

## 2023-04-17 DIAGNOSIS — D708 Other neutropenia: Secondary | ICD-10-CM | POA: Diagnosis not present

## 2023-04-17 DIAGNOSIS — D72819 Decreased white blood cell count, unspecified: Secondary | ICD-10-CM

## 2023-04-17 LAB — CBC WITH DIFFERENTIAL (CANCER CENTER ONLY)
Abs Immature Granulocytes: 0 10*3/uL (ref 0.00–0.07)
Band Neutrophils: 1 %
Basophils Absolute: 0 10*3/uL (ref 0.0–0.1)
Basophils Relative: 1 %
Eosinophils Absolute: 0 10*3/uL (ref 0.0–0.5)
Eosinophils Relative: 1 %
HCT: 45.7 % (ref 36.0–46.0)
Hemoglobin: 14.7 g/dL (ref 12.0–15.0)
Lymphocytes Relative: 32 %
Lymphs Abs: 0.9 10*3/uL (ref 0.7–4.0)
MCH: 29.8 pg (ref 26.0–34.0)
MCHC: 32.2 g/dL (ref 30.0–36.0)
MCV: 92.5 fL (ref 80.0–100.0)
Metamyelocytes Relative: 1 %
Monocytes Absolute: 0.2 10*3/uL (ref 0.1–1.0)
Monocytes Relative: 6 %
Neutro Abs: 1.7 10*3/uL (ref 1.7–7.7)
Neutrophils Relative %: 58 %
Platelet Count: 258 10*3/uL (ref 150–400)
RBC: 4.94 MIL/uL (ref 3.87–5.11)
RDW: 15.4 % (ref 11.5–15.5)
Smear Review: NORMAL
WBC Count: 2.9 10*3/uL — ABNORMAL LOW (ref 4.0–10.5)
nRBC: 0 % (ref 0.0–0.2)

## 2023-04-17 NOTE — Progress Notes (Signed)
ANC is 1.7. Patient does not need Zaxio injection.

## 2023-04-18 ENCOUNTER — Ambulatory Visit: Payer: Medicare Other | Admitting: Physician Assistant

## 2023-04-18 ENCOUNTER — Encounter: Payer: Self-pay | Admitting: Physician Assistant

## 2023-04-18 VITALS — BP 147/70 | HR 80 | Temp 97.8°F | Ht 66.0 in | Wt 158.0 lb

## 2023-04-18 DIAGNOSIS — K5904 Chronic idiopathic constipation: Secondary | ICD-10-CM | POA: Diagnosis not present

## 2023-04-18 DIAGNOSIS — R1032 Left lower quadrant pain: Secondary | ICD-10-CM

## 2023-04-18 NOTE — Patient Instructions (Signed)
   For constipation: Start OTC Miralax Powder Mix 1 capful in 6 to 8 ounces of a drink once daily  Recommend high-fiber diet, 30 g of fiber daily Eat fruits, vegetables, and whole grains Drink 64 ounces of water / fluids daily.

## 2023-04-18 NOTE — Progress Notes (Signed)
[name], PA-C [address]  [address]  [place], [place] [address]  Main: [phone]  Fax: [phone]   Primary Care Physician: Gracelyn Nurse, MD  Primary Gastroenterologist:  [name], PA-C / Dr. Wyline Mood    CC: Follow-up chronic constipation and black stool  HPI: [name] is a 88 y.o. female returns for 1 month follow-up of chronic constipation and 1 episode of black stool.  1 month ago she tried Linzess 145 which caused explosive diarrhea and she discontinued.  She was not able to tolerate Linzess.  Most recently she has been taking Colace stool softener 100 mg once daily.  She tried it 1 dose of MiraLAX last week which helped.  She has not been consistent with taking MiraLAX.  Last bowel movement 5 days ago.  She has chronic intermittent discomfort in the LLQ.  No other abdominal pain.  Denies rectal bleeding.  No recent episodes of dark or black stools.  She has history of leukopenia.  She has blood work done through hematology weekly.  Lab 04/17/2023 (yesterday) showed normal hemoglobin 14.7.  No anemia.  04/11/2023: CT abdomen pelvis with contrast, to evaluate LLQ pain, showed no acute abnormality.  No diverticulitis or pancreatitis.  There was a Small sliding hiatal hernia.  Small jejunal diverticulum unchanged from previous scan.  Previous cholecystectomy and sigmoidectomy.    05/2012: Colonoscopy: Dr. [name] at [facility]: 2 small (3 mm to 5 mm) hyperplastic rectal polyps removed.  No repeat colonoscopy due to age.  2010: EGD: Small hiatal hernia, otherwise normal.  Biopsies negative for H. pylori.  Current Outpatient Medications  Medication Sig Dispense Refill   ALPRAZolam (XANAX) 0.25 MG tablet Take 0.25 mg by mouth 2 (two) times daily as needed.      amLODipine (NORVASC) 5 MG tablet Take 0.5 tablets (2.5 mg total) by mouth daily. Pt states that she she increased the tablet to 5 mg daily. She was taking 2.5 mg. She stated that she  thought the half tablet was not working efficiently. I asked the patient to inform her pcp that her dosage was increased.     Apoaequorin 10 MG CAPS Take 1 capsule by mouth daily.     Ascorbic Acid (VITAMIN C) 1000 MG tablet Take 1,000 mg by mouth daily.     aspirin EC 81 MG tablet Take 81 mg daily by mouth.     b complex vitamins capsule Take 1 capsule by mouth daily.     Biotin 1000 MCG tablet Take 1,000 mcg by mouth daily.      Cholecalciferol (VITAMIN D3) 2000 units capsule Take 2,000 Units by mouth daily.     Cyanocobalamin (B-12 PO) Take 2,500 mcg by mouth daily.     linaclotide (LINZESS) 145 MCG CAPS capsule Take 1 capsule (145 mcg total) by mouth daily before breakfast. 30 capsule 5   predniSONE (DELTASONE) 5 MG tablet Take 5 mg by mouth daily.     traMADol (ULTRAM) 50 MG tablet Take 50 mg by mouth daily.     ZINC OXIDE PO Take 1 tablet by mouth daily.     No current facility-administered medications for this visit.    Allergies as of 04/18/2023 - Review Complete 04/18/2023  Allergen Reaction Noted   Morphine Other (See Comments) 07/12/2018   Benzalkonium chloride Dermatitis 04/14/2014   Escitalopram  10/20/2014   Hydralazine  07/25/2018   Neomycin-bacitracin zn-polymyx Dermatitis 06/08/2008   Bacitracin-neomycin-polymyxin Dermatitis 06/08/2008    Past  Medical History:  Diagnosis Date   Breast cancer [facility]) 1985   bilateral mastectomies   Depression    1 time specific situation that she did not know how to deal wiht   Diverticulosis    Hiatal hernia    History of Bell's palsy    History of colon polyps    History of COVID-19    History of nephrolithiasis    History of pancreatitis    Hyperlipidemia    Hypertension, essential 01/10/2023   IBS (irritable bowel syndrome)    Osteoporosis    osteopenia in neck   Skin cancer    Temporal arteritis (HCC)     Past Surgical History:  Procedure Laterality Date   AUGMENTATION MAMMAPLASTY Bilateral 2000   COLON RESECTION  SIGMOID N/A 10/13/2017   Procedure: COLON RESECTION SIGMOID;  Surgeon: Carolan Shiver, MD;  Location: [facility] ORS;  Service: General;  Laterality: N/A;   COLOSTOMY N/A 10/13/2017   Procedure: COLOSTOMY;  Surgeon: Carolan Shiver, MD;  Location: [facility] ORS;  Service: General;  Laterality: N/A;   LAPAROSCOPIC CHOLECYSTECTOMY  2009   MASTECTOMY  1983   bilateral    Review of Systems:    All systems reviewed and negative except where noted in HPI.   Physical Examination:   BP (!) 147/70   Pulse 80   Temp 97.8 F (36.6 C)   Ht 5\' 6"  (1.676 m)   Wt 158 lb (71.7 kg)   BMI 25.50 kg/m   General: Well-nourished, well-developed in no acute distress.  Lungs: Clear to auscultation bilaterally. Non-labored. Heart: Regular rate and rhythm, no murmurs rubs or gallops.  Abdomen: Bowel sounds are normal; Abdomen is Soft; No hepatosplenomegaly, masses or hernias;  Mild LLQ Abdominal Tenderness; rest of abdomen is nontender.  No guarding or rebound tenderness. Neuro: Alert and oriented x 3.  Grossly intact.  Psych: Alert and cooperative, normal mood and affect.   Imaging Studies: DG Chest Port 1 View  Result Date: 04/11/2023 CLINICAL DATA:  Left lower quadrant abdominal pain.  Sepsis. EXAM: PORTABLE CHEST 1 VIEW COMPARISON:  09/21/2017. FINDINGS: Bilateral lung fields are clear. Bilateral costophrenic angles are clear. Normal cardio-mediastinal silhouette. No acute osseous abnormalities. The soft tissues are within normal limits. No free air under the domes of diaphragm. IMPRESSION: *No active disease. Electronically Signed   By: Jules Schick M.D.   On: 04/11/2023 15:01   CT ABDOMEN PELVIS W CONTRAST  Result Date: 04/11/2023 CLINICAL DATA:  Left lower quadrant abdominal pain. EXAM: CT ABDOMEN AND PELVIS WITH CONTRAST TECHNIQUE: Multidetector CT imaging of the abdomen and pelvis was performed using the standard protocol following bolus administration of intravenous contrast. RADIATION DOSE  REDUCTION: This exam was performed according to the departmental dose-optimization program which includes automated exposure control, adjustment of the mA and/or kV according to patient size and/or use of iterative reconstruction technique. CONTRAST:  OMNIPAQUE IOHEXOL 300 MG/ML  SOLN COMPARISON:  MRI abdomen from 12/05/2022. FINDINGS: Lower chest: There are subpleural atelectatic changes in the visualized lung bases. No overt consolidation. No pleural effusion. The heart is normal in size. No pericardial effusion. Hepatobiliary: The liver is normal in size. Non-cirrhotic configuration. No suspicious mass. These is mild diffuse hepatic steatosis. No intrahepatic bile duct dilation. There is mild prominence of the extrahepatic bile duct, most likely due to post cholecystectomy status. Gallbladder is surgically absent. Pancreas: Unremarkable. No pancreatic ductal dilatation or surrounding inflammatory changes. Spleen: Within normal limits. No focal lesion. Adrenals/Urinary Tract: Adrenal glands are unremarkable. 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 No suspicious renal mass. No hydronephrosis. There is a 4 mm nonobstructing calculus in the left kidney upper pole calyx and a 1 mm calculus in the left kidney lower pole calyx. There is a 1 mm nonobstructing calculus in the right kidney upper pole calyx. No other nephroureterolithiasis on either side. Unremarkable urinary bladder. Stomach/Bowel: There is a small sliding hiatal hernia. No disproportionate dilation of the small or large bowel loops. No evidence of abnormal bowel wall thickening or inflammatory changes. Rectosigmoid anastomotic suture noted. The appendix is unremarkable. There is an approximately 8 x 19 mm fluid-filled structure in the left para-aortic region just below the level of left renal vessels, which is unchanged since the prior study and may represent a small proximal jejunal diverticulum.  Vascular/Lymphatic: No ascites or pneumoperitoneum. No abdominal or pelvic lymphadenopathy, by size criteria. No aneurysmal dilation of the major abdominal arteries. There are moderate peripheral atherosclerotic vascular calcifications of the aorta and its major branches. Reproductive: The uterus is unremarkable. No large adnexal mass. Other: Infraumbilical midline surgical scar noted. The soft tissues and abdominal wall are otherwise unremarkable. Musculoskeletal: No suspicious osseous lesions. There are mild multilevel degenerative changes in the visualized spine. IMPRESSION: 1. No acute inflammatory process identified within the abdomen or pelvis. 2. Multiple other nonacute observations, as described above. Electronically Signed   By: Jules Schick M.D.   On: 04/11/2023 11:12    Assessment and Plan:   [name] is a 88 y.o. y/o female returns for follow-up of chronic constipation and chronic LLQ pain.  Recent abdominal pelvic CT showed no acute abnormality.  She could not tolerate Linzess which caused explosive diarrhea.  Constipation has improved on MiraLAX and Colace stool softener.  1.  Chronic idiopathic constipation  Encouraged her to take MiraLAX 1 capful in a drink every day.  Continue Colace stool softener 100 mg once daily.  Eat high-fiber diet with fruits, vegetables, whole grains.  Drink 64 ounces of fluids water daily.  Reassurance.  [name], PA-C  Follow up as needed if she has any recurrent or worsening GI symptoms.

## 2023-04-24 ENCOUNTER — Inpatient Hospital Stay: Payer: Medicare Other

## 2023-04-24 DIAGNOSIS — D708 Other neutropenia: Secondary | ICD-10-CM

## 2023-04-24 DIAGNOSIS — D72819 Decreased white blood cell count, unspecified: Secondary | ICD-10-CM

## 2023-04-24 LAB — CBC WITH DIFFERENTIAL (CANCER CENTER ONLY)
Abs Immature Granulocytes: 0.01 10*3/uL (ref 0.00–0.07)
Basophils Absolute: 0 10*3/uL (ref 0.0–0.1)
Basophils Relative: 1 %
Eosinophils Absolute: 0 10*3/uL (ref 0.0–0.5)
Eosinophils Relative: 1 %
HCT: 45.7 % (ref 36.0–46.0)
Hemoglobin: 14.2 g/dL (ref 12.0–15.0)
Immature Granulocytes: 0 %
Lymphocytes Relative: 50 %
Lymphs Abs: 1.4 10*3/uL (ref 0.7–4.0)
MCH: 29.2 pg (ref 26.0–34.0)
MCHC: 31.1 g/dL (ref 30.0–36.0)
MCV: 94 fL (ref 80.0–100.0)
Monocytes Absolute: 0.3 10*3/uL (ref 0.1–1.0)
Monocytes Relative: 10 %
Neutro Abs: 1 10*3/uL — ABNORMAL LOW (ref 1.7–7.7)
Neutrophils Relative %: 38 %
Platelet Count: 207 10*3/uL (ref 150–400)
RBC: 4.86 MIL/uL (ref 3.87–5.11)
RDW: 15 % (ref 11.5–15.5)
WBC Count: 2.7 10*3/uL — ABNORMAL LOW (ref 4.0–10.5)
nRBC: 0 % (ref 0.0–0.2)

## 2023-04-24 MED ORDER — FILGRASTIM-SNDZ 480 MCG/0.8ML IJ SOSY
480.0000 ug | PREFILLED_SYRINGE | Freq: Once | INTRAMUSCULAR | Status: AC
  Administered 2023-04-24: 480 ug via SUBCUTANEOUS
  Filled 2023-04-24: qty 0.8

## 2023-05-01 ENCOUNTER — Inpatient Hospital Stay: Payer: Medicare Other | Attending: Internal Medicine

## 2023-05-01 ENCOUNTER — Inpatient Hospital Stay: Payer: Medicare Other

## 2023-05-01 DIAGNOSIS — Z79899 Other long term (current) drug therapy: Secondary | ICD-10-CM | POA: Diagnosis not present

## 2023-05-01 DIAGNOSIS — Z811 Family history of alcohol abuse and dependence: Secondary | ICD-10-CM | POA: Diagnosis not present

## 2023-05-01 DIAGNOSIS — Z87891 Personal history of nicotine dependence: Secondary | ICD-10-CM | POA: Insufficient documentation

## 2023-05-01 DIAGNOSIS — M359 Systemic involvement of connective tissue, unspecified: Secondary | ICD-10-CM | POA: Insufficient documentation

## 2023-05-01 DIAGNOSIS — Z800 Family history of malignant neoplasm of digestive organs: Secondary | ICD-10-CM | POA: Insufficient documentation

## 2023-05-01 DIAGNOSIS — D708 Other neutropenia: Secondary | ICD-10-CM | POA: Diagnosis present

## 2023-05-01 DIAGNOSIS — D72819 Decreased white blood cell count, unspecified: Secondary | ICD-10-CM

## 2023-05-01 DIAGNOSIS — Z840 Family history of diseases of the skin and subcutaneous tissue: Secondary | ICD-10-CM | POA: Insufficient documentation

## 2023-05-01 LAB — CBC WITH DIFFERENTIAL (CANCER CENTER ONLY)
Abs Immature Granulocytes: 0.06 10*3/uL (ref 0.00–0.07)
Basophils Absolute: 0 10*3/uL (ref 0.0–0.1)
Basophils Relative: 1 %
Eosinophils Absolute: 0 10*3/uL (ref 0.0–0.5)
Eosinophils Relative: 1 %
HCT: 45.7 % (ref 36.0–46.0)
Hemoglobin: 14.2 g/dL (ref 12.0–15.0)
Immature Granulocytes: 2 %
Lymphocytes Relative: 30 %
Lymphs Abs: 0.9 10*3/uL (ref 0.7–4.0)
MCH: 29.3 pg (ref 26.0–34.0)
MCHC: 31.1 g/dL (ref 30.0–36.0)
MCV: 94.2 fL (ref 80.0–100.0)
Monocytes Absolute: 0.2 10*3/uL (ref 0.1–1.0)
Monocytes Relative: 7 %
Neutro Abs: 1.8 10*3/uL (ref 1.7–7.7)
Neutrophils Relative %: 59 %
Platelet Count: 212 10*3/uL (ref 150–400)
RBC: 4.85 MIL/uL (ref 3.87–5.11)
RDW: 15.2 % (ref 11.5–15.5)
Smear Review: NORMAL
WBC Count: 3.1 10*3/uL — ABNORMAL LOW (ref 4.0–10.5)
nRBC: 0 % (ref 0.0–0.2)

## 2023-05-08 ENCOUNTER — Inpatient Hospital Stay: Payer: Medicare Other

## 2023-05-08 DIAGNOSIS — D72819 Decreased white blood cell count, unspecified: Secondary | ICD-10-CM

## 2023-05-08 DIAGNOSIS — D708 Other neutropenia: Secondary | ICD-10-CM

## 2023-05-08 LAB — CBC WITH DIFFERENTIAL (CANCER CENTER ONLY)
Abs Immature Granulocytes: 0.02 10*3/uL (ref 0.00–0.07)
Basophils Absolute: 0 10*3/uL (ref 0.0–0.1)
Basophils Relative: 1 %
Eosinophils Absolute: 0 10*3/uL (ref 0.0–0.5)
Eosinophils Relative: 2 %
HCT: 45.2 % (ref 36.0–46.0)
Hemoglobin: 14.1 g/dL (ref 12.0–15.0)
Immature Granulocytes: 1 %
Lymphocytes Relative: 50 %
Lymphs Abs: 1.4 10*3/uL (ref 0.7–4.0)
MCH: 29.2 pg (ref 26.0–34.0)
MCHC: 31.2 g/dL (ref 30.0–36.0)
MCV: 93.6 fL (ref 80.0–100.0)
Monocytes Absolute: 0.4 10*3/uL (ref 0.1–1.0)
Monocytes Relative: 13 %
Neutro Abs: 0.9 10*3/uL — ABNORMAL LOW (ref 1.7–7.7)
Neutrophils Relative %: 33 %
Platelet Count: 220 10*3/uL (ref 150–400)
RBC: 4.83 MIL/uL (ref 3.87–5.11)
RDW: 15.4 % (ref 11.5–15.5)
WBC Count: 2.7 10*3/uL — ABNORMAL LOW (ref 4.0–10.5)
nRBC: 0 % (ref 0.0–0.2)

## 2023-05-08 MED ORDER — FILGRASTIM-SNDZ 480 MCG/0.8ML IJ SOSY
480.0000 ug | PREFILLED_SYRINGE | Freq: Once | INTRAMUSCULAR | Status: AC
  Administered 2023-05-08: 480 ug via SUBCUTANEOUS
  Filled 2023-05-08: qty 0.8

## 2023-05-15 ENCOUNTER — Inpatient Hospital Stay: Payer: Medicare Other

## 2023-05-15 DIAGNOSIS — D708 Other neutropenia: Secondary | ICD-10-CM

## 2023-05-15 LAB — BASIC METABOLIC PANEL - CANCER CENTER ONLY
Anion gap: 9 (ref 5–15)
BUN: 16 mg/dL (ref 8–23)
CO2: 28 mmol/L (ref 22–32)
Calcium: 9.1 mg/dL (ref 8.9–10.3)
Chloride: 105 mmol/L (ref 98–111)
Creatinine: 1.03 mg/dL — ABNORMAL HIGH (ref 0.44–1.00)
GFR, Estimated: 53 mL/min — ABNORMAL LOW (ref >60)
Glucose, Bld: 92 mg/dL (ref 70–99)
Potassium: 4 mmol/L (ref 3.5–5.1)
Sodium: 142 mmol/L (ref 135–145)

## 2023-05-15 LAB — CBC WITH DIFFERENTIAL (CANCER CENTER ONLY)
Abs Immature Granulocytes: 0.05 10*3/uL (ref 0.00–0.07)
Basophils Absolute: 0 10*3/uL (ref 0.0–0.1)
Basophils Relative: 1 %
Eosinophils Absolute: 0 10*3/uL (ref 0.0–0.5)
Eosinophils Relative: 1 %
HCT: 44.6 % (ref 36.0–46.0)
Hemoglobin: 14.1 g/dL (ref 12.0–15.0)
Immature Granulocytes: 2 %
Lymphocytes Relative: 30 %
Lymphs Abs: 0.9 10*3/uL (ref 0.7–4.0)
MCH: 29.2 pg (ref 26.0–34.0)
MCHC: 31.6 g/dL (ref 30.0–36.0)
MCV: 92.3 fL (ref 80.0–100.0)
Monocytes Absolute: 0.3 10*3/uL (ref 0.1–1.0)
Monocytes Relative: 9 %
Neutro Abs: 1.6 10*3/uL — ABNORMAL LOW (ref 1.7–7.7)
Neutrophils Relative %: 57 %
Platelet Count: 219 10*3/uL (ref 150–400)
RBC: 4.83 MIL/uL (ref 3.87–5.11)
RDW: 15 % (ref 11.5–15.5)
Smear Review: NORMAL
WBC Count: 2.9 10*3/uL — ABNORMAL LOW (ref 4.0–10.5)
nRBC: 0 % (ref 0.0–0.2)

## 2023-05-15 LAB — VITAMIN B12: Vitamin B-12: 4350 pg/mL — ABNORMAL HIGH (ref 180–914)

## 2023-05-15 NOTE — Progress Notes (Signed)
Hold injection. ANC 1.6

## 2023-05-16 ENCOUNTER — Ambulatory Visit: Payer: Medicare Other | Admitting: Physician Assistant

## 2023-05-22 ENCOUNTER — Inpatient Hospital Stay: Payer: Medicare Other

## 2023-05-22 DIAGNOSIS — D708 Other neutropenia: Secondary | ICD-10-CM

## 2023-05-22 DIAGNOSIS — D72819 Decreased white blood cell count, unspecified: Secondary | ICD-10-CM

## 2023-05-22 LAB — CBC WITH DIFFERENTIAL (CANCER CENTER ONLY)
Abs Immature Granulocytes: 0.03 10*3/uL (ref 0.00–0.07)
Basophils Absolute: 0 10*3/uL (ref 0.0–0.1)
Basophils Relative: 1 %
Eosinophils Absolute: 0 10*3/uL (ref 0.0–0.5)
Eosinophils Relative: 1 %
HCT: 47.3 % — ABNORMAL HIGH (ref 36.0–46.0)
Hemoglobin: 14.8 g/dL (ref 12.0–15.0)
Immature Granulocytes: 1 %
Lymphocytes Relative: 34 %
Lymphs Abs: 1.1 10*3/uL (ref 0.7–4.0)
MCH: 29.3 pg (ref 26.0–34.0)
MCHC: 31.3 g/dL (ref 30.0–36.0)
MCV: 93.7 fL (ref 80.0–100.0)
Monocytes Absolute: 0.3 10*3/uL (ref 0.1–1.0)
Monocytes Relative: 10 %
Neutro Abs: 1.7 10*3/uL (ref 1.7–7.7)
Neutrophils Relative %: 53 %
Platelet Count: 229 10*3/uL (ref 150–400)
RBC: 5.05 MIL/uL (ref 3.87–5.11)
RDW: 14.9 % (ref 11.5–15.5)
WBC Count: 3.2 10*3/uL — ABNORMAL LOW (ref 4.0–10.5)
nRBC: 0 % (ref 0.0–0.2)

## 2023-05-22 MED ORDER — FILGRASTIM-SNDZ 480 MCG/0.8ML IJ SOSY
480.0000 ug | PREFILLED_SYRINGE | Freq: Once | INTRAMUSCULAR | Status: DC
Start: 2023-05-22 — End: 2023-05-22

## 2023-05-22 NOTE — Addendum Note (Signed)
Addended by: Sharen Hones on: 05/22/2023 08:38 AM   Modules accepted: Orders

## 2023-05-22 NOTE — Progress Notes (Signed)
This morning the patient didn't have to get her Zarxio injection. The order in her chart says give if ANC < 1.5. Pt ANC, and this morning her ANC was 1.7, I informed the patient of this info and she was thrilled.

## 2023-05-29 ENCOUNTER — Inpatient Hospital Stay: Payer: Medicare Other

## 2023-05-29 DIAGNOSIS — D72819 Decreased white blood cell count, unspecified: Secondary | ICD-10-CM

## 2023-05-29 DIAGNOSIS — D708 Other neutropenia: Secondary | ICD-10-CM

## 2023-05-29 LAB — CBC WITH DIFFERENTIAL (CANCER CENTER ONLY)
Abs Immature Granulocytes: 0.04 10*3/uL (ref 0.00–0.07)
Basophils Absolute: 0 10*3/uL (ref 0.0–0.1)
Basophils Relative: 2 %
Eosinophils Absolute: 0 10*3/uL (ref 0.0–0.5)
Eosinophils Relative: 1 %
HCT: 45.1 % (ref 36.0–46.0)
Hemoglobin: 13.9 g/dL (ref 12.0–15.0)
Immature Granulocytes: 2 %
Lymphocytes Relative: 44 %
Lymphs Abs: 0.9 10*3/uL (ref 0.7–4.0)
MCH: 29.1 pg (ref 26.0–34.0)
MCHC: 30.8 g/dL (ref 30.0–36.0)
MCV: 94.5 fL (ref 80.0–100.0)
Monocytes Absolute: 0.2 10*3/uL (ref 0.1–1.0)
Monocytes Relative: 10 %
Neutro Abs: 0.8 10*3/uL — ABNORMAL LOW (ref 1.7–7.7)
Neutrophils Relative %: 41 %
Platelet Count: 241 10*3/uL (ref 150–400)
RBC: 4.77 MIL/uL (ref 3.87–5.11)
RDW: 14.6 % (ref 11.5–15.5)
WBC Count: 2 10*3/uL — ABNORMAL LOW (ref 4.0–10.5)
nRBC: 0 % (ref 0.0–0.2)

## 2023-05-29 MED ORDER — FILGRASTIM-SNDZ 480 MCG/0.8ML IJ SOSY: 480.0000 ug | PREFILLED_SYRINGE | Freq: Once | INTRAMUSCULAR | Status: DC

## 2023-06-05 ENCOUNTER — Inpatient Hospital Stay: Payer: Medicare Other | Attending: Internal Medicine

## 2023-06-05 ENCOUNTER — Inpatient Hospital Stay: Payer: Medicare Other

## 2023-06-05 DIAGNOSIS — I10 Essential (primary) hypertension: Secondary | ICD-10-CM | POA: Diagnosis not present

## 2023-06-05 DIAGNOSIS — Z79899 Other long term (current) drug therapy: Secondary | ICD-10-CM | POA: Diagnosis not present

## 2023-06-05 DIAGNOSIS — Z87442 Personal history of urinary calculi: Secondary | ICD-10-CM | POA: Diagnosis not present

## 2023-06-05 DIAGNOSIS — Z840 Family history of diseases of the skin and subcutaneous tissue: Secondary | ICD-10-CM | POA: Insufficient documentation

## 2023-06-05 DIAGNOSIS — D72819 Decreased white blood cell count, unspecified: Secondary | ICD-10-CM

## 2023-06-05 DIAGNOSIS — Z8616 Personal history of COVID-19: Secondary | ICD-10-CM | POA: Insufficient documentation

## 2023-06-05 DIAGNOSIS — Z885 Allergy status to narcotic agent status: Secondary | ICD-10-CM | POA: Insufficient documentation

## 2023-06-05 DIAGNOSIS — Z800 Family history of malignant neoplasm of digestive organs: Secondary | ICD-10-CM | POA: Diagnosis not present

## 2023-06-05 DIAGNOSIS — Z9049 Acquired absence of other specified parts of digestive tract: Secondary | ICD-10-CM | POA: Diagnosis not present

## 2023-06-05 DIAGNOSIS — Z7952 Long term (current) use of systemic steroids: Secondary | ICD-10-CM | POA: Insufficient documentation

## 2023-06-05 DIAGNOSIS — Z811 Family history of alcohol abuse and dependence: Secondary | ICD-10-CM | POA: Insufficient documentation

## 2023-06-05 DIAGNOSIS — Z85828 Personal history of other malignant neoplasm of skin: Secondary | ICD-10-CM | POA: Insufficient documentation

## 2023-06-05 DIAGNOSIS — Z853 Personal history of malignant neoplasm of breast: Secondary | ICD-10-CM | POA: Diagnosis not present

## 2023-06-05 DIAGNOSIS — D708 Other neutropenia: Secondary | ICD-10-CM | POA: Diagnosis present

## 2023-06-05 DIAGNOSIS — E785 Hyperlipidemia, unspecified: Secondary | ICD-10-CM | POA: Diagnosis not present

## 2023-06-05 DIAGNOSIS — Z860100 Personal history of colon polyps, unspecified: Secondary | ICD-10-CM | POA: Diagnosis not present

## 2023-06-05 DIAGNOSIS — Z9013 Acquired absence of bilateral breasts and nipples: Secondary | ICD-10-CM | POA: Insufficient documentation

## 2023-06-05 DIAGNOSIS — M316 Other giant cell arteritis: Secondary | ICD-10-CM | POA: Insufficient documentation

## 2023-06-05 DIAGNOSIS — Z87891 Personal history of nicotine dependence: Secondary | ICD-10-CM | POA: Insufficient documentation

## 2023-06-05 LAB — CBC WITH DIFFERENTIAL (CANCER CENTER ONLY)
Abs Immature Granulocytes: 0.05 10*3/uL (ref 0.00–0.07)
Basophils Absolute: 0 10*3/uL (ref 0.0–0.1)
Basophils Relative: 1 %
Eosinophils Absolute: 0.1 10*3/uL (ref 0.0–0.5)
Eosinophils Relative: 2 %
HCT: 45.3 % (ref 36.0–46.0)
Hemoglobin: 14.1 g/dL (ref 12.0–15.0)
Immature Granulocytes: 2 %
Lymphocytes Relative: 39 %
Lymphs Abs: 1 10*3/uL (ref 0.7–4.0)
MCH: 29.2 pg (ref 26.0–34.0)
MCHC: 31.1 g/dL (ref 30.0–36.0)
MCV: 93.8 fL (ref 80.0–100.0)
Monocytes Absolute: 0.2 10*3/uL (ref 0.1–1.0)
Monocytes Relative: 8 %
Neutro Abs: 1.2 10*3/uL — ABNORMAL LOW (ref 1.7–7.7)
Neutrophils Relative %: 48 %
Platelet Count: 229 10*3/uL (ref 150–400)
RBC: 4.83 MIL/uL (ref 3.87–5.11)
RDW: 14.8 % (ref 11.5–15.5)
WBC Count: 2.5 10*3/uL — ABNORMAL LOW (ref 4.0–10.5)
nRBC: 0 % (ref 0.0–0.2)

## 2023-06-05 MED ORDER — FILGRASTIM-SNDZ 480 MCG/0.8ML IJ SOSY
480.0000 ug | PREFILLED_SYRINGE | Freq: Once | INTRAMUSCULAR | Status: AC
Start: 2023-06-05 — End: 2023-06-05
  Administered 2023-06-05: 480 ug via SUBCUTANEOUS
  Filled 2023-06-05: qty 0.8

## 2023-06-08 ENCOUNTER — Other Ambulatory Visit: Payer: Self-pay | Admitting: *Deleted

## 2023-06-08 DIAGNOSIS — D72819 Decreased white blood cell count, unspecified: Secondary | ICD-10-CM

## 2023-06-12 ENCOUNTER — Inpatient Hospital Stay: Payer: Medicare Other

## 2023-06-12 ENCOUNTER — Encounter: Payer: Self-pay | Admitting: Internal Medicine

## 2023-06-12 ENCOUNTER — Inpatient Hospital Stay (HOSPITAL_BASED_OUTPATIENT_CLINIC_OR_DEPARTMENT_OTHER): Payer: Medicare Other | Admitting: Internal Medicine

## 2023-06-12 VITALS — BP 135/57 | HR 84 | Temp 98.2°F | Ht 66.0 in | Wt 161.2 lb

## 2023-06-12 DIAGNOSIS — I776 Arteritis, unspecified: Secondary | ICD-10-CM

## 2023-06-12 DIAGNOSIS — D72819 Decreased white blood cell count, unspecified: Secondary | ICD-10-CM

## 2023-06-12 DIAGNOSIS — D708 Other neutropenia: Secondary | ICD-10-CM | POA: Diagnosis not present

## 2023-06-12 LAB — CBC WITH DIFFERENTIAL (CANCER CENTER ONLY)
Abs Immature Granulocytes: 0.04 10*3/uL (ref 0.00–0.07)
Basophils Absolute: 0 10*3/uL (ref 0.0–0.1)
Basophils Relative: 0 %
Eosinophils Absolute: 0 10*3/uL (ref 0.0–0.5)
Eosinophils Relative: 0 %
HCT: 45.3 % (ref 36.0–46.0)
Hemoglobin: 14.5 g/dL (ref 12.0–15.0)
Immature Granulocytes: 1 %
Lymphocytes Relative: 30 %
Lymphs Abs: 1 10*3/uL (ref 0.7–4.0)
MCH: 29.7 pg (ref 26.0–34.0)
MCHC: 32 g/dL (ref 30.0–36.0)
MCV: 92.6 fL (ref 80.0–100.0)
Monocytes Absolute: 0.2 10*3/uL (ref 0.1–1.0)
Monocytes Relative: 7 %
Neutro Abs: 2 10*3/uL (ref 1.7–7.7)
Neutrophils Relative %: 62 %
Platelet Count: 214 10*3/uL (ref 150–400)
RBC: 4.89 MIL/uL (ref 3.87–5.11)
RDW: 14.9 % (ref 11.5–15.5)
Smear Review: NORMAL
WBC Count: 3.3 10*3/uL — ABNORMAL LOW (ref 4.0–10.5)
nRBC: 0 % (ref 0.0–0.2)

## 2023-06-12 LAB — BASIC METABOLIC PANEL - CANCER CENTER ONLY
Anion gap: 12 (ref 5–15)
BUN: 21 mg/dL (ref 8–23)
CO2: 26 mmol/L (ref 22–32)
Calcium: 8.7 mg/dL — ABNORMAL LOW (ref 8.9–10.3)
Chloride: 101 mmol/L (ref 98–111)
Creatinine: 0.98 mg/dL (ref 0.44–1.00)
GFR, Estimated: 56 mL/min — ABNORMAL LOW (ref >60)
Glucose, Bld: 101 mg/dL — ABNORMAL HIGH (ref 70–99)
Potassium: 4 mmol/L (ref 3.5–5.1)
Sodium: 139 mmol/L (ref 135–145)

## 2023-06-12 LAB — VITAMIN B12: Vitamin B-12: 3092 pg/mL — ABNORMAL HIGH (ref 180–914)

## 2023-06-12 MED ORDER — FILGRASTIM-SNDZ 480 MCG/0.8ML IJ SOSY
480.0000 ug | PREFILLED_SYRINGE | Freq: Once | INTRAMUSCULAR | Status: DC
Start: 2023-06-12 — End: 2023-06-12

## 2023-06-12 NOTE — Progress Notes (Signed)
 No concerns today

## 2023-06-12 NOTE — Progress Notes (Signed)
 [facility] CONSULT NOTE  Patient Care Team: Rudolpho Norleen BIRCH, MD as PCP - General (Internal Medicine) Maryl Zachary LELON Mickey., MD (Rheumatology) Dasher, Alm LABOR, MD (Dermatology) Rennie Cindy SAUNDERS, MD as Consulting Physician (Hematology and Oncology) Milissa Hamming, MD as Consulting Physician (Otolaryngology)  CHIEF COMPLAINTS/PURPOSE OF CONSULTATION: Autoimmune neutropenia  #April 2019- Autoimmune neutropenia-weekly [facility] [Dr.Crocoran]; BMBx- no malignancy [Bone marrow aspirate and biopsy on 09/04/2017 revealed a hypercellular marrow for age with trilineage hematopoiesis.  There was abundant mature neutrophils.  Significant dyspoiesis or increased blasts was not identified.  Flow cytometry revealed a predominance of T lymphocytes (16% of all cells) with no abnormal phenotype.  There was a minor B-cell population (13% of lymphocytes) with slight kappa light chain excess.  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    HISTORY OF PRESENTING ILLNESS: Alone.  Ambulating independently.  [name] 89 y.o.  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  Patient has no concerns today and is feeling well. Patient has not needed any hospitalizations. Patient continues to complain of easy bruising.  No recent infections.   Review of Systems  Constitutional:  Negative for chills, diaphoresis, fever, malaise/fatigue and weight loss.  HENT:  Negative for nosebleeds and sore throat.   Eyes:  Negative for double vision.  Respiratory:   Negative for cough, hemoptysis, sputum production, shortness of breath and wheezing.   Cardiovascular:  Negative for chest pain, palpitations and orthopnea.  Gastrointestinal:  Negative for blood in stool, constipation, diarrhea, heartburn, melena, nausea and vomiting.  Genitourinary:  Negative for dysuria, frequency and urgency.  Musculoskeletal:  Negative for back pain and joint pain.  Skin:  Negative for itching.  Neurological:  Positive for tingling. Negative for dizziness, focal weakness, weakness and headaches.  Endo/Heme/Allergies:  Bruises/bleeds easily.  Psychiatric/Behavioral:  Negative for depression. The patient is nervous/anxious. The patient does not have insomnia.      MEDICAL HISTORY:  Past Medical History:  Diagnosis Date   Breast cancer [facility]) 1985   bilateral mastectomies   Depression    1 time specific situation that she did not know how to deal wiht   Diverticulosis    Hiatal hernia    History of Bell's palsy    History of colon polyps    History of COVID-19    History of nephrolithiasis    History of pancreatitis    Hyperlipidemia    Hypertension, essential 01/10/2023   IBS (irritable bowel syndrome)    Osteoporosis    osteopenia in neck   Skin cancer    Temporal arteritis (HCC)     SURGICAL HISTORY: Past Surgical History:  Procedure Laterality Date   AUGMENTATION MAMMAPLASTY Bilateral 2000   COLON RESECTION SIGMOID N/A 10/13/2017   Procedure: COLON RESECTION SIGMOID;  Surgeon: Rodolph Romano, MD;  Location: [facility] ORS;  Service: General;  Laterality: N/A;   COLOSTOMY N/A 10/13/2017   Procedure: COLOSTOMY;  Surgeon: Rodolph Romano, MD;  Location: [facility] ORS;  Service: General;  Laterality: N/A;   LAPAROSCOPIC CHOLECYSTECTOMY  2009   MASTECTOMY  1983   bilateral    SOCIAL HISTORY: Social  History   Socioeconomic History   Marital status: Widowed    Spouse name: Not on file   Number of children: 2   Years of education: college   Highest  education level: Not on file  Occupational History   Occupation: public relations account executive   Occupation: Retired  Tobacco Use   Smoking status: Former    Current packs/day: 0.00    Average packs/day: 0.3 packs/day for 30.0 years (7.5 ttl pk-yrs)    Types: Cigarettes    Start date: 05/18/1975    Quit date: 05/17/2005    Years since quitting: 18.0   Smokeless tobacco: Never   Tobacco comments:    quit 11 years ago  Vaping Use   Vaping status: Never Used  Substance and Sexual Activity   Alcohol use: No    Alcohol/week: 0.0 standard drinks of alcohol   Drug use: No   Sexual activity: Not Currently  Other Topics Concern   Not on file  Social History Narrative   Patient lives at home alone.    Patient is retired.    Patient has some college.    Patient has 2 children.   Social Drivers of Corporate Investment Banker Strain: Not on file  Food Insecurity: No Food Insecurity (10/09/2022)   Hunger Vital Sign    Worried About Running Out of Food in the Last Year: Never true    Ran Out of Food in the Last Year: Never true  Transportation Needs: No Transportation Needs (10/09/2022)   PRAPARE - Administrator, Civil Service (Medical): No    Lack of Transportation (Non-Medical): No  Physical Activity: Not on file  Stress: Not on file  Social Connections: Not on file  Intimate Partner Violence: Not At Risk (10/09/2022)   Humiliation, Afraid, Rape, and Kick questionnaire    Fear of Current or Ex-Partner: No    Emotionally Abused: No    Physically Abused: No    Sexually Abused: No    FAMILY HISTORY: Family History  Problem Relation Age of Onset   Colon cancer Mother 66   Scleroderma Father 71   Alcoholism Brother    Stomach cancer Neg Hx     ALLERGIES:  is allergic to morphine , benzalkonium chloride, escitalopram, hydralazine, neomycin-bacitracin zn-polymyx, and bacitracin-neomycin-polymyxin.  MEDICATIONS:  Current Outpatient Medications  Medication Sig Dispense Refill    ALPRAZolam  (XANAX ) 0.25 MG tablet Take 0.25 mg by mouth 2 (two) times daily as needed.      amLODipine  (NORVASC ) 5 MG tablet Take 0.5 tablets (2.5 mg total) by mouth daily. Pt states that she she increased the tablet to 5 mg daily. She was taking 2.5 mg. She stated that she thought the half tablet was not working efficiently. I asked the patient to inform her pcp that her dosage was increased.     Apoaequorin 10 MG CAPS Take 1 capsule by mouth daily.     Ascorbic Acid  (VITAMIN C ) 1000 MG tablet Take 1,000 mg by mouth daily.     aspirin  EC 81 MG tablet Take 81 mg daily by mouth.     b complex vitamins capsule Take 1 capsule by mouth daily.     Biotin  1000 MCG tablet Take 1,000 mcg by mouth daily.      Cholecalciferol  (VITAMIN D3) 2000 units capsule Take 2,000 Units by mouth daily.     Cyanocobalamin  (B-12 PO) Take 2,500 mcg by mouth daily.     linaclotide  (LINZESS ) 145 MCG CAPS capsule Take 1 capsule (145  mcg total) by mouth daily before breakfast. 30 capsule 5   predniSONE  (DELTASONE ) 5 MG tablet Take 5 mg by mouth daily.     traMADol  (ULTRAM ) 50 MG tablet Take 50 mg by mouth daily.     ZINC OXIDE PO Take 1 tablet by mouth daily.     No current facility-administered medications for this visit.    PHYSICAL EXAMINATION: ECOG PERFORMANCE STATUS: 0 - Asymptomatic  Vitals:   06/12/23 1005  BP: (!) 135/57  Pulse: 84  Temp: 98.2 F (36.8 C)  SpO2: 97%   Filed Weights   06/12/23 1005  Weight: 161 lb 3.2 oz (73.1 kg)    Physical Exam Constitutional:      Comments: Alone.  HENT:     Head: Normocephalic and atraumatic.     Mouth/Throat:     Pharynx: No oropharyngeal exudate.  Eyes:     Pupils: Pupils are equal, round, and reactive to light.  Cardiovascular:     Rate and Rhythm: Normal rate and regular rhythm.  Pulmonary:     Effort: No respiratory distress.     Breath sounds: No wheezing.  Abdominal:     General: Bowel sounds are normal. There is no distension.     Palpations:  Abdomen is soft. There is no mass.     Tenderness: There is no abdominal tenderness. There is no guarding or rebound.     Comments: Colostomy.  Parastomal hernia.  Musculoskeletal:        General: No tenderness. Normal range of motion.     Cervical back: Normal range of motion and neck supple.  Skin:    General: Skin is warm.     Comments: Hyperpigmented rash noted bilateral shins.  Right arm bruising/thin skin.  Neurological:     Mental Status: She is alert and oriented to person, place, and time.  Psychiatric:        Mood and Affect: Affect normal.     Comments: Anxious.      LABORATORY DATA:  I have reviewed the data as listed Lab Results  Component Value Date   WBC 3.3 (L) 06/12/2023   HGB 14.5 06/12/2023   HCT 45.3 06/12/2023   MCV 92.6 06/12/2023   PLT 214 06/12/2023   Recent Labs    10/10/22 0642 10/11/22 0442 10/31/22 1426 11/28/22 1009 04/11/23 0847 05/15/23 1003 06/12/23 0954  NA 138   < > 142   < > 140 142 139  K 3.8   < > 4.6   < > 3.9 4.0 4.0  CL 107   < > 102   < > 102 105 101  CO2 24   < > 26   < > 28 28 26   GLUCOSE 79   < > 165*   < > 91 92 101*  BUN 13   < > 18   < > 18 16 21   CREATININE 0.84   < > 1.03*   < > 0.87 1.03* 0.98  CALCIUM 7.9*   < > 9.3   < > 8.8* 9.1 8.7*  GFRNONAA >60   < >  --    < > >60 53* 56*  PROT 5.6*  --  6.5  --  6.8  --   --   ALBUMIN 3.2*  --  4.4  --  3.8  --   --   AST 22  --  23  --  23  --   --   ALT 11  --  15  --  16  --   --   ALKPHOS 81  --  128*  --  98  --   --   BILITOT 2.2*  --  0.9  --  1.3*  --   --    < > = values in this interval not displayed.    RADIOGRAPHIC STUDIES: I have personally reviewed the radiological images as listed and agreed with the findings in the report. No results found.   ASSESSMENT & PLAN:   Autoimmune neutropenia (HCC) # Autoimmune neutropenia- once a week- if ANC < 1.5 on- zarxio .  # Patient's WBC- today-3.3; ANC- 2.0- HOLD zarxio  today; and  continue weekly zarxio .   Patient clinically asymptomatic.  Continue cbc/zarxioweekly/pt pref. stable; continue prednisone  5 mg/day [re- fill]; continue at current dose- stable.  # Elevated BP [at home ~130]- stable.   # Hx of temporal arteritis->20 years on low dose prednisone -stable.  # Hx of B12 deficiency- July 2023- > 3400- recommend B12 PO three times week- stable.  # DISPOSITION: # HOLD ZARXIO  today # weekly cbc/zaxio x 16 # follow up with MD in 16 Weeks; - cbc/bmp/ b12 levels-zarxio -Dr.B   All questions were answered. The patient knows to call the clinic with any problems, questions or concerns.    Cindy JONELLE Joe, MD 06/12/2023 11:11 AM

## 2023-06-12 NOTE — Assessment & Plan Note (Addendum)
#   Autoimmune neutropenia- once a week- if ANC < 1.5 on- zarxio .  # Patient's WBC- today-3.3; ANC- 2.0- HOLD zarxio  today; and  continue weekly zarxio .  Patient clinically asymptomatic.  Continue cbc/zarxioweekly/pt pref. stable; continue prednisone  5 mg/day [re- fill]; continue at current dose- stable.  # Elevated BP [at home ~130]- stable.   # Hx of temporal arteritis->20 years on low dose prednisone -stable.  # Hx of B12 deficiency- July 2023- > 3400- recommend B12 PO three times week- stable.  # DISPOSITION: # HOLD ZARXIO  today # weekly cbc/zaxio x 16 # follow up with MD in 16 Weeks; - cbc/bmp/ b12 levels-zarxio -Dr.B

## 2023-06-19 ENCOUNTER — Inpatient Hospital Stay: Payer: Medicare Other

## 2023-06-19 DIAGNOSIS — D708 Other neutropenia: Secondary | ICD-10-CM | POA: Diagnosis not present

## 2023-06-19 DIAGNOSIS — D72819 Decreased white blood cell count, unspecified: Secondary | ICD-10-CM

## 2023-06-19 LAB — CBC WITH DIFFERENTIAL (CANCER CENTER ONLY)
Abs Immature Granulocytes: 0.03 10*3/uL (ref 0.00–0.07)
Basophils Absolute: 0 10*3/uL (ref 0.0–0.1)
Basophils Relative: 1 %
Eosinophils Absolute: 0 10*3/uL (ref 0.0–0.5)
Eosinophils Relative: 1 %
HCT: 45.4 % (ref 36.0–46.0)
Hemoglobin: 14.4 g/dL (ref 12.0–15.0)
Immature Granulocytes: 1 %
Lymphocytes Relative: 35 %
Lymphs Abs: 1 10*3/uL (ref 0.7–4.0)
MCH: 29.6 pg (ref 26.0–34.0)
MCHC: 31.7 g/dL (ref 30.0–36.0)
MCV: 93.2 fL (ref 80.0–100.0)
Monocytes Absolute: 0.2 10*3/uL (ref 0.1–1.0)
Monocytes Relative: 7 %
Neutro Abs: 1.5 10*3/uL — ABNORMAL LOW (ref 1.7–7.7)
Neutrophils Relative %: 55 %
Platelet Count: 220 10*3/uL (ref 150–400)
RBC: 4.87 MIL/uL (ref 3.87–5.11)
RDW: 14.7 % (ref 11.5–15.5)
WBC Count: 2.8 10*3/uL — ABNORMAL LOW (ref 4.0–10.5)
nRBC: 0 % (ref 0.0–0.2)

## 2023-06-19 NOTE — Progress Notes (Signed)
Hold Zarxio, ANC is 1.5!

## 2023-06-26 ENCOUNTER — Inpatient Hospital Stay: Payer: Medicare Other

## 2023-06-26 DIAGNOSIS — D72819 Decreased white blood cell count, unspecified: Secondary | ICD-10-CM

## 2023-06-26 DIAGNOSIS — D708 Other neutropenia: Secondary | ICD-10-CM

## 2023-06-26 LAB — CBC WITH DIFFERENTIAL (CANCER CENTER ONLY)
Abs Immature Granulocytes: 0.02 10*3/uL (ref 0.00–0.07)
Basophils Absolute: 0 10*3/uL (ref 0.0–0.1)
Basophils Relative: 1 %
Eosinophils Absolute: 0.1 10*3/uL (ref 0.0–0.5)
Eosinophils Relative: 2 %
HCT: 45.2 % (ref 36.0–46.0)
Hemoglobin: 14.2 g/dL (ref 12.0–15.0)
Immature Granulocytes: 1 %
Lymphocytes Relative: 58 %
Lymphs Abs: 1.5 10*3/uL (ref 0.7–4.0)
MCH: 29 pg (ref 26.0–34.0)
MCHC: 31.4 g/dL (ref 30.0–36.0)
MCV: 92.2 fL (ref 80.0–100.0)
Monocytes Absolute: 0.3 10*3/uL (ref 0.1–1.0)
Monocytes Relative: 11 %
Neutro Abs: 0.7 10*3/uL — ABNORMAL LOW (ref 1.7–7.7)
Neutrophils Relative %: 27 %
Platelet Count: 277 10*3/uL (ref 150–400)
RBC: 4.9 MIL/uL (ref 3.87–5.11)
RDW: 14.6 % (ref 11.5–15.5)
WBC Count: 2.6 10*3/uL — ABNORMAL LOW (ref 4.0–10.5)
nRBC: 0 % (ref 0.0–0.2)

## 2023-06-26 MED ORDER — FILGRASTIM-SNDZ 480 MCG/0.8ML IJ SOSY
480.0000 ug | PREFILLED_SYRINGE | Freq: Once | INTRAMUSCULAR | Status: AC
  Administered 2023-06-26: 480 ug via SUBCUTANEOUS
  Filled 2023-06-26: qty 0.8

## 2023-07-03 ENCOUNTER — Inpatient Hospital Stay: Payer: Medicare Other | Attending: Internal Medicine

## 2023-07-03 ENCOUNTER — Inpatient Hospital Stay: Payer: Medicare Other

## 2023-07-03 DIAGNOSIS — D72819 Decreased white blood cell count, unspecified: Secondary | ICD-10-CM

## 2023-07-03 DIAGNOSIS — D708 Other neutropenia: Secondary | ICD-10-CM | POA: Insufficient documentation

## 2023-07-03 DIAGNOSIS — Z79899 Other long term (current) drug therapy: Secondary | ICD-10-CM | POA: Diagnosis not present

## 2023-07-03 DIAGNOSIS — Z840 Family history of diseases of the skin and subcutaneous tissue: Secondary | ICD-10-CM | POA: Insufficient documentation

## 2023-07-03 DIAGNOSIS — Z800 Family history of malignant neoplasm of digestive organs: Secondary | ICD-10-CM | POA: Insufficient documentation

## 2023-07-03 DIAGNOSIS — R202 Paresthesia of skin: Secondary | ICD-10-CM | POA: Insufficient documentation

## 2023-07-03 DIAGNOSIS — Z811 Family history of alcohol abuse and dependence: Secondary | ICD-10-CM | POA: Diagnosis not present

## 2023-07-03 DIAGNOSIS — M3589 Other specified systemic involvement of connective tissue: Secondary | ICD-10-CM | POA: Diagnosis not present

## 2023-07-03 DIAGNOSIS — Z87891 Personal history of nicotine dependence: Secondary | ICD-10-CM | POA: Diagnosis not present

## 2023-07-03 LAB — CBC WITH DIFFERENTIAL (CANCER CENTER ONLY)
Abs Immature Granulocytes: 0.02 10*3/uL (ref 0.00–0.07)
Basophils Absolute: 0 10*3/uL (ref 0.0–0.1)
Basophils Relative: 1 %
Eosinophils Absolute: 0.1 10*3/uL (ref 0.0–0.5)
Eosinophils Relative: 2 %
HCT: 45.6 % (ref 36.0–46.0)
Hemoglobin: 14.3 g/dL (ref 12.0–15.0)
Immature Granulocytes: 1 %
Lymphocytes Relative: 49 %
Lymphs Abs: 1.4 10*3/uL (ref 0.7–4.0)
MCH: 29.1 pg (ref 26.0–34.0)
MCHC: 31.4 g/dL (ref 30.0–36.0)
MCV: 92.7 fL (ref 80.0–100.0)
Monocytes Absolute: 0.2 10*3/uL (ref 0.1–1.0)
Monocytes Relative: 8 %
Neutro Abs: 1.1 10*3/uL — ABNORMAL LOW (ref 1.7–7.7)
Neutrophils Relative %: 39 %
Platelet Count: 235 10*3/uL (ref 150–400)
RBC: 4.92 MIL/uL (ref 3.87–5.11)
RDW: 14.7 % (ref 11.5–15.5)
Smear Review: NORMAL
WBC Count: 2.9 10*3/uL — ABNORMAL LOW (ref 4.0–10.5)
nRBC: 0 % (ref 0.0–0.2)

## 2023-07-03 MED ORDER — FILGRASTIM-SNDZ 480 MCG/0.8ML IJ SOSY
480.0000 ug | PREFILLED_SYRINGE | Freq: Once | INTRAMUSCULAR | Status: AC
  Administered 2023-07-03: 480 ug via SUBCUTANEOUS
  Filled 2023-07-03: qty 0.8

## 2023-07-10 ENCOUNTER — Inpatient Hospital Stay: Payer: Medicare Other

## 2023-07-10 DIAGNOSIS — D72819 Decreased white blood cell count, unspecified: Secondary | ICD-10-CM

## 2023-07-10 DIAGNOSIS — D708 Other neutropenia: Secondary | ICD-10-CM | POA: Diagnosis not present

## 2023-07-10 LAB — CBC WITH DIFFERENTIAL (CANCER CENTER ONLY)
Abs Immature Granulocytes: 0.01 10*3/uL (ref 0.00–0.07)
Basophils Absolute: 0 10*3/uL (ref 0.0–0.1)
Basophils Relative: 1 %
Eosinophils Absolute: 0 10*3/uL (ref 0.0–0.5)
Eosinophils Relative: 1 %
HCT: 43.6 % (ref 36.0–46.0)
Hemoglobin: 13.6 g/dL (ref 12.0–15.0)
Immature Granulocytes: 1 %
Lymphocytes Relative: 55 %
Lymphs Abs: 0.9 10*3/uL (ref 0.7–4.0)
MCH: 29.1 pg (ref 26.0–34.0)
MCHC: 31.2 g/dL (ref 30.0–36.0)
MCV: 93.4 fL (ref 80.0–100.0)
Monocytes Absolute: 0.1 10*3/uL (ref 0.1–1.0)
Monocytes Relative: 8 %
Neutro Abs: 0.6 10*3/uL — ABNORMAL LOW (ref 1.7–7.7)
Neutrophils Relative %: 34 %
Platelet Count: 200 10*3/uL (ref 150–400)
RBC: 4.67 MIL/uL (ref 3.87–5.11)
RDW: 14.9 % (ref 11.5–15.5)
Smear Review: NORMAL
WBC Count: 1.6 10*3/uL — ABNORMAL LOW (ref 4.0–10.5)
nRBC: 0 % (ref 0.0–0.2)

## 2023-07-10 MED ORDER — FILGRASTIM-SNDZ 480 MCG/0.8ML IJ SOSY
480.0000 ug | PREFILLED_SYRINGE | Freq: Once | INTRAMUSCULAR | Status: AC
  Administered 2023-07-10: 480 ug via SUBCUTANEOUS
  Filled 2023-07-10: qty 0.8

## 2023-07-17 ENCOUNTER — Inpatient Hospital Stay: Payer: Medicare Other

## 2023-07-17 VITALS — BP 148/60

## 2023-07-17 DIAGNOSIS — D72819 Decreased white blood cell count, unspecified: Secondary | ICD-10-CM

## 2023-07-17 DIAGNOSIS — D708 Other neutropenia: Secondary | ICD-10-CM | POA: Diagnosis not present

## 2023-07-17 LAB — CBC WITH DIFFERENTIAL (CANCER CENTER ONLY)
Abs Immature Granulocytes: 0.03 10*3/uL (ref 0.00–0.07)
Basophils Absolute: 0 10*3/uL (ref 0.0–0.1)
Basophils Relative: 1 %
Eosinophils Absolute: 0.1 10*3/uL (ref 0.0–0.5)
Eosinophils Relative: 3 %
HCT: 46.4 % — ABNORMAL HIGH (ref 36.0–46.0)
Hemoglobin: 14.6 g/dL (ref 12.0–15.0)
Immature Granulocytes: 1 %
Lymphocytes Relative: 54 %
Lymphs Abs: 1.5 10*3/uL (ref 0.7–4.0)
MCH: 29 pg (ref 26.0–34.0)
MCHC: 31.5 g/dL (ref 30.0–36.0)
MCV: 92.1 fL (ref 80.0–100.0)
Monocytes Absolute: 0.4 10*3/uL (ref 0.1–1.0)
Monocytes Relative: 16 %
Neutro Abs: 0.7 10*3/uL — ABNORMAL LOW (ref 1.7–7.7)
Neutrophils Relative %: 25 %
Platelet Count: 243 10*3/uL (ref 150–400)
RBC: 5.04 MIL/uL (ref 3.87–5.11)
RDW: 14.8 % (ref 11.5–15.5)
Smear Review: ADEQUATE
WBC Count: 2.8 10*3/uL — ABNORMAL LOW (ref 4.0–10.5)
nRBC: 0 % (ref 0.0–0.2)

## 2023-07-17 MED ORDER — FILGRASTIM-SNDZ 480 MCG/0.8ML IJ SOSY
480.0000 ug | PREFILLED_SYRINGE | Freq: Once | INTRAMUSCULAR | Status: AC
  Administered 2023-07-17: 480 ug via SUBCUTANEOUS
  Filled 2023-07-17: qty 0.8

## 2023-07-24 ENCOUNTER — Inpatient Hospital Stay: Payer: Medicare Other

## 2023-07-24 VITALS — BP 162/52 | HR 75

## 2023-07-24 DIAGNOSIS — D72819 Decreased white blood cell count, unspecified: Secondary | ICD-10-CM

## 2023-07-24 DIAGNOSIS — D708 Other neutropenia: Secondary | ICD-10-CM | POA: Diagnosis not present

## 2023-07-24 LAB — CBC WITH DIFFERENTIAL (CANCER CENTER ONLY)
Abs Immature Granulocytes: 0.07 10*3/uL (ref 0.00–0.07)
Basophils Absolute: 0 10*3/uL (ref 0.0–0.1)
Basophils Relative: 1 %
Eosinophils Absolute: 0.1 10*3/uL (ref 0.0–0.5)
Eosinophils Relative: 2 %
HCT: 44.9 % (ref 36.0–46.0)
Hemoglobin: 14.4 g/dL (ref 12.0–15.0)
Immature Granulocytes: 2 %
Lymphocytes Relative: 42 %
Lymphs Abs: 1.3 10*3/uL (ref 0.7–4.0)
MCH: 29.4 pg (ref 26.0–34.0)
MCHC: 32.1 g/dL (ref 30.0–36.0)
MCV: 91.6 fL (ref 80.0–100.0)
Monocytes Absolute: 0.4 10*3/uL (ref 0.1–1.0)
Monocytes Relative: 13 %
Neutro Abs: 1.2 10*3/uL — ABNORMAL LOW (ref 1.7–7.7)
Neutrophils Relative %: 40 %
Platelet Count: 255 10*3/uL (ref 150–400)
RBC: 4.9 MIL/uL (ref 3.87–5.11)
RDW: 14.9 % (ref 11.5–15.5)
Smear Review: NORMAL
WBC Count: 3 10*3/uL — ABNORMAL LOW (ref 4.0–10.5)
nRBC: 0 % (ref 0.0–0.2)

## 2023-07-24 MED ORDER — FILGRASTIM-SNDZ 480 MCG/0.8ML IJ SOSY
480.0000 ug | PREFILLED_SYRINGE | Freq: Once | INTRAMUSCULAR | Status: AC
  Administered 2023-07-24: 480 ug via SUBCUTANEOUS
  Filled 2023-07-24: qty 0.8

## 2023-07-24 NOTE — Patient Instructions (Signed)
 Filgrastim Injection What is this medication? FILGRASTIM (fil GRA stim) lowers the risk of infection in people who are receiving chemotherapy. It works by Systems analyst make more white blood cells, which protects your body from infection. It may also be used to help people who have been exposed to high doses of radiation. It can be used to help prepare your body before a stem cell transplant. It works by helping your bone marrow make and release stem cells into the blood. This medicine may be used for other purposes; ask your health care provider or pharmacist if you have questions. COMMON BRAND NAME(S): Neupogen, Nivestym, Nypozi, Releuko, Zarxio What should I tell my care team before I take this medication? They need to know if you have any of these conditions: History of blood diseases, such as sickle cell anemia Kidney disease Recent or ongoing radiation An unusual or allergic reaction to filgrastim, pegfilgrastim, latex, rubber, other medications, foods, dyes, or preservatives Pregnant or trying to get pregnant Breast-feeding How should I use this medication? This medication is injected under the skin or into a vein. It is usually given by your care team in a hospital or clinic setting. It may be given at home. If you get this medication at home, you will be taught how to prepare and give it. Use exactly as directed. Take it as directed on the prescription label at the same time every day. Keep taking it unless your care team tells you to stop. It is important that you put your used needles and syringes in a special sharps container. Do not put them in a trash can. If you do not have a sharps container, call your pharmacist or care team to get one. This medication comes with INSTRUCTIONS FOR USE. Ask your pharmacist for directions on how to use this medication. Read the information carefully. Talk to your pharmacist or care team if you have questions. Talk to your care team about the use of  this medication in children. While it may be prescribed for children for selected conditions, precautions do apply. Overdosage: If you think you have taken too much of this medicine contact a poison control center or emergency room at once. NOTE: This medicine is only for you. Do not share this medicine with others. What if I miss a dose? It is important not to miss any doses. Talk to your care team about what to do if you miss a dose. What may interact with this medication? Medications that may cause a release of neutrophils, such as lithium This list may not describe all possible interactions. Give your health care provider a list of all the medicines, herbs, non-prescription drugs, or dietary supplements you use. Also tell them if you smoke, drink alcohol, or use illegal drugs. Some items may interact with your medicine. What should I watch for while using this medication? Your condition will be monitored carefully while you are receiving this medication. You may need bloodwork while taking this medication. Talk to your care team about your risk of cancer. You may be more at risk for certain types of cancer if you take this medication. What side effects may I notice from receiving this medication? Side effects that you should report to your care team as soon as possible: Allergic reactions--skin rash, itching, hives, swelling of the face, lips, tongue, or throat Capillary leak syndrome--stomach or muscle pain, unusual weakness or fatigue, feeling faint or lightheaded, decrease in the amount of urine, swelling of the ankles, hands,  or feet, trouble breathing High white blood cell level--fever, fatigue, trouble breathing, night sweats, change in vision, weight loss Inflammation of the aorta--fever, fatigue, back, chest, or stomach pain, severe headache Kidney injury (glomerulonephritis)--decrease in the amount of urine, red or dark brown urine, foamy or bubbly urine, swelling of the ankles, hands,  or feet Shortness of breath or trouble breathing Spleen injury--pain in upper left stomach or shoulder Unusual bruising or bleeding Side effects that usually do not require medical attention (report to your care team if they continue or are bothersome): Back pain Bone pain Fatigue Fever Headache Nausea This list may not describe all possible side effects. Call your doctor for medical advice about side effects. You may report side effects to FDA at 1-800-FDA-1088. Where should I keep my medication? Keep out of the reach of children and pets. Keep this medication in the original packaging until you are ready to take it. Protect from light. See product for storage information. Each product may have different instructions. Get rid of any unused medication after the expiration date. To get rid of medications that are no longer needed or have expired: Take the medication to a medications take-back program. Check with your pharmacy or law enforcement to find a location. If you cannot return the medication, ask your pharmacist or care team how to get rid of this medication safely. NOTE: This sheet is a summary. It may not cover all possible information. If you have questions about this medicine, talk to your doctor, pharmacist, or health care provider.  2024 Elsevier/Gold Standard (2021-10-06 00:00:00)

## 2023-07-31 ENCOUNTER — Inpatient Hospital Stay: Payer: Medicare Other

## 2023-07-31 ENCOUNTER — Inpatient Hospital Stay: Payer: Medicare Other | Attending: Internal Medicine

## 2023-07-31 DIAGNOSIS — D708 Other neutropenia: Secondary | ICD-10-CM | POA: Insufficient documentation

## 2023-07-31 DIAGNOSIS — D72819 Decreased white blood cell count, unspecified: Secondary | ICD-10-CM

## 2023-07-31 DIAGNOSIS — Z79899 Other long term (current) drug therapy: Secondary | ICD-10-CM | POA: Diagnosis not present

## 2023-07-31 LAB — CBC WITH DIFFERENTIAL (CANCER CENTER ONLY)
Abs Immature Granulocytes: 0.11 10*3/uL — ABNORMAL HIGH (ref 0.00–0.07)
Basophils Absolute: 0 10*3/uL (ref 0.0–0.1)
Basophils Relative: 1 %
Eosinophils Absolute: 0 10*3/uL (ref 0.0–0.5)
Eosinophils Relative: 1 %
HCT: 48 % — ABNORMAL HIGH (ref 36.0–46.0)
Hemoglobin: 15 g/dL (ref 12.0–15.0)
Immature Granulocytes: 3 %
Lymphocytes Relative: 31 %
Lymphs Abs: 1 10*3/uL (ref 0.7–4.0)
MCH: 28.8 pg (ref 26.0–34.0)
MCHC: 31.3 g/dL (ref 30.0–36.0)
MCV: 92.1 fL (ref 80.0–100.0)
Monocytes Absolute: 0.3 10*3/uL (ref 0.1–1.0)
Monocytes Relative: 11 %
Neutro Abs: 1.7 10*3/uL (ref 1.7–7.7)
Neutrophils Relative %: 53 %
Platelet Count: 277 10*3/uL (ref 150–400)
RBC: 5.21 MIL/uL — ABNORMAL HIGH (ref 3.87–5.11)
RDW: 14.9 % (ref 11.5–15.5)
Smear Review: NORMAL
WBC Count: 3.2 10*3/uL — ABNORMAL LOW (ref 4.0–10.5)
nRBC: 0 % (ref 0.0–0.2)

## 2023-07-31 NOTE — Progress Notes (Signed)
 Anc 1.7 today; hold Zarixo

## 2023-08-07 ENCOUNTER — Inpatient Hospital Stay: Payer: Medicare Other

## 2023-08-07 DIAGNOSIS — D72819 Decreased white blood cell count, unspecified: Secondary | ICD-10-CM

## 2023-08-07 DIAGNOSIS — D708 Other neutropenia: Secondary | ICD-10-CM | POA: Diagnosis not present

## 2023-08-07 LAB — CBC WITH DIFFERENTIAL (CANCER CENTER ONLY)
Abs Immature Granulocytes: 0.02 10*3/uL (ref 0.00–0.07)
Basophils Absolute: 0 10*3/uL (ref 0.0–0.1)
Basophils Relative: 2 %
Eosinophils Absolute: 0.1 10*3/uL (ref 0.0–0.5)
Eosinophils Relative: 2 %
HCT: 43.9 % (ref 36.0–46.0)
Hemoglobin: 13.9 g/dL (ref 12.0–15.0)
Immature Granulocytes: 1 %
Lymphocytes Relative: 48 %
Lymphs Abs: 1.3 10*3/uL (ref 0.7–4.0)
MCH: 29.2 pg (ref 26.0–34.0)
MCHC: 31.7 g/dL (ref 30.0–36.0)
MCV: 92.2 fL (ref 80.0–100.0)
Monocytes Absolute: 0.5 10*3/uL (ref 0.1–1.0)
Monocytes Relative: 17 %
Neutro Abs: 0.8 10*3/uL — ABNORMAL LOW (ref 1.7–7.7)
Neutrophils Relative %: 30 %
Platelet Count: 229 10*3/uL (ref 150–400)
RBC: 4.76 MIL/uL (ref 3.87–5.11)
RDW: 14.7 % (ref 11.5–15.5)
WBC Count: 2.6 10*3/uL — ABNORMAL LOW (ref 4.0–10.5)
nRBC: 0 % (ref 0.0–0.2)

## 2023-08-07 MED ORDER — FILGRASTIM-SNDZ 480 MCG/0.8ML IJ SOSY
480.0000 ug | PREFILLED_SYRINGE | Freq: Once | INTRAMUSCULAR | Status: AC
  Administered 2023-08-07: 480 ug via SUBCUTANEOUS
  Filled 2023-08-07: qty 0.8

## 2023-08-14 ENCOUNTER — Inpatient Hospital Stay: Payer: Medicare Other

## 2023-08-14 DIAGNOSIS — D72819 Decreased white blood cell count, unspecified: Secondary | ICD-10-CM

## 2023-08-14 DIAGNOSIS — D708 Other neutropenia: Secondary | ICD-10-CM | POA: Diagnosis not present

## 2023-08-14 LAB — CBC WITH DIFFERENTIAL (CANCER CENTER ONLY)
Abs Immature Granulocytes: 0.01 10*3/uL (ref 0.00–0.07)
Basophils Absolute: 0 10*3/uL (ref 0.0–0.1)
Basophils Relative: 1 %
Eosinophils Absolute: 0.1 10*3/uL (ref 0.0–0.5)
Eosinophils Relative: 3 %
HCT: 46.3 % — ABNORMAL HIGH (ref 36.0–46.0)
Hemoglobin: 14.4 g/dL (ref 12.0–15.0)
Immature Granulocytes: 0 %
Lymphocytes Relative: 56 %
Lymphs Abs: 1.6 10*3/uL (ref 0.7–4.0)
MCH: 28.6 pg (ref 26.0–34.0)
MCHC: 31.1 g/dL (ref 30.0–36.0)
MCV: 92 fL (ref 80.0–100.0)
Monocytes Absolute: 0.3 10*3/uL (ref 0.1–1.0)
Monocytes Relative: 12 %
Neutro Abs: 0.8 10*3/uL — ABNORMAL LOW (ref 1.7–7.7)
Neutrophils Relative %: 28 %
Platelet Count: 239 10*3/uL (ref 150–400)
RBC: 5.03 MIL/uL (ref 3.87–5.11)
RDW: 15.2 % (ref 11.5–15.5)
Smear Review: NORMAL
WBC Count: 2.9 10*3/uL — ABNORMAL LOW (ref 4.0–10.5)
nRBC: 0 % (ref 0.0–0.2)

## 2023-08-14 MED ORDER — FILGRASTIM-SNDZ 480 MCG/0.8ML IJ SOSY
480.0000 ug | PREFILLED_SYRINGE | Freq: Once | INTRAMUSCULAR | Status: AC
  Administered 2023-08-14: 480 ug via SUBCUTANEOUS
  Filled 2023-08-14: qty 0.8

## 2023-08-21 ENCOUNTER — Inpatient Hospital Stay: Payer: Medicare Other

## 2023-08-21 VITALS — BP 165/68 | HR 82

## 2023-08-21 DIAGNOSIS — D708 Other neutropenia: Secondary | ICD-10-CM | POA: Diagnosis not present

## 2023-08-21 DIAGNOSIS — D72819 Decreased white blood cell count, unspecified: Secondary | ICD-10-CM

## 2023-08-21 LAB — CBC WITH DIFFERENTIAL (CANCER CENTER ONLY)
Abs Immature Granulocytes: 0.05 10*3/uL (ref 0.00–0.07)
Basophils Absolute: 0 10*3/uL (ref 0.0–0.1)
Basophils Relative: 1 %
Eosinophils Absolute: 0.1 10*3/uL (ref 0.0–0.5)
Eosinophils Relative: 2 %
HCT: 46 % (ref 36.0–46.0)
Hemoglobin: 14.3 g/dL (ref 12.0–15.0)
Immature Granulocytes: 2 %
Lymphocytes Relative: 49 %
Lymphs Abs: 1.4 10*3/uL (ref 0.7–4.0)
MCH: 28.8 pg (ref 26.0–34.0)
MCHC: 31.1 g/dL (ref 30.0–36.0)
MCV: 92.6 fL (ref 80.0–100.0)
Monocytes Absolute: 0.3 10*3/uL (ref 0.1–1.0)
Monocytes Relative: 12 %
Neutro Abs: 1 10*3/uL — ABNORMAL LOW (ref 1.7–7.7)
Neutrophils Relative %: 34 %
Platelet Count: 220 10*3/uL (ref 150–400)
RBC: 4.97 MIL/uL (ref 3.87–5.11)
RDW: 15.6 % — ABNORMAL HIGH (ref 11.5–15.5)
Smear Review: NORMAL
WBC Count: 2.9 10*3/uL — ABNORMAL LOW (ref 4.0–10.5)
nRBC: 0 % (ref 0.0–0.2)

## 2023-08-21 MED ORDER — FILGRASTIM-SNDZ 480 MCG/0.8ML IJ SOSY
480.0000 ug | PREFILLED_SYRINGE | Freq: Once | INTRAMUSCULAR | Status: AC
  Administered 2023-08-21: 480 ug via SUBCUTANEOUS
  Filled 2023-08-21: qty 0.8

## 2023-08-28 ENCOUNTER — Inpatient Hospital Stay: Payer: Medicare Other

## 2023-08-28 ENCOUNTER — Inpatient Hospital Stay: Payer: Medicare Other | Attending: Internal Medicine

## 2023-08-28 DIAGNOSIS — Z811 Family history of alcohol abuse and dependence: Secondary | ICD-10-CM | POA: Insufficient documentation

## 2023-08-28 DIAGNOSIS — I10 Essential (primary) hypertension: Secondary | ICD-10-CM | POA: Insufficient documentation

## 2023-08-28 DIAGNOSIS — D708 Other neutropenia: Secondary | ICD-10-CM | POA: Insufficient documentation

## 2023-08-28 DIAGNOSIS — D72819 Decreased white blood cell count, unspecified: Secondary | ICD-10-CM

## 2023-08-28 DIAGNOSIS — R202 Paresthesia of skin: Secondary | ICD-10-CM | POA: Diagnosis not present

## 2023-08-28 DIAGNOSIS — Z840 Family history of diseases of the skin and subcutaneous tissue: Secondary | ICD-10-CM | POA: Diagnosis not present

## 2023-08-28 DIAGNOSIS — E538 Deficiency of other specified B group vitamins: Secondary | ICD-10-CM | POA: Insufficient documentation

## 2023-08-28 DIAGNOSIS — Z87891 Personal history of nicotine dependence: Secondary | ICD-10-CM | POA: Insufficient documentation

## 2023-08-28 DIAGNOSIS — Z800 Family history of malignant neoplasm of digestive organs: Secondary | ICD-10-CM | POA: Diagnosis not present

## 2023-08-28 DIAGNOSIS — Z79899 Other long term (current) drug therapy: Secondary | ICD-10-CM | POA: Diagnosis not present

## 2023-08-28 LAB — CBC WITH DIFFERENTIAL (CANCER CENTER ONLY)
Abs Immature Granulocytes: 0 10*3/uL (ref 0.00–0.07)
Basophils Absolute: 0.1 10*3/uL (ref 0.0–0.1)
Basophils Relative: 1 %
Eosinophils Absolute: 0.1 10*3/uL (ref 0.0–0.5)
Eosinophils Relative: 1 %
HCT: 45.9 % (ref 36.0–46.0)
Hemoglobin: 14.2 g/dL (ref 12.0–15.0)
Immature Granulocytes: 0 %
Lymphocytes Relative: 44 %
Lymphs Abs: 1.5 10*3/uL (ref 0.7–4.0)
MCH: 28.5 pg (ref 26.0–34.0)
MCHC: 30.9 g/dL (ref 30.0–36.0)
MCV: 92 fL (ref 80.0–100.0)
Monocytes Absolute: 0.4 10*3/uL (ref 0.1–1.0)
Monocytes Relative: 12 %
Neutro Abs: 1.5 10*3/uL — ABNORMAL LOW (ref 1.7–7.7)
Neutrophils Relative %: 42 %
Platelet Count: 250 10*3/uL (ref 150–400)
RBC: 4.99 MIL/uL (ref 3.87–5.11)
RDW: 15.7 % — ABNORMAL HIGH (ref 11.5–15.5)
Smear Review: NORMAL
WBC Count: 3.5 10*3/uL — ABNORMAL LOW (ref 4.0–10.5)
nRBC: 0 % (ref 0.0–0.2)

## 2023-08-28 MED ORDER — FILGRASTIM-SNDZ 480 MCG/0.8ML IJ SOSY
480.0000 ug | PREFILLED_SYRINGE | Freq: Once | INTRAMUSCULAR | Status: AC
  Administered 2023-08-28: 480 ug via SUBCUTANEOUS
  Filled 2023-08-28: qty 0.8

## 2023-09-03 ENCOUNTER — Other Ambulatory Visit: Payer: Self-pay | Admitting: *Deleted

## 2023-09-03 DIAGNOSIS — D72819 Decreased white blood cell count, unspecified: Secondary | ICD-10-CM

## 2023-09-04 ENCOUNTER — Inpatient Hospital Stay: Payer: Medicare Other

## 2023-09-04 DIAGNOSIS — D72819 Decreased white blood cell count, unspecified: Secondary | ICD-10-CM

## 2023-09-04 DIAGNOSIS — D708 Other neutropenia: Secondary | ICD-10-CM | POA: Diagnosis not present

## 2023-09-04 LAB — CBC WITH DIFFERENTIAL (CANCER CENTER ONLY)
Abs Immature Granulocytes: 0.06 10*3/uL (ref 0.00–0.07)
Basophils Absolute: 0 10*3/uL (ref 0.0–0.1)
Basophils Relative: 1 %
Eosinophils Absolute: 0 10*3/uL (ref 0.0–0.5)
Eosinophils Relative: 1 %
HCT: 46.1 % — ABNORMAL HIGH (ref 36.0–46.0)
Hemoglobin: 14.5 g/dL (ref 12.0–15.0)
Immature Granulocytes: 2 %
Lymphocytes Relative: 31 %
Lymphs Abs: 0.8 10*3/uL (ref 0.7–4.0)
MCH: 29.2 pg (ref 26.0–34.0)
MCHC: 31.5 g/dL (ref 30.0–36.0)
MCV: 92.8 fL (ref 80.0–100.0)
Monocytes Absolute: 0.2 10*3/uL (ref 0.1–1.0)
Monocytes Relative: 8 %
Neutro Abs: 1.5 10*3/uL — ABNORMAL LOW (ref 1.7–7.7)
Neutrophils Relative %: 57 %
Platelet Count: 248 10*3/uL (ref 150–400)
RBC: 4.97 MIL/uL (ref 3.87–5.11)
RDW: 15.5 % (ref 11.5–15.5)
Smear Review: NORMAL
WBC Count: 2.7 10*3/uL — ABNORMAL LOW (ref 4.0–10.5)
nRBC: 0 % (ref 0.0–0.2)

## 2023-09-04 MED ORDER — FILGRASTIM-SNDZ 480 MCG/0.8ML IJ SOSY
480.0000 ug | PREFILLED_SYRINGE | Freq: Once | INTRAMUSCULAR | Status: DC
Start: 2023-09-04 — End: 2023-09-04

## 2023-09-04 MED ORDER — FILGRASTIM-SNDZ 480 MCG/0.8ML IJ SOSY: 480.0000 ug | PREFILLED_SYRINGE | Freq: Once | INTRAMUSCULAR | Status: DC

## 2023-09-04 NOTE — Progress Notes (Signed)
 ANC was 1.5.  Per treatment plan no injection unless ANC is less than 1.5.  Patient informed to return again next week.

## 2023-09-06 ENCOUNTER — Telehealth: Payer: Self-pay | Admitting: Gastroenterology

## 2023-09-06 NOTE — Telephone Encounter (Signed)
 Marylene Land, the patient's daughter, called and left a voicemail requesting a call back from a nurse for her mother, who is a patient of Dr. Tobi Bastos. I returned the call to inform Marylene Land that we received her message and transferred her to the nurse for further assistance

## 2023-09-07 NOTE — Telephone Encounter (Addendum)
 Called patient's daughter-Angela, she stated that her mother had been complaining of GI issues like different color (orange) on her stool for the past 3-4 days. She stated that 3 days before she had BBQ that was spicy and then had 3 bowel movements after eating it. Marylene Land stated that she told her that the toilet didn't have blood and when she wiped, it didn't have blood either. The next day, Marylene Land stated that her mother was feeling better but noticed that her stool was dark so she called her to let her know. But ever since that bowel movement, her stools have been back to normal again. She then asked her mother if she had any fever, chills, nausea, vomiting or diarrhea or constipation and her mother stated that she didn't. Marylene Land stated that she believes that her mother is doing well but would like to know if she could get some labs to make sure that her blood levels are normal. 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 Marylene Land stated that if her mother calls her to tell her that she noticed some blood on her stools, that she would take her to the ED. Please advise.

## 2023-09-07 NOTE — Telephone Encounter (Signed)
 Called patient's daughter-Angela and I let her know what Dr. Tobi Bastos stated. She stated that she goes to Dr. Sharlette Dense on Monday 09/10/2023 and she will have a CBC done there. So, she stated that you could look for the results and if you see anything alarming, then to let her know.

## 2023-09-10 ENCOUNTER — Telehealth: Payer: Self-pay

## 2023-09-10 NOTE — Telephone Encounter (Signed)
 Patient's daughter- Shelvy Dickens stated that after we spoke last week, her mother told her this morning that she had not been doing well. She stated that her mother told her that her stools had been looking orange and watery and left lower abdominal pain. She stated that she believes that she has been going frequently but didn't recall how many times. Shelvy Dickens stated that she lives 4 hours away but currently in [place] to come and see her mother since her mother's cognitive is also off and has not been wearing any make-up because she is just not feeling well. 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 She wanted to know if she could have an appointment with Dr. Antony Baumgartner since her mother have been mentioning GI issues. Please advise.

## 2023-09-10 NOTE — Telephone Encounter (Signed)
 [name] stated that her mother denies having nausea, vomiting, constipation, fever, or chills.

## 2023-09-10 NOTE — Telephone Encounter (Signed)
error 

## 2023-09-10 NOTE — Telephone Encounter (Signed)
 Called Shelvy Dickens back with Dr. Lindsay Rho recommendations and she stated that she would be taking her mom to urgent care.

## 2023-09-11 ENCOUNTER — Inpatient Hospital Stay: Payer: Medicare Other

## 2023-09-11 DIAGNOSIS — D72819 Decreased white blood cell count, unspecified: Secondary | ICD-10-CM

## 2023-09-11 DIAGNOSIS — D708 Other neutropenia: Secondary | ICD-10-CM | POA: Diagnosis not present

## 2023-09-11 LAB — CBC WITH DIFFERENTIAL (CANCER CENTER ONLY)
Abs Immature Granulocytes: 0.04 10*3/uL (ref 0.00–0.07)
Basophils Absolute: 0 10*3/uL (ref 0.0–0.1)
Basophils Relative: 1 %
Eosinophils Absolute: 0.1 10*3/uL (ref 0.0–0.5)
Eosinophils Relative: 2 %
HCT: 49 % — ABNORMAL HIGH (ref 36.0–46.0)
Hemoglobin: 15.1 g/dL — ABNORMAL HIGH (ref 12.0–15.0)
Immature Granulocytes: 1 %
Lymphocytes Relative: 48 %
Lymphs Abs: 1.5 10*3/uL (ref 0.7–4.0)
MCH: 28.7 pg (ref 26.0–34.0)
MCHC: 30.8 g/dL (ref 30.0–36.0)
MCV: 93.2 fL (ref 80.0–100.0)
Monocytes Absolute: 0.4 10*3/uL (ref 0.1–1.0)
Monocytes Relative: 13 %
Neutro Abs: 1.1 10*3/uL — ABNORMAL LOW (ref 1.7–7.7)
Neutrophils Relative %: 35 %
Platelet Count: 309 10*3/uL (ref 150–400)
RBC: 5.26 MIL/uL — ABNORMAL HIGH (ref 3.87–5.11)
RDW: 15.1 % (ref 11.5–15.5)
WBC Count: 3.1 10*3/uL — ABNORMAL LOW (ref 4.0–10.5)
nRBC: 0 % (ref 0.0–0.2)

## 2023-09-11 MED ORDER — FILGRASTIM-SNDZ 480 MCG/0.8ML IJ SOSY
480.0000 ug | PREFILLED_SYRINGE | Freq: Once | INTRAMUSCULAR | Status: AC
Start: 2023-09-11 — End: 2023-09-11
  Administered 2023-09-11: 480 ug via SUBCUTANEOUS
  Filled 2023-09-11: qty 0.8

## 2023-09-18 ENCOUNTER — Inpatient Hospital Stay: Payer: Medicare Other

## 2023-09-18 DIAGNOSIS — D708 Other neutropenia: Secondary | ICD-10-CM | POA: Diagnosis not present

## 2023-09-18 DIAGNOSIS — D72819 Decreased white blood cell count, unspecified: Secondary | ICD-10-CM

## 2023-09-18 LAB — CBC WITH DIFFERENTIAL (CANCER CENTER ONLY)
Abs Immature Granulocytes: 0.16 10*3/uL — ABNORMAL HIGH (ref 0.00–0.07)
Basophils Absolute: 0 10*3/uL (ref 0.0–0.1)
Basophils Relative: 1 %
Eosinophils Absolute: 0.1 10*3/uL (ref 0.0–0.5)
Eosinophils Relative: 2 %
HCT: 45.6 % (ref 36.0–46.0)
Hemoglobin: 14.2 g/dL (ref 12.0–15.0)
Immature Granulocytes: 5 %
Lymphocytes Relative: 32 %
Lymphs Abs: 1.1 10*3/uL (ref 0.7–4.0)
MCH: 29 pg (ref 26.0–34.0)
MCHC: 31.1 g/dL (ref 30.0–36.0)
MCV: 93.3 fL (ref 80.0–100.0)
Monocytes Absolute: 0.2 10*3/uL (ref 0.1–1.0)
Monocytes Relative: 6 %
Neutro Abs: 1.8 10*3/uL (ref 1.7–7.7)
Neutrophils Relative %: 54 %
Platelet Count: 213 10*3/uL (ref 150–400)
RBC: 4.89 MIL/uL (ref 3.87–5.11)
RDW: 15.6 % — ABNORMAL HIGH (ref 11.5–15.5)
WBC Count: 3.3 10*3/uL — ABNORMAL LOW (ref 4.0–10.5)
nRBC: 0 % (ref 0.0–0.2)

## 2023-09-18 NOTE — Progress Notes (Signed)
 WBC 3.3. Diff is pending > 1.5 hr for results. Apologies to patient for the extended wait time today. ANC 1.8 no zarxio  injection today.

## 2023-09-25 ENCOUNTER — Inpatient Hospital Stay: Payer: Medicare Other

## 2023-09-25 DIAGNOSIS — D708 Other neutropenia: Secondary | ICD-10-CM | POA: Diagnosis not present

## 2023-09-25 DIAGNOSIS — D72819 Decreased white blood cell count, unspecified: Secondary | ICD-10-CM

## 2023-09-25 LAB — CBC WITH DIFFERENTIAL (CANCER CENTER ONLY)
Abs Immature Granulocytes: 0.02 10*3/uL (ref 0.00–0.07)
Basophils Absolute: 0 10*3/uL (ref 0.0–0.1)
Basophils Relative: 1 %
Eosinophils Absolute: 0.1 10*3/uL (ref 0.0–0.5)
Eosinophils Relative: 1 %
HCT: 46.8 % — ABNORMAL HIGH (ref 36.0–46.0)
Hemoglobin: 14.9 g/dL (ref 12.0–15.0)
Immature Granulocytes: 1 %
Lymphocytes Relative: 35 %
Lymphs Abs: 1.2 10*3/uL (ref 0.7–4.0)
MCH: 29.3 pg (ref 26.0–34.0)
MCHC: 31.8 g/dL (ref 30.0–36.0)
MCV: 92.1 fL (ref 80.0–100.0)
Monocytes Absolute: 0.4 10*3/uL (ref 0.1–1.0)
Monocytes Relative: 12 %
Neutro Abs: 1.8 10*3/uL (ref 1.7–7.7)
Neutrophils Relative %: 50 %
Platelet Count: 247 10*3/uL (ref 150–400)
RBC: 5.08 MIL/uL (ref 3.87–5.11)
RDW: 15 % (ref 11.5–15.5)
WBC Count: 3.6 10*3/uL — ABNORMAL LOW (ref 4.0–10.5)
nRBC: 0 % (ref 0.0–0.2)

## 2023-09-25 NOTE — Progress Notes (Signed)
 ANC 1.8 today, no need for inj

## 2023-10-02 ENCOUNTER — Inpatient Hospital Stay (HOSPITAL_BASED_OUTPATIENT_CLINIC_OR_DEPARTMENT_OTHER): Payer: Medicare Other | Admitting: Internal Medicine

## 2023-10-02 ENCOUNTER — Encounter: Payer: Self-pay | Admitting: Internal Medicine

## 2023-10-02 ENCOUNTER — Inpatient Hospital Stay: Payer: Medicare Other | Attending: Internal Medicine

## 2023-10-02 ENCOUNTER — Inpatient Hospital Stay: Payer: Medicare Other

## 2023-10-02 VITALS — BP 137/50 | HR 85 | Temp 97.8°F | Resp 20 | Wt 158.0 lb

## 2023-10-02 DIAGNOSIS — Z811 Family history of alcohol abuse and dependence: Secondary | ICD-10-CM | POA: Insufficient documentation

## 2023-10-02 DIAGNOSIS — Z840 Family history of diseases of the skin and subcutaneous tissue: Secondary | ICD-10-CM | POA: Diagnosis not present

## 2023-10-02 DIAGNOSIS — Z9049 Acquired absence of other specified parts of digestive tract: Secondary | ICD-10-CM | POA: Diagnosis not present

## 2023-10-02 DIAGNOSIS — Z79899 Other long term (current) drug therapy: Secondary | ICD-10-CM | POA: Insufficient documentation

## 2023-10-02 DIAGNOSIS — D708 Other neutropenia: Secondary | ICD-10-CM | POA: Diagnosis present

## 2023-10-02 DIAGNOSIS — Z9013 Acquired absence of bilateral breasts and nipples: Secondary | ICD-10-CM | POA: Diagnosis not present

## 2023-10-02 DIAGNOSIS — D72819 Decreased white blood cell count, unspecified: Secondary | ICD-10-CM

## 2023-10-02 DIAGNOSIS — E785 Hyperlipidemia, unspecified: Secondary | ICD-10-CM | POA: Diagnosis not present

## 2023-10-02 DIAGNOSIS — R233 Spontaneous ecchymoses: Secondary | ICD-10-CM | POA: Insufficient documentation

## 2023-10-02 DIAGNOSIS — Z800 Family history of malignant neoplasm of digestive organs: Secondary | ICD-10-CM | POA: Insufficient documentation

## 2023-10-02 DIAGNOSIS — Z87891 Personal history of nicotine dependence: Secondary | ICD-10-CM | POA: Insufficient documentation

## 2023-10-02 DIAGNOSIS — Z860100 Personal history of colon polyps, unspecified: Secondary | ICD-10-CM | POA: Diagnosis not present

## 2023-10-02 DIAGNOSIS — Z885 Allergy status to narcotic agent status: Secondary | ICD-10-CM | POA: Insufficient documentation

## 2023-10-02 DIAGNOSIS — Z853 Personal history of malignant neoplasm of breast: Secondary | ICD-10-CM | POA: Diagnosis not present

## 2023-10-02 DIAGNOSIS — Z7982 Long term (current) use of aspirin: Secondary | ICD-10-CM | POA: Diagnosis not present

## 2023-10-02 DIAGNOSIS — I10 Essential (primary) hypertension: Secondary | ICD-10-CM | POA: Diagnosis not present

## 2023-10-02 DIAGNOSIS — Z8616 Personal history of COVID-19: Secondary | ICD-10-CM | POA: Diagnosis not present

## 2023-10-02 DIAGNOSIS — Z87442 Personal history of urinary calculi: Secondary | ICD-10-CM | POA: Insufficient documentation

## 2023-10-02 DIAGNOSIS — Z85828 Personal history of other malignant neoplasm of skin: Secondary | ICD-10-CM | POA: Insufficient documentation

## 2023-10-02 LAB — BASIC METABOLIC PANEL - CANCER CENTER ONLY
Anion gap: 9 (ref 5–15)
BUN: 20 mg/dL (ref 8–23)
CO2: 29 mmol/L (ref 22–32)
Calcium: 8.8 mg/dL — ABNORMAL LOW (ref 8.9–10.3)
Chloride: 103 mmol/L (ref 98–111)
Creatinine: 1.02 mg/dL — ABNORMAL HIGH (ref 0.44–1.00)
GFR, Estimated: 53 mL/min — ABNORMAL LOW (ref >60)
Glucose, Bld: 113 mg/dL — ABNORMAL HIGH (ref 70–99)
Potassium: 3.5 mmol/L (ref 3.5–5.1)
Sodium: 141 mmol/L (ref 135–145)

## 2023-10-02 LAB — CBC WITH DIFFERENTIAL (CANCER CENTER ONLY)
Abs Immature Granulocytes: 0.03 10*3/uL (ref 0.00–0.07)
Basophils Absolute: 0 10*3/uL (ref 0.0–0.1)
Basophils Relative: 1 %
Eosinophils Absolute: 0 10*3/uL (ref 0.0–0.5)
Eosinophils Relative: 2 %
HCT: 45.8 % (ref 36.0–46.0)
Hemoglobin: 14.4 g/dL (ref 12.0–15.0)
Immature Granulocytes: 1 %
Lymphocytes Relative: 60 %
Lymphs Abs: 1.4 10*3/uL (ref 0.7–4.0)
MCH: 29 pg (ref 26.0–34.0)
MCHC: 31.4 g/dL (ref 30.0–36.0)
MCV: 92.2 fL (ref 80.0–100.0)
Monocytes Absolute: 0.2 10*3/uL (ref 0.1–1.0)
Monocytes Relative: 10 %
Neutro Abs: 0.6 10*3/uL — ABNORMAL LOW (ref 1.7–7.7)
Neutrophils Relative %: 26 %
Platelet Count: 256 10*3/uL (ref 150–400)
RBC: 4.97 MIL/uL (ref 3.87–5.11)
RDW: 15.5 % (ref 11.5–15.5)
WBC Count: 2.3 10*3/uL — ABNORMAL LOW (ref 4.0–10.5)
nRBC: 0 % (ref 0.0–0.2)

## 2023-10-02 LAB — VITAMIN B12: Vitamin B-12: 2567 pg/mL — ABNORMAL HIGH (ref 180–914)

## 2023-10-02 MED ORDER — FILGRASTIM-SNDZ 480 MCG/0.8ML IJ SOSY
480.0000 ug | PREFILLED_SYRINGE | Freq: Once | INTRAMUSCULAR | Status: AC
  Administered 2023-10-02: 480 ug via SUBCUTANEOUS
  Filled 2023-10-02: qty 0.8

## 2023-10-02 NOTE — Progress Notes (Signed)
 [facility] CONSULT NOTE  Patient Care Team: Little Riff, MD as PCP - General (Internal Medicine) Chares Commons., MD (Rheumatology) Dasher, Margette Sheldon, MD (Dermatology) Gwyn Leos, MD as Consulting Physician (Hematology and Oncology) Rogers Clayman, MD as Consulting Physician (Otolaryngology)  CHIEF COMPLAINTS/PURPOSE OF CONSULTATION: Autoimmune neutropenia  #April 2019- Autoimmune neutropenia-weekly Granix /CBC [Dr.Crocoran]; BMBx- no malignancy [Bone marrow aspirate and biopsy on 09/04/2017 revealed a hypercellular marrow for age with trilineage hematopoiesis.  There was abundant mature neutrophils.  Significant dyspoiesis or increased blasts was not identified.  Flow cytometry revealed a predominance of T lymphocytes (16% of all cells) with no abnormal phenotype.  There was a minor B-cell population (13% of lymphocytes) with slight kappa light chain excess.  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    HISTORY OF PRESENTING ILLNESS: Alone.  Ambulating independently.  ADRAINE ISAIS 89 y.o.  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  Patient has no concerns today and is feeling well. Patient has not needed any hospitalizations. Patient continues to complain of easy bruising.  No recent infections.   Review of Systems  Constitutional:  Negative for chills, diaphoresis, fever, malaise/fatigue and weight loss.  HENT:  Negative for nosebleeds and sore throat.   Eyes:  Negative for double vision.  Respiratory:   Negative for cough, hemoptysis, sputum production, shortness of breath and wheezing.   Cardiovascular:  Negative for chest pain, palpitations and orthopnea.  Gastrointestinal:  Negative for blood in stool, constipation, diarrhea, heartburn, melena, nausea and vomiting.  Genitourinary:  Negative for dysuria, frequency and urgency.  Musculoskeletal:  Negative for back pain and joint pain.  Skin:  Negative for itching.  Neurological:  Positive for tingling. Negative for dizziness, focal weakness, weakness and headaches.  Endo/Heme/Allergies:  Bruises/bleeds easily.  Psychiatric/Behavioral:  Negative for depression. The patient is nervous/anxious. The patient does not have insomnia.      MEDICAL HISTORY:  Past Medical History:  Diagnosis Date   Breast cancer [facility]) 1985   bilateral mastectomies   Depression    1 time specific situation that she did not know how to deal wiht   Diverticulosis    Hiatal hernia    History of Bell's palsy    History of colon polyps    History of COVID-19    History of nephrolithiasis    History of pancreatitis    Hyperlipidemia    Hypertension, essential 01/10/2023   IBS (irritable bowel syndrome)    Osteoporosis    osteopenia in neck   Skin cancer    Temporal arteritis (HCC)     SURGICAL HISTORY: Past Surgical History:  Procedure Laterality Date   AUGMENTATION MAMMAPLASTY Bilateral 2000   COLON RESECTION SIGMOID N/A 10/13/2017   Procedure: COLON RESECTION SIGMOID;  Surgeon: Eldred Grego, MD;  Location: [facility] ORS;  Service: General;  Laterality: N/A;   COLOSTOMY N/A 10/13/2017   Procedure: COLOSTOMY;  Surgeon: Eldred Grego, MD;  Location: [facility] ORS;  Service: General;  Laterality: N/A;   LAPAROSCOPIC CHOLECYSTECTOMY  2009   MASTECTOMY  1983   bilateral    SOCIAL HISTORY: Social  History   Socioeconomic History   Marital status: Widowed    Spouse name: Not on file   Number of children: 2   Years of education: college   Highest  education level: Not on file  Occupational History   Occupation: Public relations account executive   Occupation: Retired  Tobacco Use   Smoking status: Former    Current packs/day: 0.00    Average packs/day: 0.3 packs/day for 30.0 years (7.5 ttl pk-yrs)    Types: Cigarettes    Start date: 05/18/1975    Quit date: 05/17/2005    Years since quitting: 18.3   Smokeless tobacco: Never   Tobacco comments:    quit 11 years ago  Vaping Use   Vaping status: Never Used  Substance and Sexual Activity   Alcohol use: No    Alcohol/week: 0.0 standard drinks of alcohol   Drug use: No   Sexual activity: Not Currently  Other Topics Concern   Not on file  Social History Narrative   Patient lives at home alone.    Patient is retired.    Patient has some college.    Patient has 2 children.   Social Drivers of Corporate investment banker Strain: Not on file  Food Insecurity: No Food Insecurity (10/09/2022)   Hunger Vital Sign    Worried About Running Out of Food in the Last Year: Never true    Ran Out of Food in the Last Year: Never true  Transportation Needs: No Transportation Needs (10/09/2022)   PRAPARE - Administrator, Civil Service (Medical): No    Lack of Transportation (Non-Medical): No  Physical Activity: Not on file  Stress: Not on file  Social Connections: Not on file  Intimate Partner Violence: Not At Risk (10/09/2022)   Humiliation, Afraid, Rape, and Kick questionnaire    Fear of Current or Ex-Partner: No    Emotionally Abused: No    Physically Abused: No    Sexually Abused: No    FAMILY HISTORY: Family History  Problem Relation Age of Onset   Colon cancer Mother 10   Scleroderma Father 48   Alcoholism Brother    Stomach cancer Neg Hx     ALLERGIES:  is allergic to morphine , benzalkonium chloride, escitalopram, hydralazine, neomycin-bacitracin zn-polymyx, and bacitracin-neomycin-polymyxin.  MEDICATIONS:  Current Outpatient Medications  Medication Sig Dispense Refill    ALPRAZolam  (XANAX ) 0.25 MG tablet Take 0.25 mg by mouth 2 (two) times daily as needed.      amLODipine  (NORVASC ) 5 MG tablet Take 0.5 tablets (2.5 mg total) by mouth daily. Pt states that she she increased the tablet to 5 mg daily. She was taking 2.5 mg. She stated that she thought the half tablet was not working efficiently. I asked the patient to inform her pcp that her dosage was increased.     Apoaequorin 10 MG CAPS Take 1 capsule by mouth daily.     Ascorbic Acid (VITAMIN C) 1000 MG tablet Take 1,000 mg by mouth daily.     aspirin  EC 81 MG tablet Take 81 mg daily by mouth.     b complex vitamins capsule Take 1 capsule by mouth daily.     Biotin 1000 MCG tablet Take 1,000 mcg by mouth daily.      Cholecalciferol  (VITAMIN D3) 2000 units capsule Take 2,000 Units by mouth daily.     Cyanocobalamin  (B-12 PO) Take 2,500 mcg by mouth daily.     predniSONE  (DELTASONE ) 5 MG tablet Take 5 mg by mouth  daily.     traMADol  (ULTRAM ) 50 MG tablet Take 50 mg by mouth daily.     ZINC OXIDE PO Take 1 tablet by mouth daily.     linaclotide  (LINZESS ) 145 MCG CAPS capsule Take 1 capsule (145 mcg total) by mouth daily before breakfast. (Patient not taking: Reported on 10/02/2023) 30 capsule 5   No current facility-administered medications for this visit.    PHYSICAL EXAMINATION: ECOG PERFORMANCE STATUS: 0 - Asymptomatic  Vitals:   10/02/23 1001  BP: (!) 137/50  Pulse: 85  Resp: 20  Temp: 97.8 F (36.6 C)  SpO2: 100%   Filed Weights   10/02/23 1001  Weight: 158 lb (71.7 kg)    Physical Exam Constitutional:      Comments: Alone.  HENT:     Head: Normocephalic and atraumatic.     Mouth/Throat:     Pharynx: No oropharyngeal exudate.  Eyes:     Pupils: Pupils are equal, round, and reactive to light.  Cardiovascular:     Rate and Rhythm: Normal rate and regular rhythm.  Pulmonary:     Effort: No respiratory distress.     Breath sounds: No wheezing.  Abdominal:     General: Bowel sounds are  normal. There is no distension.     Palpations: Abdomen is soft. There is no mass.     Tenderness: There is no abdominal tenderness. There is no guarding or rebound.     Comments: Colostomy.  Parastomal hernia.  Musculoskeletal:        General: No tenderness. Normal range of motion.     Cervical back: Normal range of motion and neck supple.  Skin:    General: Skin is warm.     Comments: Hyperpigmented rash noted bilateral shins.  Right arm bruising/thin skin.  Neurological:     Mental Status: She is alert and oriented to person, place, and time.  Psychiatric:        Mood and Affect: Affect normal.     Comments: Anxious.      LABORATORY DATA:  I have reviewed the data as listed Lab Results  Component Value Date   WBC 2.3 (L) 10/02/2023   HGB 14.4 10/02/2023   HCT 45.8 10/02/2023   MCV 92.2 10/02/2023   PLT 256 10/02/2023   Recent Labs    10/10/22 0642 10/11/22 0442 10/31/22 1426 11/28/22 1009 04/11/23 0847 05/15/23 1003 06/12/23 0954 10/02/23 0951  NA 138   < > 142   < > 140 142 139 141  K 3.8   < > 4.6   < > 3.9 4.0 4.0 3.5  CL 107   < > 102   < > 102 105 101 103  CO2 24   < > 26   < > 28 28 26 29   GLUCOSE 79   < > 165*   < > 91 92 101* 113*  BUN 13   < > 18   < > 18 16 21 20   CREATININE 0.84   < > 1.03*   < > 0.87 1.03* 0.98 1.02*  CALCIUM 7.9*   < > 9.3   < > 8.8* 9.1 8.7* 8.8*  GFRNONAA >60   < >  --    < > >60 53* 56* 53*  PROT 5.6*  --  6.5  --  6.8  --   --   --   ALBUMIN 3.2*  --  4.4  --  3.8  --   --   --  AST 22  --  23  --  23  --   --   --   ALT 11  --  15  --  16  --   --   --   ALKPHOS 81  --  128*  --  98  --   --   --   BILITOT 2.2*  --  0.9  --  1.3*  --   --   --    < > = values in this interval not displayed.    RADIOGRAPHIC STUDIES: I have personally reviewed the radiological images as listed and agreed with the findings in the report. No results found.   ASSESSMENT & PLAN:   Autoimmune neutropenia (HCC) # Autoimmune neutropenia-  once a week- if ANC < 1.5 on- zarxio .  # Patient's WBC- today-2.3; ANC- 0.6 proceed with  zarxio  today; and  continue weekly zarxio .  Patient clinically asymptomatic.  Continue cbc/zarxioweekly/pt pref. stable; continue prednisone  5 mg/day [re- fill]; continue at current dose- stable.  # Elevated BP [at home ~130]- stable.   # Hx of temporal arteritis->20 years on low dose prednisone -stable.  # Hx of B12 deficiency- July 2023- > 3400- recommend B12 PO three times week- stable.  # DISPOSITION: # ZARXIO  today # weekly cbc/zaxio x 16 # follow up with MD in 16 Weeks; - cbc/bmp/ b12 levels-zarxio -Dr.B    All questions were answered. The patient knows to call the clinic with any problems, questions or concerns.    Gwyn Leos, MD 10/02/2023 10:47 AM

## 2023-10-02 NOTE — Progress Notes (Signed)
 Patient has no concerns

## 2023-10-02 NOTE — Assessment & Plan Note (Signed)
#   Autoimmune neutropenia- once a week- if ANC < 1.5 on- zarxio .  # Patient's WBC- today-2.3; ANC- 0.6 proceed with  zarxio  today; and  continue weekly zarxio .  Patient clinically asymptomatic.  Continue cbc/zarxioweekly/pt pref. stable; continue prednisone  5 mg/day [re- fill]; continue at current dose- stable.  # Elevated BP [at home ~130]- stable.   # Hx of temporal arteritis->20 years on low dose prednisone -stable.  # Hx of B12 deficiency- July 2023- > 3400- recommend B12 PO three times week- stable.  # DISPOSITION: # ZARXIO  today # weekly cbc/zaxio x 16 # follow up with MD in 16 Weeks; - cbc/bmp/ b12 levels-zarxio -Dr.B

## 2023-10-09 ENCOUNTER — Inpatient Hospital Stay

## 2023-10-09 DIAGNOSIS — D708 Other neutropenia: Secondary | ICD-10-CM | POA: Diagnosis not present

## 2023-10-09 LAB — CBC WITH DIFFERENTIAL (CANCER CENTER ONLY)
Abs Immature Granulocytes: 0.16 10*3/uL — ABNORMAL HIGH (ref 0.00–0.07)
Basophils Absolute: 0 10*3/uL (ref 0.0–0.1)
Basophils Relative: 1 %
Eosinophils Absolute: 0 10*3/uL (ref 0.0–0.5)
Eosinophils Relative: 1 %
HCT: 45 % (ref 36.0–46.0)
Hemoglobin: 14.3 g/dL (ref 12.0–15.0)
Immature Granulocytes: 4 %
Lymphocytes Relative: 22 %
Lymphs Abs: 1 10*3/uL (ref 0.7–4.0)
MCH: 29 pg (ref 26.0–34.0)
MCHC: 31.8 g/dL (ref 30.0–36.0)
MCV: 91.3 fL (ref 80.0–100.0)
Monocytes Absolute: 0.3 10*3/uL (ref 0.1–1.0)
Monocytes Relative: 6 %
Neutro Abs: 3.1 10*3/uL (ref 1.7–7.7)
Neutrophils Relative %: 66 %
Platelet Count: 231 10*3/uL (ref 150–400)
RBC: 4.93 MIL/uL (ref 3.87–5.11)
RDW: 15.7 % — ABNORMAL HIGH (ref 11.5–15.5)
Smear Review: NORMAL
WBC Count: 4.5 10*3/uL (ref 4.0–10.5)
nRBC: 0 % (ref 0.0–0.2)

## 2023-10-09 NOTE — Progress Notes (Signed)
 ANC 3.1 today, no Zarxio  needed.

## 2023-10-16 ENCOUNTER — Inpatient Hospital Stay

## 2023-10-16 ENCOUNTER — Encounter (INDEPENDENT_AMBULATORY_CARE_PROVIDER_SITE_OTHER): Payer: Self-pay

## 2023-10-16 DIAGNOSIS — D72819 Decreased white blood cell count, unspecified: Secondary | ICD-10-CM

## 2023-10-16 DIAGNOSIS — D708 Other neutropenia: Secondary | ICD-10-CM

## 2023-10-16 LAB — CBC WITH DIFFERENTIAL (CANCER CENTER ONLY)
Abs Immature Granulocytes: 0.01 10*3/uL (ref 0.00–0.07)
Basophils Absolute: 0 10*3/uL (ref 0.0–0.1)
Basophils Relative: 1 %
Eosinophils Absolute: 0 10*3/uL (ref 0.0–0.5)
Eosinophils Relative: 1 %
HCT: 42.1 % (ref 36.0–46.0)
Hemoglobin: 13.1 g/dL (ref 12.0–15.0)
Immature Granulocytes: 0 %
Lymphocytes Relative: 53 %
Lymphs Abs: 1.2 10*3/uL (ref 0.7–4.0)
MCH: 29 pg (ref 26.0–34.0)
MCHC: 31.1 g/dL (ref 30.0–36.0)
MCV: 93.1 fL (ref 80.0–100.0)
Monocytes Absolute: 0.3 10*3/uL (ref 0.1–1.0)
Monocytes Relative: 12 %
Neutro Abs: 0.8 10*3/uL — ABNORMAL LOW (ref 1.7–7.7)
Neutrophils Relative %: 33 %
Platelet Count: 198 10*3/uL (ref 150–400)
RBC: 4.52 MIL/uL (ref 3.87–5.11)
RDW: 15.9 % — ABNORMAL HIGH (ref 11.5–15.5)
WBC Count: 2.4 10*3/uL — ABNORMAL LOW (ref 4.0–10.5)
nRBC: 0 % (ref 0.0–0.2)

## 2023-10-16 MED ORDER — FILGRASTIM-SNDZ 480 MCG/0.8ML IJ SOSY
480.0000 ug | PREFILLED_SYRINGE | Freq: Once | INTRAMUSCULAR | Status: AC
  Administered 2023-10-16: 480 ug via SUBCUTANEOUS
  Filled 2023-10-16: qty 0.8

## 2023-10-23 ENCOUNTER — Emergency Department
Admission: EM | Admit: 2023-10-23 | Discharge: 2023-10-23 | Disposition: A | Discharge status: Home / Self Care | Attending: Emergency Medicine | Admitting: Emergency Medicine

## 2023-10-23 ENCOUNTER — Emergency Department

## 2023-10-23 ENCOUNTER — Inpatient Hospital Stay

## 2023-10-23 ENCOUNTER — Telehealth: Payer: Self-pay | Admitting: *Deleted

## 2023-10-23 ENCOUNTER — Other Ambulatory Visit: Payer: Self-pay

## 2023-10-23 DIAGNOSIS — K529 Noninfective gastroenteritis and colitis, unspecified: Secondary | ICD-10-CM

## 2023-10-23 DIAGNOSIS — K625 Hemorrhage of anus and rectum: Secondary | ICD-10-CM | POA: Diagnosis not present

## 2023-10-23 DIAGNOSIS — R197 Diarrhea, unspecified: Secondary | ICD-10-CM | POA: Insufficient documentation

## 2023-10-23 DIAGNOSIS — R1032 Left lower quadrant pain: Secondary | ICD-10-CM

## 2023-10-23 LAB — URINALYSIS, ROUTINE W REFLEX MICROSCOPIC
Bilirubin Urine: NEGATIVE
Glucose, UA: NEGATIVE mg/dL
Hgb urine dipstick: NEGATIVE
Ketones, ur: 5 mg/dL — AB
Leukocytes,Ua: NEGATIVE
Nitrite: NEGATIVE
Protein, ur: NEGATIVE mg/dL
Specific Gravity, Urine: 1.015 (ref 1.005–1.030)
pH: 5 (ref 5.0–8.0)

## 2023-10-23 LAB — TYPE AND SCREEN
ABO/RH(D): A POS
Antibody Screen: NEGATIVE

## 2023-10-23 LAB — CBC
HCT: 48.5 % — ABNORMAL HIGH (ref 36.0–46.0)
Hemoglobin: 15.5 g/dL — ABNORMAL HIGH (ref 12.0–15.0)
MCH: 29 pg (ref 26.0–34.0)
MCHC: 32 g/dL (ref 30.0–36.0)
MCV: 90.8 fL (ref 80.0–100.0)
Platelets: 256 10*3/uL (ref 150–400)
RBC: 5.34 MIL/uL — ABNORMAL HIGH (ref 3.87–5.11)
RDW: 15.6 % — ABNORMAL HIGH (ref 11.5–15.5)
WBC: 2.1 10*3/uL — ABNORMAL LOW (ref 4.0–10.5)
nRBC: 0 % (ref 0.0–0.2)

## 2023-10-23 LAB — COMPREHENSIVE METABOLIC PANEL WITH GFR
ALT: 18 U/L (ref 0–44)
AST: 27 U/L (ref 15–41)
Albumin: 4.2 g/dL (ref 3.5–5.0)
Alkaline Phosphatase: 85 U/L (ref 38–126)
Anion gap: 12 (ref 5–15)
BUN: 12 mg/dL (ref 8–23)
CO2: 26 mmol/L (ref 22–32)
Calcium: 9.1 mg/dL (ref 8.9–10.3)
Chloride: 104 mmol/L (ref 98–111)
Creatinine, Ser: 0.99 mg/dL (ref 0.44–1.00)
GFR, Estimated: 55 mL/min — ABNORMAL LOW (ref >60)
Glucose, Bld: 108 mg/dL — ABNORMAL HIGH (ref 70–99)
Potassium: 3.6 mmol/L (ref 3.5–5.1)
Sodium: 142 mmol/L (ref 135–145)
Total Bilirubin: 1.2 mg/dL (ref 0.0–1.2)
Total Protein: 7.4 g/dL (ref 6.5–8.1)

## 2023-10-23 LAB — LIPASE, BLOOD: Lipase: 43 U/L (ref 11–51)

## 2023-10-23 MED ORDER — IOHEXOL 300 MG/ML  SOLN
100.0000 mL | Freq: Once | INTRAMUSCULAR | Status: AC | PRN
  Administered 2023-10-23: 100 mL via INTRAVENOUS

## 2023-10-23 NOTE — ED Triage Notes (Addendum)
 Pt arrives via POV with c/o rectal bleeding that started on Friday. Pt states that the blood was enough to cover the toilet bowl and it was the only episode. Pt states that before the blood in the toilet, pt had a bright yellow BM. Pt is also experiencing lower back pain on the left side and LLQ. Pain is 3/10. Pt has a hx of a colon resection. Pt also sees the cancer center every week to check their blood counts and has chronically low WBC. Pt is A&Ox4 and ambulatory during triage.

## 2023-10-23 NOTE — Telephone Encounter (Signed)
 Please cancel lab/inj appt today.

## 2023-10-23 NOTE — ED Provider Notes (Signed)
 [facility] Provider Note    Event Date/Time   First MD Initiated Contact with Patient 10/23/23 1114     (approximate)   History   Rectal Bleeding and Back Pain   HPI [name] is a 89 y.o. female with prior history of diverticulitis and colon resection presenting today for left lower quadrant pain and rectal bleeding.  Patient states on Friday she had 1 episode when she was up walking around where she had sudden onset bleeding from her rectum.  It quickly resolved and she has not had any recurrence of the bleeding since then.  She notes some tenderness in her left lower quadrant for the past couple of days as well.  Mild diarrhea associated with it but no other constipation.  Denies history of hemorrhoids but was concerned for that versus diverticulitis.  No fevers, nausea, vomiting, chest pain, shortness of breath, dysuria.  Chart review: Patient followed chronically by the oncology team for leukopenia     Physical Exam   Triage Vital Signs: ED Triage Vitals  Encounter Vitals Group     BP 10/23/23 1109 (!) 125/58     Systolic BP Percentile --      Diastolic BP Percentile --      Pulse Rate 10/23/23 1109 85     Resp 10/23/23 1109 18     Temp 10/23/23 1109 97.7 F (36.5 C)     Temp Source 10/23/23 1109 Oral     SpO2 10/23/23 1109 100 %     Weight 10/23/23 1049 155 lb (70.3 kg)     Height 10/23/23 1049 5\' 6"  (1.676 m)     Head Circumference --      Peak Flow --      Pain Score 10/23/23 1044 3     Pain Loc --      Pain Education --      Exclude from Growth Chart --     Most recent vital signs: Vitals:   10/23/23 1109 10/23/23 1145  BP: (!) 125/58 (!) 127/93  Pulse: 85 88  Resp: 18 17  Temp: 97.7 F (36.5 C)   SpO2: 100% 100%   Physical Exam: I have reviewed the vital signs and nursing notes. General: Awake, alert, no acute distress.  Nontoxic appearing. Head:  Atraumatic, normocephalic.   ENT:  EOM intact, PERRL. Oral mucosa  is pink and moist with no lesions. Neck: Neck is supple with full range of motion, No meningeal signs. Cardiovascular:  RRR, No murmurs. Peripheral pulses palpable and equal bilaterally. Respiratory:  Symmetrical chest wall expansion.  No rhonchi, rales, or wheezes.  Good air movement throughout.  No use of accessory muscles.   Musculoskeletal:  No cyanosis or edema. Moving extremities with full ROM Abdomen:  Soft, mild tenderness to palpation left lower quadrant, nondistended. Neuro:  GCS 15, moving all four extremities, interacting appropriately. Speech clear. Psych:  Calm, appropriate.   Skin:  Warm, dry, no rash.    ED Results / Procedures / Treatments   Labs (all labs ordered are listed, but only abnormal results are displayed) Labs Reviewed  COMPREHENSIVE METABOLIC PANEL WITH GFR - Abnormal; Notable for the following components:      Result Value   Glucose, Bld 108 (*)    GFR, Estimated 55 (*)    All other components within normal limits  CBC - Abnormal; Notable for the following components:   WBC 2.1 (*)    RBC 5.34 (*)    Hemoglobin  15.5 (*)    HCT 48.5 (*)    RDW 15.6 (*)    All other components within normal limits  URINALYSIS, ROUTINE W REFLEX MICROSCOPIC - Abnormal; Notable for the following components:   Color, Urine YELLOW (*)    APPearance HAZY (*)    Ketones, ur 5 (*)    All other components within normal limits  LIPASE, BLOOD  CBG MONITORING, ED  TYPE AND SCREEN     EKG My EKG interpretation: Rate of 86, normal sinus rhythm, normal axis, normal intervals.  No acute ST elevations or depressions   RADIOLOGY CT imaging pending at time of signout but I do not see any obvious immediate pathology   PROCEDURES:  Critical Care performed: No  Procedures   MEDICATIONS ORDERED IN ED: Medications  iohexol  (OMNIPAQUE ) 300 MG/ML solution 100 mL (100 mLs Intravenous Contrast Given 10/23/23 1202)     IMPRESSION / MDM / ASSESSMENT AND PLAN / ED COURSE  I  reviewed the triage vital signs and the nursing notes.                              Differential diagnosis includes, but is not limited to, reticulitis, external hemorrhoid, internal hemorrhoid, large bowel obstruction less likely, colitis, enteritis  Patient's presentation is most consistent with acute complicated illness / injury requiring diagnostic workup.  Patient is an 89 year old female presenting today for left lower quadrant pain and 1 episode of rectal bleeding.  Rectal bleeding happened 3 days ago with no recurrence of symptoms.  Has had intermittent diarrhea but no constipation.  No other large formed stools.  Vital signs are stable but mild tenderness palpation in the left lower quadrant.  Laboratory workup with no anemia seen or thrombocytopenia.  Does have chronic leukopenia largely consistent with her baseline.  CMP otherwise reassuring.  Will get CT imaging given prior history of diverticulitis and colon resection with focal tenderness.  CT pending at time of signout.  Suspect if this is negative, then patient can likely go home.  Rectal exam shows no external hemorrhoids or any active bleeding at this time.  Laboratory workup otherwise all reassuring outside of her chronic leukopenia.     FINAL CLINICAL IMPRESSION(S) / ED DIAGNOSES   Final diagnoses:  Rectal bleed  Left lower quadrant abdominal pain     Rx / DC Orders   ED Discharge Orders     None        Note:  This document was prepared using Dragon voice recognition software and may include unintentional dictation errors.   Kandee Orion, MD 10/23/23 [phone]

## 2023-10-23 NOTE — ED Provider Notes (Signed)
 Patient received in signout from Dr. Karlynn Oyster pending CT abdomen/pelvis.  Had a singular episode of painless rectal bleeding 4 days ago.  Concern for diverticulitis due to history of this and previous colostomy and subsequent reanastomosis.  CT with signs of mild ileitis, otherwise benign.  I reassessed the patient and she reports feeling fine without any acute symptoms.  We discussed possible etiologies of the symptoms and after shared decision making plan not to pursue antibiotics but conservative measures, PCP and GI follow-up and we discussed close ED return precautions.   Arline Bennett, MD 10/23/23 [phone]

## 2023-10-23 NOTE — Telephone Encounter (Signed)
 Patient does have an appointment today for labs and a possible injection.  The patient on her message says that she is sick.  Patient says she is not coming today.  I called her to see what is going on and she did not answer the phone.

## 2023-10-23 NOTE — ED Notes (Signed)
 Patient transported to CT

## 2023-10-25 ENCOUNTER — Inpatient Hospital Stay

## 2023-10-25 ENCOUNTER — Telehealth: Payer: Self-pay | Admitting: Internal Medicine

## 2023-10-25 DIAGNOSIS — D708 Other neutropenia: Secondary | ICD-10-CM

## 2023-10-25 DIAGNOSIS — D72819 Decreased white blood cell count, unspecified: Secondary | ICD-10-CM

## 2023-10-25 MED ORDER — FILGRASTIM-SNDZ 480 MCG/0.8ML IJ SOSY
480.0000 ug | PREFILLED_SYRINGE | Freq: Once | INTRAMUSCULAR | Status: AC
  Administered 2023-10-25: 480 ug via SUBCUTANEOUS
  Filled 2023-10-25: qty 0.8

## 2023-10-25 NOTE — Telephone Encounter (Signed)
 Pt walked into clinic and stated she missed her lab/injections on Tuesday. Pt wanted to be added for today. I sent a secure chat to the CMA doing injections but as clinic is currently going on, there was no response.   I told pt that I would send a message and if she could be added for tomorrow, someone would call her.   Pt is currently scheduled for lab/injection on 6/3 and has appts scheduled all of the way through August. Please advise. Thank you

## 2023-10-30 ENCOUNTER — Inpatient Hospital Stay: Attending: Internal Medicine

## 2023-10-30 ENCOUNTER — Inpatient Hospital Stay

## 2023-10-30 DIAGNOSIS — D708 Other neutropenia: Secondary | ICD-10-CM | POA: Insufficient documentation

## 2023-10-30 DIAGNOSIS — Z79899 Other long term (current) drug therapy: Secondary | ICD-10-CM | POA: Diagnosis not present

## 2023-10-30 LAB — CBC WITH DIFFERENTIAL (CANCER CENTER ONLY)
Abs Immature Granulocytes: 0.05 10*3/uL (ref 0.00–0.07)
Basophils Absolute: 0 10*3/uL (ref 0.0–0.1)
Basophils Relative: 1 %
Eosinophils Absolute: 0.1 10*3/uL (ref 0.0–0.5)
Eosinophils Relative: 3 %
HCT: 46.4 % — ABNORMAL HIGH (ref 36.0–46.0)
Hemoglobin: 14.5 g/dL (ref 12.0–15.0)
Immature Granulocytes: 2 %
Lymphocytes Relative: 34 %
Lymphs Abs: 1 10*3/uL (ref 0.7–4.0)
MCH: 29.3 pg (ref 26.0–34.0)
MCHC: 31.3 g/dL (ref 30.0–36.0)
MCV: 93.7 fL (ref 80.0–100.0)
Monocytes Absolute: 0.3 10*3/uL (ref 0.1–1.0)
Monocytes Relative: 10 %
Neutro Abs: 1.5 10*3/uL — ABNORMAL LOW (ref 1.7–7.7)
Neutrophils Relative %: 50 %
Platelet Count: 245 10*3/uL (ref 150–400)
RBC: 4.95 MIL/uL (ref 3.87–5.11)
RDW: 15.6 % — ABNORMAL HIGH (ref 11.5–15.5)
WBC Count: 3 10*3/uL — ABNORMAL LOW (ref 4.0–10.5)
nRBC: 0 % (ref 0.0–0.2)

## 2023-10-30 NOTE — Progress Notes (Signed)
 ANC 1.5 today, no Zarxio  given today

## 2023-11-06 ENCOUNTER — Inpatient Hospital Stay

## 2023-11-06 DIAGNOSIS — D708 Other neutropenia: Secondary | ICD-10-CM | POA: Diagnosis not present

## 2023-11-06 DIAGNOSIS — D72819 Decreased white blood cell count, unspecified: Secondary | ICD-10-CM

## 2023-11-06 LAB — CBC WITH DIFFERENTIAL (CANCER CENTER ONLY)
Abs Immature Granulocytes: 0.02 10*3/uL (ref 0.00–0.07)
Basophils Absolute: 0.1 10*3/uL (ref 0.0–0.1)
Basophils Relative: 2 %
Eosinophils Absolute: 0.1 10*3/uL (ref 0.0–0.5)
Eosinophils Relative: 3 %
HCT: 45.3 % (ref 36.0–46.0)
Hemoglobin: 14.1 g/dL (ref 12.0–15.0)
Immature Granulocytes: 1 %
Lymphocytes Relative: 46 %
Lymphs Abs: 1.4 10*3/uL (ref 0.7–4.0)
MCH: 28.9 pg (ref 26.0–34.0)
MCHC: 31.1 g/dL (ref 30.0–36.0)
MCV: 92.8 fL (ref 80.0–100.0)
Monocytes Absolute: 0.3 10*3/uL (ref 0.1–1.0)
Monocytes Relative: 12 %
Neutro Abs: 1 10*3/uL — ABNORMAL LOW (ref 1.7–7.7)
Neutrophils Relative %: 36 %
Platelet Count: 259 10*3/uL (ref 150–400)
RBC: 4.88 MIL/uL (ref 3.87–5.11)
RDW: 15.6 % — ABNORMAL HIGH (ref 11.5–15.5)
WBC Count: 2.9 10*3/uL — ABNORMAL LOW (ref 4.0–10.5)
nRBC: 0 % (ref 0.0–0.2)

## 2023-11-06 MED ORDER — FILGRASTIM-SNDZ 480 MCG/0.8ML IJ SOSY
480.0000 ug | PREFILLED_SYRINGE | Freq: Once | INTRAMUSCULAR | Status: AC
  Administered 2023-11-06: 480 ug via SUBCUTANEOUS

## 2023-11-13 ENCOUNTER — Inpatient Hospital Stay

## 2023-11-13 DIAGNOSIS — D708 Other neutropenia: Secondary | ICD-10-CM | POA: Diagnosis not present

## 2023-11-13 DIAGNOSIS — D72819 Decreased white blood cell count, unspecified: Secondary | ICD-10-CM

## 2023-11-13 LAB — CBC WITH DIFFERENTIAL (CANCER CENTER ONLY)
Abs Immature Granulocytes: 0.04 10*3/uL (ref 0.00–0.07)
Basophils Absolute: 0 10*3/uL (ref 0.0–0.1)
Basophils Relative: 1 %
Eosinophils Absolute: 0.1 10*3/uL (ref 0.0–0.5)
Eosinophils Relative: 2 %
HCT: 42.9 % (ref 36.0–46.0)
Hemoglobin: 13.4 g/dL (ref 12.0–15.0)
Immature Granulocytes: 1 %
Lymphocytes Relative: 46 %
Lymphs Abs: 1.3 10*3/uL (ref 0.7–4.0)
MCH: 29.1 pg (ref 26.0–34.0)
MCHC: 31.2 g/dL (ref 30.0–36.0)
MCV: 93.3 fL (ref 80.0–100.0)
Monocytes Absolute: 0.4 10*3/uL (ref 0.1–1.0)
Monocytes Relative: 14 %
Neutro Abs: 1.1 10*3/uL — ABNORMAL LOW (ref 1.7–7.7)
Neutrophils Relative %: 36 %
Platelet Count: 222 10*3/uL (ref 150–400)
RBC: 4.6 MIL/uL (ref 3.87–5.11)
RDW: 15.9 % — ABNORMAL HIGH (ref 11.5–15.5)
Smear Review: NORMAL
WBC Count: 2.9 10*3/uL — ABNORMAL LOW (ref 4.0–10.5)
nRBC: 0 % (ref 0.0–0.2)

## 2023-11-13 MED ORDER — FILGRASTIM-SNDZ 480 MCG/0.8ML IJ SOSY
480.0000 ug | PREFILLED_SYRINGE | Freq: Once | INTRAMUSCULAR | Status: AC
  Administered 2023-11-13: 480 ug via SUBCUTANEOUS
  Filled 2023-11-13: qty 0.8

## 2023-11-20 ENCOUNTER — Inpatient Hospital Stay

## 2023-11-20 DIAGNOSIS — D708 Other neutropenia: Secondary | ICD-10-CM

## 2023-11-20 DIAGNOSIS — D72819 Decreased white blood cell count, unspecified: Secondary | ICD-10-CM

## 2023-11-20 LAB — CBC WITH DIFFERENTIAL (CANCER CENTER ONLY)
Abs Immature Granulocytes: 0.03 10*3/uL (ref 0.00–0.07)
Basophils Absolute: 0.1 10*3/uL (ref 0.0–0.1)
Basophils Relative: 2 %
Eosinophils Absolute: 0.1 10*3/uL (ref 0.0–0.5)
Eosinophils Relative: 3 %
HCT: 45 % (ref 36.0–46.0)
Hemoglobin: 14.4 g/dL (ref 12.0–15.0)
Immature Granulocytes: 1 %
Lymphocytes Relative: 60 %
Lymphs Abs: 1.4 10*3/uL (ref 0.7–4.0)
MCH: 29.3 pg (ref 26.0–34.0)
MCHC: 32 g/dL (ref 30.0–36.0)
MCV: 91.5 fL (ref 80.0–100.0)
Monocytes Absolute: 0.3 10*3/uL (ref 0.1–1.0)
Monocytes Relative: 12 %
Neutro Abs: 0.5 10*3/uL — ABNORMAL LOW (ref 1.7–7.7)
Neutrophils Relative %: 22 %
Platelet Count: 224 10*3/uL (ref 150–400)
RBC: 4.92 MIL/uL (ref 3.87–5.11)
RDW: 15.2 % (ref 11.5–15.5)
Smear Review: NORMAL
WBC Count: 2.4 10*3/uL — ABNORMAL LOW (ref 4.0–10.5)
nRBC: 0 % (ref 0.0–0.2)

## 2023-11-20 MED ORDER — FILGRASTIM-SNDZ 480 MCG/0.8ML IJ SOSY
480.0000 ug | PREFILLED_SYRINGE | Freq: Once | INTRAMUSCULAR | Status: AC
  Administered 2023-11-20: 480 ug via SUBCUTANEOUS
  Filled 2023-11-20: qty 0.8

## 2023-11-27 ENCOUNTER — Inpatient Hospital Stay

## 2023-11-27 ENCOUNTER — Inpatient Hospital Stay: Attending: Internal Medicine

## 2023-11-27 DIAGNOSIS — D708 Other neutropenia: Secondary | ICD-10-CM | POA: Insufficient documentation

## 2023-11-27 DIAGNOSIS — D72819 Decreased white blood cell count, unspecified: Secondary | ICD-10-CM

## 2023-11-27 DIAGNOSIS — Z79899 Other long term (current) drug therapy: Secondary | ICD-10-CM | POA: Diagnosis not present

## 2023-11-27 LAB — CBC WITH DIFFERENTIAL (CANCER CENTER ONLY)
Abs Immature Granulocytes: 0.07 10*3/uL (ref 0.00–0.07)
Basophils Absolute: 0 10*3/uL (ref 0.0–0.1)
Basophils Relative: 1 %
Eosinophils Absolute: 0 10*3/uL (ref 0.0–0.5)
Eosinophils Relative: 1 %
HCT: 44.1 % (ref 36.0–46.0)
Hemoglobin: 13.8 g/dL (ref 12.0–15.0)
Immature Granulocytes: 2 %
Lymphocytes Relative: 44 %
Lymphs Abs: 1.2 10*3/uL (ref 0.7–4.0)
MCH: 29.2 pg (ref 26.0–34.0)
MCHC: 31.3 g/dL (ref 30.0–36.0)
MCV: 93.2 fL (ref 80.0–100.0)
Monocytes Absolute: 0.3 10*3/uL (ref 0.1–1.0)
Monocytes Relative: 12 %
Neutro Abs: 1.2 10*3/uL — ABNORMAL LOW (ref 1.7–7.7)
Neutrophils Relative %: 40 %
Platelet Count: 217 10*3/uL (ref 150–400)
RBC: 4.73 MIL/uL (ref 3.87–5.11)
RDW: 15.7 % — ABNORMAL HIGH (ref 11.5–15.5)
Smear Review: NORMAL
WBC Count: 2.9 10*3/uL — ABNORMAL LOW (ref 4.0–10.5)
nRBC: 0 % (ref 0.0–0.2)

## 2023-11-27 MED ORDER — FILGRASTIM-SNDZ 480 MCG/0.8ML IJ SOSY
480.0000 ug | PREFILLED_SYRINGE | Freq: Once | INTRAMUSCULAR | Status: AC
  Administered 2023-11-27: 480 ug via SUBCUTANEOUS
  Filled 2023-11-27: qty 0.8

## 2023-12-04 ENCOUNTER — Inpatient Hospital Stay

## 2023-12-04 DIAGNOSIS — D708 Other neutropenia: Secondary | ICD-10-CM | POA: Diagnosis not present

## 2023-12-04 DIAGNOSIS — D72819 Decreased white blood cell count, unspecified: Secondary | ICD-10-CM

## 2023-12-04 LAB — CBC WITH DIFFERENTIAL (CANCER CENTER ONLY)
Abs Immature Granulocytes: 0.02 K/uL (ref 0.00–0.07)
Basophils Absolute: 0 K/uL (ref 0.0–0.1)
Basophils Relative: 1 %
Eosinophils Absolute: 0.1 K/uL (ref 0.0–0.5)
Eosinophils Relative: 2 %
HCT: 43.7 % (ref 36.0–46.0)
Hemoglobin: 13.8 g/dL (ref 12.0–15.0)
Immature Granulocytes: 1 %
Lymphocytes Relative: 58 %
Lymphs Abs: 1.7 K/uL (ref 0.7–4.0)
MCH: 29.1 pg (ref 26.0–34.0)
MCHC: 31.6 g/dL (ref 30.0–36.0)
MCV: 92 fL (ref 80.0–100.0)
Monocytes Absolute: 0.4 K/uL (ref 0.1–1.0)
Monocytes Relative: 15 %
Neutro Abs: 0.6 K/uL — ABNORMAL LOW (ref 1.7–7.7)
Neutrophils Relative %: 23 %
Platelet Count: 238 K/uL (ref 150–400)
RBC: 4.75 MIL/uL (ref 3.87–5.11)
RDW: 15.6 % — ABNORMAL HIGH (ref 11.5–15.5)
Smear Review: NORMAL
WBC Count: 2.8 K/uL — ABNORMAL LOW (ref 4.0–10.5)
nRBC: 0 % (ref 0.0–0.2)

## 2023-12-04 MED ORDER — FILGRASTIM-SNDZ 480 MCG/0.8ML IJ SOSY
480.0000 ug | PREFILLED_SYRINGE | Freq: Once | INTRAMUSCULAR | Status: AC
  Administered 2023-12-04: 480 ug via SUBCUTANEOUS
  Filled 2023-12-04: qty 0.8

## 2023-12-11 ENCOUNTER — Inpatient Hospital Stay

## 2023-12-11 DIAGNOSIS — D708 Other neutropenia: Secondary | ICD-10-CM

## 2023-12-11 DIAGNOSIS — D72819 Decreased white blood cell count, unspecified: Secondary | ICD-10-CM

## 2023-12-11 LAB — CBC WITH DIFFERENTIAL (CANCER CENTER ONLY)
Abs Immature Granulocytes: 0.1 K/uL — ABNORMAL HIGH (ref 0.00–0.07)
Basophils Absolute: 0 K/uL (ref 0.0–0.1)
Basophils Relative: 2 %
Eosinophils Absolute: 0 K/uL (ref 0.0–0.5)
Eosinophils Relative: 2 %
HCT: 43.9 % (ref 36.0–46.0)
Hemoglobin: 13.8 g/dL (ref 12.0–15.0)
Immature Granulocytes: 5 %
Lymphocytes Relative: 43 %
Lymphs Abs: 0.9 K/uL (ref 0.7–4.0)
MCH: 29.2 pg (ref 26.0–34.0)
MCHC: 31.4 g/dL (ref 30.0–36.0)
MCV: 93 fL (ref 80.0–100.0)
Monocytes Absolute: 0.3 K/uL (ref 0.1–1.0)
Monocytes Relative: 13 %
Neutro Abs: 0.7 K/uL — ABNORMAL LOW (ref 1.7–7.7)
Neutrophils Relative %: 35 %
Platelet Count: 237 K/uL (ref 150–400)
RBC: 4.72 MIL/uL (ref 3.87–5.11)
RDW: 15.3 % (ref 11.5–15.5)
Smear Review: ADEQUATE
WBC Count: 2.1 K/uL — ABNORMAL LOW (ref 4.0–10.5)
nRBC: 0 % (ref 0.0–0.2)

## 2023-12-11 MED ORDER — FILGRASTIM-SNDZ 480 MCG/0.8ML IJ SOSY
480.0000 ug | PREFILLED_SYRINGE | Freq: Once | INTRAMUSCULAR | Status: AC
  Administered 2023-12-11: 480 ug via SUBCUTANEOUS
  Filled 2023-12-11: qty 0.8

## 2023-12-18 ENCOUNTER — Inpatient Hospital Stay

## 2023-12-18 DIAGNOSIS — D708 Other neutropenia: Secondary | ICD-10-CM

## 2023-12-18 DIAGNOSIS — D72819 Decreased white blood cell count, unspecified: Secondary | ICD-10-CM

## 2023-12-18 LAB — CBC WITH DIFFERENTIAL (CANCER CENTER ONLY)
Abs Immature Granulocytes: 0.04 K/uL (ref 0.00–0.07)
Basophils Absolute: 0 K/uL (ref 0.0–0.1)
Basophils Relative: 1 %
Eosinophils Absolute: 0 K/uL (ref 0.0–0.5)
Eosinophils Relative: 2 %
HCT: 44.6 % (ref 36.0–46.0)
Hemoglobin: 14 g/dL (ref 12.0–15.0)
Immature Granulocytes: 2 %
Lymphocytes Relative: 46 %
Lymphs Abs: 1.2 K/uL (ref 0.7–4.0)
MCH: 29.2 pg (ref 26.0–34.0)
MCHC: 31.4 g/dL (ref 30.0–36.0)
MCV: 92.9 fL (ref 80.0–100.0)
Monocytes Absolute: 0.3 K/uL (ref 0.1–1.0)
Monocytes Relative: 13 %
Neutro Abs: 0.9 K/uL — ABNORMAL LOW (ref 1.7–7.7)
Neutrophils Relative %: 36 %
Platelet Count: 233 K/uL (ref 150–400)
RBC: 4.8 MIL/uL (ref 3.87–5.11)
RDW: 14.9 % (ref 11.5–15.5)
Smear Review: NORMAL
WBC Count: 2.6 K/uL — ABNORMAL LOW (ref 4.0–10.5)
nRBC: 0 % (ref 0.0–0.2)

## 2023-12-18 MED ORDER — FILGRASTIM-SNDZ 480 MCG/0.8ML IJ SOSY
480.0000 ug | PREFILLED_SYRINGE | Freq: Once | INTRAMUSCULAR | Status: AC
  Administered 2023-12-18: 480 ug via SUBCUTANEOUS
  Filled 2023-12-18: qty 0.8

## 2023-12-25 ENCOUNTER — Inpatient Hospital Stay

## 2023-12-25 DIAGNOSIS — D708 Other neutropenia: Secondary | ICD-10-CM | POA: Diagnosis not present

## 2023-12-25 DIAGNOSIS — D72819 Decreased white blood cell count, unspecified: Secondary | ICD-10-CM

## 2023-12-25 LAB — CBC WITH DIFFERENTIAL (CANCER CENTER ONLY)
Abs Immature Granulocytes: 0.11 K/uL — ABNORMAL HIGH (ref 0.00–0.07)
Basophils Absolute: 0 K/uL (ref 0.0–0.1)
Basophils Relative: 1 %
Eosinophils Absolute: 0 K/uL (ref 0.0–0.5)
Eosinophils Relative: 1 %
HCT: 44.3 % (ref 36.0–46.0)
Hemoglobin: 14.2 g/dL (ref 12.0–15.0)
Immature Granulocytes: 3 %
Lymphocytes Relative: 26 %
Lymphs Abs: 1.1 K/uL (ref 0.7–4.0)
MCH: 29.3 pg (ref 26.0–34.0)
MCHC: 32.1 g/dL (ref 30.0–36.0)
MCV: 91.5 fL (ref 80.0–100.0)
Monocytes Absolute: 0.6 K/uL (ref 0.1–1.0)
Monocytes Relative: 15 %
Neutro Abs: 2.2 K/uL (ref 1.7–7.7)
Neutrophils Relative %: 54 %
Platelet Count: 199 K/uL (ref 150–400)
RBC: 4.84 MIL/uL (ref 3.87–5.11)
RDW: 14.8 % (ref 11.5–15.5)
Smear Review: ADEQUATE
WBC Count: 4 K/uL (ref 4.0–10.5)
nRBC: 0 % (ref 0.0–0.2)

## 2023-12-25 NOTE — Progress Notes (Signed)
 ANC 2.2 today, no injection given

## 2024-01-01 ENCOUNTER — Inpatient Hospital Stay: Attending: Internal Medicine

## 2024-01-01 ENCOUNTER — Inpatient Hospital Stay

## 2024-01-01 DIAGNOSIS — Z79899 Other long term (current) drug therapy: Secondary | ICD-10-CM | POA: Diagnosis not present

## 2024-01-01 DIAGNOSIS — D72819 Decreased white blood cell count, unspecified: Secondary | ICD-10-CM

## 2024-01-01 DIAGNOSIS — D708 Other neutropenia: Secondary | ICD-10-CM | POA: Diagnosis present

## 2024-01-01 LAB — CBC WITH DIFFERENTIAL (CANCER CENTER ONLY)
Abs Immature Granulocytes: 0.02 K/uL (ref 0.00–0.07)
Basophils Absolute: 0 K/uL (ref 0.0–0.1)
Basophils Relative: 1 %
Eosinophils Absolute: 0.1 K/uL (ref 0.0–0.5)
Eosinophils Relative: 2 %
HCT: 43.4 % (ref 36.0–46.0)
Hemoglobin: 13.6 g/dL (ref 12.0–15.0)
Immature Granulocytes: 1 %
Lymphocytes Relative: 40 %
Lymphs Abs: 1.1 K/uL (ref 0.7–4.0)
MCH: 29.1 pg (ref 26.0–34.0)
MCHC: 31.3 g/dL (ref 30.0–36.0)
MCV: 92.7 fL (ref 80.0–100.0)
Monocytes Absolute: 0.3 K/uL (ref 0.1–1.0)
Monocytes Relative: 11 %
Neutro Abs: 1.3 K/uL — ABNORMAL LOW (ref 1.7–7.7)
Neutrophils Relative %: 45 %
Platelet Count: 327 K/uL (ref 150–400)
RBC: 4.68 MIL/uL (ref 3.87–5.11)
RDW: 14.6 % (ref 11.5–15.5)
WBC Count: 2.9 K/uL — ABNORMAL LOW (ref 4.0–10.5)
nRBC: 0 % (ref 0.0–0.2)

## 2024-01-01 MED ORDER — FILGRASTIM-SNDZ 480 MCG/0.8ML IJ SOSY
480.0000 ug | PREFILLED_SYRINGE | Freq: Once | INTRAMUSCULAR | Status: AC
  Administered 2024-01-01: 480 ug via SUBCUTANEOUS
  Filled 2024-01-01: qty 0.8

## 2024-01-08 ENCOUNTER — Inpatient Hospital Stay

## 2024-01-08 DIAGNOSIS — D708 Other neutropenia: Secondary | ICD-10-CM

## 2024-01-08 DIAGNOSIS — D72819 Decreased white blood cell count, unspecified: Secondary | ICD-10-CM

## 2024-01-08 LAB — CBC WITH DIFFERENTIAL (CANCER CENTER ONLY)
Abs Immature Granulocytes: 0.11 K/uL — ABNORMAL HIGH (ref 0.00–0.07)
Basophils Absolute: 0 K/uL (ref 0.0–0.1)
Basophils Relative: 1 %
Eosinophils Absolute: 0.1 K/uL (ref 0.0–0.5)
Eosinophils Relative: 2 %
HCT: 45.6 % (ref 36.0–46.0)
Hemoglobin: 14.5 g/dL (ref 12.0–15.0)
Immature Granulocytes: 3 %
Lymphocytes Relative: 54 %
Lymphs Abs: 1.8 K/uL (ref 0.7–4.0)
MCH: 29.3 pg (ref 26.0–34.0)
MCHC: 31.8 g/dL (ref 30.0–36.0)
MCV: 92.1 fL (ref 80.0–100.0)
Monocytes Absolute: 0.3 K/uL (ref 0.1–1.0)
Monocytes Relative: 9 %
Neutro Abs: 1 K/uL — ABNORMAL LOW (ref 1.7–7.7)
Neutrophils Relative %: 31 %
Platelet Count: 244 K/uL (ref 150–400)
RBC: 4.95 MIL/uL (ref 3.87–5.11)
RDW: 14.8 % (ref 11.5–15.5)
Smear Review: NORMAL
WBC Count: 3.3 K/uL — ABNORMAL LOW (ref 4.0–10.5)
nRBC: 0 % (ref 0.0–0.2)

## 2024-01-08 MED ORDER — FILGRASTIM-SNDZ 480 MCG/0.8ML IJ SOSY
480.0000 ug | PREFILLED_SYRINGE | Freq: Once | INTRAMUSCULAR | Status: AC
  Administered 2024-01-08 (×2): 480 ug via SUBCUTANEOUS
  Filled 2024-01-08: qty 0.8

## 2024-01-15 ENCOUNTER — Inpatient Hospital Stay

## 2024-01-15 ENCOUNTER — Observation Stay
Admission: EM | Admit: 2024-01-15 | Discharge: 2024-01-22 | Disposition: A | Discharge status: Skilled Nursing Facility | Attending: Osteopathic Medicine | Admitting: Osteopathic Medicine

## 2024-01-15 ENCOUNTER — Inpatient Hospital Stay
Admit: 2024-01-15 | Discharge: 2024-01-15 | Disposition: A | Discharge status: Home / Self Care | Attending: Internal Medicine | Admitting: Internal Medicine

## 2024-01-15 ENCOUNTER — Other Ambulatory Visit: Payer: Self-pay

## 2024-01-15 ENCOUNTER — Emergency Department

## 2024-01-15 DIAGNOSIS — F419 Anxiety disorder, unspecified: Secondary | ICD-10-CM | POA: Diagnosis not present

## 2024-01-15 DIAGNOSIS — E785 Hyperlipidemia, unspecified: Secondary | ICD-10-CM | POA: Insufficient documentation

## 2024-01-15 DIAGNOSIS — I10 Essential (primary) hypertension: Secondary | ICD-10-CM | POA: Diagnosis not present

## 2024-01-15 DIAGNOSIS — S82891A Other fracture of right lower leg, initial encounter for closed fracture: Secondary | ICD-10-CM | POA: Diagnosis not present

## 2024-01-15 DIAGNOSIS — K219 Gastro-esophageal reflux disease without esophagitis: Secondary | ICD-10-CM | POA: Diagnosis not present

## 2024-01-15 DIAGNOSIS — M316 Other giant cell arteritis: Secondary | ICD-10-CM | POA: Diagnosis present

## 2024-01-15 DIAGNOSIS — R55 Syncope and collapse: Principal | ICD-10-CM | POA: Insufficient documentation

## 2024-01-15 DIAGNOSIS — R2689 Other abnormalities of gait and mobility: Secondary | ICD-10-CM | POA: Diagnosis not present

## 2024-01-15 DIAGNOSIS — Z23 Encounter for immunization: Secondary | ICD-10-CM | POA: Insufficient documentation

## 2024-01-15 DIAGNOSIS — I739 Peripheral vascular disease, unspecified: Secondary | ICD-10-CM | POA: Diagnosis not present

## 2024-01-15 DIAGNOSIS — S8263XA Displaced fracture of lateral malleolus of unspecified fibula, initial encounter for closed fracture: Secondary | ICD-10-CM | POA: Insufficient documentation

## 2024-01-15 DIAGNOSIS — I776 Arteritis, unspecified: Secondary | ICD-10-CM | POA: Diagnosis not present

## 2024-01-15 LAB — COMPREHENSIVE METABOLIC PANEL WITH GFR
ALT: 15 U/L (ref 0–44)
AST: 27 U/L (ref 15–41)
Albumin: 4 g/dL (ref 3.5–5.0)
Alkaline Phosphatase: 87 U/L (ref 38–126)
Anion gap: 8 (ref 5–15)
BUN: 16 mg/dL (ref 8–23)
CO2: 30 mmol/L (ref 22–32)
Calcium: 9.1 mg/dL (ref 8.9–10.3)
Chloride: 104 mmol/L (ref 98–111)
Creatinine, Ser: 0.9 mg/dL (ref 0.44–1.00)
GFR, Estimated: >60 mL/min (ref >60)
Glucose, Bld: 100 mg/dL — ABNORMAL HIGH (ref 70–99)
Potassium: 4 mmol/L (ref 3.5–5.1)
Sodium: 142 mmol/L (ref 135–145)
Total Bilirubin: 1.7 mg/dL — ABNORMAL HIGH (ref 0.0–1.2)
Total Protein: 7 g/dL (ref 6.5–8.1)

## 2024-01-15 LAB — URINALYSIS, ROUTINE W REFLEX MICROSCOPIC
Bilirubin Urine: NEGATIVE
Glucose, UA: NEGATIVE mg/dL
Hgb urine dipstick: NEGATIVE
Ketones, ur: NEGATIVE mg/dL
Leukocytes,Ua: NEGATIVE
Nitrite: NEGATIVE
Protein, ur: NEGATIVE mg/dL
Specific Gravity, Urine: 1.005 (ref 1.005–1.030)
pH: 8 (ref 5.0–8.0)

## 2024-01-15 LAB — CBC
HCT: 42.6 % (ref 36.0–46.0)
Hemoglobin: 13.6 g/dL (ref 12.0–15.0)
MCH: 29.2 pg (ref 26.0–34.0)
MCHC: 31.9 g/dL (ref 30.0–36.0)
MCV: 91.6 fL (ref 80.0–100.0)
Platelets: 229 K/uL (ref 150–400)
RBC: 4.65 MIL/uL (ref 3.87–5.11)
RDW: 15.3 % (ref 11.5–15.5)
WBC: 3.2 K/uL — ABNORMAL LOW (ref 4.0–10.5)
nRBC: 0 % (ref 0.0–0.2)

## 2024-01-15 LAB — TROPONIN I (HIGH SENSITIVITY): Troponin I (High Sensitivity): 5 ng/L (ref <18)

## 2024-01-15 MED ORDER — BIOTIN 1000 MCG PO TABS: 1000.0000 ug | ORAL_TABLET | Freq: Every day | ORAL | Status: DC

## 2024-01-15 MED ORDER — ONDANSETRON HCL 4 MG PO TABS: 4.0000 mg | ORAL_TABLET | Freq: Four times a day (QID) | ORAL | Status: AC | PRN

## 2024-01-15 MED ORDER — VITAMIN D 25 MCG (1000 UNIT) PO TABS
2000.0000 [IU] | ORAL_TABLET | Freq: Every day | ORAL | Status: DC
  Administered 2024-01-15 – 2024-01-22 (×8): 2000 [IU] via ORAL
  Filled 2024-01-15 (×8): qty 2

## 2024-01-15 MED ORDER — AMLODIPINE BESYLATE 5 MG PO TABS
10.0000 mg | ORAL_TABLET | Freq: Every day | ORAL | Status: DC
  Administered 2024-01-15 – 2024-01-16 (×2): 10 mg via ORAL
  Filled 2024-01-15 (×2): qty 2

## 2024-01-15 MED ORDER — ACETAMINOPHEN 500 MG PO TABS
1000.0000 mg | ORAL_TABLET | Freq: Once | ORAL | Status: AC
  Administered 2024-01-15: 1000 mg via ORAL
  Filled 2024-01-15: qty 2

## 2024-01-15 MED ORDER — ONDANSETRON HCL 4 MG/2ML IJ SOLN
4.0000 mg | Freq: Four times a day (QID) | INTRAMUSCULAR | Status: AC | PRN
Start: 2024-01-15 — End: 2024-01-20

## 2024-01-15 MED ORDER — ACETAMINOPHEN 325 MG PO TABS
650.0000 mg | ORAL_TABLET | Freq: Four times a day (QID) | ORAL | Status: AC | PRN
  Administered 2024-01-17 – 2024-01-19 (×4): 650 mg via ORAL
  Filled 2024-01-15 (×4): qty 2

## 2024-01-15 MED ORDER — OXYCODONE HCL 5 MG PO TABS: 5.0000 mg | ORAL_TABLET | Freq: Four times a day (QID) | ORAL | Status: AC | PRN

## 2024-01-15 MED ORDER — ACETAMINOPHEN 650 MG RE SUPP: 650.0000 mg | Freq: Four times a day (QID) | RECTAL | Status: AC | PRN

## 2024-01-15 MED ORDER — OXYCODONE HCL 5 MG PO TABS: 10.0000 mg | ORAL_TABLET | Freq: Four times a day (QID) | ORAL | Status: AC | PRN

## 2024-01-15 MED ORDER — TETANUS-DIPHTH-ACELL PERTUSSIS 5-2.5-18.5 LF-MCG/0.5 IM SUSY
0.5000 mL | PREFILLED_SYRINGE | Freq: Once | INTRAMUSCULAR | Status: AC
  Administered 2024-01-15: 0.5 mL via INTRAMUSCULAR
  Filled 2024-01-15: qty 0.5

## 2024-01-15 MED ORDER — HEPARIN SODIUM (PORCINE) 5000 UNIT/ML IJ SOLN
5000.0000 [IU] | Freq: Three times a day (TID) | INTRAMUSCULAR | Status: DC
  Administered 2024-01-15 – 2024-01-17 (×5): 5000 [IU] via SUBCUTANEOUS
  Filled 2024-01-15 (×5): qty 1

## 2024-01-15 MED ORDER — ALPRAZOLAM 0.25 MG PO TABS
0.2500 mg | ORAL_TABLET | Freq: Two times a day (BID) | ORAL | Status: DC | PRN
  Administered 2024-01-16 – 2024-01-19 (×3): 0.25 mg via ORAL
  Filled 2024-01-15 (×3): qty 1

## 2024-01-15 MED ORDER — SODIUM CHLORIDE 0.9% FLUSH
3.0000 mL | Freq: Two times a day (BID) | INTRAVENOUS | Status: DC
  Administered 2024-01-15 – 2024-01-22 (×13): 3 mL via INTRAVENOUS

## 2024-01-15 MED ORDER — PREDNISONE 10 MG PO TABS
5.0000 mg | ORAL_TABLET | Freq: Every day | ORAL | Status: DC
  Administered 2024-01-15 – 2024-01-22 (×8): 5 mg via ORAL
  Filled 2024-01-15 (×8): qty 1

## 2024-01-15 MED ORDER — VITAMIN C 500 MG PO TABS
1000.0000 mg | ORAL_TABLET | Freq: Every day | ORAL | Status: DC
  Administered 2024-01-15 – 2024-01-22 (×8): 1000 mg via ORAL
  Filled 2024-01-15 (×8): qty 2

## 2024-01-15 NOTE — H&P (Addendum)
 History and Physical   [name] FMW:[id number] DOB: 1934/10/29 DOA: 01/15/2024  PCP: Rudolpho Norleen BIRCH, MD  Outpatient Specialists: Dr. Rennie, medical oncology/hematology Patient coming from: Home via POV with family member  I have personally briefly reviewed patient's old medical records in [facility] EMR.  Chief Concern: Syncope, right ankle injury and inability to weight-bear since 01/14/2024  HPI: [name] is a 89 year old female with history of htn, anxiety, leukopenia requiring transfusion (filgrastim ), who presents ED for chief concerns of syncope.  Vitals in the ED showed t of 97.6, rr 18, heart rate 71, blood pressure 150/52, SpO2 100% on room air.  Serum sodium is 142, potassium 4.0, chloride 104, bicarb 30, BUN 16, serum creatinine 0.90, EGFR greater than 60, nonfasting glucose 100, WBC 3.2, hemoglobin 13.6, platelets of 229.  HS troponin is 5. UA was negative for leukocytes and nitrates.  ED treatment: Tdap injection/Boostrix 0.5 mL one-time dose, Tylenol  1000 mg p.o. one-time dose. --------------------------------- At bedside, patient able to tell me her first and last name, age, location, current calendar year.  She reports that she does not know what happened yesterday.  While she noticed that she stood up and felt dizzy and then the next thing, she just was laying on the floor.  She reports this is never happened before.  At bedside, she denies headache, vision changes, nausea, vomiting, fever, chills, cough, dysphagia, chest pain, shortness of breath, abdominal pain, dysuria, hematuria, diarrhea, blood in her stool, constipation, swelling of her lower extremities.  She denies urinary, bowel incontinence when she woke up.  She reports her right ankle does hurt.  Social history: She lives at home on her own.  She denies tobacco, EtOH, recreational drug use.  She is retired and previously was an Catering manager.  ROS: Constitutional: no  weight change, no fever ENT/Mouth: no sore throat, no rhinorrhea Eyes: no eye pain, no vision changes Cardiovascular: no chest pain, no dyspnea,  no edema, no palpitations Respiratory: no cough, no sputum, no wheezing Gastrointestinal: no nausea, no vomiting, no diarrhea, no constipation Genitourinary: no urinary incontinence, no dysuria, no hematuria Musculoskeletal: no arthralgias, no myalgias, + right ankle pain Skin: no skin lesions, no pruritus, Neuro: + weakness, no loss of consciousness, no syncope Psych: no anxiety, no depression, no decrease appetite Heme/Lymph: no bruising, no bleeding  ED Course: Discussed with EDP, patient requiring hospitalization for chief concerns of syncope.  Assessment/Plan  Principal Problem:   Syncope Active Problems:   Temporal arteritis (HCC)   GERD   Hyperlipidemia   PAD (peripheral artery disease) (HCC)   HTN (hypertension)   Closed fracture of lateral malleolus   Anxiety   Assessment and Plan:  * Syncope Etiology workup in progress Suspect orthostatic in setting of patient standing up and experiencing syncope Complete echo ordered Fall precaution  Anxiety Home alprazolam  0.25 mg p.o. twice daily as needed for anxiety, sleep resumed PDMP reviewed  Closed fracture of lateral malleolus Mildly displaced, impacted fracture Symptomatic support: Oxycodone  5 mg p.o. every 6 hours.  For moderate pain, oxycodone  10 mg every 6 hours as needed for severe pain, 1 day ordered AM team to reevaluate patient at bedside for continued opioid pain requirements Fall precaution  HTN (hypertension) Amlodipine  10 mg daily resumed  Temporal arteritis (HCC) On chronic prednisone  5 mg daily, resume  Chart reviewed.   DVT prophylaxis: Heparin  5000 units subcutaneous every 8 hours Code Status: Full code Diet: Heart healthy diet, chopped Family Communication: Updated daughter-in-law, Reena  at bedside with patient's permission Disposition Plan:  Pending clinical course Consults called: PT, OT, TOC Admission status: Telemetry cardiac, inpatient  Past Medical History:  Diagnosis Date   Breast cancer [facility]) 1985   bilateral mastectomies   Depression    1 time specific situation that she did not know how to deal wiht   Diverticulosis    Hiatal hernia    History of Bell's palsy    History of colon polyps    History of COVID-19    History of nephrolithiasis    History of pancreatitis    Hyperlipidemia    Hypertension, essential 01/10/2023   IBS (irritable bowel syndrome)    Osteoporosis    osteopenia in neck   Skin cancer    Temporal arteritis (HCC)    Past Surgical History:  Procedure Laterality Date   AUGMENTATION MAMMAPLASTY Bilateral 2000   COLON RESECTION SIGMOID N/A 10/13/2017   Procedure: COLON RESECTION SIGMOID;  Surgeon: Rodolph Romano, MD;  Location: [facility] ORS;  Service: General;  Laterality: N/A;   COLOSTOMY N/A 10/13/2017   Procedure: COLOSTOMY;  Surgeon: Rodolph Romano, MD;  Location: [facility] ORS;  Service: General;  Laterality: N/A;   LAPAROSCOPIC CHOLECYSTECTOMY  2009   MASTECTOMY  1983   bilateral   Social History:  reports that she quit smoking about 18 years ago. Her smoking use included cigarettes. She started smoking about 48 years ago. She has a 7.5 pack-year smoking history. She has never used smokeless tobacco. She reports that she does not drink alcohol and does not use drugs.  Allergies  Allergen Reactions   Morphine  Other (See Comments)    done not work, makes me scream.   Benzalkonium Chloride Dermatitis    Other reaction(s): Unknown   Escitalopram     Deveioped a twitch   Hydralazine     Causes sx of heart pounding   Neomycin-Bacitracin Zn-Polymyx Dermatitis    Other reaction(s): Other (See Comments) REACTION: blisters skin REACTION: blisters skin REACTION: blisters skin   Bacitracin-Neomycin-Polymyxin Dermatitis    REACTION: blisters skin   Family History  Problem  Relation Age of Onset   Colon cancer Mother 17   Scleroderma Father 65   Alcoholism Brother    Stomach cancer Neg Hx    Family history: Family history reviewed and not pertinent.  Prior to Admission medications   Medication Sig Start Date End Date Taking? Authorizing Provider  ALPRAZolam  (XANAX ) 0.25 MG tablet Take 0.25 mg by mouth 2 (two) times daily as needed.  11/03/10  Yes [provider]  amLODipine  (NORVASC ) 10 MG tablet Take 10 mg by mouth daily. 11/05/23  Yes [provider]  amoxicillin -clavulanate (AUGMENTIN ) 875-125 MG tablet Take 1 tablet by mouth 2 (two) times daily. 10/26/23  Yes [provider]  Ascorbic Acid  (VITAMIN C ) 1000 MG tablet Take 1,000 mg by mouth daily.   Yes [provider]  b complex vitamins capsule Take 1 capsule by mouth daily.   Yes [provider]  Biotin  1000 MCG tablet Take 1,000 mcg by mouth daily.    Yes [provider]  Cholecalciferol  (VITAMIN D3) 2000 units capsule Take 2,000 Units by mouth daily.   Yes [provider]  Cyanocobalamin  (B-12 PO) Take 2,500 mcg by mouth daily.   Yes [provider]  predniSONE  (DELTASONE ) 5 MG tablet Take 5 mg by mouth daily.   Yes [provider]  traMADol  (ULTRAM ) 50 MG tablet Take 50 mg by mouth daily. 11/21/10  Yes [provider]  ZINC OXIDE PO Take 1 tablet by mouth daily.   Yes [provider]  Apoaequorin 10 MG CAPS Take 1 capsule by mouth daily. 01/10/23   [provider]  aspirin  EC 81 MG tablet Take 81 mg daily by mouth. Patient not taking: Reported on 01/15/2024    [provider]  linaclotide  (LINZESS ) 145 MCG CAPS capsule Take 1 capsule (145 mcg total) by mouth daily before breakfast. Patient not taking: Reported on 10/02/2023 03/20/23 09/16/23  [place], PA-C   Physical Exam: Vitals:   01/15/24 1100 01/15/24 1424 01/15/24 1428 01/15/24 1658  BP: (!) 150/52 (!) 153/55  (!) 119/41  Pulse: 71   79 80  Resp: 18 (!) 24 15 18   Temp:  98.7 F (37.1 C)  98 F (36.7 C)  TempSrc:  Oral  Oral  SpO2: 100% 99% 98% 97%  Weight:      Height:       Constitutional: appears age-appropriate, frail, NAD, calm Eyes: PERRL, lids and conjunctivae normal ENMT: Mucous membranes are moist. Posterior pharynx clear of any exudate or lesions. Age-appropriate dentition. Hearing appropriate Neck: normal, supple, no masses, no thyromegaly Respiratory: clear to auscultation bilaterally, no wheezing, no crackles. Normal respiratory effort. No accessory muscle use.  Cardiovascular: Regular rate and rhythm, no murmurs / rubs / gallops. No extremity edema. 2+ pedal pulses. No carotid bruits.  Abdomen: no tenderness, no masses palpated, no hepatosplenomegaly. Bowel sounds positive.  Musculoskeletal: no clubbing / cyanosis. No joint deformity upper and lower extremities.  Decreased ROM of the right lower extremity. no contractures, no atrophy. Normal muscle tone.  Skin: no rashes, lesions, ulcers. No induration Neurologic: Sensation intact. Strength 5/5 in all 4.  Psychiatric: Normal judgment and insight. Alert and oriented x 3. Normal mood.   EKG: independently reviewed, showing sinus rhythm with rate of 90, QTc 455  X-rays on Admission: I personally reviewed and I agree with radiologist reading as below.  Portable Chest 1 View Result Date: 01/15/2024 CLINICAL DATA:  Syncope. Loss of consciousness with resulting right ankle injury. EXAM: PORTABLE CHEST 1 VIEW COMPARISON:  Radiographs 04/11/2023 and 09/21/2017.  CT 09/21/2017. FINDINGS: 1315 hours. The heart size and mediastinal contours are stable with mild aortic atherosclerosis. There is stable mild scarring at both lung apices. The lungs are otherwise clear. No pleural effusion or pneumothorax. No acute osseous findings are evident. Bilateral breast implants are noted. IMPRESSION: No evidence of acute cardiopulmonary process. Aortic atherosclerosis.  Electronically Signed   By: Elsie Perone M.D.   On: 01/15/2024 13:36   CT Cervical Spine Wo Contrast Result Date: 01/15/2024 CLINICAL DATA:  Neck trauma.  Syncopal episode yesterday with fall. EXAM: CT CERVICAL SPINE WITHOUT CONTRAST TECHNIQUE: Multidetector CT imaging of the cervical spine was performed without intravenous contrast. Multiplanar CT image reconstructions were also generated. RADIATION DOSE REDUCTION: This exam was performed according to the departmental dose-optimization program which includes automated exposure control, adjustment of the mA and/or kV according to patient size and/or use of iterative reconstruction technique. COMPARISON:  None Available. FINDINGS: Alignment: Normal. Skull base and vertebrae: No evidence of acute fracture or traumatic subluxation. Soft tissues and spinal canal: No prevertebral fluid or swelling. No visible canal hematoma. Disc levels: Mild-to-moderate multilevel spondylosis for age with disc space narrowing, endplate osteophytes, uncinate spurring and facet hypertrophy. No large disc herniation identified. Up to moderate osseous foraminal narrowing which appears worst at C5-6 and C6-7. Upper chest: Mild biapical scarring. Aortic and carotid atherosclerosis. Other: Bilateral TMJ  degenerative changes. IMPRESSION: 1. No evidence of acute cervical spine fracture, traumatic subluxation or static signs of instability. 2. Mild-to-moderate multilevel cervical spondylosis for age. 3.  Aortic Atherosclerosis (ICD10-I70.0). Electronically Signed   By: Elsie Perone M.D.   On: 01/15/2024 11:23   CT HEAD WO CONTRAST ( ) Result Date: 01/15/2024 EXAM: CT HEAD WITHOUT CONTRAST 01/15/2024 11:09:07 AM TECHNIQUE: CT of the head was performed without the administration of intravenous contrast. Automated exposure control, iterative reconstruction, and/or weight based adjustment of the mA/kV was utilized to reduce the radiation dose to as low as reasonably achievable.  COMPARISON: CT of the head dated 09/21/2017. CLINICAL HISTORY: Head trauma, minor (Age >= 65y). Pt to ED from with family member for R ankle injury from syncopal episode yesterday at 5pm when she was getting up out of a chair. Both lower legs have bronze discoloration, but R lower leg is red and swollen. Painful to bear weight. Also daughter in law mentions some decline in cognitive function and mental status since last few months. Pt is alert and oriented. FINDINGS: BRAIN AND VENTRICLES: No acute hemorrhage. No mass. No acute cortical infarct. No hydrocephalus. Moderate cerebral white matter disease. ORBITS: Patient is status post bilateral lens surgery. The patient is status post bilateral lens replacement. SINUSES: Mild mucosal disease within the right ethmoid air cells and the sphenoid sinus. SOFT TISSUES AND SKULL: No acute soft tissue abnormality. No skull fracture. IMPRESSION: 1. No acute intracranial abnormality. 2. Moderate cerebral white matter disease. Electronically signed by: Evalene Coho MD 01/15/2024 11:21 AM EDT RP Workstation: [id number]   DG Tibia/Fibula Right Result Date: 01/15/2024 CLINICAL DATA:  fall.  Pain in the ankle and foot. EXAM: RIGHT FOOT COMPLETE - 3+ VIEW; RIGHT TIBIA AND FIBULA - 2 VIEW COMPARISON:  None Available. FINDINGS: There is mildly displaced and impacted fracture of the lateral malleolus with mild-to-moderate overlying soft tissue swelling. No other acute fracture or dislocation. No aggressive osseous lesion. Ankle mortise appears intact. Mild degenerative changes of the imaged joints. No focal soft tissue swelling. No radiopaque foreign bodies. IMPRESSION: *Mildly displaced and impacted fracture of the lateral malleolus. Electronically Signed   By: Ree Molt M.D.   On: 01/15/2024 10:44   DG Foot Complete Right Result Date: 01/15/2024 CLINICAL DATA:  fall.  Pain in the ankle and foot. EXAM: RIGHT FOOT COMPLETE - 3+ VIEW; RIGHT TIBIA AND FIBULA - 2 VIEW  COMPARISON:  None Available. FINDINGS: There is mildly displaced and impacted fracture of the lateral malleolus with mild-to-moderate overlying soft tissue swelling. No other acute fracture or dislocation. No aggressive osseous lesion. Ankle mortise appears intact. Mild degenerative changes of the imaged joints. No focal soft tissue swelling. No radiopaque foreign bodies. IMPRESSION: *Mildly displaced and impacted fracture of the lateral malleolus. Electronically Signed   By: Ree Molt M.D.   On: 01/15/2024 10:44   Labs on Admission: I have personally reviewed following labs  CBC: Recent Labs  Lab 01/15/24 1011  WBC 3.2*  HGB 13.6  HCT 42.6  MCV 91.6  PLT 229   Basic Metabolic Panel: Recent Labs  Lab 01/15/24 1011  NA 142  K 4.0  CL 104  CO2 30  GLUCOSE 100*  BUN 16  CREATININE 0.90  CALCIUM 9.1   GFR: Estimated Creatinine Clearance: 44.9 mL/min (by C-G formula based on SCr of 0.9 mg/dL).  Liver Function Tests: Recent Labs  Lab 01/15/24 1011  AST 27  ALT 15  ALKPHOS 87  BILITOT 1.7*  PROT 7.0  ALBUMIN 4.0   Urine analysis:    Component Value Date/Time   COLORURINE STRAW (A) 01/15/2024 1236   APPEARANCEUR CLEAR (A) 01/15/2024 1236   APPEARANCEUR Clear 11/04/2014 0845   LABSPEC 1.005 01/15/2024 1236   LABSPEC 1.025 08/11/2012 1358   PHURINE 8.0 01/15/2024 1236   GLUCOSEU NEGATIVE 01/15/2024 1236   GLUCOSEU Negative 08/11/2012 1358   HGBUR NEGATIVE 01/15/2024 1236   BILIRUBINUR NEGATIVE 01/15/2024 1236   BILIRUBINUR Negative 11/04/2014 0845   BILIRUBINUR Negative 08/11/2012 1358   KETONESUR NEGATIVE 01/15/2024 1236   PROTEINUR NEGATIVE 01/15/2024 1236   UROBILINOGEN 1.0 08/30/2007 2131   NITRITE NEGATIVE 01/15/2024 1236   LEUKOCYTESUR NEGATIVE 01/15/2024 1236   LEUKOCYTESUR Negative 08/11/2012 1358   This document was prepared using Dragon Voice Recognition software and may include unintentional dictation errors.  Dr. Sherre [facility]  If  7PM-7AM, please contact overnight-coverage provider If 7AM-7PM, please contact day attending provider [email or web address]  01/15/2024, 6:05 PM

## 2024-01-15 NOTE — Assessment & Plan Note (Signed)
-  Continue amlodipine 

## 2024-01-15 NOTE — Hospital Course (Addendum)
 Hospital course / significant events:   HPI: [name] is a 89 year old female with history of htn, anxiety, leukopenia requiring transfusion (filgrastim ), who presents ED for chief concerns of syncope.  She reports that she does not know what happened yesterday.  While she noticed that she stood up and felt dizzy and then the next thing, she just was laying on the floor.  She reports this is never happened before.    08/19: to ED. VSS. CT head: No acute intracranial abnormality. Moderate cerebral white matter disease. X-ray does confirm mildly displaced and impacted R lateral malleolus fracture.  08/20: Echo no concerns. PT/OT recs for SNF given pt lives alone and NWB for now w/ fracture.  08/21-08/25: pending for SNF rehab 08/26: ok for dc to [facility]        Consultants:  none  Procedures/Surgeries: none      ASSESSMENT & PLAN:   Syncope Suspect orthostatic in setting of patient standing up and experiencing syncope Complete echo ordered - no concerns Fall precaution Reduced BP meds    Anxiety Home alprazolam  0.25 mg p.o. twice daily as needed for anxiety/sleep resumed PDMP reviewed   Closed fracture of lateral malleolus Mildly displaced, impacted fracture Pain control fall precaution PT/OT  SNF rehab   Hx essential HTN (hypertension) Low diastolic pressure  Amlodipine  10 mg daily dc given orthostatic hypotension and fall/syncope --> systolic BP has been 120s-150s, diastolic has been 40s-50s   Temporal arteritis  chronic prednisone  5 mg daily  Constipation, chronic Daily miralax  Can add prn senokot tab +/- bisacodyl  suppository  Overweight based on BMI: Body mass index is 25.82 kg/m.SABRA Significantly low or high BMI is associated with higher medical risk.  Underweight - under 18  overweight - 25 to 29 obese - 30 or more Class 1 obesity: BMI of 30.0 to 34 Class 2 obesity: BMI of 35.0 to 39 Class 3 obesity: BMI of 40.0 to 49 Super Morbid  Obesity: BMI 50-59 Super-super Morbid Obesity: BMI 60+ Healthy nutrition and physical activity advised as adjunct to other disease management and risk reduction treatments    DVT prophylaxis: lovenox  IV fluids: no continuous IV fluids  Nutrition: cardiac Central lines / other devices: none  Code Status: FULL CODE ACP documentation reviewed:  none on file in [facility]  [facility] needs: SNF placement Medical barriers to dispo: none. Expected medical readiness for discharge once placement arranged.

## 2024-01-15 NOTE — Progress Notes (Signed)
*  PRELIMINARY RESULTS* Echocardiogram 2D Echocardiogram has been performed.  [name] 01/15/2024, 2:30 PM

## 2024-01-15 NOTE — Assessment & Plan Note (Signed)
 Mildly displaced, impacted fracture Symptomatic support: Oxycodone  5 mg p.o. every 6 hours.  For moderate pain, oxycodone  10 mg every 6 hours as needed for severe pain, 1 day ordered AM team to reevaluate patient at bedside for continued opioid pain requirements Fall precaution

## 2024-01-15 NOTE — ED Notes (Signed)
 Pt was assisted to the bathroom via wc to urinate. Pts clothes were removed and pt was placed into a hospital gown. Pt placed back on cardiac monitor, call light within reach, and pt dtr at bedside. Pt has no further needs at this time.

## 2024-01-15 NOTE — ED Notes (Signed)
 Due to pt heel discomfort in posterior splint Dr. Janit removed this and advised that pt should have an ace wrap and cam boot instead.  Applied both, pt expressed relief and less discomfort.

## 2024-01-15 NOTE — ED Notes (Signed)
 CCMD called at this time.

## 2024-01-15 NOTE — ED Triage Notes (Signed)
 Pt to ED from with family member for R ankle injury from syncopal episode yesterday at 5pm when she was getting up out of a chair. Both lower legs have bronze discoloration, but R lower leg is red and swollen. Painful to bear weight. Also daughter in law mentions some decline in cognitive function and mental status since last few months. Pt is alert and oriented.

## 2024-01-15 NOTE — Consult Note (Signed)
 PODIATRY CONSULTATION  NAME [name] MRN [id number] DOB May 06, 1935 DOA 01/15/2024   Reason for consult: Fracture right ankle Chief Complaint  Patient presents with   Loss of Consciousness   Ankle Injury    History of present illness: 89 y.o. female PMHx HTN, leukopenia requiring transfusion, presented to the ED for fall injury secondary to syncope.  Diagnosed with closed fracture of the right ankle.  Podiatry consulted  Past Medical History:  Diagnosis Date   Breast cancer (HCC) 1985   bilateral mastectomies   Depression    1 time specific situation that she did not know how to deal wiht   Diverticulosis    Hiatal hernia    History of Bell's palsy    History of colon polyps    History of COVID-19    History of nephrolithiasis    History of pancreatitis    Hyperlipidemia    Hypertension, essential 01/10/2023   IBS (irritable bowel syndrome)    Osteoporosis    osteopenia in neck   Skin cancer    Temporal arteritis (HCC)        Latest Ref Rng & Units 01/15/2024   10:11 AM 01/08/2024    9:44 AM 01/01/2024    9:42 AM  CBC  WBC 4.0 - 10.5 K/uL 3.2  3.3  2.9   Hemoglobin 12.0 - 15.0 g/dL 86.3  85.4  86.3   Hematocrit 36.0 - 46.0 % 42.6  45.6  43.4   Platelets 150 - 400 K/uL 229  244  327        Latest Ref Rng & Units 01/15/2024   10:11 AM 10/23/2023   10:58 AM 10/02/2023    9:51 AM  BMP  Glucose 70 - 99 mg/dL 899  891  886   BUN 8 - 23 mg/dL 16  12  20    Creatinine 0.44 - 1.00 mg/dL 9.09  9.00  8.97   Sodium 135 - 145 mmol/L 142  142  141   Potassium 3.5 - 5.1 mmol/L 4.0  3.6  3.5   Chloride 98 - 111 mmol/L 104  104  103   CO2 22 - 32 mmol/L 30  26  29    Calcium 8.9 - 10.3 mg/dL 9.1  9.1  8.8       Physical Exam: General: The patient is alert and oriented x3 in no acute distress.   Dermatology: Skin is warm, dry and supple bilateral lower extremities.  No open wounds or lacerations noted to the lower extremity  Vascular: Palpable pedal pulses  bilaterally.  Moderate edema with ecchymosis.  Capillary refill WNL  Neurological: Intact via light touch  Musculoskeletal Exam: Rectus alignment of the foot in relationship to the leg.  DG Tibia/Fibula Right 01/15/2024 FINDINGS:  There is mildly displaced and impacted fracture of the lateral  malleolus with mild-to-moderate overlying soft tissue swelling. ...   ASSESSMENT/PLAN OF CARE Syncope, unspecified syncope type  Closed fracture of right ankle, initial encounter  -Patient seen at bedside in the ED -Recommend conservative treatment management -Patient was experiencing heel pain to the posterior splint that was applied in the ED.  Posterior splint was removed and the patient felt immediate relief to the heel. -Ace wrap and immobilization cam boot applied. -Ideally patient should be nonweightbearing to the lower extremity.  If required okay for minimal weightbearing for transition purposes only -Will plan for the patient to follow-up in office 4 weeks for follow-up x-ray and reevaluation -Podiatry will sign off  Thank you for the consult.  Please contact me directly via secure chat with any questions or concerns.     Thresa EMERSON Sar, DPM [facility]  Dr. Thresa EMERSON Sar, DPM    [address] [place], [place] [address]                Office [phone]  Fax [phone]

## 2024-01-15 NOTE — Assessment & Plan Note (Signed)
 Home alprazolam  0.25 mg p.o. twice daily as needed for anxiety, sleep resumed PDMP reviewed

## 2024-01-15 NOTE — Progress Notes (Signed)
*  PRELIMINARY RESULTS* Echocardiogram 2D Echocardiogram has been performed.  Floydene Harder 01/15/2024, 2:31 PM

## 2024-01-15 NOTE — ED Notes (Signed)
 Pt voided in bedpan.  Helped pt get cleaned up.

## 2024-01-15 NOTE — ED Notes (Signed)
 This NT assisted pt to the restroom and back to room, using a wheelchair. Pt is now hooked back up to monitor and resting comfortably.

## 2024-01-15 NOTE — Assessment & Plan Note (Signed)
 On chronic prednisone  5 mg daily, resume

## 2024-01-15 NOTE — ED Provider Notes (Signed)
 [facility] Provider Note    Event Date/Time   First MD Initiated Contact with Patient 01/15/24 1022     (approximate)   History   Loss of Consciousness and Ankle Injury   HPI  [name] is a 89 y.o. female with history of colon resection, hypertension who comes in with concerns for loss of consciousness and ankle injury.  Patient is syncopal episode that occurred yesterday around 5 PM.  Patient reports that she stood up when she thinks that she blacked out and fell down onto her right leg.  She does not think she hit her head but she reports that not being able to ambulate secondary to right ankle pain.  She denies any hip pain.  She does report a little abrasion on her left elbow.  She is unclear of her last tetanus shot.  She denies any chest pain, shortness of breath.   Daughter-in-law does mention that patient has had some difficulties with remembering things and are not sure if that could be related to the event today.  Physical Exam   Triage Vital Signs: ED Triage Vitals  Encounter Vitals Group     BP 01/15/24 1009 (!) 151/56     Girls Systolic BP Percentile --      Girls Diastolic BP Percentile --      Boys Systolic BP Percentile --      Boys Diastolic BP Percentile --      Pulse Rate 01/15/24 1009 84     Resp 01/15/24 1009 18     Temp 01/15/24 1009 97.6 F (36.4 C)     Temp src --      SpO2 01/15/24 1009 100 %     Weight 01/15/24 1009 160 lb (72.6 kg)     Height 01/15/24 1009 5' 6 (1.676 m)     Head Circumference --      [facility] Flow --      Pain Score 01/15/24 1008 5     Pain Loc --      Pain Education --      Exclude from Growth Chart --     Most recent vital signs: Vitals:   01/15/24 1009  BP: (!) 151/56  Pulse: 84  Resp: 18  Temp: 97.6 F (36.4 C)  SpO2: 100%     General: Awake, no distress.  CV:  Good peripheral perfusion.  Resp:  Normal effort.  Clear lung Abd:  No distention.  Soft and  nontender Other:  Good distal pulse of the right leg but she does have swelling noted with bruising.  She has got tenderness over the ankle, foot no obvious knee tenderness or hip tenderness.  Able to straight leg raise the right leg.  No pain at the hip. Patient is alert and oriented x 4.  ED Results / Procedures / Treatments   Labs (all labs ordered are listed, but only abnormal results are displayed) Labs Reviewed  COMPREHENSIVE METABOLIC PANEL WITH GFR - Abnormal; Notable for the following components:      Result Value   Glucose, Bld 100 (*)    Total Bilirubin 1.7 (*)    All other components within normal limits  CBC - Abnormal; Notable for the following components:   WBC 3.2 (*)    All other components within normal limits  URINALYSIS, ROUTINE W REFLEX MICROSCOPIC  CBG MONITORING, ED  TROPONIN I (HIGH SENSITIVITY)  TROPONIN I (HIGH SENSITIVITY)     EKG  My interpretation  of EKG:  Normal sinus rate of 90 without any ST elevation or T wave inversions, normal intervals.  Looks similar to prior EKGs  RADIOLOGY I have reviewed the xray personally and interpreted positive malleolus fracture   PROCEDURES:  Critical Care performed: No  .1-3 Lead EKG Interpretation  Performed by: Ernest Ronal BRAVO, MD Authorized by: Ernest Ronal BRAVO, MD     Interpretation: normal     ECG rate:  70   ECG rate assessment: normal     Rhythm: sinus rhythm     Ectopy: none     Conduction: normal      MEDICATIONS ORDERED IN ED: Medications - No data to display   IMPRESSION / MDM / ASSESSMENT AND PLAN / ED COURSE  I reviewed the triage vital signs and the nursing notes.   Patient's presentation is most consistent with acute presentation with potential threat to life or bodily function.   Differential includes Electra abnormalities, AKI, arrhythmia will keep on a cardiac monitor EKG without evidence of obvious cause for arrhythmia.  X-rays evaluate for any fracture.  Although there was  some concern about some cognitive decline over the past few months patient is alert and oriented x 4 and is able to give me a very linear and clear story of her events leading up to the syncopal episode yesterday.  I did recommend they follow-up outpatient with neurology for formal evaluation.  On exam she is got a nonfocal stroke examination but I will get a CT head given the above as well as the fall to ensure no evidence of intracranial hemorrhage, cervical fracture.   IMPRESSION: 1. No acute intracranial abnormality. 2. Moderate cerebral white matter disease.  CMP is reassuring except for slightly elevated total bili.  CBC does show low white count but at some likely chronic issue for her.  Her troponin is negative and syncopal happened yesterday.  X-ray does confirm mildly displaced and impacted lateral malleolus fracture  This is not open patient has good pulse.  Discussed with podiatry Dr. Janit who stated that this is usually nonoperative and management.  Patient placed in splint stirrup.  Does recommend to be nonweightbearing okay to use to support for positiion changes.  Patient lives by herself and I did discuss with the daughter who does report over the past few weeks she has had progressively worsening memory issues where she can say things take for you to that yeah you think she is understanding stuff.  Discussed her reassuring workup and do recommend that she sees an outpatient neurologist.  However patient does live by herself and now she will be nonweightbearing on this right leg so I did discuss with them admission for pain control, physical therapy, Occupational Therapy and decide if placement would be best for patient versus returning home.  She did have a skin tear on her left elbow that we discussed repair of but it has been over 18 hours since injury and after discussion with family we have opted to not repair it and just wash it out and wrap it given no active bleeding.  She has  got no pain or difficulties moving the elbow to suggest fracture here.  Given the syncopal episode with ankle fracture I will discuss with the hospital team for admission    The patient is on the cardiac monitor to evaluate for evidence of arrhythmia and/or significant heart rate changes.      FINAL CLINICAL IMPRESSION(S) / ED DIAGNOSES   Final diagnoses:  Syncope, unspecified syncope type  Closed fracture of right ankle, initial encounter     Rx / DC Orders   ED Discharge Orders     None        Note:  This document was prepared using Dragon voice recognition software and may include unintentional dictation errors.   Ernest Ronal BRAVO, MD 01/15/24 [phone]

## 2024-01-15 NOTE — Assessment & Plan Note (Signed)
 Etiology workup in progress Suspect orthostatic in setting of patient standing up and experiencing syncope Complete echo ordered Fall precaution

## 2024-01-16 DIAGNOSIS — R55 Syncope and collapse: Secondary | ICD-10-CM | POA: Diagnosis not present

## 2024-01-16 LAB — ECHOCARDIOGRAM COMPLETE
AR max vel: 2.84 cm2
AV Area VTI: 3.3 cm2
AV Area mean vel: 2.77 cm2
AV Mean grad: 4.5 mmHg
AV Peak grad: 7.2 mmHg
Ao pk vel: 1.34 m/s
Area-P 1/2: 1.97 cm2
Height: 66 in
MV VTI: 1.83 cm2
S' Lateral: 2.3 cm
Weight: 2560 [oz_av]

## 2024-01-16 LAB — BASIC METABOLIC PANEL WITH GFR
Anion gap: 11 (ref 5–15)
BUN: 13 mg/dL (ref 8–23)
CO2: 24 mmol/L (ref 22–32)
Calcium: 8.9 mg/dL (ref 8.9–10.3)
Chloride: 107 mmol/L (ref 98–111)
Creatinine, Ser: 0.92 mg/dL (ref 0.44–1.00)
GFR, Estimated: >60 mL/min (ref >60)
Glucose, Bld: 103 mg/dL — ABNORMAL HIGH (ref 70–99)
Potassium: 3.7 mmol/L (ref 3.5–5.1)
Sodium: 142 mmol/L (ref 135–145)

## 2024-01-16 LAB — CBC
HCT: 41.9 % (ref 36.0–46.0)
Hemoglobin: 13.4 g/dL (ref 12.0–15.0)
MCH: 29.1 pg (ref 26.0–34.0)
MCHC: 32 g/dL (ref 30.0–36.0)
MCV: 91.1 fL (ref 80.0–100.0)
Platelets: 220 K/uL (ref 150–400)
RBC: 4.6 MIL/uL (ref 3.87–5.11)
RDW: 15.2 % (ref 11.5–15.5)
WBC: 3 K/uL — ABNORMAL LOW (ref 4.0–10.5)
nRBC: 0 % (ref 0.0–0.2)

## 2024-01-16 LAB — CBG MONITORING, ED: Glucose-Capillary: 105 mg/dL — ABNORMAL HIGH (ref 70–99)

## 2024-01-16 MED ORDER — OXYCODONE HCL 5 MG PO TABS
10.0000 mg | ORAL_TABLET | Freq: Four times a day (QID) | ORAL | Status: DC | PRN
  Administered 2024-01-17: 10 mg via ORAL
  Filled 2024-01-16: qty 2

## 2024-01-16 MED ORDER — OXYCODONE HCL 5 MG PO TABS
5.0000 mg | ORAL_TABLET | Freq: Four times a day (QID) | ORAL | Status: DC | PRN
  Administered 2024-01-16 – 2024-01-18 (×4): 5 mg via ORAL
  Filled 2024-01-16 (×5): qty 1

## 2024-01-16 NOTE — Evaluation (Signed)
 Physical Therapy Evaluation Patient Details Name: [name] MRN: [id number] DOB: 12/18/1934 Today's Date: 01/16/2024  History of Present Illness  [name] is a 89 year old female with history of htn, anxiety, leukopenia requiring transfusion (filgrastim ), who presents ED for chief concerns of syncope, mechanical fall, and R ankle pain. Imaging shows R Mildly displaced and impacted fracture of the lateral malleolus.  Clinical Impression  Patient resting in bed upon arrival to room; generally fatigued, but agreeable to participation with session.  Oriented to self, location and general situation; notable impairment in overall STM, insight/awareness throughout session.  Endorses minimal/no pain to R LE, unchanged with position or WBing; immobilized in CAM boot throughout session.  Bilat UE/LE strength and ROM (except R ankle) grossly symmetrical and WFL; no focal weakness, sensory or coordination deficits outside of R ankle. Currently requiring supervision for bed mobility; min assist +1 for sit/stand, static standing and short-distance gait (4') with RW.  Demonstrates 4 hops forward/backward with RW, heavy reliance on UEs for support; generally unstable with difficulty maintaining NWB R LE.  Requiring +1 physical assist for balance/safety at all times.  Notably fatigued with very minimal exertion (due to energy expenditure required for NWB R LE); unable to effectively demonstrate gait distances away from bed surface as result. Would benefit from skilled PT to address above deficits and promote optimal return to PLOF.; recommend post-acute PT follow up as indicated by interdisciplinary care team.     Orthostatic VS for the past 24 hrs (Last 3 readings):  BP- Lying Pulse- Lying BP- Sitting Pulse- Sitting BP- Standing at 0 minutes Pulse- Standing at 0 minutes  01/16/24 1444 132/57 93 127/54 97 121/64 105         If plan is discharge home, recommend the following: A lot of help with  walking and/or transfers;A lot of help with bathing/dressing/bathroom   Can travel by private vehicle        Equipment Recommendations Rolling walker (2 wheels);BSC/3in1;Wheelchair cushion (measurements PT);Wheelchair (measurements PT)  Recommendations for Other Services       Functional Status Assessment Patient has had a recent decline in their functional status and demonstrates the ability to make significant improvements in function in a reasonable and predictable amount of time.     Precautions / Restrictions Precautions Precautions: Fall Restrictions Weight Bearing Restrictions Per Provider Order: Yes RLE Weight Bearing Per Provider Order: Non weight bearing Other Position/Activity Restrictions: CAM Boot      Mobility  Bed Mobility Overal bed mobility: Needs Assistance Bed Mobility: Supine to Sit, Sit to Supine     Supine to sit: Supervision Sit to supine: Supervision        Transfers Overall transfer level: Needs assistance Equipment used: Rolling walker (2 wheels) Transfers: Sit to/from Stand Sit to Stand: Min assist           General transfer comment: step by step cuing for hand/foot placement and overall sequencing; significant effort for NWB R LE with transitional movements    Ambulation/Gait Ambulation/Gait assistance: Min assist Gait Distance (Feet): 4 Feet Assistive device: Rolling walker (2 wheels)         General Gait Details: 4 hops forward/backward with RW, heavy reliance on UEs for support; generally unstable with difficulty maintaining NWB R LE.  Requiring +1 physical assist for balance/safety at all times  Stairs            Wheelchair Mobility     Tilt Bed    Modified Rankin (Stroke Patients Only)  Balance Overall balance assessment: Needs assistance Sitting-balance support: No upper extremity supported, Feet supported Sitting balance-Leahy Scale: Good     Standing balance support: Bilateral upper extremity  supported Standing balance-Leahy Scale: Fair                               Pertinent Vitals/Pain Pain Assessment Pain Assessment: 0-10 Pain Score: 0-No pain Pain Intervention(s): Limited activity within patient's tolerance, Monitored during session, Repositioned    Home Living Family/patient expects to be discharged to:: Private residence Living Arrangements: Alone Available Help at Discharge: Family;Friend(s);Neighbor;Available 24 hours/day Type of Home: House Home Access: Stairs to enter Entrance Stairs-Rails: Right;Left;Can reach both Entrance Stairs-Number of Steps: 3 steps at garage   Home Layout: One level Home Equipment: Shower seat - built in;Hand held shower head;Cane - single point      Prior Function Prior Level of Function : Independent/Modified Independent             Mobility Comments: Mod indep with household and community mobilization without assist device; does endorse 1-2 falls within previous six months ADLs Comments: Generally independent, driving.     Extremity/Trunk Assessment   Upper Extremity Assessment Upper Extremity Assessment: Overall WFL for tasks assessed    Lower Extremity Assessment Lower Extremity Assessment: Generalized weakness (R LE in CAM boot; wiggles toes, good capillary refill, denies sensory deficit) RLE Deficits / Details: NWB with boot. RLE Coordination: decreased gross motor       Communication   Communication Communication: No apparent difficulties    Cognition Arousal: Alert Behavior During Therapy: WFL for tasks assessed/performed                           PT - Cognition Comments: Oriented to self, location and general situation; limited insight into deficits and overall safety needs; limited recall/carry-over of new information (even between reps) Following commands: Impaired Following commands impaired: Follows one step commands inconsistently     Cueing       General Comments  General comments (skin integrity, edema, etc.): VSS t/o session. Orthostatic vitals assessed noted to be: 135/48 in supine, 122/101 (seated EOB/limited by arm movement), 139/75 in standing.    Exercises Other Exercises Other Exercises: Educated in NWB R LE, functional implications, need for assist with mobility efforts.  Patient voiced understanding; will reinforce as needed   Assessment/Plan    PT Assessment Patient needs continued PT services  PT Problem List Decreased strength;Decreased range of motion;Decreased activity tolerance;Decreased balance;Decreased mobility;Decreased cognition;Decreased knowledge of use of DME;Decreased safety awareness;Decreased knowledge of precautions       PT Treatment Interventions DME instruction;Gait training;Stair training;Therapeutic activities;Functional mobility training;Therapeutic exercise;Balance training;Cognitive remediation;Patient/family education    PT Goals (Current goals can be found in the Care Plan section)  Acute Rehab PT Goals Patient Stated Goal: to get back home by myself PT Goal Formulation: With patient/family Time For Goal Achievement: 01/30/24 Potential to Achieve Goals: Good    Frequency Min 3X/week     Co-evaluation               AM-PAC PT 6 Clicks Mobility  Outcome Measure Help needed turning from your back to your side while in a flat bed without using bedrails?: None Help needed moving from lying on your back to sitting on the side of a flat bed without using bedrails?: None Help needed moving to and from a bed to a  chair (including a wheelchair)?: A Little Help needed standing up from a chair using your arms (e.g., wheelchair or bedside chair)?: A Little Help needed to walk in hospital room?: A Little Help needed climbing 3-5 steps with a railing? : A Lot 6 Click Score: 19    End of Session Equipment Utilized During Treatment: Gait belt Activity Tolerance: Patient tolerated treatment well Patient left:  with call bell/phone within reach;in bed;with bed alarm set;with family/visitor present   PT Visit Diagnosis: Muscle weakness (generalized) (M62.81);History of falling (Z91.81);Difficulty in walking, not elsewhere classified (R26.2)    Time: 8596-8564 PT Time Calculation (min) (ACUTE ONLY): 32 min   Charges:   PT Evaluation $PT Eval Moderate Complexity: 1 Mod PT Treatments $Therapeutic Activity: 8-22 mins PT General Charges $$ ACUTE PT VISIT: 1 Visit         Magdalina Whitehead H. Delores, PT, DPT, NCS 01/16/24, 2:56 PM [phone]

## 2024-01-16 NOTE — Progress Notes (Signed)
 PROGRESS NOTE    [name]   FMW:[id number] DOB: 11-06-1934  DOA: 01/15/2024 Date of Service: 01/16/24 which is hospital day 1  PCP: Rudolpho Norleen BIRCH, MD    Hospital course / significant events:   HPI: [name] is a 89 year old female with history of htn, anxiety, leukopenia requiring transfusion (filgrastim ), who presents ED for chief concerns of syncope.  She reports that she does not know what happened yesterday.  While she noticed that she stood up and felt dizzy and then the next thing, she just was laying on the floor.  She reports this is never happened before.    08/19: to ED. VSS. CT head: No acute intracranial abnormality. Moderate cerebral white matter disease. X-ray does confirm mildly displaced and impacted R lateral malleolus fracture       Consultants:  none  Procedures/Surgeries: none      ASSESSMENT & PLAN:   Syncope Suspect orthostatic in setting of patient standing up and experiencing syncope Complete echo ordered Fall precaution   Anxiety Home alprazolam  0.25 mg p.o. twice daily as needed for anxiety/sleep resumed PDMP reviewed   Closed fracture of lateral malleolus Mildly displaced, impacted fracture Symptomatic support: Oxycodone  5 mg p.o. every 6 hours.  For moderate pain, oxycodone  10 mg every 6 hours as needed for severe pain, 1 day ordered. fall precaution PT to see --> recs for SNF rehab   HTN (hypertension) Amlodipine  10 mg daily    Temporal arteritis (HCC) chronic prednisone  5 mg daily    Overweight based on BMI: Body mass index is 25.82 kg/m.SABRA Significantly low or high BMI is associated with higher medical risk.  Underweight - under 18  overweight - 25 to 29 obese - 30 or more Class 1 obesity: BMI of 30.0 to 34 Class 2 obesity: BMI of 35.0 to 39 Class 3 obesity: BMI of 40.0 to 49 Super Morbid Obesity: BMI 50-59 Super-super Morbid Obesity: BMI 60+ Healthy nutrition and physical activity advised as  adjunct to other disease management and risk reduction treatments    DVT prophylaxis: heparin  IV fluids: no continuous IV fluids  Nutrition: cardiac Central lines / other devices: none  Code Status: FULL CODE ACP documentation reviewed:  none on file in [facility]  [name] needs: SNF placement Medical barriers to dispo: none. Expected medical readiness for discharge once placement arranged.              Subjective / Brief ROS:  Patient reports feeling okay other than some ankle pain  Denies CP/SOB.  Denies new weakness.  Tolerating diet.  Reports no concerns w/ urination/defecation.   Family Communication: daughter at bedside     Objective Findings:  Vitals:   01/16/24 0823 01/16/24 1233 01/16/24 1500 01/16/24 1638  BP:  (!) 124/47 (!) 125/52   Pulse:  95 90   Resp:  20 13   Temp: 97.9 F (36.6 C) 98.5 F (36.9 C)  (!) 97.4 F (36.3 C)  TempSrc: Oral Oral  Oral  SpO2:  100% 97%   Weight:      Height:       No intake or output data in the 24 hours ending 01/16/24 1704 Filed Weights   01/15/24 1009  Weight: 72.6 kg    Examination:  Physical Exam Constitutional:      General: She is not in acute distress.    Appearance: She is not ill-appearing.  Cardiovascular:     Rate and Rhythm: Normal rate and regular rhythm.  Pulmonary:  Effort: Pulmonary effort is normal.     Breath sounds: Normal breath sounds.  Abdominal:     General: Abdomen is flat.     Palpations: Abdomen is soft.  Musculoskeletal:     Right lower leg: No edema.     Left lower leg: No edema.     Comments: NV intact distal LE  Skin:    General: Skin is warm and dry.  Neurological:     General: No focal deficit present.     Mental Status: She is alert and oriented to person, place, and time.  Psychiatric:        Mood and Affect: Mood normal.        Behavior: Behavior normal.          Scheduled Medications:   vitamin C   1,000 mg Oral Daily   cholecalciferol   2,000 Units  Oral Daily   heparin   5,000 Units Subcutaneous Q8H   predniSONE   5 mg Oral Daily   sodium chloride  flush  3 mL Intravenous Q12H    Continuous Infusions:   PRN Medications:  acetaminophen  **OR** acetaminophen , ALPRAZolam , ondansetron  **OR** ondansetron  (ZOFRAN ) IV, oxyCODONE , oxyCODONE   Antimicrobials from admission:  Anti-infectives (From admission, onward)    None           Data Reviewed:  I have personally reviewed the following...  CBC: Recent Labs  Lab 01/15/24 1011 01/16/24 0618  WBC 3.2* 3.0*  HGB 13.6 13.4  HCT 42.6 41.9  MCV 91.6 91.1  PLT 229 220   Basic Metabolic Panel: Recent Labs  Lab 01/15/24 1011 01/16/24 0618  NA 142 142  K 4.0 3.7  CL 104 107  CO2 30 24  GLUCOSE 100* 103*  BUN 16 13  CREATININE 0.90 0.92  CALCIUM 9.1 8.9   GFR: Estimated Creatinine Clearance: 43.9 mL/min (by C-G formula based on SCr of 0.92 mg/dL). Liver Function Tests: Recent Labs  Lab 01/15/24 1011  AST 27  ALT 15  ALKPHOS 87  BILITOT 1.7*  PROT 7.0  ALBUMIN 4.0   No results for input(s): LIPASE, AMYLASE in the last 168 hours. No results for input(s): AMMONIA in the last 168 hours. Coagulation Profile: No results for input(s): INR, PROTIME in the last 168 hours. Cardiac Enzymes: No results for input(s): CKTOTAL, CKMB, CKMBINDEX, TROPONINI in the last 168 hours. BNP (last 3 results) No results for input(s): PROBNP in the last 8760 hours. HbA1C: No results for input(s): HGBA1C in the last 72 hours. CBG: Recent Labs  Lab 01/16/24 0624  GLUCAP 105*   Lipid Profile: No results for input(s): CHOL, HDL, LDLCALC, TRIG, CHOLHDL, LDLDIRECT in the last 72 hours. Thyroid  Function Tests: No results for input(s): TSH, T4TOTAL, FREET4, T3FREE, THYROIDAB in the last 72 hours. Anemia Panel: No results for input(s): VITAMINB12, FOLATE, FERRITIN, TIBC, IRON, RETICCTPCT in the last 72 hours. Most Recent  Urinalysis On File:     Component Value Date/Time   COLORURINE STRAW (A) 01/15/2024 1236   APPEARANCEUR CLEAR (A) 01/15/2024 1236   APPEARANCEUR Clear 11/04/2014 0845   LABSPEC 1.005 01/15/2024 1236   LABSPEC 1.025 08/11/2012 1358   PHURINE 8.0 01/15/2024 1236   GLUCOSEU NEGATIVE 01/15/2024 1236   GLUCOSEU Negative 08/11/2012 1358   HGBUR NEGATIVE 01/15/2024 1236   BILIRUBINUR NEGATIVE 01/15/2024 1236   BILIRUBINUR Negative 11/04/2014 0845   BILIRUBINUR Negative 08/11/2012 1358   KETONESUR NEGATIVE 01/15/2024 1236   PROTEINUR NEGATIVE 01/15/2024 1236   UROBILINOGEN 1.0 08/30/2007 2131   NITRITE NEGATIVE 01/15/2024 1236  LEUKOCYTESUR NEGATIVE 01/15/2024 1236   LEUKOCYTESUR Negative 08/11/2012 1358   Sepsis Labs: @LABRCNTIP (procalcitonin:4,lacticidven:4) Microbiology: No results found for this or any previous visit (from the past 240 hours).    Radiology Studies last 3 days: ECHOCARDIOGRAM COMPLETE Result Date: 01/16/2024    ECHOCARDIOGRAM REPORT   Patient Name:   [name] [facility] Date of Exam: 01/15/2024 Medical Rec #:  [id number]           Height:       66.0 in Accession #:    [id number]          Weight:       160.0 lb Date of Birth:  1935-05-23           BSA:          1.819 m Patient Age:    89 years            BP:           150/52 mmHg Patient Gender: F                   HR:           71 bpm. Exam Location:  [facility] Procedure: 2D Echo, Cardiac Doppler and Color Doppler (Both Spectral and Color            Flow Doppler were utilized during procedure). Indications:     Dyspnea R06.00                  Syncope R55  History:         Patient has no prior history of Echocardiogram examinations.                  Risk Factors:Hypertension and Dyslipidemia.  Sonographer:     Christopher Furnace Referring Phys:  [id number] AMY N COX Diagnosing Phys: Cara JONETTA Lovelace MD  Sonographer Comments: Image acquisition challenging due to breast implants. IMPRESSIONS  1. Left ventricular ejection fraction, by  estimation, is 55 to 60%. The left ventricle has normal function. The left ventricle has no regional wall motion abnormalities. There is mild asymmetric left ventricular hypertrophy of the basal segment. Left ventricular diastolic parameters are consistent with Grade I diastolic dysfunction (impaired relaxation).  2. Right ventricular systolic function is normal. The right ventricular size is normal.  3. The mitral valve is grossly normal. Mild mitral valve regurgitation.  4. The aortic valve is normal in structure. Aortic valve regurgitation is mild. Aortic valve sclerosis is present, with no evidence of aortic valve stenosis. Conclusion(s)/Recommendation(s): Poor windows for evaluation of left ventricular function by transthoracic echocardiography. Would recommend an alternative means of evaluation. FINDINGS  Left Ventricle: Left ventricular ejection fraction, by estimation, is 55 to 60%. The left ventricle has normal function. The left ventricle has no regional wall motion abnormalities. Strain was performed and the global longitudinal strain is indeterminate. The left ventricular internal cavity size was normal in size. There is mild asymmetric left ventricular hypertrophy of the basal segment. Left ventricular diastolic parameters are consistent with Grade I diastolic dysfunction (impaired relaxation). Right Ventricle: The right ventricular size is normal. No increase in right ventricular wall thickness. Right ventricular systolic function is normal. Left Atrium: Left atrial size was normal in size. Right Atrium: Right atrial size was normal in size. Pericardium: There is no evidence of pericardial effusion. Mitral Valve: The mitral valve is grossly normal. There is mild thickening of the mitral valve leaflet(s). There is mild calcification of the mitral valve leaflet(s).  Mild mitral valve regurgitation. MV peak gradient, 16.0 mmHg. The mean mitral valve gradient is 8.0 mmHg. Tricuspid Valve: The tricuspid  valve is normal in structure. Tricuspid valve regurgitation is mild. Aortic Valve: The aortic valve is normal in structure. Aortic valve regurgitation is mild. Aortic valve sclerosis is present, with no evidence of aortic valve stenosis. Aortic valve mean gradient measures 4.5 mmHg. Aortic valve peak gradient measures 7.2  mmHg. Aortic valve area, by VTI measures 3.30 cm. Pulmonic Valve: The pulmonic valve was normal in structure. Pulmonic valve regurgitation is not visualized. Aorta: The ascending aorta was not well visualized. IAS/Shunts: No atrial level shunt detected by color flow Doppler. Additional Comments: 3D was performed not requiring image post processing on an independent workstation and was indeterminate.  LEFT VENTRICLE PLAX 2D LVIDd:         3.20 cm   Diastology LVIDs:         2.30 cm   LV e' medial:    7.94 cm/s LV PW:         1.30 cm   LV E/e' medial:  15.9 LV IVS:        1.10 cm   LV e' lateral:   5.87 cm/s LVOT diam:     2.00 cm   LV E/e' lateral: 21.5 LV SV:         91 LV SV Index:   50 LVOT Area:     3.14 cm  RIGHT VENTRICLE RV Basal diam:  2.70 cm RV Mid diam:    2.40 cm RV S prime:     15.00 cm/s TAPSE (M-mode): 1.7 cm LEFT ATRIUM             Index        RIGHT ATRIUM           Index LA diam:        2.90 cm 1.59 cm/m   RA Area:     11.80 cm LA Vol (A2C):   55.0 ml 30.24 ml/m  RA Volume:   25.30 ml  13.91 ml/m LA Vol (A4C):   30.5 ml 16.77 ml/m LA Biplane Vol: 45.7 ml 25.13 ml/m  AORTIC VALVE AV Area (Vmax):    2.84 cm AV Area (Vmean):   2.77 cm AV Area (VTI):     3.30 cm AV Vmax:           134.00 cm/s AV Vmean:          97.200 cm/s AV VTI:            0.275 m AV Peak Grad:      7.2 mmHg AV Mean Grad:      4.5 mmHg LVOT Vmax:         121.00 cm/s LVOT Vmean:        85.600 cm/s LVOT VTI:          0.289 m LVOT/AV VTI ratio: 1.05  AORTA Ao Root diam: 3.00 cm MITRAL VALVE                TRICUSPID VALVE MV Area (PHT): 1.97 cm     TR Peak grad:   16.2 mmHg MV Area VTI:   1.83 cm     TR  Vmax:        201.00 cm/s MV Peak grad:  16.0 mmHg MV Mean grad:  8.0 mmHg     SHUNTS MV Vmax:       2.00 m/s     Systemic VTI:  0.29 m MV Vmean:      132.0 cm/s   Systemic Diam: 2.00 cm MV Decel Time: 385 msec MV E velocity: 126.00 cm/s MV A velocity: 164.00 cm/s MV E/A ratio:  0.77 Cara JONETTA Lovelace MD Electronically signed by Cara JONETTA Lovelace MD Signature Date/Time: 01/16/2024/7:58:04 AM    Final    Portable Chest 1 View Result Date: 01/15/2024 CLINICAL DATA:  Syncope. Loss of consciousness with resulting right ankle injury. EXAM: PORTABLE CHEST 1 VIEW COMPARISON:  Radiographs 04/11/2023 and 09/21/2017.  CT 09/21/2017. FINDINGS: 1315 hours. The heart size and mediastinal contours are stable with mild aortic atherosclerosis. There is stable mild scarring at both lung apices. The lungs are otherwise clear. No pleural effusion or pneumothorax. No acute osseous findings are evident. Bilateral breast implants are noted. IMPRESSION: No evidence of acute cardiopulmonary process. Aortic atherosclerosis. Electronically Signed   By: Elsie Perone M.D.   On: 01/15/2024 13:36   CT Cervical Spine Wo Contrast Result Date: 01/15/2024 CLINICAL DATA:  Neck trauma.  Syncopal episode yesterday with fall. EXAM: CT CERVICAL SPINE WITHOUT CONTRAST TECHNIQUE: Multidetector CT imaging of the cervical spine was performed without intravenous contrast. Multiplanar CT image reconstructions were also generated. RADIATION DOSE REDUCTION: This exam was performed according to the departmental dose-optimization program which includes automated exposure control, adjustment of the mA and/or kV according to patient size and/or use of iterative reconstruction technique. COMPARISON:  None Available. FINDINGS: Alignment: Normal. Skull base and vertebrae: No evidence of acute fracture or traumatic subluxation. Soft tissues and spinal canal: No prevertebral fluid or swelling. No visible canal hematoma. Disc levels: Mild-to-moderate multilevel  spondylosis for age with disc space narrowing, endplate osteophytes, uncinate spurring and facet hypertrophy. No large disc herniation identified. Up to moderate osseous foraminal narrowing which appears worst at C5-6 and C6-7. Upper chest: Mild biapical scarring. Aortic and carotid atherosclerosis. Other: Bilateral TMJ degenerative changes. IMPRESSION: 1. No evidence of acute cervical spine fracture, traumatic subluxation or static signs of instability. 2. Mild-to-moderate multilevel cervical spondylosis for age. 3.  Aortic Atherosclerosis (ICD10-I70.0). Electronically Signed   By: Elsie Perone M.D.   On: 01/15/2024 11:23   CT HEAD WO CONTRAST ( ) Result Date: 01/15/2024 EXAM: CT HEAD WITHOUT CONTRAST 01/15/2024 11:09:07 AM TECHNIQUE: CT of the head was performed without the administration of intravenous contrast. Automated exposure control, iterative reconstruction, and/or weight based adjustment of the mA/kV was utilized to reduce the radiation dose to as low as reasonably achievable. COMPARISON: CT of the head dated 09/21/2017. CLINICAL HISTORY: Head trauma, minor (Age >= 65y). Pt to ED from with family member for R ankle injury from syncopal episode yesterday at 5pm when she was getting up out of a chair. Both lower legs have bronze discoloration, but R lower leg is red and swollen. Painful to bear weight. Also daughter in law mentions some decline in cognitive function and mental status since last few months. Pt is alert and oriented. FINDINGS: BRAIN AND VENTRICLES: No acute hemorrhage. No mass. No acute cortical infarct. No hydrocephalus. Moderate cerebral white matter disease. ORBITS: Patient is status post bilateral lens surgery. The patient is status post bilateral lens replacement. SINUSES: Mild mucosal disease within the right ethmoid air cells and the sphenoid sinus. SOFT TISSUES AND SKULL: No acute soft tissue abnormality. No skull fracture. IMPRESSION: 1. No acute intracranial abnormality. 2.  Moderate cerebral white matter disease. Electronically signed by: Evalene Coho MD 01/15/2024 11:21 AM EDT RP Workstation: [id number]   DG Tibia/Fibula Right Result Date: 01/15/2024  CLINICAL DATA:  fall.  Pain in the ankle and foot. EXAM: RIGHT FOOT COMPLETE - 3+ VIEW; RIGHT TIBIA AND FIBULA - 2 VIEW COMPARISON:  None Available. FINDINGS: There is mildly displaced and impacted fracture of the lateral malleolus with mild-to-moderate overlying soft tissue swelling. No other acute fracture or dislocation. No aggressive osseous lesion. Ankle mortise appears intact. Mild degenerative changes of the imaged joints. No focal soft tissue swelling. No radiopaque foreign bodies. IMPRESSION: *Mildly displaced and impacted fracture of the lateral malleolus. Electronically Signed   By: Ree Molt M.D.   On: 01/15/2024 10:44   DG Foot Complete Right Result Date: 01/15/2024 CLINICAL DATA:  fall.  Pain in the ankle and foot. EXAM: RIGHT FOOT COMPLETE - 3+ VIEW; RIGHT TIBIA AND FIBULA - 2 VIEW COMPARISON:  None Available. FINDINGS: There is mildly displaced and impacted fracture of the lateral malleolus with mild-to-moderate overlying soft tissue swelling. No other acute fracture or dislocation. No aggressive osseous lesion. Ankle mortise appears intact. Mild degenerative changes of the imaged joints. No focal soft tissue swelling. No radiopaque foreign bodies. IMPRESSION: *Mildly displaced and impacted fracture of the lateral malleolus. Electronically Signed   By: Ree Molt M.D.   On: 01/15/2024 10:44          Laneta Blunt, DO [facility] 01/16/2024, 5:04 PM    Dictation software may have been used to generate the above note. Typos may occur and escape review in typed/dictated notes. Please contact Dr Blunt directly for clarity if needed.  Staff may message me via secure chat in Epic  but this may not receive an immediate response,  please page me for urgent matters!  If  7PM-7AM, please contact night coverage [email or web address]

## 2024-01-16 NOTE — TOC CAGE-AID Note (Signed)
 Transition of Care [facility]) - CAGE-AID Screening   Patient Details  Name: [name] MRN: [id number] Date of Birth: Mar 04, 1935  [facility]) CM/SW Contact:    Edsel DELENA Fischer, LCSW Phone Number: 01/16/2024, 4:46 PM   Clinical Narrative:  SW met with pt at bedside.  Pt son and daughter were present as well.  Pt gave verbal permission to speak with her children.  Pt  live alone however has the support of family and friends. Pt is open to going to rehab. Pt picked [facility] and [address].  SW to start placement process.   CAGE-AID Screening:

## 2024-01-16 NOTE — NC FL2 (Signed)
 [place]  MEDICAID [id number] LEVEL OF CARE FORM     IDENTIFICATION  Patient Name: [name] Birthdate: 03/09/1935 Sex: female Admission Date (Current Location): 01/15/2024  [facility] and [place] Number:  Chiropodist and Address:  [facility], [address], [place], [place] [address]      Provider Number: [id number]  Attending Physician Name and Address:  Marsa Edelman, DO  Relative Name and Phone Number:  PERRI SLOUGH (Daughter)  [phone]    Current Level of Care: Hospital Recommended Level of Care: Skilled Nursing Facility Prior Approval Number:    Date Approved/Denied:   PASRR Number: [id number]  Discharge Plan: SNF    Current Diagnoses: Patient Active Problem List   Diagnosis Date Noted   Syncope 01/15/2024   Closed fracture of lateral malleolus 01/15/2024   Anxiety 01/15/2024   Lymphedema 12/31/2022   Pancreatitis 10/09/2022   HTN (hypertension) 10/09/2022   Cardiac syncope 01/19/2022   SOBOE (shortness of breath on exertion) 01/19/2022   Chronic venous insufficiency 09/29/2021   Osteopenia 12/30/2019   Neutropenia (HCC) 12/30/2019   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   Other neutropenia (HCC) 07/17/2018   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   Diverticulitis of both large and small intestine with abscess 10/11/2017   Weight loss 09/30/2017   Night sweats 09/30/2017   Interface dermatitis, 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   Leukopenia 09/02/2017   Maculopapular rash 09/02/2017   Diverticulitis 08/02/2017   Diverticulitis of large intestine with complication    Diverticulitis of large intestine with perforation and abscess without bleeding    Pyelonephritis    Dehydration    Abscess of sigmoid colon    Acute diverticulitis 06/14/2017   Atherosclerosis of native arteries of extremity with intermittent claudication (HCC) 08/01/2016   PAD (peripheral artery disease) (HCC) 05/10/2016    Pain in limb 05/02/2016   Atrophic vaginitis 11/04/2014   Cystocele, grade 2 11/04/2014   Nephrolithiasis 11/04/2014   H/O neoplasm 07/27/2014   History of nonmelanoma skin cancer 07/27/2014   Hyperlipidemia 04/14/2014   Osteoporosis 01/07/2014   Osteoarthritis 01/07/2014   Biliary tract disorder 01/07/2014   Anosmia 03/19/2013   PANCREATITIS 05/27/2008   NAUSEA 05/27/2008   DIARRHEA 05/27/2008   ABDOMINAL PAIN-EPIGASTRIC 05/27/2008   Temporal arteritis (HCC) 08/05/2007   OSTEOPOROSIS 08/05/2007   IRRITABLE BOWEL SYNDROME, HX OF 08/05/2007   DIVERTICULOSIS, COLON 03/08/2007   COLONIC POLYPS, ADENOMATOUS 10/28/1998   GERD 10/28/1998   HIATAL HERNIA 10/28/1998    Orientation RESPIRATION BLADDER Height & Weight     Self, Time, Situation, Place  Normal Continent Weight: 160 lb (72.6 kg) Height:  5' 6 (167.6 cm)  BEHAVIORAL SYMPTOMS/MOOD NEUROLOGICAL BOWEL NUTRITION STATUS      Continent Diet  AMBULATORY STATUS COMMUNICATION OF NEEDS Skin   Limited Assist Verbally Skin abrasions                       Personal Care Assistance Level of Assistance  Bathing, Feeding, Dressing Bathing Assistance: Limited assistance Feeding assistance: Independent Dressing Assistance: Limited assistance     Functional Limitations Info  Sight, Hearing, Speech Sight Info: Adequate Hearing Info: Adequate Speech Info: Adequate    SPECIAL CARE FACTORS FREQUENCY  PT (By licensed PT), OT (By licensed OT)     PT Frequency: 5x a week OT Frequency: 3x a week            Contractures Contractures Info: Not present    Additional Factors Info  Code Status,  Allergies Code Status Info: FULL Allergies Info: Morphine ,Benzalkonium Chloride,Escitalopram,Hydralazine,Neomycin-bacitracin Zn-polymyx,Bacitracin-neomycin-polymyxin           Current Medications (01/16/2024):  This is the current hospital active medication list Current Facility-Administered Medications  Medication Dose Route  Frequency Provider Last Rate Last Admin   acetaminophen  (TYLENOL ) tablet 650 mg  650 mg Oral Q6H PRN Cox, Amy N, DO       Or   acetaminophen  (TYLENOL ) suppository 650 mg  650 mg Rectal Q6H PRN Cox, Amy N, DO       ALPRAZolam  (XANAX ) tablet 0.25 mg  0.25 mg Oral BID PRN Cox, Amy N, DO       ascorbic acid  (VITAMIN C ) tablet 1,000 mg  1,000 mg Oral Daily Cox, Amy N, DO   1,000 mg at 01/16/24 0944   cholecalciferol  (VITAMIN D3) 25 MCG (1000 UNIT) tablet 2,000 Units  2,000 Units Oral Daily Cox, Amy N, DO   2,000 Units at 01/16/24 0943   heparin  injection 5,000 Units  5,000 Units Subcutaneous Q8H Cox, Amy N, DO   5,000 Units at 01/16/24 1500   ondansetron  (ZOFRAN ) tablet 4 mg  4 mg Oral Q6H PRN Cox, Amy N, DO       Or   ondansetron  (ZOFRAN ) injection 4 mg  4 mg Intravenous Q6H PRN Cox, Amy N, DO       oxyCODONE  (Oxy IR/ROXICODONE ) immediate release tablet 10 mg  10 mg Oral Q6H PRN Alexander, Natalie, DO       oxyCODONE  (Oxy IR/ROXICODONE ) immediate release tablet 5 mg  5 mg Oral Q6H PRN Alexander, Natalie, DO   5 mg at 01/16/24 1535   predniSONE  (DELTASONE ) tablet 5 mg  5 mg Oral Daily Cox, Amy N, DO   5 mg at 01/16/24 0944   sodium chloride  flush (NS) 0.9 % injection 3 mL  3 mL Intravenous Q12H Cox, Amy N, DO   3 mL at 01/16/24 0945   Current Outpatient Medications  Medication Sig Dispense Refill   ALPRAZolam  (XANAX ) 0.25 MG tablet Take 0.25 mg by mouth 2 (two) times daily as needed.      amLODipine  (NORVASC ) 10 MG tablet Take 10 mg by mouth daily.     amoxicillin -clavulanate (AUGMENTIN ) 875-125 MG tablet Take 1 tablet by mouth 2 (two) times daily.     Ascorbic Acid  (VITAMIN C ) 1000 MG tablet Take 1,000 mg by mouth daily.     b complex vitamins capsule Take 1 capsule by mouth daily.     Biotin  1000 MCG tablet Take 1,000 mcg by mouth daily.      Cholecalciferol  (VITAMIN D3) 2000 units capsule Take 2,000 Units by mouth daily.     Cyanocobalamin  (B-12 PO) Take 2,500 mcg by mouth daily.      predniSONE  (DELTASONE ) 5 MG tablet Take 5 mg by mouth daily.     traMADol  (ULTRAM ) 50 MG tablet Take 50 mg by mouth daily.     ZINC OXIDE PO Take 1 tablet by mouth daily.     Apoaequorin 10 MG CAPS Take 1 capsule by mouth daily.     aspirin  EC 81 MG tablet Take 81 mg daily by mouth. (Patient not taking: Reported on 01/15/2024)     linaclotide  (LINZESS ) 145 MCG CAPS capsule Take 1 capsule (145 mcg total) by mouth daily before breakfast. (Patient not taking: Reported on 10/02/2023) 30 capsule 5     Discharge Medications: Please see discharge summary for a list of discharge medications.  Relevant Imaging Results:  Relevant Lab  Results:   Additional Information SS# [id number], DOB: 05-04-2035  Edsel DELENA Fischer, LCSW

## 2024-01-16 NOTE — ED Notes (Signed)
 This tech assisted pt. to the bedside commode at this time.

## 2024-01-16 NOTE — Evaluation (Signed)
 Occupational Therapy Evaluation Patient Details Name: [name] MRN: [id number] DOB: 06-Aug-1934 Today's Date: 01/16/2024   History of Present Illness   [name] is a 89 year old female with history of htn, anxiety, leukopenia requiring transfusion (filgrastim ), who presents ED for chief concerns of syncope, mechanical fall, and R ankle pain. Imaging shows R Mildly displaced and impacted fracture of the lateral malleolus.     Clinical Impressions [name] was seen for OT evaluation this date. Prior to hospital admission, pt was generally independent with ADL/IADL management, she endorses driving, and denies additional falls history in the last 6 months. Pt lives alone in a 1 level home with 3 STE and bilateral hand rails. Pt presents with deficits in pain management with mobility, RLE weakness, decreased activity tolerance and balance affecting safe and optimal ADL completion. Pt currently requires MIN A for functional transfers, MIN A For LB dressing and bathing from STS, supervision for toileting.  Pt would benefit from skilled OT services to address noted impairments and functional limitations (see below for any additional details) in order to maximize safety and independence while minimizing future risk of falls, injury, and readmission. Do Anticipate the need for follow up OT services upon acute hospital DC.      If plan is discharge home, recommend the following:   A little help with walking and/or transfers;A little help with bathing/dressing/bathroom;Help with stairs or ramp for entrance;Assist for transportation;Assistance with cooking/housework     Functional Status Assessment   Patient has had a recent decline in their functional status and demonstrates the ability to make significant improvements in function in a reasonable and predictable amount of time.     Equipment Recommendations   BSC/3in1     Recommendations for Other Services          Precautions/Restrictions   Precautions Precautions: Fall Recall of Precautions/Restrictions: Intact Restrictions Weight Bearing Restrictions Per Provider Order: Yes RLE Weight Bearing Per Provider Order: Non weight bearing Other Position/Activity Restrictions: CAM Boot     Mobility Bed Mobility Overal bed mobility: Needs Assistance Bed Mobility: Supine to Sit, Sit to Supine     Supine to sit: Supervision, Used rails Sit to supine: Supervision, Used rails        Transfers Overall transfer level: Needs assistance Equipment used: Rolling walker (2 wheels) Transfers: Sit to/from Stand, Bed to chair/wheelchair/BSC Sit to Stand: Min assist     Step pivot transfers: Contact guard assist     General transfer comment: MIN A for initial STS from EOB/BSC able to hop pivot to/from [name] with CGA for safety and cueing for adherence to NWB status.      Balance Overall balance assessment: Needs assistance Sitting-balance support: No upper extremity supported (LLE supported) Sitting balance-Leahy Scale: Good Sitting balance - Comments: steady static sitting, reaching inside BOS. Able to maintain NWB precautions well at EOB.   Standing balance support: Reliant on assistive device for balance, During functional activity, Bilateral upper extremity supported Standing balance-Leahy Scale: Fair                             ADL either performed or assessed with clinical judgement   ADL Overall ADL's : Needs assistance/impaired                                       General ADL Comments: Pt functionally  limited by increased pain in RLE and NWB status. She is able to maintain NWB precautions when hop pivoting to BSC with MIN A and using a RW. MOD I for peri-care with seated lateral leans. Anticipate MIN-MOD A for LB dressing/bathing.     Vision Baseline Vision/History: 1 Wears glasses Patient Visual Report: No change from baseline       Perception          Praxis         Pertinent Vitals/Pain Pain Assessment Pain Assessment: Faces Faces Pain Scale: Hurts little more Pain Location: RLE with movement Pain Descriptors / Indicators: Sore Pain Intervention(s): Limited activity within patient's tolerance, Monitored during session, Repositioned     Extremity/Trunk Assessment Upper Extremity Assessment Upper Extremity Assessment: Overall WFL for tasks assessed   Lower Extremity Assessment Lower Extremity Assessment: RLE deficits/detail;Defer to PT evaluation RLE Deficits / Details: NWB with boot. RLE Coordination: decreased gross motor       Communication Communication Communication: No apparent difficulties   Cognition Arousal: Alert Behavior During Therapy: WFL for tasks assessed/performed Cognition: No apparent impairments             OT - Cognition Comments: Pleasant, conversational, A&Ox4                         Cueing  General Comments      VSS t/o session. Orthostatic vitals assessed noted to be: 135/48 in supine, 122/101 (seated EOB/limited by arm movement), 139/75 in standing.   Exercises Other Exercises Other Exercises: Pt educated on role of OT in acute setting, safety, falls prevention strategies, WB precautions, transfer technique, and DC recs.   Shoulder Instructions      Home Living Family/patient expects to be discharged to:: Private residence Living Arrangements: Alone Available Help at Discharge: Family;Friend(s);Neighbor;Available 24 hours/day Type of Home: House Home Access: Stairs to enter [employer] of Steps: 3 steps at garage Entrance Stairs-Rails: Right;Left;Can reach both Home Layout: One level     Bathroom Shower/Tub: Walk-in shower         Home Equipment: Shower seat - built in;Hand held Engineer, civil (consulting) - single point          Prior Functioning/Environment Prior Level of Function : Independent/Modified Independent             Mobility Comments:  Denies use of AE. Denies additional falls history in last 6 months. ADLs Comments: Generally independent, driving.    OT Problem List: Decreased coordination;Decreased strength;Pain;Decreased safety awareness;Decreased knowledge of use of DME or AE;Impaired balance (sitting and/or standing);Decreased knowledge of precautions   OT Treatment/Interventions: Self-care/ADL training;Therapeutic exercise;Therapeutic activities;DME and/or AE instruction;Patient/family education;Balance training;Energy conservation      OT Goals(Current goals can be found in the care plan section)   Acute Rehab OT Goals Patient Stated Goal: To go home OT Goal Formulation: With patient Time For Goal Achievement: 01/30/24 Potential to Achieve Goals: Good ADL Goals Pt Will Perform Grooming: sitting;with modified independence Pt Will Perform Lower Body Dressing: sit to/from stand;with adaptive equipment;with caregiver independent in assisting;with contact guard assist Pt Will Transfer to Toilet: bedside commode;stand pivot transfer;with supervision;with set-up Pt Will Perform Toileting - Clothing Manipulation and hygiene: sitting/lateral leans;sit to/from stand;with modified independence   OT Frequency:  Min 2X/week    Co-evaluation              AM-PAC OT 6 Clicks Daily Activity     Outcome Measure Help from another person eating meals?: None  Help from another person taking care of personal grooming?: None Help from another person toileting, which includes using toliet, bedpan, or urinal?: A Little Help from another person bathing (including washing, rinsing, drying)?: A Little Help from another person to put on and taking off regular upper body clothing?: A Little Help from another person to put on and taking off regular lower body clothing?: A Little 6 Click Score: 20   End of Session Equipment Utilized During Treatment: Gait belt;Rolling walker (2 wheels) Nurse Communication: Mobility  status  Activity Tolerance: Patient tolerated treatment well Patient left: in bed;with call bell/phone within reach;with bed alarm set  OT Visit Diagnosis: Other abnormalities of gait and mobility (R26.89);Pain Pain - Right/Left: Right Pain - part of body: Leg;Ankle and joints of foot                Time: 9052-8969 OT Time Calculation (min): 43 min Charges:  OT General Charges $OT Visit: 1 Visit OT Evaluation $OT Eval Moderate Complexity: 1 Mod OT Treatments $Self Care/Home Management : 8-22 mins  Jhonny Pelton, M.S., OTR/L 01/16/24, 2:38 PM

## 2024-01-17 DIAGNOSIS — S8263XA Displaced fracture of lateral malleolus of unspecified fibula, initial encounter for closed fracture: Secondary | ICD-10-CM | POA: Diagnosis not present

## 2024-01-17 DIAGNOSIS — R55 Syncope and collapse: Secondary | ICD-10-CM | POA: Diagnosis not present

## 2024-01-17 LAB — GLUCOSE, CAPILLARY: Glucose-Capillary: 97 mg/dL (ref 70–99)

## 2024-01-17 MED ORDER — ENOXAPARIN SODIUM 40 MG/0.4ML IJ SOSY
40.0000 mg | PREFILLED_SYRINGE | INTRAMUSCULAR | Status: DC
  Administered 2024-01-17 – 2024-01-21 (×5): 40 mg via SUBCUTANEOUS
  Filled 2024-01-17 (×5): qty 0.4

## 2024-01-17 NOTE — Care Management Important Message (Signed)
 Important Message  Patient Details  Name: [name] MRN: [id number] Date of Birth: [birth year]   Important Message Given:  Yes - Medicare IM     [name] 01/17/2024, 12:49 PM

## 2024-01-17 NOTE — Progress Notes (Signed)
 Occupational Therapy Treatment Patient Details Name: [name] MRN: [id number] DOB: [birth year] Today's Date: 01/17/2024   History of present illness [name] is a 89 year old female with history of htn, anxiety, leukopenia requiring transfusion (filgrastim ), who presents ED for chief concerns of syncope, mechanical fall, and R ankle pain. Imaging shows R Mildly displaced and impacted fracture of the lateral malleolus.   OT comments  [name] was seen for OT treatment on this date. Upon arrival to room pt in bed, agreeable to tx. Mild confusion noted, per family in room suspect r/t medications. Educated on delirium pcns and importance of OOB to chair for functional strengthening. Pt requires MOD A for bed>chair SPT, MIN A sit<>stand, extensive cueing for hand and RW placement. SETUP seated grooming tasks. Pt making good progress toward goals, will continue to follow POC. Discharge recommendation remains appropriate.        If plan is discharge home, recommend the following:  A little help with walking and/or transfers;A little help with bathing/dressing/bathroom;Help with stairs or ramp for entrance;Assist for transportation;Assistance with cooking/housework   Equipment Recommendations  BSC/3in1    Recommendations for Other Services      Precautions / Restrictions Precautions Precautions: Fall Recall of Precautions/Restrictions: Impaired Required Braces or Orthoses: Other Brace Other Brace: CAM boot Restrictions Weight Bearing Restrictions Per Provider Order: Yes RLE Weight Bearing Per Provider Order: Non weight bearing Other Position/Activity Restrictions: CAM Boot       Mobility Bed Mobility Overal bed mobility: Needs Assistance Bed Mobility: Supine to Sit     Supine to sit: Min assist          Transfers Overall transfer level: Needs assistance Equipment used: Rolling walker (2 wheels) Transfers: Sit to/from Stand, Bed to  chair/wheelchair/BSC Sit to Stand: Min assist Stand pivot transfers: Mod assist               Balance Overall balance assessment: Needs assistance Sitting-balance support: No upper extremity supported, Feet supported Sitting balance-Leahy Scale: Good     Standing balance support: Bilateral upper extremity supported Standing balance-Leahy Scale: Poor                             ADL either performed or assessed with clinical judgement   ADL Overall ADL's : Needs assistance/impaired                                       General ADL Comments: MOD A for simulated BSC t/f. SETUP seated grooming tasks     Communication Communication Communication: No apparent difficulties   Cognition Arousal: Alert Behavior During Therapy: WFL for tasks assessed/performed Cognition: Cognition impaired       Memory impairment (select all impairments): Short-term memory     OT - Cognition Comments: suspect r/t medications, pt initially states let me get dressed before our meeting and requires re-orientation to location/situation.                 Following commands: Impaired Following commands impaired: Follows one step commands inconsistently      Cueing   Cueing Techniques: Verbal cues, Tactile cues, Visual cues  Exercises      Shoulder Instructions       General Comments      Pertinent Vitals/ Pain       Pain Assessment Pain Assessment: 0-10 Pain Score: 7  Pain Location: RLE with movement Pain Descriptors / Indicators: Sore Pain Intervention(s): Limited activity within patient's tolerance, Repositioned   Frequency  Min 2X/week        Progress Toward Goals  OT Goals(current goals can now be found in the care plan section)  Progress towards OT goals: Progressing toward goals  Acute Rehab OT Goals OT Goal Formulation: With patient Time For Goal Achievement: 01/30/24 Potential to Achieve Goals: Good ADL Goals Pt Will Perform  Grooming: sitting;with modified independence Pt Will Perform Lower Body Dressing: sit to/from stand;with adaptive equipment;with caregiver independent in assisting;with contact guard assist Pt Will Transfer to Toilet: bedside commode;stand pivot transfer;with supervision;with set-up Pt Will Perform Toileting - Clothing Manipulation and hygiene: sitting/lateral leans;sit to/from stand;with modified independence  Plan      Co-evaluation                 AM-PAC OT 6 Clicks Daily Activity     Outcome Measure   Help from another person eating meals?: None Help from another person taking care of personal grooming?: None Help from another person toileting, which includes using toliet, bedpan, or urinal?: A Little Help from another person bathing (including washing, rinsing, drying)?: A Little Help from another person to put on and taking off regular upper body clothing?: A Little Help from another person to put on and taking off regular lower body clothing?: A Little 6 Click Score: 20    End of Session Equipment Utilized During Treatment: Gait belt;Rolling walker (2 wheels)  OT Visit Diagnosis: Other abnormalities of gait and mobility (R26.89);Pain   Activity Tolerance Patient tolerated treatment well   Patient Left in chair;with call bell/phone within reach;with chair alarm set;with family/visitor present   Nurse Communication          Time: 9150-9081 OT Time Calculation (min): 29 min  Charges: OT General Charges $OT Visit: 1 Visit OT Treatments $Self Care/Home Management : 23-37 mins  Elston Slot, M.S. OTR/L  01/17/24, 10:10 AM  ascom [phone]

## 2024-01-17 NOTE — TOC Progression Note (Addendum)
 Transition of Care [facility]) - Progression Note    Patient Details  Name: [name] MRN: [id number] Date of Birth: [birth year]  Transition [facility]) CM/SW Contact  Lauraine JAYSON Carpen, LCSW Phone Number: 01/17/2024, 10:45 AM  Clinical Narrative:   [name] is not in network with patient's insurance. [facility] will not have a bed until Monday. Per [facility] worker, they are also in the middle of a COVID outbreak. CSW met with patient and daughter-in-law. Also had daughter on speaker phone. Discussed other preferences. They would like for [facility] [place] to review. CSW sent referral and left a message for the admissions coordinator to notify. Will send referral to other facilities in the area to determine other options.  2:45 pm: Left voicemail for daughter with bed offers.  3:35 pm: Received call back from daughter. Will follow up with [facility] in the morning to see if it will still be Monday before a bed opens up. Daughter is considering take patient home with home health if [facility] won't have a bed in the near future and someone can stay with patient.   Expected Discharge Plan and Services                                               Social Drivers of Health (SDOH) Interventions SDOH Screenings   Food Insecurity: No Food Insecurity (01/16/2024)  Housing: Low Risk  (01/16/2024)  Transportation Needs: No Transportation Needs (01/16/2024)  Utilities: Not At Risk (01/16/2024)  Social Connections: Socially Isolated (01/16/2024)  Tobacco Use: Medium Risk (01/15/2024)    Readmission Risk Interventions     No data to display

## 2024-01-17 NOTE — Progress Notes (Signed)
 Physical Therapy Treatment Patient Details Name: [name] MRN: [id number] DOB: 06-10-1934 Today's Date: 01/17/2024   History of Present Illness [name] is an 89 year old female with history of HTN, anxiety, leukopenia requiring transfusion (filgrastim ), who presents ED for chief concerns of syncope, mechanical fall, and R ankle pain. Imaging shows R mildly displaced and impacted fracture of the lateral malleolus.    PT Comments  Pt was pleasant and motivated to participate during the session and put forth good effort throughout. Pt required mod multi-modal cuing for proper sequencing during sit to/from stand transfer training with pt's R foot placed on top of this PT's foot to ensure WB compliance. Pt was able to come to standing with only minimal assist while pulling up from the sink basin and was compliant with WB status throughout.  Pt tolerated below therex well including manual resistance as appropriate.  Pt reported no adverse symptoms during the session other than mod R ankle pain that did not worsen with activity.  Pt will benefit from continued PT services upon discharge to safely address deficits listed in patient problem list for decreased caregiver assistance and eventual return to PLOF.       If plan is discharge home, recommend the following: A lot of help with walking and/or transfers;A lot of help with bathing/dressing/bathroom   Can travel by private vehicle     No  Equipment Recommendations  Rolling walker (2 wheels);BSC/3in1;Wheelchair cushion (measurements PT);Wheelchair (measurements PT);Other (comment) (TBD at next venue of care)    Recommendations for Other Services       Precautions / Restrictions Precautions Precautions: Fall Recall of Precautions/Restrictions: Impaired Required Braces or Orthoses: Other Brace Other Brace: CAM boot Restrictions Weight Bearing Restrictions Per Provider Order: Yes RLE Weight Bearing Per Provider Order: Non  weight bearing Other Position/Activity Restrictions: CAM Boot     Mobility  Bed Mobility               General bed mobility comments: NT, in recliner pre/post session    Transfers Overall transfer level: Needs assistance Equipment used:  (Standing at sink pulling up from sink bowl) Transfers: Sit to/from Stand Sit to Stand: Min assist           General transfer comment: Mod multi-modal cuing for proper sequencing with pt's R foot placed on top of this PT's foot to ensure WB compliance with CAM boot donned    Ambulation/Gait               General Gait Details: NT   Stairs             Wheelchair Mobility     Tilt Bed    Modified Rankin (Stroke Patients Only)       Balance Overall balance assessment: Needs assistance Sitting-balance support: No upper extremity supported, Feet supported Sitting balance-Leahy Scale: Good     Standing balance support: Bilateral upper extremity supported Standing balance-Leahy Scale: Fair                              Hotel manager: No apparent difficulties  Cognition Arousal: Alert Behavior During Therapy: WFL for tasks assessed/performed   PT - Cognitive impairments: No apparent impairments                           Following commands impaired: Only follows one step commands consistently    Cueing Cueing Techniques:  Verbal cues, Tactile cues, Visual cues  Exercises Total Joint Exercises Ankle Circles/Pumps: AROM, Strengthening, Left, 10 reps Quad Sets: Strengthening, Both, 10 reps Gluteal Sets: Strengthening, Both, 10 reps Straight Leg Raises: AAROM, Right, Strengthening, 5 reps Long Arc Quad: Strengthening, Both, 5 reps, 10 reps (with manual resistance) Knee Flexion: Strengthening, Both, 5 reps, 10 reps (with manual resistance) Other Exercises: LLE standing low amplitude mini-squats x 5 Other Exercises: HEP education with pt and family for LLE APs,  BLE QS, GS, LAQs x 10 each every 1-2 hours daily    General Comments        Pertinent Vitals/Pain Pain Assessment Pain Assessment: 0-10 Pain Score: 5  Pain Location: R foot/ankle Pain Descriptors / Indicators: Sore Pain Intervention(s): Monitored during session, Premedicated before session, Repositioned    Home Living                          Prior Function            PT Goals (current goals can now be found in the care plan section) Progress towards PT goals: Progressing toward goals    Frequency    Min 3X/week      PT Plan      Co-evaluation              AM-PAC PT 6 Clicks Mobility   Outcome Measure  Help needed turning from your back to your side while in a flat bed without using bedrails?: None Help needed moving from lying on your back to sitting on the side of a flat bed without using bedrails?: None Help needed moving to and from a bed to a chair (including a wheelchair)?: A Little Help needed standing up from a chair using your arms (e.g., wheelchair or bedside chair)?: A Little Help needed to walk in hospital room?: A Lot Help needed climbing 3-5 steps with a railing? : A Lot 6 Click Score: 18    End of Session Equipment Utilized During Treatment: Gait belt Activity Tolerance: Patient tolerated treatment well Patient left: in chair;with call bell/phone within reach;with chair alarm set;with family/visitor present Nurse Communication: Mobility status;Weight bearing status PT Visit Diagnosis: Muscle weakness (generalized) (M62.81);History of falling (Z91.81);Difficulty in walking, not elsewhere classified (R26.2)     Time: 8953-8886 PT Time Calculation (min) (ACUTE ONLY): 27 min  Charges:    $Therapeutic Exercise: 23-37 mins PT General Charges $$ ACUTE PT VISIT: 1 Visit                    D. Scott Ahmad Vanwey PT, DPT 01/17/24, 11:42 AM

## 2024-01-17 NOTE — Progress Notes (Signed)
 PROGRESS NOTE    [name]   FMW:[id number] DOB: May 29, 1935  DOA: 01/15/2024 Date of Service: 01/17/24 which is hospital day 2  PCP: Rudolpho Norleen BIRCH, MD    Hospital course / significant events:   HPI: [name] is a 89 year old female with history of htn, anxiety, leukopenia requiring transfusion (filgrastim ), who presents ED for chief concerns of syncope.  She reports that she does not know what happened yesterday.  While she noticed that she stood up and felt dizzy and then the next thing, she just was laying on the floor.  She reports this is never happened before.    08/19: to ED. VSS. CT head: No acute intracranial abnormality. Moderate cerebral white matter disease. X-ray does confirm mildly displaced and impacted R lateral malleolus fracture.  08/20: Echo no concerns. PT/OT recs for SNF given pt lives alone and NWB for now w/ fracture.  08/21: pending for SNF rehab      Consultants:  none  Procedures/Surgeries: none      ASSESSMENT & PLAN:   Syncope Suspect orthostatic in setting of patient standing up and experiencing syncope Complete echo ordered Fall precaution Reduced BP meds    Anxiety Home alprazolam  0.25 mg p.o. twice daily as needed for anxiety/sleep resumed PDMP reviewed   Closed fracture of lateral malleolus Mildly displaced, impacted fracture Symptomatic support: Oxycodone  5 mg p.o. every 6 hours.  For moderate pain, oxycodone  10 mg every 6 hours as needed for severe pain, 1 day ordered. fall precaution PT to see --> recs for SNF rehab   HTN (hypertension) Amlodipine  10 mg daily    Temporal arteritis (HCC) chronic prednisone  5 mg daily    Overweight based on BMI: Body mass index is 25.82 kg/m.SABRA Significantly low or high BMI is associated with higher medical risk.  Underweight - under 18  overweight - 25 to 29 obese - 30 or more Class 1 obesity: BMI of 30.0 to 34 Class 2 obesity: BMI of 35.0 to 39 Class 3  obesity: BMI of 40.0 to 49 Super Morbid Obesity: BMI 50-59 Super-super Morbid Obesity: BMI 60+ Healthy nutrition and physical activity advised as adjunct to other disease management and risk reduction treatments    DVT prophylaxis: heparin  IV fluids: no continuous IV fluids  Nutrition: cardiac Central lines / other devices: none  Code Status: FULL CODE ACP documentation reviewed:  none on file in [facility]  [facility] needs: SNF placement Medical barriers to dispo: none. Expected medical readiness for discharge once placement arranged.              Subjective / Brief ROS:  Patient reports feeling okay other than some ankle pain  Denies CP/SOB.  Denies new weakness.    Family Communication: family at bedside     Objective Findings:  Vitals:   01/17/24 0439 01/17/24 0500 01/17/24 0856 01/17/24 1316  BP: (!) 134/47  (!) 118/59 (!) 155/64  Pulse: 87  85 88  Resp: 18  (!) 21 20  Temp: 98.9 F (37.2 C)   98.2 F (36.8 C)  TempSrc:      SpO2: 95%  96% 98%  Weight:  67.7 kg    Height:        Intake/Output Summary (Last 24 hours) at 01/17/2024 1642 Last data filed at 01/17/2024 1406 Gross per 24 hour  Intake 240 ml  Output --  Net 240 ml   Filed Weights   01/15/24 1009 01/16/24 2100 01/17/24 0500  Weight: 72.6 kg 72.9  kg 67.7 kg    Examination:  Physical Exam Constitutional:      General: She is not in acute distress.    Appearance: She is not ill-appearing.  Cardiovascular:     Rate and Rhythm: Normal rate and regular rhythm.  Pulmonary:     Effort: Pulmonary effort is normal.     Breath sounds: Normal breath sounds.  Abdominal:     General: Abdomen is flat.     Palpations: Abdomen is soft.  Musculoskeletal:     Right lower leg: No edema.     Left lower leg: No edema.     Comments: NV intact distal LE  Skin:    General: Skin is warm and dry.  Neurological:     General: No focal deficit present.     Mental Status: She is alert and oriented to person,  place, and time.  Psychiatric:        Mood and Affect: Mood normal.        Behavior: Behavior normal.          Scheduled Medications:   vitamin C   1,000 mg Oral Daily   cholecalciferol   2,000 Units Oral Daily   enoxaparin  (LOVENOX ) injection  40 mg Subcutaneous Q24H   predniSONE   5 mg Oral Daily   sodium chloride  flush  3 mL Intravenous Q12H    Continuous Infusions:   PRN Medications:  acetaminophen  **OR** acetaminophen , ALPRAZolam , ondansetron  **OR** ondansetron  (ZOFRAN ) IV, oxyCODONE , oxyCODONE   Antimicrobials from admission:  Anti-infectives (From admission, onward)    None           Data Reviewed:  I have personally reviewed the following...  CBC: Recent Labs  Lab 01/15/24 1011 01/16/24 0618  WBC 3.2* 3.0*  HGB 13.6 13.4  HCT 42.6 41.9  MCV 91.6 91.1  PLT 229 220   Basic Metabolic Panel: Recent Labs  Lab 01/15/24 1011 01/16/24 0618  NA 142 142  K 4.0 3.7  CL 104 107  CO2 30 24  GLUCOSE 100* 103*  BUN 16 13  CREATININE 0.90 0.92  CALCIUM 9.1 8.9   GFR: Estimated Creatinine Clearance: 40.3 mL/min (by C-G formula based on SCr of 0.92 mg/dL). Liver Function Tests: Recent Labs  Lab 01/15/24 1011  AST 27  ALT 15  ALKPHOS 87  BILITOT 1.7*  PROT 7.0  ALBUMIN 4.0   No results for input(s): LIPASE, AMYLASE in the last 168 hours. No results for input(s): AMMONIA in the last 168 hours. Coagulation Profile: No results for input(s): INR, PROTIME in the last 168 hours. Cardiac Enzymes: No results for input(s): CKTOTAL, CKMB, CKMBINDEX, TROPONINI in the last 168 hours. BNP (last 3 results) No results for input(s): PROBNP in the last 8760 hours. HbA1C: No results for input(s): HGBA1C in the last 72 hours. CBG: Recent Labs  Lab 01/16/24 0624 01/17/24 0516  GLUCAP 105* 97   Lipid Profile: No results for input(s): CHOL, HDL, LDLCALC, TRIG, CHOLHDL, LDLDIRECT in the last 72 hours. Thyroid  Function  Tests: No results for input(s): TSH, T4TOTAL, FREET4, T3FREE, THYROIDAB in the last 72 hours. Anemia Panel: No results for input(s): VITAMINB12, FOLATE, FERRITIN, TIBC, IRON, RETICCTPCT in the last 72 hours. Most Recent Urinalysis On File:     Component Value Date/Time   COLORURINE STRAW (A) 01/15/2024 1236   APPEARANCEUR CLEAR (A) 01/15/2024 1236   APPEARANCEUR Clear 11/04/2014 0845   LABSPEC 1.005 01/15/2024 1236   LABSPEC 1.025 08/11/2012 1358   PHURINE 8.0 01/15/2024 1236   GLUCOSEU NEGATIVE  01/15/2024 1236   GLUCOSEU Negative 08/11/2012 1358   HGBUR NEGATIVE 01/15/2024 1236   BILIRUBINUR NEGATIVE 01/15/2024 1236   BILIRUBINUR Negative 11/04/2014 0845   BILIRUBINUR Negative 08/11/2012 1358   KETONESUR NEGATIVE 01/15/2024 1236   PROTEINUR NEGATIVE 01/15/2024 1236   UROBILINOGEN 1.0 08/30/2007 2131   NITRITE NEGATIVE 01/15/2024 1236   LEUKOCYTESUR NEGATIVE 01/15/2024 1236   LEUKOCYTESUR Negative 08/11/2012 1358   Sepsis Labs: @LABRCNTIP (procalcitonin:4,lacticidven:4) Microbiology: No results found for this or any previous visit (from the past 240 hours).    Radiology Studies last 3 days: ECHOCARDIOGRAM COMPLETE Result Date: 01/16/2024    ECHOCARDIOGRAM REPORT   Patient Name:   [name] [facility] Date of Exam: 01/15/2024 Medical Rec #:  [id number]           Height:       66.0 in Accession #:    [id number]          Weight:       160.0 lb Date of Birth:  [birth year]           BSA:          1.819 m Patient Age:    89 years            BP:           150/52 mmHg Patient Gender: F                   HR:           71 bpm. Exam Location:  [facility] Procedure: 2D Echo, Cardiac Doppler and Color Doppler (Both Spectral and Color            Flow Doppler were utilized during procedure). Indications:     Dyspnea R06.00                  Syncope R55  History:         Patient has no prior history of Echocardiogram examinations.                  Risk Factors:Hypertension and  Dyslipidemia.  Sonographer:     Christopher Furnace Referring Phys:  [id number] AMY N COX Diagnosing Phys: Cara JONETTA Lovelace MD  Sonographer Comments: Image acquisition challenging due to breast implants. IMPRESSIONS  1. Left ventricular ejection fraction, by estimation, is 55 to 60%. The left ventricle has normal function. The left ventricle has no regional wall motion abnormalities. There is mild asymmetric left ventricular hypertrophy of the basal segment. Left ventricular diastolic parameters are consistent with Grade I diastolic dysfunction (impaired relaxation).  2. Right ventricular systolic function is normal. The right ventricular size is normal.  3. The mitral valve is grossly normal. Mild mitral valve regurgitation.  4. The aortic valve is normal in structure. Aortic valve regurgitation is mild. Aortic valve sclerosis is present, with no evidence of aortic valve stenosis. Conclusion(s)/Recommendation(s): Poor windows for evaluation of left ventricular function by transthoracic echocardiography. Would recommend an alternative means of evaluation. FINDINGS  Left Ventricle: Left ventricular ejection fraction, by estimation, is 55 to 60%. The left ventricle has normal function. The left ventricle has no regional wall motion abnormalities. Strain was performed and the global longitudinal strain is indeterminate. The left ventricular internal cavity size was normal in size. There is mild asymmetric left ventricular hypertrophy of the basal segment. Left ventricular diastolic parameters are consistent with Grade I diastolic dysfunction (impaired relaxation). Right Ventricle: The right ventricular size is normal. No increase in right ventricular wall thickness.  Right ventricular systolic function is normal. Left Atrium: Left atrial size was normal in size. Right Atrium: Right atrial size was normal in size. Pericardium: There is no evidence of pericardial effusion. Mitral Valve: The mitral valve is grossly normal. There is  mild thickening of the mitral valve leaflet(s). There is mild calcification of the mitral valve leaflet(s). Mild mitral valve regurgitation. MV peak gradient, 16.0 mmHg. The mean mitral valve gradient is 8.0 mmHg. Tricuspid Valve: The tricuspid valve is normal in structure. Tricuspid valve regurgitation is mild. Aortic Valve: The aortic valve is normal in structure. Aortic valve regurgitation is mild. Aortic valve sclerosis is present, with no evidence of aortic valve stenosis. Aortic valve mean gradient measures 4.5 mmHg. Aortic valve peak gradient measures 7.2  mmHg. Aortic valve area, by VTI measures 3.30 cm. Pulmonic Valve: The pulmonic valve was normal in structure. Pulmonic valve regurgitation is not visualized. Aorta: The ascending aorta was not well visualized. IAS/Shunts: No atrial level shunt detected by color flow Doppler. Additional Comments: 3D was performed not requiring image post processing on an independent workstation and was indeterminate.  LEFT VENTRICLE PLAX 2D LVIDd:         3.20 cm   Diastology LVIDs:         2.30 cm   LV e' medial:    7.94 cm/s LV PW:         1.30 cm   LV E/e' medial:  15.9 LV IVS:        1.10 cm   LV e' lateral:   5.87 cm/s LVOT diam:     2.00 cm   LV E/e' lateral: 21.5 LV SV:         91 LV SV Index:   50 LVOT Area:     3.14 cm  RIGHT VENTRICLE RV Basal diam:  2.70 cm RV Mid diam:    2.40 cm RV S prime:     15.00 cm/s TAPSE (M-mode): 1.7 cm LEFT ATRIUM             Index        RIGHT ATRIUM           Index LA diam:        2.90 cm 1.59 cm/m   RA Area:     11.80 cm LA Vol (A2C):   55.0 ml 30.24 ml/m  RA Volume:   25.30 ml  13.91 ml/m LA Vol (A4C):   30.5 ml 16.77 ml/m LA Biplane Vol: 45.7 ml 25.13 ml/m  AORTIC VALVE AV Area (Vmax):    2.84 cm AV Area (Vmean):   2.77 cm AV Area (VTI):     3.30 cm AV Vmax:           134.00 cm/s AV Vmean:          97.200 cm/s AV VTI:            0.275 m AV Peak Grad:      7.2 mmHg AV Mean Grad:      4.5 mmHg LVOT Vmax:         121.00  cm/s LVOT Vmean:        85.600 cm/s LVOT VTI:          0.289 m LVOT/AV VTI ratio: 1.05  AORTA Ao Root diam: 3.00 cm MITRAL VALVE                TRICUSPID VALVE MV Area (PHT): 1.97 cm     TR Peak grad:  16.2 mmHg MV Area VTI:   1.83 cm     TR Vmax:        201.00 cm/s MV Peak grad:  16.0 mmHg MV Mean grad:  8.0 mmHg     SHUNTS MV Vmax:       2.00 m/s     Systemic VTI:  0.29 m MV Vmean:      132.0 cm/s   Systemic Diam: 2.00 cm MV Decel Time: 385 msec MV E velocity: 126.00 cm/s MV A velocity: 164.00 cm/s MV E/A ratio:  0.77 Cara JONETTA Lovelace MD Electronically signed by Cara JONETTA Lovelace MD Signature Date/Time: 01/16/2024/7:58:04 AM    Final    Portable Chest 1 View Result Date: 01/15/2024 CLINICAL DATA:  Syncope. Loss of consciousness with resulting right ankle injury. EXAM: PORTABLE CHEST 1 VIEW COMPARISON:  Radiographs 04/11/2023 and 09/21/2017.  CT 09/21/2017. FINDINGS: 1315 hours. The heart size and mediastinal contours are stable with mild aortic atherosclerosis. There is stable mild scarring at both lung apices. The lungs are otherwise clear. No pleural effusion or pneumothorax. No acute osseous findings are evident. Bilateral breast implants are noted. IMPRESSION: No evidence of acute cardiopulmonary process. Aortic atherosclerosis. Electronically Signed   By: Elsie Perone M.D.   On: 01/15/2024 13:36   CT Cervical Spine Wo Contrast Result Date: 01/15/2024 CLINICAL DATA:  Neck trauma.  Syncopal episode yesterday with fall. EXAM: CT CERVICAL SPINE WITHOUT CONTRAST TECHNIQUE: Multidetector CT imaging of the cervical spine was performed without intravenous contrast. Multiplanar CT image reconstructions were also generated. RADIATION DOSE REDUCTION: This exam was performed according to the departmental dose-optimization program which includes automated exposure control, adjustment of the mA and/or kV according to patient size and/or use of iterative reconstruction technique. COMPARISON:  None Available.  FINDINGS: Alignment: Normal. Skull base and vertebrae: No evidence of acute fracture or traumatic subluxation. Soft tissues and spinal canal: No prevertebral fluid or swelling. No visible canal hematoma. Disc levels: Mild-to-moderate multilevel spondylosis for age with disc space narrowing, endplate osteophytes, uncinate spurring and facet hypertrophy. No large disc herniation identified. Up to moderate osseous foraminal narrowing which appears worst at C5-6 and C6-7. Upper chest: Mild biapical scarring. Aortic and carotid atherosclerosis. Other: Bilateral TMJ degenerative changes. IMPRESSION: 1. No evidence of acute cervical spine fracture, traumatic subluxation or static signs of instability. 2. Mild-to-moderate multilevel cervical spondylosis for age. 3.  Aortic Atherosclerosis (ICD10-I70.0). Electronically Signed   By: Elsie Perone M.D.   On: 01/15/2024 11:23   CT HEAD WO CONTRAST ( ) Result Date: 01/15/2024 EXAM: CT HEAD WITHOUT CONTRAST 01/15/2024 11:09:07 AM TECHNIQUE: CT of the head was performed without the administration of intravenous contrast. Automated exposure control, iterative reconstruction, and/or weight based adjustment of the mA/kV was utilized to reduce the radiation dose to as low as reasonably achievable. COMPARISON: CT of the head dated 09/21/2017. CLINICAL HISTORY: Head trauma, minor (Age >= 65y). Pt to ED from with family member for R ankle injury from syncopal episode yesterday at 5pm when she was getting up out of a chair. Both lower legs have bronze discoloration, but R lower leg is red and swollen. Painful to bear weight. Also daughter in law mentions some decline in cognitive function and mental status since last few months. Pt is alert and oriented. FINDINGS: BRAIN AND VENTRICLES: No acute hemorrhage. No mass. No acute cortical infarct. No hydrocephalus. Moderate cerebral white matter disease. ORBITS: Patient is status post bilateral lens surgery. The patient is status post  bilateral lens replacement. SINUSES:  Mild mucosal disease within the right ethmoid air cells and the sphenoid sinus. SOFT TISSUES AND SKULL: No acute soft tissue abnormality. No skull fracture. IMPRESSION: 1. No acute intracranial abnormality. 2. Moderate cerebral white matter disease. Electronically signed by: Evalene Coho MD 01/15/2024 11:21 AM EDT RP Workstation: [id number]   DG Tibia/Fibula Right Result Date: 01/15/2024 CLINICAL DATA:  fall.  Pain in the ankle and foot. EXAM: RIGHT FOOT COMPLETE - 3+ VIEW; RIGHT TIBIA AND FIBULA - 2 VIEW COMPARISON:  None Available. FINDINGS: There is mildly displaced and impacted fracture of the lateral malleolus with mild-to-moderate overlying soft tissue swelling. No other acute fracture or dislocation. No aggressive osseous lesion. Ankle mortise appears intact. Mild degenerative changes of the imaged joints. No focal soft tissue swelling. No radiopaque foreign bodies. IMPRESSION: *Mildly displaced and impacted fracture of the lateral malleolus. Electronically Signed   By: Ree Molt M.D.   On: 01/15/2024 10:44   DG Foot Complete Right Result Date: 01/15/2024 CLINICAL DATA:  fall.  Pain in the ankle and foot. EXAM: RIGHT FOOT COMPLETE - 3+ VIEW; RIGHT TIBIA AND FIBULA - 2 VIEW COMPARISON:  None Available. FINDINGS: There is mildly displaced and impacted fracture of the lateral malleolus with mild-to-moderate overlying soft tissue swelling. No other acute fracture or dislocation. No aggressive osseous lesion. Ankle mortise appears intact. Mild degenerative changes of the imaged joints. No focal soft tissue swelling. No radiopaque foreign bodies. IMPRESSION: *Mildly displaced and impacted fracture of the lateral malleolus. Electronically Signed   By: Ree Molt M.D.   On: 01/15/2024 10:44          Laneta Blunt, DO [facility] 01/17/2024, 4:42 PM    Dictation software may have been used to generate the above note. Typos may occur and  escape review in typed/dictated notes. Please contact Dr Blunt directly for clarity if needed.  Staff may message me via secure chat in Epic  but this may not receive an immediate response,  please page me for urgent matters!  If 7PM-7AM, please contact night coverage [email or web address]

## 2024-01-18 ENCOUNTER — Encounter: Payer: Self-pay | Admitting: Internal Medicine

## 2024-01-18 ENCOUNTER — Telehealth: Payer: Self-pay | Admitting: Internal Medicine

## 2024-01-18 DIAGNOSIS — R55 Syncope and collapse: Secondary | ICD-10-CM | POA: Diagnosis not present

## 2024-01-18 MED ORDER — POLYETHYLENE GLYCOL 3350 17 G PO PACK
17.0000 g | PACK | Freq: Every day | ORAL | Status: DC
  Administered 2024-01-18 – 2024-01-21 (×4): 17 g via ORAL
  Filled 2024-01-18 (×4): qty 1

## 2024-01-18 MED ORDER — TRAMADOL HCL 50 MG PO TABS
50.0000 mg | ORAL_TABLET | Freq: Four times a day (QID) | ORAL | Status: DC | PRN
  Administered 2024-01-18 – 2024-01-22 (×8): 50 mg via ORAL
  Filled 2024-01-18 (×9): qty 1

## 2024-01-18 NOTE — Plan of Care (Signed)
  Problem: Cardiac: Goal: Will achieve and/or maintain adequate cardiac output 01/18/2024 0517 by Melven Odilia PARAS, RN Outcome: Progressing 01/18/2024 0421 by Melven Odilia PARAS, RN Outcome: Progressing 01/18/2024 0420 by Melven Odilia PARAS, RN Outcome: Progressing   Problem: Coping: Goal: Level of anxiety will decrease 01/18/2024 0517 by Melven Odilia PARAS, RN Outcome: Progressing 01/18/2024 0421 by Melven Odilia PARAS, RN Outcome: Progressing 01/18/2024 0420 by Melven Odilia PARAS, RN Outcome: Progressing   Problem: Elimination: Goal: Will not experience complications related to bowel motility 01/18/2024 0517 by Melven Odilia PARAS, RN Outcome: Progressing 01/18/2024 0421 by Melven Odilia PARAS, RN Outcome: Progressing 01/18/2024 0420 by Melven Odilia PARAS, RN Outcome: Progressing   Problem: Safety: Goal: Ability to remain free from injury will improve 01/18/2024 0517 by Melven Odilia PARAS, RN Outcome: Progressing 01/18/2024 0421 by Melven Odilia PARAS, RN Outcome: Progressing 01/18/2024 0420 by Melven Odilia PARAS, RN Outcome: Progressing

## 2024-01-18 NOTE — Progress Notes (Signed)
 Physical Therapy Treatment Patient Details Name: [name] MRN: [id number] DOB: 02-11-1935 Today's Date: 01/18/2024   History of Present Illness [name] is an 89 year old female with history of HTN, anxiety, leukopenia requiring transfusion (filgrastim ), who presents ED for chief concerns of syncope, mechanical fall, and R ankle pain. Imaging shows R mildly displaced and impacted fracture of the lateral malleolus.    PT Comments  Pt was pleasant and motivated to participate during the session and put forth good effort throughout. Pt required BUE assist to get her RLE out of bed but no assist from this PT.  Once in sitting pt presented with good static sitting balance.  Pt able to come to standing from a minimally elevated EOB with mod multi-modal cuing for proper sequencing and with pt's R foot held out anteriorly to ensure WB compliance with CAM boot donned.  Pt presented with heavy lean on the RW for support in standing with pt able to mostly shuffle her L foot at the EOB and from bed to chair with frequent verbal cues to keep R foot off of the floor and for general sequencing with the RW; good WB compliance throughout the session.  Pt reported no adverse symptoms during the session and was left in chair with needs in place and with family present. Pt will benefit from continued PT services upon discharge to safely address deficits listed in patient problem list for decreased caregiver assistance and eventual return to PLOF.       If plan is discharge home, recommend the following: A lot of help with walking and/or transfers;A lot of help with bathing/dressing/bathroom   Can travel by private vehicle     No  Equipment Recommendations  Rolling walker (2 wheels);BSC/3in1;Wheelchair cushion (measurements PT);Wheelchair (measurements PT);Other (comment) (TBD at next venue of care)    Recommendations for Other Services       Precautions / Restrictions  Precautions Precautions: Fall Recall of Precautions/Restrictions: Impaired Required Braces or Orthoses: Other Brace Other Brace: CAM boot Restrictions Weight Bearing Restrictions Per Provider Order: Yes RLE Weight Bearing Per Provider Order: Non weight bearing Other Position/Activity Restrictions: CAM Boot to RLE     Mobility  Bed Mobility Overal bed mobility: Modified Independent             General bed mobility comments: Min extra time and effort with use of BUEs to assist RLE out of bed    Transfers Overall transfer level: Needs assistance Equipment used: Rolling walker (2 wheels) Transfers: Sit to/from Stand Sit to Stand: Min assist, From elevated surface           General transfer comment: Mod multi-modal cuing for proper sequencing with pt's R foot held out anteriorly to ensure WB compliance with CAM boot donned    Ambulation/Gait Ambulation/Gait assistance: Min assist Gait Distance (Feet): 3 Feet Assistive device: Rolling walker (2 wheels) Gait Pattern/deviations: Shuffle Gait velocity: decreased     General Gait Details: Heavy lean on the RW for support with pt able to mostly shuffle her L foot at the EOB and from bed to chair with frequent verbal cues to keep R foot off of the floor and for general sequencing with the RW; good WB compliance throughout the session   Stairs             Wheelchair Mobility     Tilt Bed    Modified Rankin (Stroke Patients Only)       Balance Overall balance assessment: Needs assistance Sitting-balance support:  No upper extremity supported, Feet unsupported Sitting balance-Leahy Scale: Normal     Standing balance support: Bilateral upper extremity supported, During functional activity, Reliant on assistive device for balance Standing balance-Leahy Scale: Fair                              Hotel manager: No apparent difficulties  Cognition Arousal: Alert Behavior  During Therapy: WFL for tasks assessed/performed   PT - Cognitive impairments: No apparent impairments                         Following commands: Impaired Following commands impaired: Follows one step commands with increased time    Cueing Cueing Techniques: Verbal cues, Tactile cues, Visual cues  Exercises Total Joint Exercises Ankle Circles/Pumps: AROM, Strengthening, Left, 10 reps Quad Sets: Strengthening, Both, 10 reps Gluteal Sets: Strengthening, Both, 10 reps Towel Squeeze: Strengthening, Both, 10 reps Short Arc Quad: Strengthening, Both, 10 reps Hip ABduction/ADduction: Strengthening, Both, 10 reps, AAROM (AAROM on the RLE) Straight Leg Raises: Strengthening, Both, 10 reps Long Arc Quad: Strengthening, Both, 10 reps Other Exercises Other Exercises: HEP education provided per handout    General Comments        Pertinent Vitals/Pain Pain Assessment Pain Assessment: No/denies pain    Home Living                          Prior Function            PT Goals (current goals can now be found in the care plan section) Progress towards PT goals: Progressing toward goals    Frequency    Min 3X/week      PT Plan      Co-evaluation              AM-PAC PT 6 Clicks Mobility   Outcome Measure  Help needed turning from your back to your side while in a flat bed without using bedrails?: None Help needed moving from lying on your back to sitting on the side of a flat bed without using bedrails?: None Help needed moving to and from a bed to a chair (including a wheelchair)?: A Little Help needed standing up from a chair using your arms (e.g., wheelchair or bedside chair)?: A Little Help needed to walk in hospital room?: A Lot Help needed climbing 3-5 steps with a railing? : A Lot 6 Click Score: 18    End of Session Equipment Utilized During Treatment: Gait belt Activity Tolerance: Patient tolerated treatment well Patient left: in  chair;with call bell/phone within reach;with chair alarm set;with family/visitor present Nurse Communication: Mobility status;Weight bearing status PT Visit Diagnosis: Muscle weakness (generalized) (M62.81);History of falling (Z91.81);Difficulty in walking, not elsewhere classified (R26.2)     Time: 8486-8452 PT Time Calculation (min) (ACUTE ONLY): 34 min  Charges:    $Therapeutic Exercise: 23-37 mins PT General Charges $$ ACUTE PT VISIT: 1 Visit                    D. Glendia Bertin PT, DPT 01/18/24, 4:50 PM

## 2024-01-18 NOTE — Plan of Care (Signed)
  Problem: Clinical Measurements: Goal: Ability to maintain clinical measurements within normal limits will improve 01/18/2024 0421 by Melven Odilia PARAS, RN Outcome: Progressing 01/18/2024 0420 by Melven Odilia PARAS, RN Outcome: Progressing   Problem: Coping: Goal: Level of anxiety will decrease 01/18/2024 0421 by Melven Odilia PARAS, RN Outcome: Progressing 01/18/2024 0420 by Melven Odilia PARAS, RN Outcome: Progressing   Problem: Pain Managment: Goal: General experience of comfort will improve and/or be controlled 01/18/2024 0421 by Melven Odilia PARAS, RN Outcome: Progressing 01/18/2024 0420 by Melven Odilia PARAS, RN Outcome: Progressing   Problem: Safety: Goal: Ability to remain free from injury will improve 01/18/2024 0421 by Melven Odilia PARAS, RN Outcome: Progressing 01/18/2024 0420 by Melven Odilia PARAS, RN Outcome: Progressing

## 2024-01-18 NOTE — Plan of Care (Deleted)

## 2024-01-18 NOTE — Plan of Care (Signed)
  Problem: Education: Goal: Knowledge of condition and prescribed therapy will improve Outcome: Progressing   Problem: Education: Goal: Knowledge of General Education information will improve Description: Including pain rating scale, medication(s)/side effects and non-pharmacologic comfort measures Outcome: Progressing   Problem: Clinical Measurements: Goal: Ability to maintain clinical measurements within normal limits will improve Outcome: Progressing Goal: Will remain free from infection Outcome: Progressing Goal: Diagnostic test results will improve Outcome: Progressing Goal: Respiratory complications will improve Outcome: Progressing Goal: Cardiovascular complication will be avoided Outcome: Progressing   Problem: Activity: Goal: Risk for activity intolerance will decrease Outcome: Progressing   Problem: Pain Managment: Goal: General experience of comfort will improve and/or be controlled Outcome: Progressing   Problem: Safety: Goal: Ability to remain free from injury will improve Outcome: Progressing

## 2024-01-18 NOTE — Telephone Encounter (Signed)
 Pt daughter called and stated pt is in hospital and then will go to a rehab facility for a while. Appts on 8/26 have been canceled per pt daughter.

## 2024-01-18 NOTE — TOC Progression Note (Addendum)
 Transition of Care [facility]) - Progression Note    Patient Details  Name: [name] MRN: [id number] Date of Birth: [birth year]  [facility] CM/SW Contact  [name] Fuse, RN Phone Number: 01/18/2024, 11:21 AM  Clinical Narrative:    Remains in house awaiting placement. Admissions to [facility] on hold due to COVID. Per handoff the daughter is open to potentially taking the patient Home with [facility] PT/OT, wheel chair, and 3 in 1 BSC if no bed available. TOC received a message from the patient's daughter [name] TOC returned the call and lvmm advising admissions at [facility] remain on hold. The hope is that beds will be available next week.                    Expected Discharge Plan and Services                                               Social Drivers of Health (SDOH) Interventions SDOH Screenings   Food Insecurity: No Food Insecurity (01/16/2024)  Housing: Low Risk  (01/16/2024)  Transportation Needs: No Transportation Needs (01/16/2024)  Utilities: Not At Risk (01/16/2024)  Social Connections: Socially Isolated (01/16/2024)  Tobacco Use: Medium Risk (01/15/2024)    Readmission Risk Interventions     No data to display

## 2024-01-18 NOTE — Progress Notes (Signed)
 PROGRESS NOTE    [name]   FMW:[id number] DOB: 05-20-1935  DOA: 01/15/2024 Date of Service: 01/18/24 which is hospital day 3  PCP: Rudolpho Norleen BIRCH, MD    Hospital course / significant events:   HPI: [name] is a 89 year old female with history of htn, anxiety, leukopenia requiring transfusion (filgrastim ), who presents ED for chief concerns of syncope.  She reports that she does not know what happened yesterday.  While she noticed that she stood up and felt dizzy and then the next thing, she just was laying on the floor.  She reports this is never happened before.    08/19: to ED. VSS. CT head: No acute intracranial abnormality. Moderate cerebral white matter disease. X-ray does confirm mildly displaced and impacted R lateral malleolus fracture.  08/20: Echo no concerns. PT/OT recs for SNF given pt lives alone and NWB for now w/ fracture.  08/21-08/22: pending for SNF rehab      Consultants:  none  Procedures/Surgeries: none      ASSESSMENT & PLAN:   Syncope Suspect orthostatic in setting of patient standing up and experiencing syncope Complete echo ordered - no concerns Fall precaution Reduced BP meds    Anxiety Home alprazolam  0.25 mg p.o. twice daily as needed for anxiety/sleep resumed PDMP reviewed   Closed fracture of lateral malleolus Mildly displaced, impacted fracture Symptomatic support: Oxycodone  5 mg p.o. every 6 hours.  For moderate pain, oxycodone  10 mg every 6 hours as needed for severe pain, 1 day ordered. fall precaution PT recs for SNF rehab   HTN (hypertension) Amlodipine  10 mg daily dc given orthostatic hypotension and fall/syncope   Temporal arteritis (HCC) chronic prednisone  5 mg daily    Overweight based on BMI: Body mass index is 25.82 kg/m.SABRA Significantly low or high BMI is associated with higher medical risk.  Underweight - under 18  overweight - 25 to 29 obese - 30 or more Class 1 obesity: BMI of 30.0  to 34 Class 2 obesity: BMI of 35.0 to 39 Class 3 obesity: BMI of 40.0 to 49 Super Morbid Obesity: BMI 50-59 Super-super Morbid Obesity: BMI 60+ Healthy nutrition and physical activity advised as adjunct to other disease management and risk reduction treatments    DVT prophylaxis: lovenox  IV fluids: no continuous IV fluids  Nutrition: cardiac Central lines / other devices: none  Code Status: FULL CODE ACP documentation reviewed:  none on file in [facility]  [facility] needs: SNF placement Medical barriers to dispo: none. Expected medical readiness for discharge once placement arranged.              Subjective / Brief ROS:  No concerns - pt is asleep and family is present, some pain in ankle but it's fairly well controlled    Family Communication: family at bedside     Objective Findings:  Vitals:   01/17/24 1316 01/17/24 1845 01/18/24 0336 01/18/24 0818  BP: (!) 155/64 (!) 168/57 (!) 135/41 (!) 128/46  Pulse: 88 82 79 79  Resp: 20 17 18 18   Temp: 98.2 F (36.8 C) 98.2 F (36.8 C) 97.7 F (36.5 C) 98.3 F (36.8 C)  TempSrc:  Oral Oral   SpO2: 98% 100% 96% 96%  Weight:      Height:        Intake/Output Summary (Last 24 hours) at 01/18/2024 1458 Last data filed at 01/18/2024 1300 Gross per 24 hour  Intake 420 ml  Output --  Net 420 ml   [employer]  01/15/24 1009 01/16/24 2100 01/17/24 0500  Weight: 72.6 kg 72.9 kg 67.7 kg    Examination:  Physical Exam Constitutional:      General: She is not in acute distress.    Appearance: She is not ill-appearing.  Pulmonary:     Effort: Pulmonary effort is normal.  Musculoskeletal:     Comments: NV intact distal LE          Scheduled Medications:   vitamin C   1,000 mg Oral Daily   cholecalciferol   2,000 Units Oral Daily   enoxaparin  (LOVENOX ) injection  40 mg Subcutaneous Q24H   polyethylene glycol  17 g Oral Daily   predniSONE   5 mg Oral Daily   sodium chloride  flush  3 mL Intravenous Q12H     Continuous Infusions:   PRN Medications:  acetaminophen  **OR** acetaminophen , ALPRAZolam , ondansetron  **OR** ondansetron  (ZOFRAN ) IV, oxyCODONE , oxyCODONE   Antimicrobials from admission:  Anti-infectives (From admission, onward)    None           Data Reviewed:  I have personally reviewed the following...  CBC: Recent Labs  Lab 01/15/24 1011 01/16/24 0618  WBC 3.2* 3.0*  HGB 13.6 13.4  HCT 42.6 41.9  MCV 91.6 91.1  PLT 229 220   Basic Metabolic Panel: Recent Labs  Lab 01/15/24 1011 01/16/24 0618  NA 142 142  K 4.0 3.7  CL 104 107  CO2 30 24  GLUCOSE 100* 103*  BUN 16 13  CREATININE 0.90 0.92  CALCIUM 9.1 8.9   GFR: Estimated Creatinine Clearance: 40.3 mL/min (by C-G formula based on SCr of 0.92 mg/dL). Liver Function Tests: Recent Labs  Lab 01/15/24 1011  AST 27  ALT 15  ALKPHOS 87  BILITOT 1.7*  PROT 7.0  ALBUMIN 4.0   No results for input(s): LIPASE, AMYLASE in the last 168 hours. No results for input(s): AMMONIA in the last 168 hours. Coagulation Profile: No results for input(s): INR, PROTIME in the last 168 hours. Cardiac Enzymes: No results for input(s): CKTOTAL, CKMB, CKMBINDEX, TROPONINI in the last 168 hours. BNP (last 3 results) No results for input(s): PROBNP in the last 8760 hours. HbA1C: No results for input(s): HGBA1C in the last 72 hours. CBG: Recent Labs  Lab 01/16/24 0624 01/17/24 0516  GLUCAP 105* 97   Lipid Profile: No results for input(s): CHOL, HDL, LDLCALC, TRIG, CHOLHDL, LDLDIRECT in the last 72 hours. Thyroid  Function Tests: No results for input(s): TSH, T4TOTAL, FREET4, T3FREE, THYROIDAB in the last 72 hours. Anemia Panel: No results for input(s): VITAMINB12, FOLATE, FERRITIN, TIBC, IRON, RETICCTPCT in the last 72 hours. Most Recent Urinalysis On File:     Component Value Date/Time   COLORURINE STRAW (A) 01/15/2024 1236   APPEARANCEUR CLEAR (A)  01/15/2024 1236   APPEARANCEUR Clear 11/04/2014 0845   LABSPEC 1.005 01/15/2024 1236   LABSPEC 1.025 08/11/2012 1358   PHURINE 8.0 01/15/2024 1236   GLUCOSEU NEGATIVE 01/15/2024 1236   GLUCOSEU Negative 08/11/2012 1358   HGBUR NEGATIVE 01/15/2024 1236   BILIRUBINUR NEGATIVE 01/15/2024 1236   BILIRUBINUR Negative 11/04/2014 0845   BILIRUBINUR Negative 08/11/2012 1358   KETONESUR NEGATIVE 01/15/2024 1236   PROTEINUR NEGATIVE 01/15/2024 1236   UROBILINOGEN 1.0 08/30/2007 2131   NITRITE NEGATIVE 01/15/2024 1236   LEUKOCYTESUR NEGATIVE 01/15/2024 1236   LEUKOCYTESUR Negative 08/11/2012 1358   Sepsis Labs: @LABRCNTIP (procalcitonin:4,lacticidven:4) Microbiology: No results found for this or any previous visit (from the past 240 hours).    Radiology Studies last 3 days: ECHOCARDIOGRAM COMPLETE Result Date: 01/16/2024  ECHOCARDIOGRAM REPORT   Patient Name:   HIILEI GERST Date of Exam: 01/15/2024 Medical Rec #:  [id number]           Height:       66.0 in Accession #:    [id number]          Weight:       160.0 lb Date of Birth:  [birth year]           BSA:          1.819 m Patient Age:    89 years            BP:           150/52 mmHg Patient Gender: F                   HR:           71 bpm. Exam Location:  [facility] Procedure: 2D Echo, Cardiac Doppler and Color Doppler (Both Spectral and Color            Flow Doppler were utilized during procedure). Indications:     Dyspnea R06.00                  Syncope R55  History:         Patient has no prior history of Echocardiogram examinations.                  Risk Factors:Hypertension and Dyslipidemia.  Sonographer:     Christopher Furnace Referring Phys:  [id number] AMY N COX Diagnosing Phys: Cara JONETTA Lovelace MD  Sonographer Comments: Image acquisition challenging due to breast implants. IMPRESSIONS  1. Left ventricular ejection fraction, by estimation, is 55 to 60%. The left ventricle has normal function. The left ventricle has no regional wall motion abnormalities.  There is mild asymmetric left ventricular hypertrophy of the basal segment. Left ventricular diastolic parameters are consistent with Grade I diastolic dysfunction (impaired relaxation).  2. Right ventricular systolic function is normal. The right ventricular size is normal.  3. The mitral valve is grossly normal. Mild mitral valve regurgitation.  4. The aortic valve is normal in structure. Aortic valve regurgitation is mild. Aortic valve sclerosis is present, with no evidence of aortic valve stenosis. Conclusion(s)/Recommendation(s): Poor windows for evaluation of left ventricular function by transthoracic echocardiography. Would recommend an alternative means of evaluation. FINDINGS  Left Ventricle: Left ventricular ejection fraction, by estimation, is 55 to 60%. The left ventricle has normal function. The left ventricle has no regional wall motion abnormalities. Strain was performed and the global longitudinal strain is indeterminate. The left ventricular internal cavity size was normal in size. There is mild asymmetric left ventricular hypertrophy of the basal segment. Left ventricular diastolic parameters are consistent with Grade I diastolic dysfunction (impaired relaxation). Right Ventricle: The right ventricular size is normal. No increase in right ventricular wall thickness. Right ventricular systolic function is normal. Left Atrium: Left atrial size was normal in size. Right Atrium: Right atrial size was normal in size. Pericardium: There is no evidence of pericardial effusion. Mitral Valve: The mitral valve is grossly normal. There is mild thickening of the mitral valve leaflet(s). There is mild calcification of the mitral valve leaflet(s). Mild mitral valve regurgitation. MV peak gradient, 16.0 mmHg. The mean mitral valve gradient is 8.0 mmHg. Tricuspid Valve: The tricuspid valve is normal in structure. Tricuspid valve regurgitation is mild. Aortic Valve: The aortic valve is normal in structure. Aortic  valve regurgitation is mild.  Aortic valve sclerosis is present, with no evidence of aortic valve stenosis. Aortic valve mean gradient measures 4.5 mmHg. Aortic valve peak gradient measures 7.2  mmHg. Aortic valve area, by VTI measures 3.30 cm. Pulmonic Valve: The pulmonic valve was normal in structure. Pulmonic valve regurgitation is not visualized. Aorta: The ascending aorta was not well visualized. IAS/Shunts: No atrial level shunt detected by color flow Doppler. Additional Comments: 3D was performed not requiring image post processing on an independent workstation and was indeterminate.  LEFT VENTRICLE PLAX 2D LVIDd:         3.20 cm   Diastology LVIDs:         2.30 cm   LV e' medial:    7.94 cm/s LV PW:         1.30 cm   LV E/e' medial:  15.9 LV IVS:        1.10 cm   LV e' lateral:   5.87 cm/s LVOT diam:     2.00 cm   LV E/e' lateral: 21.5 LV SV:         91 LV SV Index:   50 LVOT Area:     3.14 cm  RIGHT VENTRICLE RV Basal diam:  2.70 cm RV Mid diam:    2.40 cm RV S prime:     15.00 cm/s TAPSE (M-mode): 1.7 cm LEFT ATRIUM             Index        RIGHT ATRIUM           Index LA diam:        2.90 cm 1.59 cm/m   RA Area:     11.80 cm LA Vol (A2C):   55.0 ml 30.24 ml/m  RA Volume:   25.30 ml  13.91 ml/m LA Vol (A4C):   30.5 ml 16.77 ml/m LA Biplane Vol: 45.7 ml 25.13 ml/m  AORTIC VALVE AV Area (Vmax):    2.84 cm AV Area (Vmean):   2.77 cm AV Area (VTI):     3.30 cm AV Vmax:           134.00 cm/s AV Vmean:          97.200 cm/s AV VTI:            0.275 m AV Peak Grad:      7.2 mmHg AV Mean Grad:      4.5 mmHg LVOT Vmax:         121.00 cm/s LVOT Vmean:        85.600 cm/s LVOT VTI:          0.289 m LVOT/AV VTI ratio: 1.05  AORTA Ao Root diam: 3.00 cm MITRAL VALVE                TRICUSPID VALVE MV Area (PHT): 1.97 cm     TR Peak grad:   16.2 mmHg MV Area VTI:   1.83 cm     TR Vmax:        201.00 cm/s MV Peak grad:  16.0 mmHg MV Mean grad:  8.0 mmHg     SHUNTS MV Vmax:       2.00 m/s     Systemic VTI:  0.29 m  MV Vmean:      132.0 cm/s   Systemic Diam: 2.00 cm MV Decel Time: 385 msec MV E velocity: 126.00 cm/s MV A velocity: 164.00 cm/s MV E/A ratio:  0.77 Dwayne D Callwood MD Electronically signed by Dwayne D  Callwood MD Signature Date/Time: 01/16/2024/7:58:04 AM    Final    Portable Chest 1 View Result Date: 01/15/2024 CLINICAL DATA:  Syncope. Loss of consciousness with resulting right ankle injury. EXAM: PORTABLE CHEST 1 VIEW COMPARISON:  Radiographs 04/11/2023 and 09/21/2017.  CT 09/21/2017. FINDINGS: 1315 hours. The heart size and mediastinal contours are stable with mild aortic atherosclerosis. There is stable mild scarring at both lung apices. The lungs are otherwise clear. No pleural effusion or pneumothorax. No acute osseous findings are evident. Bilateral breast implants are noted. IMPRESSION: No evidence of acute cardiopulmonary process. Aortic atherosclerosis. Electronically Signed   By: Elsie Perone M.D.   On: 01/15/2024 13:36   CT Cervical Spine Wo Contrast Result Date: 01/15/2024 CLINICAL DATA:  Neck trauma.  Syncopal episode yesterday with fall. EXAM: CT CERVICAL SPINE WITHOUT CONTRAST TECHNIQUE: Multidetector CT imaging of the cervical spine was performed without intravenous contrast. Multiplanar CT image reconstructions were also generated. RADIATION DOSE REDUCTION: This exam was performed according to the departmental dose-optimization program which includes automated exposure control, adjustment of the mA and/or kV according to patient size and/or use of iterative reconstruction technique. COMPARISON:  None Available. FINDINGS: Alignment: Normal. Skull base and vertebrae: No evidence of acute fracture or traumatic subluxation. Soft tissues and spinal canal: No prevertebral fluid or swelling. No visible canal hematoma. Disc levels: Mild-to-moderate multilevel spondylosis for age with disc space narrowing, endplate osteophytes, uncinate spurring and facet hypertrophy. No large disc herniation  identified. Up to moderate osseous foraminal narrowing which appears worst at C5-6 and C6-7. Upper chest: Mild biapical scarring. Aortic and carotid atherosclerosis. Other: Bilateral TMJ degenerative changes. IMPRESSION: 1. No evidence of acute cervical spine fracture, traumatic subluxation or static signs of instability. 2. Mild-to-moderate multilevel cervical spondylosis for age. 3.  Aortic Atherosclerosis (ICD10-I70.0). Electronically Signed   By: Elsie Perone M.D.   On: 01/15/2024 11:23   CT HEAD WO CONTRAST ( ) Result Date: 01/15/2024 EXAM: CT HEAD WITHOUT CONTRAST 01/15/2024 11:09:07 AM TECHNIQUE: CT of the head was performed without the administration of intravenous contrast. Automated exposure control, iterative reconstruction, and/or weight based adjustment of the mA/kV was utilized to reduce the radiation dose to as low as reasonably achievable. COMPARISON: CT of the head dated 09/21/2017. CLINICAL HISTORY: Head trauma, minor (Age >= 65y). Pt to ED from with family member for R ankle injury from syncopal episode yesterday at 5pm when she was getting up out of a chair. Both lower legs have bronze discoloration, but R lower leg is red and swollen. Painful to bear weight. Also daughter in law mentions some decline in cognitive function and mental status since last few months. Pt is alert and oriented. FINDINGS: BRAIN AND VENTRICLES: No acute hemorrhage. No mass. No acute cortical infarct. No hydrocephalus. Moderate cerebral white matter disease. ORBITS: Patient is status post bilateral lens surgery. The patient is status post bilateral lens replacement. SINUSES: Mild mucosal disease within the right ethmoid air cells and the sphenoid sinus. SOFT TISSUES AND SKULL: No acute soft tissue abnormality. No skull fracture. IMPRESSION: 1. No acute intracranial abnormality. 2. Moderate cerebral white matter disease. Electronically signed by: Evalene Coho MD 01/15/2024 11:21 AM EDT RP Workstation:  [id number]   DG Tibia/Fibula Right Result Date: 01/15/2024 CLINICAL DATA:  fall.  Pain in the ankle and foot. EXAM: RIGHT FOOT COMPLETE - 3+ VIEW; RIGHT TIBIA AND FIBULA - 2 VIEW COMPARISON:  None Available. FINDINGS: There is mildly displaced and impacted fracture of the lateral malleolus with mild-to-moderate overlying soft tissue  swelling. No other acute fracture or dislocation. No aggressive osseous lesion. Ankle mortise appears intact. Mild degenerative changes of the imaged joints. No focal soft tissue swelling. No radiopaque foreign bodies. IMPRESSION: *Mildly displaced and impacted fracture of the lateral malleolus. Electronically Signed   By: Ree Molt M.D.   On: 01/15/2024 10:44   DG Foot Complete Right Result Date: 01/15/2024 CLINICAL DATA:  fall.  Pain in the ankle and foot. EXAM: RIGHT FOOT COMPLETE - 3+ VIEW; RIGHT TIBIA AND FIBULA - 2 VIEW COMPARISON:  None Available. FINDINGS: There is mildly displaced and impacted fracture of the lateral malleolus with mild-to-moderate overlying soft tissue swelling. No other acute fracture or dislocation. No aggressive osseous lesion. Ankle mortise appears intact. Mild degenerative changes of the imaged joints. No focal soft tissue swelling. No radiopaque foreign bodies. IMPRESSION: *Mildly displaced and impacted fracture of the lateral malleolus. Electronically Signed   By: Ree Molt M.D.   On: 01/15/2024 10:44          Laneta Blunt, DO [facility] 01/18/2024, 2:58 PM    Dictation software may have been used to generate the above note. Typos may occur and escape review in typed/dictated notes. Please contact Dr Blunt directly for clarity if needed.  Staff may message me via secure chat in Epic  but this may not receive an immediate response,  please page me for urgent matters!  If 7PM-7AM, please contact night coverage [email or web address]

## 2024-01-19 DIAGNOSIS — R55 Syncope and collapse: Secondary | ICD-10-CM | POA: Diagnosis not present

## 2024-01-19 NOTE — Plan of Care (Signed)
  Problem: Cardiac: Goal: Will achieve and/or maintain adequate cardiac output Outcome: Progressing   Problem: Physical Regulation: Goal: Complications related to the disease process, condition or treatment will be avoided or minimized Outcome: Progressing   Problem: Clinical Measurements: Goal: Ability to maintain clinical measurements within normal limits will improve Outcome: Progressing Goal: Diagnostic test results will improve Outcome: Progressing   Problem: Activity: Goal: Risk for activity intolerance will decrease Outcome: Progressing   Problem: Nutrition: Goal: Adequate nutrition will be maintained Outcome: Progressing   Problem: Elimination: Goal: Will not experience complications related to bowel motility Outcome: Progressing Goal: Will not experience complications related to urinary retention Outcome: Progressing   Problem: Pain Managment: Goal: General experience of comfort will improve and/or be controlled Outcome: Progressing   Problem: Safety: Goal: Ability to remain free from injury will improve Outcome: Progressing   Problem: Skin Integrity: Goal: Risk for impaired skin integrity will decrease Outcome: Progressing

## 2024-01-19 NOTE — Progress Notes (Signed)
 PROGRESS NOTE    [name]   FMW:[id number] DOB: 03-Aug-1934  DOA: 01/15/2024 Date of Service: 01/19/24 which is hospital day 4  PCP: Rudolpho Norleen BIRCH, MD    Hospital course / significant events:   HPI: [name] is a 89 year old female with history of htn, anxiety, leukopenia requiring transfusion (filgrastim ), who presents ED for chief concerns of syncope.  She reports that she does not know what happened yesterday.  While she noticed that she stood up and felt dizzy and then the next thing, she just was laying on the floor.  She reports this is never happened before.    08/19: to ED. VSS. CT head: No acute intracranial abnormality. Moderate cerebral white matter disease. X-ray does confirm mildly displaced and impacted R lateral malleolus fracture.  08/20: Echo no concerns. PT/OT recs for SNF given pt lives alone and NWB for now w/ fracture.  08/21-08/23: pending for SNF rehab      Consultants:  none  Procedures/Surgeries: none      ASSESSMENT & PLAN:   Syncope Suspect orthostatic in setting of patient standing up and experiencing syncope Complete echo ordered - no concerns Fall precaution Reduced BP meds    Anxiety Home alprazolam  0.25 mg p.o. twice daily as needed for anxiety/sleep resumed PDMP reviewed   Closed fracture of lateral malleolus Mildly displaced, impacted fracture Pain control fall precaution PT/OT  SNF rehab   Hx essential HTN (hypertension) Low diastolic pressure  Amlodipine  10 mg daily dc given orthostatic hypotension and fall/syncope --> systolic BP has been 120s-150s, diastolic has been 40s-50s   Temporal arteritis  chronic prednisone  5 mg daily    Overweight based on BMI: Body mass index is 25.82 kg/m.SABRA Significantly low or high BMI is associated with higher medical risk.  Underweight - under 18  overweight - 25 to 29 obese - 30 or more Class 1 obesity: BMI of 30.0 to 34 Class 2 obesity: BMI of 35.0 to  39 Class 3 obesity: BMI of 40.0 to 49 Super Morbid Obesity: BMI 50-59 Super-super Morbid Obesity: BMI 60+ Healthy nutrition and physical activity advised as adjunct to other disease management and risk reduction treatments    DVT prophylaxis: lovenox  IV fluids: no continuous IV fluids  Nutrition: cardiac Central lines / other devices: none  Code Status: FULL CODE ACP documentation reviewed:  none on file in [facility]  [facility] needs: SNF placement Medical barriers to dispo: none. Expected medical readiness for discharge once placement arranged.              Subjective / Brief ROS:  No concerns - pt states tramadol  is working well for pain today    Family Communication: family at bedside     Objective Findings:  Vitals:   01/18/24 1647 01/18/24 2115 01/19/24 0408 01/19/24 0751  BP: (!) 148/49 (!) 157/60 (!) 141/52 (!) 147/52  Pulse: 87 88 74 81  Resp: 15 18 17 18   Temp: 98 F (36.7 C) 98.3 F (36.8 C) 97.8 F (36.6 C) 97.7 F (36.5 C)  TempSrc:      SpO2: 100% 98% 96% 96%  Weight:      Height:        Intake/Output Summary (Last 24 hours) at 01/19/2024 1404 Last data filed at 01/19/2024 0900 Gross per 24 hour  Intake 480 ml  Output 300 ml  Net 180 ml   Filed Weights   01/15/24 1009 01/16/24 2100 01/17/24 0500  Weight: 72.6 kg 72.9 kg 67.7 kg  Examination:  Physical Exam Constitutional:      General: She is not in acute distress.    Appearance: She is not ill-appearing.  Cardiovascular:     Rate and Rhythm: Normal rate and regular rhythm.  Pulmonary:     Effort: Pulmonary effort is normal.     Breath sounds: Normal breath sounds.  Abdominal:     Palpations: Abdomen is soft.  Musculoskeletal:     Right lower leg: No edema.     Left lower leg: No edema.     Comments: NV intact distal LE  Neurological:     Mental Status: She is alert and oriented to person, place, and time. Mental status is at baseline.  Psychiatric:        Mood and Affect: Mood  normal.        Behavior: Behavior normal.          Scheduled Medications:   vitamin C   1,000 mg Oral Daily   cholecalciferol   2,000 Units Oral Daily   enoxaparin  (LOVENOX ) injection  40 mg Subcutaneous Q24H   polyethylene glycol  17 g Oral Daily   predniSONE   5 mg Oral Daily   sodium chloride  flush  3 mL Intravenous Q12H    Continuous Infusions:   PRN Medications:  acetaminophen  **OR** acetaminophen , ALPRAZolam , ondansetron  **OR** ondansetron  (ZOFRAN ) IV, traMADol   Antimicrobials from admission:  Anti-infectives (From admission, onward)    None           Data Reviewed:  I have personally reviewed the following...  CBC: Recent Labs  Lab 01/15/24 1011 01/16/24 0618  WBC 3.2* 3.0*  HGB 13.6 13.4  HCT 42.6 41.9  MCV 91.6 91.1  PLT 229 220   Basic Metabolic Panel: Recent Labs  Lab 01/15/24 1011 01/16/24 0618  NA 142 142  K 4.0 3.7  CL 104 107  CO2 30 24  GLUCOSE 100* 103*  BUN 16 13  CREATININE 0.90 0.92  CALCIUM 9.1 8.9   GFR: Estimated Creatinine Clearance: 40.3 mL/min (by C-G formula based on SCr of 0.92 mg/dL). Liver Function Tests: Recent Labs  Lab 01/15/24 1011  AST 27  ALT 15  ALKPHOS 87  BILITOT 1.7*  PROT 7.0  ALBUMIN 4.0   No results for input(s): LIPASE, AMYLASE in the last 168 hours. No results for input(s): AMMONIA in the last 168 hours. Coagulation Profile: No results for input(s): INR, PROTIME in the last 168 hours. Cardiac Enzymes: No results for input(s): CKTOTAL, CKMB, CKMBINDEX, TROPONINI in the last 168 hours. BNP (last 3 results) No results for input(s): PROBNP in the last 8760 hours. HbA1C: No results for input(s): HGBA1C in the last 72 hours. CBG: Recent Labs  Lab 01/16/24 0624 01/17/24 0516  GLUCAP 105* 97   Lipid Profile: No results for input(s): CHOL, HDL, LDLCALC, TRIG, CHOLHDL, LDLDIRECT in the last 72 hours. Thyroid  Function Tests: No results for input(s): TSH,  T4TOTAL, FREET4, T3FREE, THYROIDAB in the last 72 hours. Anemia Panel: No results for input(s): VITAMINB12, FOLATE, FERRITIN, TIBC, IRON, RETICCTPCT in the last 72 hours. Most Recent Urinalysis On File:     Component Value Date/Time   COLORURINE STRAW (A) 01/15/2024 1236   APPEARANCEUR CLEAR (A) 01/15/2024 1236   APPEARANCEUR Clear 11/04/2014 0845   LABSPEC 1.005 01/15/2024 1236   LABSPEC 1.025 08/11/2012 1358   PHURINE 8.0 01/15/2024 1236   GLUCOSEU NEGATIVE 01/15/2024 1236   GLUCOSEU Negative 08/11/2012 1358   HGBUR NEGATIVE 01/15/2024 1236   BILIRUBINUR NEGATIVE 01/15/2024 1236  BILIRUBINUR Negative 11/04/2014 0845   BILIRUBINUR Negative 08/11/2012 1358   KETONESUR NEGATIVE 01/15/2024 1236   PROTEINUR NEGATIVE 01/15/2024 1236   UROBILINOGEN 1.0 08/30/2007 2131   NITRITE NEGATIVE 01/15/2024 1236   LEUKOCYTESUR NEGATIVE 01/15/2024 1236   LEUKOCYTESUR Negative 08/11/2012 1358   Sepsis Labs: @LABRCNTIP (procalcitonin:4,lacticidven:4) Microbiology: No results found for this or any previous visit (from the past 240 hours).    Radiology Studies last 3 days: ECHOCARDIOGRAM COMPLETE Result Date: 01/16/2024    ECHOCARDIOGRAM REPORT   Patient Name:   BRAYLEY MACKOWIAK [facility] Date of Exam: 01/15/2024 Medical Rec #:  [id number]           Height:       66.0 in Accession #:    [id number]          Weight:       160.0 lb Date of Birth:  August 25, 1934           BSA:          1.819 m Patient Age:    89 years            BP:           150/52 mmHg Patient Gender: F                   HR:           71 bpm. Exam Location:  [facility] Procedure: 2D Echo, Cardiac Doppler and Color Doppler (Both Spectral and Color            Flow Doppler were utilized during procedure). Indications:     Dyspnea R06.00                  Syncope R55  History:         Patient has no prior history of Echocardiogram examinations.                  Risk Factors:Hypertension and Dyslipidemia.  Sonographer:     Christopher Furnace  Referring Phys:  [id number] AMY N COX Diagnosing Phys: Cara JONETTA Lovelace MD  Sonographer Comments: Image acquisition challenging due to breast implants. IMPRESSIONS  1. Left ventricular ejection fraction, by estimation, is 55 to 60%. The left ventricle has normal function. The left ventricle has no regional wall motion abnormalities. There is mild asymmetric left ventricular hypertrophy of the basal segment. Left ventricular diastolic parameters are consistent with Grade I diastolic dysfunction (impaired relaxation).  2. Right ventricular systolic function is normal. The right ventricular size is normal.  3. The mitral valve is grossly normal. Mild mitral valve regurgitation.  4. The aortic valve is normal in structure. Aortic valve regurgitation is mild. Aortic valve sclerosis is present, with no evidence of aortic valve stenosis. Conclusion(s)/Recommendation(s): Poor windows for evaluation of left ventricular function by transthoracic echocardiography. Would recommend an alternative means of evaluation. FINDINGS  Left Ventricle: Left ventricular ejection fraction, by estimation, is 55 to 60%. The left ventricle has normal function. The left ventricle has no regional wall motion abnormalities. Strain was performed and the global longitudinal strain is indeterminate. The left ventricular internal cavity size was normal in size. There is mild asymmetric left ventricular hypertrophy of the basal segment. Left ventricular diastolic parameters are consistent with Grade I diastolic dysfunction (impaired relaxation). Right Ventricle: The right ventricular size is normal. No increase in right ventricular wall thickness. Right ventricular systolic function is normal. Left Atrium: Left atrial size was normal in size. Right Atrium: Right atrial size was normal  in size. Pericardium: There is no evidence of pericardial effusion. Mitral Valve: The mitral valve is grossly normal. There is mild thickening of the mitral valve  leaflet(s). There is mild calcification of the mitral valve leaflet(s). Mild mitral valve regurgitation. MV peak gradient, 16.0 mmHg. The mean mitral valve gradient is 8.0 mmHg. Tricuspid Valve: The tricuspid valve is normal in structure. Tricuspid valve regurgitation is mild. Aortic Valve: The aortic valve is normal in structure. Aortic valve regurgitation is mild. Aortic valve sclerosis is present, with no evidence of aortic valve stenosis. Aortic valve mean gradient measures 4.5 mmHg. Aortic valve peak gradient measures 7.2  mmHg. Aortic valve area, by VTI measures 3.30 cm. Pulmonic Valve: The pulmonic valve was normal in structure. Pulmonic valve regurgitation is not visualized. Aorta: The ascending aorta was not well visualized. IAS/Shunts: No atrial level shunt detected by color flow Doppler. Additional Comments: 3D was performed not requiring image post processing on an independent workstation and was indeterminate.  LEFT VENTRICLE PLAX 2D LVIDd:         3.20 cm   Diastology LVIDs:         2.30 cm   LV e' medial:    7.94 cm/s LV PW:         1.30 cm   LV E/e' medial:  15.9 LV IVS:        1.10 cm   LV e' lateral:   5.87 cm/s LVOT diam:     2.00 cm   LV E/e' lateral: 21.5 LV SV:         91 LV SV Index:   50 LVOT Area:     3.14 cm  RIGHT VENTRICLE RV Basal diam:  2.70 cm RV Mid diam:    2.40 cm RV S prime:     15.00 cm/s TAPSE (M-mode): 1.7 cm LEFT ATRIUM             Index        RIGHT ATRIUM           Index LA diam:        2.90 cm 1.59 cm/m   RA Area:     11.80 cm LA Vol (A2C):   55.0 ml 30.24 ml/m  RA Volume:   25.30 ml  13.91 ml/m LA Vol (A4C):   30.5 ml 16.77 ml/m LA Biplane Vol: 45.7 ml 25.13 ml/m  AORTIC VALVE AV Area (Vmax):    2.84 cm AV Area (Vmean):   2.77 cm AV Area (VTI):     3.30 cm AV Vmax:           134.00 cm/s AV Vmean:          97.200 cm/s AV VTI:            0.275 m AV Peak Grad:      7.2 mmHg AV Mean Grad:      4.5 mmHg LVOT Vmax:         121.00 cm/s LVOT Vmean:        85.600 cm/s  LVOT VTI:          0.289 m LVOT/AV VTI ratio: 1.05  AORTA Ao Root diam: 3.00 cm MITRAL VALVE                TRICUSPID VALVE MV Area (PHT): 1.97 cm     TR Peak grad:   16.2 mmHg MV Area VTI:   1.83 cm     TR Vmax:  201.00 cm/s MV Peak grad:  16.0 mmHg MV Mean grad:  8.0 mmHg     SHUNTS MV Vmax:       2.00 m/s     Systemic VTI:  0.29 m MV Vmean:      132.0 cm/s   Systemic Diam: 2.00 cm MV Decel Time: 385 msec MV E velocity: 126.00 cm/s MV A velocity: 164.00 cm/s MV E/A ratio:  0.77 Cara JONETTA Lovelace MD Electronically signed by Cara JONETTA Lovelace MD Signature Date/Time: 01/16/2024/7:58:04 AM    Final           Laneta Blunt, DO [facility] 01/19/2024, 2:04 PM    Dictation software may have been used to generate the above note. Typos may occur and escape review in typed/dictated notes. Please contact Dr Blunt directly for clarity if needed.  Staff may message me via secure chat in Epic  but this may not receive an immediate response,  please page me for urgent matters!  If 7PM-7AM, please contact night coverage [email or web address]

## 2024-01-19 NOTE — TOC Progression Note (Signed)
 Transition of Care [facility]) - Progression Note    Patient Details  Name: FELICIA BLOOMQUIST MRN: [id number] Date of Birth: 09/23/1934  Transition of Care [facility]) CM/SW Contact  [place] L Roddy Bellamy, [place] Phone Number: 01/19/2024, 2:48 PM  Clinical Narrative:     CSW spoke with daughter, Jon Monte, regarding bed offers. Ms. Monte stated that her mother lives alone and shouldn't discharge home. She advised that she wants to wait for [facility] to find a bed. She advised that the facility can't offer a bed until Monday. Ms. Monte stated that she lives near [facility] and she is in town now but there is no one to look after her mother.                     Expected Discharge Plan and Services                                               Social Drivers of Health (SDOH) Interventions SDOH Screenings   Food Insecurity: No Food Insecurity (01/16/2024)  Housing: Low Risk  (01/16/2024)  Transportation Needs: No Transportation Needs (01/16/2024)  Utilities: Not At Risk (01/16/2024)  Social Connections: Socially Isolated (01/16/2024)  Tobacco Use: Medium Risk (01/15/2024)    Readmission Risk Interventions     No data to display

## 2024-01-20 DIAGNOSIS — R55 Syncope and collapse: Secondary | ICD-10-CM | POA: Diagnosis not present

## 2024-01-20 MED ORDER — SENNOSIDES-DOCUSATE SODIUM 8.6-50 MG PO TABS
2.0000 | ORAL_TABLET | Freq: Two times a day (BID) | ORAL | Status: DC | PRN
  Administered 2024-01-20 – 2024-01-21 (×2): 2 via ORAL
  Filled 2024-01-20 (×2): qty 2

## 2024-01-20 MED ORDER — BISACODYL 10 MG RE SUPP
10.0000 mg | Freq: Every day | RECTAL | Status: DC | PRN
  Administered 2024-01-20: 10 mg via RECTAL
  Filled 2024-01-20: qty 1

## 2024-01-20 NOTE — Progress Notes (Signed)
 Physical Therapy Treatment Patient Details Name: [name] MRN: [id number] DOB: 04/19/1935 Today's Date: 01/20/2024   History of Present Illness [name] is an 89 year old female with history of HTN, anxiety, leukopenia requiring transfusion (filgrastim ), who presents ED for chief concerns of syncope, mechanical fall, and R ankle pain. Imaging shows R mildly displaced and impacted fracture of the lateral malleolus.    PT Comments  Patient received sidelying in bed. RN had recently given her a suppository to help with BM but approved PT working with patient (all hoping that mobility would also help with BM). Patient eager to work with PT after learning that RN approved following suppository. Session focused on improving functional strength for transfers and short ambulation to improve patient's functional mobility and independence. She needed supervision to min A for bed mobility. She needed CGA-MinA for transfers using RW. She was able to verbalize weight bearing precautions at start of session but was later surprised when PT told her she could not put weight through the R LE when transferring. She did have BM x2 during session so practiced transfers to/from [name]. She needed assistance with pericare and changing of underwear which PT helped with. Patient demonstrated the ability to perform hopping during short ambulation with RW and CGA. She had intermittent lapses in insight into her precautions or how to follow recommended hand, body, and AD placement during mobility. Overall making progress towards goals but still needs assistance with all out of bed mobility (which is very restricted) due to NWB status. Patient would benefit from skilled physical therapy to address impairments and functional limitations (see PT Problem List below) to work towards stated goals and return to PLOF or maximal functional independence.      If plan is discharge home, recommend the following: A lot of help  with walking and/or transfers;A lot of help with bathing/dressing/bathroom   Can travel by private vehicle     No  Equipment Recommendations  Rolling walker (2 wheels);BSC/3in1;Wheelchair cushion (measurements PT);Wheelchair (measurements PT);Other (comment) (TBD at next venue of care)    Recommendations for Other Services       Precautions / Restrictions Precautions Precautions: Fall Recall of Precautions/Restrictions: Impaired Precaution/Restrictions Comments: Patient able to state NWB on R LE at start of session, but during session seemed surprised that meant no weight bearing during transfers. Required Braces or Orthoses: Other Brace Other Brace: CAM boot Restrictions Weight Bearing Restrictions Per Provider Order: Yes RLE Weight Bearing Per Provider Order: Non weight bearing Other Position/Activity Restrictions: CAM Boot to RLE     Mobility  Bed Mobility   Bed Mobility: Supine to Sit, Sit to Supine     Supine to sit: Supervision Sit to supine: Min assist   General bed mobility comments: needed min A to move R LE into bed when going sit to supine    Transfers Overall transfer level: Needs assistance Equipment used: Rolling walker (2 wheels) Transfers: Sit to/from Stand Sit to Stand: Min assist, From elevated surface, Contact guard assist   Step pivot transfers: Contact guard assist, Min assist       General transfer comment: cuing for R LE held above ground to prevnt WB. Pt surprised asking how can I stand up when cued to keep R LE off the floor. Practiced 2x5 sit <> stand to RW at edge of elevated bed and 2x bed <> BSC with RW. Min A intermittantly to keep R foot off floor.    Ambulation/Gait Ambulation/Gait assistance: Contact guard assist Gait Distance (  Feet): 3 Feet Assistive device: Rolling walker (2 wheels)   Gait velocity: decreased     General Gait Details: Pt able to take several hopping steps when transferring between bed and BSC with RW  x2.   Stairs             Wheelchair Mobility     Tilt Bed    Modified Rankin (Stroke Patients Only)       Balance Overall balance assessment: Needs assistance Sitting-balance support: No upper extremity supported, Feet unsupported Sitting balance-Leahy Scale: Normal     Standing balance support: Bilateral upper extremity supported, During functional activity, Reliant on assistive device for balance Standing balance-Leahy Scale: Fair                              Hotel manager: No apparent difficulties  Cognition Arousal: Alert Behavior During Therapy: WFL for tasks assessed/performed   PT - Cognitive impairments: Awareness                       PT - Cognition Comments: Oriented to self, location and general situation; limited insight into deficits; limited recall/carry-over of new information (even between reps); able to recall weight bearing precautions at start of session, but was then suprised when PT said she could not put any weight on her R LE during transfers. Needed step by step cuing for hand placement and body position during transfers with inconsistent carry over. Thought PT was was the nurse who gave her the supossitory when PT was assisting her with pericare. Seemed to think PT was there to help her have BM as a primary goal at times. Following commands: Impaired Following commands impaired: Follows one step commands with increased time    Cueing Cueing Techniques: Verbal cues, Tactile cues, Visual cues  Exercises Other Exercises Other Exercises: practiced sit <> stand at edge of elevated bed: 3x5 reps to improve LE strength and power while reinforcing hand placement and weight bearing precautions. Other Exercises: Pateint assisted with pericare after BM x2 on BSC. Also assisted with change of soiled underwear.    General Comments        Pertinent Vitals/Pain Pain Assessment Pain Assessment:  Faces Pain Location: R foot/ankle Pain Descriptors / Indicators: Grimacing, Guarding Pain Intervention(s): Limited activity within patient's tolerance, Monitored during session, Repositioned    Home Living                          Prior Function            PT Goals (current goals can now be found in the care plan section) Acute Rehab PT Goals Patient Stated Goal: to get back home by myself PT Goal Formulation: With patient/family Time For Goal Achievement: 01/30/24 Potential to Achieve Goals: Good Progress towards PT goals: Progressing toward goals    Frequency    Min 3X/week      PT Plan      Co-evaluation              AM-PAC PT 6 Clicks Mobility   Outcome Measure  Help needed turning from your back to your side while in a flat bed without using bedrails?: None Help needed moving from lying on your back to sitting on the side of a flat bed without using bedrails?: None Help needed moving to and from a bed to a chair (including a wheelchair)?:  A Little Help needed standing up from a chair using your arms (e.g., wheelchair or bedside chair)?: A Little Help needed to walk in hospital room?: A Lot Help needed climbing 3-5 steps with a railing? : A Lot 6 Click Score: 18    End of Session Equipment Utilized During Treatment: Gait belt Activity Tolerance: Patient tolerated treatment well Patient left: with call bell/phone within reach;in bed;with bed alarm set Nurse Communication: Mobility status;Weight bearing status PT Visit Diagnosis: Muscle weakness (generalized) (M62.81);History of falling (Z91.81);Difficulty in walking, not elsewhere classified (R26.2)     Time: 8485-8446 PT Time Calculation (min) (ACUTE ONLY): 39 min  Charges:    $Therapeutic Activity: 23-37 mins PT General Charges $$ ACUTE PT VISIT: 1 Visit                     Camie SAUNDERS. Juli, PT, DPT, Cert. MDT 01/20/24, 4:19 PM

## 2024-01-20 NOTE — Progress Notes (Signed)
 PROGRESS NOTE    [name]   FMW:[id number] DOB: [birth year]  DOA: 01/15/2024 Date of Service: 01/20/24 which is hospital day 5  PCP: Rudolpho Norleen BIRCH, MD    Hospital course / significant events:   HPI: [name] is a 89 year old female with history of htn, anxiety, leukopenia requiring transfusion (filgrastim ), who presents ED for chief concerns of syncope.  She reports that she does not know what happened yesterday.  While she noticed that she stood up and felt dizzy and then the next thing, she just was laying on the floor.  She reports this is never happened before.    08/19: to ED. VSS. CT head: No acute intracranial abnormality. Moderate cerebral white matter disease. X-ray does confirm mildly displaced and impacted R lateral malleolus fracture.  08/20: Echo no concerns. [name]/OT recs for SNF given [name] lives alone and NWB for now w/ fracture.  08/21-08/24: pending for SNF rehab      Consultants:  none  Procedures/Surgeries: none      ASSESSMENT & PLAN:   Syncope Suspect orthostatic in setting of patient standing up and experiencing syncope Complete echo ordered - no concerns Fall precaution Reduced BP meds    Anxiety Home alprazolam  0.25 mg p.o. twice daily as needed for anxiety/sleep resumed PDMP reviewed   Closed fracture of lateral malleolus Mildly displaced, impacted fracture Pain control fall precaution [name]/OT  SNF rehab   Hx essential HTN (hypertension) Low diastolic pressure  Amlodipine  10 mg daily dc given orthostatic hypotension and fall/syncope --> systolic BP has been 120s-150s, diastolic has been 40s-50s   Temporal arteritis  chronic prednisone  5 mg daily  Constipation, chronic Daily miralax  Can add prn senokot tab +/- bisacodyl  suppository  Overweight based on BMI: Body mass index is 25.82 kg/m.SABRA Significantly low or high BMI is associated with higher medical risk.  Underweight - under 18  overweight - 25 to 29 obese  - 30 or more Class 1 obesity: BMI of 30.0 to 34 Class 2 obesity: BMI of 35.0 to 39 Class 3 obesity: BMI of 40.0 to 49 Super Morbid Obesity: BMI 50-59 Super-super Morbid Obesity: BMI 60+ Healthy nutrition and physical activity advised as adjunct to other disease management and risk reduction treatments    DVT prophylaxis: lovenox  IV fluids: no continuous IV fluids  Nutrition: cardiac Central lines / other devices: none  Code Status: FULL CODE ACP documentation reviewed:  none on file in [facility]  [facility] needs: SNF placement Medical barriers to dispo: none. Expected medical readiness for discharge once placement arranged.              Subjective / Brief ROS:  Constipation is about same for [name], 2-3 BM per week is normal. No abd pain. Reports a bit more ankle pain today but would like to stay on the tramadol  for now and avoid stronger opiates   Family Communication: family at bedside     Objective Findings:  Vitals:   01/19/24 1651 01/19/24 1959 01/20/24 0432 01/20/24 0829  BP: (!) 147/54 (!) 141/58 (!) 121/53 (!) 142/41  Pulse: 96 95 73 78  Resp: 20 18 18 18   Temp: 98.1 F (36.7 C) 97.7 F (36.5 C) 97.8 F (36.6 C) 97.8 F (36.6 C)  TempSrc:  Oral Oral   SpO2: 97% 97% 100% 100%  Weight:      Height:        Intake/Output Summary (Last 24 hours) at 01/20/2024 1427 Last data filed at 01/20/2024 0913 Gross per  24 hour  Intake 363 ml  Output --  Net 363 ml   Filed Weights   01/15/24 1009 01/16/24 2100 01/17/24 0500  Weight: 72.6 kg 72.9 kg 67.7 kg    Examination:  Physical Exam Constitutional:      General: She is not in acute distress.    Appearance: She is not ill-appearing.  Cardiovascular:     Rate and Rhythm: Normal rate and regular rhythm.  Pulmonary:     Effort: Pulmonary effort is normal.     Breath sounds: Normal breath sounds.  Abdominal:     Palpations: Abdomen is soft.  Musculoskeletal:     Right lower leg: No edema.     Left lower leg:  No edema.     Comments: NV intact distal LE  Neurological:     Mental Status: She is alert and oriented to person, place, and time. Mental status is at baseline.  Psychiatric:        Mood and Affect: Mood normal.        Behavior: Behavior normal.          Scheduled Medications:   vitamin C   1,000 mg Oral Daily   cholecalciferol   2,000 Units Oral Daily   enoxaparin  (LOVENOX ) injection  40 mg Subcutaneous Q24H   polyethylene glycol  17 g Oral Daily   predniSONE   5 mg Oral Daily   sodium chloride  flush  3 mL Intravenous Q12H    Continuous Infusions:   PRN Medications:  ALPRAZolam , bisacodyl , senna-docusate, traMADol   Antimicrobials from admission:  Anti-infectives (From admission, onward)    None           Data Reviewed:  I have personally reviewed the following...  CBC: Recent Labs  Lab 01/15/24 1011 01/16/24 0618  WBC 3.2* 3.0*  HGB 13.6 13.4  HCT 42.6 41.9  MCV 91.6 91.1  PLT 229 220   Basic Metabolic Panel: Recent Labs  Lab 01/15/24 1011 01/16/24 0618  NA 142 142  K 4.0 3.7  CL 104 107  CO2 30 24  GLUCOSE 100* 103*  BUN 16 13  CREATININE 0.90 0.92  CALCIUM 9.1 8.9   GFR: Estimated Creatinine Clearance: 40.3 mL/min (by C-G formula based on SCr of 0.92 mg/dL). Liver Function Tests: Recent Labs  Lab 01/15/24 1011  AST 27  ALT 15  ALKPHOS 87  BILITOT 1.7*  PROT 7.0  ALBUMIN 4.0   No results for input(s): LIPASE, AMYLASE in the last 168 hours. No results for input(s): AMMONIA in the last 168 hours. Coagulation Profile: No results for input(s): INR, PROTIME in the last 168 hours. Cardiac Enzymes: No results for input(s): CKTOTAL, CKMB, CKMBINDEX, TROPONINI in the last 168 hours. BNP (last 3 results) No results for input(s): PROBNP in the last 8760 hours. HbA1C: No results for input(s): HGBA1C in the last 72 hours. CBG: Recent Labs  Lab 01/16/24 0624 01/17/24 0516  GLUCAP 105* 97   Lipid Profile: No  results for input(s): CHOL, HDL, LDLCALC, TRIG, CHOLHDL, LDLDIRECT in the last 72 hours. Thyroid  Function Tests: No results for input(s): TSH, T4TOTAL, FREET4, T3FREE, THYROIDAB in the last 72 hours. Anemia Panel: No results for input(s): VITAMINB12, FOLATE, FERRITIN, TIBC, IRON, RETICCTPCT in the last 72 hours. Most Recent Urinalysis On File:     Component Value Date/Time   COLORURINE STRAW (A) 01/15/2024 1236   APPEARANCEUR CLEAR (A) 01/15/2024 1236   APPEARANCEUR Clear 11/04/2014 0845   LABSPEC 1.005 01/15/2024 1236   LABSPEC 1.025 08/11/2012 1358  PHURINE 8.0 01/15/2024 1236   GLUCOSEU NEGATIVE 01/15/2024 1236   GLUCOSEU Negative 08/11/2012 1358   HGBUR NEGATIVE 01/15/2024 1236   BILIRUBINUR NEGATIVE 01/15/2024 1236   BILIRUBINUR Negative 11/04/2014 0845   BILIRUBINUR Negative 08/11/2012 1358   KETONESUR NEGATIVE 01/15/2024 1236   PROTEINUR NEGATIVE 01/15/2024 1236   UROBILINOGEN 1.0 08/30/2007 2131   NITRITE NEGATIVE 01/15/2024 1236   LEUKOCYTESUR NEGATIVE 01/15/2024 1236   LEUKOCYTESUR Negative 08/11/2012 1358   Sepsis Labs: @LABRCNTIP (procalcitonin:4,lacticidven:4) Microbiology: No results found for this or any previous visit (from the past 240 hours).    Radiology Studies last 3 days: No results found.         Rhianna Raulerson, DO [facility] 01/20/2024, 2:27 PM    Dictation software may have been used to generate the above note. Typos may occur and escape review in typed/dictated notes. Please contact Dr Marsa directly for clarity if needed.  Staff may message me via secure chat in Epic  but this may not receive an immediate response,  please page me for urgent matters!  If 7PM-7AM, please contact night coverage [email or web address]

## 2024-01-20 NOTE — Plan of Care (Signed)

## 2024-01-21 DIAGNOSIS — R55 Syncope and collapse: Secondary | ICD-10-CM | POA: Diagnosis not present

## 2024-01-21 LAB — URINALYSIS, W/ REFLEX TO CULTURE (INFECTION SUSPECTED)
Bilirubin Urine: NEGATIVE
Glucose, UA: NEGATIVE mg/dL
Hgb urine dipstick: NEGATIVE
Ketones, ur: NEGATIVE mg/dL
Leukocytes,Ua: NEGATIVE
Nitrite: NEGATIVE
Protein, ur: NEGATIVE mg/dL
Specific Gravity, Urine: 1.008 (ref 1.005–1.030)
pH: 5 (ref 5.0–8.0)

## 2024-01-21 MED ORDER — ACETAMINOPHEN 325 MG PO TABS
650.0000 mg | ORAL_TABLET | Freq: Four times a day (QID) | ORAL | Status: DC | PRN
  Administered 2024-01-21: 650 mg via ORAL
  Filled 2024-01-21: qty 2

## 2024-01-21 NOTE — Plan of Care (Signed)
 Patient had a good day and states she is ready for discharge.   Problem: Education: Goal: Knowledge of condition and prescribed therapy will improve Outcome: Progressing   Problem: Cardiac: Goal: Will achieve and/or maintain adequate cardiac output Outcome: Progressing   Problem: Physical Regulation: Goal: Complications related to the disease process, condition or treatment will be avoided or minimized Outcome: Progressing   Problem: Education: Goal: Knowledge of General Education information will improve Description: Including pain rating scale, medication(s)/side effects and non-pharmacologic comfort measures Outcome: Progressing   Problem: Health Behavior/Discharge Planning: Goal: Ability to manage health-related needs will improve Outcome: Progressing   Problem: Clinical Measurements: Goal: Ability to maintain clinical measurements within normal limits will improve Outcome: Progressing Goal: Will remain free from infection Outcome: Progressing Goal: Diagnostic test results will improve Outcome: Progressing Goal: Respiratory complications will improve Outcome: Progressing Goal: Cardiovascular complication will be avoided Outcome: Progressing   Problem: Activity: Goal: Risk for activity intolerance will decrease Outcome: Progressing   Problem: Nutrition: Goal: Adequate nutrition will be maintained Outcome: Progressing   Problem: Coping: Goal: Level of anxiety will decrease Outcome: Progressing   Problem: Elimination: Goal: Will not experience complications related to bowel motility Outcome: Progressing Goal: Will not experience complications related to urinary retention Outcome: Progressing   Problem: Pain Managment: Goal: General experience of comfort will improve and/or be controlled Outcome: Progressing   Problem: Safety: Goal: Ability to remain free from injury will improve Outcome: Progressing   Problem: Skin Integrity: Goal: Risk for impaired skin  integrity will decrease Outcome: Progressing

## 2024-01-21 NOTE — Progress Notes (Signed)
 PROGRESS NOTE    [name]   FMW:[id number] DOB: [birth year]  DOA: 01/15/2024 Date of Service: 01/21/24 which is hospital day 6  PCP: Rudolpho Norleen BIRCH, MD    Hospital course / significant events:   HPI: [name] is a 89 year old female with history of htn, anxiety, leukopenia requiring transfusion (filgrastim ), who presents ED for chief concerns of syncope.  She reports that she does not know what happened yesterday.  While she noticed that she stood up and felt dizzy and then the next thing, she just was laying on the floor.  She reports this is never happened before.    08/19: to ED. VSS. CT head: No acute intracranial abnormality. Moderate cerebral white matter disease. X-ray does confirm mildly displaced and impacted R lateral malleolus fracture.  08/20: Echo no concerns. PT/OT recs for SNF given pt lives alone and NWB for now w/ fracture.  08/21-08/25: pending for SNF rehab       Consultants:  none  Procedures/Surgeries: none      ASSESSMENT & PLAN:   Syncope Suspect orthostatic in setting of patient standing up and experiencing syncope Complete echo ordered - no concerns Fall precaution Reduced BP meds    Anxiety Home alprazolam  0.25 mg p.o. twice daily as needed for anxiety/sleep resumed PDMP reviewed   Closed fracture of lateral malleolus Mildly displaced, impacted fracture Pain control fall precaution PT/OT  SNF rehab   Hx essential HTN (hypertension) Low diastolic pressure  Amlodipine  10 mg daily dc given orthostatic hypotension and fall/syncope --> systolic BP has been 120s-150s, diastolic has been 40s-50s   Temporal arteritis  chronic prednisone  5 mg daily  Constipation, chronic Daily miralax  Can add prn senokot tab +/- bisacodyl  suppository  Abnormal color to urine Family concerned, requesting urine culture UA ordered  Overweight based on BMI: Body mass index is 25.82 kg/m.SABRA Significantly low or high BMI is  associated with higher medical risk.  Underweight - under 18  overweight - 25 to 29 obese - 30 or more Class 1 obesity: BMI of 30.0 to 34 Class 2 obesity: BMI of 35.0 to 39 Class 3 obesity: BMI of 40.0 to 49 Super Morbid Obesity: BMI 50-59 Super-super Morbid Obesity: BMI 60+ Healthy nutrition and physical activity advised as adjunct to other disease management and risk reduction treatments    DVT prophylaxis: lovenox  IV fluids: no continuous IV fluids  Nutrition: cardiac Central lines / other devices: none  Code Status: FULL CODE ACP documentation reviewed:  none on file in [facility]  [facility] needs: SNF placement Medical barriers to dispo: none. Expected medical readiness for discharge once placement arranged.              Subjective / Brief ROS:  Occasional shooting pain in ankle but not worth changing meds per patient. Family has concerns about dark urine color, pt has no complaints about dysuria. No abd pain.    Family Communication: family at bedside     Objective Findings:  Vitals:   01/20/24 1619 01/20/24 1924 01/21/24 0351 01/21/24 0812  BP: (!) 147/55 (!) 145/55 (!) 141/51 (!) 148/53  Pulse: 95 90 79 74  Resp: 18 18 18 20   Temp: 97.6 F (36.4 C) 98.2 F (36.8 C) (!) 97.5 F (36.4 C) 97.8 F (36.6 C)  TempSrc:      SpO2: 96% 98% 96% 96%  Weight:      Height:        Intake/Output Summary (Last 24 hours) at 01/21/2024 1602 Last  data filed at 01/21/2024 1300 Gross per 24 hour  Intake 543 ml  Output --  Net 543 ml   Filed Weights   01/15/24 1009 01/16/24 2100 01/17/24 0500  Weight: 72.6 kg 72.9 kg 67.7 kg    Examination:  Physical Exam Constitutional:      General: She is not in acute distress.    Appearance: She is not ill-appearing.  Cardiovascular:     Rate and Rhythm: Normal rate and regular rhythm.  Pulmonary:     Effort: Pulmonary effort is normal.     Breath sounds: Normal breath sounds.  Abdominal:     Palpations: Abdomen is soft.   Musculoskeletal:     Right lower leg: No edema.     Left lower leg: No edema.     Comments: NV intact distal LE  Neurological:     Mental Status: She is alert and oriented to person, place, and time. Mental status is at baseline.  Psychiatric:        Mood and Affect: Mood normal.        Behavior: Behavior normal.          Scheduled Medications:   vitamin C   1,000 mg Oral Daily   cholecalciferol   2,000 Units Oral Daily   enoxaparin  (LOVENOX ) injection  40 mg Subcutaneous Q24H   polyethylene glycol  17 g Oral Daily   predniSONE   5 mg Oral Daily   sodium chloride  flush  3 mL Intravenous Q12H    Continuous Infusions:   PRN Medications:  acetaminophen , ALPRAZolam , bisacodyl , senna-docusate, traMADol   Antimicrobials from admission:  Anti-infectives (From admission, onward)    None           Data Reviewed:  I have personally reviewed the following...  CBC: Recent Labs  Lab 01/15/24 1011 01/16/24 0618  WBC 3.2* 3.0*  HGB 13.6 13.4  HCT 42.6 41.9  MCV 91.6 91.1  PLT 229 220   Basic Metabolic Panel: Recent Labs  Lab 01/15/24 1011 01/16/24 0618  NA 142 142  K 4.0 3.7  CL 104 107  CO2 30 24  GLUCOSE 100* 103*  BUN 16 13  CREATININE 0.90 0.92  CALCIUM 9.1 8.9   GFR: Estimated Creatinine Clearance: 40.3 mL/min (by C-G formula based on SCr of 0.92 mg/dL). Liver Function Tests: Recent Labs  Lab 01/15/24 1011  AST 27  ALT 15  ALKPHOS 87  BILITOT 1.7*  PROT 7.0  ALBUMIN 4.0   No results for input(s): LIPASE, AMYLASE in the last 168 hours. No results for input(s): AMMONIA in the last 168 hours. Coagulation Profile: No results for input(s): INR, PROTIME in the last 168 hours. Cardiac Enzymes: No results for input(s): CKTOTAL, CKMB, CKMBINDEX, TROPONINI in the last 168 hours. BNP (last 3 results) No results for input(s): PROBNP in the last 8760 hours. HbA1C: No results for input(s): HGBA1C in the last 72  hours. CBG: Recent Labs  Lab 01/16/24 0624 01/17/24 0516  GLUCAP 105* 97   Lipid Profile: No results for input(s): CHOL, HDL, LDLCALC, TRIG, CHOLHDL, LDLDIRECT in the last 72 hours. Thyroid  Function Tests: No results for input(s): TSH, T4TOTAL, FREET4, T3FREE, THYROIDAB in the last 72 hours. Anemia Panel: No results for input(s): VITAMINB12, FOLATE, FERRITIN, TIBC, IRON, RETICCTPCT in the last 72 hours. Most Recent Urinalysis On File:     Component Value Date/Time   COLORURINE STRAW (A) 01/15/2024 1236   APPEARANCEUR CLEAR (A) 01/15/2024 1236   APPEARANCEUR Clear 11/04/2014 0845   LABSPEC 1.005 01/15/2024  1236   LABSPEC 1.025 08/11/2012 1358   PHURINE 8.0 01/15/2024 1236   GLUCOSEU NEGATIVE 01/15/2024 1236   GLUCOSEU Negative 08/11/2012 1358   HGBUR NEGATIVE 01/15/2024 1236   BILIRUBINUR NEGATIVE 01/15/2024 1236   BILIRUBINUR Negative 11/04/2014 0845   BILIRUBINUR Negative 08/11/2012 1358   KETONESUR NEGATIVE 01/15/2024 1236   PROTEINUR NEGATIVE 01/15/2024 1236   UROBILINOGEN 1.0 08/30/2007 2131   NITRITE NEGATIVE 01/15/2024 1236   LEUKOCYTESUR NEGATIVE 01/15/2024 1236   LEUKOCYTESUR Negative 08/11/2012 1358   Sepsis Labs: @LABRCNTIP (procalcitonin:4,lacticidven:4) Microbiology: No results found for this or any previous visit (from the past 240 hours).    Radiology Studies last 3 days: No results found.         Kaydance Bowie, DO [facility] 01/21/2024, 4:02 PM    Dictation software may have been used to generate the above note. Typos may occur and escape review in typed/dictated notes. Please contact Dr Marsa directly for clarity if needed.  Staff may message me via secure chat in Epic  but this may not receive an immediate response,  please page me for urgent matters!  If 7PM-7AM, please contact night coverage [email or web address]

## 2024-01-21 NOTE — Progress Notes (Signed)
 Physical Therapy Treatment Patient Details Name: [name] MRN: [id number] DOB: [birth year] Today's Date: 01/21/2024   History of Present Illness [name] is an 89 year old female with history of HTN, anxiety, leukopenia requiring transfusion (filgrastim ), who presents ED for chief concerns of syncope, mechanical fall, and R ankle pain. Imaging shows R mildly displaced and impacted fracture of the lateral malleolus.    PT Comments  Pt able to adhere to weight bearing precautions throughout PT session. Pt demonstrated understanding of HEP requiring min verbal/visual cuing for technique. HEP packet given. Upon standing to practice STS, pt reported need to use the [facility]. SPT assisted pt to [facility] and pt required min A for assist to [facility] for sequencing. Would benefit from skilled PT to address above deficits and promote optimal return to PLOF.    If plan is discharge home, recommend the following: A lot of help with walking and/or transfers;A lot of help with bathing/dressing/bathroom;Assistance with cooking/housework;Assist for transportation   Can travel by private vehicle     No  Equipment Recommendations  Rolling walker (2 wheels);BSC/3in1;Wheelchair cushion (measurements PT);Wheelchair (measurements PT);Other (comment)    Recommendations for Other Services       Precautions / Restrictions Precautions Precautions: Fall Recall of Precautions/Restrictions: Intact Required Braces or Orthoses: Other Brace Other Brace: CAM boot Restrictions Weight Bearing Restrictions Per Provider Order: Yes RLE Weight Bearing Per Provider Order: Non weight bearing Other Position/Activity Restrictions: CAM Boot to RLE     Mobility  Bed Mobility               General bed mobility comments: NT. Pt recieved in chair pre/post session.    Transfers Overall transfer level: Needs assistance Equipment used: Rolling walker (2 wheels) Transfers: Sit to/from Stand, Bed to  chair/wheelchair/BSC Sit to Stand: Min assist   Step pivot transfers: Min assist       General transfer comment: Verbal and tactile cuing for sequencing, foot placement, and forward weight shift to stand. SPT gave tactile cue under RLE to adhere to weight bearing precaution.    Ambulation/Gait               General Gait Details: NT d/t difficulty to maintain NWB order for RLE.   Stairs             Wheelchair Mobility     Tilt Bed    Modified Rankin (Stroke Patients Only)       Balance Overall balance assessment: Needs assistance Sitting-balance support: No upper extremity supported, Feet supported Sitting balance-Leahy Scale: Normal Sitting balance - Comments: Able to maintain sitting balance while performing strengthening exercises. No use of UEs for support.   Standing balance support: Bilateral upper extremity supported, During functional activity, Reliant on assistive device for balance Standing balance-Leahy Scale: Fair Standing balance comment: Pt able to maintain standing balance with use of RW. Verbal cues for posture. Demonstrates good postural control.                            Communication Communication Communication: No apparent difficulties  Cognition Arousal: Alert Behavior During Therapy: WFL for tasks assessed/performed   PT - Cognitive impairments: No apparent impairments                       PT - Cognition Comments: Pt is pleasant and agreeable to PT session. Following commands: Impaired Following commands impaired: Follows one step commands with increased time  Cueing Cueing Techniques: Verbal cues, Tactile cues, Visual cues  Exercises Total Joint Exercises Ankle Circles/Pumps: AROM, 5 reps Quad Sets: Strengthening, Both, 5 reps Gluteal Sets: Strengthening, 5 reps Hip ABduction/ADduction: Strengthening, 5 reps Long Arc Quad: Strengthening, 5 reps Other Exercises Other Exercises: Assisted pt to [facility]. Min  A for transfer. Verbal cuing for sequencing. Other Exercises: HEP packing given. Visual and tactile cuing for technique.    General Comments        Pertinent Vitals/Pain Pain Assessment Pain Assessment: No/denies pain    Home Living                          Prior Function            PT Goals (current goals can now be found in the care plan section) Acute Rehab PT Goals Patient Stated Goal: to get back home by myself PT Goal Formulation: With patient/family Time For Goal Achievement: 01/30/24 Potential to Achieve Goals: Good Progress towards PT goals: Progressing toward goals    Frequency    Min 2X/week      PT Plan      Co-evaluation              AM-PAC PT 6 Clicks Mobility   Outcome Measure  Help needed turning from your back to your side while in a flat bed without using bedrails?: None Help needed moving from lying on your back to sitting on the side of a flat bed without using bedrails?: None Help needed moving to and from a bed to a chair (including a wheelchair)?: A Little Help needed standing up from a chair using your arms (e.g., wheelchair or bedside chair)?: A Little Help needed to walk in hospital room?: A Lot Help needed climbing 3-5 steps with a railing? : A Lot 6 Click Score: 18    End of Session   Activity Tolerance: Patient tolerated treatment well Patient left: in chair;with call bell/phone within reach;with chair alarm set;with family/visitor present Nurse Communication: Mobility status PT Visit Diagnosis: Muscle weakness (generalized) (M62.81);History of falling (Z91.81);Difficulty in walking, not elsewhere classified (R26.2)     Time: 8668-8640 PT Time Calculation (min) (ACUTE ONLY): 28 min  Charges:    $Therapeutic Activity: 23-37 mins PT General Charges $$ ACUTE PT VISIT: 1 Visit                     Ruthanna Macchia, SPT    Keila Turan 01/21/2024, 4:47 PM

## 2024-01-21 NOTE — TOC Progression Note (Addendum)
 Transition of Care [facility]) - Progression Note    Patient Details  Name: [name] MRN: [id number] Date of Birth: 1935/03/01  Transition of Care [facility]) CM/SW Contact  [name] Fuse, RN Phone Number: 01/21/2024, 11:55 AM  Clinical Narrative:    TOC spoke with Britney from [facility], [facility] is unable to accept new patients at the time. TOC spoke with the patient's daughter [name] to make her aware. TOC share the patient has other bed offer from [facility], [facility], [facility], [facility], and [facility]. TOC reviewed the facility star ratings. TOC explained that medically the patient is ready for discharge, so a decision is needed sooner vs later. [name] is speaking with the patient's other family members and will f/u.  13:00: TOC received a message from [name], they choose [facility]. Nitchia to start ins auth. TOC called Tammy Ramsey at [facility] to accept the bed.  16:29: insurance auth received for [employer]. Approved [facility] PI:[id number] Dates:8/25-8/27/2025 Next Review Date:01/23/2024   Patient can transition tomorrow if medically appropriate. TOC will continue to follow                     Expected Discharge Plan and Services                                               Social Drivers of Health (SDOH) Interventions SDOH Screenings   Food Insecurity: No Food Insecurity (01/16/2024)  Housing: Low Risk  (01/16/2024)  Transportation Needs: No Transportation Needs (01/16/2024)  Utilities: Not At Risk (01/16/2024)  Social Connections: Socially Isolated (01/16/2024)  Tobacco Use: Medium Risk (01/15/2024)    Readmission Risk Interventions     No data to display

## 2024-01-21 NOTE — Progress Notes (Signed)
 Occupational Therapy Treatment Patient Details Name: [name] MRN: [id number] DOB: 1934/09/05 Today's Date: 01/21/2024   History of present illness [name] is an 89 year old female with history of HTN, anxiety, leukopenia requiring transfusion (filgrastim ), who presents ED for chief concerns of syncope, mechanical fall, and R ankle pain. Imaging shows R mildly displaced and impacted fracture of the lateral malleolus.   OT comments  Ms Rookstool was seen for OT treatment on this date. Upon arrival to room pt seated in chair, agreeable to tx. Pt requires MIN A + RW for chair>bed pivot t/f. MAX A don/doff CAM boot in sitting. Reviewed HEP. Continues to require cues and assist for NWBing. Pt making good progress toward goals, will continue to follow POC. Discharge recommendation remains appropriate.        If plan is discharge home, recommend the following:  A little help with walking and/or transfers;A little help with bathing/dressing/bathroom;Help with stairs or ramp for entrance;Assist for transportation;Assistance with cooking/housework   Equipment Recommendations  BSC/3in1    Recommendations for Other Services      Precautions / Restrictions Precautions Precautions: Fall Recall of Precautions/Restrictions: Intact Required Braces or Orthoses: Other Brace Other Brace: CAM boot Restrictions Weight Bearing Restrictions Per Provider Order: Yes RLE Weight Bearing Per Provider Order: Non weight bearing Other Position/Activity Restrictions: CAM Boot to RLE       Mobility Bed Mobility Overal bed mobility: Needs Assistance Bed Mobility: Sit to Supine       Sit to supine: Min assist        Transfers Overall transfer level: Needs assistance Equipment used: Rolling walker (2 wheels) Transfers: Sit to/from Stand, Bed to chair/wheelchair/BSC Sit to Stand: Min assist     Step pivot transfers: Min assist     General transfer comment: assist to maintain  NWB     Balance Overall balance assessment: Needs assistance Sitting-balance support: No upper extremity supported, Feet supported Sitting balance-Leahy Scale: Normal Sitting balance - Comments: Able to maintain sitting balance while performing strengthening exercises. No use of UEs for support.   Standing balance support: Bilateral upper extremity supported, During functional activity, Reliant on assistive device for balance Standing balance-Leahy Scale: Fair Standing balance comment: Pt able to maintain standing balance with use of RW. Verbal cues for posture. Demonstrates good postural control.                           ADL either performed or assessed with clinical judgement   ADL Overall ADL's : Needs assistance/impaired                                       General ADL Comments: MIN A + RW for simulated BSC t/f. MAX A don/doff CAM boot in sitting    Extremity/Trunk Assessment              Vision       Perception     Praxis     Communication Communication Communication: No apparent difficulties   Cognition Arousal: Alert Behavior During Therapy: WFL for tasks assessed/performed Cognition: Cognition impaired   Orientation impairments: Situation                           Following commands: Impaired Following commands impaired: Follows one step commands with increased time      Cueing  Cueing Techniques: Verbal cues, Tactile cues, Visual cues  Exercises      Shoulder Instructions       General Comments      Pertinent Vitals/ Pain       Pain Assessment Pain Assessment: Faces Faces Pain Scale: Hurts little more Pain Location: R foot/ankle Pain Descriptors / Indicators: Grimacing, Guarding Pain Intervention(s): Limited activity within patient's tolerance, Repositioned   Frequency  Min 2X/week        Progress Toward Goals  OT Goals(current goals can now be found in the care plan section)  Progress  towards OT goals: Progressing toward goals  Acute Rehab OT Goals OT Goal Formulation: With patient Time For Goal Achievement: 01/30/24 Potential to Achieve Goals: Good ADL Goals Pt Will Perform Grooming: sitting;with modified independence Pt Will Perform Lower Body Dressing: sit to/from stand;with adaptive equipment;with caregiver independent in assisting;with contact guard assist Pt Will Transfer to Toilet: bedside commode;stand pivot transfer;with supervision;with set-up Pt Will Perform Toileting - Clothing Manipulation and hygiene: sitting/lateral leans;sit to/from stand;with modified independence  Plan      Co-evaluation                 AM-PAC OT 6 Clicks Daily Activity     Outcome Measure   Help from another person eating meals?: None Help from another person taking care of personal grooming?: None Help from another person toileting, which includes using toliet, bedpan, or urinal?: A Little Help from another person bathing (including washing, rinsing, drying)?: A Little Help from another person to put on and taking off regular upper body clothing?: A Little Help from another person to put on and taking off regular lower body clothing?: A Little 6 Click Score: 20    End of Session Equipment Utilized During Treatment: Rolling walker (2 wheels)  OT Visit Diagnosis: Other abnormalities of gait and mobility (R26.89);Pain Pain - Right/Left: Right Pain - part of body: Leg;Ankle and joints of foot   Activity Tolerance Patient tolerated treatment well   Patient Left in bed;with call bell/phone within reach;with bed alarm set;with family/visitor present   Nurse Communication Mobility status        Time: 8481-8466 OT Time Calculation (min): 15 min  Charges: OT General Charges $OT Visit: 1 Visit OT Treatments $Self Care/Home Management : 8-22 mins  Elston Slot, M.S. OTR/L  01/21/24, 4:05 PM  ascom [phone]

## 2024-01-22 ENCOUNTER — Ambulatory Visit: Admitting: Internal Medicine

## 2024-01-22 ENCOUNTER — Inpatient Hospital Stay

## 2024-01-22 ENCOUNTER — Other Ambulatory Visit

## 2024-01-22 DIAGNOSIS — R55 Syncope and collapse: Secondary | ICD-10-CM | POA: Diagnosis not present

## 2024-01-22 MED ORDER — B-12 1000 MCG PO TBCR: 1000.0000 ug | EXTENDED_RELEASE_TABLET | Freq: Every day | ORAL | Status: AC

## 2024-01-22 MED ORDER — SENNOSIDES-DOCUSATE SODIUM 8.6-50 MG PO TABS: 2.0000 | ORAL_TABLET | Freq: Two times a day (BID) | ORAL | Status: AC | PRN

## 2024-01-22 MED ORDER — ACETAMINOPHEN 325 MG PO TABS: 650.0000 mg | ORAL_TABLET | Freq: Four times a day (QID) | ORAL | Status: AC | PRN

## 2024-01-22 MED ORDER — POLYETHYLENE GLYCOL 3350 17 G PO PACK: 17.0000 g | PACK | Freq: Every day | ORAL | Status: AC

## 2024-01-22 MED ORDER — TRAMADOL HCL 50 MG PO TABS: 50.0000 mg | ORAL_TABLET | Freq: Four times a day (QID) | ORAL | 0 refills | Status: AC | PRN

## 2024-01-22 MED ORDER — BISACODYL 10 MG RE SUPP: 10.0000 mg | Freq: Every day | RECTAL | Status: AC | PRN

## 2024-01-22 MED ORDER — ALPRAZOLAM 0.25 MG PO TABS: 0.2500 mg | ORAL_TABLET | Freq: Two times a day (BID) | ORAL | 0 refills | Status: AC | PRN

## 2024-01-22 NOTE — Progress Notes (Signed)
 Patient is being discharge to [facility] for rehab. Family will be the transportation to facility. IV removed. AVS and paper scripts placed in discharge packet for [facility]. Report called to [place] at [facility].

## 2024-01-22 NOTE — Care Management Obs Status (Signed)
 MEDICARE OBSERVATION STATUS NOTIFICATION   Patient Details  Name: [name] MRN: [id number] Date of Birth: Aug 31, 1934   Medicare Observation Status Notification Given:  Yes    Tani Virgo W, CMA 01/22/2024, 10:28 AM

## 2024-01-22 NOTE — Care Management Important Message (Signed)
 Important Message  Patient Details  Name: [name] MRN: [id number] Date of Birth: 11/02/1934   Important Message Given:  Yes - Medicare IM     Dalia GORMAN Fuse, RN 01/22/2024, 11:18 AM

## 2024-01-22 NOTE — Progress Notes (Signed)
 Physical Therapy Treatment Patient Details Name: [name] MRN: [id number] DOB: Oct 18, 1934 Today's Date: 01/22/2024   History of Present Illness [name] is an 89 year old female with history of HTN, anxiety, leukopenia requiring transfusion (filgrastim ), who presents ED for chief concerns of syncope, mechanical fall, and R ankle pain. Imaging shows R mildly displaced and impacted fracture of the lateral malleolus.    PT Comments  Pt awaiting potential transition to STR today, therefore focused session on mobility at edge of bed and education. Pt required Min/ModA for supine<>sit, good prolonged static and dynamic sitting balance at EOB for 20+ minutes and intermittent Supervision. Pt with donned Right CAM boot with minimal soreness this am. Education provided regarding current recommendations from Podiatry stating Ideally patient should be nonweightbearing to the lower extremity. If required okay for minimal weightbearing for transition purposes only. Pt currently struggles maintaining NWB in standing, therefore no progressive gait training pursued. Will continue to focus on strengthening, balance, and transfers acutely.    If plan is discharge home, recommend the following: A lot of help with walking and/or transfers;A lot of help with bathing/dressing/bathroom;Assistance with cooking/housework;Assist for transportation   Can travel by private vehicle     No  Equipment Recommendations  Rolling walker (2 wheels);BSC/3in1;Wheelchair cushion (measurements PT);Wheelchair (measurements PT);Other (comment)    Recommendations for Other Services       Precautions / Restrictions Precautions Precautions: Fall Recall of Precautions/Restrictions: Intact Precaution/Restrictions Comments:  (Pt able to recall NWB precautions on R LE) Required Braces or Orthoses: Other Brace Other Brace: CAM boot Restrictions Weight Bearing Restrictions Per Provider Order: Yes RLE Weight Bearing  Per Provider Order: Non weight bearing Other Position/Activity Restrictions: CAM Boot to RLE     Mobility  Bed Mobility Overal bed mobility: Needs Assistance Bed Mobility: Sit to Supine, Supine to Sit     Supine to sit: Min assist, Mod assist, Used rails, HOB elevated Sit to supine: Min assist   General bed mobility comments:  (Pt sat EOB for 20+ minutes while completing reaching dynamic tasks in sitting with intermittent Supervision)    Transfers Overall transfer level: Needs assistance Equipment used: Rolling walker (2 wheels) Transfers: Sit to/from Stand Sit to Stand: Min assist, From elevated surface           General transfer comment: assist to maintain NWB    Ambulation/Gait               General Gait Details: NT d/t difficulty to maintain NWB order for RLE.   Stairs             Wheelchair Mobility     Tilt Bed    Modified Rankin (Stroke Patients Only)       Balance Overall balance assessment: Needs assistance Sitting-balance support: No upper extremity supported, Feet supported Sitting balance-Leahy Scale: Normal Sitting balance - Comments:  (Sitting EOB with intermittent supervison)   Standing balance support: Bilateral upper extremity supported, During functional activity, Reliant on assistive device for balance Standing balance-Leahy Scale: Fair Standing balance comment: Pt able to maintain standing balance with use of RW. Verbal cues for posture. Demonstrates good postural control.                            Communication Communication Communication: No apparent difficulties  Cognition Arousal: Alert Behavior During Therapy: WFL for tasks assessed/performed   PT - Cognitive impairments: No apparent impairments  PT - Cognition Comments: Pt is pleasant and agreeable to PT session. Following commands: Intact      Cueing Cueing Techniques: Verbal cues, Tactile cues, Visual cues   Exercises Other Exercises Other Exercises: Pt educated on role of PT, reviewed wt bearing precautions and Podiatry MD states Ideally pt is to be NWB R LE with CAM boot on, however ok to place minimal wt for transfers only    General Comments General comments (skin integrity, edema, etc.):  (Ace wrap to Right lower leg with CAM boot properly donned)      Pertinent Vitals/Pain Pain Assessment Pain Assessment: Faces Faces Pain Scale: Hurts a little bit Pain Location: R foot/ankle Pain Descriptors / Indicators: Sore Pain Intervention(s): Limited activity within patient's tolerance    Home Living                          Prior Function            PT Goals (current goals can now be found in the care plan section) Acute Rehab PT Goals Patient Stated Goal: to get back home by myself Progress towards PT goals: Progressing toward goals    Frequency    Min 2X/week      PT Plan      Co-evaluation              AM-PAC PT 6 Clicks Mobility   Outcome Measure  Help needed turning from your back to your side while in a flat bed without using bedrails?: None Help needed moving from lying on your back to sitting on the side of a flat bed without using bedrails?: None Help needed moving to and from a bed to a chair (including a wheelchair)?: A Little Help needed standing up from a chair using your arms (e.g., wheelchair or bedside chair)?: A Little Help needed to walk in hospital room?: A Lot Help needed climbing 3-5 steps with a railing? : A Lot 6 Click Score: 18    End of Session Equipment Utilized During Treatment: Gait belt Activity Tolerance: Patient tolerated treatment well Patient left: in bed;with call bell/phone within reach;with family/visitor present Nurse Communication: Mobility status PT Visit Diagnosis: Muscle weakness (generalized) (M62.81);History of falling (Z91.81);Difficulty in walking, not elsewhere classified (R26.2)     Time:  9079-9056 PT Time Calculation (min) (ACUTE ONLY): 23 min  Charges:    $Therapeutic Activity: 23-37 mins PT General Charges $$ ACUTE PT VISIT: 1 Visit                    Darice Bohr, PTA  Darice JAYSON Bohr 01/22/2024, 10:25 AM

## 2024-01-22 NOTE — Care Management CC44 (Cosign Needed)
 Condition Code 44 Documentation Completed  Patient Details  Name: NYASIAH MOFFET MRN: [id number] Date of Birth: 09/19/1934   Condition Code 44 given:    Patient signature on Condition Code 44 notice:    Documentation of 2 MD's agreement:    Code 44 added to claim:       Dalia GORMAN Fuse, RN 01/22/2024, 11:18 AM

## 2024-01-22 NOTE — Discharge Summary (Signed)
 Physician Discharge Summary   Patient: [name] MRN: [id number]  DOB: 1935-05-11   Admit:     Date of Admission: 01/15/2024 Admitted from: home   Discharge: Date of discharge: 01/22/24 Disposition: Skilled nursing facility Condition at discharge: good  CODE STATUS: FULL CODE     Discharge Physician: Laneta Blunt, DO [facility]     PCP: Rudolpho Norleen BIRCH, MD  Recommendations for Outpatient Follow-up:  Follow up with PCP Rudolpho Norleen BIRCH, MD in 2-4 weeks Follow up with orthopedics in 2 weeks for Xrays        Discharge Diagnoses: Principal Problem:   Syncope Active Problems:   Temporal arteritis (HCC)   GERD   Hyperlipidemia   PAD (peripheral artery disease) (HCC)   HTN (hypertension)   Closed fracture of lateral malleolus   Anxiety      Hospital course / significant events:   HPI: [name] is a 89 year old female with history of htn, anxiety, leukopenia requiring transfusion (filgrastim ), who presents ED for chief concerns of syncope.  She reports that she does not know what happened yesterday.  While she noticed that she stood up and felt dizzy and then the next thing, she just was laying on the floor.  She reports this is never happened before.    08/19: to ED. VSS. CT head: No acute intracranial abnormality. Moderate cerebral white matter disease. X-ray does confirm mildly displaced and impacted R lateral malleolus fracture.  08/20: Echo no concerns. PT/OT recs for SNF given pt lives alone and NWB for now w/ fracture.  08/21-08/25: pending for SNF rehab 08/26: ok [facility]        Consultants:  none  Procedures/Surgeries: none      ASSESSMENT & PLAN:   Syncope Suspect orthostatic in setting of patient standing up and experiencing syncope Complete echo ordered - no concerns Fall precaution Reduced BP meds    Anxiety Home alprazolam  0.25 mg p.o. twice daily as needed for anxiety/sleep  resumed PDMP reviewed   Closed fracture of lateral malleolus Mildly displaced, impacted fracture Pain control fall precaution PT/OT  SNF rehab   Hx essential HTN (hypertension) Low diastolic pressure  Amlodipine  10 mg daily dc given orthostatic hypotension and fall/syncope --> systolic BP has been 120s-150s, diastolic has been 40s-50s   Temporal arteritis  chronic prednisone  5 mg daily  Constipation, chronic Daily miralax  Can add prn senokot tab +/- bisacodyl  suppository  Overweight based on BMI: Body mass index is 25.82 kg/m.SABRA Significantly low or high BMI is associated with higher medical risk.  Underweight - under 18  overweight - 25 to 29 obese - 30 or more Class 1 obesity: BMI of 30.0 to 34 Class 2 obesity: BMI of 35.0 to 39 Class 3 obesity: BMI of 40.0 to 49 Super Morbid Obesity: BMI 50-59 Super-super Morbid Obesity: BMI 60+ Healthy nutrition and physical activity advised as adjunct to other disease management and risk reduction treatments             Discharge Instructions  Allergies as of 01/22/2024       Reactions   Morphine  Other (See Comments)   done not work, makes me scream.   Benzalkonium Chloride Dermatitis   Other reaction(s): Unknown   Escitalopram    Deveioped a twitch   Hydralazine    Causes sx of heart pounding   Neomycin-bacitracin Zn-polymyx Dermatitis   Other reaction(s): Other (See Comments) REACTION: blisters skin REACTION: blisters skin REACTION:  blisters skin   Bacitracin-neomycin-polymyxin Dermatitis   REACTION: blisters skin        Medication List     STOP taking these medications    amLODipine  10 MG tablet Commonly known as: NORVASC    amoxicillin -clavulanate 875-125 MG tablet Commonly known as: AUGMENTIN    Apoaequorin 10 MG Caps   aspirin  EC 81 MG tablet   b complex vitamins capsule   Biotin  1000 MCG tablet   linaclotide  145 MCG Caps capsule Commonly known as: Linzess    ZINC OXIDE PO        TAKE these medications    acetaminophen  325 MG tablet Commonly known as: TYLENOL  Take 2 tablets (650 mg total) by mouth every 6 (six) hours as needed for mild pain (pain score 1-3), fever or headache.   ALPRAZolam  0.25 MG tablet Commonly known as: XANAX  Take 1 tablet (0.25 mg total) by mouth 2 (two) times daily as needed for anxiety. What changed: reasons to take this   B-12 1000 MCG Tbcr Take 1,000 mcg by mouth daily. What changed:  medication strength how much to take   bisacodyl  10 MG suppository Commonly known as: DULCOLAX Place 1 suppository (10 mg total) rectally daily as needed for severe constipation.   polyethylene glycol 17 g packet Commonly known as: MIRALAX  / GLYCOLAX  Take 17 g by mouth daily. Start taking on: January 23, 2024   predniSONE  5 MG tablet Commonly known as: DELTASONE  Take 5 mg by mouth daily.   senna-docusate 8.6-50 MG tablet Commonly known as: Senokot-S Take 2 tablets by mouth 2 (two) times daily as needed for moderate constipation.   traMADol  50 MG tablet Commonly known as: ULTRAM  Take 1 tablet (50 mg total) by mouth every 6 (six) hours as needed for severe pain (pain score 7-10) or moderate pain (pain score 4-6). What changed:  when to take this reasons to take this   vitamin C  1000 MG tablet Take 1,000 mg by mouth daily.   Vitamin D3 50 MCG (2000 UT) capsule Take 2,000 Units by mouth daily.          Allergies  Allergen Reactions   Morphine  Other (See Comments)    done not work, makes me scream.   Benzalkonium Chloride Dermatitis    Other reaction(s): Unknown   Escitalopram     Deveioped a twitch   Hydralazine     Causes sx of heart pounding   Neomycin-Bacitracin Zn-Polymyx Dermatitis    Other reaction(s): Other (See Comments) REACTION: blisters skin REACTION: blisters skin REACTION: blisters skin   Bacitracin-Neomycin-Polymyxin Dermatitis    REACTION: blisters skin     Subjective: pt feeling well this morning,  no concerns for discharge, no CP/SOB, tolerating diet, no urinary symptoms   Discharge Exam: BP (!) 143/47 (BP Location: Right Arm)   Pulse 81   Temp 98.8 F (37.1 C)   Resp 19   Ht 5' 6 (1.676 m)   Wt 67.7 kg   SpO2 97%   BMI 24.09 kg/m  General: Pt is alert, awake, not in acute distress Cardiovascular: RRR, S1/S2 +, no rubs, no gallops Respiratory: CTA bilaterally, no wheezing, no rhonchi Abdominal: Soft, NT, ND, bowel sounds + Extremities: no edema, no cyanosis     The results of significant diagnostics from this hospitalization (including imaging, microbiology, ancillary and laboratory) are listed below for reference.     Microbiology: No results found for this or any previous visit (from the past 240 hours).   Labs: BNP (last 3 results) No results  for input(s): BNP in the last 8760 hours. Basic Metabolic Panel: Recent Labs  Lab 01/16/24 0618  NA 142  K 3.7  CL 107  CO2 24  GLUCOSE 103*  BUN 13  CREATININE 0.92  CALCIUM 8.9   Liver Function Tests: No results for input(s): AST, ALT, ALKPHOS, BILITOT, PROT, ALBUMIN in the last 168 hours.  No results for input(s): LIPASE, AMYLASE in the last 168 hours. No results for input(s): AMMONIA in the last 168 hours. CBC: Recent Labs  Lab 01/16/24 0618  WBC 3.0*  HGB 13.4  HCT 41.9  MCV 91.1  PLT 220   Cardiac Enzymes: No results for input(s): CKTOTAL, CKMB, CKMBINDEX, TROPONINI in the last 168 hours. BNP: Invalid input(s): POCBNP CBG: Recent Labs  Lab 01/16/24 0624 01/17/24 0516  GLUCAP 105* 97   D-Dimer No results for input(s): DDIMER in the last 72 hours. Hgb A1c No results for input(s): HGBA1C in the last 72 hours. Lipid Profile No results for input(s): CHOL, HDL, LDLCALC, TRIG, CHOLHDL, LDLDIRECT in the last 72 hours. Thyroid  function studies No results for input(s): TSH, T4TOTAL, T3FREE, THYROIDAB in the last 72 hours.  Invalid  input(s): FREET3 Anemia work up No results for input(s): VITAMINB12, FOLATE, FERRITIN, TIBC, IRON, RETICCTPCT in the last 72 hours. Urinalysis    Component Value Date/Time   COLORURINE YELLOW (A) 01/21/2024 1640   APPEARANCEUR CLEAR (A) 01/21/2024 1640   APPEARANCEUR Clear 11/04/2014 0845   LABSPEC 1.008 01/21/2024 1640   LABSPEC 1.025 08/11/2012 1358   PHURINE 5.0 01/21/2024 1640   GLUCOSEU NEGATIVE 01/21/2024 1640   GLUCOSEU Negative 08/11/2012 1358   HGBUR NEGATIVE 01/21/2024 1640   BILIRUBINUR NEGATIVE 01/21/2024 1640   BILIRUBINUR Negative 11/04/2014 0845   BILIRUBINUR Negative 08/11/2012 1358   KETONESUR NEGATIVE 01/21/2024 1640   PROTEINUR NEGATIVE 01/21/2024 1640   UROBILINOGEN 1.0 08/30/2007 2131   NITRITE NEGATIVE 01/21/2024 1640   LEUKOCYTESUR NEGATIVE 01/21/2024 1640   LEUKOCYTESUR Negative 08/11/2012 1358   Sepsis Labs Recent Labs  Lab 01/16/24 0618  WBC 3.0*   Microbiology No results found for this or any previous visit (from the past 240 hours). Imaging ECHOCARDIOGRAM COMPLETE Result Date: 01/16/2024    ECHOCARDIOGRAM REPORT   Patient Name:   JOHANN GASCOIGNE [facility] Date of Exam: 01/15/2024 Medical Rec #:  [id number]           Height:       66.0 in Accession #:    [id number]          Weight:       160.0 lb Date of Birth:  01/13/1935           BSA:          1.819 m Patient Age:    89 years            BP:           150/52 mmHg Patient Gender: F                   HR:           71 bpm. Exam Location:  [facility] Procedure: 2D Echo, Cardiac Doppler and Color Doppler (Both Spectral and Color            Flow Doppler were utilized during procedure). Indications:     Dyspnea R06.00                  Syncope R55  History:  Patient has no prior history of Echocardiogram examinations.                  Risk Factors:Hypertension and Dyslipidemia.  Sonographer:     Christopher Furnace Referring Phys:  [id number] AMY N COX Diagnosing Phys: Cara JONETTA Lovelace MD  Sonographer Comments:  Image acquisition challenging due to breast implants. IMPRESSIONS  1. Left ventricular ejection fraction, by estimation, is 55 to 60%. The left ventricle has normal function. The left ventricle has no regional wall motion abnormalities. There is mild asymmetric left ventricular hypertrophy of the basal segment. Left ventricular diastolic parameters are consistent with Grade I diastolic dysfunction (impaired relaxation).  2. Right ventricular systolic function is normal. The right ventricular size is normal.  3. The mitral valve is grossly normal. Mild mitral valve regurgitation.  4. The aortic valve is normal in structure. Aortic valve regurgitation is mild. Aortic valve sclerosis is present, with no evidence of aortic valve stenosis. Conclusion(s)/Recommendation(s): Poor windows for evaluation of left ventricular function by transthoracic echocardiography. Would recommend an alternative means of evaluation. FINDINGS  Left Ventricle: Left ventricular ejection fraction, by estimation, is 55 to 60%. The left ventricle has normal function. The left ventricle has no regional wall motion abnormalities. Strain was performed and the global longitudinal strain is indeterminate. The left ventricular internal cavity size was normal in size. There is mild asymmetric left ventricular hypertrophy of the basal segment. Left ventricular diastolic parameters are consistent with Grade I diastolic dysfunction (impaired relaxation). Right Ventricle: The right ventricular size is normal. No increase in right ventricular wall thickness. Right ventricular systolic function is normal. Left Atrium: Left atrial size was normal in size. Right Atrium: Right atrial size was normal in size. Pericardium: There is no evidence of pericardial effusion. Mitral Valve: The mitral valve is grossly normal. There is mild thickening of the mitral valve leaflet(s). There is mild calcification of the mitral valve leaflet(s). Mild mitral valve regurgitation.  MV peak gradient, 16.0 mmHg. The mean mitral valve gradient is 8.0 mmHg. Tricuspid Valve: The tricuspid valve is normal in structure. Tricuspid valve regurgitation is mild. Aortic Valve: The aortic valve is normal in structure. Aortic valve regurgitation is mild. Aortic valve sclerosis is present, with no evidence of aortic valve stenosis. Aortic valve mean gradient measures 4.5 mmHg. Aortic valve peak gradient measures 7.2  mmHg. Aortic valve area, by VTI measures 3.30 cm. Pulmonic Valve: The pulmonic valve was normal in structure. Pulmonic valve regurgitation is not visualized. Aorta: The ascending aorta was not well visualized. IAS/Shunts: No atrial level shunt detected by color flow Doppler. Additional Comments: 3D was performed not requiring image post processing on an independent workstation and was indeterminate.  LEFT VENTRICLE PLAX 2D LVIDd:         3.20 cm   Diastology LVIDs:         2.30 cm   LV e' medial:    7.94 cm/s LV PW:         1.30 cm   LV E/e' medial:  15.9 LV IVS:        1.10 cm   LV e' lateral:   5.87 cm/s LVOT diam:     2.00 cm   LV E/e' lateral: 21.5 LV SV:         91 LV SV Index:   50 LVOT Area:     3.14 cm  RIGHT VENTRICLE RV Basal diam:  2.70 cm RV Mid diam:    2.40 cm RV S prime:  15.00 cm/s TAPSE (M-mode): 1.7 cm LEFT ATRIUM             Index        RIGHT ATRIUM           Index LA diam:        2.90 cm 1.59 cm/m   RA Area:     11.80 cm LA Vol (A2C):   55.0 ml 30.24 ml/m  RA Volume:   25.30 ml  13.91 ml/m LA Vol (A4C):   30.5 ml 16.77 ml/m LA Biplane Vol: 45.7 ml 25.13 ml/m  AORTIC VALVE AV Area (Vmax):    2.84 cm AV Area (Vmean):   2.77 cm AV Area (VTI):     3.30 cm AV Vmax:           134.00 cm/s AV Vmean:          97.200 cm/s AV VTI:            0.275 m AV Peak Grad:      7.2 mmHg AV Mean Grad:      4.5 mmHg LVOT Vmax:         121.00 cm/s LVOT Vmean:        85.600 cm/s LVOT VTI:          0.289 m LVOT/AV VTI ratio: 1.05  AORTA Ao Root diam: 3.00 cm MITRAL VALVE                 TRICUSPID VALVE MV Area (PHT): 1.97 cm     TR Peak grad:   16.2 mmHg MV Area VTI:   1.83 cm     TR Vmax:        201.00 cm/s MV Peak grad:  16.0 mmHg MV Mean grad:  8.0 mmHg     SHUNTS MV Vmax:       2.00 m/s     Systemic VTI:  0.29 m MV Vmean:      132.0 cm/s   Systemic Diam: 2.00 cm MV Decel Time: 385 msec MV E velocity: 126.00 cm/s MV A velocity: 164.00 cm/s MV E/A ratio:  0.77 Cara JONETTA Lovelace MD Electronically signed by Cara JONETTA Lovelace MD Signature Date/Time: 01/16/2024/7:58:04 AM    Final    Portable Chest 1 View Result Date: 01/15/2024 CLINICAL DATA:  Syncope. Loss of consciousness with resulting right ankle injury. EXAM: PORTABLE CHEST 1 VIEW COMPARISON:  Radiographs 04/11/2023 and 09/21/2017.  CT 09/21/2017. FINDINGS: 1315 hours. The heart size and mediastinal contours are stable with mild aortic atherosclerosis. There is stable mild scarring at both lung apices. The lungs are otherwise clear. No pleural effusion or pneumothorax. No acute osseous findings are evident. Bilateral breast implants are noted. IMPRESSION: No evidence of acute cardiopulmonary process. Aortic atherosclerosis. Electronically Signed   By: Elsie Perone M.D.   On: 01/15/2024 13:36   CT Cervical Spine Wo Contrast Result Date: 01/15/2024 CLINICAL DATA:  Neck trauma.  Syncopal episode yesterday with fall. EXAM: CT CERVICAL SPINE WITHOUT CONTRAST TECHNIQUE: Multidetector CT imaging of the cervical spine was performed without intravenous contrast. Multiplanar CT image reconstructions were also generated. RADIATION DOSE REDUCTION: This exam was performed according to the departmental dose-optimization program which includes automated exposure control, adjustment of the mA and/or kV according to patient size and/or use of iterative reconstruction technique. COMPARISON:  None Available. FINDINGS: Alignment: Normal. Skull base and vertebrae: No evidence of acute fracture or traumatic subluxation. Soft tissues and spinal canal: No  prevertebral fluid or swelling. No visible  canal hematoma. Disc levels: Mild-to-moderate multilevel spondylosis for age with disc space narrowing, endplate osteophytes, uncinate spurring and facet hypertrophy. No large disc herniation identified. Up to moderate osseous foraminal narrowing which appears worst at C5-6 and C6-7. Upper chest: Mild biapical scarring. Aortic and carotid atherosclerosis. Other: Bilateral TMJ degenerative changes. IMPRESSION: 1. No evidence of acute cervical spine fracture, traumatic subluxation or static signs of instability. 2. Mild-to-moderate multilevel cervical spondylosis for age. 3.  Aortic Atherosclerosis (ICD10-I70.0). Electronically Signed   By: Elsie Perone M.D.   On: 01/15/2024 11:23   CT HEAD WO CONTRAST ( ) Result Date: 01/15/2024 EXAM: CT HEAD WITHOUT CONTRAST 01/15/2024 11:09:07 AM TECHNIQUE: CT of the head was performed without the administration of intravenous contrast. Automated exposure control, iterative reconstruction, and/or weight based adjustment of the mA/kV was utilized to reduce the radiation dose to as low as reasonably achievable. COMPARISON: CT of the head dated 09/21/2017. CLINICAL HISTORY: Head trauma, minor (Age >= 65y). Pt to ED from with family member for R ankle injury from syncopal episode yesterday at 5pm when she was getting up out of a chair. Both lower legs have bronze discoloration, but R lower leg is red and swollen. Painful to bear weight. Also daughter in law mentions some decline in cognitive function and mental status since last few months. Pt is alert and oriented. FINDINGS: BRAIN AND VENTRICLES: No acute hemorrhage. No mass. No acute cortical infarct. No hydrocephalus. Moderate cerebral white matter disease. ORBITS: Patient is status post bilateral lens surgery. The patient is status post bilateral lens replacement. SINUSES: Mild mucosal disease within the right ethmoid air cells and the sphenoid sinus. SOFT TISSUES AND SKULL: No  acute soft tissue abnormality. No skull fracture. IMPRESSION: 1. No acute intracranial abnormality. 2. Moderate cerebral white matter disease. Electronically signed by: Evalene Coho MD 01/15/2024 11:21 AM EDT RP Workstation: [id number]   DG Tibia/Fibula Right Result Date: 01/15/2024 CLINICAL DATA:  fall.  Pain in the ankle and foot. EXAM: RIGHT FOOT COMPLETE - 3+ VIEW; RIGHT TIBIA AND FIBULA - 2 VIEW COMPARISON:  None Available. FINDINGS: There is mildly displaced and impacted fracture of the lateral malleolus with mild-to-moderate overlying soft tissue swelling. No other acute fracture or dislocation. No aggressive osseous lesion. Ankle mortise appears intact. Mild degenerative changes of the imaged joints. No focal soft tissue swelling. No radiopaque foreign bodies. IMPRESSION: *Mildly displaced and impacted fracture of the lateral malleolus. Electronically Signed   By: Ree Molt M.D.   On: 01/15/2024 10:44   DG Foot Complete Right Result Date: 01/15/2024 CLINICAL DATA:  fall.  Pain in the ankle and foot. EXAM: RIGHT FOOT COMPLETE - 3+ VIEW; RIGHT TIBIA AND FIBULA - 2 VIEW COMPARISON:  None Available. FINDINGS: There is mildly displaced and impacted fracture of the lateral malleolus with mild-to-moderate overlying soft tissue swelling. No other acute fracture or dislocation. No aggressive osseous lesion. Ankle mortise appears intact. Mild degenerative changes of the imaged joints. No focal soft tissue swelling. No radiopaque foreign bodies. IMPRESSION: *Mildly displaced and impacted fracture of the lateral malleolus. Electronically Signed   By: Ree Molt M.D.   On: 01/15/2024 10:44      Time coordinating discharge: over 30 minutes  SIGNED:  Hydia Copelin DO [facility]

## 2024-01-22 NOTE — TOC Transition Note (Signed)
 Transition of Care [facility]) - Discharge Note   Patient Details  Name: [name] MRN: [id number] Date of Birth: February 17, 1935  Transition of Care [facility]) CM/SW Contact:  Dalia GORMAN Fuse, RN Phone Number: 01/22/2024, 12:36 PM   Clinical Narrative:    The patient is medically clear to discharge to [facility]. The patient and family are agreeable with the discharge plan. The family will transport.    Final next level of care: Skilled Nursing Facility Barriers to Discharge: Barriers Resolved   Patient Goals and CMS Choice     Choice offered to / list presented to : Adult Children      Discharge Placement              Patient chooses bed at: [facility] [place] Patient to be transferred to facility by: family Name of family member notified: Jon    Discharge Plan and Services Additional resources added to the After Visit Summary for                                       Social Drivers of Health (SDOH) Interventions SDOH Screenings   Food Insecurity: No Food Insecurity (01/16/2024)  Housing: Low Risk  (01/16/2024)  Transportation Needs: No Transportation Needs (01/16/2024)  Utilities: Not At Risk (01/16/2024)  Social Connections: Socially Isolated (01/16/2024)  Tobacco Use: Medium Risk (01/15/2024)     Readmission Risk Interventions     No data to display

## 2024-02-12 ENCOUNTER — Ambulatory Visit (INDEPENDENT_AMBULATORY_CARE_PROVIDER_SITE_OTHER)

## 2024-02-12 ENCOUNTER — Ambulatory Visit: Admitting: Podiatry

## 2024-02-12 DIAGNOSIS — S82831D Other fracture of upper and lower end of right fibula, subsequent encounter for closed fracture with routine healing: Secondary | ICD-10-CM

## 2024-02-12 NOTE — Progress Notes (Signed)
 Chief Complaint  Patient presents with   Foot Pain    Right distal FIB fracture    HPI: 89 y.o. female presenting today for outpatient follow-up after syncope episode and fall injury.  Patient was admitted 01/15/2024.  Diagnosed with mildly displaced fibular fracture right.  Subsequently discharged and residing with her daughter  Past Medical History:  Diagnosis Date   Breast cancer [facility]) 1985   bilateral mastectomies   Depression    1 time specific situation that she did not know how to deal wiht   Diverticulosis    Hiatal hernia    History of Bell's palsy    History of colon polyps    History of COVID-19    History of nephrolithiasis    History of pancreatitis    Hyperlipidemia    Hypertension, essential 01/10/2023   IBS (irritable bowel syndrome)    Osteoporosis    osteopenia in neck   Skin cancer    Temporal arteritis (HCC)     Past Surgical History:  Procedure Laterality Date   AUGMENTATION MAMMAPLASTY Bilateral 2000   COLON RESECTION SIGMOID N/A 10/13/2017   Procedure: COLON RESECTION SIGMOID;  Surgeon: Rodolph Romano, MD;  Location: [facility] ORS;  Service: General;  Laterality: N/A;   COLOSTOMY N/A 10/13/2017   Procedure: COLOSTOMY;  Surgeon: Rodolph Romano, MD;  Location: [facility] ORS;  Service: General;  Laterality: N/A;   LAPAROSCOPIC CHOLECYSTECTOMY  2009   MASTECTOMY  1983   bilateral    Allergies  Allergen Reactions   Morphine  Other (See Comments)    done not work, makes me scream.   Benzalkonium Chloride Dermatitis    Other reaction(s): Unknown   Escitalopram     Deveioped a twitch   Hydralazine     Causes sx of heart pounding   Neomycin-Bacitracin Zn-Polymyx Dermatitis    Other reaction(s): Other (See Comments) REACTION: blisters skin REACTION: blisters skin REACTION: blisters skin   Bacitracin-Neomycin-Polymyxin Dermatitis    REACTION: blisters skin     Physical Exam: General: The patient is alert and oriented x3 in no acute  distress.  Dermatology: Skin is warm, dry and supple bilateral lower extremities.  No open lacerations  Vascular: Palpable pedal pulses bilaterally. Capillary refill within normal limits.  No appreciable edema.  No erythema.  Neurological: Grossly intact via light touch  Musculoskeletal Exam: Foot is in good alignment in relationship to the leg.  No structural deformity or malalignment noted.  DG Tibia/Fibula Right 01/15/2024 IMPRESSION: *Mildly displaced and impacted fracture of the lateral malleolus.  Radiographic Exam RT ankle 02/12/2024:  Mostly unchanged.  Stability of the ankle fracture noted.  Assessment/Plan of Care: 1. Closed fracture of right ankle; DOI: 01/14/2024  -Patient evaluated today.  X-rays reviewed -Continue immobilization in the cam boot.  -It is unrealistic to expect the patient given her age to be strictly nonweightbearing to the right ankle.  Okay for weightbearing as tolerated in the cam boot for bathroom privileges and transition purposes from the bed to the recliner with the assistance of a walker.  Also okay for minimal ambulation short distances only -Plan for continued conservative/nonsurgical care -Continue home physical therapy weekly.  Please see today's note for weightbearing status -Return to clinic 4 weeks follow-up x-ray and to begin full weightbearing       Thresa EMERSON Sar, DPM [facility]  Dr. Thresa EMERSON Sar, DPM    [address]  [place], [place] [address]                Office [phone]  Fax [phone]

## 2024-03-11 ENCOUNTER — Ambulatory Visit (INDEPENDENT_AMBULATORY_CARE_PROVIDER_SITE_OTHER)

## 2024-03-11 ENCOUNTER — Ambulatory Visit: Admitting: Podiatry

## 2024-03-11 DIAGNOSIS — S82831D Other fracture of upper and lower end of right fibula, subsequent encounter for closed fracture with routine healing: Secondary | ICD-10-CM

## 2024-03-11 NOTE — Progress Notes (Signed)
 Chief Complaint  Patient presents with   Routine Post Op    She has no complaints regarding her right ankle. She does mention some pain with her 2nd toe on the left foot.    HPI: 89 y.o. female presenting today for outpatient follow-up after syncope episode and fall injury.  Patient was admitted 01/15/2024.  Diagnosed with mildly displaced fibular fracture right.  Subsequently discharged and residing with her daughter.  She is doing well.  She is currently weightbearing without any pain or alteration of gait.  She says that her balance feels well  Past Medical History:  Diagnosis Date   Breast cancer (HCC) 1985   bilateral mastectomies   Depression    1 time specific situation that she did not know how to deal wiht   Diverticulosis    Hiatal hernia    History of Bell's palsy    History of colon polyps    History of COVID-19    History of nephrolithiasis    History of pancreatitis    Hyperlipidemia    Hypertension, essential 01/10/2023   IBS (irritable bowel syndrome)    Osteoporosis    osteopenia in neck   Skin cancer    Temporal arteritis (HCC)     Past Surgical History:  Procedure Laterality Date   AUGMENTATION MAMMAPLASTY Bilateral 2000   COLON RESECTION SIGMOID N/A 10/13/2017   Procedure: COLON RESECTION SIGMOID;  Surgeon: Rodolph Romano, MD;  Location: [facility] ORS;  Service: General;  Laterality: N/A;   COLOSTOMY N/A 10/13/2017   Procedure: COLOSTOMY;  Surgeon: Rodolph Romano, MD;  Location: [facility] ORS;  Service: General;  Laterality: N/A;   LAPAROSCOPIC CHOLECYSTECTOMY  2009   MASTECTOMY  1983   bilateral    Allergies  Allergen Reactions   Morphine  Other (See Comments)    done not work, makes me scream.   Benzalkonium Chloride Dermatitis    Other reaction(s): Unknown   Escitalopram     Deveioped a twitch   Hydralazine     Causes sx of heart pounding   Neomycin-Bacitracin Zn-Polymyx Dermatitis    Other reaction(s): Other (See Comments) REACTION:  blisters skin REACTION: blisters skin REACTION: blisters skin   Bacitracin-Neomycin-Polymyxin Dermatitis    REACTION: blisters skin     Physical Exam: General: The patient is alert and oriented x3 in no acute distress.  Dermatology: Skin is warm, dry and supple bilateral lower extremities.  No open lacerations  Vascular: Palpable pedal pulses bilaterally. Capillary refill within normal limits.  No appreciable edema.  No erythema.  Neurological: Grossly intact via light touch  Musculoskeletal Exam: Foot is in good alignment in relationship to the leg.  No structural deformity or malalignment noted.  Muscle strength 5/5 all compartments.  Good range of motion noted  DG Tibia/Fibula Right 01/15/2024 IMPRESSION: *Mildly displaced and impacted fracture of the lateral malleolus.  Radiographic Exam RT ankle 03/11/2024:  Mostly unchanged.  Stability of the ankle fracture noted.  Assessment/Plan of Care: 1. Closed fracture of right ankle; DOI: 01/14/2024  -Patient evaluated today.  X-rays reviewed - Okay to discontinue cam boot with the assistance of a walker.  Patient states that she is ambulating just fine in the cam boot without pain or instability. -Once the patient is comfortable ambulating in tennis shoes she may discontinue the walker and get back to normal activity -Return to clinic PRN      Thresa EMERSON Sar, DPM [facility]  Dr. Thresa EMERSON Sar, DPM    [address] [place], [place] [address]                Office [phone]  Fax [phone]

## 2024-03-25 ENCOUNTER — Ambulatory Visit: Admitting: Podiatry

## 2024-03-26 ENCOUNTER — Telehealth: Payer: Self-pay

## 2024-03-26 NOTE — Telephone Encounter (Signed)
 PERRI SLOUGH (Daughter) [phone], called and left message to call her back to get an appt set up with Dr. B for her mom.

## 2024-04-01 ENCOUNTER — Other Ambulatory Visit: Payer: Self-pay | Admitting: *Deleted

## 2024-04-01 DIAGNOSIS — D708 Other neutropenia: Secondary | ICD-10-CM

## 2024-04-01 DIAGNOSIS — D72819 Decreased white blood cell count, unspecified: Secondary | ICD-10-CM

## 2024-04-02 ENCOUNTER — Inpatient Hospital Stay (HOSPITAL_BASED_OUTPATIENT_CLINIC_OR_DEPARTMENT_OTHER): Admitting: Internal Medicine

## 2024-04-02 ENCOUNTER — Inpatient Hospital Stay

## 2024-04-02 ENCOUNTER — Encounter: Payer: Self-pay | Admitting: Internal Medicine

## 2024-04-02 ENCOUNTER — Inpatient Hospital Stay: Attending: Internal Medicine

## 2024-04-02 VITALS — BP 136/52 | HR 83 | Temp 96.4°F | Resp 18 | Ht 66.0 in | Wt 146.0 lb

## 2024-04-02 DIAGNOSIS — Z604 Social exclusion and rejection: Secondary | ICD-10-CM | POA: Insufficient documentation

## 2024-04-02 DIAGNOSIS — Z885 Allergy status to narcotic agent status: Secondary | ICD-10-CM | POA: Insufficient documentation

## 2024-04-02 DIAGNOSIS — M810 Age-related osteoporosis without current pathological fracture: Secondary | ICD-10-CM | POA: Diagnosis not present

## 2024-04-02 DIAGNOSIS — Z87442 Personal history of urinary calculi: Secondary | ICD-10-CM | POA: Diagnosis not present

## 2024-04-02 DIAGNOSIS — Z860100 Personal history of colon polyps, unspecified: Secondary | ICD-10-CM | POA: Insufficient documentation

## 2024-04-02 DIAGNOSIS — I10 Essential (primary) hypertension: Secondary | ICD-10-CM | POA: Diagnosis not present

## 2024-04-02 DIAGNOSIS — Z881 Allergy status to other antibiotic agents status: Secondary | ICD-10-CM | POA: Insufficient documentation

## 2024-04-02 DIAGNOSIS — E785 Hyperlipidemia, unspecified: Secondary | ICD-10-CM | POA: Diagnosis not present

## 2024-04-02 DIAGNOSIS — Z87891 Personal history of nicotine dependence: Secondary | ICD-10-CM | POA: Insufficient documentation

## 2024-04-02 DIAGNOSIS — Z9013 Acquired absence of bilateral breasts and nipples: Secondary | ICD-10-CM | POA: Insufficient documentation

## 2024-04-02 DIAGNOSIS — Z79899 Other long term (current) drug therapy: Secondary | ICD-10-CM | POA: Insufficient documentation

## 2024-04-02 DIAGNOSIS — Z8719 Personal history of other diseases of the digestive system: Secondary | ICD-10-CM | POA: Diagnosis not present

## 2024-04-02 DIAGNOSIS — S8010XA Contusion of unspecified lower leg, initial encounter: Secondary | ICD-10-CM | POA: Diagnosis not present

## 2024-04-02 DIAGNOSIS — M79676 Pain in unspecified toe(s): Secondary | ICD-10-CM | POA: Insufficient documentation

## 2024-04-02 DIAGNOSIS — Z933 Colostomy status: Secondary | ICD-10-CM | POA: Insufficient documentation

## 2024-04-02 DIAGNOSIS — Z811 Family history of alcohol abuse and dependence: Secondary | ICD-10-CM | POA: Diagnosis not present

## 2024-04-02 DIAGNOSIS — Z85828 Personal history of other malignant neoplasm of skin: Secondary | ICD-10-CM | POA: Diagnosis not present

## 2024-04-02 DIAGNOSIS — M316 Other giant cell arteritis: Secondary | ICD-10-CM | POA: Diagnosis not present

## 2024-04-02 DIAGNOSIS — Z8616 Personal history of COVID-19: Secondary | ICD-10-CM | POA: Diagnosis not present

## 2024-04-02 DIAGNOSIS — D692 Other nonthrombocytopenic purpura: Secondary | ICD-10-CM | POA: Diagnosis not present

## 2024-04-02 DIAGNOSIS — D708 Other neutropenia: Secondary | ICD-10-CM

## 2024-04-02 DIAGNOSIS — Z800 Family history of malignant neoplasm of digestive organs: Secondary | ICD-10-CM | POA: Insufficient documentation

## 2024-04-02 DIAGNOSIS — Z9049 Acquired absence of other specified parts of digestive tract: Secondary | ICD-10-CM | POA: Diagnosis not present

## 2024-04-02 DIAGNOSIS — Z840 Family history of diseases of the skin and subcutaneous tissue: Secondary | ICD-10-CM | POA: Insufficient documentation

## 2024-04-02 DIAGNOSIS — Z6372 Alcoholism and drug addiction in family: Secondary | ICD-10-CM | POA: Diagnosis not present

## 2024-04-02 DIAGNOSIS — Z853 Personal history of malignant neoplasm of breast: Secondary | ICD-10-CM | POA: Insufficient documentation

## 2024-04-02 DIAGNOSIS — D72819 Decreased white blood cell count, unspecified: Secondary | ICD-10-CM

## 2024-04-02 LAB — CBC WITH DIFFERENTIAL (CANCER CENTER ONLY)
Abs Immature Granulocytes: 0.05 K/uL (ref 0.00–0.07)
Basophils Absolute: 0 K/uL (ref 0.0–0.1)
Basophils Relative: 0 %
Eosinophils Absolute: 0 K/uL (ref 0.0–0.5)
Eosinophils Relative: 0 %
HCT: 47.6 % — ABNORMAL HIGH (ref 36.0–46.0)
Hemoglobin: 14.9 g/dL (ref 12.0–15.0)
Immature Granulocytes: 2 %
Lymphocytes Relative: 39 %
Lymphs Abs: 1.2 K/uL (ref 0.7–4.0)
MCH: 28.1 pg (ref 26.0–34.0)
MCHC: 31.3 g/dL (ref 30.0–36.0)
MCV: 89.6 fL (ref 80.0–100.0)
Monocytes Absolute: 0.3 K/uL (ref 0.1–1.0)
Monocytes Relative: 9 %
Neutro Abs: 1.6 K/uL — ABNORMAL LOW (ref 1.7–7.7)
Neutrophils Relative %: 50 %
Platelet Count: 391 K/uL (ref 150–400)
RBC: 5.31 MIL/uL — ABNORMAL HIGH (ref 3.87–5.11)
RDW: 15.5 % (ref 11.5–15.5)
WBC Count: 3.2 K/uL — ABNORMAL LOW (ref 4.0–10.5)
nRBC: 0 % (ref 0.0–0.2)

## 2024-04-02 LAB — VITAMIN B12: Vitamin B-12: 3433 pg/mL — ABNORMAL HIGH (ref 180–914)

## 2024-04-02 LAB — BASIC METABOLIC PANEL - CANCER CENTER ONLY
Anion gap: 12 (ref 5–15)
BUN: 28 mg/dL — ABNORMAL HIGH (ref 8–23)
CO2: 27 mmol/L (ref 22–32)
Calcium: 9.4 mg/dL (ref 8.9–10.3)
Chloride: 98 mmol/L (ref 98–111)
Creatinine: 1.4 mg/dL — ABNORMAL HIGH (ref 0.44–1.00)
GFR, Estimated: 36 mL/min — ABNORMAL LOW (ref >60)
Glucose, Bld: 106 mg/dL — ABNORMAL HIGH (ref 70–99)
Potassium: 4.1 mmol/L (ref 3.5–5.1)
Sodium: 137 mmol/L (ref 135–145)

## 2024-04-02 MED ORDER — FILGRASTIM-SNDZ 480 MCG/0.8ML IJ SOSY: 480.0000 ug | PREFILLED_SYRINGE | Freq: Once | INTRAMUSCULAR | Status: DC

## 2024-04-02 NOTE — Addendum Note (Signed)
 Addended by: JOSHUA ALFONSO CROME on: 04/02/2024 03:27 PM   Modules accepted: Orders

## 2024-04-02 NOTE — Progress Notes (Signed)
 Anc at 1.6; hold Zarixo today

## 2024-04-02 NOTE — Progress Notes (Signed)
 [facility] CONSULT NOTE  Patient Care Team: Rudolpho Norleen BIRCH, MD as PCP - General (Internal Medicine) Maryl Zachary LELON Mickey., MD (Rheumatology) Dasher, Alm LABOR, MD (Dermatology) Rennie Tonya SAUNDERS, MD as Consulting Physician (Oncology) Milissa Hamming, MD as Consulting Physician (Otolaryngology)  CHIEF COMPLAINTS/PURPOSE OF CONSULTATION: Autoimmune neutropenia  #April 2019- Autoimmune neutropenia-weekly Granix [facility] [Dr.Crocoran]; BMBx- no malignancy [Bone marrow aspirate and biopsy on 09/04/2017 revealed a hypercellular marrow for age with trilineage hematopoiesis.  There was abundant mature neutrophils.  Significant dyspoiesis or increased blasts was not identified.  Flow cytometry revealed a predominance of T lymphocytes (16% of all cells) with no abnormal phenotype.  There was a minor B-cell population (13% of lymphocytes) with slight kappa light chain excess.  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    HISTORY OF PRESENTING ILLNESS: Alone.  Ambulating independently.  Discussed the use of AI scribe software for clinical note transcription with the patient, who gave verbal consent to proceed.  History of Present Illness   [name] is an 89 year old female who presents for follow-up of low blood count.  She experiences leg swelling and blisters. She had a previous ankle fracture from slipping on a hardwood floor, which no longer causes pain. However, she has toe pain, suspected to be gout or cellulitis by an urgent care provider, and was prescribed antibiotics and an increased dose  of prednisone .  She started taking Lasix for fluid retention on Saturday but has not noticed an increase in urination. She is concerned that the swelling has not reduced significantly.  She has bruising on her legs.  Her blood work today shows a stable white blood count of 3.2 and an ANC of 1.6. She has not required a shot for her low blood count for the past two months. She had cold symptoms two weeks ago, which resolved after a few days.  She lives independently at home and maintains a social life, going out with friends when possible. She has not yet received a flu shot this season.      Review of Systems  Constitutional:  Negative for chills, diaphoresis, fever, malaise/fatigue and weight loss.  HENT:  Negative for nosebleeds and sore throat.   Eyes:  Negative for double vision.  Respiratory:  Negative for cough, hemoptysis, sputum production, shortness of breath and wheezing.   Cardiovascular:  Negative for chest pain, palpitations and orthopnea.  Gastrointestinal:  Negative for blood in stool, constipation, diarrhea, heartburn, melena, nausea and vomiting.  Genitourinary:  Negative for dysuria, frequency and urgency.  Musculoskeletal:  Negative for back pain and joint pain.  Skin:  Negative for itching.  Neurological:  Positive for tingling. Negative for dizziness, focal weakness, weakness and headaches.  Endo/Heme/Allergies:  Bruises/bleeds easily.  Psychiatric/Behavioral:  Negative for depression. The patient is nervous/anxious. The patient does not have insomnia.      MEDICAL HISTORY:  Past Medical History:  Diagnosis Date   Breast cancer [facility]) 1985   bilateral mastectomies   Depression    1 time specific situation that she did not know how to deal wiht   Diverticulosis    Hiatal hernia  History of Bell's palsy    History of colon polyps    History of COVID-19    History of nephrolithiasis    History of pancreatitis    Hyperlipidemia    Hypertension, essential  01/10/2023   IBS (irritable bowel syndrome)    Osteoporosis    osteopenia in neck   Skin cancer    Temporal arteritis (HCC)     SURGICAL HISTORY: Past Surgical History:  Procedure Laterality Date   AUGMENTATION MAMMAPLASTY Bilateral 2000   COLON RESECTION SIGMOID N/A 10/13/2017   Procedure: COLON RESECTION SIGMOID;  Surgeon: Rodolph Romano, MD;  Location: [facility] ORS;  Service: General;  Laterality: N/A;   COLOSTOMY N/A 10/13/2017   Procedure: COLOSTOMY;  Surgeon: Rodolph Romano, MD;  Location: [facility] ORS;  Service: General;  Laterality: N/A;   LAPAROSCOPIC CHOLECYSTECTOMY  2009   MASTECTOMY  1983   bilateral    SOCIAL HISTORY: Social History   Socioeconomic History   Marital status: Widowed    Spouse name: Not on file   Number of children: 2   Years of education: college   Highest education level: Not on file  Occupational History   Occupation: public relations account executive   Occupation: Retired  Tobacco Use   Smoking status: Former    Current packs/day: 0.00    Average packs/day: 0.3 packs/day for 30.0 years (7.5 ttl pk-yrs)    Types: Cigarettes    Start date: 05/18/1975    Quit date: 05/17/2005    Years since quitting: 18.8   Smokeless tobacco: Never   Tobacco comments:    quit 11 years ago  Vaping Use   Vaping status: Never Used  Substance and Sexual Activity   Alcohol use: No    Alcohol/week: 0.0 standard drinks of alcohol   Drug use: No   Sexual activity: Not Currently  Other Topics Concern   Not on file  Social History Narrative   Patient lives at home alone.    Patient is retired.    Patient has some college.    Patient has 2 children.   Social Drivers of Corporate Investment Banker Strain: Low Risk  (02/14/2024)   Received from [facility]   Overall Financial Resource Strain (CARDIA)    Difficulty of Paying Living Expenses: Not hard at all  Food Insecurity: No Food Insecurity (02/14/2024)   Received from [facility]    Hunger Vital Sign    Within the past 12 months, you worried that your food would run out before you got the money to buy more.: Never true    Within the past 12 months, the food you bought just didn't last and you didn't have money to get more.: Never true  Transportation Needs: No Transportation Needs (02/14/2024)   Received from [facility] - Transportation    In the past 12 months, has lack of transportation kept you from medical appointments or from getting medications?: No    Lack of Transportation (Non-Medical): No  Physical Activity: Not on file  Stress: Not on file  Social Connections: Socially Isolated (01/16/2024)   Social Connection and Isolation Panel    Frequency of Communication with Friends and Family: Never    Frequency of Social Gatherings with Friends and Family: Never    Attends Religious Services: Never    Database Administrator or Organizations: No    Attends Banker Meetings: Never    Marital Status: Widowed  Intimate Partner Violence: Not  At Risk (01/16/2024)   Humiliation, Afraid, Rape, and Kick questionnaire    Fear of Current or Ex-Partner: No    Emotionally Abused: No    Physically Abused: No    Sexually Abused: No    FAMILY HISTORY: Family History  Problem Relation Age of Onset   Colon cancer Mother 18   Scleroderma Father 59   Alcoholism Brother    Stomach cancer Neg Hx     ALLERGIES:  is allergic to morphine , benzalkonium chloride, escitalopram, hydralazine, neomycin-bacitracin zn-polymyx, and bacitracin-neomycin-polymyxin.  MEDICATIONS:  Current Outpatient Medications  Medication Sig Dispense Refill   acetaminophen  (TYLENOL ) 325 MG tablet Take 2 tablets (650 mg total) by mouth every 6 (six) hours as needed for mild pain (pain score 1-3), fever or headache.     ALPRAZolam  (XANAX ) 0.25 MG tablet Take 1 tablet (0.25 mg total) by mouth 2 (two) times daily as needed for anxiety. 10 tablet 0   amLODipine   (NORVASC ) 10 MG tablet Take 10 mg by mouth daily.     Ascorbic Acid  (VITAMIN C ) 1000 MG tablet Take 1,000 mg by mouth daily.     bisacodyl  (DULCOLAX) 10 MG suppository Place 1 suppository (10 mg total) rectally daily as needed for severe constipation.     Cholecalciferol  (VITAMIN D3) 2000 units capsule Take 2,000 Units by mouth daily.     Cyanocobalamin  (B-12) 1000 MCG TBCR Take 1,000 mcg by mouth daily.     docusate sodium  (COLACE) 50 MG capsule Take 50 mg by mouth 2 (two) times daily.     polyethylene glycol (MIRALAX  / GLYCOLAX ) 17 g packet Take 17 g by mouth daily. (Patient taking differently: Take 17 g by mouth daily as needed.)     predniSONE  (DELTASONE ) 5 MG tablet Take 5 mg by mouth daily.     traMADol  (ULTRAM ) 50 MG tablet Take 1 tablet (50 mg total) by mouth every 6 (six) hours as needed for severe pain (pain score 7-10) or moderate pain (pain score 4-6). 30 tablet 0   senna-docusate (SENOKOT-S) 8.6-50 MG tablet Take 2 tablets by mouth 2 (two) times daily as needed for moderate constipation. (Patient not taking: Reported on 04/02/2024)     No current facility-administered medications for this visit.    PHYSICAL EXAMINATION: ECOG PERFORMANCE STATUS: 0 - Asymptomatic  Vitals:   04/02/24 1419 04/02/24 1439  BP: (!) 143/54 (!) 136/52  Pulse: 83   Resp: 18   Temp: (!) 96.4 F (35.8 C)   SpO2: 100%    Filed Weights   04/02/24 1419  Weight: 146 lb (66.2 kg)    Physical Exam Constitutional:      Comments: Alone.  HENT:     Head: Normocephalic and atraumatic.     Mouth/Throat:     Pharynx: No oropharyngeal exudate.  Eyes:     Pupils: Pupils are equal, round, and reactive to light.  Cardiovascular:     Rate and Rhythm: Normal rate and regular rhythm.  Pulmonary:     Effort: No respiratory distress.     Breath sounds: No wheezing.  Abdominal:     General: Bowel sounds are normal. There is no distension.     Palpations: Abdomen is soft. There is no mass.     Tenderness:  There is no abdominal tenderness. There is no guarding or rebound.     Comments: Colostomy.  Parastomal hernia.  Musculoskeletal:        General: No tenderness. Normal range of motion.     Cervical back:  Normal range of motion and neck supple.  Skin:    General: Skin is warm.     Comments: Hyperpigmented rash noted bilateral shins.  Right arm bruising/thin skin.  Neurological:     Mental Status: She is alert and oriented to person, place, and time.  Psychiatric:        Mood and Affect: Affect normal.     Comments: Anxious.      LABORATORY DATA:  I have reviewed the data as listed Lab Results  Component Value Date   WBC 3.2 (L) 04/02/2024   HGB 14.9 04/02/2024   HCT 47.6 (H) 04/02/2024   MCV 89.6 04/02/2024   PLT 391 04/02/2024   Recent Labs    04/11/23 0847 05/15/23 1003 10/23/23 1058 01/15/24 1011 01/16/24 0618 04/02/24 1413  NA 140   < > 142 142 142 137  K 3.9   < > 3.6 4.0 3.7 4.1  CL 102   < > 104 104 107 98  CO2 28   < > 26 30 24 27   GLUCOSE 91   < > 108* 100* 103* 106*  BUN 18   < > 12 16 13  28*  CREATININE 0.87   < > 0.99 0.90 0.92 1.40*  CALCIUM 8.8*   < > 9.1 9.1 8.9 9.4  GFRNONAA >60   < > 55* >60 >60 36*  PROT 6.8  --  7.4 7.0  --   --   ALBUMIN 3.8  --  4.2 4.0  --   --   AST 23  --  27 27  --   --   ALT 16  --  18 15  --   --   ALKPHOS 98  --  85 87  --   --   BILITOT 1.3*  --  1.2 1.7*  --   --    < > = values in this interval not displayed.    RADIOGRAPHIC STUDIES: I have personally reviewed the radiological images as listed and agreed with the findings in the report. DG Ankle Complete Right Result Date: 03/11/2024 Please see detailed radiograph report in office note.    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  Patient has had growth factor for the last 2 months-when recent hospitalizations.   # Patient's WBC- today-2.3; ANC- 1.6 [patient had increased dose of  steroids prednisone -urgent care given possible gout/lightest] HOLD  zarxio  today  # Slightly elevated creatinine-likely secondary to diuretics/ordered for leg swelling as per urgent care.  Recommend follow-up with PCP  # Bilateral easy bruising of her arms/forearms-senile purpura/steroids.  # Hx of temporal arteritis->20 years on low dose prednisone -stable.  # Hx of B12 deficiency- July 2023- > 3400- recommend B12 PO three times week- stable.  # Patient is currently fairly asymptomatic from her underlying leukopenia/neutropenia-without any frequent infections/sepsis.-I think it is reasonable to space out blood work and shots to every other week.    However the patient has increased incidence of infections-we consider more frequent/early shots.Discussed with the patient's daughter [name] and also daughter-in-law-they are in agreement.  # DISPOSITION: # HOLD ZARXIO  today # every 2 weeks -cbc/zaxio x 8 times  # follow up with MD in 16 weeks-- cbc/bmp/ b12 levels-zarxio -Dr.B     All questions were answered. The patient knows to call the clinic with any problems, questions or concerns.    Tonya JONELLE Joe, MD 04/02/2024 3:11 PM

## 2024-04-02 NOTE — Progress Notes (Signed)
 Patient reports complaints of bilateral lower edema starting in August following a broken ankle. She states concerns of redness and rash on both arms that became noticeable last night. Family would like to know how she is doing following her break in treatment over the past few weeks.

## 2024-04-02 NOTE — Assessment & Plan Note (Addendum)
#   Autoimmune neutropenia- once a week- if ANC < 1.5 on- zarxio .  Patient has had growth factor for the last 2 months-when recent hospitalizations.   # Patient's WBC- today-2.3; ANC- 1.6 [patient had increased dose of steroids prednisone -urgent care given possible gout/lightest] HOLD  zarxio  today  # Slightly elevated creatinine-likely secondary to diuretics/ordered for leg swelling as per urgent care.  Recommend follow-up with PCP  # Bilateral easy bruising of her arms/forearms-senile purpura/steroids.  # Hx of temporal arteritis->20 years on low dose prednisone -stable.  # Hx of B12 deficiency- July 2023- > 3400- recommend B12 PO three times week- stable.  # Patient is currently fairly asymptomatic from her underlying leukopenia/neutropenia-without any frequent infections/sepsis.-I think it is reasonable to space out blood work and shots to every other week.    However the patient has increased incidence of infections-we consider more frequent/early shots.Discussed with the patient's daughter Mercy and also daughter-in-law-they are in agreement.  # DISPOSITION: # HOLD ZARXIO  today # every 2 weeks -cbc/zaxio x 8 times  # follow up with MD in 16 weeks-- cbc/bmp/ b12 levels-zarxio -Dr.B

## 2024-04-15 ENCOUNTER — Inpatient Hospital Stay

## 2024-04-15 ENCOUNTER — Ambulatory Visit: Admitting: Podiatry

## 2024-04-15 DIAGNOSIS — D72819 Decreased white blood cell count, unspecified: Secondary | ICD-10-CM

## 2024-04-15 DIAGNOSIS — D708 Other neutropenia: Secondary | ICD-10-CM | POA: Diagnosis not present

## 2024-04-15 LAB — CBC WITH DIFFERENTIAL (CANCER CENTER ONLY)
Abs Immature Granulocytes: 0.01 K/uL (ref 0.00–0.07)
Basophils Absolute: 0 K/uL (ref 0.0–0.1)
Basophils Relative: 1 %
Eosinophils Absolute: 0 K/uL (ref 0.0–0.5)
Eosinophils Relative: 1 %
HCT: 41.3 % (ref 36.0–46.0)
Hemoglobin: 13 g/dL (ref 12.0–15.0)
Immature Granulocytes: 1 %
Lymphocytes Relative: 49 %
Lymphs Abs: 0.8 K/uL (ref 0.7–4.0)
MCH: 28.3 pg (ref 26.0–34.0)
MCHC: 31.5 g/dL (ref 30.0–36.0)
MCV: 89.8 fL (ref 80.0–100.0)
Monocytes Absolute: 0.2 K/uL (ref 0.1–1.0)
Monocytes Relative: 13 %
Neutro Abs: 0.6 K/uL — ABNORMAL LOW (ref 1.7–7.7)
Neutrophils Relative %: 35 %
Platelet Count: 264 K/uL (ref 150–400)
RBC: 4.6 MIL/uL (ref 3.87–5.11)
RDW: 15.1 % (ref 11.5–15.5)
WBC Count: 1.6 K/uL — ABNORMAL LOW (ref 4.0–10.5)
nRBC: 0 % (ref 0.0–0.2)

## 2024-04-15 MED ORDER — FILGRASTIM-SNDZ 480 MCG/0.8ML IJ SOSY
480.0000 ug | PREFILLED_SYRINGE | Freq: Once | INTRAMUSCULAR | Status: AC
  Administered 2024-04-15: 480 ug via SUBCUTANEOUS
  Filled 2024-04-15: qty 0.8

## 2024-04-18 ENCOUNTER — Ambulatory Visit (INDEPENDENT_AMBULATORY_CARE_PROVIDER_SITE_OTHER): Admitting: Vascular Surgery

## 2024-04-18 ENCOUNTER — Encounter (INDEPENDENT_AMBULATORY_CARE_PROVIDER_SITE_OTHER): Payer: Self-pay | Admitting: Vascular Surgery

## 2024-04-18 VITALS — BP 133/60 | HR 84 | Resp 17 | Ht 66.0 in | Wt 149.4 lb

## 2024-04-18 DIAGNOSIS — E785 Hyperlipidemia, unspecified: Secondary | ICD-10-CM

## 2024-04-18 DIAGNOSIS — I890 Lymphedema, not elsewhere classified: Secondary | ICD-10-CM

## 2024-04-18 DIAGNOSIS — I10 Essential (primary) hypertension: Secondary | ICD-10-CM

## 2024-04-18 NOTE — Addendum Note (Signed)
 Addended by: ELISABETH ROUND on: 04/18/2024 02:36 PM   Modules accepted: Orders

## 2024-04-18 NOTE — Progress Notes (Signed)
 Subjective:    Patient ID: [name], female    DOB: 04-Apr-1935, 89 y.o.   MRN: [id number] Chief Complaint  Patient presents with   Follow-up    12/28/22. consult. Venous insufficiency  ref: [name]  3rd toe left foot swelling as well as both legs and left hand    [name] is an 89 yo female who presents today in follow-up for bilateral lower extremity swelling for venous insufficiency.  She was last seen on December 28, 2023.  Since that time she had a fracture to her right fibula and was in an Aircast.  She has been released by orthopedics now that she is healed.  During that time she spent a lot of time off of her feet with her legs raised due to the pain from the fracture.  Since being taken out of the Aircast she is walking around her house barefoot or using flip-flops and not resting and elevating her lower extremities.  She is also not using her conventional compression therapy.  She is not wearing compression socks as she needs to.    Review of Systems  Constitutional: Negative.   Cardiovascular:  Positive for leg swelling.  Musculoskeletal:  Positive for myalgias.  Skin:  Positive for color change.       Objective:   Physical Exam Vitals reviewed.  Constitutional:      Appearance: Normal appearance. She is normal weight.  HENT:     Head: Normocephalic.  Eyes:     Pupils: Pupils are equal, round, and reactive to light.  Cardiovascular:     Rate and Rhythm: Normal rate and regular rhythm.     Pulses: Normal pulses.     Heart sounds: Normal heart sounds.  Pulmonary:     Effort: Pulmonary effort is normal.     Breath sounds: Normal breath sounds.  Abdominal:     General: Abdomen is flat. Bowel sounds are normal.     Palpations: Abdomen is soft.  Musculoskeletal:        General: Swelling, tenderness and signs of injury present.     Right lower leg: Edema present.     Left lower leg: Edema present.     Comments: Third toe on the left foot with  skin ulceration to the bottom of her toe with toe swelling.  Started 3 weeks ago.  Skin:    General: Skin is warm and dry.     Capillary Refill: Capillary refill takes 2 to 3 seconds.  Neurological:     General: No focal deficit present.     Mental Status: She is alert and oriented to person, place, and time. Mental status is at baseline.     Comments: History of dementia with difficulties with memory  Psychiatric:        Mood and Affect: Mood normal.        Behavior: Behavior normal.        Thought Content: Thought content normal.     BP 133/60   Pulse 84   Resp 17   Ht 5' 6 (1.676 m)   Wt 149 lb 6.4 oz (67.8 kg)   BMI 24.11 kg/m   Past Medical History:  Diagnosis Date   Breast cancer (HCC) 1985   bilateral mastectomies   Depression    1 time specific situation that she did not know how to deal wiht   Diverticulosis    Hiatal hernia    History of Bell's palsy    History of  colon polyps    History of COVID-19    History of nephrolithiasis    History of pancreatitis    Hyperlipidemia    Hypertension, essential 01/10/2023   IBS (irritable bowel syndrome)    Osteoporosis    osteopenia in neck   Skin cancer    Temporal arteritis (HCC)     Social History   Socioeconomic History   Marital status: Widowed    Spouse name: Not on file   Number of children: 2   Years of education: college   Highest education level: Not on file  Occupational History   Occupation: public relations account executive   Occupation: Retired  Tobacco Use   Smoking status: Former    Current packs/day: 0.00    Average packs/day: 0.3 packs/day for 30.0 years (7.5 ttl pk-yrs)    Types: Cigarettes    Start date: 05/18/1975    Quit date: 05/17/2005    Years since quitting: 18.9   Smokeless tobacco: Never   Tobacco comments:    quit 11 years ago  Vaping Use   Vaping status: Never Used  Substance and Sexual Activity   Alcohol use: No    Alcohol/week: 0.0 standard drinks of alcohol   Drug use: No   Sexual  activity: Not Currently  Other Topics Concern   Not on file  Social History Narrative   Patient lives at home alone.    Patient is retired.    Patient has some college.    Patient has 2 children.   Social Drivers of Corporate Investment Banker Strain: Low Risk  (02/14/2024)   Received from [facility]   Overall Financial Resource Strain (CARDIA)    Difficulty of Paying Living Expenses: Not hard at all  Food Insecurity: No Food Insecurity (02/14/2024)   Received from [facility]   Hunger Vital Sign    Within the past 12 months, you worried that your food would run out before you got the money to buy more.: Never true    Within the past 12 months, the food you bought just didn't last and you didn't have money to get more.: Never true  Transportation Needs: No Transportation Needs (02/14/2024)   Received from [facility] - Transportation    In the past 12 months, has lack of transportation kept you from medical appointments or from getting medications?: No    Lack of Transportation (Non-Medical): No  Physical Activity: Not on file  Stress: Not on file  Social Connections: Socially Isolated (01/16/2024)   Social Connection and Isolation Panel    Frequency of Communication with Friends and Family: Never    Frequency of Social Gatherings with Friends and Family: Never    Attends Religious Services: Never    Database Administrator or Organizations: No    Attends Banker Meetings: Never    Marital Status: Widowed  Intimate Partner Violence: Not At Risk (01/16/2024)   Humiliation, Afraid, Rape, and Kick questionnaire    Fear of Current or Ex-Partner: No    Emotionally Abused: No    Physically Abused: No    Sexually Abused: No    Past Surgical History:  Procedure Laterality Date   AUGMENTATION MAMMAPLASTY Bilateral 2000   COLON RESECTION SIGMOID N/A 10/13/2017   Procedure: COLON RESECTION SIGMOID;  Surgeon:  Rodolph Romano, MD;  Location: [facility] OR[facility];  Service: General;  Laterality: N/A;   COLOSTOMY N/A 10/13/2017   Procedure: COLOSTOMY;  Surgeon: Rodolph Romano,  MD;  Location: [facility];  Service: General;  Laterality: N/A;   LAPAROSCOPIC CHOLECYSTECTOMY  2009   MASTECTOMY  1983   bilateral    Family History  Problem Relation Age of Onset   Colon cancer Mother 52   Scleroderma Father 37   Alcoholism Brother    Stomach cancer Neg Hx     Allergies  Allergen Reactions   Morphine  Other (See Comments)    done not work, makes me scream.   Benzalkonium Chloride Dermatitis    Other reaction(s): Unknown   Escitalopram     Deveioped a twitch   Hydralazine     Causes sx of heart pounding   Neomycin-Bacitracin Zn-Polymyx Dermatitis    Other reaction(s): Other (See Comments) REACTION: blisters skin REACTION: blisters skin REACTION: blisters skin   Bacitracin-Neomycin-Polymyxin Dermatitis    REACTION: blisters skin       Latest Ref Rng & Units 04/15/2024   11:14 AM 04/02/2024    2:13 PM 01/16/2024    6:18 AM  CBC  WBC 4.0 - 10.5 K/uL 1.6  3.2  3.0   Hemoglobin 12.0 - 15.0 g/dL 86.9  85.0  86.5   Hematocrit 36.0 - 46.0 % 41.3  47.6  41.9   Platelets 150 - 400 K/uL 264  391  220       CMP     Component Value Date/Time   NA 137 04/02/2024 1413   NA 142 10/31/2022 1426   NA 140 08/12/2012 0430   K 4.1 04/02/2024 1413   K 3.9 08/12/2012 0430   CL 98 04/02/2024 1413   CL 107 08/12/2012 0430   CO2 27 04/02/2024 1413   CO2 28 08/12/2012 0430   GLUCOSE 106 (H) 04/02/2024 1413   GLUCOSE 80 08/12/2012 0430   BUN 28 (H) 04/02/2024 1413   BUN 18 10/31/2022 1426   BUN 11 08/12/2012 0430   CREATININE 1.40 (H) 04/02/2024 1413   CREATININE 0.78 08/12/2012 0430   CALCIUM 9.4 04/02/2024 1413   CALCIUM 7.9 (L) 08/12/2012 0430   PROT 7.0 01/15/2024 1011   PROT 6.5 10/31/2022 1426   PROT 7.4 08/11/2012 1117   ALBUMIN 4.0 01/15/2024 1011   ALBUMIN 4.4 10/31/2022 1426    ALBUMIN 3.7 08/11/2012 1117   AST 27 01/15/2024 1011   AST 26 08/11/2012 1117   ALT 15 01/15/2024 1011   ALT 20 08/11/2012 1117   ALKPHOS 87 01/15/2024 1011   ALKPHOS 109 08/11/2012 1117   BILITOT 1.7 (H) 01/15/2024 1011   BILITOT 0.9 10/31/2022 1426   BILITOT 0.7 08/11/2012 1117   EGFR 53 (L) 10/31/2022 1426   GFRNONAA 36 (L) 04/02/2024 1413   GFRNONAA >60 08/12/2012 0430     No results found.     Assessment & Plan:   1. Lymphedema (Primary) Patient was last seen on 12/28/2023 for bilateral lower extremity swelling and vascular insufficiency.  Patient was recommended to follow conservative therapies such as compression socks, elevation, exercise and rest.  She has not been doing this today.  She did have a right fibular fracture 6 to 8 weeks ago which has now healed.  During that timeframe she was elevating her legs due to the fact she was in Aircast so her lower extremities were doing well.  Since coming out of the Aircast she has developed bilateral lower extremity swelling to her feet and toes and ankles primarily with discoloration and darkening of the skin to her calfs.  She denies any claudication pain to bilateral lower  extremities she is able to walk well.  However she does have a swollen third toe to her left foot.  Patient's daughter at the visit today requested podiatry referral for her toes.  I will place the referral today.  I recommend that she go back to using conservative therapies such as compression socks, rest, elevation and exercise.  I also recommend she get a good pair of shoes and not walk around the house in bare feet or flip-flops to help protect her feet and her toes from any damage.  I asked that she follow-up with her PCP to be tested for what could possibly be gout.  Gout can be in this third toe possibly as well as in her left wrist thumb area.  This may help podiatry and their evaluation as well.  2. Primary hypertension Continue antihypertensive medications  as already ordered, these medications have been reviewed and there are no changes at this time.  3. Hyperlipidemia, unspecified hyperlipidemia type Continue statin as ordered and reviewed, no changes at this time   Current Outpatient Medications on File Prior to Visit  Medication Sig Dispense Refill   acetaminophen  (TYLENOL ) 325 MG tablet Take 2 tablets (650 mg total) by mouth every 6 (six) hours as needed for mild pain (pain score 1-3), fever or headache.     ALPRAZolam  (XANAX ) 0.25 MG tablet Take 1 tablet (0.25 mg total) by mouth 2 (two) times daily as needed for anxiety. 10 tablet 0   amLODipine  (NORVASC ) 10 MG tablet Take 10 mg by mouth daily.     Ascorbic Acid  (VITAMIN C ) 1000 MG tablet Take 1,000 mg by mouth daily.     bisacodyl  (DULCOLAX) 10 MG suppository Place 1 suppository (10 mg total) rectally daily as needed for severe constipation.     Cholecalciferol  (VITAMIN D3) 2000 units capsule Take 2,000 Units by mouth daily.     Cyanocobalamin  (B-12) 1000 MCG TBCR Take 1,000 mcg by mouth daily.     docusate sodium  (COLACE) 50 MG capsule Take 50 mg by mouth 2 (two) times daily.     polyethylene glycol (MIRALAX  / GLYCOLAX ) 17 g packet Take 17 g by mouth daily. (Patient taking differently: Take 17 g by mouth daily as needed.)     predniSONE  (DELTASONE ) 5 MG tablet Take 5 mg by mouth daily.     traMADol  (ULTRAM ) 50 MG tablet Take 1 tablet (50 mg total) by mouth every 6 (six) hours as needed for severe pain (pain score 7-10) or moderate pain (pain score 4-6). 30 tablet 0   senna-docusate (SENOKOT-S) 8.6-50 MG tablet Take 2 tablets by mouth 2 (two) times daily as needed for moderate constipation. (Patient not taking: Reported on 04/02/2024)     No current facility-administered medications on file prior to visit.    There are no Patient Instructions on file for this visit. No follow-ups on file.   Gwendlyn JONELLE Shank, NP

## 2024-04-28 ENCOUNTER — Other Ambulatory Visit: Payer: Self-pay | Admitting: Internal Medicine

## 2024-04-29 ENCOUNTER — Inpatient Hospital Stay: Attending: Internal Medicine

## 2024-04-29 DIAGNOSIS — D708 Other neutropenia: Secondary | ICD-10-CM | POA: Insufficient documentation

## 2024-04-29 DIAGNOSIS — Z79899 Other long term (current) drug therapy: Secondary | ICD-10-CM | POA: Insufficient documentation

## 2024-04-29 DIAGNOSIS — D72819 Decreased white blood cell count, unspecified: Secondary | ICD-10-CM

## 2024-04-29 LAB — CBC WITH DIFFERENTIAL (CANCER CENTER ONLY)
Abs Immature Granulocytes: 0.02 K/uL (ref 0.00–0.07)
Basophils Absolute: 0 K/uL (ref 0.0–0.1)
Basophils Relative: 1 %
Eosinophils Absolute: 0 K/uL (ref 0.0–0.5)
Eosinophils Relative: 1 %
HCT: 42.2 % (ref 36.0–46.0)
Hemoglobin: 13 g/dL (ref 12.0–15.0)
Immature Granulocytes: 1 %
Lymphocytes Relative: 46 %
Lymphs Abs: 1.3 K/uL (ref 0.7–4.0)
MCH: 27.9 pg (ref 26.0–34.0)
MCHC: 30.8 g/dL (ref 30.0–36.0)
MCV: 90.6 fL (ref 80.0–100.0)
Monocytes Absolute: 0.3 K/uL (ref 0.1–1.0)
Monocytes Relative: 12 %
Neutro Abs: 1.1 K/uL — ABNORMAL LOW (ref 1.7–7.7)
Neutrophils Relative %: 39 %
Platelet Count: 271 K/uL (ref 150–400)
RBC: 4.66 MIL/uL (ref 3.87–5.11)
RDW: 14.9 % (ref 11.5–15.5)
WBC Count: 2.8 K/uL — ABNORMAL LOW (ref 4.0–10.5)
nRBC: 0 % (ref 0.0–0.2)

## 2024-04-29 MED ORDER — FILGRASTIM-SNDZ 480 MCG/0.8ML IJ SOSY
480.0000 ug | PREFILLED_SYRINGE | Freq: Once | INTRAMUSCULAR | Status: AC
  Administered 2024-04-29: 480 ug via SUBCUTANEOUS
  Filled 2024-04-29: qty 0.8

## 2024-05-13 ENCOUNTER — Inpatient Hospital Stay

## 2024-05-13 DIAGNOSIS — D708 Other neutropenia: Secondary | ICD-10-CM

## 2024-05-13 DIAGNOSIS — D72819 Decreased white blood cell count, unspecified: Secondary | ICD-10-CM

## 2024-05-13 LAB — CBC WITH DIFFERENTIAL (CANCER CENTER ONLY)
Abs Immature Granulocytes: 0.03 K/uL (ref 0.00–0.07)
Basophils Absolute: 0 K/uL (ref 0.0–0.1)
Basophils Relative: 1 %
Eosinophils Absolute: 0 K/uL (ref 0.0–0.5)
Eosinophils Relative: 1 %
HCT: 43 % (ref 36.0–46.0)
Hemoglobin: 13.4 g/dL (ref 12.0–15.0)
Immature Granulocytes: 1 %
Lymphocytes Relative: 31 %
Lymphs Abs: 0.9 K/uL (ref 0.7–4.0)
MCH: 28.2 pg (ref 26.0–34.0)
MCHC: 31.2 g/dL (ref 30.0–36.0)
MCV: 90.5 fL (ref 80.0–100.0)
Monocytes Absolute: 0.3 K/uL (ref 0.1–1.0)
Monocytes Relative: 10 %
Neutro Abs: 1.6 K/uL — ABNORMAL LOW (ref 1.7–7.7)
Neutrophils Relative %: 56 %
Platelet Count: 312 K/uL (ref 150–400)
RBC: 4.75 MIL/uL (ref 3.87–5.11)
RDW: 14.9 % (ref 11.5–15.5)
WBC Count: 2.8 K/uL — ABNORMAL LOW (ref 4.0–10.5)
nRBC: 0 % (ref 0.0–0.2)

## 2024-05-13 MED ORDER — FILGRASTIM-SNDZ 480 MCG/0.8ML IJ SOSY: 480.0000 ug | PREFILLED_SYRINGE | Freq: Once | INTRAMUSCULAR | Status: DC

## 2024-05-13 NOTE — Progress Notes (Signed)
 the parameters are for anc < 1500 patient  anc is 1600 today. Patient aware and verbalizes understanding

## 2024-05-27 ENCOUNTER — Inpatient Hospital Stay

## 2024-05-27 DIAGNOSIS — D708 Other neutropenia: Secondary | ICD-10-CM

## 2024-05-27 DIAGNOSIS — D72819 Decreased white blood cell count, unspecified: Secondary | ICD-10-CM

## 2024-05-27 LAB — CBC WITH DIFFERENTIAL (CANCER CENTER ONLY)
Abs Immature Granulocytes: 0.01 K/uL (ref 0.00–0.07)
Basophils Absolute: 0 K/uL (ref 0.0–0.1)
Basophils Relative: 1 %
Eosinophils Absolute: 0.1 K/uL (ref 0.0–0.5)
Eosinophils Relative: 3 %
HCT: 42.7 % (ref 36.0–46.0)
Hemoglobin: 13.4 g/dL (ref 12.0–15.0)
Immature Granulocytes: 0 %
Lymphocytes Relative: 50 %
Lymphs Abs: 1.2 K/uL (ref 0.7–4.0)
MCH: 28.4 pg (ref 26.0–34.0)
MCHC: 31.4 g/dL (ref 30.0–36.0)
MCV: 90.5 fL (ref 80.0–100.0)
Monocytes Absolute: 0.3 K/uL (ref 0.1–1.0)
Monocytes Relative: 13 %
Neutro Abs: 0.8 K/uL — ABNORMAL LOW (ref 1.7–7.7)
Neutrophils Relative %: 33 %
Platelet Count: 236 K/uL (ref 150–400)
RBC: 4.72 MIL/uL (ref 3.87–5.11)
RDW: 14.7 % (ref 11.5–15.5)
WBC Count: 2.3 K/uL — ABNORMAL LOW (ref 4.0–10.5)
nRBC: 0 % (ref 0.0–0.2)

## 2024-05-27 MED ORDER — FILGRASTIM-SNDZ 480 MCG/0.8ML IJ SOSY
480.0000 ug | PREFILLED_SYRINGE | Freq: Once | INTRAMUSCULAR | Status: AC
  Administered 2024-05-27: 480 ug via SUBCUTANEOUS
  Filled 2024-05-27: qty 0.8

## 2024-06-10 ENCOUNTER — Inpatient Hospital Stay

## 2024-06-10 ENCOUNTER — Inpatient Hospital Stay: Attending: Internal Medicine

## 2024-06-10 DIAGNOSIS — D708 Other neutropenia: Secondary | ICD-10-CM

## 2024-06-10 DIAGNOSIS — D72819 Decreased white blood cell count, unspecified: Secondary | ICD-10-CM

## 2024-06-10 LAB — CBC WITH DIFFERENTIAL (CANCER CENTER ONLY)
Abs Immature Granulocytes: 0.02 K/uL (ref 0.00–0.07)
Basophils Absolute: 0 K/uL (ref 0.0–0.1)
Basophils Relative: 1 %
Eosinophils Absolute: 0 K/uL (ref 0.0–0.5)
Eosinophils Relative: 2 %
HCT: 41.9 % (ref 36.0–46.0)
Hemoglobin: 13.2 g/dL (ref 12.0–15.0)
Immature Granulocytes: 1 %
Lymphocytes Relative: 48 %
Lymphs Abs: 1 K/uL (ref 0.7–4.0)
MCH: 28.3 pg (ref 26.0–34.0)
MCHC: 31.5 g/dL (ref 30.0–36.0)
MCV: 89.7 fL (ref 80.0–100.0)
Monocytes Absolute: 0.3 K/uL (ref 0.1–1.0)
Monocytes Relative: 13 %
Neutro Abs: 0.8 K/uL — ABNORMAL LOW (ref 1.7–7.7)
Neutrophils Relative %: 35 %
Platelet Count: 254 K/uL (ref 150–400)
RBC: 4.67 MIL/uL (ref 3.87–5.11)
RDW: 14 % (ref 11.5–15.5)
WBC Count: 2.2 K/uL — ABNORMAL LOW (ref 4.0–10.5)
nRBC: 0 % (ref 0.0–0.2)

## 2024-06-10 MED ORDER — FILGRASTIM-SNDZ 480 MCG/0.8ML IJ SOSY
480.0000 ug | PREFILLED_SYRINGE | Freq: Once | INTRAMUSCULAR | Status: AC
  Administered 2024-06-10: 480 ug via SUBCUTANEOUS
  Filled 2024-06-10: qty 0.8

## 2024-06-24 ENCOUNTER — Inpatient Hospital Stay

## 2024-07-08 ENCOUNTER — Inpatient Hospital Stay: Attending: Internal Medicine

## 2024-07-08 ENCOUNTER — Inpatient Hospital Stay

## 2024-07-22 ENCOUNTER — Inpatient Hospital Stay

## 2024-07-22 ENCOUNTER — Inpatient Hospital Stay: Admitting: Internal Medicine
